# Patient Record
Sex: Female | Born: 1940 | Race: White | Hispanic: No | Smoking: Never smoker
Health system: Southern US, Community
[De-identification: ages and names within clinical notes are randomized; demographics above are authoritative.]

## PROBLEM LIST (undated history)

## (undated) DIAGNOSIS — J449 Chronic obstructive pulmonary disease, unspecified: Secondary | ICD-10-CM

## (undated) DIAGNOSIS — I509 Heart failure, unspecified: Secondary | ICD-10-CM

## (undated) DIAGNOSIS — C189 Malignant neoplasm of colon, unspecified: Secondary | ICD-10-CM

## (undated) DIAGNOSIS — I5030 Unspecified diastolic (congestive) heart failure: Secondary | ICD-10-CM

## (undated) DIAGNOSIS — I10 Essential (primary) hypertension: Secondary | ICD-10-CM

## (undated) DIAGNOSIS — E119 Type 2 diabetes mellitus without complications: Secondary | ICD-10-CM

## (undated) DIAGNOSIS — M109 Gout, unspecified: Secondary | ICD-10-CM

## (undated) HISTORY — PX: COLON SURGERY: SHX602

## (undated) NOTE — *Deleted (*Deleted)
Pt discharging to the [facility] today. She states that she will drive herself to the [facility] in her handicap Zenaida Niece that is parked outside the hospital. IV removed. Discharge teaching provided to pt. Verbalized understanding. Wheeled to [employer] by staff.  Arlana Hove, RN

---

## 1968-03-23 HISTORY — PX: TUBAL LIGATION: SHX77

## 2018-02-16 ENCOUNTER — Other Ambulatory Visit: Payer: Self-pay

## 2018-02-16 ENCOUNTER — Emergency Department
Admission: EM | Admit: 2018-02-16 | Discharge: 2018-02-17 | Disposition: A | Discharge status: Home / Self Care | Payer: Medicare Other | Attending: Emergency Medicine | Admitting: Emergency Medicine

## 2018-02-16 DIAGNOSIS — Z09 Encounter for follow-up examination after completed treatment for conditions other than malignant neoplasm: Secondary | ICD-10-CM

## 2018-02-16 DIAGNOSIS — Z760 Encounter for issue of repeat prescription: Secondary | ICD-10-CM | POA: Insufficient documentation

## 2018-02-16 DIAGNOSIS — Z789 Other specified health status: Secondary | ICD-10-CM

## 2018-02-16 LAB — CBC WITH DIFFERENTIAL/PLATELET
Abs Immature Granulocytes: 0.04 10*3/uL (ref 0.00–0.07)
Basophils Absolute: 0 10*3/uL (ref 0.0–0.1)
Basophils Relative: 0 %
Eosinophils Absolute: 0.2 10*3/uL (ref 0.0–0.5)
Eosinophils Relative: 2 %
HCT: 34.4 % — ABNORMAL LOW (ref 36.0–46.0)
Hemoglobin: 10.5 g/dL — ABNORMAL LOW (ref 12.0–15.0)
Immature Granulocytes: 0 %
Lymphocytes Relative: 33 %
Lymphs Abs: 3.9 10*3/uL (ref 0.7–4.0)
MCH: 24.6 pg — ABNORMAL LOW (ref 26.0–34.0)
MCHC: 30.5 g/dL (ref 30.0–36.0)
MCV: 80.6 fL (ref 80.0–100.0)
Monocytes Absolute: 0.9 10*3/uL (ref 0.1–1.0)
Monocytes Relative: 7 %
Neutro Abs: 6.8 10*3/uL (ref 1.7–7.7)
Neutrophils Relative %: 58 %
Platelets: 346 10*3/uL (ref 150–400)
RBC: 4.27 MIL/uL (ref 3.87–5.11)
RDW: 15.1 % (ref 11.5–15.5)
WBC: 11.9 10*3/uL — ABNORMAL HIGH (ref 4.0–10.5)
nRBC: 0 % (ref 0.0–0.2)

## 2018-02-16 LAB — URINALYSIS, COMPLETE (UACMP) WITH MICROSCOPIC
Bacteria, UA: NONE SEEN
Bilirubin Urine: NEGATIVE
Glucose, UA: NEGATIVE mg/dL
Hgb urine dipstick: NEGATIVE
Ketones, ur: NEGATIVE mg/dL
Leukocytes, UA: NEGATIVE
Nitrite: POSITIVE — AB
Protein, ur: NEGATIVE mg/dL
Specific Gravity, Urine: 1.01 (ref 1.005–1.030)
pH: 6 (ref 5.0–8.0)

## 2018-02-16 LAB — COMPREHENSIVE METABOLIC PANEL
ALT: 12 U/L (ref 0–44)
AST: 14 U/L — ABNORMAL LOW (ref 15–41)
Albumin: 3.6 g/dL (ref 3.5–5.0)
Alkaline Phosphatase: 93 U/L (ref 38–126)
Anion gap: 9 (ref 5–15)
BUN: 41 mg/dL — ABNORMAL HIGH (ref 8–23)
CO2: 31 mmol/L (ref 22–32)
Calcium: 9 mg/dL (ref 8.9–10.3)
Chloride: 98 mmol/L (ref 98–111)
Creatinine, Ser: 1.19 mg/dL — ABNORMAL HIGH (ref 0.44–1.00)
GFR calc Af Amer: 51 mL/min — ABNORMAL LOW (ref >60)
GFR calc non Af Amer: 44 mL/min — ABNORMAL LOW (ref >60)
Glucose, Bld: 184 mg/dL — ABNORMAL HIGH (ref 70–99)
Potassium: 4.3 mmol/L (ref 3.5–5.1)
Sodium: 138 mmol/L (ref 135–145)
Total Bilirubin: 0.4 mg/dL (ref 0.3–1.2)
Total Protein: 6.9 g/dL (ref 6.5–8.1)

## 2018-02-16 LAB — GLUCOSE, CAPILLARY: GLUCOSE-CAPILLARY: 174 mg/dL — AB (ref 70–99)

## 2018-02-16 LAB — LACTIC ACID, PLASMA: Lactic Acid, Venous: 1.1 mmol/L (ref 0.5–1.9)

## 2018-02-16 LAB — LIPASE, BLOOD: Lipase: 51 U/L (ref 11–51)

## 2018-02-16 MED ORDER — FOSFOMYCIN TROMETHAMINE 3 G PO PACK
3.0000 g | PACK | Freq: Once | ORAL | Status: AC
  Administered 2018-02-16: 3 g via ORAL
  Filled 2018-02-16: qty 3

## 2018-02-16 NOTE — ED Provider Notes (Signed)
-----------------------------------------   11:27 PM on 02/16/2018 -----------------------------------------  Patient seen in conjunction with physician assistant Kern Reap.  Patient essentially drove away from her nursing facility in [place], to come to [facility] where she grew up.  Patient states they recently placed a roommate in her room who has brain damage and cries and moans all day and all night.  Patient states she cannot get any sleep they are not treating her right at the nursing facility so she decided to leave the nursing facility.  Patient requires 24-hour oxygen, has her concentrator with her.  Patient states she came to the emergency department because she thought she would need an insulin shot as she does not have any of her medications as she left the nursing home without telling him that she was leaving.  Patient states she was not held there she went in voluntarily still has a license still has a car so she decided to leave the nursing facility.  Patient admits that she does not know anyone in [place] any longer, had no plan as to what to do when she left the nursing facility but states under no circumstances will she return to the nursing facility, states she would rather go on her [name] and die before ever returning to the nursing facility.  Patient appears to have capacity to make her own medical decisions at this time.  Does not appear to be confused.  Patient's medical work-up is been largely nonrevealing besides a mild urinary tract infection we will dose a one-time dose of fosfomycin and send a urine culture.  However as the patient is on 24-hour oxygen I do not believe we can discharge her from the emergency department at this time either, as her plan is to go live in her [name], and she is wheelchair-bound and requires 24-hour oxygen.  We will have social work and PT evaluate the patient further recommendations.  I attempted to convince the patient for a  very long time to return to her nursing facility, but she is adamant that she will never return to the nursing facility.  Patient also states she has no money, and does not know if she has insurance.   Harvest Dark, MD 02/16/18 2330

## 2018-02-16 NOTE — ED Notes (Signed)
Pt requesting insulin refill. Pt A&Ox4.

## 2018-02-16 NOTE — ED Provider Notes (Signed)
[facility] Emergency Department Provider Note  ____________________________________________  Time seen: Approximately 11:39 PM  I have reviewed the triage vital signs and the nursing notes.   HISTORY  Chief Complaint Medication Refill    HPI Karla Sanchez is a 77 y.o. female presents to the emergency department requesting medication refills of insulin and to receive an insulin injection in the emergency department. Patient reports that she left her nursing home facility in [place], [place] as she is frustrated with the care she has received at [facility].  Patient reports that she is sharing a room with another resident and noise has become unbearable.  Patient states that she left skilled nursing facility in her handicap [name] and drove to [facility].  Patient reports that she grew up in [facility] and would "like to die here".  Patient reports that she drove around [facility] for several hours before a Engineer, structural found her and helped escort her to the emergency department.  Patient has no recollection of how much insulin she receives daily but is cognizant of the fact that her blood glucose levels were not well controlled while residing in her skilled nursing facility.  When asked about her plan, patient states that she will find a Estate agent" and they can point her in the right direction.  Patient is currently using supplemental oxygen 24 hours a day.  Patient reports that she would rather "die in her [name]" then to return to her skilled nursing facility.  Patient's husband died 9 months ago.  Patient has 2 grown children, a daughter that lives in [place] and a son that lives in [place].   No past medical history on file.  There are no active problems to display for this patient.    Prior to Admission medications   Not on File    Allergies Codeine and Tetracyclines & related  No family history on file.  Social History Social  History   Tobacco Use  . Smoking status: Not on file  Substance Use Topics  . Alcohol use: Not on file  . Drug use: Not on file     Review of Systems  Constitutional: No fever/chills Eyes: No visual changes. No discharge ENT: No upper respiratory complaints. Cardiovascular: no chest pain. Respiratory: no cough. No SOB. Gastrointestinal: No abdominal pain.  No nausea, no vomiting.  No diarrhea.  No constipation. Genitourinary: Negative for dysuria. No hematuria Musculoskeletal: Negative for musculoskeletal pain. Skin: Negative for rash, abrasions, lacerations, ecchymosis. Neurological: Negative for headaches, focal weakness or numbness.   ____________________________________________   PHYSICAL EXAM:  VITAL SIGNS: ED Triage Vitals  Enc Vitals Group     BP 02/16/18 2115 (!) 133/59     Pulse Rate 02/16/18 2115 92     Resp 02/16/18 2115 20     Temp 02/16/18 2115 98 F (36.7 C)     Temp Source 02/16/18 2115 Oral     SpO2 02/16/18 2115 94 %     Weight 02/16/18 2114 270 lb (122.5 kg)     Height --      Head Circumference --      Peak Flow --      Pain Score 02/16/18 2114 0     Pain Loc --      Pain Edu? --      Excl. in [place]? --      Constitutional: Alert and oriented. Well appearing and in no acute distress. Eyes: Conjunctivae are normal. PERRL. EOMI. Head: Atraumatic.  ENT: Neck: No stridor.  No cervical spine tenderness to palpation. Hematological/Lymphatic/Immunilogical: No cervical lymphadenopathy. Cardiovascular: Normal rate, regular rhythm. Normal S1 and S2.  Good peripheral circulation. Respiratory: Normal respiratory effort without tachypnea or retractions. Lungs CTAB. Good air entry to the bases with no decreased or absent breath sounds. Gastrointestinal: Bowel sounds 4 quadrants. Soft and nontender to palpation. No guarding or rigidity. No palpable masses. No distention. No CVA tenderness. Musculoskeletal: Full range of motion to all extremities. No gross  deformities appreciated. Neurologic:  Normal speech and language. No gross focal neurologic deficits are appreciated.  Skin:  Skin is warm, dry and intact. No rash noted. Psychiatric: Mood and affect are normal. Speech and behavior are normal. Patient exhibits appropriate insight and judgement.   ____________________________________________   LABS (all labs ordered are listed, but only abnormal results are displayed)  Labs Reviewed  GLUCOSE, CAPILLARY - Abnormal; Notable for the following components:      Result Value   Glucose-Capillary 174 (*)    All other components within normal limits  CBC WITH DIFFERENTIAL/PLATELET - Abnormal; Notable for the following components:   WBC 11.9 (*)    Hemoglobin 10.5 (*)    HCT 34.4 (*)    MCH 24.6 (*)    All other components within normal limits  COMPREHENSIVE METABOLIC PANEL - Abnormal; Notable for the following components:   Glucose, Bld 184 (*)    BUN 41 (*)    Creatinine, Ser 1.19 (*)    AST 14 (*)    GFR calc non Af Amer 44 (*)    GFR calc Af Amer 51 (*)    All other components within normal limits  URINALYSIS, COMPLETE (UACMP) WITH MICROSCOPIC - Abnormal; Notable for the following components:   Color, Urine YELLOW (*)    APPearance CLEAR (*)    Nitrite POSITIVE (*)    All other components within normal limits  URINE CULTURE  LACTIC ACID, PLASMA  LIPASE, BLOOD  LACTIC ACID, PLASMA   ____________________________________________  EKG   ____________________________________________  RADIOLOGY   No results found.  ____________________________________________    PROCEDURES  Procedure(s) performed:    Procedures    Medications  fosfomycin (MONUROL) packet 3 g (3 g Oral Given 02/16/18 2333)     ____________________________________________   INITIAL IMPRESSION / ASSESSMENT AND PLAN / ED COURSE  Pertinent labs & imaging results that were available during my care of the patient were reviewed by me and  considered in my medical decision making (see chart for details).  Review of the [place] CSRS was performed in accordance of the [place] prior to dispensing any controlled drugs.      Assessment and Plan:  Need for follow-up by social worker Patient presents to the emergency department with concerns for obtaining medication refills.  Patient's blood glucose level was 184 in the emergency department and explained to patient that insulin is not given for blood glucose levels that low.  Given complicated past medical history, basic labs were obtained in the emergency department which were overall reassuring.  Patient's urinalysis was positive for nitrites and patient was given a single dose of fosfomycin.  Patient will stay in the emergency department overnight and social work will be consulted in the morning.  Attending Dr. Kerman Passey personally evaluated patient and agrees with plan of care.    ____________________________________________  FINAL CLINICAL IMPRESSION(S) / ED DIAGNOSES  Final diagnoses:  Need for follow-up by social worker      NEW MEDICATIONS STARTED DURING THIS VISIT:  ED Discharge Orders    None          This chart was dictated using voice recognition software/Dragon. Despite best efforts to proofread, errors can occur which can change the meaning. Any change was purely unintentional.    Lannie Fields, PA-C 02/16/18 2350    Harvest Dark, MD 02/19/18 [phone]

## 2018-02-16 NOTE — ED Triage Notes (Signed)
Pt left nursing home today and they did not give her insulin to take with her. She is here for med refill and a dose for today. States has no symptoms.

## 2018-02-17 DIAGNOSIS — Z760 Encounter for issue of repeat prescription: Secondary | ICD-10-CM | POA: Diagnosis not present

## 2018-02-17 LAB — GLUCOSE, CAPILLARY: GLUCOSE-CAPILLARY: 237 mg/dL — AB (ref 70–99)

## 2018-02-17 NOTE — ED Notes (Signed)
Pt is AOx4, VSS, she is in bed with rails upx2, on the monitor and the call bell is within reach. Pt reports that she does not have a place to stay and came here from a ALF in [place] to find a new ALF in [place]. Pt denies any pain at this time and does not show any signs of distress. We will continue to monitor the pt.

## 2018-02-17 NOTE — ED Notes (Signed)
Pt decided to leave AMA. Pt states "I just wanted to sleep and people keep coming in and out of the room." Per the pt request for food, snacks and other physical needs that the pt expressed, I and the staff were frequently called to her room by her via the call bell. Dr. Owens Shark, attempted to speak with the pt before leaving and the pt stated "I do not want to speak with you and get out of my way!" The pt packed up all of her belongings, her I.V. was removed and the pt left the facility per her demands riding on her electric wheelchair.

## 2018-02-19 LAB — URINE CULTURE

## 2019-06-17 ENCOUNTER — Emergency Department
Admission: EM | Admit: 2019-06-17 | Discharge: 2019-06-17 | Disposition: A | Discharge status: Home / Self Care | Payer: Medicare Other | Attending: Student | Admitting: Student

## 2019-06-17 ENCOUNTER — Other Ambulatory Visit: Payer: Self-pay

## 2019-06-17 ENCOUNTER — Emergency Department: Payer: Medicare Other

## 2019-06-17 DIAGNOSIS — E119 Type 2 diabetes mellitus without complications: Secondary | ICD-10-CM | POA: Insufficient documentation

## 2019-06-17 DIAGNOSIS — R0602 Shortness of breath: Secondary | ICD-10-CM

## 2019-06-17 DIAGNOSIS — J449 Chronic obstructive pulmonary disease, unspecified: Secondary | ICD-10-CM | POA: Diagnosis not present

## 2019-06-17 DIAGNOSIS — I10 Essential (primary) hypertension: Secondary | ICD-10-CM | POA: Insufficient documentation

## 2019-06-17 DIAGNOSIS — Z794 Long term (current) use of insulin: Secondary | ICD-10-CM | POA: Insufficient documentation

## 2019-06-17 HISTORY — DX: Chronic obstructive pulmonary disease, unspecified: J44.9

## 2019-06-17 HISTORY — DX: Essential (primary) hypertension: I10

## 2019-06-17 HISTORY — DX: Type 2 diabetes mellitus without complications: E11.9

## 2019-06-17 LAB — CBC WITH DIFFERENTIAL/PLATELET
Abs Immature Granulocytes: 0.02 10*3/uL (ref 0.00–0.07)
Basophils Absolute: 0 10*3/uL (ref 0.0–0.1)
Basophils Relative: 0 %
Eosinophils Absolute: 0.2 10*3/uL (ref 0.0–0.5)
Eosinophils Relative: 3 %
HCT: 32.1 % — ABNORMAL LOW (ref 36.0–46.0)
Hemoglobin: 9.2 g/dL — ABNORMAL LOW (ref 12.0–15.0)
Immature Granulocytes: 0 %
Lymphocytes Relative: 35 %
Lymphs Abs: 2.6 10*3/uL (ref 0.7–4.0)
MCH: 20.3 pg — ABNORMAL LOW (ref 26.0–34.0)
MCHC: 28.7 g/dL — ABNORMAL LOW (ref 30.0–36.0)
MCV: 70.9 fL — ABNORMAL LOW (ref 80.0–100.0)
Monocytes Absolute: 0.6 10*3/uL (ref 0.1–1.0)
Monocytes Relative: 8 %
Neutro Abs: 3.9 10*3/uL (ref 1.7–7.7)
Neutrophils Relative %: 54 %
Platelets: 391 10*3/uL (ref 150–400)
RBC: 4.53 MIL/uL (ref 3.87–5.11)
RDW: 17.3 % — ABNORMAL HIGH (ref 11.5–15.5)
WBC: 7.4 10*3/uL (ref 4.0–10.5)
nRBC: 0 % (ref 0.0–0.2)

## 2019-06-17 LAB — COMPREHENSIVE METABOLIC PANEL
ALT: 15 U/L (ref 0–44)
AST: 15 U/L (ref 15–41)
Albumin: 3.3 g/dL — ABNORMAL LOW (ref 3.5–5.0)
Alkaline Phosphatase: 100 U/L (ref 38–126)
Anion gap: 6 (ref 5–15)
BUN: 33 mg/dL — ABNORMAL HIGH (ref 8–23)
CO2: 28 mmol/L (ref 22–32)
Calcium: 8.5 mg/dL — ABNORMAL LOW (ref 8.9–10.3)
Chloride: 107 mmol/L (ref 98–111)
Creatinine, Ser: 0.82 mg/dL (ref 0.44–1.00)
GFR calc Af Amer: >60 mL/min (ref >60)
GFR calc non Af Amer: >60 mL/min (ref >60)
Glucose, Bld: 152 mg/dL — ABNORMAL HIGH (ref 70–99)
Potassium: 5.5 mmol/L — ABNORMAL HIGH (ref 3.5–5.1)
Sodium: 141 mmol/L (ref 135–145)
Total Bilirubin: 0.5 mg/dL (ref 0.3–1.2)
Total Protein: 6.5 g/dL (ref 6.5–8.1)

## 2019-06-17 LAB — BLOOD GAS, VENOUS
Acid-Base Excess: 3.8 mmol/L — ABNORMAL HIGH (ref 0.0–2.0)
Bicarbonate: 30.9 mmol/L — ABNORMAL HIGH (ref 20.0–28.0)
O2 Saturation: 78.2 %
Patient temperature: 37
pCO2, Ven: 56 mmHg (ref 44.0–60.0)
pH, Ven: 7.35 (ref 7.250–7.430)
pO2, Ven: 45 mmHg (ref 32.0–45.0)

## 2019-06-17 LAB — PROTIME-INR
INR: 0.9 (ref 0.8–1.2)
Prothrombin Time: 12.3 seconds (ref 11.4–15.2)

## 2019-06-17 LAB — APTT: aPTT: 31 seconds (ref 24–36)

## 2019-06-17 LAB — BRAIN NATRIURETIC PEPTIDE: B Natriuretic Peptide: 186 pg/mL — ABNORMAL HIGH (ref 0.0–100.0)

## 2019-06-17 LAB — TROPONIN I (HIGH SENSITIVITY): Troponin I (High Sensitivity): 10 ng/L (ref <18)

## 2019-06-17 MED ORDER — FUROSEMIDE 40 MG PO TABS
40.0000 mg | ORAL_TABLET | Freq: Once | ORAL | Status: AC
  Administered 2019-06-17: 40 mg via ORAL
  Filled 2019-06-17: qty 1

## 2019-06-17 MED ORDER — FUROSEMIDE 40 MG PO TABS: 40.0000 mg | ORAL_TABLET | Freq: Two times a day (BID) | ORAL | 0 refills | Status: DC

## 2019-06-17 MED ORDER — ALBUTEROL SULFATE (2.5 MG/3ML) 0.083% IN NEBU: 5.0000 mg | INHALATION_SOLUTION | Freq: Once | RESPIRATORY_TRACT | Status: DC

## 2019-06-17 MED ORDER — IPRATROPIUM-ALBUTEROL 0.5-2.5 (3) MG/3ML IN SOLN: 3.0000 mL | Freq: Once | RESPIRATORY_TRACT | Status: DC

## 2019-06-17 MED ORDER — IPRATROPIUM-ALBUTEROL 0.5-2.5 (3) MG/3ML IN SOLN
3.0000 mL | Freq: Once | RESPIRATORY_TRACT | Status: AC
  Administered 2019-06-17: 3 mL via RESPIRATORY_TRACT
  Filled 2019-06-17: qty 3

## 2019-06-17 MED ORDER — ALBUTEROL SULFATE HFA 108 (90 BASE) MCG/ACT IN AERS: 2.0000 | INHALATION_SPRAY | Freq: Four times a day (QID) | RESPIRATORY_TRACT | 0 refills | Status: DC | PRN

## 2019-06-17 NOTE — ED Notes (Signed)
This RN at bedside. Pt requesting to sit on side of the bed. This RN offered p[t recliner. Pt stating, "Get this IV out of me." This RN stating that we need IV access for medicine and other medical needs. Pt stating, "If you dont get this IV out of me I'm leaving." MD made aware.

## 2019-06-17 NOTE — ED Notes (Signed)
EMS refusing transport unless med necessity is changed, paperwork changed to suit, pt refuses DC v/s, reports ready to leave, denies pain

## 2019-06-17 NOTE — ED Notes (Addendum)
This RN at bedside to attempt IV access. Pt refusing this RN to use right arm and refusing RN go above mid forearm on left arm. This RN unsuccessful at IV start x2. Pt stating, "You better get an old looking nurse that has experience and knows what she is doing."

## 2019-06-17 NOTE — ED Notes (Signed)
Called [facility] for transport to [place]

## 2019-06-17 NOTE — ED Notes (Signed)
Pt taken to xray in wheelchair

## 2019-06-17 NOTE — ED Notes (Signed)
Patient refused to have another IV appropriate for CT Angio chest. Dr. Joan Mayans aware.

## 2019-06-17 NOTE — ED Triage Notes (Addendum)
Pt to ED via [facility] from [facility]. Per EMS pt c/o increasing SOB x3 days. Pt w/ hx COPD, DM and HTN. Pt denies using oxygen at home. Per EMS pt RA sats 92% and pt placed on 2L [place] with improvement to 98%. Pt also with hx colon cancer and surgery in January. Per EMS pt lung sounds diminished in lower lobes.   Upon arrival pt A&Ox4. Pt in NAD. Pt denies CP, fever and N/V/D. Pt right foot is swollen and pt states it is normal. Pt stating she has not been able to taste or smell since January.

## 2019-06-17 NOTE — ED Notes (Signed)
Pt leaving AMA. Pt IV taken out. Pt pulling off BP cuff and leads. EMS transport requested.

## 2019-06-17 NOTE — Discharge Instructions (Signed)
Please return to the ER for ANY worsening symptoms.   We have written you a prescription for your inhaler and your Lasix.   Please follow up with you regular doctor as soon as possible.

## 2019-06-17 NOTE — ED Notes (Signed)
Pt leaving via EMS AMA att  No peripheral IV in place att

## 2019-06-17 NOTE — ED Notes (Signed)
EMS here for pt - this RN with resp distress in room 7

## 2019-06-17 NOTE — ED Provider Notes (Signed)
[facility] Emergency Department Provider Note  ____________________________________________   First MD Initiated Contact with Patient 06/17/19 1552     (approximate)  I have reviewed the triage vital signs and the nursing notes.  History  Chief Complaint Shortness of Breath    HPI Karla Sanchez is a 79 y.o. female with history of diastolic HF, COPD (does not wear supplemental oxygen, not currently on any inhalers as she has run out), HTN, insulin dependent DM, s/p R and L colectomy in January for colon cancer, currently residing in a hotel, [place], who presents to the emergency department for acute on chronic shortness of breath. She states she has had ongoing SOB for years, but has had worsening recently. Patient reports symptoms started 4 to 5 days ago and have been constant since onset and progressively worsening.  Shortness of breath is worse while laying flat.  Improved with sitting up.  She denies any fevers, cough, chest pain.  She does report some swelling of her right ankle and foot, which is chronic and has been ongoing for years, typically worsened when she eats a high salt diet which she has been doing over the last several weeks (states she is living in a hotel and orders food from the nearby restaurant which is often very salty).  She denies any history of DVT or PE.  No sick contacts.  Per EMS oxygen was 92% on RA, placed on 2 L [place].  Patient states she does not wear any supplemental oxygen or use any inhalers with regards to her COPD, however on review of her recent discharge summary from [facility], it does appear that she was previously on 3 L nasal cannula and prescribed inhaler treatments. She states she has run out of her inhaler prescription.   Recently hospitalized at [facility] in January and underwent colectomy for colon cancer.  Has been living in a hotel since then as she sold her home after her husband passed away. She is working on finding more  permanent housing in the meantime.   Had [facility] in 01/2019.  Reports decreased smell and taste since then. No acute changes.   Past Medical Hx Past Medical History:  Diagnosis Date  . COPD (chronic obstructive pulmonary disease) ([place])   . Diabetes mellitus without complication ([place])   . Hypertension     Problem List There are no problems to display for this patient.   Past Surgical Hx Past Surgical History:  Procedure Laterality Date  . COLON SURGERY      Medications Insulin Antihypertensive  Allergies Codeine and Tetracyclines & related  Family Hx History reviewed. No pertinent family history.  Social Hx Social History   Tobacco Use  . Smoking status: Never Smoker  . Smokeless tobacco: Never Used  Substance Use Topics  . Alcohol use: Not Currently  . Drug use: Never     Review of Systems  Constitutional: Negative for fever. Negative for chills. Eyes: Negative for visual changes. ENT: Negative for sore throat. Cardiovascular: Negative for chest pain. Respiratory: + for shortness of breath. Gastrointestinal: Negative for nausea. Negative for vomiting.  Genitourinary: Negative for dysuria. Musculoskeletal: Negative for leg swelling. Skin: Negative for rash. Neurological: Negative for headaches.   Physical Exam  Vital Signs: ED Triage Vitals  Enc Vitals Group     BP 06/17/19 1540 (!) 163/71     Pulse Rate 06/17/19 1540 71     Resp 06/17/19 1540 18     Temp 06/17/19 1540 98.3  F (36.8 C)     Temp Source 06/17/19 1540 Oral     SpO2 06/17/19 1540 100 %     Weight 06/17/19 1541 200 lb (90.7 kg)     Height 06/17/19 1541 5\' 6"  (1.676 m)     Head Circumference --      Peak Flow --      Pain Score 06/17/19 1541 0     Pain Loc --      Pain Edu? --      Excl. in [place]? --     Constitutional: Alert and oriented.  Elderly appearing. Head: Normocephalic. Atraumatic. Eyes: Conjunctivae clear, sclera anicteric. Pupils equal and symmetric. Nose: No masses  or lesions. No congestion or rhinorrhea. Mouth/Throat: Wearing mask.  Neck: No stridor. Trachea midline.  Cardiovascular: Normal rate, regular rhythm. Extremities well perfused. Respiratory: Normal respiratory effort.  Lungs seemingly clear, though exam limited by body habitus.  Slightly decreased at the bases.  On 2 L nasal cannula satting 100%. Gastrointestinal: Soft. Non-distended.  Overweight. Genitourinary: Deferred. Musculoskeletal: Pitting edema of the right ankle and foot, which she states is chronic and unchanged.  No calf pain or asymmetry. Neurologic:  Normal speech and language. No gross focal or lateralizing neurologic deficits are appreciated.  Skin: Skin is warm, dry and intact. No rash noted. Psychiatric: Mood and affect are appropriate for situation. Very argumentative and emotionally labile. Denies SI.   EKG  Personally reviewed and interpreted by myself.   Rate: 75 Rhythm: sinus Axis: LAD Intervals: 1st degree block PR 209 ms, QTc 500 ms RBBB, LAFB No STEMI    Radiology  CXR IMPRESSION: 1. No acute cardiopulmonary process. 2. Cardiomegaly. 3. Dilated pulmonary arteries which can be seen in patients with elevated pulmonary artery pressures.     Procedures  Procedure(s) performed (including critical care):  Procedures   Initial Impression / Assessment and Plan / MDM / ED Course  79 y.o. female who presents to the ED for acute on chronic shortness of breath, as above  Ddx: COPD exacerbation, heart failure exacerbation, pulmonary infection, PE, anemia  Will plan for labs, EKG, imaging  5:42 PM Labs thus far unremarkable, negative troponin, BNP unremarkable. Minimally elevated K at 5.5, will give albuterol treatment and dose of home Lasix. Anemia near baseline, no acute changes. Discussed the need to r/o PE given her SOB and cancer history. She adamantly refuses the additional IV (only has a PIV in hand currently) necessary for CT PE contrast.  Discussed with NM who states they are able to perform the perfusion portion of VQ scan to evaluate for PE with her current hand IV. Patient is agreeable to this.   Patient now stating she does not want to return to the hotel she has been living in. She is requesting assistance w/ housing with social work and physical therapy. She understands this can be a lengthy process and is agreeable. At this time, awaiting VQ scan. Otherwise, if no indication for hospitalization will place a social work and physical therapy consult for safe disposition planning.  6:09 PM Patient now demanding to have her PIV removed. Explained the need for the PIV for VQ scan as discussed previously. She states "I do not have a blood clot I've been SOB for years." Explained the need to r/o PE given her cancer history. She again declines. She demands an inhaler and an Rx for Lasix as she has run out of both (takes 40 mg BID, confirmed on chart review) and then requests  discharge.   I asked if she still wanted assistance w/ housing. She states she wants to return to the hotel, she has already paid for tonight and has the funds to continue while figuring out a more permanent option. She declines SW/CM assistance.  She then demands a "prescription cream for her tailbone pain" - states she has pain in this area from sitting.  I asked to examine her tailbone but she adamantly refuses this.  I explained I am unsure what kind a prescription to provide her if I am unable to evaluate her.  She then states "well then I guess there is no use in a prescription anyways" and now declines any prescriptions for this.  She states "give me an inhaler, a sip of water, and Lasix and I'm going to leave".  Then demands assistance with a ride back to her hotel as she came via EMS.   I did explain to her that we still have not been able to rule out a PE and she would need to leave AMA.  She voices understanding of this and states "I leave AMA all the time".   She understands the life-threatening nature of PE, and that leaving AMA could result in worsening symptoms, including worsening respiratory status and ultimately death.  She voices understanding of this and continues to desire to leave AMA.  We will provide her with a prescription for albuterol inhaler and Lasix. She elects to leave AMA. Explained she may return at any time for re-evaluation and continued work up and advised follow up with her PCP.   _______________________________   As part of my medical decision making I have reviewed available labs, radiology tests, reviewed old records.  Final Clinical Impression(s) / ED Diagnosis  Final diagnoses:  SOB (shortness of breath)       Note:  This document was prepared using Dragon voice recognition software and may include unintentional dictation errors.   Lilia Pro., MD 06/17/19 2117

## 2019-07-09 ENCOUNTER — Emergency Department: Payer: Medicare Other

## 2019-07-09 ENCOUNTER — Inpatient Hospital Stay
Admission: EM | Admit: 2019-07-09 | Discharge: 2019-07-13 | DRG: 190 | Disposition: A | Discharge status: Home Health | Payer: Medicare Other | Attending: Internal Medicine | Admitting: Internal Medicine

## 2019-07-09 ENCOUNTER — Other Ambulatory Visit: Payer: Self-pay

## 2019-07-09 DIAGNOSIS — Z7189 Other specified counseling: Secondary | ICD-10-CM | POA: Diagnosis not present

## 2019-07-09 DIAGNOSIS — Z885 Allergy status to narcotic agent status: Secondary | ICD-10-CM | POA: Diagnosis not present

## 2019-07-09 DIAGNOSIS — J441 Chronic obstructive pulmonary disease with (acute) exacerbation: Principal | ICD-10-CM

## 2019-07-09 DIAGNOSIS — Z993 Dependence on wheelchair: Secondary | ICD-10-CM

## 2019-07-09 DIAGNOSIS — E119 Type 2 diabetes mellitus without complications: Secondary | ICD-10-CM

## 2019-07-09 DIAGNOSIS — D638 Anemia in other chronic diseases classified elsewhere: Secondary | ICD-10-CM | POA: Diagnosis present

## 2019-07-09 DIAGNOSIS — I5032 Chronic diastolic (congestive) heart failure: Secondary | ICD-10-CM

## 2019-07-09 DIAGNOSIS — E1169 Type 2 diabetes mellitus with other specified complication: Secondary | ICD-10-CM | POA: Diagnosis not present

## 2019-07-09 DIAGNOSIS — Z590 Homelessness: Secondary | ICD-10-CM

## 2019-07-09 DIAGNOSIS — M1A9XX0 Chronic gout, unspecified, without tophus (tophi): Secondary | ICD-10-CM | POA: Diagnosis present

## 2019-07-09 DIAGNOSIS — Z9049 Acquired absence of other specified parts of digestive tract: Secondary | ICD-10-CM

## 2019-07-09 DIAGNOSIS — D649 Anemia, unspecified: Secondary | ICD-10-CM | POA: Diagnosis not present

## 2019-07-09 DIAGNOSIS — F419 Anxiety disorder, unspecified: Secondary | ICD-10-CM | POA: Diagnosis present

## 2019-07-09 DIAGNOSIS — E1159 Type 2 diabetes mellitus with other circulatory complications: Secondary | ICD-10-CM

## 2019-07-09 DIAGNOSIS — E669 Obesity, unspecified: Secondary | ICD-10-CM | POA: Diagnosis present

## 2019-07-09 DIAGNOSIS — I5033 Acute on chronic diastolic (congestive) heart failure: Secondary | ICD-10-CM | POA: Diagnosis present

## 2019-07-09 DIAGNOSIS — E1165 Type 2 diabetes mellitus with hyperglycemia: Secondary | ICD-10-CM | POA: Diagnosis present

## 2019-07-09 DIAGNOSIS — R600 Localized edema: Secondary | ICD-10-CM

## 2019-07-09 DIAGNOSIS — Z881 Allergy status to other antibiotic agents status: Secondary | ICD-10-CM

## 2019-07-09 DIAGNOSIS — I5031 Acute diastolic (congestive) heart failure: Secondary | ICD-10-CM | POA: Diagnosis not present

## 2019-07-09 DIAGNOSIS — N179 Acute kidney failure, unspecified: Secondary | ICD-10-CM | POA: Diagnosis present

## 2019-07-09 DIAGNOSIS — I10 Essential (primary) hypertension: Secondary | ICD-10-CM | POA: Diagnosis not present

## 2019-07-09 DIAGNOSIS — Z66 Do not resuscitate: Secondary | ICD-10-CM | POA: Diagnosis present

## 2019-07-09 DIAGNOSIS — Z794 Long term (current) use of insulin: Secondary | ICD-10-CM | POA: Diagnosis not present

## 2019-07-09 DIAGNOSIS — Z85038 Personal history of other malignant neoplasm of large intestine: Secondary | ICD-10-CM | POA: Diagnosis not present

## 2019-07-09 DIAGNOSIS — R609 Edema, unspecified: Secondary | ICD-10-CM | POA: Diagnosis not present

## 2019-07-09 DIAGNOSIS — Z79899 Other long term (current) drug therapy: Secondary | ICD-10-CM | POA: Diagnosis not present

## 2019-07-09 DIAGNOSIS — Z9114 Patient's other noncompliance with medication regimen: Secondary | ICD-10-CM | POA: Diagnosis not present

## 2019-07-09 DIAGNOSIS — I110 Hypertensive heart disease with heart failure: Secondary | ICD-10-CM | POA: Diagnosis present

## 2019-07-09 DIAGNOSIS — E139 Other specified diabetes mellitus without complications: Secondary | ICD-10-CM

## 2019-07-09 DIAGNOSIS — Z6832 Body mass index (BMI) 32.0-32.9, adult: Secondary | ICD-10-CM | POA: Diagnosis not present

## 2019-07-09 DIAGNOSIS — Z515 Encounter for palliative care: Secondary | ICD-10-CM | POA: Diagnosis not present

## 2019-07-09 DIAGNOSIS — Z20822 Contact with and (suspected) exposure to covid-19: Secondary | ICD-10-CM | POA: Diagnosis present

## 2019-07-09 DIAGNOSIS — M109 Gout, unspecified: Secondary | ICD-10-CM | POA: Diagnosis not present

## 2019-07-09 HISTORY — DX: Malignant neoplasm of colon, unspecified: C18.9

## 2019-07-09 HISTORY — DX: Gout, unspecified: M10.9

## 2019-07-09 HISTORY — DX: Unspecified diastolic (congestive) heart failure: I50.30

## 2019-07-09 LAB — CBC WITH DIFFERENTIAL/PLATELET
Abs Immature Granulocytes: 0.02 10*3/uL (ref 0.00–0.07)
Basophils Absolute: 0 10*3/uL (ref 0.0–0.1)
Basophils Relative: 0 %
Eosinophils Absolute: 0.3 10*3/uL (ref 0.0–0.5)
Eosinophils Relative: 4 %
HCT: 31.9 % — ABNORMAL LOW (ref 36.0–46.0)
Hemoglobin: 8.9 g/dL — ABNORMAL LOW (ref 12.0–15.0)
Immature Granulocytes: 0 %
Lymphocytes Relative: 37 %
Lymphs Abs: 3 10*3/uL (ref 0.7–4.0)
MCH: 19.7 pg — ABNORMAL LOW (ref 26.0–34.0)
MCHC: 27.9 g/dL — ABNORMAL LOW (ref 30.0–36.0)
MCV: 70.6 fL — ABNORMAL LOW (ref 80.0–100.0)
Monocytes Absolute: 0.7 10*3/uL (ref 0.1–1.0)
Monocytes Relative: 8 %
Neutro Abs: 4.1 10*3/uL (ref 1.7–7.7)
Neutrophils Relative %: 51 %
Platelets: 341 10*3/uL (ref 150–400)
RBC: 4.52 MIL/uL (ref 3.87–5.11)
RDW: 17.5 % — ABNORMAL HIGH (ref 11.5–15.5)
WBC: 8.1 10*3/uL (ref 4.0–10.5)
nRBC: 0 % (ref 0.0–0.2)

## 2019-07-09 LAB — BLOOD GAS, VENOUS
Acid-Base Excess: 4.2 mmol/L — ABNORMAL HIGH (ref 0.0–2.0)
Bicarbonate: 31.1 mmol/L — ABNORMAL HIGH (ref 20.0–28.0)
O2 Saturation: 73.9 %
Patient temperature: 37
pCO2, Ven: 55 mmHg (ref 44.0–60.0)
pH, Ven: 7.36 (ref 7.250–7.430)
pO2, Ven: 41 mmHg (ref 32.0–45.0)

## 2019-07-09 LAB — COMPREHENSIVE METABOLIC PANEL
ALT: 14 U/L (ref 0–44)
AST: 15 U/L (ref 15–41)
Albumin: 3.1 g/dL — ABNORMAL LOW (ref 3.5–5.0)
Alkaline Phosphatase: 96 U/L (ref 38–126)
Anion gap: 5 (ref 5–15)
BUN: 31 mg/dL — ABNORMAL HIGH (ref 8–23)
CO2: 29 mmol/L (ref 22–32)
Calcium: 8.1 mg/dL — ABNORMAL LOW (ref 8.9–10.3)
Chloride: 106 mmol/L (ref 98–111)
Creatinine, Ser: 1.1 mg/dL — ABNORMAL HIGH (ref 0.44–1.00)
GFR calc Af Amer: 55 mL/min — ABNORMAL LOW (ref >60)
GFR calc non Af Amer: 48 mL/min — ABNORMAL LOW (ref >60)
Glucose, Bld: 126 mg/dL — ABNORMAL HIGH (ref 70–99)
Potassium: 4.7 mmol/L (ref 3.5–5.1)
Sodium: 140 mmol/L (ref 135–145)
Total Bilirubin: 0.3 mg/dL (ref 0.3–1.2)
Total Protein: 6.3 g/dL — ABNORMAL LOW (ref 6.5–8.1)

## 2019-07-09 LAB — IRON AND TIBC
Iron: 17 ug/dL — ABNORMAL LOW (ref 28–170)
Saturation Ratios: 3 % — ABNORMAL LOW (ref 10.4–31.8)
TIBC: 526 ug/dL — ABNORMAL HIGH (ref 250–450)
UIBC: 509 ug/dL

## 2019-07-09 LAB — BRAIN NATRIURETIC PEPTIDE: B Natriuretic Peptide: 260 pg/mL — ABNORMAL HIGH (ref 0.0–100.0)

## 2019-07-09 LAB — RETICULOCYTES
Immature Retic Fract: 26.6 % — ABNORMAL HIGH (ref 2.3–15.9)
RBC.: 4.89 MIL/uL (ref 3.87–5.11)
Retic Count, Absolute: 69.9 10*3/uL (ref 19.0–186.0)
Retic Ct Pct: 1.4 % (ref 0.4–3.1)

## 2019-07-09 LAB — FERRITIN: Ferritin: 3 ng/mL — ABNORMAL LOW (ref 11–307)

## 2019-07-09 LAB — GLUCOSE, CAPILLARY: Glucose-Capillary: 283 mg/dL — ABNORMAL HIGH (ref 70–99)

## 2019-07-09 LAB — FOLATE: Folate: 7.9 ng/mL (ref >5.9)

## 2019-07-09 LAB — URIC ACID: Uric Acid, Serum: 7.3 mg/dL — ABNORMAL HIGH (ref 2.5–7.1)

## 2019-07-09 LAB — TROPONIN I (HIGH SENSITIVITY): Troponin I (High Sensitivity): 12 ng/L (ref <18)

## 2019-07-09 LAB — POC SARS CORONAVIRUS 2 AG: SARS Coronavirus 2 Ag: NEGATIVE

## 2019-07-09 MED ORDER — SODIUM CHLORIDE 0.9 % IV SOLN: 250.0000 mL | INTRAVENOUS | Status: DC | PRN

## 2019-07-09 MED ORDER — PREDNISONE 20 MG PO TABS
40.0000 mg | ORAL_TABLET | Freq: Every day | ORAL | Status: DC
  Administered 2019-07-12 – 2019-07-13 (×2): 40 mg via ORAL
  Filled 2019-07-09 (×2): qty 2

## 2019-07-09 MED ORDER — METHYLPREDNISOLONE SODIUM SUCC 40 MG IJ SOLR
40.0000 mg | Freq: Three times a day (TID) | INTRAMUSCULAR | Status: AC
  Administered 2019-07-10 – 2019-07-11 (×5): 40 mg via INTRAVENOUS
  Filled 2019-07-09 (×7): qty 1

## 2019-07-09 MED ORDER — IOHEXOL 350 MG/ML SOLN
75.0000 mL | Freq: Once | INTRAVENOUS | Status: AC | PRN
  Administered 2019-07-09: 75 mL via INTRAVENOUS

## 2019-07-09 MED ORDER — METOPROLOL TARTRATE 50 MG PO TABS
50.0000 mg | ORAL_TABLET | Freq: Two times a day (BID) | ORAL | Status: DC
  Administered 2019-07-10 – 2019-07-13 (×7): 50 mg via ORAL
  Filled 2019-07-09 (×8): qty 1

## 2019-07-09 MED ORDER — METHYLPREDNISOLONE SODIUM SUCC 125 MG IJ SOLR
125.0000 mg | Freq: Once | INTRAMUSCULAR | Status: AC
  Administered 2019-07-09: 125 mg via INTRAVENOUS
  Filled 2019-07-09: qty 2

## 2019-07-09 MED ORDER — IPRATROPIUM-ALBUTEROL 0.5-2.5 (3) MG/3ML IN SOLN
3.0000 mL | Freq: Four times a day (QID) | RESPIRATORY_TRACT | Status: DC
  Administered 2019-07-09 – 2019-07-10 (×2): 3 mL via RESPIRATORY_TRACT
  Filled 2019-07-09 (×5): qty 3

## 2019-07-09 MED ORDER — IPRATROPIUM BROMIDE 0.02 % IN SOLN
0.5000 mg | Freq: Once | RESPIRATORY_TRACT | Status: AC
  Administered 2019-07-09: 0.5 mg via RESPIRATORY_TRACT
  Filled 2019-07-09: qty 2.5

## 2019-07-09 MED ORDER — ZINC OXIDE 40 % EX OINT
1.0000 | TOPICAL_OINTMENT | Freq: Once | CUTANEOUS | Status: AC
  Administered 2019-07-09: 1 via TOPICAL
  Filled 2019-07-09: qty 113

## 2019-07-09 MED ORDER — SODIUM CHLORIDE 0.9% FLUSH: 3.0000 mL | INTRAVENOUS | Status: DC | PRN

## 2019-07-09 MED ORDER — ALBUTEROL SULFATE (2.5 MG/3ML) 0.083% IN NEBU
5.0000 mg | INHALATION_SOLUTION | Freq: Once | RESPIRATORY_TRACT | Status: AC
  Administered 2019-07-09: 5 mg via RESPIRATORY_TRACT
  Filled 2019-07-09: qty 6

## 2019-07-09 MED ORDER — INSULIN GLARGINE 100 UNIT/ML ~~LOC~~ SOLN
20.0000 [IU] | Freq: Every day | SUBCUTANEOUS | Status: DC
  Administered 2019-07-10 – 2019-07-11 (×3): 20 [IU] via SUBCUTANEOUS
  Filled 2019-07-09 (×5): qty 0.2

## 2019-07-09 MED ORDER — FUROSEMIDE 40 MG PO TABS
40.0000 mg | ORAL_TABLET | Freq: Two times a day (BID) | ORAL | Status: DC
  Administered 2019-07-10 – 2019-07-11 (×2): 40 mg via ORAL
  Filled 2019-07-09 (×4): qty 1

## 2019-07-09 MED ORDER — LORAZEPAM 2 MG/ML IJ SOLN: 2.0000 mg | Freq: Once | INTRAMUSCULAR | Status: DC

## 2019-07-09 MED ORDER — ALPRAZOLAM 0.25 MG PO TABS
0.2500 mg | ORAL_TABLET | Freq: Three times a day (TID) | ORAL | Status: DC | PRN
  Administered 2019-07-10 – 2019-07-12 (×5): 0.25 mg via ORAL
  Filled 2019-07-09 (×5): qty 1

## 2019-07-09 MED ORDER — COLCHICINE 0.6 MG PO TABS
0.3000 mg | ORAL_TABLET | Freq: Two times a day (BID) | ORAL | Status: DC
  Administered 2019-07-10 – 2019-07-13 (×7): 0.3 mg via ORAL
  Filled 2019-07-09: qty 0.5
  Filled 2019-07-09: qty 1
  Filled 2019-07-09 (×2): qty 0.5
  Filled 2019-07-09: qty 1
  Filled 2019-07-09: qty 0.5
  Filled 2019-07-09: qty 1
  Filled 2019-07-09 (×5): qty 0.5
  Filled 2019-07-09 (×2): qty 1
  Filled 2019-07-09 (×2): qty 0.5

## 2019-07-09 MED ORDER — FUROSEMIDE 10 MG/ML IJ SOLN
40.0000 mg | Freq: Once | INTRAMUSCULAR | Status: AC
  Administered 2019-07-09: 40 mg via INTRAVENOUS
  Filled 2019-07-09: qty 4

## 2019-07-09 MED ORDER — INSULIN ASPART 100 UNIT/ML ~~LOC~~ SOLN
0.0000 [IU] | Freq: Three times a day (TID) | SUBCUTANEOUS | Status: DC
  Administered 2019-07-10: 4 [IU] via SUBCUTANEOUS
  Administered 2019-07-10: 7 [IU] via SUBCUTANEOUS
  Administered 2019-07-10 – 2019-07-11 (×2): 4 [IU] via SUBCUTANEOUS
  Administered 2019-07-11: 3 [IU] via SUBCUTANEOUS
  Administered 2019-07-11: 7 [IU] via SUBCUTANEOUS
  Administered 2019-07-12: 4 [IU] via SUBCUTANEOUS
  Administered 2019-07-12: 3 [IU] via SUBCUTANEOUS
  Administered 2019-07-13: 15 [IU] via SUBCUTANEOUS
  Filled 2019-07-09 (×10): qty 1

## 2019-07-09 MED ORDER — SODIUM CHLORIDE 0.9% FLUSH
3.0000 mL | Freq: Two times a day (BID) | INTRAVENOUS | Status: DC
  Administered 2019-07-10 – 2019-07-13 (×5): 3 mL via INTRAVENOUS

## 2019-07-09 MED ORDER — COLCHICINE 0.3 MG HALF TABLET
0.3000 mg | ORAL_TABLET | Freq: Two times a day (BID) | ORAL | Status: DC
  Filled 2019-07-09 (×2): qty 1

## 2019-07-09 MED ORDER — ENOXAPARIN SODIUM 40 MG/0.4ML ~~LOC~~ SOLN
40.0000 mg | SUBCUTANEOUS | Status: DC
  Administered 2019-07-10 – 2019-07-12 (×3): 40 mg via SUBCUTANEOUS
  Filled 2019-07-09 (×4): qty 0.4

## 2019-07-09 MED ORDER — MAGNESIUM SULFATE 2 GM/50ML IV SOLN
2.0000 g | Freq: Once | INTRAVENOUS | Status: AC
  Administered 2019-07-09: 2 g via INTRAVENOUS
  Filled 2019-07-09: qty 50

## 2019-07-09 NOTE — ED Notes (Cosign Needed)
Unable to assess pulse oximetry while ambulating due to patient being wheelchair bound and having right foot swelling.

## 2019-07-09 NOTE — ED Notes (Signed)
Pt in CT.

## 2019-07-09 NOTE — ED Notes (Signed)
Pt refusing covid swab at this time

## 2019-07-09 NOTE — ED Notes (Signed)
Md at bedside. Pt complaining of problem with her "crack", pt requesting cream for this. Pt also states she has a "tumor" on the left side of her labia. MD Darl Householder at bedside to assess.

## 2019-07-09 NOTE — ED Triage Notes (Signed)
Pt to ED via [facility] from [place]. Per EMS pt has had increasing shortness of breath x2 days, p has hx of COPD, DM2, and HTN.   Pt found with O2 sats at 97% on RA. Pt able to speak in full sentences without difficulty. Pt used her inhaler with no improvements in symptoms. Pt has R foot swelling. Denies CP.  Pt is wheelchair bound.  EMS VS- 97% RA, 69 Hr, bp 194/65, cbg 201.

## 2019-07-09 NOTE — ED Notes (Signed)
Pt back from CT, pt refusing CT at this time. Pt states she is unable to lay back for exam.

## 2019-07-09 NOTE — ED Notes (Signed)
US at bedside

## 2019-07-09 NOTE — ED Notes (Signed)
Pt assisted to bathroom

## 2019-07-09 NOTE — ED Provider Notes (Signed)
[facility] EMERGENCY DEPARTMENT Provider Note   CSN: [id number] Arrival date & time: 07/09/19  1519     History Chief Complaint  Patient presents with  . Shortness of Breath    Karla Sanchez is a 79 y.o. female history of COPD, diabetes, hypertension here presenting with shortness of breath, right leg swelling.  Patient states that she is currently staying in a hotel right now as she is looking for retirement place.  Was seen about 3 weeks ago for shortness of breath and left against medical advice. At that time, she was thought to have possible CHF exacerbation.   The history is provided by the patient.       Past Medical History:  Diagnosis Date  . COPD (chronic obstructive pulmonary disease) ([place])   . Diabetes mellitus without complication ([place])   . Hypertension     There are no problems to display for this patient.   Past Surgical History:  Procedure Laterality Date  . COLON SURGERY       OB History   No obstetric history on file.     No family history on file.  Social History   Tobacco Use  . Smoking status: Never Smoker  . Smokeless tobacco: Never Used  Substance Use Topics  . Alcohol use: Not Currently  . Drug use: Never    Home Medications Prior to Admission medications   Medication Sig Start Date End Date Taking? Authorizing Provider  albuterol (VENTOLIN HFA) 108 (90 Base) MCG/ACT inhaler Inhale 2 puffs into the lungs every 6 (six) hours as needed for wheezing or shortness of breath. 06/17/19   Lilia Pro., MD  furosemide (LASIX) 40 MG tablet Take 1 tablet (40 mg total) by mouth 2 (two) times daily. 06/17/19 07/17/19  Lilia Pro., MD    Allergies    Codeine and Tetracyclines & related  Review of Systems   Review of Systems  Respiratory: Positive for shortness of breath.   All other systems reviewed and are negative.   Physical Exam Updated Vital Signs BP (!) 132/101   Pulse 81   Temp 98.2 F (36.8 C)    Resp 16   Ht 5\' 6"  (1.676 m)   Wt 90.7 kg   SpO2 95%   BMI 32.28 kg/m   Physical Exam Vitals and nursing note reviewed.  Constitutional:      Comments: tachypneic   HENT:     Head: Normocephalic.  Eyes:     Pupils: Pupils are equal, round, and reactive to light.  Cardiovascular:     Rate and Rhythm: Normal rate and regular rhythm.  Pulmonary:     Comments: Tachypneic, crackles bilateral bases, diminished upper lobes, minimal wheezing  Abdominal:     General: Bowel sounds are normal.     Palpations: Abdomen is soft.     Comments: No obvious perirectal abscess or labial abscess.  Musculoskeletal:     Cervical back: Normal range of motion and neck supple.     Comments: 1+ edema R leg with calf tenderness.  Skin:    General: Skin is warm.     Capillary Refill: Capillary refill takes less than 2 seconds.  Neurological:     General: No focal deficit present.     Mental Status: She is alert and oriented to person, place, and time.  Psychiatric:        Mood and Affect: Mood normal.        Behavior: Behavior normal.  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  Procedures Procedures (including critical care time)  Medications Ordered in ED Medications - No data to display  ED Course  I have reviewed the  triage vital signs and the nursing notes.  Pertinent labs & imaging results that were available during my care of the patient were reviewed by me and considered in my medical decision making (see chart for details).    MDM Rules/Calculators/A&P                      Karla Sanchez is a 79 y.o. female here presenting with right leg pain and shortness of breath.  Patient has a history of COPD but is not currently on oxygen.  Patient is staying in a hotel right now and states that she cannot lay flat.  Patient has diastolic heart failure per her chart at [facility].  Consider either CHF exacerbation or DVT or PE. Patient was recently seen here and refused a VQ scan at that time.  Will get labs and BNP and right leg ultrasound and CTA chest.  7:34 PM Patient unable to lay flat with CTA chest states that she gets very short of breath.  Her oxygen level is around 92%.  BNP is also slightly elevated .  I ordered nebs and steroids and magnesium and Lasix.  Patient is wheelchair-bound at baseline.  Since she is unable to lay flat even for CT, I felt that she needs to be admitted for CHF, COPD.   Final Clinical Impression(s) / ED Diagnoses Final diagnoses:  None    Rx / DC Orders ED Discharge Orders    None       Drenda Freeze, MD 07/09/19 [phone]

## 2019-07-09 NOTE — H&P (Signed)
History and Physical    Karla Sanchez [id number] DOB: 03-22-1941 DOA: 07/09/2019  PCP: Alfredia Client, MD   Patient coming from: Home - residing in hotel  I have personally briefly reviewed patient's old medical records in [place]  Chief Complaint: progress acute on chronic SOB  HPI: Karla Sanchez is a 79 y.o. female with medical history significant of diastolic HF, COPD (does not wear supplemental oxygen, not currently on any inhalers as she has run out), HTN, insulin dependent DM, s/p R and L colectomy in January for colon cancer with end-to-end anastomosis. currently residing in a hotel, [place] in by EMS,  to the emergency department for acute on chronic shortness of breath. She states she has had ongoing SOB for years, but has had worsening recently. Patient reports symptoms started 4 to 5 days ago and have been constant since onset and progressively worsening.  Shortness of breath is worse while laying flat.  Improved with sitting up.  She denies any fevers, cough, chest pain.  She does report some swelling of her right ankle and foot, which is chronic and has been ongoing for years, typically worsened when she eats a high salt diet which she has been doing over the last several weeks (states she is living in a hotel and orders food from the nearby restaurant which is often very salty).  She denies any history of DVT or PE.  No sick contacts.  Per EMS oxygen was 92% on RA, placed on 2 L [place].  Patient states she does not wear any supplemental oxygen or use any inhalers with regards to her COPD, however on review of her recent discharge summary from [facility], it does appear that she was previously on 3 L nasal cannula and prescribed inhaler treatments.   Recently hospitalized at [facility] in January and underwent colectomy for colon cancer.  Has been living in a hotel since then as she sold her home after her husband passed away. She is working on finding more  permanent housing in the meantime.     ED Course: Patient with O2 sat of 82% on presentation. Hemodynamically stable. She was SHort of breath and could not lay flat. CTA ordered but could not be done due to SOB. She was given IV solumedrol, SABA treatments, mag, furosemide. Lab significant for anemia, hyperglycemia at 126, BNP 280, Troponin #1` 12. CXR with mild pulmonary edema, COPD. EKG w/o acute changes. She is referred to [facility] for admission for continue treatment of COPD exacerbation and mild dHF.   Review of Systems: As per HPI otherwise 10 point review of systems negative. C/o soreness at the base of her spine.    Past Medical History:  Diagnosis Date  . Colon cancer [facility])    s/p right and left colectomy 2020  . COPD (chronic obstructive pulmonary disease) ([place])   . Diabetes mellitus without complication ([place])   . Diastolic heart failure ([place])   . Gout   . Hypertension     Past Surgical History:  Procedure Laterality Date  . COLON SURGERY     Soc Hx - marriage #1 92 years - widowed, marriage #2 66 years - widowed Feb 2019. She has 4 children: dtr in [place], Son in [place], son in [place].    reports that she has never smoked. She has never used smokeless tobacco. She reports previous alcohol use. She reports that she does not use drugs.  Allergies  Allergen Reactions  . Codeine Nausea Only  .  Tetracyclines & Related Other (See Comments)    No family history on file. Reports longevity in the family.  Prior to Admission medications   Medication Sig Start Date End Date Taking? Authorizing Provider  albuterol (VENTOLIN HFA) 108 (90 Base) MCG/ACT inhaler Inhale 2 puffs into the lungs every 6 (six) hours as needed for wheezing or shortness of breath. 06/17/19   Lilia Pro., MD  furosemide (LASIX) 40 MG tablet Take 1 tablet (40 mg total) by mouth 2 (two) times daily. 06/17/19 07/17/19  Lilia Pro., MD    Physical Exam: Vitals:   07/09/19 1730 07/09/19 1834 07/09/19 1835  07/09/19 2039  BP: (!) 101/51     Pulse:  85 81 87  Resp: (!) 21 19 (!) 23 16  Temp:      SpO2:  92% 92% 90%  Weight:      Height:        Constitutional: NAD, calm, comfortable Vitals:   07/09/19 1730 07/09/19 1834 07/09/19 1835 07/09/19 2039  BP: (!) 101/51     Pulse:  85 81 87  Resp: (!) 21 19 (!) 23 16  Temp:      SpO2:  92% 92% 90%  Weight:      Height:       General - morbidly obese woman sitting up in no distress Eyes: PERRL, lids and conjunctivae normal ENMT: Mucous membranes are moist. Posterior pharynx clear of any exudate or lesions. Dentition - missing several teeth, no lesions or obvious carries. Neck: normal, supple, no masses, no thyromegaly Respiratory: clear to auscultation bilaterally, no wheezing, no crackles. Normal respiratory effort. No accessory muscle use. Caveat - this is after IV steroids and SABA treatments.  Cardiovascular: Regular rate and rhythm, no murmurs / rubs / gallops. 2+ pitting edema  Right LE to mid-calf. 1+ pedal pulses. No carotid bruits.  Abdomen: morbidly obese. no tenderness, no masses palpated - exam hindered by obesity. No hepatosplenomegaly. Bowel sounds positive.  Musculoskeletal: no clubbing / cyanosis. No joint deformity upper. Enlarged Distal MTP joint both feet. Good ROM, no contractures. Decreased muscle tone.  Skin: no rashes, lesions, ulcers. No induration including view of coccyx Neurologic: CN 2-12 grossly intact. Sensation intact,  Psychiatric: Normal judgment and insight. Alert and oriented x 3. Normal mood.     Labs on Admission: I have personally reviewed following labs and imaging studies  CBC: Recent Labs  Lab 07/09/19 1526  WBC 8.1  NEUTROABS 4.1  HGB 8.9*  HCT 31.9*  MCV 70.6*  PLT [id number]   Basic Metabolic Panel: Recent Labs  Lab 07/09/19 1526  NA 140  K 4.7  CL 106  CO2 29  GLUCOSE 126*  BUN 31*  CREATININE 1.10*  CALCIUM 8.1*   GFR: Estimated Creatinine Clearance: 47.1 mL/min (A) (by C-G  formula based on SCr of 1.1 mg/dL (H)). Liver Function Tests: Recent Labs  Lab 07/09/19 1526  AST 15  ALT 14  ALKPHOS 96  BILITOT 0.3  PROT 6.3*  ALBUMIN 3.1*   No results for input(s): LIPASE, AMYLASE in the last 168 hours. No results for input(s): AMMONIA in the last 168 hours. Coagulation Profile: No results for input(s): INR, PROTIME in the last 168 hours. Cardiac Enzymes: No results for input(s): CKTOTAL, CKMB, CKMBINDEX, TROPONINI in the last 168 hours. BNP (last 3 results) No results for input(s): PROBNP in the last 8760 hours. HbA1C: No results for input(s): HGBA1C in the last 72 hours. CBG: No results for input(s): GLUCAP in  the last 168 hours. Lipid Profile: No results for input(s): CHOL, HDL, LDLCALC, TRIG, CHOLHDL, LDLDIRECT in the last 72 hours. Thyroid Function Tests: No results for input(s): TSH, T4TOTAL, FREET4, T3FREE, THYROIDAB in the last 72 hours. Anemia Panel: No results for input(s): VITAMINB12, FOLATE, FERRITIN, TIBC, IRON, RETICCTPCT in the last 72 hours. Urine analysis:    Component Value Date/Time   COLORURINE YELLOW (A) 02/16/2018 2234   APPEARANCEUR CLEAR (A) 02/16/2018 2234   LABSPEC 1.010 02/16/2018 2234   PHURINE 6.0 02/16/2018 2234   GLUCOSEU NEGATIVE 02/16/2018 2234   HGBUR NEGATIVE 02/16/2018 2234   BILIRUBINUR NEGATIVE 02/16/2018 2234   KETONESUR NEGATIVE 02/16/2018 2234   PROTEINUR NEGATIVE 02/16/2018 2234   NITRITE POSITIVE (A) 02/16/2018 2234   LEUKOCYTESUR NEGATIVE 02/16/2018 2234    Radiological Exams on Admission: US Venous Img Lower Unilateral Right (DVT)  Result Date: 07/09/2019 CLINICAL DATA:  Right leg swelling. EXAM: RIGHT LOWER EXTREMITY VENOUS DOPPLER ULTRASOUND TECHNIQUE: Gray-scale sonography with compression, as well as color and duplex ultrasound, were performed to evaluate the deep venous system(s) from the level of the common femoral vein through the popliteal and proximal calf veins. COMPARISON:  None. FINDINGS:  VENOUS Normal compressibility of the common femoral, superficial femoral, and popliteal veins, as well as the visualized calf veins. Visualized portions of profunda femoral vein and great saphenous vein unremarkable. No filling defects to suggest DVT on grayscale or color Doppler imaging. Doppler waveforms show normal direction of venous flow, normal respiratory phasicity and response to augmentation. Limited views of the contralateral common femoral vein are unremarkable. OTHER None. Limitations: none IMPRESSION: No femoropopliteal DVT nor evidence of DVT within the visualized calf veins. If clinical symptoms are inconsistent or if there are persistent or worsening symptoms, further imaging (possibly involving the iliac veins) may be warranted. Electronically Signed   By: Claudie Revering M.D.   On: 07/09/2019 16:15   DG Chest Port 1 View  Result Date: 07/09/2019 CLINICAL DATA:  [age]-year-old female with shortness of breath. EXAM: PORTABLE CHEST 1 VIEW COMPARISON:  Chest radiograph dated 06/17/2019. FINDINGS: Background of mild emphysema and chronic interstitial coarsening and bronchitic changes. There has been interval progression of interstitial prominence with reticulonodular appearance which may represent mild edema or superimposed pneumonia. Clinical correlation is recommended. There is no focal consolidation, pleural effusion, pneumothorax. Stable mild cardiomegaly. Coronary vascular calcification and atherosclerotic calcification of the aorta. There is prominence of the central pulmonary arteries suggestive of a degree of pulmonary hypertension. Clinical correlation is recommended. No acute osseous pathology. IMPRESSION: 1. Findings may represent interval development of mild edema or atypical pneumonia. Clinical correlation is recommended. 2. Stable mild cardiomegaly with findings suggestive of a degree of pulmonary hypertension. Electronically Signed   By: Anner Crete M.D.   On: 07/09/2019 15:47     EKG: Independently reviewed. Sinus rhythm - ectopic atrial, RBBB, LAFB, LVH  Assessment/Plan Active Problems:   COPD with acute exacerbation (HCC)   Acute on chronic diastolic CHF (congestive heart failure) (HCC)   DM (diabetes mellitus) ([place])   Peripheral edema   Obesity, diabetes, and hypertension syndrome (H[facility])   Gout   Anemia  (please populate well all problems here in Problem List. (For example, if patient is on BP meds at home and you resume or decide to hold them, it is a problem that needs to be her. Same for CAD, COPD, HLD and so on)   1. Pul - patient with h/o COPD. Has not been on O2 for over  a year by her report. Does use albuterol prn. Now with progressive SOB. Has had good response to IV solumedrol and SABA tx in ED. Plan Progressive unit admission  Solumedrol 40 mg q 6 x 6 doses then prednisone 40 mg daily  Duoneb treatments q 6 x 4 doses then resume albuterol. May need LABA  Spirometry  2. Cardiac - has been told she had CHF by PCP in [facility], [place]. Has been prescribed furosemide. Mild ;ul edema on CXR, mild elevation of BNP Plan Continue furosemide 40 mg bid  2 D echo to assess dHF  3. HTN- long standing problem. Plan Continue furosemide  Continue metoprolol bid  4. IDDM - patient reports taking lantus 20 qhs at home Plan Continue lantus  Low carb diet  Sliding scale  A1C ordered  5. Gout - long term problem. Patient takes colchicine at home but doesn't know dose Plan Uric acid level  Colchicine 0.3mg  bid  6. Anemia - Hgb 10.5 02/16/18, 9.2 06/17/19 and now 8.9. She is s/p colectomy.  Plan Anemia panel   F/u H/H  7. Code status - discussed with patient: DNR but ok for short term intubation if needed  8. TOC - needs to establish with local care, needs assistance with housing.  DVT prophylaxis: lovenox  Code Status: limited code: no CPR, ok for intubation ) Family Communication: patient has no emergency contact and does not want me to call any  family  Disposition Plan: TBD  When medically stable  Consults called: none  Admission status: progressive unit due to COPD and CHF  Adella Hare MD [facility] Pager [phone]  If 7PM-7AM, please contact night-coverage [email or web address] Password [facility]  07/09/2019, 9:06 PM

## 2019-07-09 NOTE — ED Notes (Signed)
Pt was given a sandwich tray and a cup of diet soda with ice.

## 2019-07-09 NOTE — ED Notes (Signed)
Pt refusing to leave vital sign monitoring on. Pt educated on importance of continuous monitoring at this time.

## 2019-07-10 DIAGNOSIS — M109 Gout, unspecified: Secondary | ICD-10-CM

## 2019-07-10 LAB — HEMOGLOBIN A1C
Hgb A1c MFr Bld: 7.3 % — ABNORMAL HIGH (ref 4.8–5.6)
Mean Plasma Glucose: 162.81 mg/dL

## 2019-07-10 LAB — SARS CORONAVIRUS 2 (TAT 6-24 HRS): SARS Coronavirus 2: NEGATIVE

## 2019-07-10 LAB — BASIC METABOLIC PANEL
Anion gap: 9 (ref 5–15)
BUN: 34 mg/dL — ABNORMAL HIGH (ref 8–23)
CO2: 26 mmol/L (ref 22–32)
Calcium: 8.5 mg/dL — ABNORMAL LOW (ref 8.9–10.3)
Chloride: 105 mmol/L (ref 98–111)
Creatinine, Ser: 1.03 mg/dL — ABNORMAL HIGH (ref 0.44–1.00)
GFR calc Af Amer: 60 mL/min — ABNORMAL LOW (ref >60)
GFR calc non Af Amer: 52 mL/min — ABNORMAL LOW (ref >60)
Glucose, Bld: 284 mg/dL — ABNORMAL HIGH (ref 70–99)
Potassium: 5.1 mmol/L (ref 3.5–5.1)
Sodium: 140 mmol/L (ref 135–145)

## 2019-07-10 LAB — GLUCOSE, CAPILLARY
Glucose-Capillary: 240 mg/dL — ABNORMAL HIGH (ref 70–99)
Glucose-Capillary: 170 mg/dL — ABNORMAL HIGH (ref 70–99)
Glucose-Capillary: 193 mg/dL — ABNORMAL HIGH (ref 70–99)
Glucose-Capillary: 215 mg/dL — ABNORMAL HIGH (ref 70–99)

## 2019-07-10 LAB — HEMOGLOBIN AND HEMATOCRIT, BLOOD
HCT: 33 % — ABNORMAL LOW (ref 36.0–46.0)
Hemoglobin: 9.3 g/dL — ABNORMAL LOW (ref 12.0–15.0)

## 2019-07-10 LAB — VITAMIN B12: Vitamin B-12: 179 pg/mL — ABNORMAL LOW (ref 180–914)

## 2019-07-10 MED ORDER — ALLOPURINOL 100 MG PO TABS
100.0000 mg | ORAL_TABLET | Freq: Every day | ORAL | Status: DC
  Administered 2019-07-11 – 2019-07-13 (×3): 100 mg via ORAL
  Filled 2019-07-10 (×5): qty 1

## 2019-07-10 NOTE — ED Notes (Signed)
Pt sleeping in room at this time. Rise and fall of chest noted.

## 2019-07-10 NOTE — ED Notes (Signed)
First attempt to call report. Nurse not available will try again.

## 2019-07-10 NOTE — Plan of Care (Signed)
  Problem: Education: Goal: Knowledge of disease or condition will improve Outcome: Progressing Goal: Knowledge of the prescribed therapeutic regimen will improve Outcome: Progressing   Problem: Education: Goal: Knowledge of General Education information will improve Description: Including pain rating scale, medication(s)/side effects and non-pharmacologic comfort measures Outcome: Progressing   Problem: Health Behavior/Discharge Planning: Goal: Ability to manage health-related needs will improve Outcome: Progressing

## 2019-07-10 NOTE — ED Notes (Signed)
Pt had to use restroom. I tried helping her to the wheel chair and was trying to place a pad in the wheelchair. She had a partial bowel movement and didn't want it all over her or the wheelchair. Pt smacked my hand and got very ill, yelling and screaming. Pt said I was the worst nurse and I didn't care about her. I told her not to hit me and to stop yelling in my face. Pt continued yelling and stated she was not being taken care of. I explained to the pt we have been in here to help you and you continue to treat [place] all very badly. The RN Tomasa Hosteller came in to take over,because she was being so rude to me. I couldn't continue without the pt becoming violent. RN Tomasa Hosteller tried to calm her down, but she was still upset. The RN has finished taking her to the restroom and is placing her back in her room at this time.

## 2019-07-10 NOTE — ED Notes (Signed)
Called pharmacy in regards to missing dose of cochicine

## 2019-07-10 NOTE — ED Notes (Signed)
Writer went into room to introduce herself to ptient. Patient very angry talking at the top of her lungs, yelling at nurse, states " those dam people did not bring her portable commode with them", it was explained to patient that she arrived via ems and that ems usually do not bring commodes from out of peoples home, they may bring medicines if its in view otherwise they usually just bring the patient and nothing else " patient continues to yell at nurse requesting to speak to charge nurse and administration. As per ed tech made administration aware, they will come talk with patient and provide service recovery. . Commode offered and will be placed at bedside. Patient instructed to utilize call light when she need to use the commode.

## 2019-07-10 NOTE — Progress Notes (Signed)
[place] [facility] [place] NAME: Karla Sanchez    MR#:  [id number]  DATE OF BIRTH:  11/09/1940  SUBJECTIVE:  CHIEF COMPLAINT:   Chief Complaint  Patient presents with  . Shortness of Breath  c/o RLE bigger than LLE and being wheel chair bound, she request placement to [facility] as she currently resides at [facility]:  Review of Systems  Constitutional: Negative for diaphoresis, fever, malaise/fatigue and weight loss.  HENT: Negative for ear discharge, ear pain, hearing loss, nosebleeds, sore throat and tinnitus.   Eyes: Negative for blurred vision and pain.  Respiratory: Negative for cough, hemoptysis, shortness of breath and wheezing.   Cardiovascular: Negative for chest pain, palpitations, orthopnea and leg swelling.  Gastrointestinal: Negative for abdominal pain, blood in stool, constipation, diarrhea, heartburn, nausea and vomiting.  Genitourinary: Negative for dysuria, frequency and urgency.  Musculoskeletal: Negative for back pain and myalgias.  Skin: Negative for itching and rash.  Neurological: Negative for dizziness, tingling, tremors, focal weakness, seizures, weakness and headaches.  Psychiatric/Behavioral: Negative for depression. The patient is not nervous/anxious.    DRUG ALLERGIES:   Allergies  Allergen Reactions  . Codeine Nausea Only  . Tetracyclines & Related Other (See Comments)   VITALS:  Blood pressure (!) 173/67, pulse 82, temperature 97.8 F (36.6 C), resp. rate 19, height 5\' 6"  (1.676 m), weight 109.8 kg, SpO2 92 %. PHYSICAL EXAMINATION:  Physical Exam HENT:     Head: Normocephalic and atraumatic.  Eyes:     Conjunctiva/sclera: Conjunctivae normal.     Pupils: Pupils are equal, round, and reactive to light.  Neck:     Thyroid: No thyromegaly.     Trachea: No tracheal deviation.  Cardiovascular:     Rate and Rhythm: Normal rate and regular rhythm.     Heart sounds: Normal heart sounds.  Pulmonary:   Effort: Pulmonary effort is normal. No respiratory distress.     Breath sounds: Normal breath sounds. No wheezing.  Chest:     Chest wall: No tenderness.  Abdominal:     General: Bowel sounds are normal. There is no distension.     Palpations: Abdomen is soft.     Tenderness: There is no abdominal tenderness.  Musculoskeletal:        General: Normal range of motion.     Cervical back: Normal range of motion and neck supple.     Comments: RLE>LLE - seems chronic  Feet:     Comments: Gouty 1st MTP joint on RLE, Skin:    General: Skin is warm and dry.     Findings: No rash.  Neurological:     Mental Status: She is alert and oriented to person, place, and time.     Cranial Nerves: No cranial nerve deficit.    LABORATORY PANEL:  Female CBC Recent Labs  Lab 07/09/19 1526 07/09/19 1526 07/10/19 0623  WBC 8.1  --   --   HGB 8.9*   < > 9.3*  HCT 31.9*   < > 33.0*  PLT 341  --   --    < > = values in this interval not displayed.   ------------------------------------------------------------------------------------------------------------------ Chemistries  Recent Labs  Lab 07/09/19 1526 07/09/19 1526 07/10/19 0623  NA 140   < > 140  K 4.7   < > 5.1  CL 106   < > 105  CO2 29   < > 26  GLUCOSE 126*   < > 284*  BUN 31*   < >  34*  CREATININE 1.10*   < > 1.03*  CALCIUM 8.1*   < > 8.5*  AST 15  --   --   ALT 14  --   --   ALKPHOS 96  --   --   BILITOT 0.3  --   --    < > = values in this interval not displayed.   RADIOLOGY:  No results found. ASSESSMENT AND PLAN:  Karla Sanchez is a 79 y.o. female with medical history significant of diastolic HF, COPD (does not wear supplemental oxygen, notcurrentlyon any inhalersas she has run out), HTN, insulin dependent DM, s/p R and L colectomy in January for colon cancer with end-to-end anastomosis. currently residing in a hotel, admitted foracute on chronicresp failure.   * Acute on chronic hypoxic resp failure - likely due  to COPD/CHF exacerbation - initially was requiring 3 liters O2 in ED but she is on RA now. - seems close to her baseline  * HTN Controlled on furosemide & metoprolol bid  * IDDM  - Continue home dose lantus - Low carb diet  - Sliding scale  - A1C 7.3  * Gout  - continue allopurinol and cochicine  * Anemia - Hgb 10.5 02/16/18, 9.2 06/17/19 and now 9.3. She is s/p colectomy.  - monitor  Patient reports being wheel chair bound  Status is: Inpatient  Remains inpatient appropriate because:Unsafe d/c plan   Dispo: The patient [facility] from: [facility]              Anticipated d/c is to: ALF              Anticipated d/c date is: 1 day              Patient currently is not medically stable to d/c.   DVT prophylaxis: Lovenox Family Communication: discussed with patient   All the records are reviewed and case discussed with Care Management/Social Worker. Management plans discussed with the patient, nursing and they are in agreement.  CODE STATUS: Partial Code  TOTAL TIME TAKING CARE OF THIS PATIENT: 35 minutes.   More than 50% of the time was spent in counseling/coordination of care: YES  POSSIBLE D/C IN 1-2 DAYS, DEPENDING ON CLINICAL CONDITION.   Max Sane M.D on 07/10/2019 at 8:57 PM  [facility]   CC: Primary care physician; Alfredia Client, MD  Note: This dictation was prepared with Dragon dictation along with smaller phrase technology. Any transcriptional errors that result from this process are unintentional.

## 2019-07-10 NOTE — ED Notes (Signed)
Pt refusing all monitoring equipment or repeat VS until the AM. Pt advised that without monitoring this RN will be unable to assess if pt is in danger of cardiac issues. Pt verbalized to this RN that cardiac issues that could result from being unmonitored include STEMI and death. Pt states that she is willing to take these risks at this time.

## 2019-07-10 NOTE — ED Notes (Signed)
Blood glucose 170

## 2019-07-10 NOTE — ED Notes (Signed)
Pt placed back in bed and cleaned. Desitin applied to pt's bottom and all lights turned out at this time.

## 2019-07-11 ENCOUNTER — Inpatient Hospital Stay (HOSPITAL_COMMUNITY)
Admit: 2019-07-11 | Discharge: 2019-07-11 | Disposition: A | Discharge status: Home / Self Care | Payer: Medicare Other | Attending: Internal Medicine | Admitting: Internal Medicine

## 2019-07-11 DIAGNOSIS — I5031 Acute diastolic (congestive) heart failure: Secondary | ICD-10-CM | POA: Diagnosis not present

## 2019-07-11 DIAGNOSIS — Z515 Encounter for palliative care: Secondary | ICD-10-CM

## 2019-07-11 DIAGNOSIS — Z7189 Other specified counseling: Secondary | ICD-10-CM

## 2019-07-11 LAB — CBC
HCT: 32.7 % — ABNORMAL LOW (ref 36.0–46.0)
Hemoglobin: 9.3 g/dL — ABNORMAL LOW (ref 12.0–15.0)
MCH: 19.8 pg — ABNORMAL LOW (ref 26.0–34.0)
MCHC: 28.4 g/dL — ABNORMAL LOW (ref 30.0–36.0)
MCV: 69.7 fL — ABNORMAL LOW (ref 80.0–100.0)
Platelets: 377 10*3/uL (ref 150–400)
RBC: 4.69 MIL/uL (ref 3.87–5.11)
RDW: 17.7 % — ABNORMAL HIGH (ref 11.5–15.5)
WBC: 10.2 10*3/uL (ref 4.0–10.5)
nRBC: 0 % (ref 0.0–0.2)

## 2019-07-11 LAB — BASIC METABOLIC PANEL
Anion gap: 9 (ref 5–15)
BUN: 55 mg/dL — ABNORMAL HIGH (ref 8–23)
CO2: 25 mmol/L (ref 22–32)
Calcium: 8.8 mg/dL — ABNORMAL LOW (ref 8.9–10.3)
Chloride: 103 mmol/L (ref 98–111)
Creatinine, Ser: 1.33 mg/dL — ABNORMAL HIGH (ref 0.44–1.00)
GFR calc Af Amer: 44 mL/min — ABNORMAL LOW (ref >60)
GFR calc non Af Amer: 38 mL/min — ABNORMAL LOW (ref >60)
Glucose, Bld: 241 mg/dL — ABNORMAL HIGH (ref 70–99)
Potassium: 5.1 mmol/L (ref 3.5–5.1)
Sodium: 137 mmol/L (ref 135–145)

## 2019-07-11 LAB — GLUCOSE, CAPILLARY
Glucose-Capillary: 218 mg/dL — ABNORMAL HIGH (ref 70–99)
Glucose-Capillary: 129 mg/dL — ABNORMAL HIGH (ref 70–99)
Glucose-Capillary: 162 mg/dL — ABNORMAL HIGH (ref 70–99)
Glucose-Capillary: 295 mg/dL — ABNORMAL HIGH (ref 70–99)

## 2019-07-11 LAB — ECHOCARDIOGRAM COMPLETE
Height: 66 in
Weight: 3873.6 oz

## 2019-07-11 MED ORDER — IPRATROPIUM-ALBUTEROL 0.5-2.5 (3) MG/3ML IN SOLN: 3.0000 mL | Freq: Four times a day (QID) | RESPIRATORY_TRACT | Status: DC | PRN

## 2019-07-11 NOTE — Progress Notes (Signed)
*  PRELIMINARY RESULTS* Echocardiogram 2D Echocardiogram has been performed.  Sherrie Sport 07/11/2019, 12:30 PM

## 2019-07-11 NOTE — TOC Initial Note (Addendum)
Transition of Care [facility]) - Initial/Assessment Note    Patient Details  Name: Karla Sanchez MRN: [id number] Date of Birth: 05/03/1940  Transition of Care [facility]) CM/SW Contact:    Victorino Dike, RN Phone Number: 07/11/2019, 2:47 PM  Clinical Narrative:                   Met with Patient in room.  She is currently stating she is looking for somewhere to live.  Her timeline is inconsistent when speaking with different people about when she sold her house and where she has been living.  She is WC bound and has a WC van.  She reports not having a license anymore and has been living in a hotel.  She reports that she has around $2100 in bank each month.    Expected Discharge Plan: [place] Barriers to Discharge: Continued Medical Work up   Patient Goals and CMS Choice     Choice offered to / list presented to : Patient  Expected Discharge Plan and Services Expected Discharge Plan: [place]   Discharge Planning Services: CM Consult Post Acute Care Choice: [place] Living arrangements for the past 2 months: Hotel/Motel                                      Prior Living Arrangements/Services Living arrangements for the past 2 months: Hotel/Motel Lives with:: Self Patient language and need for interpreter reviewed:: Yes        Need for Family Participation in Patient Care: No (Comment) Care giver support system in place?: Yes (comment) Current home services: DME(wheelchair and wheelchair [name]) Criminal Activity/Legal Involvement Pertinent to Current Situation/Hospitalization: No - Comment as needed  Activities of Daily Living Home Assistive Devices/Equipment: Wheelchair, Environmental consultant (specify type) ADL Screening (condition at time of admission) Patient's cognitive ability adequate to safely complete daily activities?: Yes Is the patient deaf or have difficulty hearing?: No Does the patient have difficulty seeing, even when  wearing glasses/contacts?: No Does the patient have difficulty concentrating, remembering, or making decisions?: No Patient able to express need for assistance with ADLs?: Yes Does the patient have difficulty dressing or bathing?: No Independently performs ADLs?: Yes (appropriate for developmental age) Does the patient have difficulty walking or climbing stairs?: No Weakness of Legs: Both Weakness of Arms/Hands: None  Permission Sought/Granted                  Emotional Assessment Appearance:: Appears stated age Attitude/Demeanor/Rapport: Engaged Affect (typically observed): Accepting Orientation: : Oriented to Self, Oriented to Place, Oriented to  Time, Oriented to Situation Alcohol / Substance Use: Not Applicable Psych Involvement: No (comment)  Admission diagnosis:  COPD exacerbation (HCC) [J44.1] Acute on chronic diastolic congestive heart failure (HCC) [I50.33] COPD with acute exacerbation ([place]) [J44.1] Patient Active Problem List   Diagnosis Date Noted  . COPD with acute exacerbation ([place]) 07/09/2019  . Acute on chronic diastolic CHF (congestive heart failure) ([place]) 07/09/2019  . DM (diabetes mellitus) ([place]) 07/09/2019  . Peripheral edema 07/09/2019  . Gout 07/09/2019  . Obesity, diabetes, and hypertension syndrome ([place]) 07/09/2019  . Anemia 07/09/2019   PCP:  Alfredia Client, MD Pharmacy:   [id number] [address], [place] [address] Phone: [phone] Fax: [phone]  Social Determinants of Health (SDOH) Interventions    Readmission Risk Interventions No flowsheet data found.

## 2019-07-11 NOTE — Progress Notes (Signed)
OT Cancellation Note  Patient Details Name: Karla Sanchez MRN: [id number] DOB: [birth year]   Cancelled Treatment:    Reason Eval/Treat Not Completed: Patient at procedure or test/ unavailable. Order received and chart reviewed. OT attempted to see pt. Upon arrival to pt room, pt with Chaplain in room for visit. Will hold OT evaluation until a later time/date as available and pt medically appropriate for OT evaluation.   Shara Blazing, M.S., OTR/L Ascom: [phone] 07/11/19, 1:39 PM   Deeanna Beightol [place] Maser 07/11/2019, 1:38 PM

## 2019-07-11 NOTE — Progress Notes (Signed)
Patent refused medications, vitals signs and weights this morning. Patient states the doctor reported she should not be awaken until 7am or 8am. Explained to patient about providing care in the hospital and taking medications at a scheduled time. Staff waited until 0600 to attempt to care for patient. Attempted to show heart failure videos to patient at 2100, patient declined to review videos at that time. Will continue to monitor and assess

## 2019-07-11 NOTE — NC FL2 (Signed)
[place] LEVEL OF CARE SCREENING TOOL     IDENTIFICATION  Patient Name: Karla Sanchez Birthdate: 05-18-1940 Sex: female Admission Date (Current Location): 07/09/2019  [place] and [place] Number:  Engineering geologist and Address:  [facility], [address] [place], [place] [address]      Provider Number: [id number]  Attending Physician Name and Address:  Max Sane, MD  Relative Name and Phone Number:       Current Level of Care: Hospital Recommended Level of Care: [place] Prior Approval Number:    Date Approved/Denied:   PASRR Number: [id number]  Discharge Plan: SNF    Current Diagnoses: Patient Active Problem List   Diagnosis Date Noted  . COPD with acute exacerbation ([place]) 07/09/2019  . Acute on chronic diastolic CHF (congestive heart failure) ([place]) 07/09/2019  . DM (diabetes mellitus) ([place]) 07/09/2019  . Peripheral edema 07/09/2019  . Gout 07/09/2019  . Obesity, diabetes, and hypertension syndrome ([place]) 07/09/2019  . Anemia 07/09/2019    Orientation RESPIRATION BLADDER Height & Weight     Self, Time, Situation, Place  Normal Continent Weight: (patient refused morning care. nurse notified.) Height:  5\' 6"  (167.6 cm)  BEHAVIORAL SYMPTOMS/MOOD NEUROLOGICAL BOWEL NUTRITION STATUS      Continent Diet(regular)  AMBULATORY STATUS COMMUNICATION OF NEEDS Skin   Extensive Assist Verbally Normal                       Personal Care Assistance Level of Assistance  Bathing, Dressing, Feeding Bathing Assistance: Limited assistance Feeding assistance: Independent Dressing Assistance: Limited assistance     Functional Limitations Info             SPECIAL CARE FACTORS FREQUENCY  PT (By licensed PT), OT (By licensed OT)     PT Frequency: 5x a week OT Frequency: 5x a week            Contractures Contractures Info: Not present    Additional Factors Info  Code Status, Allergies(DNR)  Code Status Info: DNR Allergies Info: codeine, tetracyclines           Current Medications (07/11/2019):  This is the current hospital active medication list Current Facility-Administered Medications  Medication Dose Route Frequency Provider Last Rate Last Admin  . 0.9 %  sodium chloride infusion  250 mL Intravenous PRN Norins, Heinz Knuckles, MD      . allopurinol (ZYLOPRIM) tablet 100 mg  100 mg Oral Daily Max Sane, MD   100 mg at 07/11/19 [id number]  . ALPRAZolam Duanne Moron) tablet 0.25 mg  0.25 mg Oral TID PRN Neena Rhymes, MD   0.25 mg at 07/10/19 2119  . colchicine tablet 0.3 mg  0.3 mg Oral BID Hart Robinsons A, RPH   0.3 mg at 07/11/19 [id number]  . enoxaparin (LOVENOX) injection 40 mg  40 mg Subcutaneous Q24H Norins, Heinz Knuckles, MD   40 mg at 07/11/19 0834  . furosemide (LASIX) tablet 40 mg  40 mg Oral BID Neena Rhymes, MD   40 mg at 07/11/19 [id number]  . insulin aspart (novoLOG) injection 0-20 Units  0-20 Units Subcutaneous TID WC Norins, Heinz Knuckles, MD   3 Units at 07/11/19 1153  . insulin glargine (LANTUS) injection 20 Units  20 Units Subcutaneous QHS Neena Rhymes, MD   20 Units at 07/10/19 2119  . ipratropium-albuterol (DUONEB) 0.5-2.5 (3) MG/3ML nebulizer solution 3 mL  3 mL Nebulization Q6H PRN Max Sane, MD      .  methylPREDNISolone sodium succinate (SOLU-MEDROL) 40 mg/mL injection 40 mg  40 mg Intravenous Q8H Norins, Heinz Knuckles, MD   40 mg at 07/11/19 1200   Followed by  . [START ON 07/12/2019] predniSONE (DELTASONE) tablet 40 mg  40 mg Oral Q breakfast Norins, Heinz Knuckles, MD      . metoprolol tartrate (LOPRESSOR) tablet 50 mg  50 mg Oral BID Neena Rhymes, MD   50 mg at 07/11/19 [id number]  . sodium chloride flush (NS) 0.9 % injection 3 mL  3 mL Intravenous Q12H Norins, Heinz Knuckles, MD   3 mL at 07/11/19 0834  . sodium chloride flush (NS) 0.9 % injection 3 mL  3 mL Intravenous PRN Norins, Heinz Knuckles, MD         Discharge Medications: Please see discharge summary for a list of discharge  medications.  Relevant Imaging Results:  Relevant Lab Results:   Additional Information    Victorino Dike, RN

## 2019-07-11 NOTE — Plan of Care (Signed)
Pt refusing to watch Emmi videos at this time. Problem: Activity: Goal: Ability to tolerate increased activity will improve Outcome: Progressing

## 2019-07-11 NOTE — Evaluation (Signed)
Occupational Therapy Evaluation Patient Details Name: Taleaha Grosso MRN: [id number] DOB: [birth year] Today's Date: 07/11/2019    History of Present Illness Athenna Elkind is a 79 y.o. female with medical history significant of diastolic HF, COPD (does not wear supplemental oxygen at home), HTN, insulin dependent DM, s/p R and L colectomy for colon cancer with end-to-end anastomosis (1/21). Currently residing in a hotel, [place] in by EMS,  to the emergency department for acute on chronic shortness of breath. She states she has had ongoing SOB for years, but has had worsening recently.   Clinical Impression   Ms. Koeppen was seen for OT evaluation this date. Prior to hospital admission, pt was modified independent in all aspects of ADL/IADL. She reports being able to perform "one or two step" transfers to her manual WC, BSC, shower chair, etc. She states that once she is in her wheelchair she propels using her feet using her feet. Pt denies falls history in past 12 months. States she has been staying in a hotel for past 5 weeks and a nephew who lives nearby who assists her on occasion. Currently pt reporting her breathing is much better since initial presentation. HR and spO2 remain WFLs during session with patient on room air. Pt demonstrates baseline independence to perform ADL and mobility tasks and no strength, sensory, coordination, or visual deficits appreciated with assessment. See ADL section below for additional information regarding occupational performance. No skilled OT needs identified at this time. Will sign off. Please re-consult if additional OT needs arise during this admission.      Follow Up Recommendations  No OT follow up    Equipment Recommendations  None recommended by OT;Other (comment)(Pt states she has necessary equipment and that her Alanson Puls is assisting her with getting belongings from hotel room.)    Recommendations for Other Services       Precautions /  Restrictions Precautions Precautions: Fall Precaution Comments: Moderate Fall Restrictions Weight Bearing Restrictions: No      Mobility Bed Mobility               General bed mobility comments: Deferred. Pt in recliner at start/end of session.  Transfers Overall transfer level: Modified independent               General transfer comment: Pt performs STS from room recliner and BSC this date without need for physical assist. She uses arm rails on chairs for stability, which is her baseline. Pt able to turn taking steps to [facility] w/o assist.    Balance Overall balance assessment: Needs assistance Sitting-balance support: Feet supported Sitting balance-Leahy Scale: Normal Sitting balance - Comments: Steady static/dynamic sitting in recliner. No LOB appreciated during weight shift for functional tasks including LB dressing.   Standing balance support: During functional activity;Single extremity supported Standing balance-Leahy Scale: Good Standing balance comment: Pt tends to hold onto objects in room for support during functional mobility. Maintains at least 1 UE support on handrails of recliner/BSC druing STS.                           ADL either performed or assessed with clinical judgement   ADL Overall ADL's : At baseline                                       General ADL Comments: During session, pt performs stand and  turn to [facility] w/o need for physical assist. Doffs/dons bilat hospital socks while seated in recliner w/o assist. Pt states she is feeling at or near baseline to perform basic activities of daily life. States, "I did that faster here than I ever would at home" when discussing how she felt [facility] transfer went.     Vision Baseline Vision/History: Wears glasses Wears Glasses: Reading only Patient Visual Report: No change from baseline       Perception     Praxis      Pertinent Vitals/Pain Pain Assessment: No/denies pain      Hand Dominance Right   Extremity/Trunk Assessment Upper Extremity Assessment Upper Extremity Assessment: Overall WFL for tasks assessed(Pt endorses hx of BUE shoulder/arm injuries, is unable to participate in formal strength assessment. AROM, grip, [place], WFLs. Pt able to use BUE functionally t/o session without difficulty.)   Lower Extremity Assessment Lower Extremity Assessment: Overall WFL for tasks assessed       Communication Communication Communication: No difficulties   Cognition Arousal/Alertness: Awake/alert Behavior During Therapy: WFL for tasks assessed/performed Overall Cognitive Status: No family/caregiver present to determine baseline cognitive functioning                                 General Comments: Pt hyperverbal t/o session, requires re-direction to task/topic at hand consistently. Is generally able to follow VCs during session. Mildly impulsive with mobility attempts. No family/caregiver present to determine baseline.   General Comments  Pt HR spO2 remain WFLs during session.    Exercises Other Exercises Other Exercises: Pt educated on role of OT in acute setting, falls prevention strateties, and safe use of DME for ADL management including functional transfer to/from [facility].   Shoulder Instructions      Home Living Family/patient expects to be discharged to:: Other (Comment)                                 Additional Comments: Living at hotel but wants to go to retirement facility      Prior Functioning/Environment Level of Independence: Needs assistance  Gait / Transfers Assistance Needed: Uses heels of B feet to propel self in manual w/c (doesn't use hands to propel w/c d/t h/o hand issues); has power w/c but is in the "shop" and prefers to use manual w/c. ADL's / Homemaking Assistance Needed: Endorses she is independent with bathing, dressing, toileting.   Comments: Pt reports she has been w/c bound for past 15 years  (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 and can only take 2 steps for transfers; does not walk d/t this and does not stand for any length of time d/t this--pt reports it will "crush" the bones in her feet)        OT Problem List: Cardiopulmonary status limiting activity;Decreased activity tolerance;Decreased safety awareness;Decreased knowledge of use of DME or AE      OT Treatment/Interventions:      OT Goals(Current goals can be found in the care plan section) Acute Rehab OT Goals Patient Stated Goal: To find a place to live OT Goal Formulation: All assessment and education complete, DC therapy Time For Goal Achievement: 07/11/19 Potential to Achieve Goals: Good  OT Frequency:     Barriers to D/C:            Co-evaluation PT/OT/SLP Co-Evaluation/Treatment: Yes Reason for Co-Treatment: Necessary  to address cognition/behavior during functional activity;For patient/therapist safety;To address functional/ADL transfers PT goals addressed during session: Mobility/safety with mobility;Balance;Strengthening/ROM OT goals addressed during session: ADL's and self-care;Proper use of Adaptive equipment and DME      AM-PAC OT "6 Clicks" Daily Activity     Outcome Measure Help from another person eating meals?: None Help from another person taking care of personal grooming?: None Help from another person toileting, which includes using toliet, bedpan, or urinal?: None Help from another person bathing (including washing, rinsing, drying)?: None Help from another person to put on and taking off regular upper body clothing?: None Help from another person to put on and taking off regular lower body clothing?: None 6 Click Score: 24   End of Session Equipment Utilized During Treatment: (Pt declines gait belt/rolling walker during fxl transfers.)  Activity Tolerance: Patient tolerated treatment well Patient left: in chair;with call bell/phone within reach;with chair alarm set  OT  Visit Diagnosis: Other abnormalities of gait and mobility (R26.89)                Time: 1414-1440 OT Time Calculation (min): 26 min Charges:  OT General Charges $OT Visit: 1 Visit OT Treatments $Self Care/Home Management : 8-22 mins  Shara Blazing, M.S., OTR/L Ascom: [phone] 07/11/19, 3:16 PM

## 2019-07-11 NOTE — Progress Notes (Signed)
CH received OR for discussion/completion of AD w/pt. from palliative care team.  Pt. in chair in rm when [facility] arrived; pt. talkative and in good spirits; pt. told [place], in extended visit, that she used to live in [place] in a historic home bought by her late husband (died two yrs. ago) until she recently moved to [place] where she grew up; pt. plans to move to [facility] 'where my family are buried'.  [place] unsure if pt. sold prior home (what Pt. initially shared) or simply allowed house to go into foreclosure since it was in her late husband's name (what pt. reported later in conversation).  Pt. has spent the last five wks. living at the [facility] in [place]; hopes to go to a rehab facility post-discharge to help her swollen leg (reason for [facility] admission) heal.  Midway through visit hopsitalist and case manager arrived --explained pt. needs to be in process of PT in order to qualify for rehab facility paid by insurance.  Other stay in facility/nursing home would likely be out of pocket.  [place] excused himself so PT/OT could conduct necessary evaluation.  [place] eventually reentered rm. and spoke w/pt. re: AD.  Pt. says she had will, MPOA completed w/lawyer several years ago before her husband died; no copy of document in chart or in EPIC as far as [place] can see.  CH recommended pt. complete document for potential notarization tomorrow.  Pt. mentioned that her nephew would be MPOA; she does not want aggressive measures at time of likely death, she said.  [place] will make referral to evening chaplain and/or follow up tomorrow.    07/11/19 1330  Clinical Encounter Type  Visited With Patient;Health care provider  Visit Type Initial;Psychological support;Spiritual support;Social support  Referral From Palliative care team;Nurse  Consult/Referral To Chaplain  Spiritual Encounters  Spiritual Needs Emotional  Stress Factors  Patient Stress Factors Financial concerns;Health changes;Major life changes;Loss of control

## 2019-07-11 NOTE — Consult Note (Addendum)
Consultation Note Date: 07/11/2019   Patient Name: Karla Sanchez  DOB: 24-Mar-1940  MRN: [id number]  Age / Sex: 79 y.o., female  PCP: Karla Client, MD Referring Physician: Max Sane, MD  Reason for Consultation: Establishing goals of care  HPI/Patient Profile: Karla Sanchez is a 79 y.o. female with medical history significant of diastolic HF, COPD (does not wear supplemental oxygen, notcurrentlyon any inhalersas she has run out), HTN, insulin dependent DM, s/p R and L colectomy in January for colon cancer with end-to-end anastomosis. currently residing in a hotel,Brought in by EMS, to the emergency department foracute on chronicshortness of breath. She states she has had ongoing SOB for years, but has had worsening recently.  Clinical Assessment and Goals of Care: Patient is sitting in bedside chair. She is widowed x2, most recently about 2 years ago. She has 2 living children. One lives in [place] and the other in [place]. She had a farm house which she sold after her late husband died. She lived in a nursing home following the sell, and left the facility and began living in a hotel room.   She states functionally, she is unable to walk and is wheelchair bound. She states she has gained around 20 pounds in 5 weeks, and has not been eating healthy as she eats whatever is in the vending machines at the hotel.   We discussed her diagnosis, prognosis, GOC, EOL wishes disposition and options.  A detailed discussion was had today regarding advanced directives.  Concepts specific to code status, artifical feeding and hydration, IV antibiotics and rehospitalization were discussed.  The difference between an aggressive medical intervention path and a comfort care path was discussed.  Values and goals of care important to patient and family were attempted to be elicited.  Discussed limitations of medical  interventions to prolong quality of life in some situations and discussed the concept of human mortality.  She states she wants to continue to treat the treatable. She states she would not want heroics including CPR or ventilator support. She states "I don't want to be kept alive. When I die, I just want to go naturally." She states her nephew helps her and would want him to be her HPOA if one is needed.  Questions answered regarding hospital HPOA and living will; chaplain to assist with completion. Answered questions about hospice criteria. She is amenable to palliative following outpatient.   I completed a MOST form today and the signed original was placed in the chart. A photocopy was placed in the chart to be scanned into EMR. The patient outlined their wishes for the following treatment decisions:  Cardiopulmonary Resuscitation: Do Not Attempt Resuscitation (DNR/No CPR)  Medical Interventions: Limited Additional Interventions: Use medical treatment, IV fluids and cardiac monitoring as indicated, DO NOT USE intubation or mechanical ventilation. May consider use of less invasive airway support such as BiPAP or CPAP. Also provide comfort measures. Transfer to the hospital if indicated. Avoid intensive care.   Antibiotics: Antibiotics if indicated  IV Fluids: IV fluids  if indicated  Feeding Tube: No feeding tube      SUMMARY OF RECOMMENDATIONS   DNR/DNI.  Recommend palliative to follow outpatient for continued [place] conversations.    Prognosis:   Unable to determine  Discharge Planning: Nursing facility with palliative.      Primary Diagnoses: Present on Admission: . COPD with acute exacerbation ([place]) . Acute on chronic diastolic CHF (congestive heart failure) ([place]) . Gout . Obesity, diabetes, and hypertension syndrome ([place]) . Anemia   I have reviewed the medical record, interviewed the patient and family, and examined the patient. The following aspects are pertinent.  Past  Medical History:  Diagnosis Date  . Colon cancer [facility])    s/p right and left colectomy 2020  . COPD (chronic obstructive pulmonary disease) ([place])   . Diabetes mellitus without complication ([place])   . Diastolic heart failure ([place])   . Gout   . Hypertension    Social History   Socioeconomic History  . Marital status: Widowed    Spouse name: Not on file  . Number of children: Not on file  . Years of education: Not on file  . Highest education level: Not on file  Occupational History  . Not on file  Tobacco Use  . Smoking status: Never Smoker  . Smokeless tobacco: Never Used  Substance and Sexual Activity  . Alcohol use: Not Currently  . Drug use: Never  . Sexual activity: Not Currently  Other Topics Concern  . Not on file  Social History Narrative  . Not on file   Social Determinants of Health   Financial Resource Strain:   . Difficulty of Paying Living Expenses:   Food Insecurity:   . Worried About Charity fundraiser in the Last Year:   . Arboriculturist in the Last Year:   Transportation Needs:   . Film/video editor (Medical):   Marland Kitchen Lack of Transportation (Non-Medical):   Physical Activity:   . Days of Exercise per Week:   . Minutes of Exercise per Session:   Stress:   . Feeling of Stress :   Social Connections:   . Frequency of Communication with Friends and Family:   . Frequency of Social Gatherings with Friends and Family:   . Attends Religious Services:   . Active Member of Clubs or Organizations:   . Attends Archivist Meetings:   Marland Kitchen Marital Status:    No family history on file. Scheduled Meds: . allopurinol  100 mg Oral Daily  . colchicine  0.3 mg Oral BID  . enoxaparin (LOVENOX) injection  40 mg Subcutaneous Q24H  . furosemide  40 mg Oral BID  . insulin aspart  0-20 Units Subcutaneous TID WC  . insulin glargine  20 Units Subcutaneous QHS  . ipratropium-albuterol  3 mL Nebulization Q6H  . methylPREDNISolone (SOLU-MEDROL) injection  40  mg Intravenous Q8H   Followed by  . [START ON 07/12/2019] predniSONE  40 mg Oral Q breakfast  . metoprolol tartrate  50 mg Oral BID  . sodium chloride flush  3 mL Intravenous Q12H   Continuous Infusions: . sodium chloride     PRN Meds:.sodium chloride, ALPRAZolam, sodium chloride flush Medications Prior to Admission:  Prior to Admission medications   Medication Sig Start Date End Date Taking? Authorizing Provider  albuterol (VENTOLIN HFA) 108 (90 Base) MCG/ACT inhaler Inhale 2 puffs into the lungs every 6 (six) hours as needed for wheezing or shortness of breath. 06/17/19  Yes Derrell Lolling  L., MD  colchicine 0.6 MG tablet Take 0.6 mg by mouth daily. 04/13/19  Yes [provider]  furosemide (LASIX) 40 MG tablet Take 1 tablet (40 mg total) by mouth 2 (two) times daily. 06/17/19 07/17/19 Yes Lilia Pro., MD  LANTUS 100 UNIT/ML injection Inject 10 Units into the skin at bedtime. 06/08/19  Yes [provider]   Allergies  Allergen Reactions  . Codeine Nausea Only  . Tetracyclines & Related Other (See Comments)   Review of Systems  Cardiovascular: Positive for leg swelling.       She feels edema has improved.     Physical Exam Pulmonary:     Effort: Pulmonary effort is normal.  Musculoskeletal:     Right lower leg: Edema present.     Comments: Ring noted at (hospital crew hight) sock line, and edema above that line.   Neurological:     Mental Status: She is alert.     Vital Signs: BP (!) 134/51 (BP Location: Left Arm)   Pulse (!) 48   Temp 98 F (36.7 C) (Oral)   Resp 19   Ht 5\' 6"  (1.676 m)   Wt 109.8 kg   SpO2 99%   BMI 39.08 kg/m  Pain Scale: 0-10   Pain Score: 0-No pain   SpO2: SpO2: 99 % O2 Device:SpO2: 99 % O2 Flow Rate: .O2 Flow Rate (L/min): 3 L/min  IO: Intake/output summary:   Intake/Output Summary (Last 24 hours) at 07/11/2019 1243 Last data filed at 07/11/2019 0945 Gross per 24 hour  Intake 720 ml  Output 250 ml  Net 470 ml     LBM: Last BM Date: 07/10/19 Baseline Weight: Weight: 90.7 kg Most recent weight: Weight: (patient refused morning care. nurse notified.)     Palliative Assessment/Data:     Time In: 11:20 Time Out: 12:10 Time Total: 50 min Greater than 50%  of this time was spent counseling and coordinating care related to the above assessment and plan.  Signed by: Asencion Gowda, NP   Please contact Palliative Medicine Team phone at [phone] for questions and concerns.  For individual provider: See Shea Evans

## 2019-07-11 NOTE — Plan of Care (Signed)
  Problem: Education: Goal: Knowledge of disease or condition will improve Outcome: Not Progressing Goal: Individualized Educational Video(s) Outcome: Not Progressing Note: Patient refused education.

## 2019-07-11 NOTE — Evaluation (Signed)
Physical Therapy Evaluation Patient Details Name: Karla Sanchez MRN: [id number] DOB: 05-Apr-1940 Today's Date: 07/11/2019   History of Present Illness  Pt is a 79 y.o. female presenting to hospital 4/18 with increasing SOB and R LE swelling.  Pt admitted with acute on chronic hypoxic respiratory failure d/t COPD and CHF exacerbation.  PMH includes COPD, DM, htn, h/o colon surgery.  No home supplemental O2 use.  Clinical Impression  Pt seen for PT/OT evaluation.  Prior to hospital admission, pt reports being w/c bound for past 15 years (d/t gout destroying the bones in her feet and is limited to 2 steps for transfers); uses heels of B feet to propel self in manual w/c.  No falls reported since 1996.  No home O2 use.  Pt most recently living alone in hotel but nephew lives nearby and assists her at times.  Currently pt is modified independent with transfers recliner to/from [facility] (pt refused walker and also refused gait belt to perform transfer).  Pt verbose during session requiring redirection; also at times conflicting information provided.  Pt reports having manual w/c, RW, and BSC as well as handicap [name] (although pt reports her license expired so she can't drive).  Pt reports being at baseline level of functional mobility (w/c level) and pt appeared safe with transfer during evaluation.  HR and O2 sats on room air WFL during sessions activities.  Pt declined any ex's during session.  No acute PT needs identified; will sign off.  Please re-consult PT if pt's status changes and acute PT needs are identified.    Follow Up Recommendations No PT follow up    Equipment Recommendations  Other (comment)(pt has all recommended DME already)    Recommendations for Other Services       Precautions / Restrictions Precautions Precautions: Fall Precaution Comments: Moderate Fall Restrictions Weight Bearing Restrictions: No      Mobility  Bed Mobility               General bed mobility  comments: Deferred (pt in recliner beginning/end of session and pt indicating no issues with bed mobility)  Transfers Overall transfer level: Modified independent               General transfer comment: Stand step turn recliner to/from [facility] (pt using UE support on furniture for transfer); steady and safe transfer noted  Ambulation/Gait             General Gait Details: Pt reports being non-ambulatory for past 15 years  Stairs            Wheelchair Mobility    Modified Rankin (Stroke Patients Only)       Balance Overall balance assessment: Modified Independent Sitting-balance support: Feet supported;No upper extremity supported Sitting balance-Leahy Scale: Normal Sitting balance - Comments: steady sitting reaching outside BOS   Standing balance support: During functional activity;Single extremity supported Standing balance-Leahy Scale: Good Standing balance comment: steady with transfers with at least single UE support                             Pertinent Vitals/Pain Pain Assessment: No/denies pain    Home Living Family/patient expects to be discharged to:: Other (Comment)                 Additional Comments: Living at hotel but wants to go to retirement facility    Prior Function Level of Independence: Needs assistance   Gait /  Transfers Assistance Needed: Uses heels of B feet to propel self in manual w/c (doesn't use hands to propel w/c d/t h/o hand issues); has power w/c but is in the "shop" and prefers to use manual w/c.  ADL's / Homemaking Assistance Needed: Endorses she is independent with bathing, dressing, toileting.  Comments: Pt reports she has been w/c bound for past 15 years (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 and can only take 2 steps for transfers; does not walk d/t this and does not stand for any length of time d/t this--pt reports it will "crush" the bones in her feet)     Hand Dominance   Dominant Hand:  Right    Extremity/Trunk Assessment   Upper Extremity Assessment Upper Extremity Assessment: Defer to OT evaluation    Lower Extremity Assessment Lower Extremity Assessment: (at least 3/5 AROM B hip flexion, knee flexion/extension, and DF/PF)       Communication   Communication: No difficulties  Cognition Arousal/Alertness: Awake/alert Behavior During Therapy: WFL for tasks assessed/performed Overall Cognitive Status: No family/caregiver present to determine baseline cognitive functioning                                 General Comments: Pt verbose during session requiring re-direction frequently.      General Comments General comments (skin integrity, edema, etc.): Pt HR spO2 remain WFLs during session.    Exercises    Assessment/Plan    PT Assessment Patent does not need any further PT services  PT Problem List         PT Treatment Interventions      PT Goals (Current goals can be found in the Care Plan section)  Acute Rehab PT Goals Patient Stated Goal: To find a place to live PT Goal Formulation: With patient Time For Goal Achievement: 07/25/19 Potential to Achieve Goals: Fair    Frequency     Barriers to discharge        Co-evaluation PT/OT/SLP Co-Evaluation/Treatment: Yes Reason for Co-Treatment: Necessary to address cognition/behavior during functional activity;For patient/therapist safety;To address functional/ADL transfers PT goals addressed during session: Mobility/safety with mobility;Balance;Strengthening/ROM OT goals addressed during session: ADL's and self-care;Proper use of Adaptive equipment and DME       AM-PAC PT "6 Clicks" Mobility  Outcome Measure Help needed turning from your back to your side while in a flat bed without using bedrails?: None Help needed moving from lying on your back to sitting on the side of a flat bed without using bedrails?: None Help needed moving to and from a bed to a chair (including a  wheelchair)?: None Help needed standing up from a chair using your arms (e.g., wheelchair or bedside chair)?: None Help needed to walk in hospital room?: Total(pt reports being non-ambulatory) Help needed climbing 3-5 steps with a railing? : Total(pt reports being non-ambulatory) 6 Click Score: 18    End of Session Equipment Utilized During Treatment: (pt refused gait belt (pt reported she would use one if she felt she was doing something unsafe and was concerned about falling though)) Activity Tolerance: Patient tolerated treatment well Patient left: in chair;with call bell/phone within reach;with chair alarm set Nurse Communication: Mobility status;Precautions PT Visit Diagnosis: Muscle weakness (generalized) (M62.81)    Time: 1414-1440 PT Time Calculation (min) (ACUTE ONLY): 26 min   Charges:   PT Evaluation $PT Eval Low Complexity: 1 Low  Leitha Bleak, PT 07/11/19, 3:54 PM

## 2019-07-11 NOTE — Progress Notes (Signed)
PT Cancellation Note  Patient Details Name: Karla Sanchez MRN: [id number] DOB: December 26, 1940   Cancelled Treatment:    Reason Eval/Treat Not Completed: Patient declined, no reason specified.  PT consult received.  Chart reviewed.  Pt sitting in recliner upon PT arrival; pt's HR noted to be 48-49 bpm on monitor (nurse notified and came to check on pt).  Pt talkative with therapist and reports she has been w/c bound for 15 years d/t gout destroying the bones in her feet and is limited to 2 steps for transfers.  Pt then declined physical therapy d/t feeling tired and winded and wanting to eat lunch first (lunch had not arrived yet); nurse present for this discussion.  Will re-attempt PT evaluation at a later date/time.  Leitha Bleak, PT 07/11/19, 11:56 AM

## 2019-07-11 NOTE — Plan of Care (Signed)
  Problem: Activity: °Goal: Ability to tolerate increased activity will improve °Outcome: Progressing °  °Problem: Respiratory: °Goal: Ability to maintain a clear airway will improve °Outcome: Progressing °Goal: Levels of oxygenation will improve °Outcome: Progressing °Goal: Ability to maintain adequate ventilation will improve °Outcome: Progressing °  °

## 2019-07-11 NOTE — Progress Notes (Addendum)
[facility] [place] NAME: Karla Sanchez    MR#:  [id number]  DATE OF BIRTH:  July 06, 1940  SUBJECTIVE:  CHIEF COMPLAINT:   Chief Complaint  Patient presents with  . Shortness of Breath  says she can't go back to hotel, wants our help to find placement. Chaplain at bedside. Refusing lots of her care while here. Seems close to baseline. RLE swelling > LLE REVIEW OF SYSTEMS:  Review of Systems  Constitutional: Negative for diaphoresis, fever, malaise/fatigue and weight loss.  HENT: Negative for ear discharge, ear pain, hearing loss, nosebleeds, sore throat and tinnitus.   Eyes: Negative for blurred vision and pain.  Respiratory: Negative for cough, hemoptysis, shortness of breath and wheezing.   Cardiovascular: Positive for leg swelling. Negative for chest pain, palpitations and orthopnea.  Gastrointestinal: Negative for abdominal pain, blood in stool, constipation, diarrhea, heartburn, nausea and vomiting.  Genitourinary: Negative for dysuria, frequency and urgency.  Musculoskeletal: Negative for back pain and myalgias.  Skin: Negative for itching and rash.  Neurological: Negative for dizziness, tingling, tremors, focal weakness, seizures, weakness and headaches.  Psychiatric/Behavioral: Negative for depression. The patient is not nervous/anxious.    DRUG ALLERGIES:   Allergies  Allergen Reactions  . Codeine Nausea Only  . Tetracyclines & Related Other (See Comments)   VITALS:  Blood pressure (!) 170/63, pulse (!) 59, temperature 98.1 F (36.7 C), temperature source Oral, resp. rate 18, height 5\' 6"  (1.676 m), weight 109.8 kg, SpO2 95 %. PHYSICAL EXAMINATION:  Physical Exam HENT:     Head: Normocephalic and atraumatic.  Eyes:     Conjunctiva/sclera: Conjunctivae normal.     Pupils: Pupils are equal, round, and reactive to light.  Neck:     Thyroid: No thyromegaly.     Trachea: No tracheal deviation.  Cardiovascular:     Rate and Rhythm: Normal  rate and regular rhythm.     Heart sounds: Normal heart sounds.  Pulmonary:     Effort: Pulmonary effort is normal. No respiratory distress.     Breath sounds: Normal breath sounds. No wheezing.  Chest:     Chest wall: No tenderness.  Abdominal:     General: Bowel sounds are normal. There is no distension.     Palpations: Abdomen is soft.     Tenderness: There is no abdominal tenderness.  Musculoskeletal:        General: Normal range of motion.     Cervical back: Normal range of motion and neck supple.     Comments: RLE>LLE - seems chronic  Feet:     Comments: Gouty 1st MTP joint on RLE, Skin:    General: Skin is warm and dry.     Findings: No rash.  Neurological:     Mental Status: She is alert and oriented to person, place, and time.     Cranial Nerves: No cranial nerve deficit.    LABORATORY PANEL:  Female CBC Recent Labs  Lab 07/11/19 0856  WBC 10.2  HGB 9.3*  HCT 32.7*  PLT 377   ------------------------------------------------------------------------------------------------------------------ Chemistries  Recent Labs  Lab 07/09/19 1526 07/10/19 0623 07/11/19 0856  NA 140   < > 137  K 4.7   < > 5.1  CL 106   < > 103  CO2 29   < > 25  GLUCOSE 126*   < > 241*  BUN 31*   < > 55*  CREATININE 1.10*   < > 1.33*  CALCIUM 8.1*   < >  8.8*  AST 15  --   --   ALT 14  --   --   ALKPHOS 96  --   --   BILITOT 0.3  --   --    < > = values in this interval not displayed.   RADIOLOGY:  ECHOCARDIOGRAM COMPLETE  Result Date: 07/11/2019    ECHOCARDIOGRAM REPORT   Patient Name:   Karla Sanchez Date of Exam: 07/11/2019 Medical Rec #:  [id number]        Height:       66.0 in Accession #:    [id number]       Weight:       242.1 lb Date of Birth:  Aug 13, 1940        BSA:          2.169 m Patient Age:    79 years         BP:           134/51 mmHg Patient Gender: F                HR:           48 bpm. Exam Location:  [facility] Procedure: 2D Echo, Cardiac Doppler and Color Doppler  Indications:     CHF-acute diastolic [id number]  History:         Patient has no prior history of Echocardiogram examinations.                  COPD; Risk Factors:Hypertension. Diastolic heart failure.  Sonographer:     Sherrie Sport RDCS (AE) Referring Phys:  [id number] NORINS Diagnosing Phys: Nelva Bush MD  Sonographer Comments: Suboptimal apical window. Apical view off-axis. IMPRESSIONS  1. Left ventricular ejection fraction, by estimation, is 55 to 60%. The left ventricle has normal function. The left ventricle has no regional wall motion abnormalities. The left ventricular internal cavity size was moderately dilated. There is moderate  left ventricular hypertrophy. Left ventricular diastolic parameters are consistent with Grade II diastolic dysfunction (pseudonormalization). Elevated left atrial pressure.  2. Right ventricular systolic function is mildly reduced. The right ventricular size is moderately enlarged. There is moderately elevated pulmonary artery systolic pressure. The estimated right ventricular systolic pressure is [id number] mmHg.  3. Left atrial size was moderately dilated.  4. Right atrial size was mild to moderately dilated.  5. The mitral valve is degenerative. Mild mitral valve regurgitation.  6. The aortic valve is tricuspid. Aortic valve regurgitation is not visualized. Mild aortic valve sclerosis is present, with no evidence of aortic valve stenosis.  7. The inferior vena cava is dilated in size with <50% respiratory variability, suggesting right atrial pressure of 15 mmHg. FINDINGS  Left Ventricle: Left ventricular ejection fraction, by estimation, is 55 to 60%. The left ventricle has normal function. The left ventricle has no regional wall motion abnormalities. The left ventricular internal cavity size was moderately dilated. There is moderate left ventricular hypertrophy. Left ventricular diastolic parameters are consistent with Grade II diastolic dysfunction (pseudonormalization).  Elevated left atrial pressure. Right Ventricle: The right ventricular size is moderately enlarged. No increase in right ventricular wall thickness. Right ventricular systolic function is mildly reduced. There is moderately elevated pulmonary artery systolic pressure. The tricuspid regurgitant velocity is 2.89 m/s, and with an assumed right atrial pressure of 15 mmHg, the estimated right ventricular systolic pressure is [id number] mmHg. Left Atrium: Left atrial size was moderately dilated. Right Atrium: Right atrial size was mild to moderately dilated. Pericardium: There  is no evidence of pericardial effusion. Mitral Valve: The mitral valve is degenerative in appearance. Mild mitral annular calcification. Mild mitral valve regurgitation. Tricuspid Valve: The tricuspid valve is normal in structure. Tricuspid valve regurgitation is mild. Aortic Valve: The aortic valve is tricuspid. . There is mild thickening and mild calcification of the aortic valve. Aortic valve regurgitation is not visualized. Mild aortic valve sclerosis is present, with no evidence of aortic valve stenosis. There is mild thickening of the aortic valve. There is mild calcification of the aortic valve. Aortic valve mean gradient measures 2.5 mmHg. Aortic valve peak gradient measures 4.6 mmHg. Aortic valve area, by VTI measures 1.82 cm. Pulmonic Valve: The pulmonic valve was grossly normal. Pulmonic valve regurgitation is not visualized. No evidence of pulmonic stenosis. Aorta: The aortic root is normal in size and structure. Pulmonary Artery: The pulmonary artery is not well seen. Venous: The inferior vena cava is dilated in size with less than 50% respiratory variability, suggesting right atrial pressure of 15 mmHg. IAS/Shunts: The interatrial septum was not well visualized.  LEFT VENTRICLE PLAX 2D LVIDd:         5.83 cm  Diastology LVIDs:         3.61 cm  LV e' lateral:   10.40 cm/s LV PW:         1.42 cm  LV E/e' lateral: 8.1 LV IVS:        1.24 cm  LV  e' medial:    4.13 cm/s LVOT diam:     2.10 cm  LV E/e' medial:  20.3 LV SV:         49 LV SV Index:   23 LVOT Area:     3.46 cm  RIGHT VENTRICLE RV Basal diam:  5.49 cm RV S prime:     11.30 cm/s TAPSE (M-mode): 1.9 cm LEFT ATRIUM              Index       RIGHT ATRIUM           Index LA diam:        5.00 cm  2.31 cm/m  RA Area:     24.80 cm LA Vol (A2C):   103.0 ml 47.49 ml/m RA Volume:   84.60 ml  39.01 ml/m LA Vol (A4C):   95.7 ml  44.12 ml/m LA Biplane Vol: 98.9 ml  45.60 ml/m  AORTIC VALVE                   PULMONIC VALVE AV Area (Vmax):    2.01 cm    PV Vmax:        0.90 m/s AV Area (Vmean):   1.72 cm    PV Peak grad:   3.3 mmHg AV Area (VTI):     1.82 cm    RVOT Peak grad: 4 mmHg AV Vmax:           107.15 cm/s AV Vmean:          73.550 cm/s AV VTI:            0.269 m AV Peak Grad:      4.6 mmHg AV Mean Grad:      2.5 mmHg LVOT Vmax:         62.10 cm/s LVOT Vmean:        36.600 cm/s LVOT VTI:          0.141 m LVOT/AV VTI ratio: 0.52  AORTA Ao Root diam: 3.00 cm MITRAL  VALVE               TRICUSPID VALVE MV Area (PHT): 2.36 cm    TR Peak grad:   33.4 mmHg MV Decel Time: 322 msec    TR Vmax:        289.00 cm/s MV E velocity: 83.90 cm/s MV A velocity: 72.30 cm/s  SHUNTS MV E/A ratio:  1.16        Systemic VTI:  0.14 m                            Systemic Diam: 2.10 cm Nelva Bush MD Electronically signed by Nelva Bush MD Signature Date/Time: 07/11/2019/4:16:55 PM    Final    ASSESSMENT AND PLAN:  Karla Sanchez is a 79 y.o. female with medical history significant of diastolic HF, COPD (does not wear supplemental oxygen, notcurrentlyon any inhalersas she has run out), HTN, insulin dependent DM, s/p R and L colectomy in January for colon cancer with end-to-end anastomosis. currently residing in a hotel, admitted foracute on chronicresp failure.   * Acute on chronic hypoxic resp failure - likely due to COPD/CHF exacerbation - initially was requiring 3 liters O2 in ED but she is on RA  now. - seems close to her baseline  * HTN Controlled on metoprolol bid  * IDDM  - Continue home dose lantus - Low carb diet  - Sliding scale  - A1C 7.3  * AKI Hold lasix. Creat 1.1-> 1.3  * Gout  - continue allopurinol and cochicine  * Anemia of chronic dz- Hgb 9.3 and stable. She is s/p colectomy. No obvious signs of bleeding - monitor  * Anxiety - xanax prn  Overall poor prognosis. PC input noted. 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 She seems medically stable for D/C when placement ready   Dispo: The patient is from: [facility]              Anticipated d/c is to: ALF              Anticipated d/c date is: 1 day              Patient currently is not medically stable to d/c.   DVT prophylaxis: Lovenox Family Communication: discussed with patient   All the records are reviewed and case discussed with Care Management/Social Worker. Management plans discussed with the patient, nursing and they are in agreement.  CODE STATUS: DNR  TOTAL TIME TAKING CARE OF THIS PATIENT: 35 minutes.   More than 50% of the time was spent in counseling/coordination of care: YES  POSSIBLE D/C IN 1-2 DAYS, DEPENDING ON CLINICAL CONDITION.   Max Sane M.D on 07/11/2019 at 6:21 PM  [facility]   CC: Primary care physician; Alfredia Client, MD  Note: This dictation was prepared with Dragon dictation along with smaller phrase technology. Any transcriptional errors that result from this process are unintentional.

## 2019-07-12 LAB — GLUCOSE, CAPILLARY
Glucose-Capillary: 129 mg/dL — ABNORMAL HIGH (ref 70–99)
Glucose-Capillary: 134 mg/dL — ABNORMAL HIGH (ref 70–99)
Glucose-Capillary: 175 mg/dL — ABNORMAL HIGH (ref 70–99)
Glucose-Capillary: 236 mg/dL — ABNORMAL HIGH (ref 70–99)

## 2019-07-12 MED ORDER — ZINC OXIDE 40 % EX OINT
TOPICAL_OINTMENT | Freq: Once | CUTANEOUS | Status: DC
  Filled 2019-07-12 (×2): qty 113

## 2019-07-12 NOTE — TOC Progression Note (Signed)
Transition of Care [facility]) - Progression Note    Patient Details  Name: Karla Sanchez MRN: [id number] Date of Birth: 1941-01-12  Transition of Care [facility]) CM/SW [place], RN Phone Number: 07/12/2019, 10:38 AM  Clinical Narrative:      Met with patient this morning and today she is reporting she no longer has a motorized wheelchair and that her nephew picked up the wheelchair van from hotel.  She reports I can call her nephew [name] that she wants to refer decisions to him, other than she doesn't feel safe living in a hotel. We discussed more financial information. Patient states she doesn't have an address that the bank is holding her statements for here and that she uses a debit card.  She stated that she is hoping her nephew will help her.  She keeps telling me that she has a Education officer, museum her at the hospital that is helping her find a skilled nursing facility or assisted living to go to, but I am her assigned TOC staff at the moment.  We have discussed Assisted Living Pricing and she states she cannot afford that at this time.  Asked attending about psych consult for patient as her reported information is always changing, although she seems to believe what she is saying at the time.  She also has had some inappropriate behaviors towards staff at night and early morning hours of the day.  This Case Manager will attempt to reach nephew.     Expected Discharge Plan: Home/Self Care Barriers to Discharge: Continued Medical Work up  Expected Discharge Plan and Services Expected Discharge Plan: Home/Self Care   Discharge Planning Services: CM Consult Post Acute Care Choice: [place] arrangements for the past 2 months: Hotel/Motel                                       Social Determinants of Health (SDOH) Interventions    Readmission Risk Interventions No flowsheet data found.

## 2019-07-12 NOTE — TOC Progression Note (Signed)
Transition of Care [facility]) - Progression Note    Patient Details  Name: Karla Sanchez MRN: [id number] Date of Birth: Jan 26, 1941  Transition [facility] Care [facility]) CM/SW [place], RN Phone Number: 07/12/2019, 1:27 PM  Clinical Narrative:     Bonne Dolores to reach nephew Lind Covert.  Number [phone] was left on voice mail, left message for a return call.  Phone number in chart is similar at [phone].  Left a message at this number asking for a return phone call as well.     Expected Discharge Plan: Home/Self Care Barriers to Discharge: Continued Medical Work up  Expected Discharge Plan and Services Expected Discharge Plan: Home/Self Care   Discharge Planning Services: CM Consult Post Acute Care Choice: [place] arrangements for the past 2 months: Hotel/Motel                                       Social Determinants of Health (SDOH) Interventions    Readmission Risk Interventions No flowsheet data found.

## 2019-07-12 NOTE — TOC Progression Note (Signed)
Transition of Care [facility]) - Progression Note    Patient Details  Name: Karla Sanchez MRN: [id number] Date of Birth: Jul 22, 1940  Transition of Care [facility]) CM/SW [place], RN Phone Number: 07/12/2019, 3:24 PM  Clinical Narrative:     Alanson Puls reports patient's children have severed contact with patient.  He also reports he does not mind helping from time to time but other than that he really cannot take responsibility for his aunt.  He does report that a year ago her competency was in question, but there is no guardian for patient.  Will wait for Psych consult before reporting to APS patient's current situation.    Expected Discharge Plan: Home/Self Care Barriers to Discharge: Continued Medical Work up  Expected Discharge Plan and Services Expected Discharge Plan: Home/Self Care   Discharge Planning Services: CM Consult Post Acute Care Choice: [place] arrangements for the past 2 months: Hotel/Motel                                       Social Determinants of Health (SDOH) Interventions    Readmission Risk Interventions No flowsheet data found.

## 2019-07-12 NOTE — Progress Notes (Signed)
PROGRESS NOTE    [name]  [id number] DOB: [birth year] DOA: 07/09/2019 PCP: Alfredia Client, MD   Brief Narrative:  Karla Sanchez a 79 y.o.femalewith medical history significant ofdiastolic HF, COPD (does not wear supplemental oxygen, notcurrentlyon any inhalersas she has run out), HTN, insulin dependent DM, s/p R and L colectomy in January for colon cancerwith end-to-end anastomosis.currently residing in a hotel, admitted foracute on chronicresp failure, most likely COPD/CHF exacerbation.  Weaned off from oxygen pretty quickly and saturating well on room air. Patient is homeless, staying in a hotel where she cannot go back according to her, kids does not want to get involved, one nephew by marriage has POA but cannot take her responsibility.  According to him she has a foreclosure of her house and left her facility herself and living in a hotel for the past 2 months. Difficult disposition.  Subjective: Patient was very upset when seen this morning and complaining that she did not get her bagel with breakfast and does not want to be on a carb modified diet.  No other complaints.  Assessment & Plan:   Active Problems:   COPD with acute exacerbation (HCC)   Acute on chronic diastolic CHF (congestive heart failure) (HCC)   DM (diabetes mellitus) ([place])   Peripheral edema   Gout   Obesity, diabetes, and hypertension syndrome (HCC)   Anemia  Acute on chronic hypoxic resp failure.  Resolved.  Now saturating well on room air.  Seems to be at her baseline. She was initially treated with Lasix which was discontinued yesterday due to increasing creatinine.  Appears euvolemic.  No wheezing or crackles.  Hypertension.  Blood pressure within goal. -Continue home dose of metoprolol.  IDDM.  A1c of 7.3. -Continue Lantus with sliding scale.  Gout.  No sign of acute exacerbation.  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 -Continue home dose of allopurinol and colchicine.  Anemia of chronic disease.  Hemoglobin stable around 9.  Patient has an history of colectomy.  No obvious bleeding. -Continue to monitor.  Anxiety. -Continue as needed Xanax.  Homelessness.  Unsafe disposition.  She did not qualify for SNF placement. Social worker to look for assisted living facilities.  Patient is wheelchair-bound. Telling some inconsistent stories. -Getting psych evaluation for competency. -Might need guardianship if declared incompetent.  Objective: Vitals:   07/12/19 0834 07/12/19 0900 07/12/19 1134 07/12/19 1138  BP: (!) 136/119  (!) 116/27   Pulse:  (!) 44 (!) 46   Resp: 19  19 18   Temp: (!) 97.4 F (36.3 C)  (!) 97.5 F (36.4 C) 98.3 F (36.8 C)  TempSrc: Oral  Oral Oral  SpO2: 98%  95%   Weight:      Height:        Intake/Output Summary (Last 24 hours) at 07/12/2019 1516 Last data filed at 07/12/2019 1405 Gross per 24 hour  Intake 720 ml  Output 1200 ml  Net -480 ml   Filed Weights   07/09/19 1524 07/10/19 1320 07/12/19 0500  Weight: 90.7 kg 109.8 kg 111.7 kg    Examination:  General exam: Obese elderly lady, appears calm and comfortable  Respiratory system: Clear to auscultation. Respiratory effort normal. Cardiovascular system: S1 & S2 heard, RRR. No JVD, murmurs, rubs, gallops or clicks. Gastrointestinal system: Soft, nontender, nondistended, bowel sounds positive. Central nervous system: Alert and oriented. No focal neurological deficits.Symmetric 5 x 5 power. Extremities: No edema, no cyanosis, pulses intact  and symmetrical. Psychiatry: Judgement and insight appear normal.    DVT prophylaxis: Lovenox Code Status: DNR Family Communication: Discussed with nephew [name] who is also her POA.  Difficult disposition, as patient is living in a hotel and she cannot return there, her home vent on foreclosure and she left from her facility herself??  Disposition Plan:    Status is: Inpatient  Remains inpatient appropriate because:Unsafe d/c plan   Dispo: The patient is from: [facility]              Anticipated d/c is to: ALF              Anticipated d/c date is: 2 days              Patient currently is medically stable to d/c.  Patient is medically stable but difficult disposition.  Social workers are working on it.  Psych care tree consult was placed to check for competency as patient is telling different stories.  Nephew does not want to take the responsibility.  Consultants:   Palliative care  Psychiatry  Procedures:  Antimicrobials:   Data Reviewed: I have personally reviewed following labs and imaging studies  CBC: Recent Labs  Lab 07/09/19 1526 07/10/19 0623 07/11/19 0856  WBC 8.1  --  10.2  NEUTROABS 4.1  --   --   HGB 8.9* 9.3* 9.3*  HCT 31.9* 33.0* 32.7*  MCV 70.6*  --  69.7*  PLT 341  --  [id number]   Basic Metabolic Panel: Recent Labs  Lab 07/09/19 1526 07/10/19 0623 07/11/19 0856  NA 140 140 137  K 4.7 5.1 5.1  CL 106 105 103  CO2 29 26 25   GLUCOSE 126* 284* 241*  BUN 31* 34* 55*  CREATININE 1.10* 1.03* 1.33*  CALCIUM 8.1* 8.5* 8.8*   GFR: Estimated Creatinine Clearance: 43.5 mL/min (A) (by C-G formula based on SCr of 1.33 mg/dL (H)). Liver Function Tests: Recent Labs  Lab 07/09/19 1526  AST 15  ALT 14  ALKPHOS 96  BILITOT 0.3  PROT 6.3*  ALBUMIN 3.1*   No results for input(s): LIPASE, AMYLASE in the last 168 hours. No results for input(s): AMMONIA in the last 168 hours. Coagulation Profile: No results for input(s): INR, PROTIME in the last 168 hours. Cardiac Enzymes: No results for input(s): CKTOTAL, CKMB, CKMBINDEX, TROPONINI in the last 168 hours. BNP (last 3 results) No results for input(s): PROBNP in the last 8760 hours. HbA1C: Recent Labs    07/09/19 2215  HGBA1C 7.3*   CBG: Recent Labs  Lab 07/11/19 1127 07/11/19 1622 07/11/19 2116 07/12/19 0833 07/12/19 1134  GLUCAP 129* 162* 295* 129*  134*   Lipid Profile: No results for input(s): CHOL, HDL, LDLCALC, TRIG, CHOLHDL, LDLDIRECT in the last 72 hours. Thyroid Function Tests: No results for input(s): TSH, T4TOTAL, FREET4, T3FREE, THYROIDAB in the last 72 hours. Anemia Panel: Recent Labs    07/09/19 2215  VITAMINB12 179*  FOLATE 7.9  FERRITIN 3*  TIBC 526*  IRON 17*  RETICCTPCT 1.4   Sepsis Labs: No results for input(s): PROCALCITON, LATICACIDVEN in the last 168 hours.  Recent Results (from the past 240 hour(s))  SARS CORONAVIRUS 2 (TAT 6-24 HRS) Nasopharyngeal Nasopharyngeal Swab     Status: None   Collection Time: 07/10/19  7:45 AM   Specimen: Nasopharyngeal Swab  Result Value Ref Range Status   SARS Coronavirus 2 NEGATIVE NEGATIVE Final    Comment: (NOTE) SARS-CoV-2 target nucleic acids are NOT DETECTED. The SARS-CoV-2 RNA  is generally detectable in upper and lower respiratory specimens during the acute phase of infection. Negative results do not preclude SARS-CoV-2 infection, do not rule out co-infections with other pathogens, and should not be used as the sole basis for treatment or other patient management decisions. Negative results must be combined with clinical observations, patient history, and epidemiological information. The expected result is Negative. Fact Sheet for Patients: SugarRoll.be Fact Sheet for Healthcare Providers: https://www.woods-mathews.com/ This test is not yet approved or cleared by the [place] FDA and  has been authorized for detection and/or diagnosis of SARS-CoV-2 by FDA under an Emergency Use Authorization (EUA). This EUA will remain  in effect (meaning this test can be used) for the duration of the COVID-19 declaration under Section 56 4(b)(1) of the Act, 21 U.S.C. section 360bbb-3(b)(1), unless the authorization is terminated or revoked sooner. Performed at [facility], [place] [address] [place], [place] [address]        Radiology Studies: ECHOCARDIOGRAM COMPLETE  Result Date: 07/11/2019    ECHOCARDIOGRAM REPORT   Patient Name:   TIMMIE RAZZANO Date of Exam: 07/11/2019 Medical Rec #:  [id number]        Height:       66.0 in Accession #:    [id number]       Weight:       242.1 lb Date of Birth:  [birth year]        BSA:          2.169 m Patient Age:    89 years         BP:           134/51 mmHg Patient Gender: F                HR:           48 bpm. Exam Location:  [facility] Procedure: 2D Echo, Cardiac Doppler and Color Doppler Indications:     CHF-acute diastolic [id number]  History:         Patient has no prior history of Echocardiogram examinations.                  COPD; Risk Factors:Hypertension. Diastolic heart failure.  Sonographer:     Sherrie Sport RDCS (AE) Referring Phys:  [id number] NORINS Diagnosing Phys: Nelva Bush MD  Sonographer Comments: Suboptimal apical window. Apical view off-axis. IMPRESSIONS  1. Left ventricular ejection fraction, by estimation, is 55 to 60%. The left ventricle has normal function. The left ventricle has no regional wall motion abnormalities. The left ventricular internal cavity size was moderately dilated. There is moderate  left ventricular hypertrophy. Left ventricular diastolic parameters are consistent with Grade II diastolic dysfunction (pseudonormalization). Elevated left atrial pressure.  2. Right ventricular systolic function is mildly reduced. The right ventricular size is moderately enlarged. There is moderately elevated pulmonary artery systolic pressure. The estimated right ventricular systolic pressure is [id number] mmHg.  3. Left atrial size was moderately dilated.  4. Right atrial size was mild to moderately dilated.  5. The mitral valve is degenerative. Mild mitral valve regurgitation.  6. The aortic valve is tricuspid. Aortic valve regurgitation is not visualized. Mild aortic valve sclerosis is present, with no evidence of aortic valve stenosis.  7. The inferior vena cava is dilated  in size with <50% respiratory variability, suggesting right atrial pressure of 15 mmHg. FINDINGS  Left Ventricle: Left ventricular ejection fraction, by estimation, is 55 to 60%. The left ventricle has normal function. The left  ventricle has no regional wall motion abnormalities. The left ventricular internal cavity size was moderately dilated. There is moderate left ventricular hypertrophy. Left ventricular diastolic parameters are consistent with Grade II diastolic dysfunction (pseudonormalization). Elevated left atrial pressure. Right Ventricle: The right ventricular size is moderately enlarged. No increase in right ventricular wall thickness. Right ventricular systolic function is mildly reduced. There is moderately elevated pulmonary artery systolic pressure. The tricuspid regurgitant velocity is 2.89 m/s, and with an assumed right atrial pressure of 15 mmHg, the estimated right ventricular systolic pressure is [id number] mmHg. Left Atrium: Left atrial size was moderately dilated. Right Atrium: Right atrial size was mild to moderately dilated. Pericardium: There is no evidence of pericardial effusion. Mitral Valve: The mitral valve is degenerative in appearance. Mild mitral annular calcification. Mild mitral valve regurgitation. Tricuspid Valve: The tricuspid valve is normal in structure. Tricuspid valve regurgitation is mild. Aortic Valve: The aortic valve is tricuspid. . There is mild thickening and mild calcification of the aortic valve. Aortic valve regurgitation is not visualized. Mild aortic valve sclerosis is present, with no evidence of aortic valve stenosis. There is mild thickening of the aortic valve. There is mild calcification of the aortic valve. Aortic valve mean gradient measures 2.5 mmHg. Aortic valve peak gradient measures 4.6 mmHg. Aortic valve area, by VTI measures 1.82 cm. Pulmonic Valve: The pulmonic valve was grossly normal. Pulmonic valve regurgitation is not visualized. No evidence of  pulmonic stenosis. Aorta: The aortic root is normal in size and structure. Pulmonary Artery: The pulmonary artery is not well seen. Venous: The inferior vena cava is dilated in size with less than 50% respiratory variability, suggesting right atrial pressure of 15 mmHg. IAS/Shunts: The interatrial septum was not well visualized.  LEFT VENTRICLE PLAX 2D LVIDd:         5.83 cm  Diastology LVIDs:         3.61 cm  LV e' lateral:   10.40 cm/s LV PW:         1.42 cm  LV E/e' lateral: 8.1 LV IVS:        1.24 cm  LV e' medial:    4.13 cm/s LVOT diam:     2.10 cm  LV E/e' medial:  20.3 LV SV:         49 LV SV Index:   23 LVOT Area:     3.46 cm  RIGHT VENTRICLE RV Basal diam:  5.49 cm RV S prime:     11.30 cm/s TAPSE (M-mode): 1.9 cm LEFT ATRIUM              Index       RIGHT ATRIUM           Index LA diam:        5.00 cm  2.31 cm/m  RA Area:     24.80 cm LA Vol (A2C):   103.0 ml 47.49 ml/m RA Volume:   84.60 ml  39.01 ml/m LA Vol (A4C):   95.7 ml  44.12 ml/m LA Biplane Vol: 98.9 ml  45.60 ml/m  AORTIC VALVE                   PULMONIC VALVE AV Area (Vmax):    2.01 cm    PV Vmax:        0.90 m/s AV Area (Vmean):   1.72 cm    PV Peak grad:   3.3 mmHg AV Area (VTI):     1.82 cm  RVOT Peak grad: 4 mmHg AV Vmax:           107.15 cm/s AV Vmean:          73.550 cm/s AV VTI:            0.269 m AV Peak Grad:      4.6 mmHg AV Mean Grad:      2.5 mmHg LVOT Vmax:         62.10 cm/s LVOT Vmean:        36.600 cm/s LVOT VTI:          0.141 m LVOT/AV VTI ratio: 0.52  AORTA Ao Root diam: 3.00 cm MITRAL VALVE               TRICUSPID VALVE MV Area (PHT): 2.36 cm    TR Peak grad:   33.4 mmHg MV Decel Time: 322 msec    TR Vmax:        289.00 cm/s MV E velocity: 83.90 cm/s MV A velocity: 72.30 cm/s  SHUNTS MV E/A ratio:  1.16        Systemic VTI:  0.14 m                            Systemic Diam: 2.10 cm Nelva Bush MD Electronically signed by Nelva Bush MD Signature Date/Time: 07/11/2019/4:16:55 PM    Final     Scheduled  Meds: . allopurinol  100 mg Oral Daily  . colchicine  0.3 mg Oral BID  . enoxaparin (LOVENOX) injection  40 mg Subcutaneous Q24H  . insulin aspart  0-20 Units Subcutaneous TID WC  . insulin glargine  20 Units Subcutaneous QHS  . liver oil-zinc oxide   Topical Once  . metoprolol tartrate  50 mg Oral BID  . predniSONE  40 mg Oral Q breakfast  . sodium chloride flush  3 mL Intravenous Q12H   Continuous Infusions: . sodium chloride       LOS: 3 days   Time spent: 45 Minutes.  Lorella Nimrod, MD [facility]  If 7PM-7AM, please contact night-coverage [email or web address]  07/12/2019, 3:16 PM   This record has been created using Systems analyst. Errors have been sought and corrected,but may not always be located. Such creation errors do not reflect on the standard of care.

## 2019-07-12 NOTE — Care Management Important Message (Signed)
Important Message  Patient Details  Name: Karla Sanchez MRN: [id number] Date of Birth: [birth year]   Medicare Important Message Given:  Yes     Dannette Barbara 07/12/2019, 10:42 AM

## 2019-07-12 NOTE — Progress Notes (Signed)
Patient's bed alarm began alarming because she repositioned. She began yelling and cursing at staff to turn it off. Explained to patient that in order to reset it we need her to move. Patient continue to yell and curse at staff. Patient was told that her behavior is innappropiate.

## 2019-07-12 NOTE — Consult Note (Signed)
[facility] Face-to-Face Psychiatry Consult   Reason for Consult: competency Referring Physician: Floor MD Patient Identification: Karla Sanchez MRN:  [id number] Principal Diagnosis: COPD exacerbation ([place]) Diagnosis:  Principal Problem:   COPD exacerbation ([place]) Active Problems:   Acute on chronic diastolic congestive heart failure (HCC)   DM (diabetes mellitus) ([place])   Peripheral edema   Gout   Obesity, diabetes, and hypertension syndrome ([place])   Anemia   Total Time spent with patient: 20 minutes  Subjective:   Karla Sanchez is a 79 y.o. female patient admitted with multiple medical problems  HPI:  Karla Sanchez a 79 y.o.femalewith medical history significant ofdiastolic HF, COPD (does not wear supplemental oxygen, notcurrentlyon any inhalersas she has run out), HTN, insulin dependent DM, s/p R and L colectomy in January for colon cancerwith end-to-end anastomosis.currently residing in a hotel, admitted foracute on chronicresp failure, most likely COPD/CHF exacerbation.  Weaned off from oxygen pretty quickly and saturating well on room air.  Patient is homeless, staying in a hotel where she cannot go back according to her, kids does not want to get involved, one nephew by marriage has POA but cannot take her responsibility.  According to him she has a foreclosure of her house and left her facility herself and living in a hotel for the past 2 months. Difficult disposition.  Past Psychiatric History: Risk to Self:   Risk to Others:   Prior Inpatient Therapy:   Prior Outpatient Therapy:    Past Medical History:  Past Medical History:  Diagnosis Date  . Colon cancer [facility])    s/p right and left colectomy 2020  . COPD (chronic obstructive pulmonary disease) ([place])   . Diabetes mellitus without complication ([place])   . Diastolic heart failure ([place])   . Gout   . Hypertension     Past Surgical History:  Procedure Laterality Date  . COLON SURGERY     Family  History: No family history on file. Family Psychiatric  History:  Social History:  Social History   Substance and Sexual Activity  Alcohol Use Not Currently     Social History   Substance and Sexual Activity  Drug Use Never    Social History   Socioeconomic History  . Marital status: Widowed    Spouse name: Not on file  . Number of children: Not on file  . Years of education: Not on file  . Highest education level: Not on file  Occupational History  . Not on file  Tobacco Use  . Smoking status: Never Smoker  . Smokeless tobacco: Never Used  Substance and Sexual Activity  . Alcohol use: Not Currently  . Drug use: Never  . Sexual activity: Not Currently  Other Topics Concern  . Not on file  Social History Narrative  . Not on file   Social Determinants of Health   Financial Resource Strain:   . Difficulty of Paying Living Expenses:   Food Insecurity:   . Worried About Charity fundraiser in the Last Year:   . Arboriculturist in the Last Year:   Transportation Needs:   . Film/video editor (Medical):   Marland Kitchen Lack of Transportation (Non-Medical):   Physical Activity:   . Days of Exercise per Week:   . Minutes of Exercise per Session:   Stress:   . Feeling of Stress :   Social Connections:   . Frequency of Communication with Friends and Family:   . Frequency of Social Gatherings with Friends and Family:   .  Attends Religious Services:   . Active Member of Clubs or Organizations:   . Attends Archivist Meetings:   Marland Kitchen Marital Status:    Additional Social History:    Allergies:   Allergies  Allergen Reactions  . Codeine Nausea Only  . Tetracyclines & Related Other (See Comments)    Labs:  Results for orders placed or performed during the hospital encounter of 07/09/19 (from the past 48 hour(s))  Glucose, capillary     Status: Abnormal   Collection Time: 07/10/19  8:58 PM  Result Value Ref Range   Glucose-Capillary 215 (H) 70 - 99 mg/dL     Comment: Glucose reference range applies only to samples taken after fasting for at least 8 hours.  Glucose, capillary     Status: Abnormal   Collection Time: 07/11/19  7:58 AM  Result Value Ref Range   Glucose-Capillary 218 (H) 70 - 99 mg/dL    Comment: Glucose reference range applies only to samples taken after fasting for at least 8 hours.  CBC     Status: Abnormal   Collection Time: 07/11/19  8:56 AM  Result Value Ref Range   WBC 10.2 4.0 - 10.5 K/uL   RBC 4.69 3.87 - 5.11 MIL/uL   Hemoglobin 9.3 (L) 12.0 - 15.0 g/dL   HCT 32.7 (L) 36.0 - 46.0 %   MCV 69.7 (L) 80.0 - 100.0 fL   MCH 19.8 (L) 26.0 - 34.0 pg   MCHC 28.4 (L) 30.0 - 36.0 g/dL   RDW 17.7 (H) 11.5 - 15.5 %   Platelets 377 150 - 400 K/uL   nRBC 0.0 0.0 - 0.2 %    Comment: Performed at [facility], [address], [place], [place] [id number]  Basic metabolic panel     Status: Abnormal   Collection Time: 07/11/19  8:56 AM  Result Value Ref Range   Sodium 137 135 - 145 mmol/L   Potassium 5.1 3.5 - 5.1 mmol/L   Chloride 103 98 - 111 mmol/L   CO2 25 22 - 32 mmol/L   Glucose, Bld 241 (H) 70 - 99 mg/dL    Comment: Glucose reference range applies only to samples taken after fasting for at least 8 hours.   BUN 55 (H) 8 - 23 mg/dL   Creatinine, Ser 1.33 (H) 0.44 - 1.00 mg/dL   Calcium 8.8 (L) 8.9 - 10.3 mg/dL   GFR calc non Af Amer 38 (L) >60 mL/min   GFR calc Af Amer 44 (L) >60 mL/min   Anion gap 9 5 - 15    Comment: Performed at [facility], [address] [place], [place] [address]  Glucose, capillary     Status: Abnormal   Collection Time: 07/11/19 11:27 AM  Result Value Ref Range   Glucose-Capillary 129 (H) 70 - 99 mg/dL    Comment: Glucose reference range applies only to samples taken after fasting for at least 8 hours.  Glucose, capillary     Status: Abnormal   Collection Time: 07/11/19  4:22 PM  Result Value Ref Range   Glucose-Capillary 162 (H) 70 - 99 mg/dL    Comment: Glucose reference  range applies only to samples taken after fasting for at least 8 hours.  Glucose, capillary     Status: Abnormal   Collection Time: 07/11/19  9:16 PM  Result Value Ref Range   Glucose-Capillary 295 (H) 70 - 99 mg/dL    Comment: Glucose reference range applies only to samples taken  after fasting for at least 8 hours.  Glucose, capillary     Status: Abnormal   Collection Time: 07/12/19  8:33 AM  Result Value Ref Range   Glucose-Capillary 129 (H) 70 - 99 mg/dL    Comment: Glucose reference range applies only to samples taken after fasting for at least 8 hours.  Glucose, capillary     Status: Abnormal   Collection Time: 07/12/19 11:34 AM  Result Value Ref Range   Glucose-Capillary 134 (H) 70 - 99 mg/dL    Comment: Glucose reference range applies only to samples taken after fasting for at least 8 hours.  Glucose, capillary     Status: Abnormal   Collection Time: 07/12/19  4:58 PM  Result Value Ref Range   Glucose-Capillary 175 (H) 70 - 99 mg/dL    Comment: Glucose reference range applies only to samples taken after fasting for at least 8 hours.    Current Facility-Administered Medications  Medication Dose Route Frequency Provider Last Rate Last Admin  . 0.9 %  sodium chloride infusion  250 mL Intravenous PRN Norins, Heinz Knuckles, MD      . allopurinol (ZYLOPRIM) tablet 100 mg  100 mg Oral Daily Max Sane, MD   100 mg at 07/12/19 0914  . ALPRAZolam Duanne Moron) tablet 0.25 mg  0.25 mg Oral TID PRN Neena Rhymes, MD   0.25 mg at 07/11/19 2133  . colchicine tablet 0.3 mg  0.3 mg Oral BID Hart Robinsons A, RPH   0.3 mg at 07/12/19 0914  . enoxaparin (LOVENOX) injection 40 mg  40 mg Subcutaneous Q24H Norins, Heinz Knuckles, MD   40 mg at 07/12/19 0913  . insulin aspart (novoLOG) injection 0-20 Units  0-20 Units Subcutaneous TID WC Norins, Heinz Knuckles, MD   3 Units at 07/12/19 1156  . insulin glargine (LANTUS) injection 20 Units  20 Units Subcutaneous QHS Neena Rhymes, MD   20 Units at 07/11/19 2133  .  ipratropium-albuterol (DUONEB) 0.5-2.5 (3) MG/3ML nebulizer solution 3 mL  3 mL Nebulization Q6H PRN Max Sane, MD      . liver oil-zinc oxide (DESITIN) 40 % ointment   Topical Once Drenda Freeze, MD      . metoprolol tartrate (LOPRESSOR) tablet 50 mg  50 mg Oral BID Neena Rhymes, MD   50 mg at 07/12/19 0915  . predniSONE (DELTASONE) tablet 40 mg  40 mg Oral Q breakfast Norins, Heinz Knuckles, MD   40 mg at 07/12/19 0915  . sodium chloride flush (NS) 0.9 % injection 3 mL  3 mL Intravenous Q12H Norins, Heinz Knuckles, MD   3 mL at 07/12/19 0916  . sodium chloride flush (NS) 0.9 % injection 3 mL  3 mL Intravenous PRN Norins, Heinz Knuckles, MD         Psychiatric Specialty Exam: Physical Exam  Review of Systems  Blood pressure (!) 153/77, pulse (!) 58, temperature 98.3 F (36.8 C), temperature source Oral, resp. rate 18, height 5\' 6"  (1.676 m), weight 111.7 kg, SpO2 95 %.Body mass index is 39.75 kg/m.  General Appearance: Fairly Groomed and Well Groomed  Eye Contact:  Good  Speech:  Clear and Coherent  Volume:  Normal  Mood:  Euthymic  Affect:  Congruent  Thought Process:  Coherent  Orientation:  Full (Time, Place, and Person)  Thought Content:  Logical  Suicidal Thoughts:  No  Homicidal Thoughts:  No  Memory:  Immediate;   Good  Judgement:  Good  Insight:  Good  Psychomotor Activity:  Normal  Concentration:  Concentration: Good  Recall:  Good  Fund of Knowledge:  Good  Language:  Good  Akathisia:  No  Handed:  Ambidextrous  AIMS (if indicated):     Assets:  Communication Skills Desire for Improvement  ADL's:  Intact  Cognition:  WNL  Sleep:      I spoke with the nurse taking care of the patient.  I also had a pleasant conversation with the patient to assess her understanding of the current situation.  She explains that she recently sold her house in the [place] area and came to this area where she wanted to settle.  For this reason she was in the hotel as a brief solution.  As  noted elsewhere returning to the hotel is not an option at this point.  She feels that she can take care of herself and that she can find a longer term solution to retain her autonomy.  In my conversation with her she is well aware of her capabilities and limitations and the challenge of finding a solution post discharge.  I see no evidence of mental confusion or inability to participate in the decision.  Overall her interactions are pleasant and thoughtful and her memory of past events and places and decisions she has made are accurate and consistent.  She does not exhibit any anxiety or depression at this point and is positive and looking forward to returning to healthy state where she can be discharged from the hospital.  Treatment Plan Summary: No need for psychiatric intervention  Disposition: Patient does not meet criteria for psychiatric inpatient admission.  Alesia Morin, MD 07/12/2019 5:05 PM

## 2019-07-13 ENCOUNTER — Encounter: Payer: Self-pay | Admitting: Internal Medicine

## 2019-07-13 LAB — BASIC METABOLIC PANEL
Anion gap: 7 (ref 5–15)
BUN: 69 mg/dL — ABNORMAL HIGH (ref 8–23)
CO2: 27 mmol/L (ref 22–32)
Calcium: 8.1 mg/dL — ABNORMAL LOW (ref 8.9–10.3)
Chloride: 107 mmol/L (ref 98–111)
Creatinine, Ser: 1.11 mg/dL — ABNORMAL HIGH (ref 0.44–1.00)
GFR calc Af Amer: 55 mL/min — ABNORMAL LOW (ref >60)
GFR calc non Af Amer: 47 mL/min — ABNORMAL LOW (ref >60)
Glucose, Bld: 172 mg/dL — ABNORMAL HIGH (ref 70–99)
Potassium: 4.8 mmol/L (ref 3.5–5.1)
Sodium: 141 mmol/L (ref 135–145)

## 2019-07-13 LAB — CBC
HCT: 32.6 % — ABNORMAL LOW (ref 36.0–46.0)
Hemoglobin: 9.4 g/dL — ABNORMAL LOW (ref 12.0–15.0)
MCH: 19.8 pg — ABNORMAL LOW (ref 26.0–34.0)
MCHC: 28.8 g/dL — ABNORMAL LOW (ref 30.0–36.0)
MCV: 68.6 fL — ABNORMAL LOW (ref 80.0–100.0)
Platelets: 338 10*3/uL (ref 150–400)
RBC: 4.75 MIL/uL (ref 3.87–5.11)
RDW: 17.6 % — ABNORMAL HIGH (ref 11.5–15.5)
WBC: 9.5 10*3/uL (ref 4.0–10.5)
nRBC: 0 % (ref 0.0–0.2)

## 2019-07-13 LAB — GLUCOSE, CAPILLARY
Glucose-Capillary: 144 mg/dL — ABNORMAL HIGH (ref 70–99)
Glucose-Capillary: 307 mg/dL — ABNORMAL HIGH (ref 70–99)

## 2019-07-13 MED ORDER — LANTUS 100 UNIT/ML ~~LOC~~ SOLN: 15.0000 [IU] | Freq: Every day | SUBCUTANEOUS | 11 refills | Status: DC

## 2019-07-13 MED ORDER — ALBUTEROL SULFATE HFA 108 (90 BASE) MCG/ACT IN AERS: 2.0000 | INHALATION_SPRAY | Freq: Four times a day (QID) | RESPIRATORY_TRACT | 1 refills | Status: DC | PRN

## 2019-07-13 MED ORDER — ZINC OXIDE 40 % EX OINT: TOPICAL_OINTMENT | Freq: Once | CUTANEOUS | 0 refills | Status: AC

## 2019-07-13 MED ORDER — ALLOPURINOL 100 MG PO TABS: 100.0000 mg | ORAL_TABLET | Freq: Every day | ORAL | 1 refills | Status: DC

## 2019-07-13 MED ORDER — INSULIN GLARGINE 100 UNIT/ML ~~LOC~~ SOLN: 15.0000 [IU] | Freq: Every day | SUBCUTANEOUS | 3 refills | Status: DC

## 2019-07-13 MED ORDER — METOPROLOL TARTRATE 50 MG PO TABS: 50.0000 mg | ORAL_TABLET | Freq: Two times a day (BID) | ORAL | 1 refills | Status: DC

## 2019-07-13 MED ORDER — FUROSEMIDE 40 MG PO TABS: 40.0000 mg | ORAL_TABLET | Freq: Every day | ORAL | 1 refills | Status: DC

## 2019-07-13 MED ORDER — "LUER LOCK SAFETY SYRINGES 25G X 5/8"" 3 ML MISC": 15.0000 [IU] | Freq: Every day | 1 refills | Status: DC

## 2019-07-13 NOTE — Progress Notes (Signed)
Pt refused to take her scheduled lantus and for staff to change wet linens.  Talked with patient about the her BG level of 236 and the need for the medication.  Patient became very aggressively verbal with angry arm movements. Stating she was not taking any meds or having her soaked linens changed she just wanted to sleep.

## 2019-07-13 NOTE — Progress Notes (Signed)
Pt. Has soaked bed linens and gown in urine. Pt. Refused to be cleaned up on night shift and still refuses a bath and linen change. Educated pt. On importance of hygiene. Pt. Aggressively refuses stating, " you can get on out of this room and bath yourself! Listen to what I am saying! I do not want to be cleaned up. I will shower at the hotel."

## 2019-07-13 NOTE — TOC Transition Note (Signed)
Transition of Care [facility]) - CM/SW Discharge Note   Patient Details  Name: Karla Sanchez MRN: [id number] Date of Birth: 1940-05-01  [facility] CM/SW Contact:  Shelbie Hutching, RN Phone Number: 07/13/2019, 12:42 PM   Clinical Narrative:    Patient is medically stable to be discharged home with home health services.  Patient will be going to the [facility] on [address] (this is where patient has been living).  EMS will transport patient.  Patient's nephew will take her wheelchair and clothes to her at the hotel when he gets off of work this evening.  This RNCM has scheduled patient for primary care appointment on May 13th at [facility] with Dr. Tressia Miners at 11 am.   [place] referral accepted by Floydene Flock with [facility] for RN, PT, OT, aide and social work.  Dr. Rockne Menghini will write for home health orders until the patient goes for her PCP appointment.     Final next level of care: [place] Barriers to Discharge: Barriers Resolved   Patient Goals and CMS Choice Patient states their goals for this hospitalization and ongoing recovery are:: Ready to get out of the hospital CMS Medicare.gov Compare Post Acute Care list provided to:: Patient Choice offered to / list presented to : Patient  Discharge Placement                Patient to be transferred to facility by: [facility]- to transport home Name of family member notified: Lind Covert Patient and family notified of of transfer: 07/13/19  Discharge Plan and Services   Discharge Planning Services: CM Consult, Follow-up appt scheduled Post Acute Care Choice: Home Health                    HH Arranged: RN, PT, OT, Nurse's Aide, Social Work [employer] Agency: [place] ([place]) Date [place]: 07/13/19 Time Edith [place]: 6 Representative spoke with at [place]: [place] (SDOH) Interventions      Readmission Risk Interventions No flowsheet data found.

## 2019-07-13 NOTE — Progress Notes (Signed)
Attempted several times during the shift to have patient allow [place] to clean her and change her sheets.  The sheets are wet with urine covering most of bed.  She countinues to be aggressive in not allowing [place] to help clean her and her bed . She also  Refused labs this am.  Threw clean linen on floor and stated she only wanted a blanket. Blanket given to patient. Charge nurse notified of both incident. Patient is alert and oriented.

## 2019-07-13 NOTE — Discharge Summary (Addendum)
Physician Discharge Summary  Karla Sanchez [id number] DOB: 1940-10-24 DOA: 07/09/2019  PCP: Gladstone Lighter, MD  Admit date: 07/09/2019 Discharge date: 07/13/2019  Admitted From: [facility] Disposition:  Hotel  Recommendations for Outpatient Follow-up:  1. Follow up with PCP in 1-2 weeks 2. Follow-up in heart failure clinic. 3. Please obtain BMP/CBC in one week 4. Please follow up on the following pending results: None  Home Health: Yes Equipment/Devices: Wheelchair Discharge Condition: Fair CODE STATUS: DNR Diet recommendation: Heart Healthy / Carb Modified    Brief/Interim Summary: Karla Sanchez a 79 y.o.femalewith medical history significant ofdiastolic HF, COPD (does not wear supplemental oxygen, notcurrentlyon any inhalersas she has run out), HTN, insulin dependent DM, s/p R and L colectomy in January for colon cancerwith end-to-end anastomosis.currently residing in a hotel, admitted foracute on chronicresp failure, most likely COPD/CHF exacerbation.  Weaned off from oxygen pretty quickly and saturating well on room air. Patient is homeless, staying in a hotel, stating that she sold her house and wants to move to [place].  Her kids does not want to get involved.  1 nephew by marriage has POA who checks on her periodically but cannot take more responsibility.  Patient is wheelchair-bound for the past 15 years and would like to go to a long-term facility/assisted living facility.  We tried getting psych consult for competency to see if we can help her but she was competent enough to make her own decisions.  We arrange home health services for her and she was agreeable to go back to her hotel and will look for facilities herself.  She came with some concern of acute on chronic CHF.  Initially diuresed with IV Lasix and discharged on a p.o. dose of 40 mg daily.  Symptoms resolved and she was saturating well on room air.  She will follow-up with heart failure  clinic. Echocardiogram was done during current hospitalization with sure EF of 55 to 60%, no wall motion abnormalities and grade 2 diastolic dysfunction.  Her blood pressure remained within goal and she will continue home dose of metoprolol.  She has an A1c of 7.3 and will continue with home dose of Lantus.  She will continue home dose of allopurinol and colchicine for gout.  Patient has an history of chronic gout and wheelchair-bound for the past 15 years with some bone destruction.  Patient has an anemia of chronic disease.  Hemoglobin remained stable around 9.  She also has an history of colectomy due to colon cancer.  No obvious bleeding.  She will follow-up with her primary care physician for further management.  Discharge Diagnoses:  Principal Problem:   COPD exacerbation ([place]) Active Problems:   Acute on chronic diastolic congestive heart failure (HCC)   DM (diabetes mellitus) ([place])   Peripheral edema   Gout   Obesity, diabetes, and hypertension syndrome (HCC)   Anemia  Discharge Instructions  Discharge Instructions    Diet - low sodium heart healthy   Complete by: As directed    Discharge instructions   Complete by: As directed    It was pleasure taking care of you. Your blood sugar was high because you are getting some steroids while in the hospital, please check your blood glucose level regularly and follow-up with your primary care physician for further recommendations. I decrease the dose of Lasix to 40 mg daily, please follow-up with your primary care physician if you start developing more swelling and they can manage appropriately.   Increase activity slowly   Complete by:  As directed      Allergies as of 07/13/2019      Reactions   Codeine Nausea Only   Tetracyclines & Related Other (See Comments)      Medication List    TAKE these medications   albuterol 108 (90 Base) MCG/ACT inhaler Commonly known as: VENTOLIN HFA Inhale 2 puffs into the lungs every 6  (six) hours as needed for wheezing or shortness of breath.   allopurinol 100 MG tablet Commonly known as: ZYLOPRIM Take 1 tablet (100 mg total) by mouth daily. Start taking on: July 14, 2019   colchicine 0.6 MG tablet Take 0.6 mg by mouth daily.   furosemide 40 MG tablet Commonly known as: Lasix Take 1 tablet (40 mg total) by mouth daily. What changed: when to take this   Lantus 100 UNIT/ML injection Generic drug: insulin glargine Inject 0.15 mLs (15 Units total) into the skin at bedtime. What changed: how much to take   liver oil-zinc oxide 40 % ointment Commonly known as: DESITIN Apply topically once for 1 dose.   metoprolol tartrate 50 MG tablet Commonly known as: LOPRESSOR Take 1 tablet (50 mg total) by mouth 2 (two) times daily.      Follow-up Information    [facility]. Go on 07/18/2019.   Specialty: Cardiology Why: at 3:30pm. Enter through the Branch entrance Contact information: [address] [place] [phone]       Gladstone Lighter, MD. Go on 08/03/2019.   Specialty: Internal Medicine Why: Appointment scheduled for 5/13 at 11 am- arrive at least 15 minutes before scheduled time.  Please call [facility] office if you need to reschedule.  Contact information: [place] [address] [phone]          Allergies  Allergen Reactions  . Codeine Nausea Only  . Tetracyclines & Related Other (See Comments)    Consultations:  Psychiatry  Palliative care  Procedures/Studies: DG Chest 2 View  Result Date: 06/17/2019 CLINICAL DATA:  Shortness of breath. EXAM: CHEST - 2 VIEW COMPARISON:  None. FINDINGS: The heart size is enlarged. There are bronchitic changes at the lung bases bilaterally. The pulmonary vasculature appears prominent which can be seen in patients with pulmonary artery hypertension. Atherosclerotic changes are noted of  the thoracic aorta. There is mild elevation of the left hemidiaphragm. There are old healed left-sided rib fractures. There are advanced degenerative changes of both glenohumeral joints. IMPRESSION: 1. No acute cardiopulmonary process. 2. Cardiomegaly. 3. Dilated pulmonary arteries which can be seen in patients with elevated pulmonary artery pressures. Electronically Signed   By: Constance Holster M.D.   On: 06/17/2019 16:55   US Venous Img Lower Unilateral Right (DVT)  Result Date: 07/09/2019 CLINICAL DATA:  Right leg swelling. EXAM: RIGHT LOWER EXTREMITY VENOUS DOPPLER ULTRASOUND TECHNIQUE: Gray-scale sonography with compression, as well as color and duplex ultrasound, were performed to evaluate the deep venous system(s) from the level of the common femoral vein through the popliteal and proximal calf veins. COMPARISON:  None. FINDINGS: VENOUS Normal compressibility of the common femoral, superficial femoral, and popliteal veins, as well as the visualized calf veins. Visualized portions of profunda femoral vein and great saphenous vein unremarkable. No filling defects to suggest DVT on grayscale or color Doppler imaging. Doppler waveforms show normal direction of venous flow, normal respiratory phasicity and response to augmentation. Limited views of the contralateral common femoral vein are unremarkable. OTHER None. Limitations:  none IMPRESSION: No femoropopliteal DVT nor evidence of DVT within the visualized calf veins. If clinical symptoms are inconsistent or if there are persistent or worsening symptoms, further imaging (possibly involving the iliac veins) may be warranted. Electronically Signed   By: Claudie Revering M.D.   On: 07/09/2019 16:15   DG Chest Port 1 View  Result Date: 07/09/2019 CLINICAL DATA:  4 57-year-old female with shortness of breath. EXAM: PORTABLE CHEST 1 VIEW COMPARISON:  Chest radiograph dated 06/17/2019. FINDINGS: Background of mild emphysema and chronic interstitial coarsening  and bronchitic changes. There has been interval progression of interstitial prominence with reticulonodular appearance which may represent mild edema or superimposed pneumonia. Clinical correlation is recommended. There is no focal consolidation, pleural effusion, pneumothorax. Stable mild cardiomegaly. Coronary vascular calcification and atherosclerotic calcification of the aorta. There is prominence of the central pulmonary arteries suggestive of a degree of pulmonary hypertension. Clinical correlation is recommended. No acute osseous pathology. IMPRESSION: 1. Findings may represent interval development of mild edema or atypical pneumonia. Clinical correlation is recommended. 2. Stable mild cardiomegaly with findings suggestive of a degree of pulmonary hypertension. Electronically Signed   By: Anner Crete M.D.   On: 07/09/2019 15:47   ECHOCARDIOGRAM COMPLETE  Result Date: 07/11/2019    ECHOCARDIOGRAM REPORT   Patient Name:   Karla Sanchez Date of Exam: 07/11/2019 Medical Rec #:  [id number]        Height:       66.0 in Accession #:    [id number]       Weight:       242.1 lb Date of Birth:  1940-08-20        BSA:          2.169 m Patient Age:    31 years         BP:           134/51 mmHg Patient Gender: F                HR:           48 bpm. Exam Location:  [facility] Procedure: 2D Echo, Cardiac Doppler and Color Doppler Indications:     CHF-acute diastolic [id number]  History:         Patient has no prior history of Echocardiogram examinations.                  COPD; Risk Factors:Hypertension. Diastolic heart failure.  Sonographer:     Sherrie Sport RDCS (AE) Referring Phys:  [id number] NORINS Diagnosing Phys: Nelva Bush MD  Sonographer Comments: Suboptimal apical window. Apical view off-axis. IMPRESSIONS  1. Left ventricular ejection fraction, by estimation, is 55 to 60%. The left ventricle has normal function. The left ventricle has no regional wall motion abnormalities. The left ventricular internal cavity  size was moderately dilated. There is moderate  left ventricular hypertrophy. Left ventricular diastolic parameters are consistent with Grade II diastolic dysfunction (pseudonormalization). Elevated left atrial pressure.  2. Right ventricular systolic function is mildly reduced. The right ventricular size is moderately enlarged. There is moderately elevated pulmonary artery systolic pressure. The estimated right ventricular systolic pressure is [id number] mmHg.  3. Left atrial size was moderately dilated.  4. Right atrial size was mild to moderately dilated.  5. The mitral valve is degenerative. Mild mitral valve regurgitation.  6. The aortic valve is tricuspid. Aortic valve regurgitation is not visualized. Mild aortic valve sclerosis is present, with no evidence of aortic valve stenosis.  7. The  inferior vena cava is dilated in size with <50% respiratory variability, suggesting right atrial pressure of 15 mmHg. FINDINGS  Left Ventricle: Left ventricular ejection fraction, by estimation, is 55 to 60%. The left ventricle has normal function. The left ventricle has no regional wall motion abnormalities. The left ventricular internal cavity size was moderately dilated. There is moderate left ventricular hypertrophy. Left ventricular diastolic parameters are consistent with Grade II diastolic dysfunction (pseudonormalization). Elevated left atrial pressure. Right Ventricle: The right ventricular size is moderately enlarged. No increase in right ventricular wall thickness. Right ventricular systolic function is mildly reduced. There is moderately elevated pulmonary artery systolic pressure. The tricuspid regurgitant velocity is 2.89 m/s, and with an assumed right atrial pressure of 15 mmHg, the estimated right ventricular systolic pressure is [id number] mmHg. Left Atrium: Left atrial size was moderately dilated. Right Atrium: Right atrial size was mild to moderately dilated. Pericardium: There is no evidence of pericardial effusion.  Mitral Valve: The mitral valve is degenerative in appearance. Mild mitral annular calcification. Mild mitral valve regurgitation. Tricuspid Valve: The tricuspid valve is normal in structure. Tricuspid valve regurgitation is mild. Aortic Valve: The aortic valve is tricuspid. . There is mild thickening and mild calcification of the aortic valve. Aortic valve regurgitation is not visualized. Mild aortic valve sclerosis is present, with no evidence of aortic valve stenosis. There is mild thickening of the aortic valve. There is mild calcification of the aortic valve. Aortic valve mean gradient measures 2.5 mmHg. Aortic valve peak gradient measures 4.6 mmHg. Aortic valve area, by VTI measures 1.82 cm. Pulmonic Valve: The pulmonic valve was grossly normal. Pulmonic valve regurgitation is not visualized. No evidence of pulmonic stenosis. Aorta: The aortic root is normal in size and structure. Pulmonary Artery: The pulmonary artery is not well seen. Venous: The inferior vena cava is dilated in size with less than 50% respiratory variability, suggesting right atrial pressure of 15 mmHg. IAS/Shunts: The interatrial septum was not well visualized.  LEFT VENTRICLE PLAX 2D LVIDd:         5.83 cm  Diastology LVIDs:         3.61 cm  LV e' lateral:   10.40 cm/s LV PW:         1.42 cm  LV E/e' lateral: 8.1 LV IVS:        1.24 cm  LV e' medial:    4.13 cm/s LVOT diam:     2.10 cm  LV E/e' medial:  20.3 LV SV:         49 LV SV Index:   23 LVOT Area:     3.46 cm  RIGHT VENTRICLE RV Basal diam:  5.49 cm RV S prime:     11.30 cm/s TAPSE (M-mode): 1.9 cm LEFT ATRIUM              Index       RIGHT ATRIUM           Index LA diam:        5.00 cm  2.31 cm/m  RA Area:     24.80 cm LA Vol (A2C):   103.0 ml 47.49 ml/m RA Volume:   84.60 ml  39.01 ml/m LA Vol (A4C):   95.7 ml  44.12 ml/m LA Biplane Vol: 98.9 ml  45.60 ml/m  AORTIC VALVE                   PULMONIC VALVE AV Area (Vmax):    2.01 cm  PV Vmax:        0.90 m/s AV Area  (Vmean):   1.72 cm    PV Peak grad:   3.3 mmHg AV Area (VTI):     1.82 cm    RVOT Peak grad: 4 mmHg AV Vmax:           107.15 cm/s AV Vmean:          73.550 cm/s AV VTI:            0.269 m AV Peak Grad:      4.6 mmHg AV Mean Grad:      2.5 mmHg LVOT Vmax:         62.10 cm/s LVOT Vmean:        36.600 cm/s LVOT VTI:          0.141 m LVOT/AV VTI ratio: 0.52  AORTA Ao Root diam: 3.00 cm MITRAL VALVE               TRICUSPID VALVE MV Area (PHT): 2.36 cm    TR Peak grad:   33.4 mmHg MV Decel Time: 322 msec    TR Vmax:        289.00 cm/s MV E velocity: 83.90 cm/s MV A velocity: 72.30 cm/s  SHUNTS MV E/A ratio:  1.16        Systemic VTI:  0.14 m                            Systemic Diam: 2.10 cm Nelva Bush MD Electronically signed by Nelva Bush MD Signature Date/Time: 07/11/2019/4:16:55 PM    Final     Subjective: Patient was feeling better when seen today.  No new complaints.  She was agitated overnight and was very aggressive with nursing staff.  Discharge Exam: Vitals:   07/12/19 1602 07/12/19 2153  BP: (!) 153/77 (!) 152/46  Pulse: (!) 58 67  Resp: 18   Temp:    SpO2: 95%    Vitals:   07/12/19 1134 07/12/19 1138 07/12/19 1602 07/12/19 2153  BP: (!) 116/27  (!) 153/77 (!) 152/46  Pulse: (!) 46  (!) 58 67  Resp: 19 18 18    Temp: (!) 97.5 F (36.4 C) 98.3 F (36.8 C)    TempSrc: Oral Oral    SpO2: 95%  95%   Weight:      Height:        General: Pt is alert, awake, not in acute distress Cardiovascular: RRR, S1/S2 +, no rubs, no gallops Respiratory: CTA bilaterally, no wheezing, no rhonchi Abdominal: Soft, NT, ND, bowel sounds + Extremities: no edema, no cyanosis   The results of significant diagnostics from this hospitalization (including imaging, microbiology, ancillary and laboratory) are listed below for reference.    Microbiology: Recent Results (from the past 240 hour(s))  SARS CORONAVIRUS 2 (TAT 6-24 HRS) Nasopharyngeal Nasopharyngeal Swab     Status: None    Collection Time: 07/10/19  7:45 AM   Specimen: Nasopharyngeal Swab  Result Value Ref Range Status   SARS Coronavirus 2 NEGATIVE NEGATIVE Final    Comment: (NOTE) SARS-CoV-2 target nucleic acids are NOT DETECTED. The SARS-CoV-2 RNA is generally detectable in upper and lower respiratory specimens during the acute phase of infection. Negative results do not preclude SARS-CoV-2 infection, do not rule out co-infections with other pathogens, and should not be used as the sole basis for treatment or other patient management decisions. Negative results must be combined with  clinical observations, patient history, and epidemiological information. The expected result is Negative. Fact Sheet for Patients: SugarRoll.be Fact Sheet for Healthcare Providers: https://www.woods-mathews.com/ This test is not yet approved or cleared by the [place] FDA and  has been authorized for detection and/or diagnosis of SARS-CoV-2 by FDA under an Emergency Use Authorization (EUA). This EUA will remain  in effect (meaning this test can be used) for the duration of the COVID-19 declaration under Section 56 4(b)(1) of the Act, 21 U.S.C. section 360bbb-3(b)(1), unless the authorization is terminated or revoked sooner. Performed at [facility], [place] [address], [place], [place] [address]      Labs: BNP (last 3 results) Recent Labs    06/17/19 1620 07/09/19 1527  BNP 186.0* 0000000*   Basic Metabolic Panel: Recent Labs  Lab 07/09/19 1526 07/10/19 0623 07/11/19 0856 07/13/19 1046  NA 140 140 137 141  K 4.7 5.1 5.1 4.8  CL 106 105 103 107  CO2 29 26 25 27   GLUCOSE 126* 284* 241* 172*  BUN 31* 34* 55* 69*  CREATININE 1.10* 1.03* 1.33* 1.11*  CALCIUM 8.1* 8.5* 8.8* 8.1*   Liver Function Tests: Recent Labs  Lab 07/09/19 1526  AST 15  ALT 14  ALKPHOS 96  BILITOT 0.3  PROT 6.3*  ALBUMIN 3.1*   No results for input(s): LIPASE, AMYLASE in the last  168 hours. No results for input(s): AMMONIA in the last 168 hours. CBC: Recent Labs  Lab 07/09/19 1526 07/10/19 0623 07/11/19 0856 07/13/19 1046  WBC 8.1  --  10.2 9.5  NEUTROABS 4.1  --   --   --   HGB 8.9* 9.3* 9.3* 9.4*  HCT 31.9* 33.0* 32.7* 32.6*  MCV 70.6*  --  69.7* 68.6*  PLT 341  --  377 338   Cardiac Enzymes: No results for input(s): CKTOTAL, CKMB, CKMBINDEX, TROPONINI in the last 168 hours. BNP: Invalid input(s): POCBNP CBG: Recent Labs  Lab 07/12/19 1134 07/12/19 1658 07/12/19 2110 07/13/19 1154 07/13/19 1629  GLUCAP 134* 175* 236* 144* 307*   D-Dimer No results for input(s): DDIMER in the last 72 hours. Hgb A1c No results for input(s): HGBA1C in the last 72 hours. Lipid Profile No results for input(s): CHOL, HDL, LDLCALC, TRIG, CHOLHDL, LDLDIRECT in the last 72 hours. Thyroid function studies No results for input(s): TSH, T4TOTAL, T3FREE, THYROIDAB in the last 72 hours.  Invalid input(s): FREET3 Anemia work up No results for input(s): VITAMINB12, FOLATE, FERRITIN, TIBC, IRON, RETICCTPCT in the last 72 hours. Urinalysis    Component Value Date/Time   COLORURINE YELLOW (A) 02/16/2018 2234   APPEARANCEUR CLEAR (A) 02/16/2018 2234   LABSPEC 1.010 02/16/2018 2234   PHURINE 6.0 02/16/2018 2234   GLUCOSEU NEGATIVE 02/16/2018 2234   HGBUR NEGATIVE 02/16/2018 2234   BILIRUBINUR NEGATIVE 02/16/2018 2234   KETONESUR NEGATIVE 02/16/2018 2234   PROTEINUR NEGATIVE 02/16/2018 2234   NITRITE POSITIVE (A) 02/16/2018 2234   LEUKOCYTESUR NEGATIVE 02/16/2018 2234   Sepsis Labs Invalid input(s): PROCALCITONIN,  WBC,  LACTICIDVEN Microbiology Recent Results (from the past 240 hour(s))  SARS CORONAVIRUS 2 (TAT 6-24 HRS) Nasopharyngeal Nasopharyngeal Swab     Status: None   Collection Time: 07/10/19  7:45 AM   Specimen: Nasopharyngeal Swab  Result Value Ref Range Status   SARS Coronavirus 2 NEGATIVE NEGATIVE Final    Comment: (NOTE) SARS-CoV-2 target nucleic  acids are NOT DETECTED. The SARS-CoV-2 RNA is generally detectable in upper and lower respiratory specimens during the acute phase of  infection. Negative results do not preclude SARS-CoV-2 infection, do not rule out co-infections with other pathogens, and should not be used as the sole basis for treatment or other patient management decisions. Negative results must be combined with clinical observations, patient history, and epidemiological information. The expected result is Negative. Fact Sheet for Patients: SugarRoll.be Fact Sheet for Healthcare Providers: https://www.woods-mathews.com/ This test is not yet approved or cleared by the [place] FDA and  has been authorized for detection and/or diagnosis of SARS-CoV-2 by FDA under an Emergency Use Authorization (EUA). This EUA will remain  in effect (meaning this test can be used) for the duration of the COVID-19 declaration under Section 56 4(b)(1) of the Act, 21 U.S.C. section 360bbb-3(b)(1), unless the authorization is terminated or revoked sooner. Performed at [facility], [place] [address] [place], [place] [address]     Time coordinating discharge: Over 30 minutes  SIGNED:  Lorella Nimrod, MD  [facility] 07/13/2019, 5:13 PM  If 7PM-7AM, please contact night-coverage [email or web address]  This record has been created using Systems analyst. Errors have been sought and corrected,but may not always be located. Such creation errors do not reflect on the standard of care.

## 2019-07-17 NOTE — Progress Notes (Deleted)
   Patient ID: Karla Sanchez, female    DOB: 1940-12-28, 79 y.o.   MRN: [id number]  HPI  Karla Sanchez is a 79 y/o female with a history of  Echo report from 07/11/19 reviewed and showed an EF of 55-60%  Review of Systems    Physical Exam

## 2019-07-18 ENCOUNTER — Ambulatory Visit: Payer: Federal, State, Local not specified - PPO | Admitting: Family

## 2019-07-19 ENCOUNTER — Telehealth: Payer: Self-pay | Admitting: Family

## 2019-07-19 NOTE — Telephone Encounter (Signed)
Patient cancelled appointment With CHF Clinic and has no plans to reschedule. She did not give a reason why.

## 2019-07-30 ENCOUNTER — Emergency Department
Admission: EM | Admit: 2019-07-30 | Discharge: 2019-08-03 | Disposition: A | Discharge status: Home / Self Care | Payer: Medicare Other | Attending: Emergency Medicine | Admitting: Emergency Medicine

## 2019-07-30 ENCOUNTER — Other Ambulatory Visit: Payer: Self-pay

## 2019-07-30 ENCOUNTER — Emergency Department: Payer: Medicare Other

## 2019-07-30 DIAGNOSIS — E119 Type 2 diabetes mellitus without complications: Secondary | ICD-10-CM | POA: Diagnosis not present

## 2019-07-30 DIAGNOSIS — Z5900 Homelessness unspecified: Secondary | ICD-10-CM

## 2019-07-30 DIAGNOSIS — R82998 Other abnormal findings in urine: Secondary | ICD-10-CM | POA: Diagnosis not present

## 2019-07-30 DIAGNOSIS — Z79899 Other long term (current) drug therapy: Secondary | ICD-10-CM | POA: Diagnosis not present

## 2019-07-30 DIAGNOSIS — Z794 Long term (current) use of insulin: Secondary | ICD-10-CM | POA: Diagnosis not present

## 2019-07-30 DIAGNOSIS — Z20822 Contact with and (suspected) exposure to covid-19: Secondary | ICD-10-CM | POA: Insufficient documentation

## 2019-07-30 DIAGNOSIS — R0602 Shortness of breath: Secondary | ICD-10-CM | POA: Diagnosis present

## 2019-07-30 DIAGNOSIS — I509 Heart failure, unspecified: Secondary | ICD-10-CM | POA: Insufficient documentation

## 2019-07-30 DIAGNOSIS — M6281 Muscle weakness (generalized): Secondary | ICD-10-CM | POA: Insufficient documentation

## 2019-07-30 DIAGNOSIS — I5032 Chronic diastolic (congestive) heart failure: Secondary | ICD-10-CM | POA: Diagnosis not present

## 2019-07-30 DIAGNOSIS — I110 Hypertensive heart disease with heart failure: Secondary | ICD-10-CM | POA: Insufficient documentation

## 2019-07-30 DIAGNOSIS — Z7189 Other specified counseling: Secondary | ICD-10-CM | POA: Diagnosis not present

## 2019-07-30 DIAGNOSIS — Z66 Do not resuscitate: Secondary | ICD-10-CM | POA: Diagnosis not present

## 2019-07-30 LAB — URINALYSIS, COMPLETE (UACMP) WITH MICROSCOPIC
Bacteria, UA: NONE SEEN
Bilirubin Urine: NEGATIVE
Glucose, UA: NEGATIVE mg/dL
Hgb urine dipstick: NEGATIVE
Ketones, ur: NEGATIVE mg/dL
Leukocytes,Ua: NEGATIVE
Nitrite: NEGATIVE
Protein, ur: 100 mg/dL — AB
Specific Gravity, Urine: 1.009 (ref 1.005–1.030)
pH: 5 (ref 5.0–8.0)

## 2019-07-30 LAB — CBC
HCT: 33.3 % — ABNORMAL LOW (ref 36.0–46.0)
Hemoglobin: 9.3 g/dL — ABNORMAL LOW (ref 12.0–15.0)
MCH: 19.6 pg — ABNORMAL LOW (ref 26.0–34.0)
MCHC: 27.9 g/dL — ABNORMAL LOW (ref 30.0–36.0)
MCV: 70.3 fL — ABNORMAL LOW (ref 80.0–100.0)
Platelets: 327 10*3/uL (ref 150–400)
RBC: 4.74 MIL/uL (ref 3.87–5.11)
RDW: 19 % — ABNORMAL HIGH (ref 11.5–15.5)
WBC: 7.2 10*3/uL (ref 4.0–10.5)
nRBC: 0 % (ref 0.0–0.2)

## 2019-07-30 LAB — BASIC METABOLIC PANEL
Anion gap: 7 (ref 5–15)
BUN: 52 mg/dL — ABNORMAL HIGH (ref 8–23)
CO2: 28 mmol/L (ref 22–32)
Calcium: 8.6 mg/dL — ABNORMAL LOW (ref 8.9–10.3)
Chloride: 106 mmol/L (ref 98–111)
Creatinine, Ser: 1.06 mg/dL — ABNORMAL HIGH (ref 0.44–1.00)
GFR calc Af Amer: 58 mL/min — ABNORMAL LOW (ref >60)
GFR calc non Af Amer: 50 mL/min — ABNORMAL LOW (ref >60)
Glucose, Bld: 132 mg/dL — ABNORMAL HIGH (ref 70–99)
Potassium: 5.4 mmol/L — ABNORMAL HIGH (ref 3.5–5.1)
Sodium: 141 mmol/L (ref 135–145)

## 2019-07-30 LAB — TROPONIN I (HIGH SENSITIVITY): Troponin I (High Sensitivity): 15 ng/L (ref <18)

## 2019-07-30 LAB — GLUCOSE, CAPILLARY: Glucose-Capillary: 124 mg/dL — ABNORMAL HIGH (ref 70–99)

## 2019-07-30 LAB — BRAIN NATRIURETIC PEPTIDE: B Natriuretic Peptide: 161 pg/mL — ABNORMAL HIGH (ref 0.0–100.0)

## 2019-07-30 MED ORDER — ALLOPURINOL 100 MG PO TABS
100.0000 mg | ORAL_TABLET | Freq: Every day | ORAL | Status: DC
  Administered 2019-07-31 – 2019-08-03 (×3): 100 mg via ORAL
  Filled 2019-07-30 (×5): qty 1

## 2019-07-30 MED ORDER — IPRATROPIUM-ALBUTEROL 0.5-2.5 (3) MG/3ML IN SOLN
3.0000 mL | Freq: Once | RESPIRATORY_TRACT | Status: AC
  Administered 2019-07-30: 17:00:00 3 mL via RESPIRATORY_TRACT
  Filled 2019-07-30: qty 3

## 2019-07-30 MED ORDER — FUROSEMIDE 40 MG PO TABS
40.0000 mg | ORAL_TABLET | Freq: Once | ORAL | Status: AC
  Administered 2019-07-30: 40 mg via ORAL
  Filled 2019-07-30: qty 1

## 2019-07-30 MED ORDER — INSULIN GLARGINE 100 UNIT/ML ~~LOC~~ SOLN
15.0000 [IU] | Freq: Every day | SUBCUTANEOUS | Status: DC
  Administered 2019-07-30 – 2019-08-02 (×3): 15 [IU] via SUBCUTANEOUS
  Filled 2019-07-30 (×5): qty 0.15

## 2019-07-30 MED ORDER — ALBUTEROL SULFATE (2.5 MG/3ML) 0.083% IN NEBU: 5.0000 mg | INHALATION_SOLUTION | Freq: Once | RESPIRATORY_TRACT | Status: DC

## 2019-07-30 MED ORDER — METOPROLOL TARTRATE 50 MG PO TABS
50.0000 mg | ORAL_TABLET | Freq: Two times a day (BID) | ORAL | Status: DC
  Administered 2019-08-01 – 2019-08-03 (×4): 50 mg via ORAL
  Filled 2019-07-30 (×6): qty 1

## 2019-07-30 MED ORDER — LOPERAMIDE HCL 2 MG PO CAPS
4.0000 mg | ORAL_CAPSULE | Freq: Once | ORAL | Status: AC
  Administered 2019-07-30: 4 mg via ORAL
  Filled 2019-07-30: qty 2

## 2019-07-30 MED ORDER — ALBUTEROL SULFATE HFA 108 (90 BASE) MCG/ACT IN AERS
2.0000 | INHALATION_SPRAY | Freq: Four times a day (QID) | RESPIRATORY_TRACT | Status: DC | PRN
  Filled 2019-07-30: qty 6.7

## 2019-07-30 MED ORDER — FUROSEMIDE 40 MG PO TABS
40.0000 mg | ORAL_TABLET | Freq: Every day | ORAL | Status: DC
  Administered 2019-07-30 – 2019-08-03 (×5): 40 mg via ORAL
  Filled 2019-07-30 (×4): qty 1

## 2019-07-30 NOTE — ED Notes (Signed)
Pt states coming in for SOB for the last 3 days and that her right foot has been swelling for those last 3 days. Pt denies pain. Pt states shew as cold. Temperature in room increased and pt provided with 2 warm blankets. PT states it is too warm and the temperature decreased in the room at pt request.  Pt refused covid-19 swab at this time.

## 2019-07-30 NOTE — Progress Notes (Signed)
A consult was placed to IV Therapy for an iv start; staff has attempted ; see progress notes;  Pt very explicit about where she will allow the iv to be placed;  Attempted x 1 to place the iv, however, the catheter would not thread due to scar tissue;  Pt did not like where the tourniquet was placed, or how it was placed.  No other attempts made;  RN aware.

## 2019-07-30 NOTE — ED Notes (Signed)
Pt calling out for monitor beeping. Advised pt that monitor will beep from time to time. Verbalized understanding. BP cuff placed back on pt.

## 2019-07-30 NOTE — ED Notes (Signed)
Pt is laying in bed. Pt had a liquid bowel movement. Pt cleaned with 2 RNs. Pt now laying in bed with lights dimmed for comfort, side rails up for safety.

## 2019-07-30 NOTE — ED Notes (Signed)
Lab contacted to get blood work

## 2019-07-30 NOTE — ED Notes (Addendum)
This RN attempted to start an IV on the patient. Upon attempting to insert the IV the patient began yelling and stating that this RN could not use the vein chosen. This RN informed the pt that it was the best option assessed. Upon attempting to insert the IV the pt began smacking the RN's hands and pulled arm back ending the IV attempt.

## 2019-07-30 NOTE — ED Triage Notes (Signed)
Pt arrives [facility] for SOB. Arrives on oxygen, chronic [place] wearer. Hx COPD, DM. Pt talking in complete sentences. Lives at [facility]. CBG 268. VSS.

## 2019-07-30 NOTE — ED Notes (Signed)
Provider notified that pt states she thinks she has a UTI due to burning with urination. Provider placed order for urine culture

## 2019-07-30 NOTE — ED Provider Notes (Signed)
[facility] Emergency Department Provider Note   ____________________________________________   First MD Initiated Contact with Patient 07/30/19 1503     (approximate)  I have reviewed the triage vital signs and the nursing notes.   HISTORY  Chief Complaint Shortness of Breath    HPI Karla Sanchez is a 79 y.o. female CHF COPD diabetes  Increasing shortness of breath for 2 weeks since leaving hospital.  Cannot lay flat.  Has sleep on multiple pillows.  Engaging with social services, also social worker has been out to visit her.  Currently residing at [facility].  Significant difficulty with home care.  Compliant with medications.  Increased shortness of breath slowly progressing over 2 weeks.  Still able to transfer to wheelchair goes back and forth for breakfast, often ordering food in.  She is able to go from her room to the breakfast area, had breakfast there today.  Frequently orders and food from [place] next-door as well.  Feels swollen.  Shortness of breath feels similar when she was hospitalized a few weeks ago.  Denies wheezing.  No fevers or chills no chest pain.   Past Medical History:  Diagnosis Date  . Colon cancer [facility])    s/p right and left colectomy 2020  . COPD (chronic obstructive pulmonary disease) ([place])   . Diabetes mellitus without complication ([place])   . Diastolic heart failure ([place])   . Gout   . Hypertension     Patient Active Problem List   Diagnosis Date Noted  . COPD exacerbation ([place]) 07/09/2019  . Acute on chronic diastolic congestive heart failure ([place]) 07/09/2019  . DM (diabetes mellitus) ([place]) 07/09/2019  . Peripheral edema 07/09/2019  . Gout 07/09/2019  . Obesity, diabetes, and hypertension syndrome ([place]) 07/09/2019  . Anemia 07/09/2019    Past Surgical History:  Procedure Laterality Date  . COLON SURGERY      Prior to Admission medications   Medication Sig Start Date End Date Taking? Authorizing  Provider  albuterol (VENTOLIN HFA) 108 (90 Base) MCG/ACT inhaler Inhale 2 puffs into the lungs every 6 (six) hours as needed for wheezing or shortness of breath. 07/13/19  Yes Lorella Nimrod, MD  allopurinol (ZYLOPRIM) 100 MG tablet Take 1 tablet (100 mg total) by mouth daily. 07/14/19  Yes Lorella Nimrod, MD  colchicine 0.6 MG tablet Take 0.6 mg by mouth daily. 04/13/19  Yes [provider]  furosemide (LASIX) 40 MG tablet Take 1 tablet (40 mg total) by mouth daily. 07/13/19 08/12/19 Yes Lorella Nimrod, MD  insulin glargine (LANTUS) 100 UNIT/ML injection Inject 0.15 mLs (15 Units total) into the skin at bedtime. 07/13/19  Yes Lorella Nimrod, MD  metoprolol tartrate (LOPRESSOR) 50 MG tablet Take 1 tablet (50 mg total) by mouth 2 (two) times daily. 07/13/19  Yes Lorella Nimrod, MD  SYRINGE-NEEDLE, DISP, 3 ML (LUER LOCK SAFETY SYRINGES) 25G X 5/8" 3 ML MISC 15 Units by Does not apply route at bedtime. 07/13/19  Yes Lorella Nimrod, MD    Allergies Other, Exenatide, Levofloxacin, Losartan, Codeine, and Tetracyclines & related  History reviewed. No pertinent family history.  Social History Social History   Tobacco Use  . Smoking status: Never Smoker  . Smokeless tobacco: Never Used  Substance Use Topics  . Alcohol use: Not Currently  . Drug use: Never    Review of Systems Constitutional: No fever/chills Eyes: No visual changes. ENT: No sore throat. Cardiovascular: Denies chest pain. Respiratory: See HPI Gastrointestinal: No abdominal pain.   Genitourinary:  Negative for dysuria. Musculoskeletal: Negative for back pain.  Swelling both feet.  Immobile uses wheelchair at baseline Skin: Negative for rash. Neurological: Negative for headaches, areas of focal weakness or numbness.    ____________________________________________   PHYSICAL EXAM:  VITAL SIGNS: ED Triage Vitals  Enc Vitals Group     BP 07/30/19 1433 (!) 191/76     Pulse Rate 07/30/19 1433 96     Resp 07/30/19 1433 18      Temp 07/30/19 1433 98 F (36.7 C)     Temp Source 07/30/19 1433 Oral     SpO2 07/30/19 1433 97 %     Weight 07/30/19 1438 246 lb (111.6 kg)     Height 07/30/19 1438 5\' 6"  (1.676 m)     Head Circumference --      Peak Flow --      Pain Score 07/30/19 1438 10     Pain Loc --      Pain Edu? --      Excl. in [place]? --     Constitutional: Alert and oriented.  Semi-Fowler's.  Appears mildly dyspneic.  No acute distress.  Home oxygen, currently at baseline 2 L. Eyes: Conjunctivae are normal. Head: Atraumatic. Nose: No congestion/rhinnorhea. Mouth/Throat: Mucous membranes are moist. Neck: No stridor.  Cardiovascular: Normal rate, regular rhythm. Grossly normal heart sounds.  Good peripheral circulation. Respiratory: Lungs sound bases bilateral.  No wheezing.  Speaks in phrases.  Mild use accessory muscles Gastrointestinal: Soft and nontender. No distention. Musculoskeletal: No lower extremity tenderness lateral lower extremity edema. Neurologic:  Normal speech and language. No gross focal neurologic deficits are appreciated.  Skin:  Skin is warm, dry and intact. No rash noted. Psychiatric: Mood and affect are normal. Speech and behavior are normal.  ____________________________________________   LABS (all labs ordered are listed, but only abnormal results are displayed)  Labs Reviewed  CBC - Abnormal; Notable for the following components:      Result Value   Hemoglobin 9.3 (*)    HCT 33.3 (*)    MCV 70.3 (*)    MCH 19.6 (*)    MCHC 27.9 (*)    RDW 19.0 (*)    All other components within normal limits  BRAIN NATRIURETIC PEPTIDE - Abnormal; Notable for the following components:   B Natriuretic Peptide 161.0 (*)    All other components within normal limits  BASIC METABOLIC PANEL - Abnormal; Notable for the following components:   Potassium 5.4 (*)    Glucose, Bld 132 (*)    BUN 52 (*)    Creatinine, Ser 1.06 (*)    Calcium 8.6 (*)    GFR calc non Af Amer 50 (*)    GFR calc Af  Amer 58 (*)    All other components within normal limits  URINALYSIS, COMPLETE (UACMP) WITH MICROSCOPIC - Abnormal; Notable for the following components:   Color, Urine STRAW (*)    APPearance CLEAR (*)    Protein, ur 100 (*)    All other components within normal limits  GLUCOSE, CAPILLARY - Abnormal; Notable for the following components:   Glucose-Capillary 124 (*)    All other components within normal limits  C DIFFICILE QUICK SCREEN W PCR REFLEX  RESPIRATORY PANEL BY RT PCR (FLU A&B, COVID)  URINE CULTURE  GASTROINTESTINAL PANEL BY PCR, STOOL (REPLACES STOOL CULTURE)  CBG MONITORING, ED  CBG MONITORING, ED  CBG MONITORING, ED  CBG MONITORING, ED  CBG MONITORING, ED  TROPONIN I (HIGH SENSITIVITY)  TROPONIN  I (HIGH SENSITIVITY)   ____________________________________________  EKG  Reviewed interpreted at 1440 Heart rate 90 QRS 160 QTc 500 Normal sinus rhythm, right bundle branch block probable left ventricular hypertrophy.  Compared with previous EKG from April 18 appears similar in appearance ____________________________________________  RADIOLOGY  DG Chest 2 View  Result Date: 07/30/2019 CLINICAL DATA:  Shortness of breath. EXAM: CHEST - 2 VIEW COMPARISON:  July 09, 2019 FINDINGS: Mild, diffuse chronic appearing increased lung markings are seen which is unchanged in appearance when compared to the prior study. There is no evidence of acute infiltrate, pleural effusion or pneumothorax. The cardiac silhouette is moderately enlarged and unchanged in size. Marked severity calcification of the aortic arch is seen. A chronic eighth left rib fracture is seen. Multilevel degenerative changes are noted throughout the thoracic spine. IMPRESSION: Chronic appearing increased lung markings without evidence of acute or active cardiopulmonary disease. Electronically Signed   By: Virgina Norfolk M.D.   On: 07/30/2019 15:51    Imaging reviewed chronic chronic increased lung markings  without evidence of acute ____________________________________________   PROCEDURES  Procedure(s) performed: None  Procedures  Critical Care performed: No  ____________________________________________   INITIAL IMPRESSION / ASSESSMENT AND PLAN / ED COURSE  Pertinent labs & imaging results that were available during my care of the patient were reviewed by me and considered in my medical decision making (see chart for details).   Dyspnea.  Progressive in nature.  Reports similar same symptoms as previous worse.  Currently residing at [facility].  Reports she needs additional assistance difficulty getting around with daily cares at the [facility].  Appears volume overloaded.  Clinical Course as of Jul 30 37  Sun Jul 30, 2019  1703 Labs delayed, patient became upset when ER nursing attempted to place IV demanding that IV needs to be placed on the front side of her forearm instead of the back side. She seems somewhat unreasonable in this request, but is absolutely insistent that she does not want ER nursing to attempt an IV again and that she would however be able to allow the IV team to attempt. She is alert and oriented, speaking to someone on the phone on my entry. She is in no acute distress, and I spoke to her and have developed a reasonable plan to have her IV team evaluate. This will cause some delay, but is consistent with the patient's goals of care   [MQ]  1744 IV team at bedside   [MQ]  1831 IV access attempts by myself with ultrasound guidance unsuccessful, in some part the patient refused to allow me to attempt IVs in certain areas including her antecubital regions where it does appear she has veins that would be quite amenable to axis.  However she adamantly refuses these, reports she is refused IVs there since the 1960s.  Attempted to provide shared medical decision making regarding risks and benefits of obtaining labs with her, and at this point she is refusing IV sticks  including external jugular, antecubital, and will only allow attempts at extremely small veins in her hands which I am not successful at on my 1 attempt on her right hand.  No complications.  Sterile technique utilized.  Patient is willing to allow [place] to attempt to draw labs but no longer wishes to allow IV access attempts at this time.  She is being somewhat challenging to deal with almost negotiating on her care, but I do wish to at least be able to check labs.  She does appear to be understanding, and well oriented, and seems to maintain capacity to make decisions   [MQ]  2212 Nursing reports patient having diarrhea.  Given her recent hospitalizations, will send sample for evaluation for infection though she does have a history of colonic surgery in the past as well.  She denies any acute abdominal symptoms.   [MQ]    Clinical Course User Index [MQ] Delman Kitten, MD   ----------------------------------------- 6:11 PM on 07/30/2019 -----------------------------------------  On patient sitting up, resting.  IV team unable to establish an IV or obtain labs.  Patient's sitting up the bedside, reports that she does not really feel like she needs an IV now, may we could just draw labs.  Will attempt for lab draw.  She does not appear in distress.  Able to sit herself up to the side of the bed without discomfort.  She does not appear acutely dyspneic, speaking in full and clear sentences with normal oxygen saturation at this time.  I do believe that she is becoming dismissed with reclining and recumbent position, but as she sits up breathing appears quite normal at this time and no hypoxia  Patient does not wish for IV placement at this time.  I have discussed with her, she is adamant she would does not wish to have an IV placed anywhere other than hands or forearms which we have not been able to find access and she refuses to have IVs placed in her antecubitals, neck, or other areas.  Given the patient's  wishes, and her shared medical decision making we will continue on without IV access at this time unless she were to decompensate or worsen  ----------------------------------------- 12:38 AM on 07/31/2019 -----------------------------------------  Discussed case previous with Dr. Nehemiah Massed.  Placed consult for his team tomorrow as well as additional assistance from palliative who have seen her in the past.  Anticipate social work consultation as well.  Patient hemodynamically stable, no distress.  Engaging social work team to assist with additional care needs and placement as we also await feedback from our cardiology service to optimize her management as well.  Ongoing care signed Dr. Joan Mayans  ____________________________________________   FINAL CLINICAL IMPRESSION(S) / ED DIAGNOSES  Final diagnoses:  Congestive heart failure, unspecified HF chronicity, unspecified heart failure type [facility])  Homelessness        Note:  This document was prepared using Dragon voice recognition software and may include unintentional dictation errors       Delman Kitten, MD 07/31/19 0040

## 2019-07-30 NOTE — ED Notes (Signed)
Lights turned out per pt request and legs covered with blanket. Door shut as well per pt request.

## 2019-07-30 NOTE — ED Notes (Signed)
Dr Jacqualine Code made aware of pt not wanting IV- Dr Jacqualine Code to bedside to speak with pt

## 2019-07-30 NOTE — ED Notes (Signed)
Lab at bedside to attempt to get bloodwork

## 2019-07-30 NOTE — ED Notes (Signed)
Lab able to obtain bloodwork

## 2019-07-30 NOTE — ED Notes (Addendum)
Attempted IV x1 unsuccessful- pt requesting a "more experienced nurse" to try for IV

## 2019-07-30 NOTE — ED Notes (Signed)
Dr Jacqualine Code at beside to attempt ultrasound IV

## 2019-07-31 DIAGNOSIS — Z590 Homelessness: Secondary | ICD-10-CM

## 2019-07-31 DIAGNOSIS — Z515 Encounter for palliative care: Secondary | ICD-10-CM

## 2019-07-31 DIAGNOSIS — I509 Heart failure, unspecified: Secondary | ICD-10-CM

## 2019-07-31 DIAGNOSIS — Z7189 Other specified counseling: Secondary | ICD-10-CM

## 2019-07-31 DIAGNOSIS — Z66 Do not resuscitate: Secondary | ICD-10-CM | POA: Diagnosis not present

## 2019-07-31 LAB — GASTROINTESTINAL PANEL BY PCR, STOOL (REPLACES STOOL CULTURE)

## 2019-07-31 LAB — C DIFFICILE QUICK SCREEN W PCR REFLEX
C Diff antigen: NEGATIVE
C Diff interpretation: NOT DETECTED
C Diff toxin: NEGATIVE

## 2019-07-31 LAB — GLUCOSE, CAPILLARY
Glucose-Capillary: 92 mg/dL (ref 70–99)
Glucose-Capillary: 104 mg/dL — ABNORMAL HIGH (ref 70–99)
Glucose-Capillary: 136 mg/dL — ABNORMAL HIGH (ref 70–99)
Glucose-Capillary: 144 mg/dL — ABNORMAL HIGH (ref 70–99)

## 2019-07-31 LAB — TROPONIN I (HIGH SENSITIVITY): Troponin I (High Sensitivity): 15 ng/L (ref <18)

## 2019-07-31 LAB — BASIC METABOLIC PANEL
Anion gap: 6 (ref 5–15)
BUN: 49 mg/dL — ABNORMAL HIGH (ref 8–23)
CO2: 30 mmol/L (ref 22–32)
Calcium: 8.2 mg/dL — ABNORMAL LOW (ref 8.9–10.3)
Chloride: 106 mmol/L (ref 98–111)
Creatinine, Ser: 0.94 mg/dL (ref 0.44–1.00)
GFR calc Af Amer: >60 mL/min (ref >60)
GFR calc non Af Amer: 58 mL/min — ABNORMAL LOW (ref >60)
Glucose, Bld: 105 mg/dL — ABNORMAL HIGH (ref 70–99)
Potassium: 4.9 mmol/L (ref 3.5–5.1)
Sodium: 142 mmol/L (ref 135–145)

## 2019-07-31 LAB — URINE CULTURE: Culture: <10000 — AB

## 2019-07-31 LAB — RESPIRATORY PANEL BY RT PCR (FLU A&B, COVID)
Influenza A by PCR: NEGATIVE
Influenza B by PCR: NEGATIVE
SARS Coronavirus 2 by RT PCR: NEGATIVE

## 2019-07-31 MED ORDER — INSULIN ASPART 100 UNIT/ML ~~LOC~~ SOLN: 0.0000 [IU] | Freq: Every day | SUBCUTANEOUS | Status: DC

## 2019-07-31 MED ORDER — INSULIN ASPART 100 UNIT/ML ~~LOC~~ SOLN
0.0000 [IU] | Freq: Three times a day (TID) | SUBCUTANEOUS | Status: DC
  Administered 2019-07-31: 1 [IU] via SUBCUTANEOUS
  Administered 2019-08-02: 2 [IU] via SUBCUTANEOUS
  Filled 2019-07-31 (×2): qty 1

## 2019-07-31 NOTE — ED Notes (Signed)
Pt sitting in bed. Denies pain or needs at this time. NAD noted

## 2019-07-31 NOTE — ED Notes (Signed)
This RN back to bedside, pt given her purse per her request.

## 2019-07-31 NOTE — Progress Notes (Signed)
Inpatient Diabetes Program Recommendations  AACE/ADA: New Consensus Statement on Inpatient Glycemic Control   Target Ranges:  Prepandial:   less than 140 mg/dL      Peak postprandial:   less than 180 mg/dL (1-2 hours)      Critically ill patients:  140 - 180 mg/dL   Results for MIKHIA, LUGER (MRN [id number]) as of 07/31/2019 09:12  Ref. Range 07/30/2019 22:09 07/31/2019 08:43  Glucose-Capillary Latest Ref Range: 70 - 99 mg/dL 124 (H) 92   Review of Glycemic Control  Diabetes history: DM2 Outpatient Diabetes medications: Lantus 15 units QHS Current orders for Inpatient glycemic control: Lantus 15 units QHS  Inpatient Diabetes Program Recommendations:   Correction (SSI): Please consider using Glycemic Control order set to order CBGs AC&HS with Novolog 0-9 units TID wiht meals and Novolog 0-5 units QHS.  Thanks, Barnie Alderman, RN, MSN, CDE Diabetes Coordinator Inpatient Diabetes Program [phone] (Team Pager from 8am to 5pm)

## 2019-07-31 NOTE — TOC Progression Note (Signed)
Transition of Care [facility]) - Progression Note    Patient Details  Name: Anniyah Svensson MRN: [id number] Date of Birth: 1941-03-10  Transition of Care [facility]) CM/SW Contact  Anselm Pancoast, RN Phone Number: 07/31/2019, 3:50 PM  Clinical Narrative:    LVMM for Lauralyn Primes, SW @ [place] [phone] requesting callback to discuss progress on placement into ALF facility due to patient no longer able to afford insurance.    Expected Discharge Plan: Long Term Nursing Home Barriers to Discharge: ED Patient Insisting on an Alternate Living Situation/Facility  Expected Discharge Plan and Services Expected Discharge Plan: [place]       Living arrangements for the past 2 months: Hotel/Motel                                       Social Determinants of Health (SDOH) Interventions    Readmission Risk Interventions No flowsheet data found.

## 2019-07-31 NOTE — ED Notes (Signed)
Pt appears to be sleeping at this time. Not disturbed to promote rest and recovery. Continuous cardiac, BP, and O2 monitoring in place. No signs of distress noted at this time.

## 2019-07-31 NOTE — ED Notes (Signed)
This RN to bedside with [place], Therapist, sports. Introduced self to patient. This RN and Engineer, technical sales, RN changed all linen and cleaned patient due to episode of urine incontinence. Pt repositioned in bed for comfort and to assist with breathing. Pt became irate with this RN and began yelling at this RN, this RN asked patient to not yell at this RN. Pt states "I wasn't yelling at you I was trying to catch my breath, you were yelling at me, do you yell at all of your patients or argue with all of them?" This RN explained was not yelling at patient, this RN was explaining to patient this RN was unable to position patient in a way that satisfied her. Pt denies further needs at this time, temp adjusted per patient request, call bell remains within reach at this time.

## 2019-07-31 NOTE — ED Notes (Signed)
Pt adjusted in bed for comfort. Purewick cannister emptied. Pt resting quietly.

## 2019-07-31 NOTE — ED Notes (Signed)
CBG obtained by this RN. Pt tolerated well. O2 placed back in patient's nose. Pt given phone to make phone call. Lab called regarding repeat troponin, this RN conferred with EDP Funke regarding re-collect, per EDP Funke, recollect troponin at this time.

## 2019-07-31 NOTE — TOC Progression Note (Signed)
Transition of Care [facility]) - Progression Note    Patient Details  Name: Karla Sanchez MRN: [id number] Date of Birth: [birth year]  Transition of Care [facility]) CM/SW [place], RN Phone Number: 07/31/2019, 12:30 PM  Clinical Narrative:    Confirmed patient active with [place] for nursing, aide, PT and social worker. Social worker Janace Hoard [phone]. RN CM will outreach to Education officer, museum for updated information.    Expected Discharge Plan: Long Term Nursing Home Barriers to Discharge: ED Patient Insisting on an Alternate Living Situation/Facility  Expected Discharge Plan and Services Expected Discharge Plan: [place]       Living arrangements for the past 2 months: Hotel/Motel                                       Social Determinants of Health (SDOH) Interventions    Readmission Risk Interventions No flowsheet data found.

## 2019-07-31 NOTE — ED Notes (Signed)
Lunch tray provided. 

## 2019-07-31 NOTE — ED Notes (Signed)
Care Manager [name] at bedside.

## 2019-07-31 NOTE — TOC Initial Note (Signed)
Transition of Care [facility]) - Initial/Assessment Note    Patient Details  Name: Karla Sanchez MRN: [id number] Date of Birth: Jul 01, 1940  Transition of Care [facility]) CM/SW Contact:    Anselm Pancoast, RN Phone Number: 07/31/2019, 12:14 PM  Clinical Narrative:                 Spoke to patient at bedside who states she lives in the [facility] and is no longer able to afford staying there. Patient is wheelchair dependent at baseline and states she is able to give herself a spongebath and dress herself at home with extended time. Patient states she has a monthly income of $2100 and does not qualify for Medicaid however last week she had a Education officer, museum from [place] come to her home and tell her that "they could work around the income and find her placement". Patient frustrated that she has not had food today and Probation officer updated ED RN that patient needed meal. RN CM will outreach to APS about possible social worker/involvement.   Expected Discharge Plan: Long Term Nursing Home Barriers to Discharge: ED Patient Insisting on an Alternate Living Situation/Facility   Patient Goals and CMS Choice Patient states their goals for this hospitalization and ongoing recovery are:: Seeking placement into LTC facility      Expected Discharge Plan and Services Expected Discharge Plan: [place]       Living arrangements for the past 2 months: Hotel/Motel                                      Prior Living Arrangements/Services Living arrangements for the past 2 months: Hotel/Motel Lives with:: Self Patient language and need for interpreter reviewed:: Yes Do you feel safe going back to the place where you live?: No   Cannot afford hotel any longer and no other options for care  Need for Family Participation in Patient Care: Yes (Comment) Care giver support system in place?: No (comment) Current home services: DME(W/C at baseline, Motorized scooter, Handicap van, grab bars) Criminal  Activity/Legal Involvement Pertinent to Current Situation/Hospitalization: No - Comment as needed  Activities of Daily Living      Permission Sought/Granted Permission sought to share information with : Case Manager, Customer service manager Permission granted to share information with : Yes, Verbal Permission Granted  Share Information with NAME: Lind Covert  Permission granted to share info w AGENCY: [place] granted to share info w Relationship: nephew     Emotional Assessment Appearance:: Appears stated age, Disheveled Attitude/Demeanor/Rapport: Engaged Affect (typically observed): Anxious, Frustrated Orientation: : Oriented to Self, Oriented to Place, Oriented to  Time, Oriented to Situation Alcohol / Substance Use: Never Used Psych Involvement: No (comment)  Admission diagnosis:  sob Patient Active Problem List   Diagnosis Date Noted  . COPD exacerbation ([place]) 07/09/2019  . Acute on chronic diastolic congestive heart failure ([place]) 07/09/2019  . DM (diabetes mellitus) ([place]) 07/09/2019  . Peripheral edema 07/09/2019  . Gout 07/09/2019  . Obesity, diabetes, and hypertension syndrome ([place]) 07/09/2019  . Anemia 07/09/2019   PCP:  Gladstone Lighter, MD Pharmacy:   [facility] [phone] [address], [place] [address] Phone: [phone] Fax: [phone]     Social Determinants of Health (SDOH) Interventions    Readmission  Risk Interventions No flowsheet data found.

## 2019-07-31 NOTE — Evaluation (Signed)
Physical Therapy Evaluation Patient Details Name: Karla Sanchez MRN: [id number] DOB: 1941/02/23 Today's Date: 07/31/2019   History of Present Illness  presented to ER from hotel (current living arrangements) due to progressive SOB, LE edema, attributed to CHF  Clinical Impression  Patient in L sidelying upon arrival to room; agreeable to participation with session.  Alert and oriented to all information; follows commands, though requires frequent redirection to task/conversation at hand.  Generally hyperverbal and self-directive of care.  Bilat UE/LE strength and ROM grossly symmetrical and WFL for basic transfers and gait; LEs not tested with resistance due to reports of destructive bone quality (rendering patient WC level for previous 15 years).  Demonstrates ability to complete bed mobility with mod indep; sit/stand, basic transfers (SPT bilat) without assist device, sup/mod indep.  Does require UE support for external stabilization throughout transfer; however, appears safe, comfortable and confident with transfer ability.  Does agree that performance is baseline for self at this time. Given noted performance and confirmation of baseline level of mobility, no acute PT needs identified at this time.  Will complete initial order.  Please re-consult should needs change prior to discharge.    Follow Up Recommendations No PT follow up    Equipment Recommendations       Recommendations for Other Services       Precautions / Restrictions Precautions Precautions: Fall Restrictions Weight Bearing Restrictions: No      Mobility  Bed Mobility Overal bed mobility: Modified Independent                Transfers Overall transfer level: Modified independent   Transfers: Sit to/from Stand;Stand Pivot Transfers           General transfer comment: requires UE support throughout, but able to complete with steady, safe performance; good comfort/confidence in transfer; reports  baseline performance for herself  Ambulation/Gait             General Gait Details: non-ambulatory at baseline  Stairs            Wheelchair Mobility    Modified Rankin (Stroke Patients Only)       Balance Overall balance assessment: Needs assistance Sitting-balance support: No upper extremity supported;Feet supported Sitting balance-Leahy Scale: Normal     Standing balance support: During functional activity;Single extremity supported Standing balance-Leahy Scale: Good                               Pertinent Vitals/Pain Pain Assessment: Faces Faces Pain Scale: Hurts little more Pain Location: tailbone Pain Descriptors / Indicators: Aching;Grimacing Pain Intervention(s): Limited activity within patient's tolerance;Monitored during session;Repositioned    Home Living                   Additional Comments: Living at hotel but wants to go to retirement facility/ALF    Prior Function     Gait / Transfers Assistance Needed: Uses heels of B feet to propel self in manual w/c (doesn't use hands to propel w/c d/t h/o hand issues); has power w/c but is in the "shop" and prefers to use manual w/c.  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 and can only take 2 steps for transfers; does not walk d/t  this and does not stand for any length of time d/t this--pt reports it will "crush" the bones in her feet)     Hand Dominance   Dominant Hand: Right    Extremity/Trunk Assessment   Upper Extremity Assessment Upper Extremity Assessment: Overall WFL for tasks assessed(grossly at least 4+/5 throughout)    Lower Extremity Assessment Lower Extremity Assessment: Overall WFL for tasks assessed(grossly at least  4-/5 throughout, supports body weight with transfers; not tested with resistance due to reports of poor bone quality)       Communication   Communication: No difficulties  Cognition Arousal/Alertness: Awake/alert Behavior During Therapy: WFL for tasks assessed/performed Overall Cognitive Status: Within Functional Limits for tasks assessed                                        General Comments      Exercises     Assessment/Plan    PT Assessment Patent does not need any further PT services  PT Problem List         PT Treatment Interventions      PT Goals (Current goals can be found in the Care Plan section)  Acute Rehab PT Goals Patient Stated Goal: to get comfortable in this place-concerned with temperature in room PT Goal Formulation: With patient Time For Goal Achievement: 08/14/19 Potential to Achieve Goals: Fair    Frequency     Barriers to discharge        Co-evaluation               AM-PAC PT "6 Clicks" Mobility  Outcome Measure Help needed turning from your back to your side while in a flat bed without using bedrails?: None Help needed moving from lying on your back to sitting on the side of a flat bed without using bedrails?: None Help needed moving to and from a bed to a chair (including a wheelchair)?: None Help needed standing up from a chair using your arms (e.g., wheelchair or bedside chair)?: None Help needed to walk in hospital room?: Total Help needed climbing 3-5 steps with a railing? : Total 6 Click Score: 18    End of Session   Activity Tolerance: Patient tolerated treatment well Patient left: in bed;with call bell/phone within reach Nurse Communication: Mobility status PT Visit Diagnosis: Muscle weakness (generalized) (M62.81)    Time: 0936-1000 PT Time Calculation (min) (ACUTE ONLY): 24 min   Charges:   PT Evaluation $PT Eval Moderate Complexity: 1 Mod          Deanna Boehlke H. Owens Shark, PT, DPT, NCS 07/31/19,  10:30 PM [phone]

## 2019-07-31 NOTE — ED Notes (Signed)
Pt had episode of urine incontinence. Pt and bed cleaned. Sheets replaced. Call bell within reach.

## 2019-07-31 NOTE — ED Notes (Signed)
Physical therapy at bedside at this time. EDP made aware that [place] held due to patient's HR. No new orders.

## 2019-07-31 NOTE — ED Notes (Signed)
Pt moved to hospital bed by this RN, Nira Conn, RN and Sam, RN at this time, pt tolerated well. Call bell placed back in reach of patient. Lights dimmed for comfort. Pt denies further needs.

## 2019-07-31 NOTE — ED Notes (Signed)
This RN to bedside due to patient calling out. Pt requesting heat be turned on. This RN assisted patient with covering up with blankets and increased temp in room. Pt denies further needs at this time.

## 2019-07-31 NOTE — ED Notes (Signed)
Report received from [facility]. Patient care assumed. Patient/RN introduction complete. Will continue to monitor.Pt awaiting hospital bed availability, no complaints of pain or shob at this time.

## 2019-07-31 NOTE — TOC Progression Note (Signed)
Transition of Care [facility]) - Progression Note    Patient Details  Name: Karla Sanchez MRN: [id number] Date of Birth: [birth year]  Transition of Care [facility]) CM/SW [place], RN Phone Number: 07/31/2019, 4:05 PM  Clinical Narrative:    Karla Sanchez with Karla Sanchez, SW from [place] and confirmed APS referral had been completed by HHC-SW due to concerns regarding mobility, medication compliance, diet-vending machines/takeout, and concerns regarding ability to critically think. SW had discussed with patient possibility of LTC medicaid which allows for a higher income threshhold. RN CM discussed possibility patient may return to hotel with APS following up on [place] as outpatient due to no medical needs requiring hospitalization. Physical therapy completed evaluation and had no recommendations for discharge needs.    Expected Discharge Plan: Long Term Nursing Home Barriers to Discharge: ED Patient Insisting on an Alternate Living Situation/Facility  Expected Discharge Plan and Services Expected Discharge Plan: [place]       Living arrangements for the past 2 months: Hotel/Motel                                       Social Determinants of Health (SDOH) Interventions    Readmission Risk Interventions No flowsheet data found.

## 2019-08-01 DIAGNOSIS — I509 Heart failure, unspecified: Secondary | ICD-10-CM | POA: Diagnosis not present

## 2019-08-01 LAB — GLUCOSE, CAPILLARY
Glucose-Capillary: 105 mg/dL — ABNORMAL HIGH (ref 70–99)
Glucose-Capillary: 147 mg/dL — ABNORMAL HIGH (ref 70–99)

## 2019-08-01 NOTE — ED Notes (Signed)
Pt given meal tray and diet coke to drink. Pt requesting not to get prescribed 1 unit of insulin at this time. MD aware. Social worker at bedside at this time with patient.

## 2019-08-01 NOTE — TOC Progression Note (Addendum)
Transition of Care [facility]) - Progression Note    Patient Details  Name: Karla Sanchez MRN: [id number] Date of Birth: 05-13-1940  Transition of Care [facility]) CM/SW Contact  Anselm Pancoast, RN Phone Number: 08/01/2019, 9:05 AM  Clinical Narrative:    Sent secure email to APS supervisor, Stephanie Coup to follow up on previous referral from [place] as patient does not appear to meet criteria for LTC medicaid and is unable to afford placement otherwise. Patient has no family able/willing to assist with her care and states she is unable to afford continued stay at [facility]. Possible discharge to another cheaper hotel.    Expected Discharge Plan: Long Term Nursing Home Barriers to Discharge: ED Patient Insisting on an Alternate Living Situation/Facility  Expected Discharge Plan and Services Expected Discharge Plan: [place]       Living arrangements for the past 2 months: Hotel/Motel                                       Social Determinants of Health (SDOH) Interventions    Readmission Risk Interventions No flowsheet data found.

## 2019-08-01 NOTE — ED Notes (Signed)
Dietary called for pt dinner tray

## 2019-08-01 NOTE — ED Notes (Signed)
Pt resting comfortably with eyes closed, no distress noted.  

## 2019-08-01 NOTE — Consult Note (Signed)
Consultation Note Date: 08/01/2019   Patient Name: Karla Sanchez  DOB: [birth year]  MRN: [id number]  Age / Sex: 79 y.o., female   PCP: Gladstone Lighter, MD Referring Physician: No att. providers found   REASON FOR CONSULTATION:Establishing goals of care  Palliative Care consult requested for goals of care discussion in this 79 y.o. female with multiple medical problems including diastolic congestive heart failure, COPD, HTN, insulin dependent DM, gout, and s/p R and L colectomy in January for colon cancer [facility]) with end-to-end anastomosis. She presented to ED with complaints of worsening shortness of breath. Patient recently admitted in April 2021 with similar presentation. She resides in a hotel and has difficulty with home care.   Clinical Assessment and Goals of Care: I have reviewed medical records including lab results, imaging, Epic notes, and MAR, received report from the bedside RN, and assessed the patient. I met at the bedside with her to discuss diagnosis prognosis, GOC, EOL wishes, disposition and options.  Patient is familiar to our team and was seen during her previous admission. I re-introduced Palliative Medicine as specialized medical care for people living with serious illness. It focuses on providing relief from the symptoms and stress of a serious illness. The goal is to improve quality of life for both the patient and the family. [name] verbalized understanding and appreciation.   We discussed a brief life review of the patient, along with her functional and nutritional status. [name] reports she is residing at the local [employer] [facility] due. She states she has a daughter who lives in [place] but travels the world and they have a distant relationship. She has 2 sisters who are both sickly (one is in a SNF with dementia). She has a nephew [name] who is her main and only family support when he cans. [place]   [name] reports her husband passed away suddenly 2 years ago and her life has been a disaster since. Sharing they purchased a handicap [name] as she has been wheelchair bound for almost 15 years. She reports they spent their entire savings on the van to prevent debt and month payments and he passed away shortly after. Sharing she then had to give up her home and other assets to make ends meet. States she has been in multiple facility but removed herself due to feelings of independence.   Prior to admission she reports being able to provide care for herself and transfer as needed from bed to wheelchair. She performed all ADLs independently as much as she could. Reports appetite was good and often ate out with fast food.   We discussed Her current illness and what it means in the larger context of Her on-going co-morbidities. With specific discussions regarding worsening heart and renal failure. Natural disease trajectory and expectations at EOL were discussed.  Patient verbalized understanding of current illness and co-morbidities. She shares her experience with colon cancer requiring surgery back in January. She reports "I know my health is not good and I am ok with it. I am ready when it is time!" Support given and therapeutic listening provided.   I attempted to elicit values and goals of care important to the patient.    The difference between aggressive medical intervention and comfort care was considered in light of the patient's goals of care. [name] reports she wishes to continue to treat the treatable conditions knowing her  heart and renal failure with never be "cured". She reports she has a strong faith in God and does not wish to prolong her life when it is time.   I created space and opportunity for patient to further discuss feelings. She reports "Life is beautiful as long as you have one. I have some serious life-threatening illnesses and I have no family!  That means quality of life is very minimal to me!" Support given.   She expresses she wishes to live as long as she can but again emphasizes no aggressive interventions or wishes to prolong.   Detailed discussions regarding Advanced directives, concepts specific to code status, artifical feeding and hydration, and rehospitalization. Patient does have a documented advanced directive. She reports her nephew [name] is her HCPOA. She confirms wishes for DNR/DNI, again expressing no aggressive interventions or surgeries.   She states she would never want to start HD or have artificial feedings. She states "when that time comes, that is the time when life is over, call hospice, and let nature take it's course!" support given. Although not seen in chart patient confirms completion of advanced directives during previous admission as noted with no needed changes or updates:   Cardiopulmonary Resuscitation: Do Not Attempt Resuscitation (DNR/No CPR)  Medical Interventions: Limited Additional Interventions: Use medical treatment, IV fluids and cardiac monitoring as indicated, DO NOT USE intubation or mechanical ventilation. May consider use of less invasive airway support such as BiPAP or CPAP. Also provide comfort measures. Transfer to the hospital if indicated. Avoid intensive care.   Antibiotics: Antibiotics if indicated  IV Fluids: IV fluids if indicated  Feeding Tube: No feeding tube    Hospice and Palliative Care services outpatient were explained and offered. Patient and family verbalized their understanding and awareness of both palliative and hospice's goals and philosophy of care. At this time she is open to outpatient palliative with understanding she may transition care to a more comfort/hospice approach at anytime it is appropriate and aligned with her goals.   Questions and concerns were addressed. The family was encouraged to call with questions or concerns.  PMT will continue to support  holistically.   SOCIAL HISTORY:     reports that she has never smoked. She has never used smokeless tobacco. She reports previous alcohol use. She reports that she does not use drugs.  CODE STATUS: DNR  ADVANCE DIRECTIVES: Patient/Nephew (POA) [name])   SYMPTOM MANAGEMENT: per attending   Palliative Prophylaxis:   Bowel Regimen and Frequent Pain Assessment  PSYCHO-SOCIAL/SPIRITUAL:  Support System: N/A  Desire for further Chaplaincy support: NO   Additional Recommendations (Limitations, Scope, Preferences):  No Artificial Feeding, No Hemodialysis, No Surgical Procedures, No Tracheostomy and Treat the treatable with no aggressive interventions, DNR   PAST MEDICAL HISTORY: Past Medical History:  Diagnosis Date  . Colon cancer [facility])    s/p right and left colectomy 2020  . COPD (chronic obstructive pulmonary disease) ([place])   . Diabetes mellitus without complication ([place])   . Diastolic heart failure ([place])   . Gout   . Hypertension     PAST SURGICAL HISTORY:  Past Surgical History:  Procedure Laterality Date  . COLON SURGERY      ALLERGIES:  is allergic to other; exenatide; levofloxacin; losartan; codeine; and tetracyclines & related.   MEDICATIONS:  Current Facility-Administered Medications  Medication Dose Route Frequency Provider Last Rate Last Admin  . albuterol (VENTOLIN HFA) 108 (90 Base) MCG/ACT inhaler 2 puff  2 puff Inhalation  Q6H PRN Delman Kitten, MD      . allopurinol (ZYLOPRIM) tablet 100 mg  100 mg Oral Daily Delman Kitten, MD   100 mg at 07/31/19 0931  . furosemide (LASIX) tablet 40 mg  40 mg Oral Daily Delman Kitten, MD   40 mg at 08/01/19 1011  . insulin aspart (novoLOG) injection 0-5 Units  0-5 Units Subcutaneous QHS Vanessa Lisbon, MD      . insulin aspart (novoLOG) injection 0-9 Units  0-9 Units Subcutaneous TID WC Vanessa Singac, MD   1 Units at 07/31/19 1844  . insulin glargine (LANTUS) injection 15 Units  15 Units Subcutaneous QHS Delman Kitten,  MD   15 Units at 07/31/19 2254  . metoprolol tartrate (LOPRESSOR) tablet 50 mg  50 mg Oral BID Delman Kitten, MD   50 mg at 08/01/19 1010   Current Outpatient Medications  Medication Sig Dispense Refill  . albuterol (VENTOLIN HFA) 108 (90 Base) MCG/ACT inhaler Inhale 2 puffs into the lungs every 6 (six) hours as needed for wheezing or shortness of breath. 8 g 1  . allopurinol (ZYLOPRIM) 100 MG tablet Take 1 tablet (100 mg total) by mouth daily. 30 tablet 1  . colchicine 0.6 MG tablet Take 0.6 mg by mouth daily.    . furosemide (LASIX) 40 MG tablet Take 1 tablet (40 mg total) by mouth daily. 30 tablet 1  . insulin glargine (LANTUS) 100 UNIT/ML injection Inject 0.15 mLs (15 Units total) into the skin at bedtime. 10 mL 3  . metoprolol tartrate (LOPRESSOR) 50 MG tablet Take 1 tablet (50 mg total) by mouth 2 (two) times daily. 60 tablet 1  . SYRINGE-NEEDLE, DISP, 3 ML (LUER LOCK SAFETY SYRINGES) 25G X 5/8" 3 ML MISC 15 Units by Does not apply route at bedtime. 100 each 1    VITAL SIGNS: BP (!) 132/53   Pulse 64   Temp 98.2 F (36.8 C) (Oral)   Resp 18   Ht _0  (1.676 m)   Wt 111.6 kg   SpO2 98%   BMI 39.71 kg/m  Filed Weights   07/30/19 1438  Weight: 111.6 kg    Estimated body mass index is 39.71 kg/m as calculated from the following:   Height as of this encounter: _1  (1.676 m).   Weight as of this encounter: 111.6 kg.  LABS: CBC:    Component Value Date/Time   WBC 7.2 07/30/2019 1856   HGB 9.3 (L) 07/30/2019 1856   HCT 33.3 (L) 07/30/2019 1856   PLT 327 07/30/2019 1856   Comprehensive Metabolic Panel:    Component Value Date/Time   NA 142 07/31/2019 0859   K 4.9 07/31/2019 0859   CO2 30 07/31/2019 0859   BUN 49 (H) 07/31/2019 0859   CREATININE 0.94 07/31/2019 0859   ALBUMIN 3.1 (L) 07/09/2019 1526     Review of Systems  Constitutional: Positive for activity change.  Respiratory: Positive for shortness of breath.   Cardiovascular: Positive for leg swelling.   Neurological: Positive for weakness.  Unless otherwise noted, a complete review of systems is negative.  Physical Exam General: NAD, chronically-ill appearing, obese Cardiovascular: regular rate and rhythm Pulmonary: clear ant fields Abdomen: soft, nontender, + bowel sounds Extremities: right foot edema Neurological: A&O x3, mood appropriate   Prognosis: Guarded   Discharge Planning:  To Be Determined with outpatient Palliative   Recommendations:  DNR/DNI-as confirmed  Continue current plan of care per medical team. No escalation of care, no ICU, treat  the treatable if unable to stabilize goal of care would be comfort/EOL.   No HD, no artificial feedings (if needed wishes are for comfort/EOL)  Outpatient palliative support at discharge  Patient remains hopeful for CSW/APS placement at facility.   PMT available as needed. Please call for urgent needs. Goals are clear and set.    Palliative Performance Scale: PPS 30-40%                Patient expressed understanding and was in agreement with this plan.   Thank you for allowing the Palliative Medicine Team to assist in the care of this patient.  Time In: 1325 Time Out: 1430 Time Total: 65 min.   Visit consisted of counseling and education dealing with the complex and emotionally intense issues of symptom management and palliative care in the setting of serious and potentially life-threatening illness.Greater than 50%  of this time was spent counseling and coordinating care related to the above assessment and plan.  Signed by:  Alda Lea, AGPCNP-BC Palliative Medicine Team  Phone: [phone] Fax: [phone] Pager: [phone] Amion: Bjorn Pippin

## 2019-08-01 NOTE — TOC Progression Note (Addendum)
Transition of Care [facility]) - Progression Note    Patient Details  Name: Karla Sanchez MRN: [id number] Date of Birth: [birth year]  [facility]) CM/SW Contact  Anselm Pancoast, RN Phone Number: 08/01/2019, 3:44 PM  Clinical Narrative:    Damaris Schooner with [facility] who is suggesting placement as best place for this patient if possible however patient was able to answer questions appropriately. Possible need for LTC application for medicaid if able to discharge skilled and if deemed appropriate for ALF patient may can discharge to [facility] with special situation approved. TOC RN CM contacted [facility] who requested PT evaluate patient for level of care needed at placement. Possible family care home if unable to find placement.    Expected Discharge Plan: Long Term Nursing Home Barriers to Discharge: ED Patient Insisting on an Alternate Living Situation/Facility  Expected Discharge Plan and Services Expected Discharge Plan: [place]       Living arrangements for the past 2 months: Hotel/Motel                                       Social Determinants of Health (SDOH) Interventions    Readmission Risk Interventions No flowsheet data found.

## 2019-08-01 NOTE — ED Notes (Signed)
Pt given meal tray and grape juice to drink per request.

## 2019-08-01 NOTE — ED Notes (Signed)
Pt repositioned in bed at this time

## 2019-08-01 NOTE — ED Notes (Signed)
Pt refused blood sugar check at this time.

## 2019-08-01 NOTE — ED Notes (Signed)
Pt resting with eyes closed, awaiting bed assignment.

## 2019-08-01 NOTE — TOC Progression Note (Addendum)
Transition of Care [facility]) - Progression Note    Patient Details  Name: Karla Sanchez MRN: [id number] Date of Birth: [birth year]  Transition of Care [facility]) CM/SW [place], RN Phone Number: 08/01/2019, 11:14 AM  Clinical Narrative:    Updated information received from [place], [place], states patient is being followed by [facility], [place], APS SW will visit patient today in the ED for follow up.  Expected Discharge Plan: Long Term Nursing Home Barriers to Discharge: ED Patient Insisting on an Alternate Living Situation/Facility  Expected Discharge Plan and Services Expected Discharge Plan: [place]       Living arrangements for the past 2 months: Hotel/Motel                                       Social Determinants of Health (SDOH) Interventions    Readmission Risk Interventions No flowsheet data found.

## 2019-08-01 NOTE — ED Notes (Signed)
This RN assisted patient to bedside commode. Pt had small bowel movement and urinated. Pericare performed by this RN. Pt assisted back to bed at this time.

## 2019-08-02 DIAGNOSIS — I509 Heart failure, unspecified: Secondary | ICD-10-CM | POA: Diagnosis not present

## 2019-08-02 LAB — GLUCOSE, CAPILLARY
Glucose-Capillary: 170 mg/dL — ABNORMAL HIGH (ref 70–99)
Glucose-Capillary: 150 mg/dL — ABNORMAL HIGH (ref 70–99)
Glucose-Capillary: 120 mg/dL — ABNORMAL HIGH (ref 70–99)
Glucose-Capillary: 158 mg/dL — ABNORMAL HIGH (ref 70–99)

## 2019-08-02 MED ORDER — IBUPROFEN 600 MG PO TABS
600.0000 mg | ORAL_TABLET | Freq: Once | ORAL | Status: AC
  Administered 2019-08-02: 600 mg via ORAL
  Filled 2019-08-02: qty 1

## 2019-08-02 MED ORDER — BLISTEX MEDICATED EX OINT
TOPICAL_OINTMENT | Freq: Once | CUTANEOUS | Status: AC
  Filled 2019-08-02: qty 6.3

## 2019-08-02 MED ORDER — ACETAMINOPHEN 325 MG PO TABS
650.0000 mg | ORAL_TABLET | Freq: Once | ORAL | Status: AC
  Administered 2019-08-02: 650 mg via ORAL
  Filled 2019-08-02: qty 2

## 2019-08-02 NOTE — TOC Progression Note (Signed)
Transition of Care [facility]) - Progression Note    Patient Details  Name: Kameya Aydt MRN: [id number] Date of Birth: 1940-08-16  Transition of Care [facility]) CM/SW Contact  Anselm Pancoast, RN Phone Number: 08/02/2019, 12:26 PM  Clinical Narrative:    Confirmed with PT patient with no skilled needs. Confirmed no availability for patient to admit to SNF. Patient with no additional funds other than 2100/month. Will outreach for possible alternate hotel options or potential FCH/ALF.    Expected Discharge Plan: Long Term Nursing Home Barriers to Discharge: ED Patient Insisting on an Alternate Living Situation/Facility  Expected Discharge Plan and Services Expected Discharge Plan: [place]       Living arrangements for the past 2 months: Hotel/Motel                                       Social Determinants of Health (SDOH) Interventions    Readmission Risk Interventions No flowsheet data found.

## 2019-08-02 NOTE — TOC Progression Note (Addendum)
Transition of Care [facility]) - Progression Note    Patient Details  Name: Karla Sanchez MRN: [id number] Date of Birth: [birth year]  Transition of Care [facility]) CM/SW [place], RN Phone Number: 08/02/2019, 2:49 PM  Clinical Narrative:    Reviewed case with [facility] for potential ALF placement and confirmed patient not financially eligible due to being over the income for Medicaid and unable to secure private pay. Awaiting final decision on potential FCH/Group Home. If unable to be accepted to [facility] will review discharge to alternate options of hotels.   Call to Peggye Form, [place] SW regarding concerns for placement and Margreta Journey is going to outreach to some facilities and try to find options other than hotel.    Expected Discharge Plan: Long Term Nursing Home Barriers to Discharge: ED Patient Insisting on an Alternate Living Situation/Facility  Expected Discharge Plan and Services Expected Discharge Plan: [place]       Living arrangements for the past 2 months: Hotel/Motel                                       Social Determinants of Health (SDOH) Interventions    Readmission Risk Interventions No flowsheet data found.

## 2019-08-02 NOTE — ED Notes (Signed)
PT resting quietly on hospital bed.  NAD noted.  PT requesting [employer] and something for "a pulled muscle in my back".  Will discuss same with MD.  No other needs or c/o voiced at this time.  Will continue to monitor.

## 2019-08-02 NOTE — ED Notes (Signed)
Called to pt room, states she is feeling SHOB again.  Pulse ox placed and noted to be 87%.  Pt placed back on 2L [place], sats recoverd to 98%.  Pt assisted to bedside commode and back to bed.  Pt sitting on bedside at this time.  Will continue to monitor.

## 2019-08-02 NOTE — ED Provider Notes (Signed)
Emergency Medicine Observation Re-evaluation Note  Karla Sanchez is a 79 y.o. female, seen on rounds today.  Pt initially presented to the ED for complaints of Shortness of Breath  Currently, the patient is resting.  Physical Exam  BP (!) 145/74   Pulse 64   Temp 98.2 F (36.8 C) (Oral)   Resp 16   Ht 5\' 6"  (1.676 m)   Wt 111.6 kg   SpO2 97%   BMI 39.71 kg/m   I have reviewed the labs performed to date as well as medications administered while in observation.  Recent changes in the last 24 hours include - none.  Plan   Current plan is for - awaiting assistance for placement with SW/CM assistance.    Lilia Pro., MD 08/02/19 [phone]

## 2019-08-02 NOTE — ED Notes (Signed)
Report received from [employer] Patient care assumed. Patient/RN introduction complete. Will continue to monitor.

## 2019-08-02 NOTE — ED Notes (Signed)
Linen changed and new gown placed on pt

## 2019-08-02 NOTE — ED Notes (Signed)
Patient given breakfast tray.

## 2019-08-02 NOTE — ED Notes (Signed)
Pt talking on the phone, no complaints.

## 2019-08-02 NOTE — ED Notes (Signed)
Pt stating she now wants to be discharged, states has a home in [place] and [place] [facility]. States she no longer wants to be placed. Dr. Jacqualine Code notified.

## 2019-08-02 NOTE — ED Notes (Signed)
Pt up to bedside commode to have BM, formed brown stool noted. Pt awaiting facility placement, will continue to monitor.

## 2019-08-02 NOTE — ED Notes (Signed)
This tech assisted pt to bsc. Performed a complete linen change. Pt agreed to put a purewick and a dry brief on due to having an "accident" pt stated, while she was asleep. Pt didn't have her O2 on, therefore; pt was feeling short of breath when this tech assisted pt to get back to bed. Pt refused to put brief on and purewick. Explained to pt that it is important to keep her O2 so she doesn't feel short of breath again.

## 2019-08-03 DIAGNOSIS — I509 Heart failure, unspecified: Secondary | ICD-10-CM | POA: Diagnosis not present

## 2019-08-03 LAB — GLUCOSE, CAPILLARY
Glucose-Capillary: 111 mg/dL — ABNORMAL HIGH (ref 70–99)
Glucose-Capillary: 156 mg/dL — ABNORMAL HIGH (ref 70–99)

## 2019-08-03 MED ORDER — LORATADINE 10 MG PO TABS
10.0000 mg | ORAL_TABLET | Freq: Every day | ORAL | 1 refills | Status: DC
Start: 2019-08-03 — End: 2019-11-24

## 2019-08-03 NOTE — ED Notes (Signed)
Pt resting comfortably, no change in condition.

## 2019-08-03 NOTE — Discharge Instructions (Addendum)
Continue taking your normal medications as prescribed.  Return to the ER for new, worsening, or persistent severe shortness of breath, chest pain, swelling, or any other new or worsening symptoms that concern you.

## 2019-08-03 NOTE — ED Notes (Signed)
Called in to pts room. Pt states she cannot clean bottom by self. Pt assisted. Pt to chair with 1 assist.

## 2019-08-03 NOTE — ED Notes (Signed)
Pt incontinent of urine, linens changed and pericare done.

## 2019-08-03 NOTE — TOC Transition Note (Addendum)
Transition of Care [facility]) - CM/SW Discharge Note   Patient Details  Name: Karla Sanchez MRN: [id number] Date of Birth: 13-Dec-1940  Transition of Care [facility]) CM/SW Contact:  Anselm Pancoast, RN Phone Number: 08/03/2019, 2:27 PM   Clinical Narrative:    Outreach to [facility] transport @ [phone] w/c [name] transport requires 24-48 hours in advance. TOC RN CM outreached to [facility] transport for possible transport and no [name] transport available.   Left messag for [facility] transport for possible transport home. Unable to provide transportation for this patient.   [facility]. [facility] RN CM provided patient with tunic due to patient concerns of previous dress being soiled.      Barriers to Discharge: ED Patient Insisting on an Alternate Living Situation/Facility   Patient Goals and CMS Choice Patient states their goals for this hospitalization and ongoing recovery are:: Seeking placement into LTC facility      Discharge Placement                       Discharge Plan and Services                                     Social Determinants of Health (SDOH) Interventions     Readmission Risk Interventions No flowsheet data found.

## 2019-08-03 NOTE — ED Notes (Signed)
Pt resting comfortably with eyes closed, call bed within reach.

## 2019-08-03 NOTE — ED Notes (Signed)
Patient discharged home, patient received discharge papers. Patient received belongings and verbalized she has received all of her belongings. Patient appropriate and cooperative. Vital signs taken. NAD noted.

## 2019-08-03 NOTE — ED Provider Notes (Signed)
-----------------------------------------   3:47 PM on 08/03/2019 -----------------------------------------  Per social work, the patient wants to go back to the holiday and, and they have arranged for her to do so.  I reviewed the recent lab work-up.  There are no significant abnormalities.  I reassessed the patient.  She appears comfortable, is breathing without difficulty, and is calm and cooperative.  She confirms that she is feeling well and stable for discharge.  She requests a prescription for Claritin to to some allergies and congestion.  She states that she used to be on potassium, but is no longer.  Her potassium level is currently normal and she does not require potassium repletion.  At this time, she is stable for discharge.  I gave her thorough return precautions and she expressed understanding.   Arta Silence, MD 08/03/19 1549

## 2019-08-03 NOTE — TOC Progression Note (Addendum)
Transition of Care [facility]) - Progression Note    Patient Details  Name: Karla Sanchez MRN: [id number] Date of Birth: 06-28-1940  Transition of Care [facility]) CM/SW [place], RN Phone Number: 08/03/2019, 2:15 PM  Clinical Narrative:    Met with patient at bedside to discuss discharge options including possible group home or ALF and patient is demanding to return to [facility]. Patient is able to answers questions correctly regarding finances, ability to obtain food, and is able to complete her ADLs independently. Patient states she will need transportation to the hotel but once she is there she is able to drive her w/c accessible van to her appointments. Patient confirmed she has financing to [facility] [place] [facility] and is planning to return to [place] where her friends are that assist her. Patient became very angry with Probation officer when discussion developed around discharge concerns of returning to hotel and patient is demanding to return home. TOC RN CM sent update to Peggye Form, [place] social worker of discharge and arrange for transportation to hotel. Writer will outreach to [facility] to confirm room is still available and belongings are still there.   Contacted hotel and confirmed they are expecting patient and have her room ready.   Expected Discharge Plan: Long Term Nursing Home Barriers to Discharge: ED Patient Insisting on an Alternate Living Situation/Facility  Expected Discharge Plan and Services Expected Discharge Plan: [place]       Living arrangements for the past 2 months: Hotel/Motel                                       Social Determinants of Health (SDOH) Interventions    Readmission Risk Interventions No flowsheet data found.

## 2019-08-04 NOTE — Progress Notes (Deleted)
   Patient ID: Karla Sanchez, female    DOB: 12-30-1940, 79 y.o.   MRN: [id number]  HPI  Karla Sanchez is a 79 y/o female with a history of  Echo report from 07/11/19 reviewed and showed an EF of 55-60% along with moderate LVH, moderate LAE and mild MR.   Was in the ED 07/30/19 due to acute on chronic HF. Was in the ED for an extended stay due to social work issues. Treated with IV lasix with transition to oral diuretics and was discharged back to the [facility] she is currently living. Admitted 07/09/19 due to COPD/HF exacerbation. Psychiatry and palliative care consults obtained. Initially given IV lasix with transition to oral diuretics. Initially given oxygen and then weaned off of that. Currently living in a hotel because she sold her house and wants to get into ALF. Discharged after 4 days to hotel.   She presents today for her initial visit with a chief complaint of   Review of Systems    Physical Exam    Assessment & Plan:  1: 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- - NYHA class - BNP 07/30/19 was 161.0  2: HTN- - BP - saw PCP (Eranti) 04/13/19 - BMP 07/31/19 reviewed and showed sodium 142, potassium 4.9, creatinine 0.94 and GFR 58  3: DM- - A1c 07/09/19 was 7.3%  4: COPD-  5: Homelessness-

## 2019-08-07 ENCOUNTER — Ambulatory Visit: Payer: Federal, State, Local not specified - PPO | Admitting: Family

## 2019-08-07 ENCOUNTER — Telehealth (HOSPITAL_COMMUNITY): Payer: Self-pay

## 2019-08-07 ENCOUNTER — Telehealth: Payer: Self-pay | Admitting: Family

## 2019-08-07 NOTE — Telephone Encounter (Signed)
Attempted to contact, no answer and voice mail is not set up.  Will continue to try.   [place] [phone]

## 2019-08-07 NOTE — Telephone Encounter (Signed)
Patient did not show for her Heart Failure Clinic appointment on 08/07/19. Will attempt to reschedule.

## 2019-08-09 ENCOUNTER — Other Ambulatory Visit (HOSPITAL_COMMUNITY): Payer: Self-pay

## 2019-08-09 NOTE — Progress Notes (Signed)
Today went and met Karla Sanchez for the first time.  Discussed the program and she does not mind me visiting with her.  She appears appreciative and was very friendly.  She talked most of the time and I listened.  She is not willing to make any doctor appts right now.  Unknown if she is taking her medications. She has been in [place] for approx 6 week and been to ED twice, admitted once.  She states she lives a place to live.  She lived in [place] for 19 years and wanted to move back to [place].  She is looking for assisted living and possibly skilled nursing.  She is wheel chair bound.  She wants a place to have a her wheel chair accessible van with her.  She states she had a Education officer, museum to visit with her but has not come back.  Was able to get in touch with Gabby with [facility] after the appt and she advised filled out FL2 today and Peggye Form with APS is hoping to get her placed by next week.  We had talked about getting together tomorrow and looking through the computer at places for her to try.  She has income and knows she will have to self pay.  Janace Hoard did advise she may get some assistance. Attempted to Peggye Form no answer left message for her to call me.  Will continue to follow up and try to get a rapport with her.  Will continue to visit for heart failure, medication compliance and diet.  She states eats at the breakfast provided by the hotel then snacks rest of the day.  Her right foot has swelling in it into the ankle, left has no swelling.  No vitals were able to be took.  She is not weighing and states she can not stand on one.   She does have a nurse coming in from [place] checking on her, she is refusing OT and PT. Will continue to try to get her appts made.   [place] [phone]

## 2019-08-10 ENCOUNTER — Other Ambulatory Visit (HOSPITAL_COMMUNITY): Payer: Self-pay

## 2019-08-10 ENCOUNTER — Encounter (HOSPITAL_COMMUNITY): Payer: Self-pay

## 2019-08-10 NOTE — Progress Notes (Signed)
Met with Karla Sanchez to let her know what APS advised me this morning of trying to find her place to live.  She also advised she has some swelling today so she took her lasix.  She is only taking lasix when she thinks she needs it.  She does have about 2 weeks of insulin left but she has refills.  She has needles to take it.  She will not let me check vitals and check her out.  Was successful on getting her to let me get her HF clinic appt in 2 weeks.  Will continue to stress importance of keeping this appt.  Will continue to follow up with her about her housing, medications and getting her established local doctors.   [place] [phone]

## 2019-08-10 NOTE — Progress Notes (Signed)
Was able to contact Social worker with [place] and they have reported her to APS.  Contacted Peggye Form with [place], she visited with her yesterday and she is handling the case to find her a place to live.  Will continue to visit for Heart Failure.   [place] [phone]

## 2019-08-14 ENCOUNTER — Emergency Department: Payer: Medicare Other

## 2019-08-14 ENCOUNTER — Emergency Department
Admission: EM | Admit: 2019-08-14 | Discharge: 2019-08-15 | Disposition: A | Discharge status: Home / Self Care | Payer: Medicare Other | Attending: Emergency Medicine | Admitting: Emergency Medicine

## 2019-08-14 ENCOUNTER — Other Ambulatory Visit: Payer: Self-pay

## 2019-08-14 DIAGNOSIS — J441 Chronic obstructive pulmonary disease with (acute) exacerbation: Secondary | ICD-10-CM | POA: Insufficient documentation

## 2019-08-14 DIAGNOSIS — E119 Type 2 diabetes mellitus without complications: Secondary | ICD-10-CM | POA: Diagnosis not present

## 2019-08-14 DIAGNOSIS — I5030 Unspecified diastolic (congestive) heart failure: Secondary | ICD-10-CM | POA: Insufficient documentation

## 2019-08-14 DIAGNOSIS — I110 Hypertensive heart disease with heart failure: Secondary | ICD-10-CM | POA: Diagnosis not present

## 2019-08-14 DIAGNOSIS — R0602 Shortness of breath: Secondary | ICD-10-CM | POA: Diagnosis present

## 2019-08-14 MED ORDER — IPRATROPIUM-ALBUTEROL 0.5-2.5 (3) MG/3ML IN SOLN
3.0000 mL | Freq: Once | RESPIRATORY_TRACT | Status: AC
  Administered 2019-08-15: 3 mL via RESPIRATORY_TRACT
  Filled 2019-08-14: qty 3

## 2019-08-14 MED ORDER — PREDNISONE 20 MG PO TABS
60.0000 mg | ORAL_TABLET | Freq: Once | ORAL | Status: AC
  Administered 2019-08-15: 60 mg via ORAL
  Filled 2019-08-14: qty 3

## 2019-08-14 MED ORDER — FUROSEMIDE 40 MG PO TABS
80.0000 mg | ORAL_TABLET | Freq: Once | ORAL | Status: AC
  Administered 2019-08-15: 80 mg via ORAL
  Filled 2019-08-14: qty 2

## 2019-08-14 NOTE — ED Notes (Signed)
Pt refuses IV access at this time

## 2019-08-14 NOTE — ED Triage Notes (Signed)
Pt arrives via [facility] from the [facility] where she lives alone.  She states she was here 2 weeks ago for the same complaint and today it has been gradually getting worse.  EMS states she was 90% on room air but they state she refused all treatment from them.  Pt is 88-91% on room air upon arrival, increasing to 96% on 2L. Pt states she does not need O2 at home.  Hx of CHF, diabetes, gout.  Pt is non-ambulatory, uses wheelchair.

## 2019-08-15 ENCOUNTER — Encounter (HOSPITAL_COMMUNITY): Payer: Self-pay

## 2019-08-15 DIAGNOSIS — J441 Chronic obstructive pulmonary disease with (acute) exacerbation: Secondary | ICD-10-CM | POA: Diagnosis not present

## 2019-08-15 LAB — BASIC METABOLIC PANEL
Anion gap: 5 (ref 5–15)
BUN: 37 mg/dL — ABNORMAL HIGH (ref 8–23)
CO2: 29 mmol/L (ref 22–32)
Calcium: 8.3 mg/dL — ABNORMAL LOW (ref 8.9–10.3)
Chloride: 105 mmol/L (ref 98–111)
Creatinine, Ser: 1.07 mg/dL — ABNORMAL HIGH (ref 0.44–1.00)
GFR calc Af Amer: 57 mL/min — ABNORMAL LOW (ref >60)
GFR calc non Af Amer: 49 mL/min — ABNORMAL LOW (ref >60)
Glucose, Bld: 95 mg/dL (ref 70–99)
Potassium: 5 mmol/L (ref 3.5–5.1)
Sodium: 139 mmol/L (ref 135–145)

## 2019-08-15 LAB — CBC WITH DIFFERENTIAL/PLATELET
Abs Immature Granulocytes: 0.03 10*3/uL (ref 0.00–0.07)
Basophils Absolute: 0.1 10*3/uL (ref 0.0–0.1)
Basophils Relative: 1 %
Eosinophils Absolute: 0.5 10*3/uL (ref 0.0–0.5)
Eosinophils Relative: 5 %
HCT: 33.4 % — ABNORMAL LOW (ref 36.0–46.0)
Hemoglobin: 9.5 g/dL — ABNORMAL LOW (ref 12.0–15.0)
Immature Granulocytes: 0 %
Lymphocytes Relative: 33 %
Lymphs Abs: 2.9 10*3/uL (ref 0.7–4.0)
MCH: 19.8 pg — ABNORMAL LOW (ref 26.0–34.0)
MCHC: 28.4 g/dL — ABNORMAL LOW (ref 30.0–36.0)
MCV: 69.6 fL — ABNORMAL LOW (ref 80.0–100.0)
Monocytes Absolute: 0.8 10*3/uL (ref 0.1–1.0)
Monocytes Relative: 9 %
Neutro Abs: 4.6 10*3/uL (ref 1.7–7.7)
Neutrophils Relative %: 52 %
Platelets: 351 10*3/uL (ref 150–400)
RBC: 4.8 MIL/uL (ref 3.87–5.11)
RDW: 19.9 % — ABNORMAL HIGH (ref 11.5–15.5)
WBC: 8.9 10*3/uL (ref 4.0–10.5)
nRBC: 0 % (ref 0.0–0.2)

## 2019-08-15 LAB — BRAIN NATRIURETIC PEPTIDE: B Natriuretic Peptide: 112.7 pg/mL — ABNORMAL HIGH (ref 0.0–100.0)

## 2019-08-15 LAB — TROPONIN I (HIGH SENSITIVITY): Troponin I (High Sensitivity): 11 ng/L (ref <18)

## 2019-08-15 MED ORDER — PREDNISONE 20 MG PO TABS: 60.0000 mg | ORAL_TABLET | Freq: Every day | ORAL | 0 refills | Status: AC

## 2019-08-15 NOTE — ED Provider Notes (Signed)
[facility] Emergency Department Provider Note  ____________________________________________  Time seen: Approximately 12:49 AM  I have reviewed the triage vital signs and the nursing notes.   HISTORY  Chief Complaint Shortness of Breath   HPI Karla Sanchez is a 79 y.o. female with a history of colon cancer status post colectomy, COPD, diabetes, diastolic CHF, hypertension, anemia who presents for evaluation of shortness of breath.  Patient reports that she has been short of breath since she was last seen here 2 weeks ago however this evening became worse.  She is complaining of orthopnea, significant wheezing.  No cough, no fever, no chest pain.  Patient does not have oxygen at baseline.  Her shortness of breath is now constant and severe.  No Covid vaccines.  No Covid-like symptoms.   Past Medical History:  Diagnosis Date  . Colon cancer [facility])    s/p right and left colectomy 2020  . COPD (chronic obstructive pulmonary disease) ([place])   . Diabetes mellitus without complication ([place])   . Diastolic heart failure ([place])   . Gout   . Hypertension     Patient Active Problem List   Diagnosis Date Noted  . COPD exacerbation ([place]) 07/09/2019  . Acute on chronic diastolic congestive heart failure ([place]) 07/09/2019  . DM (diabetes mellitus) ([place]) 07/09/2019  . Peripheral edema 07/09/2019  . Gout 07/09/2019  . Obesity, diabetes, and hypertension syndrome ([place]) 07/09/2019  . Anemia 07/09/2019    Past Surgical History:  Procedure Laterality Date  . COLON SURGERY      Prior to Admission medications   Medication Sig Start Date End Date Taking? Authorizing Provider  albuterol (VENTOLIN HFA) 108 (90 Base) MCG/ACT inhaler Inhale 2 puffs into the lungs every 6 (six) hours as needed for wheezing or shortness of breath. 07/13/19   Lorella Nimrod, MD  allopurinol (ZYLOPRIM) 100 MG tablet Take 1 tablet (100 mg total) by mouth daily. 07/14/19   Lorella Nimrod, MD    colchicine 0.6 MG tablet Take 0.6 mg by mouth daily. 04/13/19   [provider]  furosemide (LASIX) 40 MG tablet Take 1 tablet (40 mg total) by mouth daily. 07/13/19 08/12/19  Lorella Nimrod, MD  insulin glargine (LANTUS) 100 UNIT/ML injection Inject 0.15 mLs (15 Units total) into the skin at bedtime. 07/13/19   Lorella Nimrod, MD  loratadine (CLARITIN) 10 MG tablet Take 1 tablet (10 mg total) by mouth daily. 08/03/19 10/02/19  Arta Silence, MD  metoprolol tartrate (LOPRESSOR) 50 MG tablet Take 1 tablet (50 mg total) by mouth 2 (two) times daily. 07/13/19   Lorella Nimrod, MD  predniSONE (DELTASONE) 20 MG tablet Take 3 tablets (60 mg total) by mouth daily for 4 days. 08/15/19 08/19/19  Alfred Levins, [place], MD  SYRINGE-NEEDLE, DISP, 3 ML (LUER LOCK SAFETY SYRINGES) 25G X 5/8" 3 ML MISC 15 Units by Does not apply route at bedtime. 07/13/19   Lorella Nimrod, MD    Allergies Other, Exenatide, Levofloxacin, Losartan, Codeine, and Tetracyclines & related  History reviewed. No pertinent family history.  Social History Social History   Tobacco Use  . Smoking status: Never Smoker  . Smokeless tobacco: Never Used  Substance Use Topics  . Alcohol use: Not Currently  . Drug use: Never    Review of Systems  Constitutional: Negative for fever. Eyes: Negative for visual changes. ENT: Negative for sore throat. Neck: No neck pain  Cardiovascular: Negative for chest pain. Respiratory: + shortness of breath, wheezing Gastrointestinal: Negative for abdominal pain, vomiting  or diarrhea. Genitourinary: Negative for dysuria. Musculoskeletal: Negative for back pain. Skin: Negative for rash. Neurological: Negative for headaches, weakness or numbness. Psych: No SI or HI  ____________________________________________   PHYSICAL EXAM:  VITAL SIGNS: ED Triage Vitals  Enc Vitals Group     BP 08/14/19 2306 (!) 162/55     Pulse Rate 08/14/19 2306 68     Resp 08/14/19 2306 17     Temp 08/14/19  2306 98.5 F (36.9 C)     Temp Source 08/14/19 2306 Oral     SpO2 08/14/19 2306 97 %     Weight 08/14/19 2311 246 lb (111.6 kg)     Height 08/14/19 2311 5\' 6"  (1.676 m)     Head Circumference --      Peak Flow --      Pain Score 08/14/19 2311 10     Pain Loc --      Pain Edu? --      Excl. in [place]? --     Constitutional: Alert and oriented, mild to moderate respiratory distress  HEENT:      Head: Normocephalic and atraumatic.         Eyes: Conjunctivae are normal. Sclera is non-icteric.       Mouth/Throat: Mucous membranes are moist.       Neck: Supple with no signs of meningismus. Cardiovascular: Regular rate and rhythm. No murmurs, gallops, or rubs.  Respiratory: Increased work of breathing, hypoxia to 89% on room air, diffuse wheezing bilaterally  gastrointestinal: Soft, non tender. Musculoskeletal: 2+ pitting edema of the right lower extremity and trace edema of the left which is baseline Neurologic: Normal speech and language. Face is symmetric. Moving all extremities. No gross focal neurologic deficits are appreciated. Skin: Skin is warm, dry and intact. No rash noted. Psychiatric: Mood and affect are normal. Speech and behavior are normal.  ____________________________________________   LABS (all labs ordered are listed, but only abnormal results are displayed)  Labs Reviewed  CBC WITH DIFFERENTIAL/PLATELET - Abnormal; Notable for the following components:      Result Value   Hemoglobin 9.5 (*)    HCT 33.4 (*)    MCV 69.6 (*)    MCH 19.8 (*)    MCHC 28.4 (*)    RDW 19.9 (*)    All other components within normal limits  BASIC METABOLIC PANEL - Abnormal; Notable for the following components:   BUN 37 (*)    Creatinine, Ser 1.07 (*)    Calcium 8.3 (*)    GFR calc non Af Amer 49 (*)    GFR calc Af Amer 57 (*)    All other components within normal limits  BRAIN NATRIURETIC PEPTIDE - Abnormal; Notable for the following components:   B Natriuretic Peptide 112.7 (*)      All other components within normal limits  TROPONIN I (HIGH SENSITIVITY)  TROPONIN I (HIGH SENSITIVITY)   ____________________________________________  EKG  ED ECG REPORT I, Rudene Re, the attending physician, personally viewed and interpreted this ECG.  Normal sinus rhythm, rate of 67, RBBB, LAFB, left axis deviation, normal QTC, no ST elevations or depressions.  Unchanged from prior. ____________________________________________  RADIOLOGY  I have personally reviewed the images performed during this visit and I agree with the Radiologist's read.   Interpretation by Radiologist:  DG Chest Portable 1 View  Result Date: 08/14/2019 CLINICAL DATA:  Shortness of breath EXAM: PORTABLE CHEST 1 VIEW COMPARISON:  07/30/2019 FINDINGS: The heart size and mediastinal contours are within  normal limits. Both lungs are clear. The visualized skeletal structures are unremarkable. IMPRESSION: No active disease. Electronically Signed   By: Ulyses Jarred M.D.   On: 08/14/2019 23:34     ____________________________________________   PROCEDURES  Procedure(s) performed:yes .1-3 Lead EKG Interpretation Performed by: Rudene Re, MD Authorized by: Rudene Re, MD     Interpretation: abnormal     ECG rate:  67   Rhythm: sinus rhythm     Conduction normal: RBBB.     Critical Care performed:  None ____________________________________________   INITIAL IMPRESSION / ASSESSMENT AND PLAN / ED COURSE   79 y.o. female with a history of colon cancer status post colectomy, COPD, diabetes, diastolic CHF, hypertension, anemia who presents for evaluation of shortness of breath x 2 weeks.  Patient in mild to moderate respiratory distress, hypoxic to 89% on room air with significant wheezing bilaterally.  Has asymmetric leg swelling which is chronic for her and with recent ultrasound showing no signs of DVT.    Differential diagnoses including COPD exacerbation versus CHF  exacerbation versus pneumonia versus PE.  Chest x-ray visualized by me with no evidence of pneumonia or CHF exacerbation, confirmed by radiology.  Labs showing no leukocytosis.  Mildly elevated BNP of 112 which is the lowest she has had for [place] here.  Anemia stable.  Kidney function is stable.  No significant electrolyte derangements.  Patient was started on duo nebs x3 and prednisone.  She was also given a dose of p.o. Lasix.  Patient has been seen here several times in the past and never allows for an IV placement.  I tried to explain to the patient that by giving her IV steroids and Lasix I would be able to make her better faster and also that we should do a CT of her chest to rule out a blood clot in the setting of known colon cancer.  Patient started screaming at me "you are harassing me, I do not want an IV, I do not care with treatment or test you need to do, just do it without an IV."  I explained to the patient that unfortunately she needs IV contrast to be able to do the CT.  She refuses the CT and reports that she will not allow an IV to be placed because she just does not like IVs.  She understands that by having an undiagnosed pulmonary embolism she can have significant morbidity and possible death.  Will reassess patient after the above treatments.  Old medical records reviewed.  Patient placed on telemetry for close monitoring.     _________________________ 1:24 AM on 08/15/2019 -----------------------------------------  Patient feels markedly improved, no longer wheezing, no longer hypoxic satting 93 to 96% on room air.  Plan to discharge home on steroids and follow-up with primary care doctor.  Discussed standard return precautions for new or worsening shortness of breath or wheezing.  _____________________________________________ Please note:  Patient was evaluated in Emergency Department today for the symptoms described in the history of present illness. Patient was evaluated in the  context of the global COVID-19 pandemic, which necessitated consideration that the patient might be at risk for infection with the SARS-CoV-2 virus that causes COVID-19. Institutional protocols and algorithms that pertain to the evaluation of patients at risk for COVID-19 are in a state of rapid change based on information released by regulatory bodies including the CDC and federal and state organizations. These policies and algorithms were followed during the patient's care in the ED.  Some  ED evaluations and interventions may be delayed as a result of limited staffing during the pandemic.   [place] Controlled Substance Database was reviewed by me. ____________________________________________   FINAL CLINICAL IMPRESSION(S) / ED DIAGNOSES   Final diagnoses:  COPD exacerbation ([place])      NEW MEDICATIONS STARTED DURING THIS VISIT:  ED Discharge Orders         Ordered    predniSONE (DELTASONE) 20 MG tablet  Daily     08/15/19 0124           Note:  This document was prepared using Dragon voice recognition software and may include unintentional dictation errors.    Alfred Levins, [place], MD 08/15/19 [phone]

## 2019-08-15 NOTE — ED Notes (Signed)
Hall bed, signature pad not available.  Discharge instructions reviewed with patient; patient verbalized understanding.

## 2019-08-15 NOTE — Progress Notes (Signed)
Have spoke with Zennia, her [place] worker [place] and APS with [facility] past couple of days.  Seen where she was at ED, will make sure she was able to get her prescription.  APS is working on emergency placement at [facility] or Assisted living.  They are also going out to meet with her again today and supply her with some depends, she was running out of them.  She has no transportation at this time, her license is expired nad she stated she does not feel safe driving.  Will continue to follow up on her situation.   Angelina [phone]

## 2019-08-15 NOTE — ED Notes (Signed)
Pt refused changing of briefs prior to leaving; stated she would take care of it when she got home.

## 2019-08-23 ENCOUNTER — Ambulatory Visit: Payer: Federal, State, Local not specified - PPO | Admitting: Family

## 2019-09-06 ENCOUNTER — Other Ambulatory Visit: Payer: Self-pay

## 2019-09-06 ENCOUNTER — Emergency Department: Payer: Medicare Other

## 2019-09-06 DIAGNOSIS — Z5320 Procedure and treatment not carried out because of patient's decision for unspecified reasons: Secondary | ICD-10-CM | POA: Diagnosis present

## 2019-09-06 DIAGNOSIS — Z6839 Body mass index (BMI) 39.0-39.9, adult: Secondary | ICD-10-CM

## 2019-09-06 DIAGNOSIS — Z9114 Patient's other noncompliance with medication regimen: Secondary | ICD-10-CM

## 2019-09-06 DIAGNOSIS — R7989 Other specified abnormal findings of blood chemistry: Secondary | ICD-10-CM | POA: Diagnosis not present

## 2019-09-06 DIAGNOSIS — I4510 Unspecified right bundle-branch block: Secondary | ICD-10-CM | POA: Diagnosis present

## 2019-09-06 DIAGNOSIS — Z20822 Contact with and (suspected) exposure to covid-19: Secondary | ICD-10-CM | POA: Diagnosis present

## 2019-09-06 DIAGNOSIS — I2721 Secondary pulmonary arterial hypertension: Secondary | ICD-10-CM | POA: Diagnosis present

## 2019-09-06 DIAGNOSIS — Z885 Allergy status to narcotic agent status: Secondary | ICD-10-CM

## 2019-09-06 DIAGNOSIS — I110 Hypertensive heart disease with heart failure: Principal | ICD-10-CM | POA: Diagnosis present

## 2019-09-06 DIAGNOSIS — E875 Hyperkalemia: Secondary | ICD-10-CM | POA: Diagnosis present

## 2019-09-06 DIAGNOSIS — I5033 Acute on chronic diastolic (congestive) heart failure: Secondary | ICD-10-CM | POA: Diagnosis present

## 2019-09-06 DIAGNOSIS — T501X5A Adverse effect of loop [high-ceiling] diuretics, initial encounter: Secondary | ICD-10-CM | POA: Diagnosis not present

## 2019-09-06 DIAGNOSIS — E119 Type 2 diabetes mellitus without complications: Secondary | ICD-10-CM | POA: Diagnosis present

## 2019-09-06 DIAGNOSIS — N179 Acute kidney failure, unspecified: Secondary | ICD-10-CM | POA: Diagnosis present

## 2019-09-06 DIAGNOSIS — Z79899 Other long term (current) drug therapy: Secondary | ICD-10-CM

## 2019-09-06 DIAGNOSIS — Y92239 Unspecified place in hospital as the place of occurrence of the external cause: Secondary | ICD-10-CM | POA: Diagnosis not present

## 2019-09-06 DIAGNOSIS — Z9119 Patient's noncompliance with other medical treatment and regimen: Secondary | ICD-10-CM

## 2019-09-06 DIAGNOSIS — Z888 Allergy status to other drugs, medicaments and biological substances status: Secondary | ICD-10-CM

## 2019-09-06 DIAGNOSIS — Z66 Do not resuscitate: Secondary | ICD-10-CM | POA: Diagnosis present

## 2019-09-06 DIAGNOSIS — M109 Gout, unspecified: Secondary | ICD-10-CM | POA: Diagnosis present

## 2019-09-06 DIAGNOSIS — Z85038 Personal history of other malignant neoplasm of large intestine: Secondary | ICD-10-CM

## 2019-09-06 DIAGNOSIS — R0602 Shortness of breath: Secondary | ICD-10-CM | POA: Diagnosis not present

## 2019-09-06 DIAGNOSIS — J9611 Chronic respiratory failure with hypoxia: Secondary | ICD-10-CM | POA: Diagnosis present

## 2019-09-06 DIAGNOSIS — Z794 Long term (current) use of insulin: Secondary | ICD-10-CM

## 2019-09-06 DIAGNOSIS — J441 Chronic obstructive pulmonary disease with (acute) exacerbation: Secondary | ICD-10-CM | POA: Diagnosis present

## 2019-09-06 LAB — CBC
HCT: 37.2 % (ref 36.0–46.0)
Hemoglobin: 10.5 g/dL — ABNORMAL LOW (ref 12.0–15.0)
MCH: 19.7 pg — ABNORMAL LOW (ref 26.0–34.0)
MCHC: 28.2 g/dL — ABNORMAL LOW (ref 30.0–36.0)
MCV: 69.9 fL — ABNORMAL LOW (ref 80.0–100.0)
Platelets: 364 10*3/uL (ref 150–400)
RBC: 5.32 MIL/uL — ABNORMAL HIGH (ref 3.87–5.11)
RDW: 21.5 % — ABNORMAL HIGH (ref 11.5–15.5)
WBC: 8.1 10*3/uL (ref 4.0–10.5)
nRBC: 0 % (ref 0.0–0.2)

## 2019-09-06 LAB — BASIC METABOLIC PANEL
Anion gap: 8 (ref 5–15)
BUN: 30 mg/dL — ABNORMAL HIGH (ref 8–23)
CO2: 28 mmol/L (ref 22–32)
Calcium: 8.3 mg/dL — ABNORMAL LOW (ref 8.9–10.3)
Chloride: 103 mmol/L (ref 98–111)
Creatinine, Ser: 1.12 mg/dL — ABNORMAL HIGH (ref 0.44–1.00)
GFR calc Af Amer: 54 mL/min — ABNORMAL LOW (ref >60)
GFR calc non Af Amer: 47 mL/min — ABNORMAL LOW (ref >60)
Glucose, Bld: 104 mg/dL — ABNORMAL HIGH (ref 70–99)
Potassium: 5 mmol/L (ref 3.5–5.1)
Sodium: 139 mmol/L (ref 135–145)

## 2019-09-06 LAB — BRAIN NATRIURETIC PEPTIDE: B Natriuretic Peptide: 171.6 pg/mL — ABNORMAL HIGH (ref 0.0–100.0)

## 2019-09-06 LAB — TROPONIN I (HIGH SENSITIVITY): Troponin I (High Sensitivity): 13 ng/L (ref <18)

## 2019-09-06 NOTE — ED Triage Notes (Signed)
PT to ED via EMS from home c/o [facility] and R leg swelling. PT states SOB on and off for 11 years and leg has been swollen for 3 months and appears red. PT used ot be on oxygen but "they took it away". PT on oxygen today satting 98%. Able to speak in full sentences, no relief from at home inhaler.

## 2019-09-06 NOTE — ED Triage Notes (Signed)
Pt in via EMS from home with c/o increased SOB. Pt on 3L of oxygen 96%.

## 2019-09-07 ENCOUNTER — Encounter: Payer: Self-pay | Admitting: Family Medicine

## 2019-09-07 ENCOUNTER — Emergency Department: Payer: Medicare Other

## 2019-09-07 ENCOUNTER — Inpatient Hospital Stay
Admission: EM | Admit: 2019-09-07 | Discharge: 2019-09-12 | DRG: 292 | Disposition: A | Discharge status: Skilled Nursing Facility | Payer: Medicare Other | Attending: Internal Medicine | Admitting: Internal Medicine

## 2019-09-07 ENCOUNTER — Observation Stay: Payer: Medicare Other

## 2019-09-07 DIAGNOSIS — E119 Type 2 diabetes mellitus without complications: Secondary | ICD-10-CM

## 2019-09-07 DIAGNOSIS — R0602 Shortness of breath: Secondary | ICD-10-CM

## 2019-09-07 DIAGNOSIS — M1000 Idiopathic gout, unspecified site: Secondary | ICD-10-CM | POA: Diagnosis not present

## 2019-09-07 DIAGNOSIS — I509 Heart failure, unspecified: Secondary | ICD-10-CM

## 2019-09-07 DIAGNOSIS — E1169 Type 2 diabetes mellitus with other specified complication: Secondary | ICD-10-CM

## 2019-09-07 DIAGNOSIS — I10 Essential (primary) hypertension: Secondary | ICD-10-CM

## 2019-09-07 DIAGNOSIS — I5033 Acute on chronic diastolic (congestive) heart failure: Secondary | ICD-10-CM

## 2019-09-07 DIAGNOSIS — M7989 Other specified soft tissue disorders: Secondary | ICD-10-CM

## 2019-09-07 DIAGNOSIS — E139 Other specified diabetes mellitus without complications: Secondary | ICD-10-CM

## 2019-09-07 LAB — TROPONIN I (HIGH SENSITIVITY): Troponin I (High Sensitivity): 10 ng/L (ref <18)

## 2019-09-07 LAB — GLUCOSE, CAPILLARY
Glucose-Capillary: 161 mg/dL — ABNORMAL HIGH (ref 70–99)
Glucose-Capillary: 122 mg/dL — ABNORMAL HIGH (ref 70–99)
Glucose-Capillary: 99 mg/dL (ref 70–99)

## 2019-09-07 LAB — SARS CORONAVIRUS 2 BY RT PCR (HOSPITAL ORDER, PERFORMED IN ~~LOC~~ HOSPITAL LAB): SARS Coronavirus 2: NEGATIVE

## 2019-09-07 MED ORDER — FUROSEMIDE 10 MG/ML IJ SOLN
40.0000 mg | Freq: Two times a day (BID) | INTRAMUSCULAR | Status: DC
  Administered 2019-09-07 – 2019-09-11 (×3): 40 mg via INTRAVENOUS
  Filled 2019-09-07 (×6): qty 4

## 2019-09-07 MED ORDER — ENOXAPARIN SODIUM 40 MG/0.4ML ~~LOC~~ SOLN
40.0000 mg | SUBCUTANEOUS | Status: DC
  Filled 2019-09-07 (×2): qty 0.4

## 2019-09-07 MED ORDER — ALLOPURINOL 100 MG PO TABS
100.0000 mg | ORAL_TABLET | Freq: Every day | ORAL | Status: DC
  Administered 2019-09-08 – 2019-09-12 (×5): 100 mg via ORAL
  Filled 2019-09-07 (×6): qty 1

## 2019-09-07 MED ORDER — FUROSEMIDE 10 MG/ML IJ SOLN: 40.0000 mg | Freq: Once | INTRAMUSCULAR | Status: DC

## 2019-09-07 MED ORDER — ALPRAZOLAM 0.25 MG PO TABS: 0.2500 mg | ORAL_TABLET | Freq: Two times a day (BID) | ORAL | Status: DC | PRN

## 2019-09-07 MED ORDER — METOPROLOL TARTRATE 50 MG PO TABS
50.0000 mg | ORAL_TABLET | Freq: Two times a day (BID) | ORAL | Status: DC
  Administered 2019-09-07 – 2019-09-12 (×8): 50 mg via ORAL
  Filled 2019-09-07 (×10): qty 1

## 2019-09-07 MED ORDER — ALBUTEROL SULFATE (2.5 MG/3ML) 0.083% IN NEBU: 2.5000 mg | INHALATION_SOLUTION | Freq: Four times a day (QID) | RESPIRATORY_TRACT | Status: DC | PRN

## 2019-09-07 MED ORDER — INSULIN ASPART 100 UNIT/ML ~~LOC~~ SOLN: 0.0000 [IU] | Freq: Three times a day (TID) | SUBCUTANEOUS | Status: DC

## 2019-09-07 MED ORDER — INSULIN GLARGINE 100 UNIT/ML ~~LOC~~ SOLN
15.0000 [IU] | Freq: Every day | SUBCUTANEOUS | Status: DC
  Administered 2019-09-08 – 2019-09-11 (×2): 15 [IU] via SUBCUTANEOUS
  Filled 2019-09-07 (×6): qty 0.15

## 2019-09-07 MED ORDER — ZOLPIDEM TARTRATE 5 MG PO TABS: 5.0000 mg | ORAL_TABLET | Freq: Every evening | ORAL | Status: DC | PRN

## 2019-09-07 MED ORDER — IPRATROPIUM-ALBUTEROL 0.5-2.5 (3) MG/3ML IN SOLN
3.0000 mL | Freq: Once | RESPIRATORY_TRACT | Status: AC
  Administered 2019-09-07: 3 mL via RESPIRATORY_TRACT
  Filled 2019-09-07: qty 3

## 2019-09-07 MED ORDER — SODIUM CHLORIDE 0.9% FLUSH: 3.0000 mL | INTRAVENOUS | Status: DC | PRN

## 2019-09-07 MED ORDER — ACETAMINOPHEN 325 MG PO TABS: 650.0000 mg | ORAL_TABLET | ORAL | Status: DC | PRN

## 2019-09-07 MED ORDER — ALPRAZOLAM 0.5 MG PO TABS
0.5000 mg | ORAL_TABLET | Freq: Three times a day (TID) | ORAL | Status: DC | PRN
  Administered 2019-09-07 – 2019-09-11 (×4): 0.5 mg via ORAL
  Filled 2019-09-07 (×4): qty 1

## 2019-09-07 MED ORDER — FUROSEMIDE 40 MG PO TABS
40.0000 mg | ORAL_TABLET | Freq: Once | ORAL | Status: AC
  Administered 2019-09-07: 40 mg via ORAL
  Filled 2019-09-07: qty 1

## 2019-09-07 MED ORDER — SODIUM CHLORIDE 0.9% FLUSH
3.0000 mL | Freq: Two times a day (BID) | INTRAVENOUS | Status: DC
  Administered 2019-09-07 – 2019-09-12 (×8): 3 mL via INTRAVENOUS

## 2019-09-07 MED ORDER — COLCHICINE 0.6 MG PO TABS
0.6000 mg | ORAL_TABLET | Freq: Every day | ORAL | Status: DC
  Administered 2019-09-08 – 2019-09-12 (×5): 0.6 mg via ORAL
  Filled 2019-09-07 (×6): qty 1

## 2019-09-07 MED ORDER — INSULIN ASPART 100 UNIT/ML ~~LOC~~ SOLN
0.0000 [IU] | Freq: Four times a day (QID) | SUBCUTANEOUS | Status: DC
  Administered 2019-09-07 – 2019-09-08 (×2): 1 [IU] via SUBCUTANEOUS
  Administered 2019-09-08: 2 [IU] via SUBCUTANEOUS
  Administered 2019-09-08: 5 [IU] via SUBCUTANEOUS
  Administered 2019-09-11: 7 [IU] via SUBCUTANEOUS
  Administered 2019-09-11: 3 [IU] via SUBCUTANEOUS
  Filled 2019-09-07 (×6): qty 1

## 2019-09-07 MED ORDER — MELATONIN 5 MG PO TABS
5.0000 mg | ORAL_TABLET | Freq: Every day | ORAL | Status: DC
  Administered 2019-09-08: 5 mg via ORAL
  Filled 2019-09-07 (×6): qty 1

## 2019-09-07 MED ORDER — SODIUM CHLORIDE 0.9 % IV SOLN: 250.0000 mL | INTRAVENOUS | Status: DC | PRN

## 2019-09-07 MED ORDER — ONDANSETRON HCL 4 MG/2ML IJ SOLN: 4.0000 mg | Freq: Four times a day (QID) | INTRAMUSCULAR | Status: DC | PRN

## 2019-09-07 MED ORDER — GUAIFENESIN-DM 100-10 MG/5ML PO SYRP
15.0000 mL | ORAL_SOLUTION | ORAL | Status: DC | PRN
  Administered 2019-09-08 – 2019-09-10 (×2): 15 mL via ORAL
  Filled 2019-09-07 (×2): qty 15

## 2019-09-07 MED ORDER — LORATADINE 10 MG PO TABS
10.0000 mg | ORAL_TABLET | Freq: Every day | ORAL | Status: DC
  Administered 2019-09-08 – 2019-09-12 (×5): 10 mg via ORAL
  Filled 2019-09-07 (×7): qty 1

## 2019-09-07 NOTE — ED Notes (Signed)
Upon entering room to assist pt to bed, pt was yelling at cussing at triage RN who was trying to assist her and said " do you want to fight". PT educated not to talk to staff that way. PT remains in recliner per her request, facing the door. Lights turned off and door cracked per pt request.

## 2019-09-07 NOTE — ED Notes (Signed)
Dinner at [employer].

## 2019-09-07 NOTE — ED Notes (Signed)
Pt continues to refuse blood work and IV at this time, states "I just want to sleep".

## 2019-09-07 NOTE — ED Notes (Signed)
Ptable to stand and provided with walker. Placed on comode to have a BM. This rn will continue to monitor pt.

## 2019-09-07 NOTE — ED Notes (Signed)
PT refusing to have repeat blood work or any further testing to be done.

## 2019-09-07 NOTE — ED Notes (Signed)
Pt sitting on recliner resting comfortably, oxygen in place [place] 2L. No further needs at this time.

## 2019-09-07 NOTE — ED Notes (Signed)
This RN attempted to call report, told by floor charge to cal back in 10 minutes.

## 2019-09-07 NOTE — Consult Note (Signed)
CARDIOLOGY CONSULT NOTE               Patient ID: Karla Sanchez MRN: [id number] DOB/AGE: 79/07/1940 79 y.o.  Admit date: 09/07/2019 Referring Physician Eugenie Norrie, MD Primary Physician Alfredia Client, MD  Primary Cardiologist none per patient Reason for Consultation acute on chronic diastolic CHF   HPI: 79 year old female referred for evaluation of acute on chronic diastolic CHF. She has a history of multiple recent ER visits and admissions for similar presentation, with most recent ER evaluation 08/15/2019. The patient has a history of insulin dependent type II diabetes, hypertension, and COPD on supplemental oxygen. The patient reports a recent history of recurrent progressive exertional dyspnea, as well as right lower extremity swelling and warmth without pain. She denies chest pain or palpitations. Previous lower extremity ultrasound in 06/2019 was negative for evidence of DVT. The patient received IV Lasix and DuoNebs in the ER. Admission labs notable for normal high sensitivity troponin, mildly elevated BNP of 171.6, hemoglobin 10.5, hematocrit 5.32, BUN 30, creatinine 1.12. ECG revealed sinus rhythm with RBBB with inferior T wave abnormalities. Chest xray revealed vascular congestion, mild central pulmonary artery prominence. 2D echocardiogram 07/11/2019 revealed normal left ventricular function with LVEF 55-60% with moderate LVH, diastolic dysfunction, mildly reduced right ventricular systolic function, biatrial enlargement, mild mitral regurgitation. The patient has been referred to the [facility] heart failure clinic, but appears to have missed the appointments. Currently, the patient denies chest pain, still feels short of breath with any degree of exertion, denies lower extremity pain, and wishes to go back to sleep. Of note, the patient reports that she has been staying at a local hotel as she searches for permanent housing, but has been too fatigue and short of breath to search  for housing, clean her house, and bring in groceries.   Review of systems complete and found to be negative unless listed above     Past Medical History:  Diagnosis Date  . Colon cancer [facility])    s/p right and left colectomy 2020  . COPD (chronic obstructive pulmonary disease) ([place])   . Diabetes mellitus without complication ([place])   . Diastolic heart failure ([place])   . Gout   . Hypertension     Past Surgical History:  Procedure Laterality Date  . COLON SURGERY    . TUBAL LIGATION  1970    (Not in a hospital admission)  Social History   Socioeconomic History  . Marital status: Widowed    Spouse name: Not on file  . Number of children: Not on file  . Years of education: Not on file  . Highest education level: Not on file  Occupational History  . Not on file  Tobacco Use  . Smoking status: Never Smoker  . Smokeless tobacco: Never Used  Substance and Sexual Activity  . Alcohol use: Not Currently  . Drug use: Never  . Sexual activity: Not Currently  Other Topics Concern  . Not on file  Social History Narrative  . Not on file   Social Determinants of Health   Financial Resource Strain:   . Difficulty of Paying Living Expenses:   Food Insecurity:   . Worried About Charity fundraiser in the Last Year:   . Arboriculturist in the Last Year:   Transportation Needs:   . Film/video editor (Medical):   Marland Kitchen Lack of Transportation (Non-Medical):   Physical Activity:   . Days of Exercise per Week:   . Minutes  of Exercise per Session:   Stress:   . Feeling of Stress :   Social Connections:   . Frequency of Communication with Friends and Family:   . Frequency of Social Gatherings with Friends and Family:   . Attends Religious Services:   . Active Member of Clubs or Organizations:   . Attends Archivist Meetings:   Marland Kitchen Marital Status:   Intimate Partner Violence:   . Fear of Current or Ex-Partner:   . Emotionally Abused:   Marland Kitchen Physically Abused:   . Sexually  Abused:     No family history on file.    Review of systems complete and found to be negative unless listed above      PHYSICAL EXAM  General: Elderly female, obese, sitting up in recliner, in no acute distress HEENT:  Normocephalic and atramatic Neck:  No JVD.  Lungs: diminished breath sounds throughout, basilar crackles, inspiratory wheezing Heart: HRRR . Normal S1 and S2 without gallops or murmurs.  Abdomen: nondistended Msk:  No obvious deformities Extremities: Trivial left lower extremity edema. Right lower extremity edema, erythema, and warmth present  Vasc: right dorsalis pedis pulse non-palpable Neuro: Alert and oriented X 3. Psych:  Good affect, responds appropriately  Labs:   Lab Results  Component Value Date   WBC 8.1 09/06/2019   HGB 10.5 (L) 09/06/2019   HCT 37.2 09/06/2019   MCV 69.9 (L) 09/06/2019   PLT 364 09/06/2019    Recent Labs  Lab 09/06/19 1840  NA 139  K 5.0  CL 103  CO2 28  BUN 30*  CREATININE 1.12*  CALCIUM 8.3*  GLUCOSE 104*   No results found for: CKTOTAL, CKMB, CKMBINDEX, TROPONINI No results found for: CHOL No results found for: HDL No results found for: LDLCALC No results found for: TRIG No results found for: CHOLHDL No results found for: LDLDIRECT    Radiology: DG Chest 1 View  Result Date: 09/06/2019 CLINICAL DATA:  Shortness of breath and right leg swelling EXAM: CHEST  1 VIEW COMPARISON:  Radiograph 08/14/2019 FINDINGS: Hazy interstitial opacities are present within the lungs with some peripheral septal thickening and mild central vascular congestion. No focal consolidation. No pneumothorax or visible effusion. Prominent central pulmonary arteries may reflect some pulmonary artery hypertension, similar to comparison is. The aorta is calcified. The remaining cardiomediastinal contours are unremarkable. No acute osseous or soft tissue abnormality. Degenerative changes are present in the imaged spine and shoulders. Nasal cannula  overlies the upper chest. IMPRESSION: Some hazy interstitial opacities with vascular congestion could reflect mild interstitial edema. Aortic Atherosclerosis (ICD10-I70.0). Mild central pulmonary artery prominence could reflect chronic pulmonary artery hypertension given presence on multiple comparisons. Electronically Signed   By: Lovena Le M.D.   On: 09/06/2019 19:10   DG Chest Portable 1 View  Result Date: 08/14/2019 CLINICAL DATA:  Shortness of breath EXAM: PORTABLE CHEST 1 VIEW COMPARISON:  07/30/2019 FINDINGS: The heart size and mediastinal contours are within normal limits. Both lungs are clear. The visualized skeletal structures are unremarkable. IMPRESSION: No active disease. Electronically Signed   By: Ulyses Jarred M.D.   On: 08/14/2019 23:34    EKG: sinus rhythm, RBBB, inferior T wave abnormalities  ASSESSMENT AND PLAN:  1. Acute on chronic diastolic CHF, with multiple ER visit and admissions for the same. Most recent echocardiogram in 06/2019 revealed normal LV function. Troponin normal. Patient has been referred to [facility] heart failure clinic, but has not gone to appointments. 2. Right lower extremity edema, erythema,  and warmth with previous doppler in 06/2019 negative for ultrasound. Repeat lower extremity ultrasound pending. Possible secondary to gout. 3. COPD on chronic oxygen therapy 4. Type II diabetes on insulin 5. Hypertension  Recommendations: 1. Continue IV Lasix with careful monitoring of I&Os and renal function 2. Defer repeating echocardiogram 3. Recommend social work involvement for assistance with housing vs SNF 4. Continue metoprolol tartrate   Signed: Clabe Seal PA-C 09/07/2019, 11:58 AM

## 2019-09-07 NOTE — ED Notes (Signed)
Pt allowed O2 to be placed back on. Pt on 2L [place].

## 2019-09-07 NOTE — ED Notes (Signed)
Dr. Beather Arbour informed this RN that pt refuses to be cared for by this RN. Care handed off to Jena, RN.

## 2019-09-07 NOTE — ED Notes (Signed)
Pt refusing IV and IV lasix, Dr. Beather Arbour made aware.

## 2019-09-07 NOTE — ED Provider Notes (Signed)
[facility] Emergency Department Provider Note   ____________________________________________   First MD Initiated Contact with Patient 09/07/19 0118     (approximate)  I have reviewed the triage vital signs and the nursing notes.   HISTORY  Chief Complaint Shortness of Breath and Leg Swelling    HPI Karla Sanchez is a 79 y.o. female brought to the ED via EMS from local hotel with a chief complaint of shortness of breath and right leg swelling.  Patient with a history of COPD, CHF on 3 L continuous oxygen who reports increased shortness of breath, chest tightness and dyspnea on exertion over the past several days.  Denies fever, cough, abdominal pain, nausea, vomiting or diarrhea.       Past Medical History:  Diagnosis Date  . Colon cancer [facility])    s/p right and left colectomy 2020  . COPD (chronic obstructive pulmonary disease) ([place])   . Diabetes mellitus without complication ([place])   . Diastolic heart failure ([place])   . Gout   . Hypertension     Patient Active Problem List   Diagnosis Date Noted  . COPD exacerbation ([place]) 07/09/2019  . Acute on chronic diastolic congestive heart failure ([place]) 07/09/2019  . DM (diabetes mellitus) ([place]) 07/09/2019  . Peripheral edema 07/09/2019  . Gout 07/09/2019  . Obesity, diabetes, and hypertension syndrome ([place]) 07/09/2019  . Anemia 07/09/2019    Past Surgical History:  Procedure Laterality Date  . COLON SURGERY    . TUBAL LIGATION  1970    Prior to Admission medications   Medication Sig Start Date End Date Taking? Authorizing Provider  albuterol (VENTOLIN HFA) 108 (90 Base) MCG/ACT inhaler Inhale 2 puffs into the lungs every 6 (six) hours as needed for wheezing or shortness of breath. 07/13/19   Lorella Nimrod, MD  allopurinol (ZYLOPRIM) 100 MG tablet Take 1 tablet (100 mg total) by mouth daily. 07/14/19   Lorella Nimrod, MD  colchicine 0.6 MG tablet Take 0.6 mg by mouth daily. 04/13/19   [provider]  furosemide (LASIX) 40 MG tablet Take 1 tablet (40 mg total) by mouth daily. 07/13/19 08/12/19  Lorella Nimrod, MD  insulin glargine (LANTUS) 100 UNIT/ML injection Inject 0.15 mLs (15 Units total) into the skin at bedtime. 07/13/19   Lorella Nimrod, MD  loratadine (CLARITIN) 10 MG tablet Take 1 tablet (10 mg total) by mouth daily. 08/03/19 10/02/19  Arta Silence, MD  metoprolol tartrate (LOPRESSOR) 50 MG tablet Take 1 tablet (50 mg total) by mouth 2 (two) times daily. 07/13/19   Lorella Nimrod, MD  SYRINGE-NEEDLE, DISP, 3 ML (LUER LOCK SAFETY SYRINGES) 25G X 5/8" 3 ML MISC 15 Units by Does not apply route at bedtime. 07/13/19   Lorella Nimrod, MD    Allergies Other, Exenatide, Levofloxacin, Losartan, Codeine, and Tetracyclines & related  No family history on file.  Social History Social History   Tobacco Use  . Smoking status: Never Smoker  . Smokeless tobacco: Never Used  Substance Use Topics  . Alcohol use: Not Currently  . Drug use: Never    Review of Systems  Constitutional: No fever/chills Eyes: No visual changes. ENT: No sore throat. Cardiovascular: Positive for chest tightness. Respiratory: Positive for shortness of breath. Gastrointestinal: No abdominal pain.  No nausea, no vomiting.  No diarrhea.  No constipation. Genitourinary: Negative for dysuria. Musculoskeletal: Negative for back pain. Skin: Negative for rash. Neurological: Negative for headaches, focal weakness or numbness.   ____________________________________________   PHYSICAL EXAM:  VITAL SIGNS: ED Triage Vitals  Enc Vitals Group     BP 09/06/19 1819 132/67     Pulse Rate 09/06/19 1819 72     Resp 09/06/19 1819 20     Temp 09/06/19 1819 98.2 F (36.8 C)     Temp src --      SpO2 09/06/19 1819 98 %     Weight 09/06/19 1820 220 lb (99.8 kg)     Height 09/06/19 1820 5\' 6"  (1.676 m)     Head Circumference --      Peak Flow --      Pain Score 09/06/19 1819 0     Pain Loc --       Pain Edu? --      Excl. in [place]? --     Constitutional: Alert and oriented.  Elderly appearing and in mild acute distress. Eyes: Conjunctivae are normal. PERRL. EOMI. Head: Atraumatic. Nose: No congestion/rhinnorhea. Mouth/Throat: Mucous membranes are moist.   Neck: No stridor.   Cardiovascular: Normal rate, regular rhythm. Grossly normal heart sounds.  Good peripheral circulation. Respiratory: Increased respiratory effort.  No retractions. Lungs with audible rales. Gastrointestinal: Obese.  Soft and nontender to light or deep palpation. No distention. No abdominal bruits. No CVA tenderness. Musculoskeletal: No lower extremity tenderness.  Right leg edema without warmth or erythema.  Palpable distal pulses.  No joint effusions. Neurologic:  Normal speech and language. No gross focal neurologic deficits are appreciated.  Skin:  Skin is warm, dry and intact. No rash noted. Psychiatric: Mood and affect are normal. Speech and behavior are normal.  ____________________________________________   LABS (all labs ordered are listed, but only abnormal results are displayed)  Labs Reviewed  BASIC METABOLIC PANEL - Abnormal; Notable for the following components:      Result Value   Glucose, Bld 104 (*)    BUN 30 (*)    Creatinine, Ser 1.12 (*)    Calcium 8.3 (*)    GFR calc non Af Amer 47 (*)    GFR calc Af Amer 54 (*)    All other components within normal limits  CBC - Abnormal; Notable for the following components:   RBC 5.32 (*)    Hemoglobin 10.5 (*)    MCV 69.9 (*)    MCH 19.7 (*)    MCHC 28.2 (*)    RDW 21.5 (*)    All other components within normal limits  BRAIN NATRIURETIC PEPTIDE - Abnormal; Notable for the following components:   B Natriuretic Peptide 171.6 (*)    All other components within normal limits  SARS CORONAVIRUS 2 BY RT PCR ([facility])  TROPONIN I (HIGH SENSITIVITY)    ____________________________________________  EKG  ED ECG REPORT I, Corianne Buccellato J, the attending physician, personally viewed and interpreted this ECG.   Date: 09/07/2019  EKG Time: 1824  Rate: 76  Rhythm: normal EKG, normal sinus rhythm  Axis: RAD  Intervals:right bundle branch block  ST&T Change: Nonspecific  ____________________________________________  RADIOLOGY  ED MD interpretation: Pulmonary vascular congestion  Official radiology report(s): DG Chest 1 View  Result Date: 09/06/2019 CLINICAL DATA:  Shortness of breath and right leg swelling EXAM: CHEST  1 VIEW COMPARISON:  Radiograph 08/14/2019 FINDINGS: Hazy interstitial opacities are present within the lungs with some peripheral septal thickening and mild central vascular congestion. No focal consolidation. No pneumothorax or visible effusion. Prominent central pulmonary arteries may reflect some pulmonary artery hypertension, similar to comparison is. The aorta  is calcified. The remaining cardiomediastinal contours are unremarkable. No acute osseous or soft tissue abnormality. Degenerative changes are present in the imaged spine and shoulders. Nasal cannula overlies the upper chest. IMPRESSION: Some hazy interstitial opacities with vascular congestion could reflect mild interstitial edema. Aortic Atherosclerosis (ICD10-I70.0). Mild central pulmonary artery prominence could reflect chronic pulmonary artery hypertension given presence on multiple comparisons. Electronically Signed   By: Lovena Le M.D.   On: 09/06/2019 19:10    ____________________________________________   PROCEDURES  Procedure(s) performed (including Critical Care):  .1-3 Lead EKG Interpretation Performed by: Paulette Blanch, MD Authorized by: Paulette Blanch, MD     Interpretation: normal     ECG rate:  75   ECG rate assessment: normal     Rhythm: sinus rhythm     Ectopy: none     Conduction: normal   Comments:     Patient placed on cardiac  monitor to monitor for arrhythmias     ____________________________________________   INITIAL IMPRESSION / ASSESSMENT AND PLAN / ED COURSE  As part of my medical decision making, I reviewed the following data within the [facility] notes reviewed and incorporated, Labs reviewed, EKG interpreted, Old chart reviewed, Radiograph reviewed, Discussed with admitting physician and Notes from prior ED visits     Karla Sanchez was evaluated in Emergency Department on 09/07/2019 for the symptoms described in the history of present illness. She was evaluated in the context of the global COVID-19 pandemic, which necessitated consideration that the patient might be at risk for infection with the SARS-CoV-2 virus that causes COVID-19. Institutional protocols and algorithms that pertain to the evaluation of patients at risk for COVID-19 are in a state of rapid change based on information released by regulatory bodies including the CDC and federal and state organizations. These policies and algorithms were followed during the patient's care in the ED.    79 year old female with COPD, CHF on continuous oxygen presenting with increased shortness of breath and right leg swelling. Differential includes, but is not limited to, viral syndrome, bronchitis including COPD exacerbation, pneumonia, reactive airway disease including asthma, CHF including exacerbation with or without pulmonary/interstitial edema, pneumothorax, ACS, thoracic trauma, and pulmonary embolism.  Laboratory results indicate mild AKI, elevated BNP.  Audible rales on examination and dyspnea with even the slightest exertion.  Will administer IV Lasix and discuss with hospitalist services for admission.   Clinical Course as of Sep 07 535  Thu Sep 07, 2019  0134 Patient refused to change from recliner to lay on the bed for RLE ultrasound.   [JS]  2952 Patient refuses IV.  Will administer oral Lasix.   [JS]    Clinical  Course User Index [JS] Paulette Blanch, MD     ____________________________________________   FINAL CLINICAL IMPRESSION(S) / ED DIAGNOSES  Final diagnoses:  Shortness of breath  Acute on chronic congestive heart failure, unspecified heart failure type [facility])     ED Discharge Orders    None       Note:  This document was prepared using Dragon voice recognition software and may include unintentional dictation errors.   Paulette Blanch, MD 09/07/19 [phone]

## 2019-09-07 NOTE — ED Notes (Addendum)
Pt still refuses blood work at this time.   Pt wet bedding. Pt changed to bedside recliner at this time. Pts bedding cleaned and new applied. Pt provided with water. When asked if this RN could apply her Bp cuff and pulse ox, pt refused.

## 2019-09-07 NOTE — ED Notes (Signed)
Pt able to stand and use walker to get on commode and back on chair. St SHOB on exertion. Pt placed safely on recliner at this time.

## 2019-09-07 NOTE — ED Notes (Signed)
OT at bedside. 

## 2019-09-07 NOTE — ED Notes (Signed)
Pt refuses blood lab draw at this time.

## 2019-09-07 NOTE — ED Notes (Signed)
Pt sitting on recliner comfortably at this time.

## 2019-09-07 NOTE — ED Notes (Signed)
US at bedside

## 2019-09-07 NOTE — ED Notes (Signed)
This Rn informing pt regarding medications due at this time. Pt st "not right now".

## 2019-09-07 NOTE — Progress Notes (Signed)
PROGRESS NOTE    Karla Sanchez  ZOX:[id number] DOB: 1940-06-21 DOA: 09/07/2019 PCP: Alfredia Client, MD   Assessment & Plan:   Active Problems:   Acute CHF (congestive heart failure) (HCC)   Acute on chronic diastolic CHF: continue on IV lasix, metoprolol. Monitor I/Os and daily weights. Cardio following and recs apprec  DM2: will continue to hold metformin. Continue on lantus, SSI w/ accuchecks   Gout: will continue allopurinol and colchicine.  Essential hypertension: will continue on metoprolol   Chronic RLE swelling: [place] is neg for DVT. Likely secondary to CHF   DVT prophylaxis: lovenox Code Status: DNR Family Communication:  Disposition Plan:  Likely d/c to SNF, depends on PT/OT recs    Consultants:   Cardio    Procedures:   Antimicrobials:    Subjective: Pt c/o RLE swelling   Objective: Vitals:   09/06/19 1820 09/07/19 0121 09/07/19 0306 09/07/19 1115  BP:  (!) 171/84 (!) 148/69 133/85  Pulse:  75 70 85  Resp:  (!) 22 20 18   Temp:      SpO2:  100% 95% 93%  Weight: 99.8 kg     Height: 5\' 6"  (1.676 m)      No intake or output data in the 24 hours ending 09/07/19 1457 Filed Weights   09/06/19 1820  Weight: 99.8 kg    Examination:  General exam: Appears calm and comfortable. Morbidly obese  Respiratory system: diminished breath sounds b/l Cardiovascular system: S1 & S2 +. No rubs, gallops or clicks. RLE edema  Gastrointestinal system: Abdomen is obese, soft and nontender.  Hypoactive bowel sounds heard. Central nervous system: Alert and oriented. Moves all 4 extremities  Psychiatry: Judgement and insight appear normal. Mood & affect appropriate.     Data Reviewed: I have personally reviewed following labs and imaging studies  CBC: Recent Labs  Lab 09/06/19 1840  WBC 8.1  HGB 10.5*  HCT 37.2  MCV 69.9*  PLT 811   Basic Metabolic Panel: Recent Labs  Lab 09/06/19 1840  NA 139  K 5.0  CL 103  CO2 28  GLUCOSE 104*    BUN 30*  CREATININE 1.12*  CALCIUM 8.3*   GFR: Estimated Creatinine Clearance: 48.5 mL/min (A) (by C-G formula based on SCr of 1.12 mg/dL (H)). Liver Function Tests: No results for input(s): AST, ALT, ALKPHOS, BILITOT, PROT, ALBUMIN in the last 168 hours. No results for input(s): LIPASE, AMYLASE in the last 168 hours. No results for input(s): AMMONIA in the last 168 hours. Coagulation Profile: No results for input(s): INR, PROTIME in the last 168 hours. Cardiac Enzymes: No results for input(s): CKTOTAL, CKMB, CKMBINDEX, TROPONINI in the last 168 hours. BNP (last 3 results) No results for input(s): PROBNP in the last 8760 hours. HbA1C: No results for input(s): HGBA1C in the last 72 hours. CBG: Recent Labs  Lab 09/07/19 1030  GLUCAP 161*   Lipid Profile: No results for input(s): CHOL, HDL, LDLCALC, TRIG, CHOLHDL, LDLDIRECT in the last 72 hours. Thyroid Function Tests: No results for input(s): TSH, T4TOTAL, FREET4, T3FREE, THYROIDAB in the last 72 hours. Anemia Panel: No results for input(s): VITAMINB12, FOLATE, FERRITIN, TIBC, IRON, RETICCTPCT in the last 72 hours. Sepsis Labs: No results for input(s): PROCALCITON, LATICACIDVEN in the last 168 hours.  Recent Results (from the past 240 hour(s))  SARS Coronavirus 2 by RT PCR (hospital order, performed in [facility] hospital lab) Nasopharyngeal Nasopharyngeal Swab     Status: None   Collection Time: 09/07/19  2:47 AM  Specimen: Nasopharyngeal Swab  Result Value Ref Range Status   SARS Coronavirus 2 NEGATIVE NEGATIVE Final    Comment: (NOTE) SARS-CoV-2 target nucleic acids are NOT DETECTED.  The SARS-CoV-2 RNA is generally detectable in upper and lower respiratory specimens during the acute phase of infection. The lowest concentration of SARS-CoV-2 viral copies this assay can detect is 250 copies / mL. A negative result does not preclude SARS-CoV-2 infection and should not be used as the sole basis for treatment or  other patient management decisions.  A negative result may occur with improper specimen collection / handling, submission of specimen other than nasopharyngeal swab, presence of viral mutation(s) within the areas targeted by this assay, and inadequate number of viral copies (<250 copies / mL). A negative result must be combined with clinical observations, patient history, and epidemiological information.  Fact Sheet for Patients:   StrictlyIdeas.no  Fact Sheet for Healthcare Providers: BankingDealers.co.za  This test is not yet approved or  cleared by the [place] FDA and has been authorized for detection and/or diagnosis of SARS-CoV-2 by FDA under an Emergency Use Authorization (EUA).  This EUA will remain in effect (meaning this test can be used) for the duration of the COVID-19 declaration under Section 564(b)(1) of the Act, 21 U.S.C. section 360bbb-3(b)(1), unless the authorization is terminated or revoked sooner.  Performed at [facility], [address] [place], [place] [address]          Radiology Studies: DG Chest 1 View  Result Date: 09/06/2019 CLINICAL DATA:  Shortness of breath and right leg swelling EXAM: CHEST  1 VIEW COMPARISON:  Radiograph 08/14/2019 FINDINGS: Hazy interstitial opacities are present within the lungs with some peripheral septal thickening and mild central vascular congestion. No focal consolidation. No pneumothorax or visible effusion. Prominent central pulmonary arteries may reflect some pulmonary artery hypertension, similar to comparison is. The aorta is calcified. The remaining cardiomediastinal contours are unremarkable. No acute osseous or soft tissue abnormality. Degenerative changes are present in the imaged spine and shoulders. Nasal cannula overlies the upper chest. IMPRESSION: Some hazy interstitial opacities with vascular congestion could reflect mild interstitial edema. Aortic  Atherosclerosis (ICD10-I70.0). Mild central pulmonary artery prominence could reflect chronic pulmonary artery hypertension given presence on multiple comparisons. Electronically Signed   By: Lovena Le M.D.   On: 09/06/2019 19:10   US Venous Img Lower Unilateral Right (DVT)  Result Date: 09/07/2019 CLINICAL DATA:  Lower extremity swelling. EXAM: RIGHT LOWER EXTREMITY VENOUS DOPPLER ULTRASOUND TECHNIQUE: Gray-scale sonography with compression, as well as color and duplex ultrasound, were performed to evaluate the deep venous system(s) from the level of the common femoral vein through the popliteal and proximal calf veins. COMPARISON:  07/09/2019. FINDINGS: VENOUS Normal compressibility of the common femoral, superficial femoral, and popliteal veins, as well as the visualized calf veins. Limited visualization of the peroneal veins. Visualized portions of profunda femoral vein and great saphenous vein unremarkable. No filling defects to suggest DVT on grayscale or color Doppler imaging. Doppler waveforms show normal direction of venous flow, normal respiratory plasticity and response to augmentation. Limited views of the contralateral common femoral vein are unremarkable. OTHER None. IMPRESSION: Negative exam.  No evidence of right lower extremity DVT. Electronically Signed   By: [place]   On: 09/07/2019 12:14        Scheduled Meds: . allopurinol  100 mg Oral Daily  . colchicine  0.6 mg Oral Daily  . enoxaparin (LOVENOX) injection  40 mg Subcutaneous Q24H  .  furosemide  40 mg Intravenous Q12H  . insulin aspart  0-9 Units Subcutaneous QID  . insulin glargine  15 Units Subcutaneous QHS  . loratadine  10 mg Oral Daily  . metoprolol tartrate  50 mg Oral BID  . sodium chloride flush  3 mL Intravenous Q12H   Continuous Infusions: . sodium chloride       LOS: 0 days    Time spent: 35 mins     Wyvonnia Dusky, MD [facility] Pager [phone]  If 7PM-7AM, please  contact night-coverage [email or web address] 09/07/2019, 2:57 PM

## 2019-09-07 NOTE — ED Notes (Signed)
Dr. Sidney Ace in to admit

## 2019-09-07 NOTE — ED Notes (Signed)
Pt in lobby sitting in recliner, sleeping soundly; resp even/unlab

## 2019-09-07 NOTE — H&P (Signed)
[place] [facility] [place] NAME: Karla Sanchez    MR#:  [id number]  DATE OF BIRTH:  1940-05-26  DATE OF ADMISSION:  09/07/2019  PRIMARY CARE PHYSICIAN: Alfredia Client, MD   REQUESTING/REFERRING PHYSICIAN: Lurline Hare, MD  CHIEF COMPLAINT:   Chief Complaint  Patient presents with  . Shortness of Breath  . Leg Swelling    HISTORY OF PRESENT ILLNESS:  Karla Sanchez  is a 79 y.o. female with a known history of diastolic heart failure, COPD, type 2 diabetes mellitus and hypertension, presented to the emergency room with worsening dyspnea for the last several days without significant cough or wheezing.  She has been having dyspnea on exertion and worsening lower extremity edema.  She denied any fever or chills.  No nausea or vomiting or abdominal pain.  She denied chest pain or palpitations.  Upon presentation to the emergency room vital signs were normal pulse 70 of 98% on 3 L of O2 by nasal cannula.  The patient is usually on 2 L of O2 at home.  Blood pressure later on was 171/84 respiratory to 22.  Labs revealed BNP was 171.6 and troponin I was 13.  CBC showed anemia better than previous levels..  EKG showed normal sinus rhythm with a rate of 76 with right bundle branch block and T wave inversion inferiorly.  For which x-ray showed mild interstitial pulmonary edema and pulmonary arterial hypertension as well as atherosclerosis.  Most recent 2D echo showed an EF of 55 to 60% with LVH, grade 2 diastolic dysfunction moderate LAD and mild to moderate RAD with mild mitral regurgitation and dilated IVC.  The patient was given 40 mg of IV Lasix and DuoNeb's.  She will be admitted to an observation progressive cardiac bed for further evaluation and management. PAST MEDICAL HISTORY:   Past Medical History:  Diagnosis Date  . Colon cancer [facility])    s/p right and left colectomy 2020  . COPD (chronic obstructive pulmonary disease) ([place])   . Diabetes mellitus  without complication ([place])   . Diastolic heart failure ([place])   . Gout   . Hypertension     PAST SURGICAL HISTORY:   Past Surgical History:  Procedure Laterality Date  . COLON SURGERY    . TUBAL LIGATION  1970    SOCIAL HISTORY:   Social History   Tobacco Use  . Smoking status: Never Smoker  . Smokeless tobacco: Never Used  Substance Use Topics  . Alcohol use: Not Currently    FAMILY HISTORY:  She did not recall any pertinent familial diseases in her family.  DRUG ALLERGIES:   Allergies  Allergen Reactions  . Other Other (See Comments)    Other reaction(s): SWELLING  Other reaction(s): MUSCLE PAIN Muscle aches Other reaction(s): MUSCLE PAIN Muscle aches Other reaction(s): MUSCLE PAIN Muscle aches   . Exenatide Nausea And Vomiting  . Levofloxacin Other (See Comments)    Other Reaction: sloughing of buccal mucosa Other reaction(s): OTHER Lost skin in mouth Patient states she can take Cipro without difficulty and has taken it many times in the past Other Reaction: sloughing of buccal mucosa   . Losartan Other (See Comments)    Weight gain Weight gain   . Codeine Nausea Only  . Tetracyclines & Related Other (See Comments)    REVIEW OF SYSTEMS:   ROS As per history of present illness. All pertinent systems were reviewed above. Constitutional,  HEENT, cardiovascular, respiratory, GI, GU, musculoskeletal, neuro, psychiatric,  endocrine,  integumentary and hematologic systems were reviewed and are otherwise  negative/unremarkable except for positive findings mentioned above in the HPI.   MEDICATIONS AT HOME:   Prior to Admission medications   Medication Sig Start Date End Date Taking? Authorizing Provider  albuterol (VENTOLIN HFA) 108 (90 Base) MCG/ACT inhaler Inhale 2 puffs into the lungs every 6 (six) hours as needed for wheezing or shortness of breath. 07/13/19   Lorella Nimrod, MD  allopurinol (ZYLOPRIM) 100 MG tablet Take 1 tablet (100 mg total) by  mouth daily. 07/14/19   Lorella Nimrod, MD  colchicine 0.6 MG tablet Take 0.6 mg by mouth daily. 04/13/19   [provider]  furosemide (LASIX) 40 MG tablet Take 1 tablet (40 mg total) by mouth daily. 07/13/19 08/12/19  Lorella Nimrod, MD  insulin glargine (LANTUS) 100 UNIT/ML injection Inject 0.15 mLs (15 Units total) into the skin at bedtime. 07/13/19   Lorella Nimrod, MD  loratadine (CLARITIN) 10 MG tablet Take 1 tablet (10 mg total) by mouth daily. 08/03/19 10/02/19  Arta Silence, MD  metoprolol tartrate (LOPRESSOR) 50 MG tablet Take 1 tablet (50 mg total) by mouth 2 (two) times daily. 07/13/19   Lorella Nimrod, MD  SYRINGE-NEEDLE, DISP, 3 ML (LUER LOCK SAFETY SYRINGES) 25G X 5/8" 3 ML MISC 15 Units by Does not apply route at bedtime. 07/13/19   Lorella Nimrod, MD      VITAL SIGNS:  Blood pressure (!) 171/84, pulse 75, temperature 98.2 F (36.8 C), resp. rate (!) 22, height 5\' 6"  (1.676 m), weight 99.8 kg, SpO2 100 %.  PHYSICAL EXAMINATION:  Physical Exam  GENERAL:  79 y.o.-year-old Caucasian female patient lying in the bed with mild respiratory distress with conversational dyspnea. EYES: Pupils equal, round, reactive to light and accommodation. No scleral icterus. Extraocular muscles intact.  HEENT: Head atraumatic, normocephalic. Oropharynx and nasopharynx clear.  NECK:  Supple, no jugular venous distention. No thyroid enlargement, no tenderness.  LUNGS: Diminished bibasal breath sounds with bibasal rales.   CARDIOVASCULAR: Regular rate and rhythm, S1, S2 normal. No murmurs, rubs, or gallops.  ABDOMEN: Soft, nondistended, nontender. Bowel sounds present. No organomegaly or mass.  EXTREMITIES: 2+ lower extremity pitting edema right than the left, with no cyanosis, or clubbing.  NEUROLOGIC: Cranial nerves II through XII are intact. Muscle strength 5/5 in all extremities. Sensation intact. Gait not checked.  PSYCHIATRIC: The patient is alert and oriented x 3.  Normal affect and good  eye contact. SKIN: No obvious rash, lesion, or ulcer.   LABORATORY PANEL:   CBC Recent Labs  Lab 09/06/19 1840  WBC 8.1  HGB 10.5*  HCT 37.2  PLT 364   ------------------------------------------------------------------------------------------------------------------  Chemistries  Recent Labs  Lab 09/06/19 1840  NA 139  K 5.0  CL 103  CO2 28  GLUCOSE 104*  BUN 30*  CREATININE 1.12*  CALCIUM 8.3*   ------------------------------------------------------------------------------------------------------------------  Cardiac Enzymes No results for input(s): TROPONINI in the last 168 hours. ------------------------------------------------------------------------------------------------------------------  RADIOLOGY:  DG Chest 1 View  Result Date: 09/06/2019 CLINICAL DATA:  Shortness of breath and right leg swelling EXAM: CHEST  1 VIEW COMPARISON:  Radiograph 08/14/2019 FINDINGS: Hazy interstitial opacities are present within the lungs with some peripheral septal thickening and mild central vascular congestion. No focal consolidation. No pneumothorax or visible effusion. Prominent central pulmonary arteries may reflect some pulmonary artery hypertension, similar to comparison is. The aorta is calcified. The remaining cardiomediastinal contours are unremarkable. No acute osseous or soft tissue abnormality. Degenerative changes are present  in the imaged spine and shoulders. Nasal cannula overlies the upper chest. IMPRESSION: Some hazy interstitial opacities with vascular congestion could reflect mild interstitial edema. Aortic Atherosclerosis (ICD10-I70.0). Mild central pulmonary artery prominence could reflect chronic pulmonary artery hypertension given presence on multiple comparisons. Electronically Signed   By: Lovena Le M.D.   On: 09/06/2019 19:10      IMPRESSION AND PLAN:   1.  Acute on chronic diastolic CHF with EF of 55 to 60%. -The patient will be admitted to a  progressive unit bed. -She will be diuresed with IV Lasix.  So far the patient is refusing IV though. -We will follow I's and O's. -We will not repeat 2D echo given recent one on 07/11/2019. -Cardiology consultation will be obtained. -I notified Dr. Saralyn Pilar about the patient.  2. Type 2 diabetes mellitus. -The patient will be placed on supplement coverage with NovoLog one hold off Metformin.  3.  Gout  -We will continue allopurinol and colchicine.  4. Essential hypertension. -We will continue Lopressor.  5.  DVT prophylaxis. -Subcutaneous Lovenox   All the records are reviewed and case discussed with ED provider. The plan of care was discussed in details with the patient (and family). I answered all questions. The patient agreed to proceed with the above mentioned plan. Further management will depend upon hospital course.   CODE STATUS: This was discussed with the patient and she desires to have DNR/DNI status.  Status is: Observation  The patient remains OBS appropriate and will d/c before 2 midnights.  Dispo: The patient is from: Home              Anticipated d/c is to: Home              Anticipated d/c date is: 1 day              Patient currently is not medically stable to d/c.   TOTAL TIME TAKING CARE OF THIS PATIENT: 50 minutes.    Christel Mormon M.D on 09/07/2019 at 2:56 AM  [facility]   From 7 PM-7 AM, contact night-coverage [email or web address]  CC: Primary care physician; Alfredia Client, MD   Note: This dictation was prepared with Dragon dictation along with smaller phrase technology. Any transcriptional typo errors that result from this process are unintentional.

## 2019-09-07 NOTE — ED Notes (Signed)
Pt refuses [place] at this time. Dr. Beather Arbour made aware.

## 2019-09-07 NOTE — ED Notes (Signed)
Pt sitting in recliner in lobby, refuses to wear face mask when instructed, pt is cursing and yelling

## 2019-09-07 NOTE — ED Notes (Signed)
Pt refusing medication at this time. St "come back later". This RN will continue to monitor pt.

## 2019-09-07 NOTE — ED Notes (Signed)
CBG monitor reads 161

## 2019-09-07 NOTE — ED Notes (Signed)
Pt to room 16 via recliner; pt st that she is not ambulatory; pt is refusing nurses assistance to transfer to bed, pt cont to curse and yell--st "you wanna fight, I'll give you a fight!"

## 2019-09-07 NOTE — Progress Notes (Signed)
The patient arrived to the floor and refused the standard hospital bed. Demanding to be left in the ED stretcher unless she had a bariatric bed. Camera operator and [facility] notified. The patient became verbally abusive yelling get out of her room. Bariatric bed ordered.

## 2019-09-07 NOTE — Evaluation (Signed)
Occupational Therapy Evaluation Patient Details Name: Karla Sanchez MRN: [id number] DOB: Nov 03, 1940 Today's Date: 09/07/2019    History of Present Illness Karla Sanchez  is a 79 y.o. female with a known history of diastolic heart failure, COPD, type 2 diabetes mellitus and hypertension, presented to the emergency room with worsening dyspnea for the last several days without significant cough or wheezing.  She has been having dyspnea on exertion and worsening lower extremity edema. Pt uses a WC at baseline. Performs 2-3 step MOD I transfers to/from [facility] and propels with feet.   Clinical Impression   [name] was seen for OT evaluation this date. Prior to hospital admission, pt was modified independent with ADL management. She reports performing brief transfers (2-3 steps) to/from her manual WC, which she propels with her feet. Pt lives in a hotel and has a nephew who assists her PRN. Pt is alert and oriented to all information; follows commands consistent during session, though requires frequent redirection to task/topic at hand. Generally hyperverbal and self-directive of care. BLE/BUE generally weak. Currently pt demonstrates impairments as described below (See OT problem list) which functionally limit her ability to perform ADL/self-care tasks. Pt currently requires MIN A for LB ADL management and CGA for functional mobility. Pt would benefit from skilled OT to address noted impairments and functional limitations (see below for any additional details) in order to maximize safety and independence while minimizing falls risk and caregiver burden. Upon hospital discharge, recommend HHOT to maximize pt safety and return to functional independence during meaningful occupations of daily life.    Follow Up Recommendations  Home health OT    Equipment Recommendations  3 in 1 bedside commode    Recommendations for Other Services       Precautions / Restrictions Precautions Precautions:  Fall Restrictions Weight Bearing Restrictions: No      Mobility Bed Mobility               General bed mobility comments: Deferred. Pt in recliner at start/end of session  Transfers Overall transfer level: Needs assistance   Transfers: Sit to/from Stand Sit to Stand: Min guard;Supervision         General transfer comment: Min guard for STS to/from recliner x2. Pt has difficulty following VCs for safe transfer technique. Is noted to rock forward and use momentum to bring herself up and forward.    Balance Overall balance assessment: Needs assistance Sitting-balance support: No upper extremity supported;Feet supported Sitting balance-Leahy Scale: Normal Sitting balance - Comments: Steady static sitting, reaching outside BOS   Standing balance support: During functional activity;No upper extremity supported Standing balance-Leahy Scale: Good Standing balance comment: Steady static standing w/o UE support during functional task                           ADL either performed or assessed with clinical judgement   ADL Overall ADL's : Needs assistance/impaired                                       General ADL Comments: MIN A for LBD 2/2 increased edema and generalized weakness. Pt able to stand from recliner to perform chuck change and peri care as pt found to be we with urine upon therapist arrival. Pt appears unaware.     Vision Patient Visual Report: No change from baseline  Perception     Praxis      Pertinent Vitals/Pain Faces Pain Scale: Hurts a little bit Pain Location: RLE Pain Descriptors / Indicators: Guarding;Grimacing;Sore Pain Intervention(s): Limited activity within patient's tolerance;Monitored during session;Repositioned     Hand Dominance Right   Extremity/Trunk Assessment Upper Extremity Assessment Upper Extremity Assessment: Generalized weakness   Lower Extremity Assessment Lower Extremity Assessment:  Generalized weakness       Communication Communication Communication: No difficulties   Cognition Arousal/Alertness: Awake/alert Behavior During Therapy: WFL for tasks assessed/performed Overall Cognitive Status: Within Functional Limits for tasks assessed                                 General Comments: Hyperverbal; requires consistent re-direction to task/topic at hand.   General Comments       Exercises Other Exercises Other Exercises: Pt educated on falls prevention strategies, safe transfer techniques, and edema management strategies including positional strategies to minimize fluid retention in BLE this date. Other Exercises: OT facilitates chair linen change and assists pt with standing peri-care and positions pt with feet elevated to maximize edema management and skin integrity this date.   Shoulder Instructions      Home Living Family/patient expects to be discharged to:: Other (Comment)                                 Additional Comments: Living at hotel but wants to go to retirement facility/ALF      Prior Functioning/Environment Level of Independence: Needs assistance  Gait / Transfers Assistance Needed: Uses heels of B feet to propel self in manual w/c (doesn't use hands to propel w/c d/t h/o hand issues); has power w/c but is in the "shop" and prefers to use manual w/c. 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 and can only take 2 steps for transfers; does not walk d/t this and does not stand for any length of time d/t this--pt reports it will "crush" the bones in her feet)        OT Problem List: Decreased strength;Cardiopulmonary status limiting activity;Decreased activity  tolerance;Increased edema;Impaired balance (sitting and/or standing);Decreased knowledge of use of DME or AE      OT Treatment/Interventions: Self-care/ADL training;Therapeutic exercise;Therapeutic activities;Manual therapy;DME and/or AE instruction;Patient/family education;Modalities;Energy conservation    OT Goals(Current goals can be found in the care plan section) Acute Rehab OT Goals Patient Stated Goal: For my legs to be less swollen OT Goal Formulation: With patient Time For Goal Achievement: 09/21/19 Potential to Achieve Goals: Good ADL Goals Pt Will Perform Lower Body Dressing: with modified independence;sit to/from stand;with adaptive equipment (c LRAD PRN for improved safety and fxl independence upon hospital DC.) Pt Will Transfer to Toilet: bedside commode;stand pivot transfer;with modified independence (c LRAD PRN for improved safety and fxl independence upon hospital DC.) Pt Will Perform Toileting - Clothing Manipulation and hygiene: sitting/lateral leans;with modified independence (c LRAD PRN for improved safety and fxl independence upon hospital DC.) Additional ADL Goal #1: Pt will independently verbalize a plan to implement at least 3 learned edema management strategies into her daily routines upon hospital DC.  OT Frequency: Min 1X/week  Barriers to D/C: Decreased caregiver support          Co-evaluation              AM-PAC OT "6 Clicks" Daily Activity     Outcome Measure Help from another person eating meals?: None Help from another person taking care of personal grooming?: A Little Help from another person toileting, which includes using toliet, bedpan, or urinal?: A Little Help from another person bathing (including washing, rinsing, drying)?: A Little Help from another person to put on and taking off regular upper body clothing?: None Help from another person to put on and taking off regular lower body clothing?: A Little 6 Click Score: 20   End of  Session Equipment Utilized During Treatment: Gait belt (BSC) Nurse Communication: Other (comment) (Pt re-positioned with feet elevated. requesting coffee)  Activity Tolerance: Patient tolerated treatment well Patient left: in chair;with call bell/phone within reach (Pt recieved in ED, no chair alarm.)  OT Visit Diagnosis: Other abnormalities of gait and mobility (R26.89)                Time: 6767-2094 OT Time Calculation (min): 29 min Charges:  OT General Charges $OT Visit: 1 Visit OT Evaluation $OT Eval Moderate Complexity: 1 Mod OT Treatments $Self Care/Home Management : 23-37 mins  Shara Blazing, M.S., OTR/L Ascom: [phone] 09/07/19, 1:08 PM

## 2019-09-08 DIAGNOSIS — J9611 Chronic respiratory failure with hypoxia: Secondary | ICD-10-CM | POA: Diagnosis present

## 2019-09-08 DIAGNOSIS — E1169 Type 2 diabetes mellitus with other specified complication: Secondary | ICD-10-CM | POA: Diagnosis not present

## 2019-09-08 DIAGNOSIS — Z9119 Patient's noncompliance with other medical treatment and regimen: Secondary | ICD-10-CM | POA: Diagnosis not present

## 2019-09-08 DIAGNOSIS — I509 Heart failure, unspecified: Secondary | ICD-10-CM

## 2019-09-08 DIAGNOSIS — I4510 Unspecified right bundle-branch block: Secondary | ICD-10-CM | POA: Diagnosis present

## 2019-09-08 DIAGNOSIS — Z794 Long term (current) use of insulin: Secondary | ICD-10-CM | POA: Diagnosis not present

## 2019-09-08 DIAGNOSIS — Z885 Allergy status to narcotic agent status: Secondary | ICD-10-CM | POA: Diagnosis not present

## 2019-09-08 DIAGNOSIS — Z66 Do not resuscitate: Secondary | ICD-10-CM | POA: Diagnosis present

## 2019-09-08 DIAGNOSIS — E875 Hyperkalemia: Secondary | ICD-10-CM | POA: Diagnosis present

## 2019-09-08 DIAGNOSIS — I5031 Acute diastolic (congestive) heart failure: Secondary | ICD-10-CM | POA: Diagnosis not present

## 2019-09-08 DIAGNOSIS — E119 Type 2 diabetes mellitus without complications: Secondary | ICD-10-CM | POA: Diagnosis present

## 2019-09-08 DIAGNOSIS — T501X5A Adverse effect of loop [high-ceiling] diuretics, initial encounter: Secondary | ICD-10-CM | POA: Diagnosis not present

## 2019-09-08 DIAGNOSIS — Z9114 Patient's other noncompliance with medication regimen: Secondary | ICD-10-CM | POA: Diagnosis not present

## 2019-09-08 DIAGNOSIS — M109 Gout, unspecified: Secondary | ICD-10-CM | POA: Diagnosis present

## 2019-09-08 DIAGNOSIS — I2721 Secondary pulmonary arterial hypertension: Secondary | ICD-10-CM | POA: Diagnosis present

## 2019-09-08 DIAGNOSIS — R0602 Shortness of breath: Secondary | ICD-10-CM | POA: Diagnosis present

## 2019-09-08 DIAGNOSIS — Z5320 Procedure and treatment not carried out because of patient's decision for unspecified reasons: Secondary | ICD-10-CM | POA: Diagnosis present

## 2019-09-08 DIAGNOSIS — J441 Chronic obstructive pulmonary disease with (acute) exacerbation: Secondary | ICD-10-CM | POA: Diagnosis present

## 2019-09-08 DIAGNOSIS — N179 Acute kidney failure, unspecified: Secondary | ICD-10-CM | POA: Diagnosis present

## 2019-09-08 DIAGNOSIS — R7989 Other specified abnormal findings of blood chemistry: Secondary | ICD-10-CM | POA: Diagnosis not present

## 2019-09-08 DIAGNOSIS — Z20822 Contact with and (suspected) exposure to covid-19: Secondary | ICD-10-CM | POA: Diagnosis present

## 2019-09-08 DIAGNOSIS — I110 Hypertensive heart disease with heart failure: Secondary | ICD-10-CM | POA: Diagnosis present

## 2019-09-08 DIAGNOSIS — Z79899 Other long term (current) drug therapy: Secondary | ICD-10-CM | POA: Diagnosis not present

## 2019-09-08 DIAGNOSIS — Z6839 Body mass index (BMI) 39.0-39.9, adult: Secondary | ICD-10-CM | POA: Diagnosis not present

## 2019-09-08 DIAGNOSIS — Z85038 Personal history of other malignant neoplasm of large intestine: Secondary | ICD-10-CM | POA: Diagnosis not present

## 2019-09-08 DIAGNOSIS — Y92239 Unspecified place in hospital as the place of occurrence of the external cause: Secondary | ICD-10-CM | POA: Diagnosis not present

## 2019-09-08 DIAGNOSIS — Z888 Allergy status to other drugs, medicaments and biological substances status: Secondary | ICD-10-CM | POA: Diagnosis not present

## 2019-09-08 DIAGNOSIS — I5033 Acute on chronic diastolic (congestive) heart failure: Secondary | ICD-10-CM | POA: Diagnosis present

## 2019-09-08 LAB — CBC
HCT: 36.4 % (ref 36.0–46.0)
Hemoglobin: 10.4 g/dL — ABNORMAL LOW (ref 12.0–15.0)
MCH: 19.7 pg — ABNORMAL LOW (ref 26.0–34.0)
MCHC: 28.6 g/dL — ABNORMAL LOW (ref 30.0–36.0)
MCV: 69.1 fL — ABNORMAL LOW (ref 80.0–100.0)
Platelets: 362 10*3/uL (ref 150–400)
RBC: 5.27 MIL/uL — ABNORMAL HIGH (ref 3.87–5.11)
RDW: 21.5 % — ABNORMAL HIGH (ref 11.5–15.5)
WBC: 7.7 10*3/uL (ref 4.0–10.5)
nRBC: 0 % (ref 0.0–0.2)

## 2019-09-08 LAB — GLUCOSE, CAPILLARY
Glucose-Capillary: 100 mg/dL — ABNORMAL HIGH (ref 70–99)
Glucose-Capillary: 143 mg/dL — ABNORMAL HIGH (ref 70–99)
Glucose-Capillary: 185 mg/dL — ABNORMAL HIGH (ref 70–99)
Glucose-Capillary: 291 mg/dL — ABNORMAL HIGH (ref 70–99)

## 2019-09-08 LAB — BASIC METABOLIC PANEL
Anion gap: 10 (ref 5–15)
BUN: 34 mg/dL — ABNORMAL HIGH (ref 8–23)
CO2: 29 mmol/L (ref 22–32)
Calcium: 8.4 mg/dL — ABNORMAL LOW (ref 8.9–10.3)
Chloride: 102 mmol/L (ref 98–111)
Creatinine, Ser: 1.25 mg/dL — ABNORMAL HIGH (ref 0.44–1.00)
GFR calc Af Amer: 47 mL/min — ABNORMAL LOW (ref >60)
GFR calc non Af Amer: 41 mL/min — ABNORMAL LOW (ref >60)
Glucose, Bld: 96 mg/dL (ref 70–99)
Potassium: 5 mmol/L (ref 3.5–5.1)
Sodium: 141 mmol/L (ref 135–145)

## 2019-09-08 MED ORDER — IPRATROPIUM-ALBUTEROL 0.5-2.5 (3) MG/3ML IN SOLN
3.0000 mL | Freq: Three times a day (TID) | RESPIRATORY_TRACT | Status: DC
  Administered 2019-09-08: 3 mL via RESPIRATORY_TRACT
  Filled 2019-09-08 (×3): qty 3

## 2019-09-08 MED ORDER — METHYLPREDNISOLONE SODIUM SUCC 40 MG IJ SOLR
40.0000 mg | Freq: Two times a day (BID) | INTRAMUSCULAR | Status: DC
  Administered 2019-09-08 – 2019-09-12 (×5): 40 mg via INTRAVENOUS
  Filled 2019-09-08 (×8): qty 1

## 2019-09-08 MED ORDER — IPRATROPIUM-ALBUTEROL 0.5-2.5 (3) MG/3ML IN SOLN
3.0000 mL | Freq: Four times a day (QID) | RESPIRATORY_TRACT | Status: DC
  Administered 2019-09-08: 3 mL via RESPIRATORY_TRACT
  Filled 2019-09-08: qty 3

## 2019-09-08 NOTE — Progress Notes (Signed)
PROGRESS NOTE    Karla Sanchez  DXI:[id number] DOB: 1941/03/12 DOA: 09/07/2019 PCP: Alfredia Client, MD   Assessment & Plan:   Active Problems:   Acute CHF (congestive heart failure) (HCC)   Acute exacerbation of CHF (congestive heart failure) (HCC)   Acute on chronic diastolic CHF: continue on IV lasix, metoprolol. Monitor I/Os and daily weights. Cardio following and recs apprec  COPD exacerbation: started on duonebs, IV steroids. Encourage incentive spirometry   Chronic hypoxic respiratory failure: continue on supplemental oxygen. Likely secondary to COPD  DM2: will continue to hold metformin. Continue on lantus, SSI w/ accuchecks   Gout: will continue allopurinol and colchicine.  Essential hypertension: will continue on metoprolol   Chronic RLE swelling: [place] is neg for DVT. Likely secondary to CHF   Generalized weakness: PT/OT consulted  DVT prophylaxis: lovenox Code Status: DNR Family Communication:  Disposition Plan:  Likely d/c to SNF, depends on PT/OT recs    Consultants:   Cardio    Procedures:   Antimicrobials:    Subjective: Pt c/o shortness of breath    Objective: Vitals:   09/07/19 2208 09/07/19 2241 09/08/19 0732 09/08/19 1228  BP: (!) 168/66  (!) 144/57 (!) 136/43  Pulse: (!) 58 61 (!) 58 (!) 55  Resp: 20  20 19   Temp: 98.3 F (36.8 C)  97.9 F (36.6 C) 98.4 F (36.9 C)  TempSrc:   Oral   SpO2: 96%  97% 96%  Weight: 111.9 kg     Height: 5\' 6"  (1.676 m)       Intake/Output Summary (Last 24 hours) at 09/08/2019 1348 Last data filed at 09/08/2019 1012 Gross per 24 hour  Intake 243 ml  Output --  Net 243 ml   Filed Weights   09/06/19 1820 09/07/19 2208  Weight: 99.8 kg 111.9 kg    Examination:  General exam: Appears calm and comfortable. Morbidly obese  Respiratory system: course breath sounds b/l. Wheezes b/l Cardiovascular system: S1 & S2 +. No rubs, gallops or clicks. RLE edema  Gastrointestinal system:  Abdomen is obese, soft and nontender.  Hypoactive bowel sounds heard. Central nervous system: Alert and oriented. Moves all 4 extremities  Psychiatry: Judgement and insight appear normal. Mood & affect appropriate.     Data Reviewed: I have personally reviewed following labs and imaging studies  CBC: Recent Labs  Lab 09/06/19 1840 09/08/19 0805  WBC 8.1 7.7  HGB 10.5* 10.4*  HCT 37.2 36.4  MCV 69.9* 69.1*  PLT 364 767   Basic Metabolic Panel: Recent Labs  Lab 09/06/19 1840 09/08/19 0805  NA 139 141  K 5.0 5.0  CL 103 102  CO2 28 29  GLUCOSE 104* 96  BUN 30* 34*  CREATININE 1.12* 1.25*  CALCIUM 8.3* 8.4*   GFR: Estimated Creatinine Clearance: 46.3 mL/min (A) (by C-G formula based on SCr of 1.25 mg/dL (H)). Liver Function Tests: No results for input(s): AST, ALT, ALKPHOS, BILITOT, PROT, ALBUMIN in the last 168 hours. No results for input(s): LIPASE, AMYLASE in the last 168 hours. No results for input(s): AMMONIA in the last 168 hours. Coagulation Profile: No results for input(s): INR, PROTIME in the last 168 hours. Cardiac Enzymes: No results for input(s): CKTOTAL, CKMB, CKMBINDEX, TROPONINI in the last 168 hours. BNP (last 3 results) No results for input(s): PROBNP in the last 8760 hours. HbA1C: No results for input(s): HGBA1C in the last 72 hours. CBG: Recent Labs  Lab 09/07/19 1030 09/07/19 1613 09/07/19 2202 09/08/19 0741 09/08/19  [place] 99 100* 143*   Lipid Profile: No results for input(s): CHOL, HDL, LDLCALC, TRIG, CHOLHDL, LDLDIRECT in the last 72 hours. Thyroid Function Tests: No results for input(s): TSH, T4TOTAL, FREET4, T3FREE, THYROIDAB in the last 72 hours. Anemia Panel: No results for input(s): VITAMINB12, FOLATE, FERRITIN, TIBC, IRON, RETICCTPCT in the last 72 hours. Sepsis Labs: No results for input(s): PROCALCITON, LATICACIDVEN in the last 168 hours.  Recent Results (from the past 240 hour(s))  SARS Coronavirus 2 by RT  PCR (hospital order, performed in [facility] hospital lab) Nasopharyngeal Nasopharyngeal Swab     Status: None   Collection Time: 09/07/19  2:47 AM   Specimen: Nasopharyngeal Swab  Result Value Ref Range Status   SARS Coronavirus 2 NEGATIVE NEGATIVE Final    Comment: (NOTE) SARS-CoV-2 target nucleic acids are NOT DETECTED.  The SARS-CoV-2 RNA is generally detectable in upper and lower respiratory specimens during the acute phase of infection. The lowest concentration of SARS-CoV-2 viral copies this assay can detect is 250 copies / mL. A negative result does not preclude SARS-CoV-2 infection and should not be used as the sole basis for treatment or other patient management decisions.  A negative result may occur with improper specimen collection / handling, submission of specimen other than nasopharyngeal swab, presence of viral mutation(s) within the areas targeted by this assay, and inadequate number of viral copies (<250 copies / mL). A negative result must be combined with clinical observations, patient history, and epidemiological information.  Fact Sheet for Patients:   StrictlyIdeas.no  Fact Sheet for Healthcare Providers: BankingDealers.co.za  This test is not yet approved or  cleared by the [place] FDA and has been authorized for detection and/or diagnosis of SARS-CoV-2 by FDA under an Emergency Use Authorization (EUA).  This EUA will remain in effect (meaning this test can be used) for the duration of the COVID-19 declaration under Section 564(b)(1) of the Act, 21 U.S.C. section 360bbb-3(b)(1), unless the authorization is terminated or revoked sooner.  Performed at [facility], [address] [place], [place] [address]          Radiology Studies: DG Chest 1 View  Result Date: 09/06/2019 CLINICAL DATA:  Shortness of breath and right leg swelling EXAM: CHEST  1 VIEW COMPARISON:  Radiograph 08/14/2019  FINDINGS: Hazy interstitial opacities are present within the lungs with some peripheral septal thickening and mild central vascular congestion. No focal consolidation. No pneumothorax or visible effusion. Prominent central pulmonary arteries may reflect some pulmonary artery hypertension, similar to comparison is. The aorta is calcified. The remaining cardiomediastinal contours are unremarkable. No acute osseous or soft tissue abnormality. Degenerative changes are present in the imaged spine and shoulders. Nasal cannula overlies the upper chest. IMPRESSION: Some hazy interstitial opacities with vascular congestion could reflect mild interstitial edema. Aortic Atherosclerosis (ICD10-I70.0). Mild central pulmonary artery prominence could reflect chronic pulmonary artery hypertension given presence on multiple comparisons. Electronically Signed   By: Lovena Le M.D.   On: 09/06/2019 19:10   US Venous Img Lower Unilateral Right (DVT)  Result Date: 09/07/2019 CLINICAL DATA:  Lower extremity swelling. EXAM: RIGHT LOWER EXTREMITY VENOUS DOPPLER ULTRASOUND TECHNIQUE: Gray-scale sonography with compression, as well as color and duplex ultrasound, were performed to evaluate the deep venous system(s) from the level of the common femoral vein through the popliteal and proximal calf veins. COMPARISON:  07/09/2019. FINDINGS: VENOUS Normal compressibility of the common femoral, superficial femoral, and popliteal veins, as well as the visualized calf  veins. Limited visualization of the peroneal veins. Visualized portions of profunda femoral vein and great saphenous vein unremarkable. No filling defects to suggest DVT on grayscale or color Doppler imaging. Doppler waveforms show normal direction of venous flow, normal respiratory plasticity and response to augmentation. Limited views of the contralateral common femoral vein are unremarkable. OTHER None. IMPRESSION: Negative exam.  No evidence of right lower extremity DVT.  Electronically Signed   By: [place]   On: 09/07/2019 12:14        Scheduled Meds: . allopurinol  100 mg Oral Daily  . colchicine  0.6 mg Oral Daily  . enoxaparin (LOVENOX) injection  40 mg Subcutaneous Q24H  . furosemide  40 mg Intravenous Q12H  . insulin aspart  0-9 Units Subcutaneous QID  . insulin glargine  15 Units Subcutaneous QHS  . ipratropium-albuterol  3 mL Nebulization TID  . loratadine  10 mg Oral Daily  . melatonin  5 mg Oral QHS  . methylPREDNISolone (SOLU-MEDROL) injection  40 mg Intravenous Q12H  . metoprolol tartrate  50 mg Oral BID  . sodium chloride flush  3 mL Intravenous Q12H   Continuous Infusions: . sodium chloride       LOS: 0 days    Time spent: 33 mins     Wyvonnia Dusky, MD [facility] Pager [phone]  If 7PM-7AM, please contact night-coverage [email or web address] 09/08/2019, 1:48 PM

## 2019-09-08 NOTE — Progress Notes (Signed)
Patient refused to have home inhaler secured. This RN made the patient aware that we needed to document what medications she is taking while here at the hospital and has nebulizer treatments available if she needs them. She assured me she would not use this medication and put it away in her purse. Charge RN made aware.

## 2019-09-08 NOTE — Evaluation (Signed)
Physical Therapy Evaluation Patient Details Name: Summit Borchardt MRN: [id number] DOB: Jun 08, 1940 Today's Date: 09/08/2019   History of Present Illness  79 y.o. female with a known history of diastolic heart failure, COPD, type 2 diabetes mellitus and hypertension, presented to the emergency room with worsening dyspnea for the last several days without significant cough or wheezing.  She has been having dyspnea on exertion and worsening lower extremity edema. Pt uses a WC at baseline. Performs 2-3 step MOD I transfers to/from [facility] and propels with feet.`  Clinical Impression  Pt sitting in recliner on arrival, reports feeling that she has just gotten weaker and weaker and though she is feeling a little better today does not have confidence to do much.  She was able to rise to standing w/ heavy effort and cuing, but did not need direct assist.  She was only able to take 2-3 small, guarded steps with heavy reliance on the walker.  Pt unsafe to d/c to living alone at her hotel room - suggesting d/c to [place].    Follow Up Recommendations SNF    Equipment Recommendations  None recommended by PT (she is working on getting new motorized w/c)    Recommendations for Other Services       Precautions / Restrictions Precautions Precautions: Fall Restrictions Weight Bearing Restrictions: No      Mobility  Bed Mobility               General bed mobility comments: in recliner on arrival, does not wish to get to bed  Transfers Overall transfer level: Needs assistance Equipment used: Rolling walker (2 wheeled) Transfers: Sit to/from Stand Sit to Stand: Min guard            Ambulation/Gait Ambulation/Gait assistance: Herbalist (Feet): 3 Feet Assistive device: Rolling walker (2 wheeled)       General Gait Details: Pt hesitant to do more than just a few steps with walker before needing to sit back down.  At baseline she does little more than transfers secondary to "the  gout just busting up the bones in my feet"  Stairs            Wheelchair Mobility    Modified Rankin (Stroke Patients Only)       Balance Overall balance assessment: Needs assistance   Sitting balance-Leahy Scale: Normal       Standing balance-Leahy Scale: Fair Standing balance comment: reliant use of walker with verbalized hesitancy to put much weight through feet.                                Pertinent Vitals/Pain Pain Assessment: 0-10 Pain Score: 4  Pain Location:  (R ankle and L knee swollen, sore)    Home Living Family/patient expects to be discharged to:: Skilled nursing facility                 Additional Comments: Living at hotel but wants to go to retirement facility/ALF    Prior Function Level of Independence: Needs assistance   Gait / Transfers Assistance Needed: Uses heels of B feet to propel self in manual w/c (doesn't use hands to propel w/c d/t h/o hand issues); has power w/c but is in the "shop" and prefers to use manual w/c.  ADL's / Homemaking Assistance Needed: Endorses she is independent with bathing, dressing, toileting with increased time        Hand Dominance  Dominant Hand: Right    Extremity/Trunk Assessment   Upper Extremity Assessment Upper Extremity Assessment: Generalized weakness    Lower Extremity Assessment Lower Extremity Assessment: Generalized weakness       Communication   Communication: No difficulties  Cognition Arousal/Alertness: Awake/alert Behavior During Therapy: WFL for tasks assessed/performed Overall Cognitive Status: Within Functional Limits for tasks assessed                                 General Comments: remains very talkative t/o session      General Comments      Exercises     Assessment/Plan    PT Assessment Patient needs continued PT services  PT Problem List Decreased strength;Decreased range of motion;Decreased activity tolerance;Decreased  balance;Decreased mobility;Decreased knowledge of use of DME;Decreased safety awareness       PT Treatment Interventions DME instruction;Functional mobility training;Therapeutic activities;Therapeutic exercise;Wheelchair mobility training;Patient/family education;Neuromuscular re-education;Balance training;Gait training    PT Goals (Current goals can be found in the Care Plan section)  Acute Rehab PT Goals Patient Stated Goal: find an ALF to transition to PT Goal Formulation: With patient Time For Goal Achievement: 09/22/19 Potential to Achieve Goals: Good    Frequency Min 2X/week   Barriers to discharge        Co-evaluation               AM-PAC PT "6 Clicks" Mobility  Outcome Measure Help needed turning from your back to your side while in a flat bed without using bedrails?: A Little Help needed moving from lying on your back to sitting on the side of a flat bed without using bedrails?: A Little Help needed moving to and from a bed to a chair (including a wheelchair)?: A Little Help needed standing up from a chair using your arms (e.g., wheelchair or bedside chair)?: A Little Help needed to walk in hospital room?: Total Help needed climbing 3-5 steps with a railing? : Total 6 Click Score: 14    End of Session Equipment Utilized During Treatment: Gait belt Activity Tolerance: Patient limited by fatigue Patient left: with call bell/phone within reach;with chair alarm set Nurse Communication: Mobility status PT Visit Diagnosis: Muscle weakness (generalized) (M62.81);Difficulty in walking, not elsewhere classified (R26.2);Unsteadiness on feet (R26.81)    Time: 1000-1030 PT Time Calculation (min) (ACUTE ONLY): 30 min   Charges:   PT Evaluation $PT Eval Low Complexity: 1 Low          Kreg Shropshire, DPT 09/08/2019, 1:28 PM

## 2019-09-08 NOTE — Progress Notes (Signed)
Patient refusing care at this time. When asking if this RN could check to see if she is wet she stated it is 3 AM and to get out of her room and let her sleep. Unsure if patient has voided since 40 mg IV Lasix dose at 2244.

## 2019-09-08 NOTE — Progress Notes (Addendum)
Patient is refusing all care at this time. Vital signs, toileting, labs. When asking if we can perform these things she says to get out of her room and let her sleep. Lab tech is willing to come back around 8am. Patient educated about possibly sitting in urine and she says she does not care and she is comfortable. On NP made aware.

## 2019-09-08 NOTE — Progress Notes (Signed)
[facility]    SUBJECTIVE: The patient denies chest pain or pain in general. She states her breathing is still labored. She is currently on supplemental oxygen, which she states she has not used at home for several years.   Vitals:   09/07/19 1857 09/07/19 2208 09/07/19 2241 09/08/19 0732  BP: (!) 141/67 (!) 168/66  (!) 144/57  Pulse: 60 (!) 58 61 (!) 58  Resp: 17 20  20   Temp: 98 F (36.7 C) 98.3 F (36.8 C)  97.9 F (36.6 C)  TempSrc: Oral   Oral  SpO2: 95% 96%  97%  Weight:  111.9 kg    Height:  5\' 6"  (1.676 m)       Intake/Output Summary (Last 24 hours) at 09/08/2019 0959 Last data filed at 09/07/2019 2245 Gross per 24 hour  Intake 3 ml  Output --  Net 3 ml      PHYSICAL EXAM  General: Well developed, well nourished, obese elderly female sitting in recliner eating breakfast, in no acute distress HEENT:  Normocephalic and atramatic Neck:  No JVD.  Lungs: normal effort of breathing on O2 via [place], diminished breath sounds throughout, expiratory wheezing, no audible crackles Heart: HRRR . Normal S1 and S2 without gallops or murmurs.  Abdomen: nondistended Msk:  Back normal, gait not assessed. No obvious deformities Extremities: mild lower extremity edema, R>L, no significant erythema or warmth Neuro: Alert and oriented X 3. Psych:  Good affect, responds appropriately   LABS: Basic Metabolic Panel: Recent Labs    09/06/19 1840  NA 139  K 5.0  CL 103  CO2 28  GLUCOSE 104*  BUN 30*  CREATININE 1.12*  CALCIUM 8.3*   Liver Function Tests: No results for input(s): AST, ALT, ALKPHOS, BILITOT, PROT, ALBUMIN in the last 72 hours. No results for input(s): LIPASE, AMYLASE in the last 72 hours. CBC: Recent Labs    09/06/19 1840  WBC 8.1  HGB 10.5*  HCT 37.2  MCV 69.9*  PLT 364   Cardiac Enzymes: No results for input(s): CKTOTAL, CKMB, CKMBINDEX, TROPONINI in the last 72 hours. BNP: Invalid input(s): POCBNP D-Dimer: No results for input(s): DDIMER in  the last 72 hours. Hemoglobin A1C: No results for input(s): HGBA1C in the last 72 hours. Fasting Lipid Panel: No results for input(s): CHOL, HDL, LDLCALC, TRIG, CHOLHDL, LDLDIRECT in the last 72 hours. Thyroid Function Tests: No results for input(s): TSH, T4TOTAL, T3FREE, THYROIDAB in the last 72 hours.  Invalid input(s): FREET3 Anemia Panel: No results for input(s): VITAMINB12, FOLATE, FERRITIN, TIBC, IRON, RETICCTPCT in the last 72 hours.  DG Chest 1 View  Result Date: 09/06/2019 CLINICAL DATA:  Shortness of breath and right leg swelling EXAM: CHEST  1 VIEW COMPARISON:  Radiograph 08/14/2019 FINDINGS: Hazy interstitial opacities are present within the lungs with some peripheral septal thickening and mild central vascular congestion. No focal consolidation. No pneumothorax or visible effusion. Prominent central pulmonary arteries may reflect some pulmonary artery hypertension, similar to comparison is. The aorta is calcified. The remaining cardiomediastinal contours are unremarkable. No acute osseous or soft tissue abnormality. Degenerative changes are present in the imaged spine and shoulders. Nasal cannula overlies the upper chest. IMPRESSION: Some hazy interstitial opacities with vascular congestion could reflect mild interstitial edema. Aortic Atherosclerosis (ICD10-I70.0). Mild central pulmonary artery prominence could reflect chronic pulmonary artery hypertension given presence on multiple comparisons. Electronically Signed   By: Lovena Le M.D.   On: 09/06/2019 19:10   US Venous Img Lower Unilateral Right (DVT)  Result Date: 09/07/2019 CLINICAL DATA:  Lower extremity swelling. EXAM: RIGHT LOWER EXTREMITY VENOUS DOPPLER ULTRASOUND TECHNIQUE: Gray-scale sonography with compression, as well as color and duplex ultrasound, were performed to evaluate the deep venous system(s) from the level of the common femoral vein through the popliteal and proximal calf veins. COMPARISON:  07/09/2019.  FINDINGS: VENOUS Normal compressibility of the common femoral, superficial femoral, and popliteal veins, as well as the visualized calf veins. Limited visualization of the peroneal veins. Visualized portions of profunda femoral vein and great saphenous vein unremarkable. No filling defects to suggest DVT on grayscale or color Doppler imaging. Doppler waveforms show normal direction of venous flow, normal respiratory plasticity and response to augmentation. Limited views of the contralateral common femoral vein are unremarkable. OTHER None. IMPRESSION: Negative exam.  No evidence of right lower extremity DVT. Electronically Signed   By: [place]   On: 09/07/2019 12:14     Echo LVEF 55-60%  TELEMETRY: sinus rhythm  ASSESSMENT AND PLAN:  Active Problems:   Acute CHF (congestive heart failure) ([place])    1. Acute on chronic diastolic CHF, with multiple ER visit and admissions for the same. Most recent echocardiogram in 06/2019 revealed normal LV function. Troponin normal. BNP 171. 2. Essential hypertension 3. COPD, not on chronic O2 therapy at home 4. Type II diabetes  Recommendations: 1. Agree with current therapy 2. Continue IV Lasix 40 mg BID with careful monitoring of renal status I&Os 3. Continue metoprolol tartrate 50 mg BID 4. No further cardiac diagnostics recommended at this time.   Clabe Seal, PA-C 09/08/2019 9:59 AM

## 2019-09-09 DIAGNOSIS — J9611 Chronic respiratory failure with hypoxia: Secondary | ICD-10-CM

## 2019-09-09 LAB — GLUCOSE, CAPILLARY
Glucose-Capillary: 194 mg/dL — ABNORMAL HIGH (ref 70–99)
Glucose-Capillary: 186 mg/dL — ABNORMAL HIGH (ref 70–99)

## 2019-09-09 LAB — CBC
HCT: 32.4 % — ABNORMAL LOW (ref 36.0–46.0)
Hemoglobin: 9.5 g/dL — ABNORMAL LOW (ref 12.0–15.0)
MCH: 19.8 pg — ABNORMAL LOW (ref 26.0–34.0)
MCHC: 29.3 g/dL — ABNORMAL LOW (ref 30.0–36.0)
MCV: 67.6 fL — ABNORMAL LOW (ref 80.0–100.0)
Platelets: 370 10*3/uL (ref 150–400)
RBC: 4.79 MIL/uL (ref 3.87–5.11)
RDW: 21.2 % — ABNORMAL HIGH (ref 11.5–15.5)
WBC: 9.7 10*3/uL (ref 4.0–10.5)
nRBC: 0 % (ref 0.0–0.2)

## 2019-09-09 LAB — BASIC METABOLIC PANEL
Anion gap: 11 (ref 5–15)
BUN: 50 mg/dL — ABNORMAL HIGH (ref 8–23)
CO2: 28 mmol/L (ref 22–32)
Calcium: 8.4 mg/dL — ABNORMAL LOW (ref 8.9–10.3)
Chloride: 97 mmol/L — ABNORMAL LOW (ref 98–111)
Creatinine, Ser: 1.5 mg/dL — ABNORMAL HIGH (ref 0.44–1.00)
GFR calc Af Amer: 38 mL/min — ABNORMAL LOW (ref >60)
GFR calc non Af Amer: 33 mL/min — ABNORMAL LOW (ref >60)
Glucose, Bld: 250 mg/dL — ABNORMAL HIGH (ref 70–99)
Potassium: 5 mmol/L (ref 3.5–5.1)
Sodium: 136 mmol/L (ref 135–145)

## 2019-09-09 MED ORDER — POLYVINYL ALCOHOL 1.4 % OP SOLN
OPHTHALMIC | Status: DC | PRN
  Filled 2019-09-09: qty 15

## 2019-09-09 MED ORDER — GERHARDT'S BUTT CREAM
TOPICAL_CREAM | Freq: Four times a day (QID) | CUTANEOUS | Status: DC
  Filled 2019-09-09: qty 1

## 2019-09-09 MED ORDER — LOPERAMIDE HCL 2 MG PO CAPS
2.0000 mg | ORAL_CAPSULE | Freq: Once | ORAL | Status: AC
  Administered 2019-09-09: 2 mg via ORAL
  Filled 2019-09-09: qty 1

## 2019-09-09 MED ORDER — HYDROCORTISONE 1 % EX CREA
TOPICAL_CREAM | Freq: Two times a day (BID) | CUTANEOUS | Status: DC
  Filled 2019-09-09 (×2): qty 28

## 2019-09-09 MED ORDER — ENOXAPARIN SODIUM 40 MG/0.4ML ~~LOC~~ SOLN
40.0000 mg | SUBCUTANEOUS | Status: DC
  Administered 2019-09-11: 40 mg via SUBCUTANEOUS
  Filled 2019-09-09 (×3): qty 0.4

## 2019-09-09 MED ORDER — DIPHENHYDRAMINE HCL 25 MG PO CAPS: 50.0000 mg | ORAL_CAPSULE | Freq: Every evening | ORAL | Status: DC | PRN

## 2019-09-09 MED ORDER — FERROUS SULFATE 325 (65 FE) MG PO TABS
325.0000 mg | ORAL_TABLET | Freq: Two times a day (BID) | ORAL | Status: DC
  Administered 2019-09-10 – 2019-09-12 (×3): 325 mg via ORAL
  Filled 2019-09-09 (×4): qty 1

## 2019-09-09 NOTE — Progress Notes (Addendum)
Pt refused lasix at 2200 and states would like to only takes it in the morning. Pt is also complaining of burning around her labia every time she pee. Vallarie Mare NP. Will continue to monitor.  Update 0245: NP Randol Kern states will place order. Will continue to monitor.

## 2019-09-09 NOTE — Progress Notes (Signed)
PROGRESS NOTE    Karla Sanchez  JXB:[id number] DOB: 1940/11/26 DOA: 09/07/2019 PCP: Alfredia Client, MD   Assessment & Plan:   Active Problems:   Acute CHF (congestive heart failure) (HCC)   Acute exacerbation of CHF (congestive heart failure) (HCC)   Acute on chronic diastolic CHF: continue on IV lasix, metoprolol. Monitor I/Os and daily weights. Cardio following and recs apprec  COPD exacerbation: continue on duonebs, IV steroids. Encourage incentive spirometry   Chronic hypoxic respiratory failure: continue on supplemental oxygen. Likely secondary to COPD  DM2: will continue to hold metformin. Continue on lantus, SSI w/ accuchecks   Likely IDA: will start iron supplements. Will continue to monitor   AKI: baseline Cr is unknown. Cr is elevated, likely secondary to lasix use. Will continue to monitor   Gout: will continue allopurinol and colchicine.  Essential hypertension: will continue on metoprolol   Chronic RLE swelling: [place] is neg for DVT. Likely secondary to CHF   Generalized weakness: PT/OT recs SNF  DVT prophylaxis: lovenox Code Status: DNR Family Communication:  Disposition Plan:  D/c to SNF. Unlikely any barriers   Status is: Inpatient  Remains inpatient appropriate because:IV treatments appropriate due to intensity of illness or inability to take PO   Dispo: The patient is from: Home              Anticipated d/c is to: SNF              Anticipated d/c date is: 3 days              Patient currently is not medically stable to d/c.      Consultants:   Cardio    Procedures:   Antimicrobials:    Subjective: Pt c/o RLE edema  Objective: Vitals:   09/08/19 2008 09/09/19 0431 09/09/19 0500 09/09/19 0755  BP: 138/81 (!) 153/66  (!) 111/50  Pulse: 92 71  (!) 59  Resp: 17 18  17   Temp: 98.4 F (36.9 C)  98.5 F (36.9 C) 98.2 F (36.8 C)  TempSrc: Oral  Oral   SpO2: 94% 93%  96%  Weight:      Height:         Intake/Output Summary (Last 24 hours) at 09/09/2019 0813 Last data filed at 09/08/2019 1012 Gross per 24 hour  Intake 240 ml  Output --  Net 240 ml   Filed Weights   09/06/19 1820 09/07/19 2208  Weight: 99.8 kg 111.9 kg    Examination:  General exam: Appears calm and comfortable. Morbidly obese  Respiratory system: diminished breath sounds b/l Cardiovascular system: S1 & S2 +. No rubs, gallops or clicks. RLE edema  Gastrointestinal system: Abdomen is obese, soft and nontender.  Hypoactive bowel sounds heard. Central nervous system: Alert and oriented. Moves all 4 extremities  Psychiatry: Judgement and insight appear normal. Mood & affect appropriate.     Data Reviewed: I have personally reviewed following labs and imaging studies  CBC: Recent Labs  Lab 09/06/19 1840 09/08/19 0805  WBC 8.1 7.7  HGB 10.5* 10.4*  HCT 37.2 36.4  MCV 69.9* 69.1*  PLT 364 130   Basic Metabolic Panel: Recent Labs  Lab 09/06/19 1840 09/08/19 0805  NA 139 141  K 5.0 5.0  CL 103 102  CO2 28 29  GLUCOSE 104* 96  BUN 30* 34*  CREATININE 1.12* 1.25*  CALCIUM 8.3* 8.4*   GFR: Estimated Creatinine Clearance: 46.3 mL/min (A) (by C-G formula based on SCr of 1.25  mg/dL (H)). Liver Function Tests: No results for input(s): AST, ALT, ALKPHOS, BILITOT, PROT, ALBUMIN in the last 168 hours. No results for input(s): LIPASE, AMYLASE in the last 168 hours. No results for input(s): AMMONIA in the last 168 hours. Coagulation Profile: No results for input(s): INR, PROTIME in the last 168 hours. Cardiac Enzymes: No results for input(s): CKTOTAL, CKMB, CKMBINDEX, TROPONINI in the last 168 hours. BNP (last 3 results) No results for input(s): PROBNP in the last 8760 hours. HbA1C: No results for input(s): HGBA1C in the last 72 hours. CBG: Recent Labs  Lab 09/08/19 0741 09/08/19 1227 09/08/19 1635 09/08/19 2108 09/09/19 0756  GLUCAP 100* 143* 185* 291* 194*   Lipid Profile: No results for  input(s): CHOL, HDL, LDLCALC, TRIG, CHOLHDL, LDLDIRECT in the last 72 hours. Thyroid Function Tests: No results for input(s): TSH, T4TOTAL, FREET4, T3FREE, THYROIDAB in the last 72 hours. Anemia Panel: No results for input(s): VITAMINB12, FOLATE, FERRITIN, TIBC, IRON, RETICCTPCT in the last 72 hours. Sepsis Labs: No results for input(s): PROCALCITON, LATICACIDVEN in the last 168 hours.  Recent Results (from the past 240 hour(s))  SARS Coronavirus 2 by RT PCR (hospital order, performed in [facility] hospital lab) Nasopharyngeal Nasopharyngeal Swab     Status: None   Collection Time: 09/07/19  2:47 AM   Specimen: Nasopharyngeal Swab  Result Value Ref Range Status   SARS Coronavirus 2 NEGATIVE NEGATIVE Final    Comment: (NOTE) SARS-CoV-2 target nucleic acids are NOT DETECTED.  The SARS-CoV-2 RNA is generally detectable in upper and lower respiratory specimens during the acute phase of infection. The lowest concentration of SARS-CoV-2 viral copies this assay can detect is 250 copies / mL. A negative result does not preclude SARS-CoV-2 infection and should not be used as the sole basis for treatment or other patient management decisions.  A negative result may occur with improper specimen collection / handling, submission of specimen other than nasopharyngeal swab, presence of viral mutation(s) within the areas targeted by this assay, and inadequate number of viral copies (<250 copies / mL). A negative result must be combined with clinical observations, patient history, and epidemiological information.  Fact Sheet for Patients:   StrictlyIdeas.no  Fact Sheet for Healthcare Providers: BankingDealers.co.za  This test is not yet approved or  cleared by the [place] FDA and has been authorized for detection and/or diagnosis of SARS-CoV-2 by FDA under an Emergency Use Authorization (EUA).  This EUA will remain in effect (meaning this  test can be used) for the duration of the COVID-19 declaration under Section 564(b)(1) of the Act, 21 U.S.C. section 360bbb-3(b)(1), unless the authorization is terminated or revoked sooner.  Performed at [facility], [address], [place], [place] [address]          Radiology Studies: US Venous Img Lower Unilateral Right (DVT)  Result Date: 09/07/2019 CLINICAL DATA:  Lower extremity swelling. EXAM: RIGHT LOWER EXTREMITY VENOUS DOPPLER ULTRASOUND TECHNIQUE: Gray-scale sonography with compression, as well as color and duplex ultrasound, were performed to evaluate the deep venous system(s) from the level of the common femoral vein through the popliteal and proximal calf veins. COMPARISON:  07/09/2019. FINDINGS: VENOUS Normal compressibility of the common femoral, superficial femoral, and popliteal veins, as well as the visualized calf veins. Limited visualization of the peroneal veins. Visualized portions of profunda femoral vein and great saphenous vein unremarkable. No filling defects to suggest DVT on grayscale or color Doppler imaging. Doppler waveforms show normal direction of venous flow, normal respiratory plasticity  and response to augmentation. Limited views of the contralateral common femoral vein are unremarkable. OTHER None. IMPRESSION: Negative exam.  No evidence of right lower extremity DVT. Electronically Signed   By: [place]   On: 09/07/2019 12:14        Scheduled Meds: . allopurinol  100 mg Oral Daily  . colchicine  0.6 mg Oral Daily  . enoxaparin (LOVENOX) injection  40 mg Subcutaneous Q24H  . furosemide  40 mg Intravenous Q12H  . Gerhardt's butt cream   Topical QID  . insulin aspart  0-9 Units Subcutaneous QID  . insulin glargine  15 Units Subcutaneous QHS  . ipratropium-albuterol  3 mL Nebulization TID  . loratadine  10 mg Oral Daily  . melatonin  5 mg Oral QHS  . methylPREDNISolone (SOLU-MEDROL) injection  40 mg Intravenous Q12H  .  metoprolol tartrate  50 mg Oral BID  . sodium chloride flush  3 mL Intravenous Q12H   Continuous Infusions: . sodium chloride       LOS: 1 day    Time spent: 34 mins     Wyvonnia Dusky, MD [facility] Pager [phone]  If 7PM-7AM, please contact night-coverage [email or web address] 09/09/2019, 8:13 AM

## 2019-09-09 NOTE — Progress Notes (Signed)
[facility] Cardiology  SUBJECTIVE: Patient sitting in chair, reports feeling better overall, denies chest pain or shortness of breath   Vitals:   09/08/19 2008 09/09/19 0431 09/09/19 0500 09/09/19 0755  BP: 138/81 (!) 153/66  (!) 111/50  Pulse: 92 71  (!) 59  Resp: 17 18  17   Temp: 98.4 F (36.9 C)  98.5 F (36.9 C) 98.2 F (36.8 C)  TempSrc: Oral  Oral   SpO2: 94% 93%  96%  Weight:      Height:         Intake/Output Summary (Last 24 hours) at 09/09/2019 0839 Last data filed at 09/08/2019 1012 Gross per 24 hour  Intake 240 ml  Output --  Net 240 ml      PHYSICAL EXAM  General: Well developed, well nourished, in no acute distress HEENT:  Normocephalic and atramatic Neck:  No JVD.  Lungs: Clear bilaterally to auscultation and percussion. Heart: HRRR . Normal S1 and S2 without gallops or murmurs.  Abdomen: Bowel sounds are positive, abdomen soft and non-tender  Msk:  Back normal, normal gait. Normal strength and tone for age. Extremities: No clubbing, cyanosis or edema.   Neuro: Alert and oriented X 3. Psych:  Good affect, responds appropriately   LABS: Basic Metabolic Panel: Recent Labs    09/06/19 1840 09/08/19 0805  NA 139 141  K 5.0 5.0  CL 103 102  CO2 28 29  GLUCOSE 104* 96  BUN 30* 34*  CREATININE 1.12* 1.25*  CALCIUM 8.3* 8.4*   Liver Function Tests: No results for input(s): AST, ALT, ALKPHOS, BILITOT, PROT, ALBUMIN in the last 72 hours. No results for input(s): LIPASE, AMYLASE in the last 72 hours. CBC: Recent Labs    09/06/19 1840 09/08/19 0805  WBC 8.1 7.7  HGB 10.5* 10.4*  HCT 37.2 36.4  MCV 69.9* 69.1*  PLT 364 362   Cardiac Enzymes: No results for input(s): CKTOTAL, CKMB, CKMBINDEX, TROPONINI in the last 72 hours. BNP: Invalid input(s): POCBNP D-Dimer: No results for input(s): DDIMER in the last 72 hours. Hemoglobin A1C: No results for input(s): HGBA1C in the last 72 hours. Fasting Lipid Panel: No results for input(s): CHOL, HDL,  LDLCALC, TRIG, CHOLHDL, LDLDIRECT in the last 72 hours. Thyroid Function Tests: No results for input(s): TSH, T4TOTAL, T3FREE, THYROIDAB in the last 72 hours.  Invalid input(s): FREET3 Anemia Panel: No results for input(s): VITAMINB12, FOLATE, FERRITIN, TIBC, IRON, RETICCTPCT in the last 72 hours.  US Venous Img Lower Unilateral Right (DVT)  Result Date: 09/07/2019 CLINICAL DATA:  Lower extremity swelling. EXAM: RIGHT LOWER EXTREMITY VENOUS DOPPLER ULTRASOUND TECHNIQUE: Gray-scale sonography with compression, as well as color and duplex ultrasound, were performed to evaluate the deep venous system(s) from the level of the common femoral vein through the popliteal and proximal calf veins. COMPARISON:  07/09/2019. FINDINGS: VENOUS Normal compressibility of the common femoral, superficial femoral, and popliteal veins, as well as the visualized calf veins. Limited visualization of the peroneal veins. Visualized portions of profunda femoral vein and great saphenous vein unremarkable. No filling defects to suggest DVT on grayscale or color Doppler imaging. Doppler waveforms show normal direction of venous flow, normal respiratory plasticity and response to augmentation. Limited views of the contralateral common femoral vein are unremarkable. OTHER None. IMPRESSION: Negative exam.  No evidence of right lower extremity DVT. Electronically Signed   By: Marcello Moores  Register   On: 09/07/2019 12:14     Echo LVEF 55 to 60%  TELEMETRY: Sinus rhythm:  ASSESSMENT AND  PLAN:  Active Problems:   Acute CHF (congestive heart failure) (HCC)   Acute exacerbation of CHF (congestive heart failure) ([place])    1.  Acute on chronic diastolic congestive heart failure, multiple ER visits for similar complaints, normal left ventricular function, high sensitive troponin normal, BNP mildly elevated, clinically improved after IV furosemide and diuresis, nearing baseline 2.  Essential hypertension 3.  COPD 4.  Type 2  diabetes  Recommendations  1.  Agree with current therapy 2.  Continue diuresis 3.  Carefully monitor renal status 4.  Continue metoprolol tartrate 50 mg twice daily 5.  Stressed the importance of low-sodium no added salt diet 6.  No further diagnostics at this time   Isaias Cowman, MD, PhD, [facility] 09/09/2019 8:39 AM

## 2019-09-09 NOTE — TOC Initial Note (Signed)
Transition of Care [facility]) - Initial/Assessment Note    Patient Details  Name: Karla Sanchez MRN: [id number] Date of Birth: 1940-09-13  Transition of Care [facility]) CM/SW Contact:    Anselm Pancoast, RN Phone Number: 09/09/2019, 2:41 PM  Clinical Narrative:                 Spoke to patient at bedside and patient is very pleasant and cooperative at this time although patient does not seem to understand her limited options with discharge placement. Patient makes $2100/monthly which prevents Medicaid however she is unable to afford private pay. Patient does not want to go to group home and prefers long term care setting. Discussed in detail the financial responsibility of being in LTC. Patient prefers to stay in [place] to be close to her nephew, [name]. Patient is active with Margreta Journey, DSS who was assisting her with resources at the hotel. Patient states she is very independent and determined with her wheelchair but is getting weaker and worried about falling and getting hurt.   Expected Discharge Plan: Skilled Nursing Facility Barriers to Discharge: Continued Medical Work up   Patient Goals and CMS Choice Patient states their goals for this hospitalization and ongoing recovery are:: Get placed into a SNF and eventual LTC placement      Expected Discharge Plan and Services Expected Discharge Plan: [place]       Living arrangements for the past 2 months: Hotel/Motel (Lives in [place])                                      Prior Living Arrangements/Services Living arrangements for the past 2 months: Hotel/Motel (Lives in [place]) Lives with:: Self Patient language and need for interpreter reviewed:: Yes Do you feel safe going back to the place where you live?: No   seeking LTC placement  Need for Family Participation in Patient Care: Yes (Comment) Care giver support system in place?: No (comment) Current home services: DME (wc,  walker) Criminal Activity/Legal Involvement Pertinent to Current Situation/Hospitalization: No - Comment as needed  Activities of Daily Living Home Assistive Devices/Equipment: Wheelchair, Eyeglasses ADL Screening (condition at time of admission) Patient's cognitive ability adequate to safely complete daily activities?: Yes Is the patient deaf or have difficulty hearing?: No Does the patient have difficulty seeing, even when wearing glasses/contacts?: No Does the patient have difficulty concentrating, remembering, or making decisions?: No Patient able to express need for assistance with ADLs?: Yes Does the patient have difficulty dressing or bathing?: No Independently performs ADLs?: No Communication: Independent Dressing (OT): Independent Grooming: Independent Feeding: Independent Bathing: Independent Toileting: Independent with device (comment) In/Out Bed: Independent with device (comment) Walks in Home: Dependent Is this a change from baseline?: Pre-admission baseline Does the patient have difficulty walking or climbing stairs?: Yes Weakness of Legs: Both Weakness of Arms/Hands: None  Permission Sought/Granted Permission sought to share information with : Facility Sport and exercise psychologist, Case Optician, dispensing granted to share information with : Yes, Verbal Permission Granted  Share Information with NAME: [facility] Department  Permission granted to share info w AGENCY: Potential SNF        Emotional Assessment Appearance:: Appears stated age Attitude/Demeanor/Rapport: Engaged, Ambitious, Self-Confident Affect (typically observed): Accepting, Appropriate Orientation: : Oriented to Self, Oriented to Place, Oriented to  Time, Oriented to Situation Alcohol / Substance Use: Never Used Psych Involvement: No (comment)  Admission diagnosis:  Shortness of breath [R06.02] SOB (shortness of breath) [R06.02] Acute CHF (congestive heart failure) (HCC) [I50.9] Right leg swelling  [M79.89] Acute on chronic congestive heart failure, unspecified heart failure type (HCC) [I50.9] Acute exacerbation of CHF (congestive heart failure) ([place]) [I50.9] Patient Active Problem List   Diagnosis Date Noted  . Acute exacerbation of CHF (congestive heart failure) ([place]) 09/08/2019  . Acute CHF (congestive heart failure) ([place]) 09/07/2019  . COPD exacerbation ([place]) 07/09/2019  . Acute on chronic diastolic congestive heart failure ([place]) 07/09/2019  . DM (diabetes mellitus) ([place]) 07/09/2019  . Peripheral edema 07/09/2019  . Gout 07/09/2019  . Obesity, diabetes, and hypertension syndrome ([place]) 07/09/2019  . Anemia 07/09/2019   PCP:  Alfredia Client, MD Pharmacy:   [facility] [phone] [address], [place] [address] Phone: [phone] Fax: [phone]     Social Determinants of Health (SDOH) Interventions    Readmission Risk Interventions No flowsheet data found.

## 2019-09-09 NOTE — Progress Notes (Addendum)
Pt requested to take her weigh at 0800. Will notify incoming shift. Will continue to monitor.  Update 0547: pt refused to take lovenox at this time and prefer to take it at 0800 and so pt lab. Notify B [place]. Will continue to monitor.

## 2019-09-09 NOTE — NC FL2 (Signed)
[place] LEVEL OF CARE SCREENING TOOL     IDENTIFICATION  Patient Name: Karla Sanchez Birthdate: 05-20-1940 Sex: female Admission Date (Current Location): 09/07/2019  [facility] and [place] Number:  Engineering geologist and Address:  [facility], [address] [place], [place] [address]      Provider Number: [id number]  Attending Physician Name and Address:  Wyvonnia Dusky, MD  Relative Name and Phone Number:       Current Level of Care: Hospital Recommended Level of Care: [place] Prior Approval Number:    Date Approved/Denied:   PASRR Number:    Discharge Plan: SNF    Current Diagnoses: Patient Active Problem List   Diagnosis Date Noted   Acute exacerbation of CHF (congestive heart failure) ([place]) 09/08/2019   Acute CHF (congestive heart failure) ([place]) 09/07/2019   COPD exacerbation ([place]) 07/09/2019   Acute on chronic diastolic congestive heart failure ([place]) 07/09/2019   DM (diabetes mellitus) ([place]) 07/09/2019   Peripheral edema 07/09/2019   Gout 07/09/2019   Obesity, diabetes, and hypertension syndrome ([place]) 07/09/2019   Anemia 07/09/2019    Orientation RESPIRATION BLADDER Height & Weight     Self, Time, Situation, Place  Normal Incontinent Weight: 111.9 kg Height:  5\' 6"  (167.6 cm)  BEHAVIORAL SYMPTOMS/MOOD NEUROLOGICAL BOWEL NUTRITION STATUS      Continent Diet  AMBULATORY STATUS COMMUNICATION OF NEEDS Skin   Extensive Assist Verbally Normal                       Personal Care Assistance Level of Assistance  Bathing, Dressing Bathing Assistance: Limited assistance   Dressing Assistance: Limited assistance     Functional Limitations Info             [place]  PT (By licensed PT), OT (By licensed OT)     PT Frequency: min 5xweek OT Frequency: min 5xweek            Contractures      Additional Factors Info                  Current  Medications (09/09/2019):  This is the current hospital active medication list Current Facility-Administered Medications  Medication Dose Route Frequency Provider Last Rate Last Admin   0.9 %  sodium chloride infusion  250 mL Intravenous PRN Mansy, Jan A, MD       acetaminophen (TYLENOL) tablet 650 mg  650 mg Oral Q4H PRN Mansy, Jan A, MD       allopurinol (ZYLOPRIM) tablet 100 mg  100 mg Oral Daily Mansy, Jan A, MD   100 mg at 09/09/19 1001   ALPRAZolam (XANAX) tablet 0.5 mg  0.5 mg Oral TID PRN Sharion Settler, NP   0.5 mg at 09/07/19 2243   colchicine tablet 0.6 mg  0.6 mg Oral Daily Mansy, Jan A, MD   0.6 mg at 09/09/19 1001   diphenhydrAMINE (BENADRYL) capsule 50 mg  50 mg Oral QHS PRN Wyvonnia Dusky, MD       enoxaparin (LOVENOX) injection 40 mg  40 mg Subcutaneous Q24H Mansy, Jan A, MD       ferrous sulfate tablet 325 mg  325 mg Oral BID WC Wyvonnia Dusky, MD       furosemide (LASIX) injection 40 mg  40 mg Intravenous Q12H Mansy, Jan A, MD   40 mg at 09/08/19 1056   Gerhardt's butt cream   Topical  QID Sharion Settler, NP   Given at 09/09/19 0440   guaiFENesin-dextromethorphan (ROBITUSSIN DM) 100-10 MG/5ML syrup 15 mL  15 mL Oral Q4H PRN Christophe Louis, RN   15 mL at 09/08/19 0054   hydrocortisone cream 1 %   Topical BID Wyvonnia Dusky, MD       insulin aspart (novoLOG) injection 0-9 Units  0-9 Units Subcutaneous QID Mansy, Jan A, MD   5 Units at 09/08/19 2127   insulin glargine (LANTUS) injection 15 Units  15 Units Subcutaneous QHS Mansy, Jan A, MD   15 Units at 09/08/19 2128   ipratropium-albuterol (DUONEB) 0.5-2.5 (3) MG/3ML nebulizer solution 3 mL  3 mL Nebulization TID Wyvonnia Dusky, MD   3 mL at 09/08/19 1928   loratadine (CLARITIN) tablet 10 mg  10 mg Oral Daily Mansy, Jan A, MD   10 mg at 09/09/19 1001   melatonin tablet 5 mg  5 mg Oral QHS Sharion Settler, NP   5 mg at 09/08/19 2129   methylPREDNISolone sodium succinate (SOLU-MEDROL) 40  mg/mL injection 40 mg  40 mg Intravenous Q12H Wyvonnia Dusky, MD   40 mg at 09/08/19 2126   metoprolol tartrate (LOPRESSOR) tablet 50 mg  50 mg Oral BID Mansy, Jan A, MD   50 mg at 09/09/19 1001   ondansetron (ZOFRAN) injection 4 mg  4 mg Intravenous Q6H PRN Mansy, Jan A, MD       polyvinyl alcohol (LIQUIFILM TEARS) 1.4 % ophthalmic solution   Both Eyes PRN Sharion Settler, NP   Given at 09/09/19 0400   sodium chloride flush (NS) 0.9 % injection 3 mL  3 mL Intravenous Q12H Mansy, Jan A, MD   3 mL at 09/08/19 2200   sodium chloride flush (NS) 0.9 % injection 3 mL  3 mL Intravenous PRN Mansy, Arvella Merles, MD         Discharge Medications: Please see discharge summary for a list of discharge medications.  Relevant Imaging Results:  Relevant Lab Results:   Additional Information CV#[id number]  Anselm Pancoast, RN

## 2019-09-09 NOTE — Plan of Care (Signed)

## 2019-09-09 NOTE — Progress Notes (Addendum)
Pt refused to check blood sugar. Pt refused all medicines despite education. Notify NP Randol Kern.Will continue to monitor.  Update 0647: Pt requested to have all her vitals and weight done at 0800. Will continue to monitor.

## 2019-09-09 NOTE — Plan of Care (Signed)
  Problem: Health Behavior/Discharge Planning: Goal: Ability to manage health-related needs will improve Outcome: Not Progressing Note: Pt refusing all medicines

## 2019-09-10 LAB — BASIC METABOLIC PANEL
Anion gap: 10 (ref 5–15)
BUN: 57 mg/dL — ABNORMAL HIGH (ref 8–23)
CO2: 28 mmol/L (ref 22–32)
Calcium: 8.6 mg/dL — ABNORMAL LOW (ref 8.9–10.3)
Chloride: 100 mmol/L (ref 98–111)
Creatinine, Ser: 1.38 mg/dL — ABNORMAL HIGH (ref 0.44–1.00)
GFR calc Af Amer: 42 mL/min — ABNORMAL LOW (ref >60)
GFR calc non Af Amer: 36 mL/min — ABNORMAL LOW (ref >60)
Glucose, Bld: 173 mg/dL — ABNORMAL HIGH (ref 70–99)
Potassium: 5.3 mmol/L — ABNORMAL HIGH (ref 3.5–5.1)
Sodium: 138 mmol/L (ref 135–145)

## 2019-09-10 LAB — CBC
HCT: 33.8 % — ABNORMAL LOW (ref 36.0–46.0)
Hemoglobin: 9.7 g/dL — ABNORMAL LOW (ref 12.0–15.0)
MCH: 19.8 pg — ABNORMAL LOW (ref 26.0–34.0)
MCHC: 28.7 g/dL — ABNORMAL LOW (ref 30.0–36.0)
MCV: 68.8 fL — ABNORMAL LOW (ref 80.0–100.0)
Platelets: 366 10*3/uL (ref 150–400)
RBC: 4.91 MIL/uL (ref 3.87–5.11)
RDW: 21.2 % — ABNORMAL HIGH (ref 11.5–15.5)
WBC: 7.9 10*3/uL (ref 4.0–10.5)
nRBC: 0 % (ref 0.0–0.2)

## 2019-09-10 LAB — GLUCOSE, CAPILLARY
Glucose-Capillary: 123 mg/dL — ABNORMAL HIGH (ref 70–99)
Glucose-Capillary: 155 mg/dL — ABNORMAL HIGH (ref 70–99)
Glucose-Capillary: 136 mg/dL — ABNORMAL HIGH (ref 70–99)
Glucose-Capillary: 126 mg/dL — ABNORMAL HIGH (ref 70–99)

## 2019-09-10 MED ORDER — IPRATROPIUM-ALBUTEROL 0.5-2.5 (3) MG/3ML IN SOLN: 3.0000 mL | RESPIRATORY_TRACT | Status: DC | PRN

## 2019-09-10 NOTE — Progress Notes (Signed)
PROGRESS NOTE    Karla Sanchez  LGX:[id number] DOB: [birth year] DOA: 09/07/2019 PCP: Alfredia Client, MD   Assessment & Plan:   Active Problems:   Acute CHF (congestive heart failure) (HCC)   Acute exacerbation of CHF (congestive heart failure) ([place])  Noncompliance: pt is refusing tele and all medications this morning.   Acute on chronic diastolic CHF: continue on IV lasix, metoprolol. Monitor I/Os and daily weights. Cardio following and recs apprec  COPD exacerbation: continue on duonebs, IV steroids. Encourage incentive spirometry   Chronic hypoxic respiratory failure: continue on supplemental oxygen. Likely secondary to COPD  DM2: will continue to hold metformin. 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 Will continue to monitor   AKI: baseline Cr is unknown. Cr is elevated, likely secondary to lasix use. Will continue to monitor   Gout: will continue allopurinol and colchicine.  Essential hypertension: will continue on metoprolol   Chronic RLE swelling: [place] is neg for DVT. Likely secondary to CHF   Generalized weakness: PT/OT recs SNF  DVT prophylaxis: lovenox Code Status: DNR Family Communication:  Disposition Plan:  D/c to SNF. Barriers includes pt not taking any medications to improve above stated problems   Status is: Inpatient  Remains inpatient appropriate because:IV treatments appropriate due to intensity of illness or inability to take PO   Dispo: The patient is from: Home              Anticipated d/c is to: SNF              Anticipated d/c date is: 3 days              Patient currently is not medically stable to d/c.      Consultants:   Cardio    Procedures:   Antimicrobials:    Subjective: Pt c/o not sleeping well  Objective: Vitals:   09/09/19 1126 09/09/19 1605 09/09/19 2009 09/10/19 0108  BP: (!) 124/40 (!) 141/42 (!) 108/51 (!) 159/54  Pulse: (!) 54 (!) 55 64 71  Resp: 17  17    Temp: 97.6 F (36.4 C) 98.3 F (36.8 C) 97.7 F (36.5 C) 97.8 F (36.6 C)  TempSrc: Oral  Oral Oral  SpO2: 100% 98% 95% 96%  Weight:      Height:        Intake/Output Summary (Last 24 hours) at 09/10/2019 0748 Last data filed at 09/10/2019 0108 Gross per 24 hour  Intake 960 ml  Output 0 ml  Net 960 ml   Filed Weights   09/06/19 1820 09/07/19 2208  Weight: 99.8 kg 111.9 kg    Examination:  General exam: Appears calm and comfortable. Morbidly obese  Respiratory system: course breath sounds b/l.  Cardiovascular system: S1 & S2 +. No rubs, gallops or clicks. RLE edema  Gastrointestinal system: Abdomen is obese, soft and nontender.  Hypoactive bowel sounds heard. Central nervous system: Alert and oriented. Moves all 4 extremities  Psychiatry: Judgement and insight appear normal. Agitated and frustrated    Data Reviewed: I have personally reviewed following labs and imaging studies  CBC: Recent Labs  Lab 09/06/19 1840 09/08/19 0805 09/09/19 0956  WBC 8.1 7.7 9.7  HGB 10.5* 10.4* 9.5*  HCT 37.2 36.4 32.4*  MCV 69.9* 69.1* 67.6*  PLT 364 362 814   Basic Metabolic Panel: Recent Labs  Lab 09/06/19 1840 09/08/19 0805 09/09/19 0956  NA 139 141 136  K 5.0 5.0 5.0  CL 103  102 97*  CO2 28 29 28   GLUCOSE 104* 96 250*  BUN 30* 34* 50*  CREATININE 1.12* 1.25* 1.50*  CALCIUM 8.3* 8.4* 8.4*   GFR: Estimated Creatinine Clearance: 38.6 mL/min (A) (by C-G formula based on SCr of 1.5 mg/dL (H)). Liver Function Tests: No results for input(s): AST, ALT, ALKPHOS, BILITOT, PROT, ALBUMIN in the last 168 hours. No results for input(s): LIPASE, AMYLASE in the last 168 hours. No results for input(s): AMMONIA in the last 168 hours. Coagulation Profile: No results for input(s): INR, PROTIME in the last 168 hours. Cardiac Enzymes: No results for input(s): CKTOTAL, CKMB, CKMBINDEX, TROPONINI in the last 168 hours. BNP (last 3 results) No results for input(s): PROBNP in the  last 8760 hours. HbA1C: No results for input(s): HGBA1C in the last 72 hours. CBG: Recent Labs  Lab 09/08/19 1227 09/08/19 1635 09/08/19 2108 09/09/19 0756 09/09/19 1152  GLUCAP 143* 185* 291* 194* 186*   Lipid Profile: No results for input(s): CHOL, HDL, LDLCALC, TRIG, CHOLHDL, LDLDIRECT in the last 72 hours. Thyroid Function Tests: No results for input(s): TSH, T4TOTAL, FREET4, T3FREE, THYROIDAB in the last 72 hours. Anemia Panel: No results for input(s): VITAMINB12, FOLATE, FERRITIN, TIBC, IRON, RETICCTPCT in the last 72 hours. Sepsis Labs: No results for input(s): PROCALCITON, LATICACIDVEN in the last 168 hours.  Recent Results (from the past 240 hour(s))  SARS Coronavirus 2 by RT PCR (hospital order, performed in [facility] hospital lab) Nasopharyngeal Nasopharyngeal Swab     Status: None   Collection Time: 09/07/19  2:47 AM   Specimen: Nasopharyngeal Swab  Result Value Ref Range Status   SARS Coronavirus 2 NEGATIVE NEGATIVE Final    Comment: (NOTE) SARS-CoV-2 target nucleic acids are NOT DETECTED.  The SARS-CoV-2 RNA is generally detectable in upper and lower respiratory specimens during the acute phase of infection. The lowest concentration of SARS-CoV-2 viral copies this assay can detect is 250 copies / mL. A negative result does not preclude SARS-CoV-2 infection and should not be used as the sole basis for treatment or other patient management decisions.  A negative result may occur with improper specimen collection / handling, submission of specimen other than nasopharyngeal swab, presence of viral mutation(s) within the areas targeted by this assay, and inadequate number of viral copies (<250 copies / mL). A negative result must be combined with clinical observations, patient history, and epidemiological information.  Fact Sheet for Patients:   StrictlyIdeas.no  Fact Sheet for Healthcare  Providers: BankingDealers.co.za  This test is not yet approved or  cleared by the [place] FDA and has been authorized for detection and/or diagnosis of SARS-CoV-2 by FDA under an Emergency Use Authorization (EUA).  This EUA will remain in effect (meaning this test can be used) for the duration of the COVID-19 declaration under Section 564(b)(1) of the Act, 21 U.S.C. section 360bbb-3(b)(1), unless the authorization is terminated or revoked sooner.  Performed at [facility], [address] [place], [place] [address]          Radiology Studies: No results found.      Scheduled Meds: . allopurinol  100 mg Oral Daily  . colchicine  0.6 mg Oral Daily  . enoxaparin (LOVENOX) injection  40 mg Subcutaneous Q24H  . ferrous sulfate  325 mg Oral BID WC  . furosemide  40 mg Intravenous Q12H  . Gerhardt's butt cream   Topical QID  . hydrocortisone cream   Topical BID  . insulin aspart  0-9 Units Subcutaneous  QID  . insulin glargine  15 Units Subcutaneous QHS  . ipratropium-albuterol  3 mL Nebulization TID  . loratadine  10 mg Oral Daily  . melatonin  5 mg Oral QHS  . methylPREDNISolone (SOLU-MEDROL) injection  40 mg Intravenous Q12H  . metoprolol tartrate  50 mg Oral BID  . sodium chloride flush  3 mL Intravenous Q12H   Continuous Infusions: . sodium chloride       LOS: 2 days    Time spent: 30 mins     Wyvonnia Dusky, MD [facility] Pager [phone]  If 7PM-7AM, please contact night-coverage [email or web address] 09/10/2019, 7:48 AM

## 2019-09-10 NOTE — Progress Notes (Signed)
[facility] Cardiology  SUBJECTIVE: Patient sitting in chair, denies chest pain, reports improved breathing   Vitals:   09/09/19 2009 09/10/19 0108 09/10/19 0836 09/10/19 1143  BP: (!) 108/51 (!) 159/54 (!) 149/61 (!) 163/61  Pulse: 64 71 (!) 59 70  Resp:   16 19  Temp: 97.7 F (36.5 C) 97.8 F (36.6 C) (!) 97.5 F (36.4 C)   TempSrc: Oral Oral    SpO2: 95% 96% 100% 93%  Weight:      Height:         Intake/Output Summary (Last 24 hours) at 09/10/2019 1355 Last data filed at 09/10/2019 0950 Gross per 24 hour  Intake 1080 ml  Output 0 ml  Net 1080 ml      PHYSICAL EXAM  General: Well developed, well nourished, in no acute distress HEENT:  Normocephalic and atramatic Neck:  No JVD.  Lungs: Clear bilaterally to auscultation and percussion. Heart: HRRR . Normal S1 and S2 without gallops or murmurs.  Abdomen: Bowel sounds are positive, abdomen soft and non-tender  Msk:  Back normal, normal gait. Normal strength and tone for age. Extremities: No clubbing, cyanosis or edema.   Neuro: Alert and oriented X 3. Psych:  Good affect, responds appropriately   LABS: Basic Metabolic Panel: Recent Labs    09/09/19 0956 09/10/19 1145  NA 136 138  K 5.0 5.3*  CL 97* 100  CO2 28 28  GLUCOSE 250* 173*  BUN 50* 57*  CREATININE 1.50* 1.38*  CALCIUM 8.4* 8.6*   Liver Function Tests: No results for input(s): AST, ALT, ALKPHOS, BILITOT, PROT, ALBUMIN in the last 72 hours. No results for input(s): LIPASE, AMYLASE in the last 72 hours. CBC: Recent Labs    09/09/19 0956 09/10/19 1145  WBC 9.7 7.9  HGB 9.5* 9.7*  HCT 32.4* 33.8*  MCV 67.6* 68.8*  PLT 370 366   Cardiac Enzymes: No results for input(s): CKTOTAL, CKMB, CKMBINDEX, TROPONINI in the last 72 hours. BNP: Invalid input(s): POCBNP D-Dimer: No results for input(s): DDIMER in the last 72 hours. Hemoglobin A1C: No results for input(s): HGBA1C in the last 72 hours. Fasting Lipid Panel: No results for input(s): CHOL, HDL,  LDLCALC, TRIG, CHOLHDL, LDLDIRECT in the last 72 hours. Thyroid Function Tests: No results for input(s): TSH, T4TOTAL, T3FREE, THYROIDAB in the last 72 hours.  Invalid input(s): FREET3 Anemia Panel: No results for input(s): VITAMINB12, FOLATE, FERRITIN, TIBC, IRON, RETICCTPCT in the last 72 hours.  No results found.   Echo LVEF 55 to 60%  TELEMETRY: Sinus rhythm:  ASSESSMENT AND PLAN:  Active Problems:   Acute CHF (congestive heart failure) (HCC)   Acute exacerbation of CHF (congestive heart failure) ([place])    1. Acute on chronic diastolic congestive heart failure, multiple ER visits for similar complaints, known normal left ventricular function, normal high-sensitivity troponin, BNP mildly elevated, clinically improved after IV furosemide and diuresis 2. Essential hypertension, blood pressure mildly elevated, likely due to medical noncompliance 3. COPD 4. Type 2 diabetes 5. Medical noncompliance, patient refusing telemetry and a.m. medications  Recommendations  1. Agree with current therapy 2. Continue diuresis 3. Carefully monitor renal status 4. Continue metoprolol tartrate for blood pressure control 5. Low sodium, no added salt diet 6. No further cardiac diagnostics at this time   Isaias Cowman, MD, PhD, [facility] 09/10/2019 1:55 PM

## 2019-09-11 LAB — BASIC METABOLIC PANEL
Anion gap: 8 (ref 5–15)
BUN: 56 mg/dL — ABNORMAL HIGH (ref 8–23)
CO2: 28 mmol/L (ref 22–32)
Calcium: 8.5 mg/dL — ABNORMAL LOW (ref 8.9–10.3)
Chloride: 102 mmol/L (ref 98–111)
Creatinine, Ser: 1.15 mg/dL — ABNORMAL HIGH (ref 0.44–1.00)
GFR calc Af Amer: 52 mL/min — ABNORMAL LOW (ref >60)
GFR calc non Af Amer: 45 mL/min — ABNORMAL LOW (ref >60)
Glucose, Bld: 252 mg/dL — ABNORMAL HIGH (ref 70–99)
Potassium: 5.6 mmol/L — ABNORMAL HIGH (ref 3.5–5.1)
Sodium: 138 mmol/L (ref 135–145)

## 2019-09-11 LAB — CBC
HCT: 34.5 % — ABNORMAL LOW (ref 36.0–46.0)
Hemoglobin: 10.1 g/dL — ABNORMAL LOW (ref 12.0–15.0)
MCH: 20 pg — ABNORMAL LOW (ref 26.0–34.0)
MCHC: 29.3 g/dL — ABNORMAL LOW (ref 30.0–36.0)
MCV: 68.3 fL — ABNORMAL LOW (ref 80.0–100.0)
Platelets: 340 10*3/uL (ref 150–400)
RBC: 5.05 MIL/uL (ref 3.87–5.11)
RDW: 20.7 % — ABNORMAL HIGH (ref 11.5–15.5)
WBC: 7.1 10*3/uL (ref 4.0–10.5)
nRBC: 0 % (ref 0.0–0.2)

## 2019-09-11 LAB — GLUCOSE, CAPILLARY
Glucose-Capillary: 240 mg/dL — ABNORMAL HIGH (ref 70–99)
Glucose-Capillary: 249 mg/dL — ABNORMAL HIGH (ref 70–99)
Glucose-Capillary: 317 mg/dL — ABNORMAL HIGH (ref 70–99)
Glucose-Capillary: 328 mg/dL — ABNORMAL HIGH (ref 70–99)

## 2019-09-11 MED ORDER — CALCIUM GLUCONATE-NACL 1-0.675 GM/50ML-% IV SOLN
1.0000 g | Freq: Once | INTRAVENOUS | Status: AC
  Administered 2019-09-11: 12:00:00 1000 mg via INTRAVENOUS
  Filled 2019-09-11: qty 50

## 2019-09-11 MED ORDER — SODIUM ZIRCONIUM CYCLOSILICATE 10 G PO PACK
10.0000 g | PACK | Freq: Once | ORAL | Status: AC
  Administered 2019-09-11: 10 g via ORAL
  Filled 2019-09-11: qty 1

## 2019-09-11 MED ORDER — FUROSEMIDE 40 MG PO TABS
40.0000 mg | ORAL_TABLET | Freq: Every day | ORAL | Status: DC
  Administered 2019-09-12: 11:00:00 40 mg via ORAL
  Filled 2019-09-11: qty 1

## 2019-09-11 MED ORDER — ZINC OXIDE 40 % EX OINT
TOPICAL_OINTMENT | Freq: Two times a day (BID) | CUTANEOUS | Status: DC
  Filled 2019-09-11: qty 113

## 2019-09-11 NOTE — Care Management Important Message (Signed)
Important Message  Patient Details  Name: Karla Sanchez MRN: [id number] Date of Birth: 09/24/1940   Medicare Important Message Given:  Yes     Juliann Pulse A Nalany Steedley 09/11/2019, 2:44 PM

## 2019-09-11 NOTE — Progress Notes (Signed)
[facility] Cardiology    SUBJECTIVE: The patient reports feeling better this morning, including her breathing. She denies chest pain. She states she would feel even better if she could sleep better.   Vitals:   09/10/19 1703 09/10/19 2000 09/10/19 2250 09/11/19 0734  BP: (!) 148/52 (!) 124/40 (!) 156/61 (!) 176/67  Pulse: 61 (!) 58 70   Resp: 18 18    Temp:  98 F (36.7 C) 97.7 F (36.5 C) 98.1 F (36.7 C)  TempSrc:   Oral Oral  SpO2: 100% 99% 97% 94%  Weight:      Height:         Intake/Output Summary (Last 24 hours) at 09/11/2019 0815 Last data filed at 09/10/2019 1840 Gross per 24 hour  Intake 840 ml  Output --  Net 840 ml      PHYSICAL EXAM  General: Elderly, obese female sitting up in recliner eating breakfast in no acute distress HEENT:  Normocephalic and atramatic Neck:  No JVD.  Lungs: normal effort of breathing on supplemental O2 via [place], diminished breath sounds throughout. No crackles or wheezing. Heart: HRRR . Normal S1 and S2 without gallops or murmurs.  Abdomen: nondistended Msk:  Back normal, gait not assessed. Extremities: Trace bilateral lower extremity edema.   Neuro: Alert and oriented X 3. Psych:  Good affect, responds appropriately   LABS: Basic Metabolic Panel: Recent Labs    09/10/19 1145 09/11/19 0429  NA 138 138  K 5.3* 5.6*  CL 100 102  CO2 28 28  GLUCOSE 173* 252*  BUN 57* 56*  CREATININE 1.38* 1.15*  CALCIUM 8.6* 8.5*   Liver Function Tests: No results for input(s): AST, ALT, ALKPHOS, BILITOT, PROT, ALBUMIN in the last 72 hours. No results for input(s): LIPASE, AMYLASE in the last 72 hours. CBC: Recent Labs    09/10/19 1145 09/11/19 0429  WBC 7.9 7.1  HGB 9.7* 10.1*  HCT 33.8* 34.5*  MCV 68.8* 68.3*  PLT 366 340   Cardiac Enzymes: No results for input(s): CKTOTAL, CKMB, CKMBINDEX, TROPONINI in the last 72 hours. BNP: Invalid input(s): POCBNP D-Dimer: No results for input(s): DDIMER in the last 72 hours. Hemoglobin  A1C: No results for input(s): HGBA1C in the last 72 hours. Fasting Lipid Panel: No results for input(s): CHOL, HDL, LDLCALC, TRIG, CHOLHDL, LDLDIRECT in the last 72 hours. Thyroid Function Tests: No results for input(s): TSH, T4TOTAL, T3FREE, THYROIDAB in the last 72 hours.  Invalid input(s): FREET3 Anemia Panel: No results for input(s): VITAMINB12, FOLATE, FERRITIN, TIBC, IRON, RETICCTPCT in the last 72 hours.  No results found.   Echo LVEF 55-60%  TELEMETRY: sinus rhythm, 70s  ASSESSMENT AND PLAN:  Active Problems:   Acute CHF (congestive heart failure) (HCC)   Acute exacerbation of CHF (congestive heart failure) ([place])    1. Acute on chronic diastolic congestive heart failure, multiple ER visits for similar complaints, known normal left ventricular function, normal high-sensitivity troponin, BNP mildly elevated, clinically improved after IV furosemide and diuresis 2. Essential hypertension, blood pressure mildly elevated 3. COPD 4. Type 2 diabetes 5. Medical noncompliance, from medication noncompliance. 6. Hyperkalemia, 5.6 this morning  Recommendations: 1. Recommend transitioning to PO Lasix  2. Continue metoprolol tartrate 50 mg BID 3. Strongly recommend low sodium diet and medication compliance 4. Follow-up with Dr. Saralyn Pilar as outpatient in 1 week from discharge 5. No further cardiac diagnostics recommended at this time; sign off for now; please call/haiku with any questions.   Clabe Seal, PA-C 09/11/2019 8:15 AM  Discussed with Dr. Saralyn Pilar; plan made in collaboration with him.

## 2019-09-11 NOTE — Progress Notes (Addendum)
PROGRESS NOTE    Karla Sanchez  NUU:[id number] DOB: [birth year] DOA: 09/07/2019 PCP: Alfredia Client, MD   Assessment & Plan:   Active Problems:   Acute CHF (congestive heart failure) (HCC)   Acute exacerbation of CHF (congestive heart failure) ([place])  Noncompliance: intermittently pt refuses to takes meds  Acute on chronic diastolic CHF: improving. Continue on po lasix, metoprolol. Monitor I/Os and daily weights. Cardio following and recs apprec  Hyperkalemia: ca gluconate, lokelma ordered. Will continue to monitor   COPD exacerbation: continue on duonebs, IV steroids. Encourage incentive spirometry   Chronic hypoxic respiratory failure: continue on supplemental oxygen. Likely secondary to COPD  DM2: will continue to hold metformin. 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 Will continue to monitor   AKI: baseline Cr is unknown. Cr is trending down today. Will continue to monitor   Gout: will continue allopurinol and colchicine.  Essential hypertension: will continue on metoprolol   Chronic RLE swelling: [place] is neg for DVT. Likely secondary to CHF   Generalized weakness: PT/OT recs SNF  DVT prophylaxis: lovenox Code Status: DNR Family Communication:  Disposition Plan:  D/c to SNF. Barriers includes pt not taking any medications to improve above stated problems   Status is: Inpatient  Remains inpatient appropriate because:IV treatments appropriate due to intensity of illness or inability to take PO   Dispo: The patient is from: Home              Anticipated d/c is to: SNF              Anticipated d/c date is: 1 day              Patient currently medically stable for d/c. Awaiting for SNF placement to be set up       Consultants:   Cardio    Procedures:   Antimicrobials:    Subjective: Pt c/o fatigue  Objective: Vitals:   09/10/19 1143 09/10/19 1703 09/10/19 2000 09/10/19 2250  BP: (!)  163/61 (!) 148/52 (!) 124/40 (!) 156/61  Pulse: 70 61 (!) 58 70  Resp: 19 18 18    Temp:   98 F (36.7 C) 97.7 F (36.5 C)  TempSrc:    Oral  SpO2: 93% 100% 99% 97%  Weight:      Height:        Intake/Output Summary (Last 24 hours) at 09/11/2019 0718 Last data filed at 09/10/2019 1840 Gross per 24 hour  Intake 840 ml  Output --  Net 840 ml   Filed Weights   09/06/19 1820 09/07/19 2208  Weight: 99.8 kg 111.9 kg    Examination:  General exam: Appears calm and comfortable. Morbidly obese  Respiratory system: diminished breath sounds b/l. No wheezes Cardiovascular system: S1 & S2 +. No rubs, gallops or clicks. RLE edema  Gastrointestinal system: Abdomen is obese, soft and nontender.  Normal bowel sounds heard. Central nervous system: Alert and oriented. Moves all 4 extremities  Psychiatry: Judgement and insight appear normal. Normal mood and affect     Data Reviewed: I have personally reviewed following labs and imaging studies  CBC: Recent Labs  Lab 09/06/19 1840 09/08/19 0805 09/09/19 0956 09/10/19 1145 09/11/19 0429  WBC 8.1 7.7 9.7 7.9 7.1  HGB 10.5* 10.4* 9.5* 9.7* 10.1*  HCT 37.2 36.4 32.4* 33.8* 34.5*  MCV 69.9* 69.1* 67.6* 68.8* 68.3*  PLT 364 362 370 366 347   Basic Metabolic Panel: Recent Labs  Lab  09/06/19 1840 09/08/19 0805 09/09/19 0956 09/10/19 1145 09/11/19 0429  NA 139 141 136 138 138  K 5.0 5.0 5.0 5.3* 5.6*  CL 103 102 97* 100 102  CO2 28 29 28 28 28   GLUCOSE 104* 96 250* 173* 252*  BUN 30* 34* 50* 57* 56*  CREATININE 1.12* 1.25* 1.50* 1.38* 1.15*  CALCIUM 8.3* 8.4* 8.4* 8.6* 8.5*   GFR: Estimated Creatinine Clearance: 50.3 mL/min (A) (by C-G formula based on SCr of 1.15 mg/dL (H)). Liver Function Tests: No results for input(s): AST, ALT, ALKPHOS, BILITOT, PROT, ALBUMIN in the last 168 hours. No results for input(s): LIPASE, AMYLASE in the last 168 hours. No results for input(s): AMMONIA in the last 168 hours. Coagulation  Profile: No results for input(s): INR, PROTIME in the last 168 hours. Cardiac Enzymes: No results for input(s): CKTOTAL, CKMB, CKMBINDEX, TROPONINI in the last 168 hours. BNP (last 3 results) No results for input(s): PROBNP in the last 8760 hours. HbA1C: No results for input(s): HGBA1C in the last 72 hours. CBG: Recent Labs  Lab 09/09/19 1152 09/10/19 0836 09/10/19 1143 09/10/19 1701 09/10/19 2242  GLUCAP 186* 123* 155* 136* 126*   Lipid Profile: No results for input(s): CHOL, HDL, LDLCALC, TRIG, CHOLHDL, LDLDIRECT in the last 72 hours. Thyroid Function Tests: No results for input(s): TSH, T4TOTAL, FREET4, T3FREE, THYROIDAB in the last 72 hours. Anemia Panel: No results for input(s): VITAMINB12, FOLATE, FERRITIN, TIBC, IRON, RETICCTPCT in the last 72 hours. Sepsis Labs: No results for input(s): PROCALCITON, LATICACIDVEN in the last 168 hours.  Recent Results (from the past 240 hour(s))  SARS Coronavirus 2 by RT PCR (hospital order, performed in [facility] hospital lab) Nasopharyngeal Nasopharyngeal Swab     Status: None   Collection Time: 09/07/19  2:47 AM   Specimen: Nasopharyngeal Swab  Result Value Ref Range Status   SARS Coronavirus 2 NEGATIVE NEGATIVE Final    Comment: (NOTE) SARS-CoV-2 target nucleic acids are NOT DETECTED.  The SARS-CoV-2 RNA is generally detectable in upper and lower respiratory specimens during the acute phase of infection. The lowest concentration of SARS-CoV-2 viral copies this assay can detect is 250 copies / mL. A negative result does not preclude SARS-CoV-2 infection and should not be used as the sole basis for treatment or other patient management decisions.  A negative result may occur with improper specimen collection / handling, submission of specimen other than nasopharyngeal swab, presence of viral mutation(s) within the areas targeted by this assay, and inadequate number of viral copies (<250 copies / mL). A negative result must be  combined with clinical observations, patient history, and epidemiological information.  Fact Sheet for Patients:   StrictlyIdeas.no  Fact Sheet for Healthcare Providers: BankingDealers.co.za  This test is not yet approved or  cleared by the [place] FDA and has been authorized for detection and/or diagnosis of SARS-CoV-2 by FDA under an Emergency Use Authorization (EUA).  This EUA will remain in effect (meaning this test can be used) for the duration of the COVID-19 declaration under Section 564(b)(1) of the Act, 21 U.S.C. section 360bbb-3(b)(1), unless the authorization is terminated or revoked sooner.  Performed at [facility], [address], [place], [place] [address]          Radiology Studies: No results found.      Scheduled Meds: . allopurinol  100 mg Oral Daily  . colchicine  0.6 mg Oral Daily  . enoxaparin (LOVENOX) injection  40 mg Subcutaneous Q24H  . ferrous sulfate  325 mg Oral BID WC  . furosemide  40 mg Intravenous Q12H  . Gerhardt's butt cream   Topical QID  . hydrocortisone cream   Topical BID  . insulin aspart  0-9 Units Subcutaneous QID  . insulin glargine  15 Units Subcutaneous QHS  . loratadine  10 mg Oral Daily  . melatonin  5 mg Oral QHS  . methylPREDNISolone (SOLU-MEDROL) injection  40 mg Intravenous Q12H  . metoprolol tartrate  50 mg Oral BID  . sodium chloride flush  3 mL Intravenous Q12H  . sodium zirconium cyclosilicate  10 g Oral Once   Continuous Infusions: . sodium chloride    . calcium gluconate       LOS: 3 days    Time spent: 31 mins     Wyvonnia Dusky, MD [facility] Pager [phone]  If 7PM-7AM, please contact night-coverage [email or web address] 09/11/2019, 7:18 AM

## 2019-09-11 NOTE — Plan of Care (Signed)
  Problem: Health Behavior/Discharge Planning: Goal: Ability to manage health-related needs will improve Outcome: Progressing   Problem: Clinical Measurements: Goal: Ability to maintain clinical measurements within normal limits will improve Outcome: Progressing Goal: Will remain free from infection Outcome: Progressing Goal: Respiratory complications will improve Outcome: Progressing Goal: Cardiovascular complication will be avoided Outcome: Progressing   Problem: Elimination: Goal: Will not experience complications related to bowel motility Outcome: Progressing Goal: Will not experience complications related to urinary retention Outcome: Progressing

## 2019-09-11 NOTE — Progress Notes (Signed)
PT Cancellation Note  Patient Details Name: Karla Sanchez MRN: [id number] DOB: 26-Oct-1940   Cancelled Treatment:    Reason Eval/Treat Not Completed: Medical issues which prohibited therapy   Chart reviewed.  Potassium 5.6 this am.  Will hold per therapy protocols and continue as appropriate.   Chesley Noon 09/11/2019, 12:20 PM

## 2019-09-11 NOTE — TOC Progression Note (Signed)
Transition of Care [facility]) - Progression Note    Patient Details  Name: Karla Sanchez MRN: [id number] Date of Birth: [birth year]  Transition of Care [facility] CM/SW [place], RN Phone Number: 09/11/2019, 9:38 AM  Clinical Narrative:     Met with the patient to discuss Bed offers in detail, she is familiar with [facility] and accepted their Bed offer, I notified Tammy with [facility] of the acceptance, the patient has no vaccine card and does not know if she got the vaccine or not,  Expected Discharge Plan: [place] Barriers to Discharge: Continued Medical Work up  Expected Discharge Plan and Services Expected Discharge Plan: [place] arrangements for the past 2 months: Hotel/Motel (Lives in [place])                                       Social Determinants of Health (SDOH) Interventions    Readmission Risk Interventions No flowsheet data found.

## 2019-09-12 DIAGNOSIS — I5031 Acute diastolic (congestive) heart failure: Secondary | ICD-10-CM

## 2019-09-12 LAB — CBC
HCT: 37.1 % (ref 36.0–46.0)
Hemoglobin: 10.5 g/dL — ABNORMAL LOW (ref 12.0–15.0)
MCH: 19.8 pg — ABNORMAL LOW (ref 26.0–34.0)
MCHC: 28.3 g/dL — ABNORMAL LOW (ref 30.0–36.0)
MCV: 70.1 fL — ABNORMAL LOW (ref 80.0–100.0)
Platelets: 351 10*3/uL (ref 150–400)
RBC: 5.29 MIL/uL — ABNORMAL HIGH (ref 3.87–5.11)
RDW: 21.2 % — ABNORMAL HIGH (ref 11.5–15.5)
WBC: 8.6 10*3/uL (ref 4.0–10.5)
nRBC: 0 % (ref 0.0–0.2)

## 2019-09-12 LAB — BASIC METABOLIC PANEL
Anion gap: 7 (ref 5–15)
BUN: 54 mg/dL — ABNORMAL HIGH (ref 8–23)
CO2: 32 mmol/L (ref 22–32)
Calcium: 8.9 mg/dL (ref 8.9–10.3)
Chloride: 101 mmol/L (ref 98–111)
Creatinine, Ser: 1.21 mg/dL — ABNORMAL HIGH (ref 0.44–1.00)
GFR calc Af Amer: 49 mL/min — ABNORMAL LOW (ref >60)
GFR calc non Af Amer: 43 mL/min — ABNORMAL LOW (ref >60)
Glucose, Bld: 210 mg/dL — ABNORMAL HIGH (ref 70–99)
Potassium: 5 mmol/L (ref 3.5–5.1)
Sodium: 140 mmol/L (ref 135–145)

## 2019-09-12 LAB — GLUCOSE, CAPILLARY: Glucose-Capillary: 212 mg/dL — ABNORMAL HIGH (ref 70–99)

## 2019-09-12 LAB — SARS CORONAVIRUS 2 (TAT 6-24 HRS): SARS Coronavirus 2: NEGATIVE

## 2019-09-12 MED ORDER — FERROUS SULFATE 325 (65 FE) MG PO TABS: 325.0000 mg | ORAL_TABLET | Freq: Two times a day (BID) | ORAL | 0 refills | Status: DC

## 2019-09-12 MED ORDER — PREDNISONE 20 MG PO TABS: 40.0000 mg | ORAL_TABLET | Freq: Every day | ORAL | 0 refills | Status: AC

## 2019-09-12 NOTE — TOC Transition Note (Signed)
Transition of Care [facility]) - CM/SW Discharge Note   Patient Details  Name: Karla Sanchez MRN: [id number] Date of Birth: 1940-06-20  Transition of Care [facility]) CM/SW Contact:  Shelbie Ammons, RN Phone Number: 09/12/2019, 1:52 PM   Clinical Narrative:   Patient being discharged to [facility] room 719, bedside nurse to call report to [phone]. EMS paperwork completed and on chart.     Final next level of care: Skilled Nursing Facility Barriers to Discharge: Continued Medical Work up   Patient Goals and CMS Choice Patient states their goals for this hospitalization and ongoing recovery are:: Get placed into a SNF and eventual LTC placement      Discharge Placement              Patient chooses bed at: [facility] [place] Patient to be transferred to facility by: [facility]   Patient and family notified of of transfer: 09/12/19  Discharge Plan and Services                                     Social Determinants of Health (SDOH) Interventions     Readmission Risk Interventions No flowsheet data found.

## 2019-09-12 NOTE — Discharge Summary (Signed)
Physician Discharge Summary  [name] QPY:[id number] DOB: Jan 28, 1941 DOA: 09/07/2019  PCP: Alfredia Client, MD  Admit date: 09/07/2019 Discharge date: 09/12/2019  Admitted From: home  Disposition:  SNF  Recommendations for Outpatient Follow-up:  1. Follow up with PCP in 1-2 weeks 2. F/u cardio, Dr. Saralyn Pilar, in 1 week   Home Health: Equipment/Devices:  Discharge Condition: stable  CODE STATUS: full  Diet recommendation: Heart Healthy / Carb Modified  Brief/Interim Summary: HPI was taken from Dr. Sidney Ace: Karla Sanchez  is a 79 y.o. female with a known history of diastolic heart failure, COPD, type 2 diabetes mellitus and hypertension, presented to the emergency room with worsening dyspnea for the last several days without significant cough or wheezing.  She has been having dyspnea on exertion and worsening lower extremity edema.  She denied any fever or chills.  No nausea or vomiting or abdominal pain.  She denied chest pain or palpitations.  Upon presentation to the emergency room vital signs were normal pulse 70 of 98% on 3 L of O2 by nasal cannula.  The patient is usually on 2 L of O2 at home.  Blood pressure later on was 171/84 respiratory to 22.  Labs revealed BNP was 171.6 and troponin I was 13.  CBC showed anemia better than previous levels..  EKG showed normal sinus rhythm with a rate of 76 with right bundle branch block and T wave inversion inferiorly.  For which x-ray showed mild interstitial pulmonary edema and pulmonary arterial hypertension as well as atherosclerosis.  Most recent 2D echo showed an EF of 55 to 60% with LVH, grade 2 diastolic dysfunction moderate LAD and mild to moderate RAD with mild mitral regurgitation and dilated IVC.  The patient was given 40 mg of IV Lasix and DuoNeb's.  She will be admitted to an observation progressive cardiac bed for further evaluation and management  Hospital course from Dr. Lenna Sciara. [name] 6/17-6/22/21: Pt presented  w/ shortness of breath secondary acute on chronic diastolic CHF exacerbation. Pt was diuresed w/ IV lasix initially and then transitioned back to po lasix at d/c. Furthermore, pt was also treated for COPD exacerbation w/ bronchodilators, IV steroids &  incentive spirometry. Pt responded relatively well to the treatment. Of note, pt was found to have IDA and was started on iron supplements. PT/OT saw the pt and recommended SNF.   Discharge Diagnoses:  Active Problems:   Acute CHF (congestive heart failure) (HCC)   Acute exacerbation of CHF (congestive heart failure) ([place])  Noncompliance: intermittently pt refuses to takes meds  Acute on chronic diastolic CHF: improving. Continue on po lasix, metoprolol. Monitor I/Os and daily weights. Cardio following and recs apprec  Hyperkalemia: resolved  COPD exacerbation: continue on duonebs, po steroids x 5 days. Encourage incentive spirometry   Chronic hypoxic respiratory failure: continue on supplemental oxygen. Likely secondary to COPD  DM2: will continue to hold metformin. 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 Will continue to monitor   AKI: baseline Cr is unknown. Cr is trending down today. Will continue to monitor   Gout: will continue allopurinol and colchicine.  Essential hypertension: will continue on metoprolol   Chronic RLE swelling: [place] is neg for DVT. Likely secondary to CHF   Generalized weakness: PT/OT recs SNF  Discharge Instructions  Discharge Instructions    Diet - low sodium heart healthy   Complete by: As directed    Diet Carb Modified   Complete by: As directed  Discharge instructions   Complete by: As directed    F/u PCP in 1-2 weeks. F/u cardio, Dr. Saralyn Pilar, in 1 week   Increase activity slowly   Complete by: As directed      Allergies as of 09/12/2019      Reactions   Codeine Nausea Only, Other (See Comments), Swelling   Other Reaction: nausea and  vomiting Other reaction(s): NAUSEA   Other Other (See Comments)   Other reaction(s): SWELLING Other reaction(s): MUSCLE PAIN Muscle aches Other reaction(s): MUSCLE PAIN Muscle aches Other reaction(s): MUSCLE PAIN Muscle aches   Exenatide Nausea And Vomiting   Levofloxacin Other (See Comments)   Other Reaction: sloughing of buccal mucosa Other reaction(s): OTHER Lost skin in mouth Patient states she can take Cipro without difficulty and has taken it many times in the past Other Reaction: sloughing of buccal mucosa   Losartan Other (See Comments)   Weight gain Weight gain   Tetracyclines & Related Other (See Comments)      Medication List    TAKE these medications   albuterol 108 (90 Base) MCG/ACT inhaler Commonly known as: VENTOLIN HFA Inhale 2 puffs into the lungs every 6 (six) hours as needed for wheezing or shortness of breath.   allopurinol 100 MG tablet Commonly known as: ZYLOPRIM Take 1 tablet (100 mg total) by mouth daily.   colchicine 0.6 MG tablet Take 0.6 mg by mouth daily.   ferrous sulfate 325 (65 FE) MG tablet Take 1 tablet (325 mg total) by mouth 2 (two) times daily with a meal.   furosemide 40 MG tablet Commonly known as: Lasix Take 1 tablet (40 mg total) by mouth daily.   insulin glargine 100 UNIT/ML injection Commonly known as: LANTUS Inject 0.15 mLs (15 Units total) into the skin at bedtime.   loratadine 10 MG tablet Commonly known as: Claritin Take 1 tablet (10 mg total) by mouth daily.   Luer Lock Safety Syringes 25G X 5/8" 3 ML Misc Generic drug: SYRINGE-NEEDLE (DISP) 3 ML 15 Units by Does not apply route at bedtime.   metoprolol tartrate 50 MG tablet Commonly known as: LOPRESSOR Take 1 tablet (50 mg total) by mouth 2 (two) times daily.   predniSONE 20 MG tablet Commonly known as: Deltasone Take 2 tablets (40 mg total) by mouth daily for 5 days.       Contact information for follow-up providers    [place] Follow up on 09/26/2019.   Specialty: Cardiology Why: at 3:00pm. Enter through the [place] entrance Contact information: [place] [phone]           Contact information for after-discharge care    Destination    [facility] Preferred SNF .   Service: Skilled Nursing Contact information: [place] [phone]                 Allergies  Allergen Reactions   Codeine Nausea Only, Other (See Comments) and Swelling    Other Reaction: nausea and vomiting Other reaction(s): NAUSEA    Other Other (See Comments)    Other reaction(s): SWELLING  Other reaction(s): MUSCLE PAIN Muscle aches Other reaction(s): MUSCLE PAIN Muscle aches Other reaction(s): MUSCLE PAIN Muscle aches    Exenatide Nausea And Vomiting   Levofloxacin Other (See Comments)    Other Reaction: sloughing of buccal mucosa Other reaction(s): OTHER Lost skin in mouth Patient states she  can take Cipro without difficulty and has taken it many times in the past Other Reaction: sloughing of buccal mucosa    Losartan Other (See Comments)    Weight gain Weight gain    Tetracyclines & Related Other (See Comments)    Consultations:  Cardio, Dr. Saralyn Pilar   Procedures/Studies: DG Chest 1 View  Result Date: 09/06/2019 CLINICAL DATA:  Shortness of breath and right leg swelling EXAM: CHEST  1 VIEW COMPARISON:  Radiograph 08/14/2019 FINDINGS: Hazy interstitial opacities are present within the lungs with some peripheral septal thickening and mild central vascular congestion. No focal consolidation. No pneumothorax or visible effusion. Prominent central pulmonary arteries may reflect some pulmonary artery hypertension, similar to comparison is. The aorta is calcified. The remaining cardiomediastinal contours are unremarkable. No acute osseous or soft tissue abnormality.  Degenerative changes are present in the imaged spine and shoulders. Nasal cannula overlies the upper chest. IMPRESSION: Some hazy interstitial opacities with vascular congestion could reflect mild interstitial edema. Aortic Atherosclerosis (ICD10-I70.0). Mild central pulmonary artery prominence could reflect chronic pulmonary artery hypertension given presence on multiple comparisons. Electronically Signed   By: Lovena Le M.D.   On: 09/06/2019 19:10   US Venous Img Lower Unilateral Right (DVT)  Result Date: 09/07/2019 CLINICAL DATA:  Lower extremity swelling. EXAM: RIGHT LOWER EXTREMITY VENOUS DOPPLER ULTRASOUND TECHNIQUE: Gray-scale sonography with compression, as well as color and duplex ultrasound, were performed to evaluate the deep venous system(s) from the level of the common femoral vein through the popliteal and proximal calf veins. COMPARISON:  07/09/2019. FINDINGS: VENOUS Normal compressibility of the common femoral, superficial femoral, and popliteal veins, as well as the visualized calf veins. Limited visualization of the peroneal veins. Visualized portions of profunda femoral vein and great saphenous vein unremarkable. No filling defects to suggest DVT on grayscale or color Doppler imaging. Doppler waveforms show normal direction of venous flow, normal respiratory plasticity and response to augmentation. Limited views of the contralateral common femoral vein are unremarkable. OTHER None. IMPRESSION: Negative exam.  No evidence of right lower extremity DVT. Electronically Signed   By: Marcello Moores  Register   On: 09/07/2019 12:14   DG Chest Portable 1 View  Result Date: 08/14/2019 CLINICAL DATA:  Shortness of breath EXAM: PORTABLE CHEST 1 VIEW COMPARISON:  07/30/2019 FINDINGS: The heart size and mediastinal contours are within normal limits. Both lungs are clear. The visualized skeletal structures are unremarkable. IMPRESSION: No active disease. Electronically Signed   By: Ulyses Jarred M.D.   On:  08/14/2019 23:34      Subjective: Pt c/o insomnia   Discharge Exam: Vitals:   09/12/19 0418 09/12/19 1124  BP: (!) 147/58 (!) 146/50  Pulse: (!) 59 71  Resp: 20 20  Temp: (!) 97.5 F (36.4 C) 98 F (36.7 C)  SpO2: 97% 97%   Vitals:   09/11/19 1924 09/11/19 2346 09/12/19 0418 09/12/19 1124  BP: (!) 178/62 (!) 167/39 (!) 147/58 (!) 146/50  Pulse: 71 69 (!) 59 71  Resp: 16 20 20 20   Temp: (!) 97.4 F (36.3 C) 97.9 F (36.6 C) (!) 97.5 F (36.4 C) 98 F (36.7 C)  TempSrc:   Oral Oral  SpO2: 96% 94% 97% 97%  Weight:      Height:        General: Pt is alert, awake, not in acute distress Cardiovascular:  S1/S2 +, no rubs, no gallops Respiratory: diminished breath sounds b/l. No rales Abdominal: Soft, NT, obese, bowel sounds + Extremities: chronic RLE edema, no  cyanosis    The results of significant diagnostics from this hospitalization (including imaging, microbiology, ancillary and laboratory) are listed below for reference.     Microbiology: Recent Results (from the past 240 hour(s))  SARS Coronavirus 2 by RT PCR (hospital order, performed in [facility] hospital lab) Nasopharyngeal Nasopharyngeal Swab     Status: None   Collection Time: 09/07/19  2:47 AM   Specimen: Nasopharyngeal Swab  Result Value Ref Range Status   SARS Coronavirus 2 NEGATIVE NEGATIVE Final    Comment: (NOTE) SARS-CoV-2 target nucleic acids are NOT DETECTED.  The SARS-CoV-2 RNA is generally detectable in upper and lower respiratory specimens during the acute phase of infection. The lowest concentration of SARS-CoV-2 viral copies this assay can detect is 250 copies / mL. A negative result does not preclude SARS-CoV-2 infection and should not be used as the sole basis for treatment or other patient management decisions.  A negative result may occur with improper specimen collection / handling, submission of specimen other than nasopharyngeal swab, presence of viral mutation(s) within  the areas targeted by this assay, and inadequate number of viral copies (<250 copies / mL). A negative result must be combined with clinical observations, patient history, and epidemiological information.  Fact Sheet for Patients:   StrictlyIdeas.no  Fact Sheet for Healthcare Providers: BankingDealers.co.za  This test is not yet approved or  cleared by the [place] FDA and has been authorized for detection and/or diagnosis of SARS-CoV-2 by FDA under an Emergency Use Authorization (EUA).  This EUA will remain in effect (meaning this test can be used) for the duration of the COVID-19 declaration under Section 564(b)(1) of the Act, 21 U.S.C. section 360bbb-3(b)(1), unless the authorization is terminated or revoked sooner.  Performed at [facility], [place], [place] [address]   SARS CORONAVIRUS 2 (TAT 6-24 HRS) Nasopharyngeal Nasopharyngeal Swab     Status: None   Collection Time: 09/11/19  1:41 PM   Specimen: Nasopharyngeal Swab  Result Value Ref Range Status   SARS Coronavirus 2 NEGATIVE NEGATIVE Final    Comment: (NOTE) SARS-CoV-2 target nucleic acids are NOT DETECTED.  The SARS-CoV-2 RNA is generally detectable in upper and lower respiratory specimens during the acute phase of infection. Negative results do not preclude SARS-CoV-2 infection, do not rule out co-infections with other pathogens, and should not be used as the sole basis for treatment or other patient management decisions. Negative results must be combined with clinical observations, patient history, and epidemiological information. The expected result is Negative.  Fact Sheet for Patients: SugarRoll.be  Fact Sheet for Healthcare Providers: https://www.woods-mathews.com/  This test is not yet approved or cleared by the [place] FDA and  has been authorized for detection and/or diagnosis  of SARS-CoV-2 by FDA under an Emergency Use Authorization (EUA). This EUA will remain  in effect (meaning this test can be used) for the duration of the COVID-19 declaration under Se ction 564(b)(1) of the Act, 21 U.S.C. section 360bbb-3(b)(1), unless the authorization is terminated or revoked sooner.  Performed at [facility], [place] [address], [place],  [address]      Labs: BNP (last 3 results) Recent Labs    07/30/19 1856 08/14/19 2327 09/06/19 1840  BNP 161.0* 112.7* 098.1*   Basic Metabolic Panel: Recent Labs  Lab 09/08/19 0805 09/09/19 0956 09/10/19 1145 09/11/19 0429 09/12/19 1031  NA 141 136 138 138 140  K 5.0 5.0 5.3* 5.6* 5.0  CL 102 97* 100 102  101  CO2 29 28 28 28  32  GLUCOSE 96 250* 173* 252* 210*  BUN 34* 50* 57* 56* 54*  CREATININE 1.25* 1.50* 1.38* 1.15* 1.21*  CALCIUM 8.4* 8.4* 8.6* 8.5* 8.9   Liver Function Tests: No results for input(s): AST, ALT, ALKPHOS, BILITOT, PROT, ALBUMIN in the last 168 hours. No results for input(s): LIPASE, AMYLASE in the last 168 hours. No results for input(s): AMMONIA in the last 168 hours. CBC: Recent Labs  Lab 09/08/19 0805 09/09/19 0956 09/10/19 1145 09/11/19 0429 09/12/19 1031  WBC 7.7 9.7 7.9 7.1 8.6  HGB 10.4* 9.5* 9.7* 10.1* 10.5*  HCT 36.4 32.4* 33.8* 34.5* 37.1  MCV 69.1* 67.6* 68.8* 68.3* 70.1*  PLT 362 370 366 340 351   Cardiac Enzymes: No results for input(s): CKTOTAL, CKMB, CKMBINDEX, TROPONINI in the last 168 hours. BNP: Invalid input(s): POCBNP CBG: Recent Labs  Lab 09/11/19 0728 09/11/19 1132 09/11/19 1749 09/11/19 2118 09/12/19 1213  GLUCAP 240* 249* 317* 328* 212*   D-Dimer No results for input(s): DDIMER in the last 72 hours. Hgb A1c No results for input(s): HGBA1C in the last 72 hours. Lipid Profile No results for input(s): CHOL, HDL, LDLCALC, TRIG, CHOLHDL, LDLDIRECT in the last 72 hours. Thyroid function studies No results for input(s): TSH, T4TOTAL, T3FREE,  THYROIDAB in the last 72 hours.  Invalid input(s): FREET3 Anemia work up No results for input(s): VITAMINB12, FOLATE, FERRITIN, TIBC, IRON, RETICCTPCT in the last 72 hours. Urinalysis    Component Value Date/Time   COLORURINE STRAW (A) 07/30/2019 2204   APPEARANCEUR CLEAR (A) 07/30/2019 2204   LABSPEC 1.009 07/30/2019 2204   PHURINE 5.0 07/30/2019 [place] 07/30/2019 2204   HGBUR NEGATIVE 07/30/2019 2204   BILIRUBINUR NEGATIVE 07/30/2019 2204   KETONESUR NEGATIVE 07/30/2019 2204   PROTEINUR 100 (A) 07/30/2019 2204   NITRITE NEGATIVE 07/30/2019 2204   LEUKOCYTESUR NEGATIVE 07/30/2019 2204   Sepsis Labs Invalid input(s): PROCALCITONIN,  WBC,  LACTICIDVEN Microbiology Recent Results (from the past 240 hour(s))  SARS Coronavirus 2 by RT PCR (hospital order, performed in [facility] hospital lab) Nasopharyngeal Nasopharyngeal Swab     Status: None   Collection Time: 09/07/19  2:47 AM   Specimen: Nasopharyngeal Swab  Result Value Ref Range Status   SARS Coronavirus 2 NEGATIVE NEGATIVE Final    Comment: (NOTE) SARS-CoV-2 target nucleic acids are NOT DETECTED.  The SARS-CoV-2 RNA is generally detectable in upper and lower respiratory specimens during the acute phase of infection. The lowest concentration of SARS-CoV-2 viral copies this assay can detect is 250 copies / mL. A negative result does not preclude SARS-CoV-2 infection and should not be used as the sole basis for treatment or other patient management decisions.  A negative result may occur with improper specimen collection / handling, submission of specimen other than nasopharyngeal swab, presence of viral mutation(s) within the areas targeted by this assay, and inadequate number of viral copies (<250 copies / mL). A negative result must be combined with clinical observations, patient history, and epidemiological information.  Fact Sheet for Patients:   StrictlyIdeas.no  Fact  Sheet for Healthcare Providers: BankingDealers.co.za  This test is not yet approved or  cleared by the [place] FDA and has been authorized for detection and/or diagnosis of SARS-CoV-2 by FDA under an Emergency Use Authorization (EUA).  This EUA will remain in effect (meaning this test can be used) for the duration of the COVID-19 declaration under Section 564(b)(1) of the Act, 21 U.S.C. section  360bbb-3(b)(1), unless the authorization is terminated or revoked sooner.  Performed at [facility], [place], [place] [address]   SARS CORONAVIRUS 2 (TAT 6-24 HRS) Nasopharyngeal Nasopharyngeal Swab     Status: None   Collection Time: 09/11/19  1:41 PM   Specimen: Nasopharyngeal Swab  Result Value Ref Range Status   SARS Coronavirus 2 NEGATIVE NEGATIVE Final    Comment: (NOTE) SARS-CoV-2 target nucleic acids are NOT DETECTED.  The SARS-CoV-2 RNA is generally detectable in upper and lower respiratory specimens during the acute phase of infection. Negative results do not preclude SARS-CoV-2 infection, do not rule out co-infections with other pathogens, and should not be used as the sole basis for treatment or other patient management decisions. Negative results must be combined with clinical observations, patient history, and epidemiological information. The expected result is Negative.  Fact Sheet for Patients: SugarRoll.be  Fact Sheet for Healthcare Providers: https://www.woods-mathews.com/  This test is not yet approved or cleared by the [place] FDA and  has been authorized for detection and/or diagnosis of SARS-CoV-2 by FDA under an Emergency Use Authorization (EUA). This EUA will remain  in effect (meaning this test can be used) for the duration of the COVID-19 declaration under Se ction 564(b)(1) of the Act, 21 U.S.C. section 360bbb-3(b)(1), unless the authorization is terminated  or revoked sooner.  Performed at [facility], [place] [address], [place],  [address]      Time coordinating discharge: Over 30 minutes  SIGNED:   Wyvonnia Dusky, MD  [facility] 09/12/2019, 12:56 PM Pager   If 7PM-7AM, please contact night-coverage [email or web address]

## 2019-09-12 NOTE — Progress Notes (Signed)
Physical Therapy Treatment Patient Details Name: Karla Sanchez MRN: [id number] DOB: 08-25-1940 Today's Date: 09/12/2019    History of Present Illness 79 y.o. female with a known history of diastolic heart failure, COPD, type 2 diabetes mellitus and hypertension, presented to the emergency room with worsening dyspnea for the last several days without significant cough or wheezing.  She has been having dyspnea on exertion and worsening lower extremity edema. Pt uses a WC at baseline. Performs 2-3 step MOD I transfers to/from [facility] and propels with feet.`    PT Comments    Potassium trending down 5.0 this am which is at upper limit for therapy interventions.  Pt in recliner and declines therapy upon arrival.  Session spent with education for seated ex and positioning - elevating LE's for edema control.  Pt very talkative and difficulty to educate at times as she talks about unrelated topics.     Follow Up Recommendations  SNF     Equipment Recommendations  None recommended by PT    Recommendations for Other Services       Precautions / Restrictions Precautions Precautions: Fall Precaution Comments: check Potassium Restrictions Weight Bearing Restrictions: No    Mobility  Bed Mobility               General bed mobility comments: in recliner on arrival, does not wish to get to bed  Transfers                 General transfer comment: deferred  Ambulation/Gait                 Stairs             Wheelchair Mobility    Modified Rankin (Stroke Patients Only)       Balance                                            Cognition Arousal/Alertness: Awake/alert Behavior During Therapy: WFL for tasks assessed/performed Overall Cognitive Status: Within Functional Limits for tasks assessed                                        Exercises Other Exercises Other Exercises: seated ankle pumps and LAQ x 10 Other  Exercises: education    General Comments        Pertinent Vitals/Pain Pain Assessment: Faces Faces Pain Scale: Hurts a little bit Pain Location: ankles Pain Descriptors / Indicators: Guarding;Grimacing;Sore Pain Intervention(s): Limited activity within patient's tolerance;Monitored during session    Home Living                      Prior Function            PT Goals (current goals can now be found in the care plan section) Progress towards PT goals: Progressing toward goals    Frequency    Min 2X/week      PT Plan Current plan remains appropriate    Co-evaluation              AM-PAC PT "6 Clicks" Mobility   Outcome Measure  Help needed turning from your back to your side while in a flat bed without using bedrails?: A Little Help needed moving from lying on your back to sitting on  the side of a flat bed without using bedrails?: A Little Help needed moving to and from a bed to a chair (including a wheelchair)?: A Little Help needed standing up from a chair using your arms (e.g., wheelchair or bedside chair)?: A Little Help needed to walk in hospital room?: Total Help needed climbing 3-5 steps with a railing? : Total 6 Click Score: 14    End of Session   Activity Tolerance: Patient tolerated treatment well Patient left: with call bell/phone within reach;with chair alarm set Nurse Communication: Mobility status       Time: 1145-1153 PT Time Calculation (min) (ACUTE ONLY): 8 min  Charges:  $Therapeutic Exercise: 8-22 mins                    Chesley Noon, PTA 09/12/19, 12:53 PM

## 2019-09-14 ENCOUNTER — Telehealth: Payer: Self-pay | Admitting: Family

## 2019-09-14 NOTE — Telephone Encounter (Signed)
Unable to reach patient regarding her new patient CHF Clinic appointment for 7/6   Karla Sanchez, [place]

## 2019-09-18 ENCOUNTER — Telehealth (HOSPITAL_COMMUNITY): Payer: Self-pay

## 2019-09-18 NOTE — Telephone Encounter (Signed)
Contacted Karla Sanchez due to HF clinic had tried to contact her and was unable.  She was discharged from [facility].  This morning Karla Sanchez stated she left there yesterday and went back to the hotel.  She states they were not helping her.  She states she is doing ok and has her medications. She states she will go to her HF clinic appt and advised her they have scheduled her transportation that will pick her up at the hotel.  To be outside waiting about an hour before the appt.  She was pleased and appreciative.  She states unsure what she is going to do now, she states has a call in to the Education officer, museum and wating for her call.  I also contacted Karla Sanchez with Social services at [phone] and advised her same.  She states she thought [facility] was still at [facility], she also stated she will go by to visit her one day this week.  Will continue to visit for heart failure and assist in any way I may help.   [place] [phone]

## 2019-09-24 NOTE — Progress Notes (Deleted)
   Patient ID: Karla Sanchez, female    DOB: 1940/05/24, 79 y.o.   MRN: [id number]  HPI  Karla Sanchez is a 79 y/o female with a history of  Echo report from 07/11/19 reviewed and showed an EF of 55-60% along with moderate LVH, moderately elevated PA Pressure and mild MR.   Admitted 09/07/19 due to acute on chronic HF. Cardiology consult obtained. Initially given IV lasix and then transitioned to oral diuretics. Oxygen need was increased to 3L and then weaned back to home O2 of 2L. Given bronchodilators and IV steroids for COPD exacerbation. Started on iron supplements due to anemia. Intermittently refused to take medications. PT evaluation done. Discharged after 5 days to SNF. Was in the ED 08/14/19 due to COPD exacerbation where she was treated and released. Was in the ED 07/30/19 due to HF exacerbation where she was treated and released.    She presents today for her initial visit with a chief complaint of  Review of Systems    Physical Exam  Assessment & Plan:  1: 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- - NYHA class - BNP 09/06/19 was 171.6  2: HTN- - BP - saw PCP (Eranti) at [facility] 04/13/19 - BMP 09/12/19 reviewed and showed sodium 140, potassium 5.0, creatinine 1.21 and GFR 43  3: DM- - A1c 07/09/19 was 7.3%  4: COPD-

## 2019-09-26 ENCOUNTER — Ambulatory Visit: Payer: Federal, State, Local not specified - PPO | Admitting: Family

## 2019-09-27 ENCOUNTER — Other Ambulatory Visit (HOSPITAL_COMMUNITY): Payer: Self-pay

## 2019-09-27 ENCOUNTER — Encounter (HOSPITAL_COMMUNITY): Payer: Self-pay

## 2019-09-27 NOTE — Progress Notes (Signed)
Today had a visit with Karla Sanchez at [facility].  She is currently living there again.  She left [facility] because she had to be quarantine to a room for 14 days and she did not like that.  She has edema in legs, feet and she states abdomen is swollen.  She gets short of breath easily with movement.  She props her self up on about 3 pillows at night.  She is taking 40 mg once a day of furosemide.  She sits in wheel chair with feet down a lot.  She states when she lays in the bed they go down.  She does not wear socks.  She has her medications but hard to tell if she is taking them correctly, some are older bottles and she states she has poured the meds in one bottle.  She does not want a pill box for her meds.  HF clinic rescheduled her appt for tomorrow, she states she will be ready at 8 for transportation.  She is wanting to find her self apartment, will look around to see if any are available.  Her wheel chair is breaking she states, she is wanting a new one.  I advised her she needs to get in with a PCP in [place] if she is staying here.  She states she is going to stay here, she has been going back and forth of going to [place] or staying.   Will contact APS again to see if they visited and if they are still going to help her.  She gets confused easily and not sure if they have came or not.  She has plenty for everyday living.  Took her a hot plate of food today, she has been eating from the pantry at hotel and breakfast they have.  She states the hotel has been good to her, they help in all kinds of ways.  Spoke to Freight forwarder on way out, they are going to reduce her rate per night to have it more affordable.  Advised her of that.  Will continue to visit for heart failure and anything else I might help with.   [place] [phone]
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
## 2019-09-28 ENCOUNTER — Ambulatory Visit: Payer: Medicare Other | Attending: Family | Admitting: Family

## 2019-09-28 ENCOUNTER — Telehealth: Payer: Self-pay | Admitting: Family

## 2019-09-28 ENCOUNTER — Other Ambulatory Visit: Payer: Self-pay

## 2019-09-28 ENCOUNTER — Encounter: Payer: Self-pay | Admitting: Family

## 2019-09-28 DIAGNOSIS — I10 Essential (primary) hypertension: Secondary | ICD-10-CM

## 2019-09-28 DIAGNOSIS — I5032 Chronic diastolic (congestive) heart failure: Secondary | ICD-10-CM

## 2019-09-28 DIAGNOSIS — E119 Type 2 diabetes mellitus without complications: Secondary | ICD-10-CM

## 2019-09-28 MED ORDER — METOPROLOL TARTRATE 50 MG PO TABS: 50.0000 mg | ORAL_TABLET | Freq: Two times a day (BID) | ORAL | 5 refills | Status: DC

## 2019-09-28 MED ORDER — ALBUTEROL SULFATE HFA 108 (90 BASE) MCG/ACT IN AERS: 2.0000 | INHALATION_SPRAY | Freq: Four times a day (QID) | RESPIRATORY_TRACT | 5 refills | Status: DC | PRN

## 2019-09-28 NOTE — Telephone Encounter (Signed)
Virtual Visit Pre-Appointment Phone Call  "[name] I am calling you today to discuss your upcoming appointment. We are currently trying to limit exposure to the virus that causes COVID-19 by seeing patients at home rather than in the office."  1. "What is the BEST phone number to call the day of the visit?" - include this in appointment notes  2. "Do you have or have access to (through a family member/friend) a smartphone with video capability that we can use for your visit?" a. If yes - list this number in appt notes as "cell" (if different from BEST phone #) and list the appointment type as a VIDEO visit in appointment notes b. If no - list the appointment type as a PHONE visit in appointment notes  3. Confirm consent - "In the setting of the current Covid19 crisis, you are scheduled for a (phone or video) visit with your provider on (date) at (time).  Just as we do with many in-office visits, in order for you to participate in this visit, we must obtain consent.  If you'd like, I can send this to your mychart (if signed up) or email for you to review.  Otherwise, I can obtain your verbal consent now.  All virtual visits are billed to your insurance company just like a normal visit would be.  By agreeing to a virtual visit, we'd like you to understand that the technology does not allow for your provider to perform an examination, and thus may limit your provider's ability to fully assess your condition. If your provider identifies any concerns that need to be evaluated in person, we will make arrangements to do so.  Finally, though the technology is pretty good, we cannot assure that it will always work on either your or our end, and in the setting of a video visit, we may have to convert it to a phone-only visit.  In either situation, we cannot ensure that we have a secure connection.  Are you willing to proceed?" STAFF: Did the patient verbally acknowledge consent to telehealth visit? Document  YES/NO here: YES  4. Advise patient to be prepared - "Two hours prior to your appointment, go ahead and check your blood pressure, pulse, oxygen saturation, and your weight (if you have the equipment to check those) and write them all down. When your visit starts, your provider will ask you for this information. If you have an Apple Watch or Kardia device, please plan to have heart rate information ready on the day of your appointment. Please have a pen and paper handy nearby the day of the visit as well."  5. Give patient instructions for MyChart download to smartphone OR [email or web address] as below if video visit (depending on what platform provider is using)  6. Inform patient they will receive a phone call 15 minutes prior to their appointment time (may be from unknown caller ID) so they should be prepared to answer    TELEPHONE CALL NOTE  Tasheika Kitzmiller has been deemed a candidate for a follow-up tele-health visit to limit community exposure during the Covid-19 pandemic. I spoke with the patient via phone to ensure availability of phone/video source, confirm preferred email & phone number, and discuss instructions and expectations.  I reminded [name] to be prepared with any vital sign and/or heart rhythm information that could potentially be obtained via home monitoring, at the time of her visit. I reminded [name] to expect a phone call prior to her visit.  [name], [place] 09/28/2019 10:23 AM   INSTRUCTIONS FOR DOWNLOADING THE MYCHART APP TO SMARTPHONE  - The patient must first make sure to have activated MyChart and know their login information - If Apple, go to [employer] and type in MyChart in the search bar and download the app. If Android, ask patient to go to [employer] and type in [place] in the search bar and download the app. The app is free but as with any other app downloads, their phone may require them to verify saved payment information or  Apple/Android password.  - The patient will need to then log into the app with their MyChart username and password, and select [facility] as their healthcare provider to link the account. When it is time for your visit, go to the MyChart app, find appointments, and click Begin Video Visit. Be sure to Select Allow for your device to access the Microphone and Camera for your visit. You will then be connected, and your provider will be with you shortly.  **If they have any issues connecting, or need assistance please contact MyChart service desk [phone])**  **If using a computer, in order to ensure the best quality for their visit they will need to use either of the following Internet Browsers: [employer], or Google Chrome**  IF USING DOXIMITY or DOXY.ME - The patient will receive a link just prior to their visit by text.     FULL LENGTH CONSENT FOR TELE-HEALTH VISIT   I hereby voluntarily request, consent and authorize [facility] and its employed or contracted physicians, physician assistants, nurse practitioners or other licensed health care professionals (the Practitioner), to provide me with telemedicine health care services (the "Services") as deemed necessary by the treating Practitioner. I acknowledge and consent to receive the Services by the Practitioner via telemedicine. I understand that the telemedicine visit will involve communicating with the Practitioner through live audiovisual communication technology and the disclosure of certain medical information by electronic transmission. I acknowledge that I have been given the opportunity to request an in-person assessment or other available alternative prior to the telemedicine visit and am voluntarily participating in the telemedicine visit.  I understand that I have the right to withhold or withdraw my consent to the use of telemedicine in the course of my care at any time, without affecting my right to future care or  treatment, and that the Practitioner or I may terminate the telemedicine visit at any time. I understand that I have the right to inspect all information obtained and/or recorded in the course of the telemedicine visit and may receive copies of available information for a reasonable fee.  I understand that some of the potential risks of receiving the Services via telemedicine include:  Marland Kitchen Delay or interruption in medical evaluation due to technological equipment failure or disruption; . Information transmitted may not be sufficient (e.g. poor resolution of images) to allow for appropriate medical decision making by the Practitioner; and/or  . In rare instances, security protocols could fail, causing a breach of personal health information.  Furthermore, I acknowledge that it is my responsibility to provide information about my medical history, conditions and care that is complete and accurate to the best of my ability. I acknowledge that Practitioner's advice, recommendations, and/or decision may be based on factors not within their control, such as incomplete or inaccurate data provided by me or distortions of diagnostic images or specimens that may result from electronic transmissions. I understand that  the practice of medicine is not an exact science and that Practitioner makes no warranties or guarantees regarding treatment outcomes. I acknowledge that I will receive a copy of this consent concurrently upon execution via email to the email address I last provided but may also request a printed copy by calling the office of [facility].    I understand that my insurance will be billed for this visit.   I have read or had this consent read to me. . I understand the contents of this consent, which adequately explains the benefits and risks of the Services being provided via telemedicine.  . I have been provided ample opportunity to ask questions regarding this consent and the Services and  have had my questions answered to my satisfaction. . I give my informed consent for the services to be provided through the use of telemedicine in my medical care  By participating in this telemedicine visit I agree to the above.

## 2019-09-29 ENCOUNTER — Other Ambulatory Visit: Payer: Self-pay | Admitting: Family

## 2019-09-29 MED ORDER — METOPROLOL TARTRATE 50 MG PO TABS: 50.0000 mg | ORAL_TABLET | Freq: Two times a day (BID) | ORAL | 5 refills | Status: DC

## 2019-09-29 MED ORDER — ALBUTEROL SULFATE HFA 108 (90 BASE) MCG/ACT IN AERS: 2.0000 | INHALATION_SPRAY | Freq: Four times a day (QID) | RESPIRATORY_TRACT | 5 refills | Status: DC | PRN

## 2019-10-02 ENCOUNTER — Telehealth (HOSPITAL_COMMUNITY): Payer: Self-pay

## 2019-10-02 ENCOUNTER — Other Ambulatory Visit: Payer: Self-pay

## 2019-10-02 ENCOUNTER — Emergency Department: Payer: Medicare Other

## 2019-10-02 DIAGNOSIS — N183 Chronic kidney disease, stage 3 unspecified: Secondary | ICD-10-CM | POA: Diagnosis present

## 2019-10-02 DIAGNOSIS — E1122 Type 2 diabetes mellitus with diabetic chronic kidney disease: Secondary | ICD-10-CM | POA: Diagnosis present

## 2019-10-02 DIAGNOSIS — D638 Anemia in other chronic diseases classified elsewhere: Secondary | ICD-10-CM | POA: Diagnosis present

## 2019-10-02 DIAGNOSIS — J9611 Chronic respiratory failure with hypoxia: Secondary | ICD-10-CM | POA: Diagnosis present

## 2019-10-02 DIAGNOSIS — Z794 Long term (current) use of insulin: Secondary | ICD-10-CM

## 2019-10-02 DIAGNOSIS — Z9049 Acquired absence of other specified parts of digestive tract: Secondary | ICD-10-CM

## 2019-10-02 DIAGNOSIS — I130 Hypertensive heart and chronic kidney disease with heart failure and stage 1 through stage 4 chronic kidney disease, or unspecified chronic kidney disease: Secondary | ICD-10-CM | POA: Diagnosis present

## 2019-10-02 DIAGNOSIS — Z20822 Contact with and (suspected) exposure to covid-19: Secondary | ICD-10-CM | POA: Diagnosis present

## 2019-10-02 DIAGNOSIS — Z79899 Other long term (current) drug therapy: Secondary | ICD-10-CM

## 2019-10-02 DIAGNOSIS — Z66 Do not resuscitate: Secondary | ICD-10-CM | POA: Diagnosis present

## 2019-10-02 DIAGNOSIS — Z993 Dependence on wheelchair: Secondary | ICD-10-CM

## 2019-10-02 DIAGNOSIS — N179 Acute kidney failure, unspecified: Secondary | ICD-10-CM | POA: Diagnosis present

## 2019-10-02 DIAGNOSIS — E875 Hyperkalemia: Secondary | ICD-10-CM | POA: Diagnosis present

## 2019-10-02 DIAGNOSIS — Z6839 Body mass index (BMI) 39.0-39.9, adult: Secondary | ICD-10-CM

## 2019-10-02 DIAGNOSIS — I5032 Chronic diastolic (congestive) heart failure: Secondary | ICD-10-CM | POA: Diagnosis present

## 2019-10-02 DIAGNOSIS — J441 Chronic obstructive pulmonary disease with (acute) exacerbation: Principal | ICD-10-CM | POA: Diagnosis present

## 2019-10-02 DIAGNOSIS — Z85038 Personal history of other malignant neoplasm of large intestine: Secondary | ICD-10-CM

## 2019-10-02 LAB — BASIC METABOLIC PANEL
Anion gap: 9 (ref 5–15)
BUN: 39 mg/dL — ABNORMAL HIGH (ref 8–23)
CO2: 30 mmol/L (ref 22–32)
Calcium: 8.5 mg/dL — ABNORMAL LOW (ref 8.9–10.3)
Chloride: 100 mmol/L (ref 98–111)
Creatinine, Ser: 1.22 mg/dL — ABNORMAL HIGH (ref 0.44–1.00)
GFR calc Af Amer: 49 mL/min — ABNORMAL LOW (ref >60)
GFR calc non Af Amer: 42 mL/min — ABNORMAL LOW (ref >60)
Glucose, Bld: 109 mg/dL — ABNORMAL HIGH (ref 70–99)
Potassium: 5.2 mmol/L — ABNORMAL HIGH (ref 3.5–5.1)
Sodium: 139 mmol/L (ref 135–145)

## 2019-10-02 LAB — CBC
HCT: 36.5 % (ref 36.0–46.0)
Hemoglobin: 10.6 g/dL — ABNORMAL LOW (ref 12.0–15.0)
MCH: 20.9 pg — ABNORMAL LOW (ref 26.0–34.0)
MCHC: 29 g/dL — ABNORMAL LOW (ref 30.0–36.0)
MCV: 72.1 fL — ABNORMAL LOW (ref 80.0–100.0)
Platelets: 310 10*3/uL (ref 150–400)
RBC: 5.06 MIL/uL (ref 3.87–5.11)
RDW: 21.7 % — ABNORMAL HIGH (ref 11.5–15.5)
WBC: 8.9 10*3/uL (ref 4.0–10.5)
nRBC: 0 % (ref 0.0–0.2)

## 2019-10-02 NOTE — ED Triage Notes (Signed)
BIB [facility] From home c/o SOB since yesterday. Pt with hx of COPD and CHF. Swelling to bilateral lower legs over last few days, compliant with taking her home lasix. Does not wear chronic oxygen, arrives on 2L [place] at this time, 96% on 2L. FD placed pt on oxygen PTA of EMS so unsure of RA saturation. All other VS WNL with EMS.

## 2019-10-02 NOTE — Telephone Encounter (Signed)
After several times got a hold of [place].  She states she got her inhaler and metoprolol but she wanted prednisone.  Advised her that Otila Kluver with HF clinic did not call her in any prednisone.  She states her breathing is bad and she needs some.  Advised her to go to ED for evaluation, she has no established PCP in the county or any other health care providers.  She states she will have no choice but to call 911 ina bit.  Will continue to for Heart Failure.   [place] EMT- Paramedic [phone]

## 2019-10-02 NOTE — Telephone Encounter (Signed)
When arrived at work had a voice mail from [place].  She advised Otila Kluver with HF clinic had call ed in her Albuterol inhaler and metoprolol to [facility] for her and she has not got them yet.  Contacted [facility] and they advised was delivered on Friday at 5:35 to her.  Attempted to cal her back with no anser.  Unable to leave message due to voice mail not set up.  She states she was just getting up and you could hear she was short of breath with wheezing.  She states she is needing to use her inhaler and she states she had some left from old inhaler.  She states she is trying to avoid going to ED because they will throw her into a nursing home, advised her if she needs to go she should go .  Will keep trying to contact her.  [place] [phone]

## 2019-10-03 ENCOUNTER — Other Ambulatory Visit: Payer: Self-pay

## 2019-10-03 ENCOUNTER — Observation Stay: Payer: Medicare Other

## 2019-10-03 ENCOUNTER — Inpatient Hospital Stay
Admission: EM | Admit: 2019-10-03 | Discharge: 2019-10-04 | DRG: 191 | Discharge status: Against Medical Advice | Payer: Medicare Other | Attending: Internal Medicine | Admitting: Internal Medicine

## 2019-10-03 DIAGNOSIS — Z85038 Personal history of other malignant neoplasm of large intestine: Secondary | ICD-10-CM | POA: Diagnosis not present

## 2019-10-03 DIAGNOSIS — D638 Anemia in other chronic diseases classified elsewhere: Secondary | ICD-10-CM | POA: Diagnosis present

## 2019-10-03 DIAGNOSIS — I5032 Chronic diastolic (congestive) heart failure: Secondary | ICD-10-CM

## 2019-10-03 DIAGNOSIS — Z79899 Other long term (current) drug therapy: Secondary | ICD-10-CM | POA: Diagnosis not present

## 2019-10-03 DIAGNOSIS — E1122 Type 2 diabetes mellitus with diabetic chronic kidney disease: Secondary | ICD-10-CM | POA: Diagnosis present

## 2019-10-03 DIAGNOSIS — Z66 Do not resuscitate: Secondary | ICD-10-CM | POA: Diagnosis present

## 2019-10-03 DIAGNOSIS — R0989 Other specified symptoms and signs involving the circulatory and respiratory systems: Secondary | ICD-10-CM

## 2019-10-03 DIAGNOSIS — Z794 Long term (current) use of insulin: Secondary | ICD-10-CM | POA: Diagnosis not present

## 2019-10-03 DIAGNOSIS — I509 Heart failure, unspecified: Secondary | ICD-10-CM

## 2019-10-03 DIAGNOSIS — J441 Chronic obstructive pulmonary disease with (acute) exacerbation: Secondary | ICD-10-CM

## 2019-10-03 DIAGNOSIS — Z20822 Contact with and (suspected) exposure to covid-19: Secondary | ICD-10-CM | POA: Diagnosis present

## 2019-10-03 DIAGNOSIS — N179 Acute kidney failure, unspecified: Secondary | ICD-10-CM | POA: Diagnosis present

## 2019-10-03 DIAGNOSIS — Z9049 Acquired absence of other specified parts of digestive tract: Secondary | ICD-10-CM | POA: Diagnosis not present

## 2019-10-03 DIAGNOSIS — J449 Chronic obstructive pulmonary disease, unspecified: Secondary | ICD-10-CM | POA: Diagnosis present

## 2019-10-03 DIAGNOSIS — Z6839 Body mass index (BMI) 39.0-39.9, adult: Secondary | ICD-10-CM | POA: Diagnosis not present

## 2019-10-03 DIAGNOSIS — I130 Hypertensive heart and chronic kidney disease with heart failure and stage 1 through stage 4 chronic kidney disease, or unspecified chronic kidney disease: Secondary | ICD-10-CM | POA: Diagnosis present

## 2019-10-03 DIAGNOSIS — N183 Chronic kidney disease, stage 3 unspecified: Secondary | ICD-10-CM | POA: Diagnosis present

## 2019-10-03 DIAGNOSIS — E119 Type 2 diabetes mellitus without complications: Secondary | ICD-10-CM | POA: Diagnosis present

## 2019-10-03 DIAGNOSIS — J9611 Chronic respiratory failure with hypoxia: Secondary | ICD-10-CM | POA: Diagnosis present

## 2019-10-03 DIAGNOSIS — E875 Hyperkalemia: Secondary | ICD-10-CM | POA: Diagnosis present

## 2019-10-03 DIAGNOSIS — Z993 Dependence on wheelchair: Secondary | ICD-10-CM | POA: Diagnosis not present

## 2019-10-03 DIAGNOSIS — R0602 Shortness of breath: Secondary | ICD-10-CM

## 2019-10-03 DIAGNOSIS — R600 Localized edema: Secondary | ICD-10-CM

## 2019-10-03 HISTORY — DX: Heart failure, unspecified: I50.9

## 2019-10-03 LAB — HIV ANTIBODY (ROUTINE TESTING W REFLEX): HIV Screen 4th Generation wRfx: NONREACTIVE

## 2019-10-03 LAB — HEMOGLOBIN A1C
Hgb A1c MFr Bld: 8 % — ABNORMAL HIGH (ref 4.8–5.6)
Mean Plasma Glucose: 182.9 mg/dL

## 2019-10-03 LAB — GLUCOSE, CAPILLARY
Glucose-Capillary: 206 mg/dL — ABNORMAL HIGH (ref 70–99)
Glucose-Capillary: 331 mg/dL — ABNORMAL HIGH (ref 70–99)
Glucose-Capillary: 232 mg/dL — ABNORMAL HIGH (ref 70–99)
Glucose-Capillary: 165 mg/dL — ABNORMAL HIGH (ref 70–99)

## 2019-10-03 LAB — TROPONIN I (HIGH SENSITIVITY)
Troponin I (High Sensitivity): 15 ng/L (ref <18)
Troponin I (High Sensitivity): 11 ng/L (ref <18)

## 2019-10-03 LAB — SARS CORONAVIRUS 2 BY RT PCR (HOSPITAL ORDER, PERFORMED IN ~~LOC~~ HOSPITAL LAB): SARS Coronavirus 2: NEGATIVE

## 2019-10-03 LAB — BRAIN NATRIURETIC PEPTIDE: B Natriuretic Peptide: 105.3 pg/mL — ABNORMAL HIGH (ref 0.0–100.0)

## 2019-10-03 MED ORDER — FUROSEMIDE 40 MG PO TABS
40.0000 mg | ORAL_TABLET | Freq: Every day | ORAL | Status: DC
  Administered 2019-10-03: 40 mg via ORAL
  Filled 2019-10-03: qty 1

## 2019-10-03 MED ORDER — COLCHICINE 0.6 MG PO TABS
0.6000 mg | ORAL_TABLET | Freq: Every day | ORAL | Status: DC
  Administered 2019-10-03: 12:00:00 0.6 mg via ORAL
  Filled 2019-10-03 (×2): qty 1

## 2019-10-03 MED ORDER — INSULIN ASPART 100 UNIT/ML ~~LOC~~ SOLN
0.0000 [IU] | Freq: Three times a day (TID) | SUBCUTANEOUS | Status: DC
  Administered 2019-10-03: 12:00:00 15 [IU] via SUBCUTANEOUS
  Administered 2019-10-03: 7 [IU] via SUBCUTANEOUS
  Filled 2019-10-03 (×2): qty 1

## 2019-10-03 MED ORDER — BUDESONIDE 0.25 MG/2ML IN SUSP
0.2500 mg | Freq: Two times a day (BID) | RESPIRATORY_TRACT | Status: DC
  Administered 2019-10-03: 20:00:00 0.25 mg via RESPIRATORY_TRACT
  Filled 2019-10-03 (×2): qty 2

## 2019-10-03 MED ORDER — METHYLPREDNISOLONE SODIUM SUCC 125 MG IJ SOLR
125.0000 mg | Freq: Once | INTRAMUSCULAR | Status: AC
  Administered 2019-10-03: 125 mg via INTRAMUSCULAR
  Filled 2019-10-03: qty 2

## 2019-10-03 MED ORDER — IPRATROPIUM-ALBUTEROL 0.5-2.5 (3) MG/3ML IN SOLN
3.0000 mL | Freq: Four times a day (QID) | RESPIRATORY_TRACT | Status: DC
  Administered 2019-10-03 (×2): 3 mL via RESPIRATORY_TRACT
  Filled 2019-10-03 (×3): qty 3

## 2019-10-03 MED ORDER — SODIUM CHLORIDE 0.9 % IV SOLN: 250.0000 mL | INTRAVENOUS | Status: DC | PRN

## 2019-10-03 MED ORDER — ACETAMINOPHEN 325 MG PO TABS: 650.0000 mg | ORAL_TABLET | Freq: Four times a day (QID) | ORAL | Status: DC | PRN

## 2019-10-03 MED ORDER — QUETIAPINE FUMARATE 25 MG PO TABS
25.0000 mg | ORAL_TABLET | Freq: Every day | ORAL | Status: DC
  Administered 2019-10-03: 22:00:00 25 mg via ORAL
  Filled 2019-10-03: qty 1

## 2019-10-03 MED ORDER — FUROSEMIDE 10 MG/ML IJ SOLN
40.0000 mg | Freq: Once | INTRAMUSCULAR | Status: AC
  Administered 2019-10-03: 40 mg via INTRAVENOUS
  Filled 2019-10-03: qty 4

## 2019-10-03 MED ORDER — SODIUM CHLORIDE 0.9% FLUSH: 3.0000 mL | INTRAVENOUS | Status: DC | PRN

## 2019-10-03 MED ORDER — ENOXAPARIN SODIUM 40 MG/0.4ML ~~LOC~~ SOLN
40.0000 mg | SUBCUTANEOUS | Status: DC
  Filled 2019-10-03: qty 0.4

## 2019-10-03 MED ORDER — INSULIN GLARGINE 100 UNIT/ML ~~LOC~~ SOLN
15.0000 [IU] | Freq: Every day | SUBCUTANEOUS | Status: DC
  Filled 2019-10-03: qty 0.15

## 2019-10-03 MED ORDER — METOPROLOL TARTRATE 50 MG PO TABS
50.0000 mg | ORAL_TABLET | Freq: Two times a day (BID) | ORAL | Status: DC
  Administered 2019-10-03 (×2): 50 mg via ORAL
  Filled 2019-10-03 (×2): qty 1

## 2019-10-03 MED ORDER — SODIUM CHLORIDE 0.9% FLUSH
3.0000 mL | Freq: Two times a day (BID) | INTRAVENOUS | Status: DC
  Administered 2019-10-03 (×2): 3 mL via INTRAVENOUS

## 2019-10-03 MED ORDER — LORATADINE 10 MG PO TABS
10.0000 mg | ORAL_TABLET | Freq: Every day | ORAL | Status: DC
  Administered 2019-10-03: 10 mg via ORAL
  Filled 2019-10-03: qty 1

## 2019-10-03 MED ORDER — IPRATROPIUM-ALBUTEROL 0.5-2.5 (3) MG/3ML IN SOLN
3.0000 mL | Freq: Once | RESPIRATORY_TRACT | Status: AC
  Administered 2019-10-03: 3 mL via RESPIRATORY_TRACT
  Filled 2019-10-03: qty 3

## 2019-10-03 MED ORDER — INSULIN DETEMIR 100 UNIT/ML ~~LOC~~ SOLN
15.0000 [IU] | Freq: Every day | SUBCUTANEOUS | Status: DC
  Administered 2019-10-03: 15 [IU] via SUBCUTANEOUS
  Filled 2019-10-03 (×2): qty 0.15

## 2019-10-03 MED ORDER — BUDESONIDE 0.5 MG/2ML IN SUSP
2.0000 mg | Freq: Four times a day (QID) | RESPIRATORY_TRACT | Status: DC
  Filled 2019-10-03: qty 8

## 2019-10-03 NOTE — H&P (Signed)
History and Physical    Karla Sanchez NID:[id number] DOB: December 14, 1940 DOA: 10/03/2019  PCP: Alfredia Client, MD   Patient coming from: Home   I have personally briefly reviewed patient's old medical records in [place]  Chief Complaint: Shortness of breath  HPI: Karla Sanchez is a 79 y.o. female with a known history of chronic diastolic heart failure, COPD, type 2 diabetes mellitus and hypertension, who presented to the emergency room with worsening dyspnea for the last several days associated with a cough productive of clear phlegm.  She has lower extremity swelling  Rt > Lt.  wheezing.   She denies having any chest pain, no fever, no chills, no nausea, no vomiting, no palpitations, no abdominal pain or any changes in her bowel habits. Patient was recently hospitalized for acute on chronic diastolic CHF exacerbation. While patient was being evaluated in the ER her pulse oximetry dropped to 83% on 2 L of oxygen and increased her oxygen to 3 L with improvement in her pulse oximetry to 94%. Labs show potassium of 5.2, serum creatinine of 1.22, BNP of 105 and negative troponin levels. Chest x-ray reviewed by me shows blunting of the left costophrenic angle. Twelve-lead EKG reviewed by me shows normal sinus rhythm with T wave inversions in the lateral leads and a right bundle branch block.  ED Course: Patient is a 79 year old female with a history significant for COPD and chronic diastolic dysfunction CHF who presents to the emergency room for evaluation of shortness of breath and wheezing.  Patient was noted to be hypoxic and requires oxygen supplementation to maintain pulse oximetry greater than 92%. She received a dose of IV Lasix in the ER as well as bronchodilator therapy and will be referred to observation status for further evaluation.  Review of Systems: As per HPI otherwise 10 point review of systems negative.    Past Medical History:  Diagnosis Date  . CHF  (congestive heart failure) ([place])   . Colon cancer [facility])    s/p right and left colectomy 2020  . COPD (chronic obstructive pulmonary disease) ([place])   . Diabetes mellitus without complication ([place])   . Diastolic heart failure ([place])   . Gout   . Hypertension     Past Surgical History:  Procedure Laterality Date  . COLON SURGERY    . TUBAL LIGATION  1970     reports that she has never smoked. She has never used smokeless tobacco. She reports previous alcohol use. She reports that she does not use drugs.  Allergies  Allergen Reactions  . Codeine Nausea Only, Other (See Comments) and Swelling    Other Reaction: nausea and vomiting Other reaction(s): NAUSEA   . Other Other (See Comments)    Other reaction(s): SWELLING  Other reaction(s): MUSCLE PAIN Muscle aches Other reaction(s): MUSCLE PAIN Muscle aches Other reaction(s): MUSCLE PAIN Muscle aches   . Exenatide Nausea And Vomiting  . Levofloxacin Other (See Comments)    Other Reaction: sloughing of buccal mucosa Other reaction(s): OTHER Lost skin in mouth Patient states she can take Cipro without difficulty and has taken it many times in the past Other Reaction: sloughing of buccal mucosa   . Losartan Other (See Comments)    Weight gain Weight gain   . Tetracyclines & Related Other (See Comments)    No family history on file.   Prior to Admission medications   Medication Sig Start Date End Date Taking? Authorizing Provider  acetaminophen (TYLENOL) 325 MG tablet Take 650  mg by mouth every 6 (six) hours as needed.    [provider]  albuterol (VENTOLIN HFA) 108 (90 Base) MCG/ACT inhaler Inhale 2 puffs into the lungs every 6 (six) hours as needed for wheezing or shortness of breath. 09/29/19   [name], FNP  colchicine 0.6 MG tablet Take 0.6 mg by mouth daily. 04/13/19   [provider]  furosemide (LASIX) 40 MG tablet Take 1 tablet (40 mg total) by mouth daily. Patient taking differently: Take  40 mg by mouth 2 (two) times daily.  07/13/19 09/28/19  Lorella Nimrod, MD  insulin glargine (LANTUS) 100 UNIT/ML injection Inject 0.15 mLs (15 Units total) into the skin at bedtime. 07/13/19   Lorella Nimrod, MD  loratadine (CLARITIN) 10 MG tablet Take 1 tablet (10 mg total) by mouth daily. 08/03/19 10/02/19  Arta Silence, MD  metoprolol tartrate (LOPRESSOR) 50 MG tablet Take 1 tablet (50 mg total) by mouth 2 (two) times daily. 09/29/19   Hackney, Otila Kluver A, FNP  SYRINGE-NEEDLE, DISP, 3 ML (LUER LOCK SAFETY SYRINGES) 25G X 5/8" 3 ML MISC 15 Units by Does not apply route at bedtime. 07/13/19   Lorella Nimrod, MD    Physical Exam: Vitals:   10/03/19 0437 10/03/19 0721 10/03/19 0723 10/03/19 0819  BP: (!) 171/55  (!) 151/73 (!) 151/55  Pulse: 81 91 91 93  Resp: 19 16 18 18   Temp: 97.7 F (36.5 C)   98 F (36.7 C)  TempSrc: Oral     SpO2: 99% 97% 97% (!) 86%  Weight:      Height:         Vitals:   10/03/19 0437 10/03/19 0721 10/03/19 0723 10/03/19 0819  BP: (!) 171/55  (!) 151/73 (!) 151/55  Pulse: 81 91 91 93  Resp: 19 16 18 18   Temp: 97.7 F (36.5 C)   98 F (36.7 C)  TempSrc: Oral     SpO2: 99% 97% 97% (!) 86%  Weight:      Height:        Constitutional: NAD, alert and oriented x 3 Eyes: PERRL, lids and conjunctivae normal ENMT: Mucous membranes are moist.  Neck: normal, supple, no masses, no thyromegaly Respiratory: Scattered wheezing, faint crackles. Normal respiratory effort. No accessory muscle use.  Cardiovascular: Regular rate and rhythm, no murmurs / rubs / gallops.  extremity edema Rt > Lt) . 2+ pedal pulses. No carotid bruits.  Abdomen: no tenderness, no masses palpated. No hepatosplenomegaly. Bowel sounds positive.  Central adiposity Musculoskeletal: no clubbing / cyanosis. No joint deformity upper and lower extremities.  Skin: no rashes, lesions, ulcers.  Neurologic: No gross focal neurologic deficit. Psychiatric: Normal mood and affect.   Labs on Admission: I  have personally reviewed following labs and imaging studies  CBC: Recent Labs  Lab 10/02/19 2057  WBC 8.9  HGB 10.6*  HCT 36.5  MCV 72.1*  PLT 888   Basic Metabolic Panel: Recent Labs  Lab 10/02/19 2057  NA 139  K 5.2*  CL 100  CO2 30  GLUCOSE 109*  BUN 39*  CREATININE 1.22*  CALCIUM 8.5*   GFR: Estimated Creatinine Clearance: 47.2 mL/min (A) (by C-G formula based on SCr of 1.22 mg/dL (H)). Liver Function Tests: No results for input(s): AST, ALT, ALKPHOS, BILITOT, PROT, ALBUMIN in the last 168 hours. No results for input(s): LIPASE, AMYLASE in the last 168 hours. No results for input(s): AMMONIA in the last 168 hours. Coagulation Profile: No results for input(s): INR, PROTIME in  the last 168 hours. Cardiac Enzymes: No results for input(s): CKTOTAL, CKMB, CKMBINDEX, TROPONINI in the last 168 hours. BNP (last 3 results) No results for input(s): PROBNP in the last 8760 hours. HbA1C: No results for input(s): HGBA1C in the last 72 hours. CBG: Recent Labs  Lab 10/03/19 0821  GLUCAP 206*   Lipid Profile: No results for input(s): CHOL, HDL, LDLCALC, TRIG, CHOLHDL, LDLDIRECT in the last 72 hours. Thyroid Function Tests: No results for input(s): TSH, T4TOTAL, FREET4, T3FREE, THYROIDAB in the last 72 hours. Anemia Panel: No results for input(s): VITAMINB12, FOLATE, FERRITIN, TIBC, IRON, RETICCTPCT in the last 72 hours. Urine analysis:    Component Value Date/Time   COLORURINE STRAW (A) 07/30/2019 2204   APPEARANCEUR CLEAR (A) 07/30/2019 2204   LABSPEC 1.009 07/30/2019 2204   PHURINE 5.0 07/30/2019 2204   GLUCOSEU NEGATIVE 07/30/2019 2204   HGBUR NEGATIVE 07/30/2019 2204   [place] 07/30/2019 2204   KETONESUR NEGATIVE 07/30/2019 2204   PROTEINUR 100 (A) 07/30/2019 2204   NITRITE NEGATIVE 07/30/2019 2204   LEUKOCYTESUR NEGATIVE 07/30/2019 2204    Radiological Exams on Admission: DG Chest 2 View  Result Date: 10/02/2019 CLINICAL DATA:  79 year old  female with shortness of breath and CHF. EXAM: CHEST - 2 VIEW COMPARISON:  Chest radiograph dated 09/06/2019. FINDINGS: There is left lung base hazy density with blunting of the left costophrenic angle which may be chronic or represent small left pleural effusion and left lung base atelectasis. Pneumonia is not excluded clinical correlation is recommended. No focal consolidation or pneumothorax. The cardiac silhouette is within limits. Atherosclerotic calcification of the aorta. Degenerative changes of the spine and shoulders. No acute osseous pathology. IMPRESSION: Chronic changes versus small left pleural effusion and left lung base atelectasis. Pneumonia is not excluded. Electronically Signed   By: Anner Crete M.D.   On: 10/02/2019 21:29    EKG: Independently reviewed.  Normal sinus rhythm T wave inversions in the lateral leads Right bundle branch block   Assessment/Plan Principal Problem:   COPD with acute exacerbation (HCC) Active Problems:   Chronic diastolic CHF (congestive heart failure) (HCC)   Type 2 diabetes mellitus with stage 3 chronic kidney disease (HCC)   Anemia of chronic disease   Obesity, Class III, BMI 40-49.9 (morbid obesity) ([place])    COPD with acute exacerbation Patient with a history of COPD who presents to the ER with a 3-day history of worsening shortness of breath associated with wheezing and a cough productive of clear phlegm. She was hypoxic in the ER requiring oxygen supplementation to maintain pulse oximetry greater than 92% Placed patient on scheduled and as needed bronchodilator therapy Place patient on inhaled steroids Patient is currently on 3 L of oxygen and will need to be assessed for home oxygen need prior to discharge.   Chronic diastolic dysfunction CHF Last known LVEF 50 to 55% Continue with Lasix 40 mg daily Continue metoprolol We will add amlodipine to optimize blood pressure control Patient not on ACE inhibitor or ARB due to  allergy Maintain low-sodium diet   Diabetes mellitus type 2 with complications of stage III chronic kidney disease Continue long-acting insulin Place patient on sliding scale coverage Maintain consistent carbohydrate diet   Morbid obesity (BMI 39) Complicates overall prognosis and care   Right lower extremity swelling Rule out DVT Obtain lower extremity venous Doppler   Anemia of chronic disease H&H is stable  DVT prophylaxis: Lovenox Code Status: Full code Family Communication: Greater than 50% of time was spent  discussing plan of care with patient at the bedside.  All questions and concerns have been addressed.  She verbalizes understanding and agrees with the plan.  CODE STATUS was discussed and patient wants to be a full code, she states that in the past she has been a DNR but would like to be full code at this time Disposition Plan: Back to previous home environment Consults called: None    Lujean Ebright MD [facility]     10/03/2019, 9:10 AM

## 2019-10-03 NOTE — ED Notes (Signed)
This RN attempted to re-assess pt's vitals. Pt sitting in straight back hard blue chair with oxygen removed stating, "I cant breath". This RN asked pt to please sit in the bed in order for me to re-check her vitals and oxygen. Pt states " This oxygen isnt helping me, my oxygen was 93% when I came in. I can breath better with this not in my nose and I cant sit in the bed cause it makes me short of breath.". this RN informed pt that the bed could sit completley upright as the chair she is in does, and mentioned that it is easier to manage her oxygen saturation and vitals if your sitting in the bed. Pt st "No I aint getting in that bed". This RN moved a blue chair closer to the bed and asked pt to please transfer into the chair closer to the bed. Pt st "I dont have feet I cant move other there, I mean I have feet but I dont have any bones in my feet". This RN asked how the pt transfers in the hotel and pt st she is wheelchair bound but does not have her wheelchair. Pt refusing to attempt to transfer at this time. Pt refusing to wear oxygen or monitoring equipment at this time .   Pt tachypnea and speaking in full complete sentences at this time

## 2019-10-03 NOTE — TOC Initial Note (Signed)
Transition of Care [facility]) - Initial/Assessment Note    Patient Details  Name: Karla Sanchez MRN: [id number] Date of Birth: 10-26-1940  Transition of Care [facility]) CM/SW Contact:    Elease Hashimoto, LCSW Phone Number: 10/03/2019, 2:18 PM  Clinical Narrative: High risk screening completed. Pt was at [facility] after last admission but left there due to felt did not need NHP. When asked if wanted to work on LTC bed she reported no I would rather be on my own. She has been at the [facility] for the past two months and she enjoys it and the staff treat her like a long lost aunt and check on her daily. Her daughter is ion [place] and she also has a granddaughter there. She has no plans to go up there. She was born and raised in [place].  She reports she is mod/i with her walker or her wheelhcair but at times has a hard time breathing and does not have home O2 at the hotel. She makes 2100 per month and is not eligible for medicaid. She really needs an ALF but can not afford this with her current income. [facility] and has meals delivered also-dinner. Breakfast is provided by the hotel. She has a nephew who she can call on some but he was married to her niece who has passed away. She has been widowed for 2 years and has been living and moving constantly since this time. Will follow to work on discharge needs and to provide support.             Expected Discharge Plan: Home/Self Care Barriers to Discharge: Continued Medical Work up, Homeless with medical needs   Patient Goals and CMS Choice Patient states their goals for this hospitalization and ongoing recovery are:: I'm not sure what I am going to do. I don;t need SNF or want LTC CMS Medicare.gov Compare Post Acute Care list provided to:: Patient Choice offered to / list presented to : Patient  Expected Discharge Plan and Services Expected Discharge Plan: Home/Self Care In-house Referral: Clinical Social  Work Discharge Planning Services: Mobile Meals Post Acute Care Choice: Home Health Living arrangements for the past 2 months: Hotel/Motel                                      Prior Living Arrangements/Services Living arrangements for the past 2 months: Hotel/Motel Lives with:: Self Patient language and need for interpreter reviewed:: No Do you feel safe going back to the place where you live?: Yes      Need for Family Participation in Patient Care: Yes (Comment) Care giver support system in place?: No (comment) Current home services: DME (rw and wheelchair) Criminal Activity/Legal Involvement Pertinent to Current Situation/Hospitalization: No - Comment as needed  Activities of Daily Living Home Assistive Devices/Equipment: Wheelchair, Eyeglasses ADL Screening (condition at time of admission) Patient's cognitive ability adequate to safely complete daily activities?: Yes Is the patient deaf or have difficulty hearing?: No Does the patient have difficulty seeing, even when wearing glasses/contacts?: No Does the patient have difficulty concentrating, remembering, or making decisions?: No Patient able to express need for assistance with ADLs?: Yes Does the patient have difficulty dressing or bathing?: No Independently performs ADLs?: No Communication: Independent Dressing (OT): Independent Grooming: Independent Feeding: Independent Bathing: Independent Toileting: Independent with device (comment) In/Out Bed: Independent with device (comment) Walks in Home:  Dependent Is this a change from baseline?: Pre-admission baseline Does the patient have difficulty walking or climbing stairs?: Yes Weakness of Legs: Both Weakness of Arms/Hands: None  Permission Sought/Granted Permission sought to share information with : Family Supports Permission granted to share information with : Yes, Verbal Permission Granted  Share Information with NAME: [name]  Permission granted to share info  w AGENCY: HOme health if recommended  Permission granted to share info w Relationship: friend     Emotional Assessment Appearance:: Appears stated age Attitude/Demeanor/Rapport: Gracious, Charismatic, Engaged Affect (typically observed): Adaptable, Accepting Orientation: : Oriented to Place, Oriented to Self, Oriented to  Time, Oriented to Situation Alcohol / Substance Use: Not Applicable Psych Involvement: No (comment)  Admission diagnosis:  COPD exacerbation (HCC) [J44.1] COPD with acute exacerbation ([place]) [J44.1] Acute on chronic congestive heart failure, unspecified heart failure type [facility]) [I50.9] Patient Active Problem List   Diagnosis Date Noted  . COPD with acute exacerbation ([place]) 10/03/2019  . Obesity, Class III, BMI 40-49.9 (morbid obesity) ([place]) 10/03/2019  . Acute exacerbation of CHF (congestive heart failure) ([place]) 09/08/2019  . Acute CHF (congestive heart failure) ([place]) 09/07/2019  . COPD exacerbation ([place]) 07/09/2019  . Chronic diastolic CHF (congestive heart failure) ([place]) 07/09/2019  . Type 2 diabetes mellitus with stage 3 chronic kidney disease ([place]) 07/09/2019  . Peripheral edema 07/09/2019  . Gout 07/09/2019  . Obesity, diabetes, and hypertension syndrome ([place]) 07/09/2019  . Anemia of chronic disease 07/09/2019   PCP:  Alfredia Client, MD Pharmacy:   [facility] [place], [place] [address] Phone: [phone] Fax: [phone]  [place], [place] - [place] [address] Phone: [phone] Fax: [phone]     Social Determinants of Health (SDOH) Interventions    Readmission Risk Interventions No flowsheet data found.

## 2019-10-03 NOTE — ED Provider Notes (Signed)
[facility] Emergency Department Provider Note   ____________________________________________   First MD Initiated Contact with Patient 10/03/19 0241     (approximate)  I have reviewed the triage vital signs and the nursing notes.   HISTORY  Chief Complaint Shortness of Breath    HPI Karla Sanchez is a 79 y.o. female brought to the ED via EMS from local motel where she lives with a chief complaint of shortness of breath.  Patient has a history of COPD and CHF.  According to her records it looks seems like she should be wearing chronic oxygen but she does not.  Patient requesting prednisone at the start of our interview and examination.  She has been in contact with the heart failure clinic to manage her medications and states she needs prednisone to "clear this flareup".  Complains of shortness of breath for the past 1 to 2 days.  States she is compliant with her Lasix.  Denies fever, chills, chest pain, abdominal pain, nausea, vomiting or diarrhea.       Past Medical History:  Diagnosis Date  . CHF (congestive heart failure) ([place])   . Colon cancer [facility])    s/p right and left colectomy 2020  . COPD (chronic obstructive pulmonary disease) ([place])   . Diabetes mellitus without complication ([place])   . Diastolic heart failure ([place])   . Gout   . Hypertension     Patient Active Problem List   Diagnosis Date Noted  . Acute exacerbation of CHF (congestive heart failure) ([place]) 09/08/2019  . Acute CHF (congestive heart failure) ([place]) 09/07/2019  . COPD exacerbation ([place]) 07/09/2019  . Acute on chronic diastolic congestive heart failure ([place]) 07/09/2019  . DM (diabetes mellitus) ([place]) 07/09/2019  . Peripheral edema 07/09/2019  . Gout 07/09/2019  . Obesity, diabetes, and hypertension syndrome ([place]) 07/09/2019  . Anemia 07/09/2019    Past Surgical History:  Procedure Laterality Date  . COLON SURGERY    . TUBAL LIGATION  1970    Prior to Admission  medications   Medication Sig Start Date End Date Taking? Authorizing Provider  acetaminophen (TYLENOL) 325 MG tablet Take 650 mg by mouth every 6 (six) hours as needed.    [provider]  albuterol (VENTOLIN HFA) 108 (90 Base) MCG/ACT inhaler Inhale 2 puffs into the lungs every 6 (six) hours as needed for wheezing or shortness of breath. 09/29/19   [name], FNP  colchicine 0.6 MG tablet Take 0.6 mg by mouth daily. 04/13/19   [provider]  furosemide (LASIX) 40 MG tablet Take 1 tablet (40 mg total) by mouth daily. Patient taking differently: Take 40 mg by mouth 2 (two) times daily.  07/13/19 09/28/19  Lorella Nimrod, MD  insulin glargine (LANTUS) 100 UNIT/ML injection Inject 0.15 mLs (15 Units total) into the skin at bedtime. 07/13/19   Lorella Nimrod, MD  loratadine (CLARITIN) 10 MG tablet Take 1 tablet (10 mg total) by mouth daily. 08/03/19 10/02/19  Arta Silence, MD  metoprolol tartrate (LOPRESSOR) 50 MG tablet Take 1 tablet (50 mg total) by mouth 2 (two) times daily. 09/29/19   Hackney, Otila Kluver A, FNP  SYRINGE-NEEDLE, DISP, 3 ML (LUER LOCK SAFETY SYRINGES) 25G X 5/8" 3 ML MISC 15 Units by Does not apply route at bedtime. 07/13/19   Lorella Nimrod, MD    Allergies Codeine, Other, Exenatide, Levofloxacin, Losartan, and Tetracyclines & related  No family history on file.  Social History Social History   Tobacco Use  .  Smoking status: Never Smoker  . Smokeless tobacco: Never Used  Substance Use Topics  . Alcohol use: Not Currently  . Drug use: Never    Review of Systems  Constitutional: No fever/chills Eyes: No visual changes. ENT: No sore throat. Cardiovascular: Denies chest pain. Respiratory: Positive for shortness of breath. Gastrointestinal: No abdominal pain.  No nausea, no vomiting.  No diarrhea.  No constipation. Genitourinary: Negative for dysuria. Musculoskeletal: Negative for back pain. Skin: Negative for rash. Neurological: Negative for headaches,  focal weakness or numbness.   ____________________________________________   PHYSICAL EXAM:  VITAL SIGNS: ED Triage Vitals  Enc Vitals Group     BP 10/02/19 2053 (!) 166/98     Pulse Rate 10/02/19 2053 65     Resp 10/02/19 2053 20     Temp 10/02/19 2053 98.3 F (36.8 C)     Temp Source 10/02/19 2053 Oral     SpO2 10/02/19 2053 95 %     Weight 10/02/19 2055 244 lb 11.4 oz (111 kg)     Height 10/02/19 2055 5\' 6"  (1.676 m)     Head Circumference --      Peak Flow --      Pain Score 10/02/19 2054 0     Pain Loc --      Pain Edu? --      Excl. in [place]? --     Constitutional: Alert and oriented.  Chronically ill appearing and in mild acute distress. Eyes: Conjunctivae are normal. PERRL. EOMI. Head: Atraumatic. Nose: No congestion/rhinnorhea. Mouth/Throat: Mucous membranes are moist.   Neck: No stridor.   Cardiovascular: Normal rate, regular rhythm. Grossly normal heart sounds.  Good peripheral circulation. Respiratory: Normal respiratory effort.  No retractions. Lungs with forced audible wheezing. Gastrointestinal: Obese.  Soft and nontender. No distention. No abdominal bruits. No CVA tenderness. Musculoskeletal: No lower extremity tenderness. 2+ nonpitting BLE edema.  No joint effusions. Neurologic:  Normal speech and language. No gross focal neurologic deficits are appreciated.  Skin:  Skin is warm, dry and intact. No rash noted. Psychiatric: Mood and affect are normal. Speech and behavior are normal.  ____________________________________________   LABS (all labs ordered are listed, but only abnormal results are displayed)  Labs Reviewed  BASIC METABOLIC PANEL - Abnormal; Notable for the following components:      Result Value   Potassium 5.2 (*)    Glucose, Bld 109 (*)    BUN 39 (*)    Creatinine, Ser 1.22 (*)    Calcium 8.5 (*)    GFR calc non Af Amer 42 (*)    GFR calc Af Amer 49 (*)    All other components within normal limits  CBC - Abnormal; Notable for the  following components:   Hemoglobin 10.6 (*)    MCV 72.1 (*)    MCH 20.9 (*)    MCHC 29.0 (*)    RDW 21.7 (*)    All other components within normal limits  BRAIN NATRIURETIC PEPTIDE - Abnormal; Notable for the following components:   B Natriuretic Peptide 105.3 (*)    All other components within normal limits  SARS CORONAVIRUS 2 BY RT PCR (HOSPITAL ORDER, [place] [facility])  TROPONIN I (HIGH SENSITIVITY)  TROPONIN I (HIGH SENSITIVITY)   ____________________________________________  EKG  ED ECG REPORT I, Alyx Mcguirk J, the attending physician, personally viewed and interpreted this ECG.   Date: 10/03/2019  EKG Time: 2058  Rate: 64  Rhythm: normal EKG, normal sinus rhythm  Axis:  LAD  Intervals:right bundle branch block  ST&T Change: Nonspecific  ____________________________________________  RADIOLOGY  ED MD interpretation: Chronic changes; small left pleural effusion  Official radiology report(s): DG Chest 2 View  Result Date: 10/02/2019 CLINICAL DATA:  79 year old female with shortness of breath and CHF. EXAM: CHEST - 2 VIEW COMPARISON:  Chest radiograph dated 09/06/2019. FINDINGS: There is left lung base hazy density with blunting of the left costophrenic angle which may be chronic or represent small left pleural effusion and left lung base atelectasis. Pneumonia is not excluded clinical correlation is recommended. No focal consolidation or pneumothorax. The cardiac silhouette is within limits. Atherosclerotic calcification of the aorta. Degenerative changes of the spine and shoulders. No acute osseous pathology. IMPRESSION: Chronic changes versus small left pleural effusion and left lung base atelectasis. Pneumonia is not excluded. Electronically Signed   By: Anner Crete M.D.   On: 10/02/2019 21:29    ____________________________________________   PROCEDURES  Procedure(s) performed (including Critical  Care):  Procedures   ____________________________________________   INITIAL IMPRESSION / ASSESSMENT AND PLAN / ED COURSE  As part of my medical decision making, I reviewed the following data within the [place] notes reviewed and incorporated, Labs reviewed, EKG interpreted, Old chart reviewed, Radiograph reviewed and Notes from prior ED visits     Karla Sanchez was evaluated in Emergency Department on 10/03/2019 for the symptoms described in the history of present illness. She was evaluated in the context of the global COVID-19 pandemic, which necessitated consideration that the patient might be at risk for infection with the SARS-CoV-2 virus that causes COVID-19. Institutional protocols and algorithms that pertain to the evaluation of patients at risk for COVID-19 are in a state of rapid change based on information released by regulatory bodies including the CDC and federal and state organizations. These policies and algorithms were followed during the patient's care in the ED.    79 year old female with COPD and CHF who presents with shortness of breath. Differential includes, but is not limited to, viral syndrome, bronchitis including COPD exacerbation, pneumonia, reactive airway disease including asthma, CHF including exacerbation with or without pulmonary/interstitial edema, pneumothorax, ACS, thoracic trauma, and pulmonary embolism.  Laboratory results demonstrate stable AKI and BNP.  Patient refuses to wear her oxygen or get in the bed or even get in a recliner close to the cardiac monitor.  Will not keep her pulse oximeter on.  Refuses repeat blood draw for troponin.  Refusing IV.  Agrees to IM Solu-Medrol and DuoNeb.  Will reassess.   Clinical Course as of Oct 03 527  Tue Oct 03, 2019  5374 Audible wheezing remains after DuoNeb.  Patient tachypneic.  Now agrees to IV for Lasix diuresis and second DuoNeb.  Will discuss with hospitalist services for  admission.   [JS]    Clinical Course User Index [JS] Paulette Blanch, MD     ____________________________________________   FINAL CLINICAL IMPRESSION(S) / ED DIAGNOSES  Final diagnoses:  COPD exacerbation ([place])  Acute on chronic congestive heart failure, unspecified heart failure type [facility])     ED Discharge Orders    None       Note:  This document was prepared using Dragon voice recognition software and may include unintentional dictation errors.   Paulette Blanch, MD 10/03/19 [phone]

## 2019-10-03 NOTE — Progress Notes (Signed)
PT Cancellation Note  Patient Details Name: Karla Sanchez MRN: [id number] DOB: [birth year]   Cancelled Treatment:    Reason Eval/Treat Not Completed: Medical issues which prohibited therapy Consult received, chart reviewed. Pt noted with pending ultrasound to rule of DVT. Also noting critical K+ of 5.2. Will hold PT evaluation and re-attempt at later date/time as pt is medically appropriate  Kreg Shropshire, DPT 10/03/2019, 10:05 AM

## 2019-10-03 NOTE — Progress Notes (Signed)
OT Cancellation Note  Patient Details Name: Karla Sanchez MRN: [id number] DOB: 04-08-1940   Cancelled Treatment:    Reason Eval/Treat Not Completed: Other (comment);Medical issues which prohibited therapy. Consult received, chart reviewed. Pt noted with pending ultrasound to rule of DVT. Also noting critical K+ of 5.2. Will hold OT evaluation and re-attempt at later date/time as pt is medically appropriate.   Jeni Salles, MPH, MS, OTR/L ascom [phone] 10/03/19, 10:00 AM

## 2019-10-04 LAB — GLUCOSE, CAPILLARY: Glucose-Capillary: 108 mg/dL — ABNORMAL HIGH (ref 70–99)

## 2019-10-04 MED ORDER — LORAZEPAM 2 MG/ML IJ SOLN
INTRAMUSCULAR | Status: AC
  Filled 2019-10-04: qty 1

## 2019-10-04 NOTE — Significant Event (Addendum)
Pt called out around 0130 saying she couldn't breathe. This RN and NT ran to room to help patient.  Patient was actively trying to get out of bed and she is a fall risk, so this RN and the NT told the patient to wait and let us help her if she needed to get up.   Pt then started screaming and cursing saying she couldn't breathe and we aren't trying to help her. (At this time charge RN, and other staff came to bedside to assist) I politely told the patient that if she would stop screaming, and place her oxygen in her nose that she would be able to breathe better. Patient became irate, yelling/screaming/cursing at staff and threw water pitcher across the room. When staff politely told the patient that that kind of behavior is inappropriate, patient threw water pitcher across the room. Pt then demanded that we get her out of bed and into a chair, and that she is going home. NP paged at this time. Security was also notified as well.  We again reminded the patient that that kind of behavior is inappropriate and now that she threw water all over the room we cannot safely get her to the chair. Pt continued to scream and curse at staff, and said she was going to get to chair with or without our help. So patient got into the chair despite our attempts to tell her it was unsafe to do so. Pt now demanding staff to get her water, and various things. When we told her it would be unsafe to leave her unattended, she proceeded yell/curse and throw her wallet across the room. This RN had to duck to miss the wallet that was throw, and then threatened to bust the TV screen because we would not oblige to her demands. Pt then demanded that we let her go home to the [facility], and that she could better care for herself there than we are here. NP and security at bedside at this time, and after 30-45 minutes getting patient to calm down and actually have a conversation, patient stated that she would stay until morning when she can  get a taxi to pick her up.   (Also noted during this whole event, patient grabbed her inhaler from her purse and used it a total of 8 times. NP aware of this.)

## 2019-10-04 NOTE — Progress Notes (Signed)
Patient signed AMA form, and left with ride

## 2019-10-04 NOTE — Progress Notes (Signed)
OT Cancellation Note  Patient Details Name: Karla Sanchez MRN: [id number] DOB: 1940/07/14   Cancelled Treatment:    Reason Eval/Treat Not Completed: Other (comment);Medical issues which prohibited therapy. Order received and chart reviewed. Pt potassium levels continue to remain outside parameters recommended for therapy (5.2 as of most recent lab values). Will hold OT evaluation at this time and initiate services as available and pt medically appropriate. Thank you.   Shara Blazing, M.S., OTR/L Ascom: [phone] 10/04/19, 8:06 AM

## 2019-10-04 NOTE — Progress Notes (Signed)
Pt refused nebulizer's stating "I'm not doing that, get out"

## 2019-10-04 NOTE — Discharge Summary (Signed)
Physician Discharge Summary  [name] WCB:[id number] DOB: [birth year] DOA: 10/03/2019  PCP: Alfredia Client, MD  Admit date: 10/03/2019 Discharge date: 10/04/2019  Admitted From: home Disposition:  Pt left AMA   Recommendations for Outpatient Follow-up:  Pt left AMA   Home Health: no  Equipment/Devices:  Discharge Condition: guarded, pt left AMA  CODE STATUS: full  Diet recommendation: Heart Healthy / Carb Modified  Brief/Interim Summary:  H&P taken from Dr. Francine Graven: Karla Sanchez is a 79 y.o. femalewith a known history of chronic diastolic heart failure, COPD, type 2 diabetes mellitus and hypertension, who presented to the emergency room with worsening dyspnea for the last several days associated with a cough productive of clear phlegm.  She has lower extremity swelling  Rt > Lt.  wheezing.  She denies having any chest pain, no fever, no chills, no nausea, no vomiting, no palpitations, no abdominal pain or any changes in her bowel habits. Patient was recently hospitalized for acute on chronic diastolic CHF exacerbation. While patient was being evaluated in the ER her pulse oximetry dropped to 83% on 2 L of oxygen and increased her oxygen to 3 L with improvement in her pulse oximetry to 94%. Labs show potassium of 5.2, serum creatinine of 1.22, BNP of 105 and negative troponin levels. Chest x-ray reviewed by me shows blunting of the left costophrenic angle. Twelve-lead EKG reviewed by me shows normal sinus rhythm with T wave inversions in the lateral leads and a right bundle branch block.  ED Course: Patient is a 79 year old female with a history significant for COPD and chronic diastolic dysfunction CHF who presents to the emergency room for evaluation of shortness of breath and wheezing.  Patient was noted to be hypoxic and requires oxygen supplementation to maintain pulse oximetry greater than 92%. She received a dose of IV Lasix in the ER as well as  bronchodilator therapy and will be referred to observation status for further evaluation.  Hospital Course from Dr. Lenise Herald 10/04/19: Pt evidently overnight became violent throwing different things at nurses/staff as well as cursing at them. Pt stated she can take better care of herself at her home which is currently a [facility]. Pt signed out AMA despite my strong recommendation not too.    Discharge Diagnoses:  Principal Problem:   COPD with acute exacerbation ([place]) Active Problems:   Chronic diastolic CHF (congestive heart failure) (HCC)   Type 2 diabetes mellitus with stage 3 chronic kidney disease (HCC)   Anemia of chronic disease   Obesity, Class III, BMI 40-49.9 (morbid obesity) ([place])  COPD exacerbation: continue on bronchodilators. Continue on supplemental oxygen and wean as tolerated  Chronic diastolic CHF: continue on metoprolol, lasix. Started on amlodipine Last EF 50-55%  Chronic hypoxic respiratory failure: continue on supplemental oxygen   DM2: continue on SSI w/ accuchecks. Carb modified diet\  Morbid obesity: would benefit from weight loss  Chronic RLE swelling: etiology unclear, possible chronic venous insufficiency  ACD: no need for a transfusion at this time   Hyperkalemia: pt refused repeat lab work and states it was due to her eating a lot of bananas   Discharge Instructions  Pt left AMA    Follow-up Information    [facility] Follow up on 11/07/2019.   Specialty: Cardiology Why: at 1:00pm. Enter through the [place] entrance Contact information: [place] [phone]  Allergies  Allergen Reactions  . Codeine Nausea Only, Other (See Comments) and Swelling    Other Reaction: nausea and vomiting Other reaction(s): NAUSEA   . Other Other (See Comments)    Other reaction(s): SWELLING  Other reaction(s): MUSCLE  PAIN Muscle aches Other reaction(s): MUSCLE PAIN Muscle aches Other reaction(s): MUSCLE PAIN Muscle aches   . Exenatide Nausea And Vomiting  . Levofloxacin Other (See Comments)    Other Reaction: sloughing of buccal mucosa Other reaction(s): OTHER Lost skin in mouth Patient states she can take Cipro without difficulty and has taken it many times in the past Other Reaction: sloughing of buccal mucosa   . Losartan Other (See Comments)    Weight gain Weight gain   . Tetracyclines & Related Other (See Comments)    Consultations:     Procedures/Studies: DG Chest 1 View  Result Date: 09/06/2019 CLINICAL DATA:  Shortness of breath and right leg swelling EXAM: CHEST  1 VIEW COMPARISON:  Radiograph 08/14/2019 FINDINGS: Hazy interstitial opacities are present within the lungs with some peripheral septal thickening and mild central vascular congestion. No focal consolidation. No pneumothorax or visible effusion. Prominent central pulmonary arteries may reflect some pulmonary artery hypertension, similar to comparison is. The aorta is calcified. The remaining cardiomediastinal contours are unremarkable. No acute osseous or soft tissue abnormality. Degenerative changes are present in the imaged spine and shoulders. Nasal cannula overlies the upper chest. IMPRESSION: Some hazy interstitial opacities with vascular congestion could reflect mild interstitial edema. Aortic Atherosclerosis (ICD10-I70.0). Mild central pulmonary artery prominence could reflect chronic pulmonary artery hypertension given presence on multiple comparisons. Electronically Signed   By: Lovena Le M.D.   On: 09/06/2019 19:10   DG Chest 2 View  Result Date: 10/02/2019 CLINICAL DATA:  79 year old female with shortness of breath and CHF. EXAM: CHEST - 2 VIEW COMPARISON:  Chest radiograph dated 09/06/2019. FINDINGS: There is left lung base hazy density with blunting of the left costophrenic angle which may be chronic or  represent small left pleural effusion and left lung base atelectasis. Pneumonia is not excluded clinical correlation is recommended. No focal consolidation or pneumothorax. The cardiac silhouette is within limits. Atherosclerotic calcification of the aorta. Degenerative changes of the spine and shoulders. No acute osseous pathology. IMPRESSION: Chronic changes versus small left pleural effusion and left lung base atelectasis. Pneumonia is not excluded. Electronically Signed   By: Anner Crete M.D.   On: 10/02/2019 21:29   US Venous Img Lower Unilateral Right (DVT)  Result Date: 10/03/2019 CLINICAL DATA:  Right lower extremity pain and edema for past 2-3 months. Evaluate for DVT. EXAM: RIGHT LOWER EXTREMITY VENOUS DOPPLER ULTRASOUND TECHNIQUE: Gray-scale sonography with graded compression, as well as color Doppler and duplex ultrasound were performed to evaluate the lower extremity deep venous systems from the level of the common femoral vein and including the common femoral, femoral, profunda femoral, popliteal and calf veins including the posterior tibial, peroneal and gastrocnemius veins when visible. The superficial great saphenous vein was also interrogated. Spectral Doppler was utilized to evaluate flow at rest and with distal augmentation maneuvers in the common femoral, femoral and popliteal veins. COMPARISON:  Right lower extremity venous Doppler ultrasound-09/07/2019; 07/19/2019 (both examinations were negative) FINDINGS: Contralateral Common Femoral Vein: Respiratory phasicity is normal and symmetric with the symptomatic side. No evidence of thrombus. Normal compressibility. Common Femoral Vein: No evidence of thrombus. Normal compressibility, respiratory phasicity and response to augmentation. Saphenofemoral Junction: No evidence of thrombus. Normal compressibility and flow on color Doppler  imaging. Profunda Femoral Vein: No evidence of thrombus. Normal compressibility and flow on color Doppler  imaging. Femoral Vein: No evidence of thrombus. Normal compressibility, respiratory phasicity and response to augmentation. Popliteal Vein: No evidence of thrombus. Normal compressibility, respiratory phasicity and response to augmentation. Calf Veins: Not well visualized. Superficial Great Saphenous Vein: No evidence of thrombus. Normal compressibility. Venous Reflux:  None. Other Findings:  None. IMPRESSION: No evidence of DVT within the right lower extremity. Electronically Signed   By: Sandi Mariscal M.D.   On: 10/03/2019 11:18   US Venous Img Lower Unilateral Right (DVT)  Result Date: 09/07/2019 CLINICAL DATA:  Lower extremity swelling. EXAM: RIGHT LOWER EXTREMITY VENOUS DOPPLER ULTRASOUND TECHNIQUE: Gray-scale sonography with compression, as well as color and duplex ultrasound, were performed to evaluate the deep venous system(s) from the level of the common femoral vein through the popliteal and proximal calf veins. COMPARISON:  07/09/2019. FINDINGS: VENOUS Normal compressibility of the common femoral, superficial femoral, and popliteal veins, as well as the visualized calf veins. Limited visualization of the peroneal veins. Visualized portions of profunda femoral vein and great saphenous vein unremarkable. No filling defects to suggest DVT on grayscale or color Doppler imaging. Doppler waveforms show normal direction of venous flow, normal respiratory plasticity and response to augmentation. Limited views of the contralateral common femoral vein are unremarkable. OTHER None. IMPRESSION: Negative exam.  No evidence of right lower extremity DVT. Electronically Signed   By: Marcello Moores  Register   On: 09/07/2019 12:14       Subjective: Pt denies any complaints other than wanted to leave the hospital    Discharge Exam: Vitals:   10/04/19 0439 10/04/19 0813  BP: (!) 129/44   Pulse: 63 69  Resp: 16   Temp: 97.8 F (36.6 C)   SpO2: 97% 95%   Vitals:   10/03/19 1934 10/03/19 1955 10/04/19 0439  10/04/19 0813  BP:  (!) 136/46 (!) 129/44   Pulse:  80 63 69  Resp:   16   Temp:  98.7 F (37.1 C) 97.8 F (36.6 C)   TempSrc:  Oral Oral   SpO2: 93% 93% 97% 95%  Weight:      Height:        General: Pt is alert, awake, not in acute distress Cardiovascular: S1/S2 +, no rubs, no gallops Respiratory: diminished breath sounds b/l, no wheezing, no rhonchi Abdominal: Soft, NT, ND, bowel sounds + Extremities:  no cyanosis    The results of significant diagnostics from this hospitalization (including imaging, microbiology, ancillary and laboratory) are listed below for reference.     Microbiology: Recent Results (from the past 240 hour(s))  SARS Coronavirus 2 by RT PCR (hospital order, performed in [facility] hospital lab) Nasopharyngeal Nasopharyngeal Swab     Status: None   Collection Time: 10/03/19  4:38 AM   Specimen: Nasopharyngeal Swab  Result Value Ref Range Status   SARS Coronavirus 2 NEGATIVE NEGATIVE Final    Comment: (NOTE) SARS-CoV-2 target nucleic acids are NOT DETECTED.  The SARS-CoV-2 RNA is generally detectable in upper and lower respiratory specimens during the acute phase of infection. The lowest concentration of SARS-CoV-2 viral copies this assay can detect is 250 copies / mL. A negative result does not preclude SARS-CoV-2 infection and should not be used as the sole basis for treatment or other patient management decisions.  A negative result may occur with improper specimen collection / handling, submission of specimen other than nasopharyngeal swab, presence of viral mutation(s) within the  areas targeted by this assay, and inadequate number of viral copies (<250 copies / mL). A negative result must be combined with clinical observations, patient history, and epidemiological information.  Fact Sheet for Patients:   StrictlyIdeas.no  Fact Sheet for Healthcare Providers: BankingDealers.co.za  This test is  not yet approved or  cleared by the [place] FDA and has been authorized for detection and/or diagnosis of SARS-CoV-2 by FDA under an Emergency Use Authorization (EUA).  This EUA will remain in effect (meaning this test can be used) for the duration of the COVID-19 declaration under Section 564(b)(1) of the Act, 21 U.S.C. section 360bbb-3(b)(1), unless the authorization is terminated or revoked sooner.  Performed at [facility], [address] [place], [place] [address]      Labs: BNP (last 3 results) Recent Labs    08/14/19 2327 09/06/19 1840 10/02/19 2057  BNP 112.7* 171.6* 177.9*   Basic Metabolic Panel: Recent Labs  Lab 10/02/19 2057  NA 139  K 5.2*  CL 100  CO2 30  GLUCOSE 109*  BUN 39*  CREATININE 1.22*  CALCIUM 8.5*   Liver Function Tests: No results for input(s): AST, ALT, ALKPHOS, BILITOT, PROT, ALBUMIN in the last 168 hours. No results for input(s): LIPASE, AMYLASE in the last 168 hours. No results for input(s): AMMONIA in the last 168 hours. CBC: Recent Labs  Lab 10/02/19 2057  WBC 8.9  HGB 10.6*  HCT 36.5  MCV 72.1*  PLT 310   Cardiac Enzymes: No results for input(s): CKTOTAL, CKMB, CKMBINDEX, TROPONINI in the last 168 hours. BNP: Invalid input(s): POCBNP CBG: Recent Labs  Lab 10/03/19 0821 10/03/19 1157 10/03/19 1724 10/03/19 2111 10/04/19 0842  GLUCAP 206* 331* 232* 165* 108*   D-Dimer No results for input(s): DDIMER in the last 72 hours. Hgb A1c Recent Labs    10/03/19 0933  HGBA1C 8.0*   Lipid Profile No results for input(s): CHOL, HDL, LDLCALC, TRIG, CHOLHDL, LDLDIRECT in the last 72 hours. Thyroid function studies No results for input(s): TSH, T4TOTAL, T3FREE, THYROIDAB in the last 72 hours.  Invalid input(s): FREET3 Anemia work up No results for input(s): VITAMINB12, FOLATE, FERRITIN, TIBC, IRON, RETICCTPCT in the last 72 hours. Urinalysis    Component Value Date/Time   COLORURINE STRAW (A)  07/30/2019 2204   APPEARANCEUR CLEAR (A) 07/30/2019 2204   LABSPEC 1.009 07/30/2019 2204   PHURINE 5.0 07/30/2019 [place] 07/30/2019 2204   HGBUR NEGATIVE 07/30/2019 2204   BILIRUBINUR NEGATIVE 07/30/2019 2204   KETONESUR NEGATIVE 07/30/2019 2204   PROTEINUR 100 (A) 07/30/2019 2204   NITRITE NEGATIVE 07/30/2019 2204   LEUKOCYTESUR NEGATIVE 07/30/2019 2204   Sepsis Labs Invalid input(s): PROCALCITONIN,  WBC,  LACTICIDVEN Microbiology Recent Results (from the past 240 hour(s))  SARS Coronavirus 2 by RT PCR (hospital order, performed in [place] hospital lab) Nasopharyngeal Nasopharyngeal Swab     Status: None   Collection Time: 10/03/19  4:38 AM   Specimen: Nasopharyngeal Swab  Result Value Ref Range Status   SARS Coronavirus 2 NEGATIVE NEGATIVE Final    Comment: (NOTE) SARS-CoV-2 target nucleic acids are NOT DETECTED.  The SARS-CoV-2 RNA is generally detectable in upper and lower respiratory specimens during the acute phase of infection. The lowest concentration of SARS-CoV-2 viral copies this assay can detect is 250 copies / mL. A negative result does not preclude SARS-CoV-2 infection and should not be used as the sole basis for treatment or other patient management decisions.  A negative result  may occur with improper specimen collection / handling, submission of specimen other than nasopharyngeal swab, presence of viral mutation(s) within the areas targeted by this assay, and inadequate number of viral copies (<250 copies / mL). A negative result must be combined with clinical observations, patient history, and epidemiological information.  Fact Sheet for Patients:   StrictlyIdeas.no  Fact Sheet for Healthcare Providers: BankingDealers.co.za  This test is not yet approved or  cleared by the [place] FDA and has been authorized for detection and/or diagnosis of SARS-CoV-2 by FDA under an Emergency Use  Authorization (EUA).  This EUA will remain in effect (meaning this test can be used) for the duration of the COVID-19 declaration under Section 564(b)(1) of the Act, 21 U.S.C. section 360bbb-3(b)(1), unless the authorization is terminated or revoked sooner.  Performed at [facility], [address] [place], [place] [address]      Time coordinating discharge: Over 30 minutes  SIGNED:   Wyvonnia Dusky, MD  [facility] 10/04/2019, 1:41 PM Pager   If 7PM-7AM, please contact night-coverage [email or web address]

## 2019-10-05 ENCOUNTER — Telehealth: Payer: Self-pay | Admitting: Family

## 2019-10-05 NOTE — Telephone Encounter (Signed)
Unable to reach patient regarding her leaving hospital AMA on 7/14 and to see how patient is doing. She has a follow up appointment with [place] on 8/17 at [place], [place]

## 2019-10-09 ENCOUNTER — Emergency Department: Payer: Medicare Other

## 2019-10-09 ENCOUNTER — Other Ambulatory Visit: Payer: Self-pay

## 2019-10-09 ENCOUNTER — Telehealth (HOSPITAL_COMMUNITY): Payer: Self-pay

## 2019-10-09 DIAGNOSIS — Z881 Allergy status to other antibiotic agents status: Secondary | ICD-10-CM

## 2019-10-09 DIAGNOSIS — Z85038 Personal history of other malignant neoplasm of large intestine: Secondary | ICD-10-CM

## 2019-10-09 DIAGNOSIS — J441 Chronic obstructive pulmonary disease with (acute) exacerbation: Secondary | ICD-10-CM | POA: Diagnosis not present

## 2019-10-09 DIAGNOSIS — D72829 Elevated white blood cell count, unspecified: Secondary | ICD-10-CM | POA: Diagnosis present

## 2019-10-09 DIAGNOSIS — Z794 Long term (current) use of insulin: Secondary | ICD-10-CM

## 2019-10-09 DIAGNOSIS — Z801 Family history of malignant neoplasm of trachea, bronchus and lung: Secondary | ICD-10-CM

## 2019-10-09 DIAGNOSIS — D631 Anemia in chronic kidney disease: Secondary | ICD-10-CM | POA: Diagnosis present

## 2019-10-09 DIAGNOSIS — Z79899 Other long term (current) drug therapy: Secondary | ICD-10-CM

## 2019-10-09 DIAGNOSIS — R197 Diarrhea, unspecified: Secondary | ICD-10-CM | POA: Diagnosis present

## 2019-10-09 DIAGNOSIS — Z9049 Acquired absence of other specified parts of digestive tract: Secondary | ICD-10-CM

## 2019-10-09 DIAGNOSIS — I130 Hypertensive heart and chronic kidney disease with heart failure and stage 1 through stage 4 chronic kidney disease, or unspecified chronic kidney disease: Secondary | ICD-10-CM | POA: Diagnosis present

## 2019-10-09 DIAGNOSIS — M109 Gout, unspecified: Secondary | ICD-10-CM | POA: Diagnosis present

## 2019-10-09 DIAGNOSIS — Z6835 Body mass index (BMI) 35.0-35.9, adult: Secondary | ICD-10-CM

## 2019-10-09 DIAGNOSIS — Z5329 Procedure and treatment not carried out because of patient's decision for other reasons: Secondary | ICD-10-CM | POA: Diagnosis not present

## 2019-10-09 DIAGNOSIS — N1831 Chronic kidney disease, stage 3a: Secondary | ICD-10-CM | POA: Diagnosis present

## 2019-10-09 DIAGNOSIS — I5032 Chronic diastolic (congestive) heart failure: Secondary | ICD-10-CM | POA: Diagnosis present

## 2019-10-09 DIAGNOSIS — J9621 Acute and chronic respiratory failure with hypoxia: Secondary | ICD-10-CM | POA: Diagnosis present

## 2019-10-09 DIAGNOSIS — Z885 Allergy status to narcotic agent status: Secondary | ICD-10-CM

## 2019-10-09 DIAGNOSIS — E1122 Type 2 diabetes mellitus with diabetic chronic kidney disease: Secondary | ICD-10-CM | POA: Diagnosis present

## 2019-10-09 DIAGNOSIS — E669 Obesity, unspecified: Secondary | ICD-10-CM | POA: Diagnosis present

## 2019-10-09 DIAGNOSIS — Z20822 Contact with and (suspected) exposure to covid-19: Secondary | ICD-10-CM | POA: Diagnosis present

## 2019-10-09 NOTE — Telephone Encounter (Signed)
Today Karla Sanchez contacted me and stated she believes she will get better care in [place].  She advised that she will drive her [name] there.  She said she could visit her doctors and get better help.  She states she will call me and let me know when she leaves.   [place] [phone]

## 2019-10-09 NOTE — ED Triage Notes (Signed)
Pt arrives to ED via [facility] from her residence at the [facility] with c/o [facility] since 10pm. Pt reports h/x of COPD; states she "used" to require home O2, but hasn't "in a long time". Pt denies N/V/D or fever. Pt is A&O, in NAD; RR even, regular, and unlabored.

## 2019-10-10 ENCOUNTER — Encounter: Payer: Self-pay | Admitting: Internal Medicine

## 2019-10-10 ENCOUNTER — Inpatient Hospital Stay
Admission: EM | Admit: 2019-10-10 | Discharge: 2019-10-10 | DRG: 190 | Discharge status: Against Medical Advice | Payer: Medicare Other | Attending: Internal Medicine | Admitting: Internal Medicine

## 2019-10-10 ENCOUNTER — Emergency Department: Payer: Medicare Other

## 2019-10-10 DIAGNOSIS — D638 Anemia in other chronic diseases classified elsewhere: Secondary | ICD-10-CM | POA: Diagnosis not present

## 2019-10-10 DIAGNOSIS — N1831 Chronic kidney disease, stage 3a: Secondary | ICD-10-CM | POA: Diagnosis present

## 2019-10-10 DIAGNOSIS — Z5329 Procedure and treatment not carried out because of patient's decision for other reasons: Secondary | ICD-10-CM | POA: Diagnosis not present

## 2019-10-10 DIAGNOSIS — Z9049 Acquired absence of other specified parts of digestive tract: Secondary | ICD-10-CM | POA: Diagnosis not present

## 2019-10-10 DIAGNOSIS — J9621 Acute and chronic respiratory failure with hypoxia: Secondary | ICD-10-CM | POA: Diagnosis present

## 2019-10-10 DIAGNOSIS — J9601 Acute respiratory failure with hypoxia: Secondary | ICD-10-CM

## 2019-10-10 DIAGNOSIS — J441 Chronic obstructive pulmonary disease with (acute) exacerbation: Secondary | ICD-10-CM | POA: Diagnosis present

## 2019-10-10 DIAGNOSIS — Z801 Family history of malignant neoplasm of trachea, bronchus and lung: Secondary | ICD-10-CM | POA: Diagnosis not present

## 2019-10-10 DIAGNOSIS — Z794 Long term (current) use of insulin: Secondary | ICD-10-CM | POA: Diagnosis not present

## 2019-10-10 DIAGNOSIS — I10 Essential (primary) hypertension: Secondary | ICD-10-CM | POA: Diagnosis not present

## 2019-10-10 DIAGNOSIS — E669 Obesity, unspecified: Secondary | ICD-10-CM | POA: Diagnosis present

## 2019-10-10 DIAGNOSIS — E1122 Type 2 diabetes mellitus with diabetic chronic kidney disease: Secondary | ICD-10-CM | POA: Diagnosis present

## 2019-10-10 DIAGNOSIS — I130 Hypertensive heart and chronic kidney disease with heart failure and stage 1 through stage 4 chronic kidney disease, or unspecified chronic kidney disease: Secondary | ICD-10-CM | POA: Diagnosis present

## 2019-10-10 DIAGNOSIS — R0602 Shortness of breath: Secondary | ICD-10-CM

## 2019-10-10 DIAGNOSIS — R197 Diarrhea, unspecified: Secondary | ICD-10-CM | POA: Diagnosis present

## 2019-10-10 DIAGNOSIS — D72829 Elevated white blood cell count, unspecified: Secondary | ICD-10-CM | POA: Diagnosis present

## 2019-10-10 DIAGNOSIS — Z6835 Body mass index (BMI) 35.0-35.9, adult: Secondary | ICD-10-CM | POA: Diagnosis not present

## 2019-10-10 DIAGNOSIS — D631 Anemia in chronic kidney disease: Secondary | ICD-10-CM | POA: Diagnosis present

## 2019-10-10 DIAGNOSIS — Z20822 Contact with and (suspected) exposure to covid-19: Secondary | ICD-10-CM | POA: Diagnosis present

## 2019-10-10 DIAGNOSIS — E119 Type 2 diabetes mellitus without complications: Secondary | ICD-10-CM | POA: Diagnosis present

## 2019-10-10 DIAGNOSIS — M109 Gout, unspecified: Secondary | ICD-10-CM | POA: Diagnosis present

## 2019-10-10 DIAGNOSIS — I5032 Chronic diastolic (congestive) heart failure: Secondary | ICD-10-CM | POA: Diagnosis present

## 2019-10-10 DIAGNOSIS — Z881 Allergy status to other antibiotic agents status: Secondary | ICD-10-CM | POA: Diagnosis not present

## 2019-10-10 DIAGNOSIS — Z79899 Other long term (current) drug therapy: Secondary | ICD-10-CM | POA: Diagnosis not present

## 2019-10-10 DIAGNOSIS — N1832 Chronic kidney disease, stage 3b: Secondary | ICD-10-CM | POA: Diagnosis present

## 2019-10-10 DIAGNOSIS — Z85038 Personal history of other malignant neoplasm of large intestine: Secondary | ICD-10-CM | POA: Diagnosis not present

## 2019-10-10 DIAGNOSIS — Z885 Allergy status to narcotic agent status: Secondary | ICD-10-CM | POA: Diagnosis not present

## 2019-10-10 LAB — COMPREHENSIVE METABOLIC PANEL
ALT: 13 U/L (ref 0–44)
AST: 14 U/L — ABNORMAL LOW (ref 15–41)
Albumin: 3.2 g/dL — ABNORMAL LOW (ref 3.5–5.0)
Alkaline Phosphatase: 95 U/L (ref 38–126)
Anion gap: 7 (ref 5–15)
BUN: 27 mg/dL — ABNORMAL HIGH (ref 8–23)
CO2: 31 mmol/L (ref 22–32)
Calcium: 8.3 mg/dL — ABNORMAL LOW (ref 8.9–10.3)
Chloride: 103 mmol/L (ref 98–111)
Creatinine, Ser: 1.21 mg/dL — ABNORMAL HIGH (ref 0.44–1.00)
GFR calc Af Amer: 49 mL/min — ABNORMAL LOW (ref >60)
GFR calc non Af Amer: 43 mL/min — ABNORMAL LOW (ref >60)
Glucose, Bld: 73 mg/dL (ref 70–99)
Potassium: 4.4 mmol/L (ref 3.5–5.1)
Sodium: 141 mmol/L (ref 135–145)
Total Bilirubin: 0.5 mg/dL (ref 0.3–1.2)
Total Protein: 6.3 g/dL — ABNORMAL LOW (ref 6.5–8.1)

## 2019-10-10 LAB — BRAIN NATRIURETIC PEPTIDE: B Natriuretic Peptide: 133.2 pg/mL — ABNORMAL HIGH (ref 0.0–100.0)

## 2019-10-10 LAB — CBC WITH DIFFERENTIAL/PLATELET
Abs Immature Granulocytes: 0.04 10*3/uL (ref 0.00–0.07)
Basophils Absolute: 0 10*3/uL (ref 0.0–0.1)
Basophils Relative: 0 %
Eosinophils Absolute: 1 10*3/uL — ABNORMAL HIGH (ref 0.0–0.5)
Eosinophils Relative: 8 %
HCT: 39.7 % (ref 36.0–46.0)
Hemoglobin: 11.2 g/dL — ABNORMAL LOW (ref 12.0–15.0)
Immature Granulocytes: 0 %
Lymphocytes Relative: 28 %
Lymphs Abs: 3.6 10*3/uL (ref 0.7–4.0)
MCH: 20.7 pg — ABNORMAL LOW (ref 26.0–34.0)
MCHC: 28.2 g/dL — ABNORMAL LOW (ref 30.0–36.0)
MCV: 73.5 fL — ABNORMAL LOW (ref 80.0–100.0)
Monocytes Absolute: 0.9 10*3/uL (ref 0.1–1.0)
Monocytes Relative: 7 %
Neutro Abs: 7.2 10*3/uL (ref 1.7–7.7)
Neutrophils Relative %: 57 %
Platelets: 336 10*3/uL (ref 150–400)
RBC: 5.4 MIL/uL — ABNORMAL HIGH (ref 3.87–5.11)
RDW: 21.4 % — ABNORMAL HIGH (ref 11.5–15.5)
WBC: 12.7 10*3/uL — ABNORMAL HIGH (ref 4.0–10.5)
nRBC: 0 % (ref 0.0–0.2)

## 2019-10-10 LAB — GLUCOSE, CAPILLARY: Glucose-Capillary: 119 mg/dL — ABNORMAL HIGH (ref 70–99)

## 2019-10-10 LAB — SARS CORONAVIRUS 2 BY RT PCR (HOSPITAL ORDER, PERFORMED IN ~~LOC~~ HOSPITAL LAB): SARS Coronavirus 2: NEGATIVE

## 2019-10-10 LAB — TROPONIN I (HIGH SENSITIVITY): Troponin I (High Sensitivity): 13 ng/L (ref <18)

## 2019-10-10 MED ORDER — ONDANSETRON HCL 4 MG/2ML IJ SOLN: 4.0000 mg | Freq: Three times a day (TID) | INTRAMUSCULAR | Status: DC | PRN

## 2019-10-10 MED ORDER — METHYLPREDNISOLONE SODIUM SUCC 40 MG IJ SOLR: 40.0000 mg | Freq: Two times a day (BID) | INTRAMUSCULAR | Status: DC

## 2019-10-10 MED ORDER — IPRATROPIUM-ALBUTEROL 0.5-2.5 (3) MG/3ML IN SOLN: 3.0000 mL | RESPIRATORY_TRACT | Status: DC

## 2019-10-10 MED ORDER — ENOXAPARIN SODIUM 40 MG/0.4ML ~~LOC~~ SOLN: 40.0000 mg | SUBCUTANEOUS | Status: DC

## 2019-10-10 MED ORDER — IPRATROPIUM-ALBUTEROL 0.5-2.5 (3) MG/3ML IN SOLN
3.0000 mL | Freq: Once | RESPIRATORY_TRACT | Status: AC
  Administered 2019-10-10: 3 mL via RESPIRATORY_TRACT
  Filled 2019-10-10: qty 3

## 2019-10-10 MED ORDER — AZITHROMYCIN 500 MG PO TABS
500.0000 mg | ORAL_TABLET | Freq: Once | ORAL | Status: AC
  Administered 2019-10-10: 500 mg via ORAL
  Filled 2019-10-10: qty 1

## 2019-10-10 MED ORDER — DM-GUAIFENESIN ER 30-600 MG PO TB12: 1.0000 | ORAL_TABLET | Freq: Two times a day (BID) | ORAL | Status: DC | PRN

## 2019-10-10 MED ORDER — PREDNISONE 20 MG PO TABS: 60.0000 mg | ORAL_TABLET | Freq: Two times a day (BID) | ORAL | Status: DC

## 2019-10-10 MED ORDER — HYDRALAZINE HCL 20 MG/ML IJ SOLN: 5.0000 mg | INTRAMUSCULAR | Status: DC | PRN

## 2019-10-10 MED ORDER — AZITHROMYCIN 250 MG PO TABS: 250.0000 mg | ORAL_TABLET | Freq: Every day | ORAL | Status: DC

## 2019-10-10 MED ORDER — FUROSEMIDE 40 MG PO TABS
60.0000 mg | ORAL_TABLET | Freq: Once | ORAL | Status: AC
  Administered 2019-10-10: 60 mg via ORAL
  Filled 2019-10-10: qty 2

## 2019-10-10 MED ORDER — INSULIN ASPART 100 UNIT/ML ~~LOC~~ SOLN: 0.0000 [IU] | SUBCUTANEOUS | Status: DC

## 2019-10-10 MED ORDER — PREDNISONE 20 MG PO TABS
60.0000 mg | ORAL_TABLET | Freq: Once | ORAL | Status: AC
  Administered 2019-10-10: 60 mg via ORAL
  Filled 2019-10-10: qty 3

## 2019-10-10 MED ORDER — ACETAMINOPHEN 325 MG PO TABS: 650.0000 mg | ORAL_TABLET | Freq: Four times a day (QID) | ORAL | Status: DC | PRN

## 2019-10-10 MED ORDER — ALBUTEROL SULFATE (2.5 MG/3ML) 0.083% IN NEBU: 2.5000 mg | INHALATION_SOLUTION | RESPIRATORY_TRACT | Status: DC | PRN

## 2019-10-10 NOTE — ED Notes (Signed)
Pt insistent on leaving and not being admitted. Charge Animal nutritionist informed. Pt states she needs EMS transport back to [facility]. Secretary informed pt in need of EMS transport.

## 2019-10-10 NOTE — ED Notes (Signed)
This RN to bedside to start IV. Pt refusing IV access and blood draws. Pt states "I am not sticking a needle in my arm". I advised patient she needed IV steroids and IV abx and she states "I can take the steroids by mouth and be just fine, I have been doing it for years, and you tell that doctor that I aint doing it and not to come down here trying to get me to". Advised pt again that we needed additional labwork and she needs IV steroids for lung inflammation and patient continues to refuse. Dr. Blaine Hamper informed.

## 2019-10-10 NOTE — ED Notes (Signed)
This RN to bedside to answer call light. Pt sitting in chair and states she urinated in the floor. Advised pt I would need help to stand her up because she states she cant stand on her own. Pt states, "I dont want to stand, just let me be wet". Advised pt this was not sanitary and could cause skin breakdown. Pt assisted back to bed by this RN and tech Tracey, pt able to pivot with assistance back to bed. Pt cleaned up and floor cleaned of urine.

## 2019-10-10 NOTE — TOC Transition Note (Addendum)
Transition of Care [facility]) - CM/SW Discharge Note   Patient Details  Name: Karla Sanchez MRN: [id number] Date of Birth: 02-18-1941  Transition of Care [facility]) CM/SW Contact:  Anselm Pancoast, RN Phone Number: 10/10/2019, 1:59 PM   Clinical Narrative:    Corene Cornea with [facility] is unable to accept patient.   Outreach to [employer] -confirmed availability for services including PT and aide.   Notified by Dr. Blaine Hamper no home health orders will be started due to patient leaving AMA.      Barriers to Discharge: Continued Medical Work up   Patient Goals and CMS Choice Patient states their goals for this hospitalization and ongoing recovery are:: want to go to [facility]      Discharge Placement                       Discharge Plan and Services In-house Referral: Clinical Social Work Discharge Planning Services: Mobile Meals Post Acute Care Choice: Home Health                               Social Determinants of Health (SDOH) Interventions     Readmission Risk Interventions No flowsheet data found.

## 2019-10-10 NOTE — ED Notes (Addendum)
Pt incont of urine; refuses to be cleaned up or have linen changed; requests to "just put a chux underneath"; pt refusing vs, card monitoring or IV placement at this time--MD notified of such; pt with pulse ox 88% on RA; pt replaces O2 at 3l/min via [place] after encouragement

## 2019-10-10 NOTE — Discharge Summary (Signed)
Physician Discharge Summary  Karla Sanchez KNL:[id number] DOB: 14-Aug-1940 DOA: 10/10/2019  PCP: Alfredia Client, MD  Admit date: 10/10/2019 Discharge date: 10/10/2019  Recommendations for Outpatient Follow-up:  -none since pt left on [place]: none Equipment/Devices: none   Discharge Condition:stable CODE STATUS: full Diet recommendation: heart/carb diet  Brief/Interim Summary (HPI)  Karla Sanchez is a 79 y.o. female with medical history significant of hypertension, diabetes mellitus, COPD, gout, colon cancer (s/p of her right and left colectomy 2020), dCHF, CKD stage IIIa, anemia, who presents with shortness of breath.  Patient states for shortness breath started yesterday, which has been progressively worsening.  She has chronic cough with little mucus production.  Patient denies chest pain, fever or chills. She states that she used to use oxygen at home, but recently she did not need oxygen at home. She endorses compliance with her Lasix and her inhalers. Patient has asymmetric swelling of her right lower extremity which is chronicsince April. She has had 3 venous Doppler ultrasounds which were all negative for DVT.  Patient has diarrhea in the past 2 days.  She has had 3-4 times of watery diarrhea each day.  Denies nausea, vomiting or abdominal pain.  No symptoms of UTI or unilateral weakness. Pt was found to have oxygen desaturation to 88% on room air, which improved her to 94% on 3 L nasal cannula oxygen.  ED Course: pt was found to have WBC 12.7, troponin 13, BNP 133, negative COVID-19 PCR, stable renal function, temperature normal, blood pressure 146/65, heart rate 58, RR 19, chest x-ray negative.  Patient is admitted to [place] bed as inpatient by accepting MD.    Subjective  - SOB and cough  Discharge Diagnoses and Hospital Course:   Principal Problem:   COPD exacerbation ([place]) Active Problems:   Chronic diastolic CHF (congestive heart failure)  (HCC)   Type 2 diabetes mellitus with stage 3 chronic kidney disease (HCC)   Gout   Anemia of chronic disease   Hypertension   CKD (chronic kidney disease), stage IIIa   Acute on chronic respiratory failure with hypoxia (HCC)   Diarrhea   Acute on chronic respiratory failure with hypoxia due to COPD exacerbation [facility]): Patient has cough, decreased air movement and mild wheezing on auscultation, clinically consistent service COPD exacerbation.  -admitted to Med-surg bed as inpatient -Bronchodilators -prednisone 60 mg was given in ED -->will continue bid -Azithromycin 500 mg IV was given in ED, will continue 250 mg daily orally -Mucinex for cough  -Incentive spirometry -Follow up blood culture x2, sputum culture -Nasal cannula oxygen as neededto maintain O2 saturation 93% or greater -PT/OT -Consulted transition care team -Unfortunately patient left hospital AMA  Chronic diastolic CHF (congestive heart failure) ([place]): 2D echo on 07/11/2019 showed EF of 55 to 60%.  Patient has trace leg edema in the left leg and chronic asymmetrical right leg edema.  BNP 133.  No pulmonary edema chest x-ray.  Clinically CHF seem to be compensated.  Patient received 60 mg of Lasix in the ED. -Will continue home dose Lasix  Type 2 diabetes mellitus with stage 3 chronic kidney disease ([place]): Most recent A1c 8.0 , poorly controled. Patient is taking Lantus at home -decreased Lantus dose from 60 to 30 unit daily -SSI  Gout -Continued colchicine  Anemia of chronic disease: Normocytic anemia.  Hemoglobin stable 11.2 -Follow-up with CBC  Hypertension -IV hydralazine as needed -Continued metoprolol -Patient is also on Lasix  CKD (chronic kidney disease), stage IIIa: Stable -  Follow-up of BMP  Diarrhea: Etiology is not clear.  No nausea, vomiting, abdominal pain.  No fever or chills.  P -Follow-up C. difficile PCR and GI pathogen panel -Supportive care -As needed Zofran     Discharge  Instructions     Allergies  Allergen Reactions  . Codeine Nausea Only, Other (See Comments) and Swelling    Other Reaction: nausea and vomiting Other reaction(s): NAUSEA   . Other Other (See Comments)    Other reaction(s): SWELLING  Other reaction(s): MUSCLE PAIN Muscle aches Other reaction(s): MUSCLE PAIN Muscle aches Other reaction(s): MUSCLE PAIN Muscle aches   . Exenatide Nausea And Vomiting  . Levofloxacin Other (See Comments)    Other Reaction: sloughing of buccal mucosa Other reaction(s): OTHER Lost skin in mouth Patient states she can take Cipro without difficulty and has taken it many times in the past Other Reaction: sloughing of buccal mucosa   . Losartan Other (See Comments)    Weight gain Weight gain   . Tetracyclines & Related Other (See Comments)    Consultations:  none   Procedures/Studies: DG Chest 1 View  Result Date: 10/10/2019 CLINICAL DATA:  Shortness of breath.  History of COPD. EXAM: CHEST  1 VIEW COMPARISON:  10/02/2019 FINDINGS: The cardiac silhouette remains mildly enlarged. There is chronic enlargement of the central pulmonary arteries which may reflect underlying pulmonary arterial hypertension. Chronic accentuation of the interstitial markings is unchanged without overt pulmonary edema. No confluent airspace opacity, sizeable pleural effusion, pneumothorax is identified. No acute osseous abnormality is seen. IMPRESSION: Chronic findings without evidence of an acute cardiopulmonary process. Electronically Signed   By: Logan Bores M.D.   On: 10/10/2019 04:26   DG Chest 2 View  Result Date: 10/02/2019 CLINICAL DATA:  79 year old female with shortness of breath and CHF. EXAM: CHEST - 2 VIEW COMPARISON:  Chest radiograph dated 09/06/2019. FINDINGS: There is left lung base hazy density with blunting of the left costophrenic angle which may be chronic or represent small left pleural effusion and left lung base atelectasis. Pneumonia is not  excluded clinical correlation is recommended. No focal consolidation or pneumothorax. The cardiac silhouette is within limits. Atherosclerotic calcification of the aorta. Degenerative changes of the spine and shoulders. No acute osseous pathology. IMPRESSION: Chronic changes versus small left pleural effusion and left lung base atelectasis. Pneumonia is not excluded. Electronically Signed   By: Anner Crete M.D.   On: 10/02/2019 21:29   US Venous Img Lower Unilateral Right (DVT)  Result Date: 10/03/2019 CLINICAL DATA:  Right lower extremity pain and edema for past 2-3 months. Evaluate for DVT. EXAM: RIGHT LOWER EXTREMITY VENOUS DOPPLER ULTRASOUND TECHNIQUE: Gray-scale sonography with graded compression, as well as color Doppler and duplex ultrasound were performed to evaluate the lower extremity deep venous systems from the level of the common femoral vein and including the common femoral, femoral, profunda femoral, popliteal and calf veins including the posterior tibial, peroneal and gastrocnemius veins when visible. The superficial great saphenous vein was also interrogated. Spectral Doppler was utilized to evaluate flow at rest and with distal augmentation maneuvers in the common femoral, femoral and popliteal veins. COMPARISON:  Right lower extremity venous Doppler ultrasound-09/07/2019; 07/19/2019 (both examinations were negative) FINDINGS: Contralateral Common Femoral Vein: Respiratory phasicity is normal and symmetric with the symptomatic side. No evidence of thrombus. Normal compressibility. Common Femoral Vein: No evidence of thrombus. Normal compressibility, respiratory phasicity and response to augmentation. Saphenofemoral Junction: No evidence of thrombus. Normal compressibility and flow on color Doppler imaging.  Profunda Femoral Vein: No evidence of thrombus. Normal compressibility and flow on color Doppler imaging. Femoral Vein: No evidence of thrombus. Normal compressibility, respiratory  phasicity and response to augmentation. Popliteal Vein: No evidence of thrombus. Normal compressibility, respiratory phasicity and response to augmentation. Calf Veins: Not well visualized. Superficial Great Saphenous Vein: No evidence of thrombus. Normal compressibility. Venous Reflux:  None. Other Findings:  None. IMPRESSION: No evidence of DVT within the right lower extremity. Electronically Signed   By: Sandi Mariscal M.D.   On: 10/03/2019 11:18       Discharge Exam: Vitals:   10/10/19 0600 10/10/19 1411  BP: (!) 146/65 (!) 134/59  Pulse: 93 (!) 101  Resp: 19 (!) 26  Temp: 97.6 F (36.4 C) 97.8 F (36.6 C)  SpO2: 94% 96%   Vitals:   10/10/19 0500 10/10/19 0551 10/10/19 0600 10/10/19 1411  BP: 132/76  (!) 146/65 (!) 134/59  Pulse:   93 (!) 101  Resp:  18 19 (!) 26  Temp:  98 F (36.7 C) 97.6 F (36.4 C) 97.8 F (36.6 C)  TempSrc:  Oral Oral Oral  SpO2: 97%  94% 96%  Weight:      Height:        Physical examination was not performed since patient left hospital AMA, I did not have chance to see her.   The results of significant diagnostics from this hospitalization (including imaging, microbiology, ancillary and laboratory) are listed below for reference.     Microbiology: Recent Results (from the past 240 hour(s))  SARS Coronavirus 2 by RT PCR (hospital order, performed in [facility] hospital lab) Nasopharyngeal Nasopharyngeal Swab     Status: None   Collection Time: 10/03/19  4:38 AM   Specimen: Nasopharyngeal Swab  Result Value Ref Range Status   SARS Coronavirus 2 NEGATIVE NEGATIVE Final    Comment: (NOTE) SARS-CoV-2 target nucleic acids are NOT DETECTED.  The SARS-CoV-2 RNA is generally detectable in upper and lower respiratory specimens during the acute phase of infection. The lowest concentration of SARS-CoV-2 viral copies this assay can detect is 250 copies / mL. A negative result does not preclude SARS-CoV-2 infection and should not be used as the sole  basis for treatment or other patient management decisions.  A negative result may occur with improper specimen collection / handling, submission of specimen other than nasopharyngeal swab, presence of viral mutation(s) within the areas targeted by this assay, and inadequate number of viral copies (<250 copies / mL). A negative result must be combined with clinical observations, patient history, and epidemiological information.  Fact Sheet for Patients:   StrictlyIdeas.no  Fact Sheet for Healthcare Providers: BankingDealers.co.za  This test is not yet approved or  cleared by the [place] FDA and has been authorized for detection and/or diagnosis of SARS-CoV-2 by FDA under an Emergency Use Authorization (EUA).  This EUA will remain in effect (meaning this test can be used) for the duration of the COVID-19 declaration under Section 564(b)(1) of the Act, 21 U.S.C. section 360bbb-3(b)(1), unless the authorization is terminated or revoked sooner.  Performed at [facility], [address] [place], [place] [address]   SARS Coronavirus 2 by RT PCR (hospital order, performed in [facility] hospital lab) Nasopharyngeal Nasopharyngeal Swab     Status: None   Collection Time: 10/10/19  5:27 AM   Specimen: Nasopharyngeal Swab  Result Value Ref Range Status   SARS Coronavirus 2 NEGATIVE NEGATIVE Final    Comment: (NOTE) SARS-CoV-2 target nucleic  acids are NOT DETECTED.  The SARS-CoV-2 RNA is generally detectable in upper and lower respiratory specimens during the acute phase of infection. The lowest concentration of SARS-CoV-2 viral copies this assay can detect is 250 copies / mL. A negative result does not preclude SARS-CoV-2 infection and should not be used as the sole basis for treatment or other patient management decisions.  A negative result may occur with improper specimen collection / handling, submission of specimen  other than nasopharyngeal swab, presence of viral mutation(s) within the areas targeted by this assay, and inadequate number of viral copies (<250 copies / mL). A negative result must be combined with clinical observations, patient history, and epidemiological information.  Fact Sheet for Patients:   StrictlyIdeas.no  Fact Sheet for Healthcare Providers: BankingDealers.co.za  This test is not yet approved or  cleared by the [place] FDA and has been authorized for detection and/or diagnosis of SARS-CoV-2 by FDA under an Emergency Use Authorization (EUA).  This EUA will remain in effect (meaning this test can be used) for the duration of the COVID-19 declaration under Section 564(b)(1) of the Act, 21 U.S.C. section 360bbb-3(b)(1), unless the authorization is terminated or revoked sooner.  Performed at [facility], [address] [place], [place] [address]      Labs: BNP (last 3 results) Recent Labs    09/06/19 1840 10/02/19 2057 10/10/19 0337  BNP 171.6* 105.3* 379.0*   Basic Metabolic Panel: Recent Labs  Lab 10/10/19 0336  NA 141  K 4.4  CL 103  CO2 31  GLUCOSE 73  BUN 27*  CREATININE 1.21*  CALCIUM 8.3*   Liver Function Tests: Recent Labs  Lab 10/10/19 0336  AST 14*  ALT 13  ALKPHOS 95  BILITOT 0.5  PROT 6.3*  ALBUMIN 3.2*   No results for input(s): LIPASE, AMYLASE in the last 168 hours. No results for input(s): AMMONIA in the last 168 hours. CBC: Recent Labs  Lab 10/10/19 0336  WBC 12.7*  NEUTROABS 7.2  HGB 11.2*  HCT 39.7  MCV 73.5*  PLT 336   Cardiac Enzymes: No results for input(s): CKTOTAL, CKMB, CKMBINDEX, TROPONINI in the last 168 hours. BNP: Invalid input(s): POCBNP CBG: Recent Labs  Lab 10/03/19 1724 10/03/19 2111 10/04/19 0842 10/10/19 0822  GLUCAP 232* 165* 108* 119*   D-Dimer No results for input(s): DDIMER in the last 72 hours. Hgb A1c No results for  input(s): HGBA1C in the last 72 hours. Lipid Profile No results for input(s): CHOL, HDL, LDLCALC, TRIG, CHOLHDL, LDLDIRECT in the last 72 hours. Thyroid function studies No results for input(s): TSH, T4TOTAL, T3FREE, THYROIDAB in the last 72 hours.  Invalid input(s): FREET3 Anemia work up No results for input(s): VITAMINB12, FOLATE, FERRITIN, TIBC, IRON, RETICCTPCT in the last 72 hours. Urinalysis    Component Value Date/Time   COLORURINE STRAW (A) 07/30/2019 2204   APPEARANCEUR CLEAR (A) 07/30/2019 2204   LABSPEC 1.009 07/30/2019 2204   PHURINE 5.0 07/30/2019 2204   GLUCOSEU NEGATIVE 07/30/2019 2204   HGBUR NEGATIVE 07/30/2019 2204   [place] 07/30/2019 2204   KETONESUR NEGATIVE 07/30/2019 2204   PROTEINUR 100 (A) 07/30/2019 2204   NITRITE NEGATIVE 07/30/2019 2204   LEUKOCYTESUR NEGATIVE 07/30/2019 2204   Sepsis Labs Invalid input(s): PROCALCITONIN,  WBC,  LACTICIDVEN Microbiology Recent Results (from the past 240 hour(s))  SARS Coronavirus 2 by RT PCR (hospital order, performed in [place] hospital lab) Nasopharyngeal Nasopharyngeal Swab     Status: None   Collection Time: 10/03/19  4:38 AM   Specimen: Nasopharyngeal Swab  Result Value Ref Range Status   SARS Coronavirus 2 NEGATIVE NEGATIVE Final    Comment: (NOTE) SARS-CoV-2 target nucleic acids are NOT DETECTED.  The SARS-CoV-2 RNA is generally detectable in upper and lower respiratory specimens during the acute phase of infection. The lowest concentration of SARS-CoV-2 viral copies this assay can detect is 250 copies / mL. A negative result does not preclude SARS-CoV-2 infection and should not be used as the sole basis for treatment or other patient management decisions.  A negative result may occur with improper specimen collection / handling, submission of specimen other than nasopharyngeal swab, presence of viral mutation(s) within the areas targeted by this assay, and inadequate number of viral  copies (<250 copies / mL). A negative result must be combined with clinical observations, patient history, and epidemiological information.  Fact Sheet for Patients:   StrictlyIdeas.no  Fact Sheet for Healthcare Providers: BankingDealers.co.za  This test is not yet approved or  cleared by the [place] FDA and has been authorized for detection and/or diagnosis of SARS-CoV-2 by FDA under an Emergency Use Authorization (EUA).  This EUA will remain in effect (meaning this test can be used) for the duration of the COVID-19 declaration under Section 564(b)(1) of the Act, 21 U.S.C. section 360bbb-3(b)(1), unless the authorization is terminated or revoked sooner.  Performed at [facility], [address] [place], [place] [address]   SARS Coronavirus 2 by RT PCR (hospital order, performed in [facility] hospital lab) Nasopharyngeal Nasopharyngeal Swab     Status: None   Collection Time: 10/10/19  5:27 AM   Specimen: Nasopharyngeal Swab  Result Value Ref Range Status   SARS Coronavirus 2 NEGATIVE NEGATIVE Final    Comment: (NOTE) SARS-CoV-2 target nucleic acids are NOT DETECTED.  The SARS-CoV-2 RNA is generally detectable in upper and lower respiratory specimens during the acute phase of infection. The lowest concentration of SARS-CoV-2 viral copies this assay can detect is 250 copies / mL. A negative result does not preclude SARS-CoV-2 infection and should not be used as the sole basis for treatment or other patient management decisions.  A negative result may occur with improper specimen collection / handling, submission of specimen other than nasopharyngeal swab, presence of viral mutation(s) within the areas targeted by this assay, and inadequate number of viral copies (<250 copies / mL). A negative result must be combined with clinical observations, patient history, and epidemiological information.  Fact Sheet for  Patients:   StrictlyIdeas.no  Fact Sheet for Healthcare Providers: BankingDealers.co.za  This test is not yet approved or  cleared by the [place] FDA and has been authorized for detection and/or diagnosis of SARS-CoV-2 by FDA under an Emergency Use Authorization (EUA).  This EUA will remain in effect (meaning this test can be used) for the duration of the COVID-19 declaration under Section 564(b)(1) of the Act, 21 U.S.C. section 360bbb-3(b)(1), unless the authorization is terminated or revoked sooner.  Performed at [facility], [address] [place], [place] [address]     Time coordinating discharge:  25 minutes.  SIGNED:  Ivor Costa, MD [facility] 10/10/2019, 2:54 PM   If 7PM-7AM, please contact night-coverage [email or web address]

## 2019-10-10 NOTE — ED Notes (Signed)
[facility]  called for  transport

## 2019-10-10 NOTE — H&P (Addendum)
History and Physical    Mario Voong CVE:[id number] DOB: 1941-03-04 DOA: 10/10/2019  Referring MD/NP/PA:   PCP: Alfredia Client, MD   Patient coming from:  The patient is coming from [place].  At baseline, pt is independent for most of ADL.        Chief Complaint: SOB  HPI: Karla Sanchez is a 79 y.o. female with medical history significant of hypertension, diabetes mellitus, COPD, gout, colon cancer (s/p of her right and left colectomy 2020), dCHF, CKD stage IIIa, anemia, who presents with shortness of breath.  Patient states for shortness breath started yesterday, which has been progressively worsening.  She has chronic cough with little mucus production.  Patient denies chest pain, fever or chills. She states that she used to use oxygen at home, but recently she did not need oxygen at home. She endorses compliance with her Lasix and her inhalers. Patient has asymmetric swelling of her right lower extremity which is chronicsince April.  She has had 3 venous Doppler ultrasounds which were all negative for DVT.  Patient has diarrhea in the past 2 days.  She has had 3-4 times of watery diarrhea each day.  Denies nausea, vomiting or abdominal pain.  No symptoms of UTI or unilateral weakness. Pt was found to have oxygen desaturation to 88% on room air, which improved her to 94% on 3 L nasal cannula oxygen.  ED Course: pt was found to have WBC 12.7, troponin 13, BNP 133, negative COVID-19 PCR, stable renal function, temperature normal, blood pressure 146/65, heart rate 58, RR 19, chest x-ray negative.  Patient is admitted to [facility] bed as inpatient by accepting MD.  Review of Systems:   General: no fevers, chills, no body weight gain,  has fatigue HEENT: no blurry vision, hearing changes or sore throat Respiratory: has dyspnea, coughing, wheezing CV: no chest pain, no palpitations GI: no nausea, vomiting, abdominal pain, has diarrhea, no constipation GU: no dysuria, burning on  urination, increased urinary frequency, hematuria  Ext: has leg edema Neuro: no unilateral weakness, numbness, or tingling, no vision change or hearing loss Skin: no rash, no skin tear. MSK: No muscle spasm, no deformity, no limitation of range of movement in spin Heme: No easy bruising.  Travel history: No recent long distant travel.  Allergy:  Allergies  Allergen Reactions  . Codeine Nausea Only, Other (See Comments) and Swelling    Other Reaction: nausea and vomiting Other reaction(s): NAUSEA   . Other Other (See Comments)    Other reaction(s): SWELLING  Other reaction(s): MUSCLE PAIN Muscle aches Other reaction(s): MUSCLE PAIN Muscle aches Other reaction(s): MUSCLE PAIN Muscle aches   . Exenatide Nausea And Vomiting  . Levofloxacin Other (See Comments)    Other Reaction: sloughing of buccal mucosa Other reaction(s): OTHER Lost skin in mouth Patient states she can take Cipro without difficulty and has taken it many times in the past Other Reaction: sloughing of buccal mucosa   . Losartan Other (See Comments)    Weight gain Weight gain   . Tetracyclines & Related Other (See Comments)    Past Medical History:  Diagnosis Date  . CHF (congestive heart failure) ([place])   . Colon cancer [facility])    s/p right and left colectomy 2020  . COPD (chronic obstructive pulmonary disease) ([place])   . Diabetes mellitus without complication ([place])   . Diastolic heart failure ([place])   . Gout   . Hypertension     Past Surgical History:  Procedure Laterality Date  .  COLON SURGERY    . TUBAL LIGATION  1970    Social History:  reports that she has never smoked. She has never used smokeless tobacco. She reports previous alcohol use. She reports that she does not use drugs.  Family History:  Family History  Problem Relation Age of Onset  . Lung cancer Father      Prior to Admission medications   Medication Sig Start Date End Date Taking? Authorizing Provider  acetaminophen  (TYLENOL) 325 MG tablet Take 650 mg by mouth every 6 (six) hours as needed.   Yes [provider]  albuterol (VENTOLIN HFA) 108 (90 Base) MCG/ACT inhaler Inhale 2 puffs into the lungs every 6 (six) hours as needed for wheezing or shortness of breath. 09/29/19  Yes Darylene Price A, FNP  colchicine 0.6 MG tablet Take 0.6 mg by mouth daily. 04/13/19  Yes [provider]  furosemide (LASIX) 40 MG tablet Take 1 tablet (40 mg total) by mouth daily. Patient taking differently: Take 40 mg by mouth 2 (two) times daily.  07/13/19 10/10/19 Yes Lorella Nimrod, MD  insulin glargine (LANTUS) 100 UNIT/ML injection Inject 0.15 mLs (15 Units total) into the skin at bedtime. 07/13/19  Yes Lorella Nimrod, MD  loratadine (CLARITIN) 10 MG tablet Take 1 tablet (10 mg total) by mouth daily. 08/03/19 10/10/19 Yes Arta Silence, MD  metoprolol tartrate (LOPRESSOR) 50 MG tablet Take 1 tablet (50 mg total) by mouth 2 (two) times daily. 09/29/19  Yes Hackney, Tina A, FNP  SYRINGE-NEEDLE, DISP, 3 ML (LUER LOCK SAFETY SYRINGES) 25G X 5/8" 3 ML MISC 15 Units by Does not apply route at bedtime. 07/13/19   Lorella Nimrod, MD    Physical Exam: Vitals:   10/09/19 2348 10/10/19 0500 10/10/19 0551 10/10/19 0600  BP:  132/76  (!) 146/65  Pulse:    93  Resp:   18 19  Temp:   98 F (36.7 C) 97.6 F (36.4 C)  TempSrc:   Oral Oral  SpO2: 95% 97%  94%  Weight:      Height:       General: Not in acute distress HEENT:       Eyes: PERRL, EOMI, no scleral icterus.       ENT: No discharge from the ears and nose, no pharynx injection, no tonsillar enlargement.        Neck: No JVD, no bruit, no mass felt. Heme: No neck lymph node enlargement. Cardiac: O2/U2, RRR, 1/6 systolic murmurs, No gallops or rubs. Respiratory: Decreased air movement bilaterally. Has mild wheezing bilaterally GI: Soft, nondistended, nontender, no rebound pain, no organomegaly, BS present. GU: No hematuria Ext:  1+DP/PT pulse bilaterally. 2+ pitting  leg edema in right leg and trace leg edema in left leg. Musculoskeletal: No joint deformities, No joint redness or warmth, no limitation of ROM in spin. Skin: No rashes.  Neuro: Alert, oriented X3, cranial nerves II-XII grossly intact, moves all extremities normally.  Psych: Patient is not psychotic, no suicidal or hemocidal ideation.  Labs on Admission: I have personally reviewed following labs and imaging studies  CBC: Recent Labs  Lab 10/10/19 0336  WBC 12.7*  NEUTROABS 7.2  HGB 11.2*  HCT 39.7  MCV 73.5*  PLT 353   Basic Metabolic Panel: Recent Labs  Lab 10/10/19 0336  NA 141  K 4.4  CL 103  CO2 31  GLUCOSE 73  BUN 27*  CREATININE 1.21*  CALCIUM 8.3*   GFR: Estimated Creatinine Clearance: 44.9 mL/min (A) (by  C-G formula based on SCr of 1.21 mg/dL (H)). Liver Function Tests: Recent Labs  Lab 10/10/19 0336  AST 14*  ALT 13  ALKPHOS 95  BILITOT 0.5  PROT 6.3*  ALBUMIN 3.2*   No results for input(s): LIPASE, AMYLASE in the last 168 hours. No results for input(s): AMMONIA in the last 168 hours. Coagulation Profile: No results for input(s): INR, PROTIME in the last 168 hours. Cardiac Enzymes: No results for input(s): CKTOTAL, CKMB, CKMBINDEX, TROPONINI in the last 168 hours. BNP (last 3 results) No results for input(s): PROBNP in the last 8760 hours. HbA1C: No results for input(s): HGBA1C in the last 72 hours. CBG: Recent Labs  Lab 10/03/19 0821 10/03/19 1157 10/03/19 1724 10/03/19 2111 10/04/19 0842  GLUCAP 206* 331* 232* 165* 108*   Lipid Profile: No results for input(s): CHOL, HDL, LDLCALC, TRIG, CHOLHDL, LDLDIRECT in the last 72 hours. Thyroid Function Tests: No results for input(s): TSH, T4TOTAL, FREET4, T3FREE, THYROIDAB in the last 72 hours. Anemia Panel: No results for input(s): VITAMINB12, FOLATE, FERRITIN, TIBC, IRON, RETICCTPCT in the last 72 hours. Urine analysis:    Component Value Date/Time   COLORURINE STRAW (A) 07/30/2019 2204    APPEARANCEUR CLEAR (A) 07/30/2019 2204   LABSPEC 1.009 07/30/2019 2204   PHURINE 5.0 07/30/2019 [place] 07/30/2019 2204   HGBUR NEGATIVE 07/30/2019 2204   [place] 07/30/2019 2204   KETONESUR NEGATIVE 07/30/2019 2204   PROTEINUR 100 (A) 07/30/2019 2204   NITRITE NEGATIVE 07/30/2019 2204   LEUKOCYTESUR NEGATIVE 07/30/2019 2204   Sepsis Labs: @LABRCNTIP (procalcitonin:4,lacticidven:4) ) Recent Results (from the past 240 hour(s))  SARS Coronavirus 2 by RT PCR (hospital order, performed in [facility] hospital lab) Nasopharyngeal Nasopharyngeal Swab     Status: None   Collection Time: 10/03/19  4:38 AM   Specimen: Nasopharyngeal Swab  Result Value Ref Range Status   SARS Coronavirus 2 NEGATIVE NEGATIVE Final    Comment: (NOTE) SARS-CoV-2 target nucleic acids are NOT DETECTED.  The SARS-CoV-2 RNA is generally detectable in upper and lower respiratory specimens during the acute phase of infection. The lowest concentration of SARS-CoV-2 viral copies this assay can detect is 250 copies / mL. A negative result does not preclude SARS-CoV-2 infection and should not be used as the sole basis for treatment or other patient management decisions.  A negative result may occur with improper specimen collection / handling, submission of specimen other than nasopharyngeal swab, presence of viral mutation(s) within the areas targeted by this assay, and inadequate number of viral copies (<250 copies / mL). A negative result must be combined with clinical observations, patient history, and epidemiological information.  Fact Sheet for Patients:   StrictlyIdeas.no  Fact Sheet for Healthcare Providers: BankingDealers.co.za  This test is not yet approved or  cleared by the [place] FDA and has been authorized for detection and/or diagnosis of SARS-CoV-2 by FDA under an Emergency Use Authorization (EUA).  This EUA will  remain in effect (meaning this test can be used) for the duration of the COVID-19 declaration under Section 564(b)(1) of the Act, 21 U.S.C. section 360bbb-3(b)(1), unless the authorization is terminated or revoked sooner.  Performed at [facility], [address] [place], [place] [address]   SARS Coronavirus 2 by RT PCR (hospital order, performed in [facility] hospital lab) Nasopharyngeal Nasopharyngeal Swab     Status: None   Collection Time: 10/10/19  5:27 AM   Specimen: Nasopharyngeal Swab  Result Value Ref Range Status  SARS Coronavirus 2 NEGATIVE NEGATIVE Final    Comment: (NOTE) SARS-CoV-2 target nucleic acids are NOT DETECTED.  The SARS-CoV-2 RNA is generally detectable in upper and lower respiratory specimens during the acute phase of infection. The lowest concentration of SARS-CoV-2 viral copies this assay can detect is 250 copies / mL. A negative result does not preclude SARS-CoV-2 infection and should not be used as the sole basis for treatment or other patient management decisions.  A negative result may occur with improper specimen collection / handling, submission of specimen other than nasopharyngeal swab, presence of viral mutation(s) within the areas targeted by this assay, and inadequate number of viral copies (<250 copies / mL). A negative result must be combined with clinical observations, patient history, and epidemiological information.  Fact Sheet for Patients:   StrictlyIdeas.no  Fact Sheet for Healthcare Providers: BankingDealers.co.za  This test is not yet approved or  cleared by the [place] FDA and has been authorized for detection and/or diagnosis of SARS-CoV-2 by FDA under an Emergency Use Authorization (EUA).  This EUA will remain in effect (meaning this test can be used) for the duration of the COVID-19 declaration under Section 564(b)(1) of the Act, 21 U.S.C. section  360bbb-3(b)(1), unless the authorization is terminated or revoked sooner.  Performed at [facility], [address] [place], [place] [address]      Radiological Exams on Admission: DG Chest 1 View  Result Date: 10/10/2019 CLINICAL DATA:  Shortness of breath.  History of COPD. EXAM: CHEST  1 VIEW COMPARISON:  10/02/2019 FINDINGS: The cardiac silhouette remains mildly enlarged. There is chronic enlargement of the central pulmonary arteries which may reflect underlying pulmonary arterial hypertension. Chronic accentuation of the interstitial markings is unchanged without overt pulmonary edema. No confluent airspace opacity, sizeable pleural effusion, pneumothorax is identified. No acute osseous abnormality is seen. IMPRESSION: Chronic findings without evidence of an acute cardiopulmonary process. Electronically Signed   By: Logan Bores M.D.   On: 10/10/2019 04:26     EKG: Independently reviewed.  Sinus rhythm, QTC 458, LAD, T wave inversion in inferior leads and precordial leads which existed in previous EKG, bifascicular block   Assessment/Plan Principal Problem:   COPD exacerbation (HCC) Active Problems:   Chronic diastolic CHF (congestive heart failure) (HCC)   Type 2 diabetes mellitus with stage 3 chronic kidney disease (HCC)   Gout   Anemia of chronic disease   Hypertension   CKD (chronic kidney disease), stage IIIa   Acute on chronic respiratory failure with hypoxia (HCC)   Diarrhea   Acute on chronic respiratory failure with hypoxia due to COPD exacerbation [facility]): Patient has cough, decreased air movement and mild wheezing on auscultation, clinically consistent service COPD exacerbation.  -Will admit to Med-surg bed as inpatient -Bronchodilators -prednisone 60 mg was given in ED -->will continue bid -Azithromycin 500 mg IV was given in ED, will continue 250 mg daily orally -Mucinex for cough  -Incentive spirometry -Follow up blood culture x2, sputum  culture -Nasal cannula oxygen as needed to maintain O2 saturation 93% or greater -PT/OT -Consult transition care team  Chronic diastolic CHF (congestive heart failure) ([place]): 2D echo on 07/11/2019 showed EF of 55 to 60%.  Patient has trace leg edema in the left leg and chronic asymmetrical right leg edema.  BNP 133.  No pulmonary edema chest x-ray.  Clinically CHF seem to be compensated.  Patient received 60 mg of Lasix in the ED. -Will continue home dose Lasix  Type 2 diabetes  mellitus with stage 3 chronic kidney disease ([place]): Most recent A1c 8.0 , poorly controled. Patient is taking Lantus at home -will decrease Lantus dose from 60 to 30 unit daily -SSI  Gout -Continue colchicine  Anemia of chronic disease: Normocytic anemia.  Hemoglobin stable 11.2 -Follow-up with CBC  Hypertension -IV hydralazine as needed -Continue metoprolol -Patient is also on Lasix  CKD (chronic kidney disease), stage IIIa: Stable -Follow-up of BMP  Diarrhea: Etiology is not clear.  No nausea, vomiting, abdominal pain.  No fever or chills.  P -Follow-up C. difficile PCR and GI pathogen panel -Supportive care -As needed Zofran        DVT ppx: SQ Lovenox Code Status: Full code per pt Family Communication: not done, no family member is at bed side.    Disposition Plan:  Anticipate discharge back to previous environment Consults called:  none Admission status: Med-surg bed as inpt        Status is: Inpatient  Remains inpatient appropriate because:Inpatient level of care appropriate due to severity of illness.  Patient has multiple comorbidities, now presents with acute on chronic respiratory failure with hypoxia due to COPD exacerbation, has new oxygen requirement.  Her presentation is highly complicated.  Patient is at high risk of deteriorating.  Patient will need to be treated in hospital for at least 2 days.   Dispo: The patient is from: [facility]              Anticipated d/c is to: to be  determined              Anticipated d/c date is: 2 days              Patient currently is not medically stable to d/c.          Date of Service 10/10/2019    Ivor Costa [facility]   If 7PM-7AM, please contact night-coverage [email or web address] 10/10/2019, 8:03 AM

## 2019-10-10 NOTE — ED Notes (Signed)
Attempted to give pt breathing tx and lovenox, pt refuses both. Pt states, "I dont have blood clots and I have never had blood clots, the doctor knows that!". Teaching provided to patient regarding the reason for lovenox while in the hospital but pt still refuses. Breathing tx refused as well. Pt states, "I just had one, I am not taking one right now because I want to sleep".

## 2019-10-10 NOTE — ED Notes (Signed)
Patient noted to have runs of vtach on telemetry. On assessment patient denies chest pain, lightheaded dizziness, or nausea. She reports "always" feeling short of breath. MD made aware. EKG obtained. NSR noted.

## 2019-10-10 NOTE — ED Notes (Signed)
Pt states TOC RN told her she "had a choice to be admitted or to go home". Pt states she is no longer being admitted and states she needs an ambulance back to the hotel where she lives. Spoke with Dr. Blaine Hamper who states pt is still up for admission. Pt reports she is not being admitted and is not going to the floor because "it will not do any good". PT reports she is going to go back to the hotel and then go to [place] because it is close to home. Dr. Blaine Hamper informed.

## 2019-10-10 NOTE — ED Provider Notes (Signed)
[facility] Emergency Department Provider Note  ____________________________________________  Time seen: Approximately 3:32 AM  I have reviewed the triage vital signs and the nursing notes.   HISTORY  Chief Complaint Shortness of Breath   HPI Karla Sanchez is a 79 y.o. female the history of CHF, COPD, diabetes, hypertension, anemia, obesity who presents for evaluation of shortness of breath.  Patient reports that her symptoms started this evening.  She has a cough however that is chronic and unchanged.  She reports that she was trying to fall asleep tonight and could not because of the shortness of breath.  She props herself up in 5 pillows but that is chronic for her.  She endorses compliance with her Lasix and her inhalers.  She tried to use it this evening with no significant relief.  No fever, no chest pain, no known exposures to Covid, no vaccines.  She does not use oxygen at home.  Patient has asymmetric swelling of her right lower extremity which is chronic  since April.  She has had 3 venous Doppler ultrasounds which were all negative for DVT.  She is also complaining of fluid retention in her abdomen which is usually where she gets it.  After being placed on oxygen patient reports improvement of her shortness of breath.  Past Medical History:  Diagnosis Date   CHF (congestive heart failure) (HCC)    Colon cancer (HCC)    s/p right and left colectomy 2020   COPD (chronic obstructive pulmonary disease) (HCC)    Diabetes mellitus without complication (HCC)    Diastolic heart failure ([place])    Gout    Hypertension     Patient Active Problem List   Diagnosis Date Noted   COPD with acute exacerbation ([place]) 10/03/2019   Obesity, Class III, BMI 40-49.9 (morbid obesity) ([place]) 10/03/2019   Acute exacerbation of CHF (congestive heart failure) ([place]) 09/08/2019   Acute CHF (congestive heart failure) ([place]) 09/07/2019   COPD exacerbation (HCC)  07/09/2019   Chronic diastolic CHF (congestive heart failure) ([place]) 07/09/2019   Type 2 diabetes mellitus with stage 3 chronic kidney disease ([place]) 07/09/2019   Peripheral edema 07/09/2019   Gout 07/09/2019   Obesity, diabetes, and hypertension syndrome ([place]) 07/09/2019   Anemia of chronic disease 07/09/2019    Past Surgical History:  Procedure Laterality Date   COLON SURGERY     TUBAL LIGATION  1970    Prior to Admission medications   Medication Sig Start Date End Date Taking? Authorizing Provider  acetaminophen (TYLENOL) 325 MG tablet Take 650 mg by mouth every 6 (six) hours as needed.    [provider]  albuterol (VENTOLIN HFA) 108 (90 Base) MCG/ACT inhaler Inhale 2 puffs into the lungs every 6 (six) hours as needed for wheezing or shortness of breath. 09/29/19   [name], FNP  colchicine 0.6 MG tablet Take 0.6 mg by mouth daily. 04/13/19   [provider]  furosemide (LASIX) 40 MG tablet Take 1 tablet (40 mg total) by mouth daily. Patient taking differently: Take 40 mg by mouth 2 (two) times daily.  07/13/19 09/28/19  Lorella Nimrod, MD  insulin glargine (LANTUS) 100 UNIT/ML injection Inject 0.15 mLs (15 Units total) into the skin at bedtime. 07/13/19   Lorella Nimrod, MD  loratadine (CLARITIN) 10 MG tablet Take 1 tablet (10 mg total) by mouth daily. 08/03/19 10/02/19  Arta Silence, MD  metoprolol tartrate (LOPRESSOR) 50 MG tablet Take 1 tablet (50 mg total) by mouth  2 (two) times daily. 09/29/19   Hackney, Otila Kluver A, FNP  SYRINGE-NEEDLE, DISP, 3 ML (LUER LOCK SAFETY SYRINGES) 25G X 5/8" 3 ML MISC 15 Units by Does not apply route at bedtime. 07/13/19   Lorella Nimrod, MD    Allergies Codeine, Other, Exenatide, Levofloxacin, Losartan, and Tetracyclines & related  No family history on file.  Social History Social History   Tobacco Use   Smoking status: Never Smoker   Smokeless tobacco: Never Used  Substance Use Topics   Alcohol use: Not Currently    Drug use: Never    Review of Systems  Constitutional: Negative for fever. Eyes: Negative for visual changes. ENT: Negative for sore throat. Neck: No neck pain  Cardiovascular: Negative for chest pain. Respiratory: + shortness of breath. Gastrointestinal: Negative for abdominal pain, vomiting or diarrhea. Genitourinary: Negative for dysuria. Musculoskeletal: Negative for back pain. Skin: Negative for rash. Neurological: Negative for headaches, weakness or numbness. Psych: No SI or HI  ____________________________________________   PHYSICAL EXAM:  VITAL SIGNS: ED Triage Vitals  Enc Vitals Group     BP 10/09/19 2334 (!) 129/45     Pulse Rate 10/09/19 2334 (!) 58     Resp 10/09/19 2334 17     Temp 10/09/19 2334 98.1 F (36.7 C)     Temp Source 10/09/19 2334 Oral     SpO2 10/09/19 2334 92 %     Weight 10/09/19 2336 220 lb (99.8 kg)     Height 10/09/19 2336 5\' 6"  (1.676 m)     Head Circumference --      Peak Flow --      Pain Score 10/09/19 2345 0     Pain Loc --      Pain Edu? --      Excl. in [place]? --     Constitutional: Alert and oriented, chronically ill-appearing in no distress.  HEENT:      Head: Normocephalic and atraumatic.         Eyes: Conjunctivae are normal. Sclera is non-icteric.       Mouth/Throat: Mucous membranes are moist.       Neck: Supple with no signs of meningismus. Cardiovascular: Regular rate and rhythm. No murmurs, gallops, or rubs. 2+ symmetrical distal pulses are present in all extremities. No JVD. Respiratory: Hypoxic to mid-upper 80s on RA. Severely diminished breath sounds bilaterally with faint crackles gastrointestinal: Soft, non tender. Musculoskeletal: Asymmetric swelling with right greater than left, 3+ pitting edema on the right and 2+ on the left Neurologic: Normal speech and language. Face is symmetric. Moving all extremities. No gross focal neurologic deficits are appreciated. Skin: Skin is warm, dry and intact. No rash  noted. Psychiatric: Mood and affect are normal. Speech and behavior are normal.  ____________________________________________   LABS (all labs ordered are listed, but only abnormal results are displayed)  Labs Reviewed  CBC WITH DIFFERENTIAL/PLATELET - Abnormal; Notable for the following components:      Result Value   WBC 12.7 (*)    RBC 5.40 (*)    Hemoglobin 11.2 (*)    MCV 73.5 (*)    MCH 20.7 (*)    MCHC 28.2 (*)    RDW 21.4 (*)    Eosinophils Absolute 1.0 (*)    All other components within normal limits  COMPREHENSIVE METABOLIC PANEL - Abnormal; Notable for the following components:   BUN 27 (*)    Creatinine, Ser 1.21 (*)    Calcium 8.3 (*)    Total Protein 6.3 (*)  Albumin 3.2 (*)    AST 14 (*)    GFR calc non Af Amer 43 (*)    GFR calc Af Amer 49 (*)    All other components within normal limits  SARS CORONAVIRUS 2 BY RT PCR (HOSPITAL ORDER, [facility])  BRAIN NATRIURETIC PEPTIDE  TROPONIN I (HIGH SENSITIVITY)   ____________________________________________  EKG  ED ECG REPORT I, Rudene Re, the attending physician, personally viewed and interpreted this ECG.  Sinus bradycardia, rate of 54, right bundle branch block, LAFB, T wave inversions in inferior lateral leads with no ST elevation.  No significant changes when compared to prior. ____________________________________________  RADIOLOGY  I have personally reviewed the images performed during this visit and I agree with the Radiologist's read.   Interpretation by Radiologist:  DG Chest 1 View  Result Date: 10/10/2019 CLINICAL DATA:  Shortness of breath.  History of COPD. EXAM: CHEST  1 VIEW COMPARISON:  10/02/2019 FINDINGS: The cardiac silhouette remains mildly enlarged. There is chronic enlargement of the central pulmonary arteries which may reflect underlying pulmonary arterial hypertension. Chronic accentuation of the interstitial markings is unchanged without overt  pulmonary edema. No confluent airspace opacity, sizeable pleural effusion, pneumothorax is identified. No acute osseous abnormality is seen. IMPRESSION: Chronic findings without evidence of an acute cardiopulmonary process. Electronically Signed   By: Logan Bores M.D.   On: 10/10/2019 04:26      ____________________________________________   PROCEDURES  Procedure(s) performed:yes .1-3 Lead EKG Interpretation Performed by: Rudene Re, MD Authorized by: Rudene Re, MD     Interpretation: non-specific     ECG rate assessment: bradycardic     Rhythm: sinus bradycardia     Critical Care performed: yes  CRITICAL CARE Performed by: Rudene Re  ?  Total critical care time: 45 min  Critical care time was exclusive of separately billable procedures and treating other patients.  Critical care was necessary to treat or prevent imminent or life-threatening deterioration.  Critical care was time spent personally by me on the following activities: development of treatment plan with patient and/or surrogate as well as nursing, discussions with consultants, evaluation of patient's response to treatment, examination of patient, obtaining history from patient or surrogate, ordering and performing treatments and interventions, ordering and review of laboratory studies, ordering and review of radiographic studies, pulse oximetry and re-evaluation of patient's condition.  ____________________________________________   INITIAL IMPRESSION / ASSESSMENT AND PLAN / ED COURSE   79 y.o. female the history of CHF, COPD, diabetes, hypertension, anemia, obesity who presents for evaluation of shortness of breath.  Patient looks hypervolemic with crackles and pitting edema bilaterally.  She does have asymmetric pitting edema but that has been ongoing since April.  She has had 3 Doppler ultrasounds which were all negative for DVT.  She has no chest pain, no fever.  She is in no  respiratory distress, but she is hypoxic on RA.  She has diminished air movement bilaterally with faint crackles.  Differential diagnoses including CHF exacerbation versus COPD exacerbation versus bronchitis versus pneumonia.  Low suspicion for PE with no tachypnea, tachycardia, hypoxia, no prior history of such.  Unfortunately an evaluation for PE is impossible since patient will not allow an IV to be placed.  She is known to our department and is here frequently for similar complaints.  She has never allowed an IV to be placed even though she understands the risks of not being tested for PE.  Will give an extra dose of  Lasix, DuoNeb's and steroids.  Will check chest x-ray and basic labs.  Patient placed on telemetry for close monitoring.  EKG with no signs of acute ischemia or dysrhythmias.  Old medical records reviewed.     _________________________ 4:52 AM on 10/10/2019 -----------------------------------------  Chest x-ray no acute findings, confirmed by radiology.  Patient with mild leukocytosis with white count of 12.7.  No significant electrolyte derangements.  Patient received 3 duo nebs and steroids, and Lasix.  She was monitored for a couple of hours with good urine output but remains hypoxic with sats between 82 and 86% on room air which improved on 3 L nasal cannula. Therefore will admit to hospitalist for further care.  Diagnosis most likely COPD exacerbation/bronchitis.  Since patient has an elevated white count we will start her on antibiotics.  _____________________________________________ Please note:  Patient was evaluated in Emergency Department today for the symptoms described in the history of present illness. Patient was evaluated in the context of the global COVID-19 pandemic, which necessitated consideration that the patient might be at risk for infection with the SARS-CoV-2 virus that causes COVID-19. Institutional protocols and algorithms that pertain to the evaluation of  patients at risk for COVID-19 are in a state of rapid change based on information released by regulatory bodies including the CDC and federal and state organizations. These policies and algorithms were followed during the patient's care in the ED.  Some ED evaluations and interventions may be delayed as a result of limited staffing during the pandemic.   [place] Controlled Substance Database was reviewed by me. ____________________________________________   FINAL CLINICAL IMPRESSION(S) / ED DIAGNOSES   Final diagnoses:  Acute respiratory failure with hypoxia (HCC)  COPD exacerbation ([place])      NEW MEDICATIONS STARTED DURING THIS VISIT:  ED Discharge Orders    None       Note:  This document was prepared using Dragon voice recognition software and may include unintentional dictation errors.    Alfred Levins, [place], MD 10/10/19 [phone]

## 2019-10-10 NOTE — ED Notes (Signed)
Pt assisted onto stretcher at request; reports [facility] tonight upon lying down; denies any other accomp symptoms at this time; Dr Alfred Levins at bedside to assess pt

## 2019-10-10 NOTE — Progress Notes (Signed)
Patient assisted with a complete linen change, along with perineal care. Head of bed elevated for comfort. Water given. Call bell within reach. Instructed to call for assistance when needed.

## 2019-10-10 NOTE — TOC Initial Note (Signed)
Transition of Care [facility]) - Initial/Assessment Note    Patient Details  Name: Karla Sanchez MRN: [id number] Date of Birth: 22-Oct-1940  [facility] of [facility]) CM/SW Contact:    Anselm Pancoast, RN Phone Number: 10/10/2019, 11:35 AM  Clinical Narrative:                 Spoke to patient at bedside who states she is wanting to be admitted to [facility] but has no finances to afford it. Patient states her sister had "welfare" and was able to stay there and she wants that service. RN CM explained that patient did not qualify for medicaid due to monthly income over $2000. Patient states that is fine and she would be okay with discharge back to her home at [facility] with [facility]. RN CM outreached to [facility] [place] to check availability. Patient is adamant she is not being admitted to hospital and wants to go home. RN CM advised patient to speak with EDP regarding her wishes to discharge back home.   Expected Discharge Plan: [place] Barriers to Discharge: Continued Medical Work up   Patient Goals and CMS Choice Patient states their goals for this hospitalization and ongoing recovery are:: want to go to [facility] [employer] [facility]      Expected Discharge Plan and Services Expected Discharge Plan: [place] In-house Referral: Clinical Social Work Discharge Planning Services: Mobile Meals Post Acute Care Choice: Home Health Living arrangements for the past 2 months: Hotel/Motel                                      Prior Living Arrangements/Services Living arrangements for the past 2 months: Hotel/Motel Lives with:: Self Patient language and need for interpreter reviewed:: Yes Do you feel safe going back to the place where you live?: Yes      Need for Family Participation in Patient Care: Yes (Comment) Care giver support system in place?: Yes (comment) Current home services: DME (walker, electric scooter) Criminal Activity/Legal Involvement  Pertinent to Current Situation/Hospitalization: No - Comment as needed  Activities of Daily Living      Permission Sought/Granted Permission sought to share information with : Family Supports Permission granted to share information with : Yes, Verbal Permission Granted  Share Information with NAME: mabel, Friend  Permission granted to share info w AGENCY: HHC Agency        Emotional Assessment Appearance:: Appears stated age Attitude/Demeanor/Rapport: Angry, Complaining, Engaged Affect (typically observed): Agitated, Appropriate Orientation: : Oriented to Self, Oriented to Place, Oriented to  Time, Oriented to Situation Alcohol / Substance Use: Not Applicable Psych Involvement: No (comment)  Admission diagnosis:  COPD exacerbation ([place]) [J44.1] Patient Active Problem List   Diagnosis Date Noted  . Hypertension   . CKD (chronic kidney disease), stage IIIa   . Acute on chronic respiratory failure with hypoxia ([place])   . Diarrhea   . COPD with acute exacerbation ([place]) 10/03/2019  . Obesity, Class III, BMI 40-49.9 (morbid obesity) ([place]) 10/03/2019  . Acute exacerbation of CHF (congestive heart failure) ([place]) 09/08/2019  . Acute CHF (congestive heart failure) ([place]) 09/07/2019  . COPD exacerbation ([place]) 07/09/2019  . Chronic diastolic CHF (congestive heart failure) ([place]) 07/09/2019  . Type 2 diabetes mellitus with stage 3 chronic kidney disease ([place]) 07/09/2019  . Peripheral edema 07/09/2019  . Gout 07/09/2019  . Obesity, diabetes, and  hypertension syndrome ([place]) 07/09/2019  . Anemia of chronic disease 07/09/2019   PCP:  Alfredia Client, MD Pharmacy:   [facility] [place], [address] [place] [address] Phone: [phone] Fax: [phone]  [place], [place] - [address] [place] [address] Phone: [phone] Fax:  [phone]     Social Determinants of Health (SDOH) Interventions    Readmission Risk Interventions No flowsheet data found.

## 2019-10-21 ENCOUNTER — Encounter: Payer: Self-pay | Admitting: *Deleted

## 2019-10-21 ENCOUNTER — Observation Stay (HOSPITAL_BASED_OUTPATIENT_CLINIC_OR_DEPARTMENT_OTHER)
Admission: EM | Admit: 2019-10-21 | Discharge: 2019-10-22 | Disposition: A | Discharge status: Home / Self Care | Payer: Medicare Other | Source: Home / Self Care | Attending: Emergency Medicine | Admitting: Emergency Medicine

## 2019-10-21 ENCOUNTER — Emergency Department: Payer: Medicare Other

## 2019-10-21 ENCOUNTER — Other Ambulatory Visit: Payer: Self-pay

## 2019-10-21 DIAGNOSIS — I509 Heart failure, unspecified: Secondary | ICD-10-CM

## 2019-10-21 DIAGNOSIS — J449 Chronic obstructive pulmonary disease, unspecified: Secondary | ICD-10-CM | POA: Diagnosis present

## 2019-10-21 DIAGNOSIS — Z79899 Other long term (current) drug therapy: Secondary | ICD-10-CM | POA: Insufficient documentation

## 2019-10-21 DIAGNOSIS — N1831 Chronic kidney disease, stage 3a: Secondary | ICD-10-CM | POA: Insufficient documentation

## 2019-10-21 DIAGNOSIS — J441 Chronic obstructive pulmonary disease with (acute) exacerbation: Secondary | ICD-10-CM | POA: Insufficient documentation

## 2019-10-21 DIAGNOSIS — E1122 Type 2 diabetes mellitus with diabetic chronic kidney disease: Secondary | ICD-10-CM | POA: Insufficient documentation

## 2019-10-21 DIAGNOSIS — L03115 Cellulitis of right lower limb: Secondary | ICD-10-CM | POA: Diagnosis present

## 2019-10-21 DIAGNOSIS — E119 Type 2 diabetes mellitus without complications: Secondary | ICD-10-CM | POA: Diagnosis present

## 2019-10-21 DIAGNOSIS — Z85038 Personal history of other malignant neoplasm of large intestine: Secondary | ICD-10-CM | POA: Insufficient documentation

## 2019-10-21 DIAGNOSIS — R0602 Shortness of breath: Secondary | ICD-10-CM | POA: Insufficient documentation

## 2019-10-21 DIAGNOSIS — Z91199 Patient's noncompliance with other medical treatment and regimen due to unspecified reason: Secondary | ICD-10-CM

## 2019-10-21 DIAGNOSIS — Z794 Long term (current) use of insulin: Secondary | ICD-10-CM | POA: Insufficient documentation

## 2019-10-21 DIAGNOSIS — L0390 Cellulitis, unspecified: Secondary | ICD-10-CM

## 2019-10-21 DIAGNOSIS — N1832 Chronic kidney disease, stage 3b: Secondary | ICD-10-CM | POA: Diagnosis present

## 2019-10-21 DIAGNOSIS — Z20822 Contact with and (suspected) exposure to covid-19: Secondary | ICD-10-CM | POA: Insufficient documentation

## 2019-10-21 DIAGNOSIS — I5033 Acute on chronic diastolic (congestive) heart failure: Secondary | ICD-10-CM | POA: Diagnosis present

## 2019-10-21 DIAGNOSIS — I130 Hypertensive heart and chronic kidney disease with heart failure and stage 1 through stage 4 chronic kidney disease, or unspecified chronic kidney disease: Secondary | ICD-10-CM | POA: Insufficient documentation

## 2019-10-21 DIAGNOSIS — I5023 Acute on chronic systolic (congestive) heart failure: Secondary | ICD-10-CM | POA: Insufficient documentation

## 2019-10-21 LAB — CBC WITH DIFFERENTIAL/PLATELET
Abs Immature Granulocytes: 0.03 10*3/uL (ref 0.00–0.07)
Basophils Absolute: 0.1 10*3/uL (ref 0.0–0.1)
Basophils Relative: 1 %
Eosinophils Absolute: 0.8 10*3/uL — ABNORMAL HIGH (ref 0.0–0.5)
Eosinophils Relative: 8 %
HCT: 37.2 % (ref 36.0–46.0)
Hemoglobin: 10.6 g/dL — ABNORMAL LOW (ref 12.0–15.0)
Immature Granulocytes: 0 %
Lymphocytes Relative: 26 %
Lymphs Abs: 2.7 10*3/uL (ref 0.7–4.0)
MCH: 20.9 pg — ABNORMAL LOW (ref 26.0–34.0)
MCHC: 28.5 g/dL — ABNORMAL LOW (ref 30.0–36.0)
MCV: 73.4 fL — ABNORMAL LOW (ref 80.0–100.0)
Monocytes Absolute: 0.8 10*3/uL (ref 0.1–1.0)
Monocytes Relative: 8 %
Neutro Abs: 6 10*3/uL (ref 1.7–7.7)
Neutrophils Relative %: 57 %
Platelets: 353 10*3/uL (ref 150–400)
RBC: 5.07 MIL/uL (ref 3.87–5.11)
RDW: 19.7 % — ABNORMAL HIGH (ref 11.5–15.5)
WBC: 10.4 10*3/uL (ref 4.0–10.5)
nRBC: 0 % (ref 0.0–0.2)

## 2019-10-21 LAB — TROPONIN I (HIGH SENSITIVITY): Troponin I (High Sensitivity): 13 ng/L (ref <18)

## 2019-10-21 LAB — COMPREHENSIVE METABOLIC PANEL
ALT: 12 U/L (ref 0–44)
AST: 12 U/L — ABNORMAL LOW (ref 15–41)
Albumin: 3.1 g/dL — ABNORMAL LOW (ref 3.5–5.0)
Alkaline Phosphatase: 102 U/L (ref 38–126)
Anion gap: 12 (ref 5–15)
BUN: 34 mg/dL — ABNORMAL HIGH (ref 8–23)
CO2: 27 mmol/L (ref 22–32)
Calcium: 8.4 mg/dL — ABNORMAL LOW (ref 8.9–10.3)
Chloride: 100 mmol/L (ref 98–111)
Creatinine, Ser: 1.14 mg/dL — ABNORMAL HIGH (ref 0.44–1.00)
GFR calc Af Amer: 53 mL/min — ABNORMAL LOW (ref >60)
GFR calc non Af Amer: 46 mL/min — ABNORMAL LOW (ref >60)
Glucose, Bld: 135 mg/dL — ABNORMAL HIGH (ref 70–99)
Potassium: 4.6 mmol/L (ref 3.5–5.1)
Sodium: 139 mmol/L (ref 135–145)
Total Bilirubin: 0.5 mg/dL (ref 0.3–1.2)
Total Protein: 6.4 g/dL — ABNORMAL LOW (ref 6.5–8.1)

## 2019-10-21 LAB — BRAIN NATRIURETIC PEPTIDE: B Natriuretic Peptide: 113 pg/mL — ABNORMAL HIGH (ref 0.0–100.0)

## 2019-10-21 MED ORDER — FUROSEMIDE 40 MG PO TABS
80.0000 mg | ORAL_TABLET | Freq: Once | ORAL | Status: AC
  Administered 2019-10-22: 80 mg via ORAL
  Filled 2019-10-21: qty 2

## 2019-10-21 MED ORDER — METOPROLOL TARTRATE 50 MG PO TABS
50.0000 mg | ORAL_TABLET | Freq: Once | ORAL | Status: AC
  Administered 2019-10-22: 50 mg via ORAL
  Filled 2019-10-21: qty 1

## 2019-10-21 MED ORDER — IPRATROPIUM-ALBUTEROL 0.5-2.5 (3) MG/3ML IN SOLN
3.0000 mL | Freq: Once | RESPIRATORY_TRACT | Status: AC
  Administered 2019-10-21: 3 mL via RESPIRATORY_TRACT
  Filled 2019-10-21: qty 3

## 2019-10-21 MED ORDER — CEPHALEXIN 500 MG PO CAPS
500.0000 mg | ORAL_CAPSULE | Freq: Once | ORAL | Status: AC
  Administered 2019-10-22: 500 mg via ORAL
  Filled 2019-10-21: qty 1

## 2019-10-21 NOTE — ED Triage Notes (Signed)
Per EMT report, patient c/o increased shortness of breath through out the day. Patient lives in the [facility] after leaving skilled nursing facility. Patient's BLE are taut and edematous, right lower extremity is reddened. Patient arrived 84% on room air. Patient placed on 4L O2 via [place] and pulse ox is 94%. Patient states she takes fluid pills. Patient refuses an IV, but will allow a blood draw.

## 2019-10-21 NOTE — ED Notes (Signed)
Patient refused chest x-ray. Patient used personal inhaler after coughing. Patient was agitated with staff. Pulse ox remains at 98% on 3L O2. Oxygen reduced to 2L.

## 2019-10-21 NOTE — ED Notes (Signed)
Patient is resting in a darkened room with task light on.

## 2019-10-21 NOTE — ED Provider Notes (Signed)
[facility] Emergency Department Provider Note  ____________________________________________   I have reviewed the triage vital signs and the nursing notes.   HISTORY  Chief Complaint Shortness of Breath   History limited by: Not Limited   HPI Karla Sanchez is a 79 y.o. female who presents to the emergency department today because of concerns for shortness of breath. Patient states it has been getting better over the past few days. She does have a history of heart failure and states she has been taking her Lasix. She has had continued swelling in her lower extremities and shortness of breath. Patient states she is not typically on oxygen all the has been put on it once in the past a long time ago. The patient has also noticed some redness and swelling to her right lower extremity. She does have history of diabetes although states she has never had a foot ulcer before.   Records reviewed. Per medical record review patient has a history of CHF.  Past Medical History:  Diagnosis Date  . CHF (congestive heart failure) ([place])   . Colon cancer [facility])    s/p right and left colectomy 2020  . COPD (chronic obstructive pulmonary disease) ([place])   . Diabetes mellitus without complication ([facility])   . Diastolic heart failure ([place])   . Gout   . Hypertension     Patient Active Problem List   Diagnosis Date Noted  . Hypertension   . CKD (chronic kidney disease), stage IIIa   . Acute on chronic respiratory failure with hypoxia ([place])   . Diarrhea   . COPD with acute exacerbation ([place]) 10/03/2019  . Obesity, Class III, BMI 40-49.9 (morbid obesity) ([place]) 10/03/2019  . Acute exacerbation of CHF (congestive heart failure) ([place]) 09/08/2019  . Acute CHF (congestive heart failure) ([place]) 09/07/2019  . COPD exacerbation ([place]) 07/09/2019  . Chronic diastolic CHF (congestive heart failure) ([place]) 07/09/2019  . Type 2 diabetes mellitus with stage 3 chronic kidney disease ([place])  07/09/2019  . Peripheral edema 07/09/2019  . Gout 07/09/2019  . Obesity, diabetes, and hypertension syndrome ([place]) 07/09/2019  . Anemia of chronic disease 07/09/2019    Past Surgical History:  Procedure Laterality Date  . COLON SURGERY    . TUBAL LIGATION  1970    Prior to Admission medications   Medication Sig Start Date End Date Taking? Authorizing Provider  albuterol (VENTOLIN HFA) 108 (90 Base) MCG/ACT inhaler Inhale 2 puffs into the lungs every 6 (six) hours as needed for wheezing or shortness of breath. 09/29/19  Yes Darylene Price A, FNP  colchicine 0.6 MG tablet Take 0.6 mg by mouth daily. 04/13/19  Yes [provider]  furosemide (LASIX) 40 MG tablet Take 1 tablet (40 mg total) by mouth daily. Patient taking differently: Take 40 mg by mouth 2 (two) times daily.  07/13/19 10/21/19 Yes Lorella Nimrod, MD  insulin glargine (LANTUS) 100 UNIT/ML injection Inject 0.15 mLs (15 Units total) into the skin at bedtime. 07/13/19  Yes Lorella Nimrod, MD  loratadine (CLARITIN) 10 MG tablet Take 1 tablet (10 mg total) by mouth daily. 08/03/19 10/21/19 Yes Arta Silence, MD  metoprolol tartrate (LOPRESSOR) 50 MG tablet Take 1 tablet (50 mg total) by mouth 2 (two) times daily. 09/29/19  Yes Hackney, Tina A, FNP  SYRINGE-NEEDLE, DISP, 3 ML (LUER LOCK SAFETY SYRINGES) 25G X 5/8" 3 ML MISC 15 Units by Does not apply route at bedtime. 07/13/19  Yes Lorella Nimrod, MD  acetaminophen (TYLENOL) 325 MG tablet Take  650 mg by mouth every 6 (six) hours as needed.    [provider]    Allergies Codeine, Other, Exenatide, Levofloxacin, Losartan, and Tetracyclines & related  Family History  Problem Relation Age of Onset  . Lung cancer Father     Social History Social History   Tobacco Use  . Smoking status: Never Smoker  . Smokeless tobacco: Never Used  Substance Use Topics  . Alcohol use: Not Currently  . Drug use: Never    Review of Systems Constitutional: No fever/chills Eyes:  No visual changes. ENT: No sore throat. Cardiovascular: Denies chest pain. Respiratory: Positive for shortness of breath. Gastrointestinal: No abdominal pain.  No nausea, no vomiting.  No diarrhea.   Genitourinary: Negative for dysuria. Musculoskeletal: Positive for lower extremity edema. Skin: Positive for erythema to right leg Neurological: Negative for headaches, focal weakness or numbness.  ____________________________________________   PHYSICAL EXAM:  VITAL SIGNS: ED Triage Vitals  Enc Vitals Group     BP 10/21/19 2119 (!) 187/73     Pulse Rate 10/21/19 2119 70     Resp 10/21/19 2119 (!) 27     Temp 10/21/19 2119 98.2 F (36.8 C)     Temp Source 10/21/19 2119 Oral     SpO2 10/21/19 2119 98 %     Weight 10/21/19 2122 (!) 220 lb (99.8 kg)     Height 10/21/19 2122 5\' 6"  (1.676 m)     Head Circumference --      Peak Flow --      Pain Score 10/21/19 2122 0   Constitutional: Alert and oriented.  Eyes: Conjunctivae are normal.  ENT      Head: Normocephalic and atraumatic.      Nose: No congestion/rhinnorhea.      Mouth/Throat: Mucous membranes are moist.      Neck: No stridor. Hematological/Lymphatic/Immunilogical: No cervical lymphadenopathy. Cardiovascular: Normal rate, regular rhythm.  No murmurs, rubs, or gallops. Respiratory: Increased respiratory effort. Diffuse wheezing and poor air movement. Gastrointestinal: Soft and non tender. No rebound. No guarding.  Genitourinary: Deferred Musculoskeletal: Positive for bilateral lower extremity edema. Erythema to right lower leg. Neurologic:  Normal speech and language. No gross focal neurologic deficits are appreciated.  Skin:  Skin is warm, dry and intact. No rash noted. Psychiatric: Mood and affect are normal. Speech and behavior are normal. Patient exhibits appropriate insight and judgment.  ____________________________________________    LABS (pertinent positives/negatives)  CMP na 139, k 4.6, glu 135, cr 1.14 CBC  wbc 10.4, hgb 10.6, plt 353 BNP 113.0 ____________________________________________   EKG  I, Nance Pear, attending physician, personally viewed and interpreted this EKG  EKG Time: 2132 Rate: 132 Rhythm: atrial fibrillation Axis: left axis deviation Intervals: qtc 245 QRS: RBBB ST changes: no st elevation Impression: abnormal ekg ____________________________________________    RADIOLOGY  CXR Concern for pulmonary edema  ____________________________________________   PROCEDURES  Procedures  ____________________________________________   INITIAL IMPRESSION / ASSESSMENT AND PLAN / ED COURSE  Pertinent labs & imaging results that were available during my care of the patient were reviewed by me and considered in my medical decision making (see chart for details).   Patient presented to the emergency department today because of concerns for shortness of breath. Patient does have a history of CHF. Chest x-ray is concerning for pulmonary edema and BNP is slightly elevated. On exam patient has diffuse wheezing and poor air movement. At this time I do think fluid overload is likely causing patient's symptoms. She is refusing  IV but is willing to take oral medication so oral Lasix was ordered. Additionally patient is complaining of some right foot erythema and it does appear infected. Will start antibiotics.  ____________________________________________   FINAL CLINICAL IMPRESSION(S) / ED DIAGNOSES  Final diagnoses:  SOB (shortness of breath)  Acute on chronic congestive heart failure, unspecified heart failure type ([place])  Cellulitis, unspecified cellulitis site     Note: This dictation was prepared with Dragon dictation. Any transcriptional errors that result from this process are unintentional     Nance Pear, MD 10/21/19 2322

## 2019-10-21 NOTE — ED Notes (Signed)
Calls for "help" answered from pt, pt reports "I'm hot" - temperature adjusted, pt wants door open despite refusal to wear mask for infection control and adjacent pt being positive for COVID

## 2019-10-21 NOTE — ED Notes (Signed)
Patient wanted the thermostat lowered to 55 and then asked for a warm blanket.

## 2019-10-21 NOTE — ED Notes (Signed)
Report given to Shannon RN

## 2019-10-21 NOTE — ED Notes (Signed)
Lab called for troponin add on 

## 2019-10-21 NOTE — ED Notes (Signed)
Patient became very agitated with this writer, yelling and screaming, swearing. P Thermostat was lowered per patient's request. Patient wants the door ajar. Patient was informed about Covid policy and that we currently have a Covid positive patient next door. Agricultural consultant at bedside with security.

## 2019-10-22 ENCOUNTER — Emergency Department: Payer: Medicare Other

## 2019-10-22 ENCOUNTER — Inpatient Hospital Stay
Admission: EM | Admit: 2019-10-22 | Discharge: 2019-10-30 | DRG: 291 | Disposition: A | Discharge status: Home / Self Care | Payer: Medicare Other | Attending: Internal Medicine | Admitting: Internal Medicine

## 2019-10-22 ENCOUNTER — Other Ambulatory Visit: Payer: Self-pay

## 2019-10-22 ENCOUNTER — Encounter: Payer: Self-pay | Admitting: Family Medicine

## 2019-10-22 DIAGNOSIS — I130 Hypertensive heart and chronic kidney disease with heart failure and stage 1 through stage 4 chronic kidney disease, or unspecified chronic kidney disease: Principal | ICD-10-CM | POA: Diagnosis present

## 2019-10-22 DIAGNOSIS — Z79899 Other long term (current) drug therapy: Secondary | ICD-10-CM

## 2019-10-22 DIAGNOSIS — E1159 Type 2 diabetes mellitus with other circulatory complications: Secondary | ICD-10-CM | POA: Diagnosis present

## 2019-10-22 DIAGNOSIS — Z794 Long term (current) use of insulin: Secondary | ICD-10-CM

## 2019-10-22 DIAGNOSIS — E1122 Type 2 diabetes mellitus with diabetic chronic kidney disease: Secondary | ICD-10-CM | POA: Diagnosis present

## 2019-10-22 DIAGNOSIS — Z9049 Acquired absence of other specified parts of digestive tract: Secondary | ICD-10-CM

## 2019-10-22 DIAGNOSIS — R55 Syncope and collapse: Secondary | ICD-10-CM

## 2019-10-22 DIAGNOSIS — I5033 Acute on chronic diastolic (congestive) heart failure: Secondary | ICD-10-CM

## 2019-10-22 DIAGNOSIS — J449 Chronic obstructive pulmonary disease, unspecified: Secondary | ICD-10-CM | POA: Diagnosis not present

## 2019-10-22 DIAGNOSIS — R0602 Shortness of breath: Secondary | ICD-10-CM

## 2019-10-22 DIAGNOSIS — N1832 Chronic kidney disease, stage 3b: Secondary | ICD-10-CM | POA: Diagnosis not present

## 2019-10-22 DIAGNOSIS — Z9119 Patient's noncompliance with other medical treatment and regimen: Secondary | ICD-10-CM

## 2019-10-22 DIAGNOSIS — J9611 Chronic respiratory failure with hypoxia: Secondary | ICD-10-CM

## 2019-10-22 DIAGNOSIS — E66813 Obesity, class 3: Secondary | ICD-10-CM | POA: Diagnosis present

## 2019-10-22 DIAGNOSIS — I272 Pulmonary hypertension, unspecified: Secondary | ICD-10-CM | POA: Diagnosis present

## 2019-10-22 DIAGNOSIS — Z91199 Patient's noncompliance with other medical treatment and regimen due to unspecified reason: Secondary | ICD-10-CM

## 2019-10-22 DIAGNOSIS — Z801 Family history of malignant neoplasm of trachea, bronchus and lung: Secondary | ICD-10-CM

## 2019-10-22 DIAGNOSIS — Z993 Dependence on wheelchair: Secondary | ICD-10-CM

## 2019-10-22 DIAGNOSIS — E119 Type 2 diabetes mellitus without complications: Secondary | ICD-10-CM | POA: Diagnosis present

## 2019-10-22 DIAGNOSIS — E1165 Type 2 diabetes mellitus with hyperglycemia: Secondary | ICD-10-CM | POA: Diagnosis present

## 2019-10-22 DIAGNOSIS — I10 Essential (primary) hypertension: Secondary | ICD-10-CM | POA: Diagnosis present

## 2019-10-22 DIAGNOSIS — Z85038 Personal history of other malignant neoplasm of large intestine: Secondary | ICD-10-CM

## 2019-10-22 DIAGNOSIS — Z6841 Body Mass Index (BMI) 40.0 and over, adult: Secondary | ICD-10-CM

## 2019-10-22 DIAGNOSIS — M109 Gout, unspecified: Secondary | ICD-10-CM | POA: Diagnosis present

## 2019-10-22 DIAGNOSIS — Z9114 Patient's other noncompliance with medication regimen: Secondary | ICD-10-CM

## 2019-10-22 DIAGNOSIS — I509 Heart failure, unspecified: Secondary | ICD-10-CM

## 2019-10-22 DIAGNOSIS — J810 Acute pulmonary edema: Secondary | ICD-10-CM

## 2019-10-22 DIAGNOSIS — E669 Obesity, unspecified: Secondary | ICD-10-CM | POA: Diagnosis present

## 2019-10-22 DIAGNOSIS — L03115 Cellulitis of right lower limb: Secondary | ICD-10-CM

## 2019-10-22 DIAGNOSIS — Z20822 Contact with and (suspected) exposure to covid-19: Secondary | ICD-10-CM | POA: Diagnosis present

## 2019-10-22 LAB — CBC
HCT: 33.6 % — ABNORMAL LOW (ref 36.0–46.0)
Hemoglobin: 10.1 g/dL — ABNORMAL LOW (ref 12.0–15.0)
MCH: 21.6 pg — ABNORMAL LOW (ref 26.0–34.0)
MCHC: 30.1 g/dL (ref 30.0–36.0)
MCV: 71.9 fL — ABNORMAL LOW (ref 80.0–100.0)
Platelets: 320 10*3/uL (ref 150–400)
RBC: 4.67 MIL/uL (ref 3.87–5.11)
RDW: 19.9 % — ABNORMAL HIGH (ref 11.5–15.5)
WBC: 9.6 10*3/uL (ref 4.0–10.5)
nRBC: 0 % (ref 0.0–0.2)

## 2019-10-22 LAB — COMPREHENSIVE METABOLIC PANEL
ALT: 11 U/L (ref 0–44)
AST: 13 U/L — ABNORMAL LOW (ref 15–41)
Albumin: 2.8 g/dL — ABNORMAL LOW (ref 3.5–5.0)
Alkaline Phosphatase: 92 U/L (ref 38–126)
Anion gap: 10 (ref 5–15)
BUN: 38 mg/dL — ABNORMAL HIGH (ref 8–23)
CO2: 30 mmol/L (ref 22–32)
Calcium: 8.1 mg/dL — ABNORMAL LOW (ref 8.9–10.3)
Chloride: 99 mmol/L (ref 98–111)
Creatinine, Ser: 1.21 mg/dL — ABNORMAL HIGH (ref 0.44–1.00)
GFR calc Af Amer: 49 mL/min — ABNORMAL LOW (ref >60)
GFR calc non Af Amer: 43 mL/min — ABNORMAL LOW (ref >60)
Glucose, Bld: 181 mg/dL — ABNORMAL HIGH (ref 70–99)
Potassium: 4.7 mmol/L (ref 3.5–5.1)
Sodium: 139 mmol/L (ref 135–145)
Total Bilirubin: 0.5 mg/dL (ref 0.3–1.2)
Total Protein: 5.8 g/dL — ABNORMAL LOW (ref 6.5–8.1)

## 2019-10-22 LAB — TROPONIN I (HIGH SENSITIVITY): Troponin I (High Sensitivity): 12 ng/L (ref <18)

## 2019-10-22 LAB — BRAIN NATRIURETIC PEPTIDE: B Natriuretic Peptide: 93.1 pg/mL (ref 0.0–100.0)

## 2019-10-22 LAB — SARS CORONAVIRUS 2 BY RT PCR (HOSPITAL ORDER, PERFORMED IN ~~LOC~~ HOSPITAL LAB): SARS Coronavirus 2: NEGATIVE

## 2019-10-22 MED ORDER — INSULIN GLARGINE 100 UNIT/ML ~~LOC~~ SOLN
15.0000 [IU] | Freq: Every day | SUBCUTANEOUS | Status: DC
  Administered 2019-10-23: 15 [IU] via SUBCUTANEOUS
  Filled 2019-10-22 (×2): qty 0.15

## 2019-10-22 MED ORDER — MENTHOL 3 MG MT LOZG
1.0000 | LOZENGE | OROMUCOSAL | Status: DC | PRN
  Filled 2019-10-22: qty 9

## 2019-10-22 MED ORDER — METOPROLOL TARTRATE 50 MG PO TABS
50.0000 mg | ORAL_TABLET | Freq: Two times a day (BID) | ORAL | Status: DC
  Administered 2019-10-23 – 2019-10-27 (×5): 50 mg via ORAL
  Filled 2019-10-22 (×8): qty 1

## 2019-10-22 MED ORDER — LORATADINE 10 MG PO TABS
10.0000 mg | ORAL_TABLET | Freq: Every day | ORAL | Status: DC
  Administered 2019-10-23 – 2019-10-30 (×8): 10 mg via ORAL
  Filled 2019-10-22 (×8): qty 1

## 2019-10-22 MED ORDER — ENOXAPARIN SODIUM 40 MG/0.4ML ~~LOC~~ SOLN
40.0000 mg | SUBCUTANEOUS | Status: DC
  Administered 2019-10-22: 40 mg via SUBCUTANEOUS
  Filled 2019-10-22 (×3): qty 0.4

## 2019-10-22 MED ORDER — ONDANSETRON HCL 4 MG/2ML IJ SOLN: 4.0000 mg | Freq: Four times a day (QID) | INTRAMUSCULAR | Status: DC | PRN

## 2019-10-22 MED ORDER — SODIUM CHLORIDE 0.9% FLUSH: 3.0000 mL | INTRAVENOUS | Status: DC | PRN

## 2019-10-22 MED ORDER — INSULIN ASPART 100 UNIT/ML ~~LOC~~ SOLN
0.0000 [IU] | Freq: Every day | SUBCUTANEOUS | Status: DC
  Administered 2019-10-25: 5 [IU] via SUBCUTANEOUS
  Administered 2019-10-26 – 2019-10-29 (×3): 2 [IU] via SUBCUTANEOUS
  Administered 2019-10-30: 5 [IU] via SUBCUTANEOUS
  Filled 2019-10-22 (×4): qty 1

## 2019-10-22 MED ORDER — COLCHICINE 0.6 MG PO TABS
0.6000 mg | ORAL_TABLET | Freq: Every day | ORAL | Status: DC
  Administered 2019-10-23 – 2019-10-30 (×8): 0.6 mg via ORAL
  Filled 2019-10-22 (×9): qty 1

## 2019-10-22 MED ORDER — FUROSEMIDE 10 MG/ML IJ SOLN
60.0000 mg | Freq: Two times a day (BID) | INTRAMUSCULAR | Status: DC
  Administered 2019-10-23 – 2019-10-24 (×3): 60 mg via INTRAVENOUS
  Filled 2019-10-22 (×4): qty 8

## 2019-10-22 MED ORDER — INSULIN ASPART 100 UNIT/ML ~~LOC~~ SOLN
0.0000 [IU] | Freq: Three times a day (TID) | SUBCUTANEOUS | Status: DC
  Administered 2019-10-23: 7 [IU] via SUBCUTANEOUS
  Administered 2019-10-24: 3 [IU] via SUBCUTANEOUS
  Administered 2019-10-24: 4 [IU] via SUBCUTANEOUS
  Administered 2019-10-25: 7 [IU] via SUBCUTANEOUS
  Administered 2019-10-25 (×2): 3 [IU] via SUBCUTANEOUS
  Administered 2019-10-26 (×2): 4 [IU] via SUBCUTANEOUS
  Administered 2019-10-26: 3 [IU] via SUBCUTANEOUS
  Administered 2019-10-27: 4 [IU] via SUBCUTANEOUS
  Administered 2019-10-27: 7 [IU] via SUBCUTANEOUS
  Administered 2019-10-28: 11 [IU] via SUBCUTANEOUS
  Administered 2019-10-28: 4 [IU] via SUBCUTANEOUS
  Administered 2019-10-29: 15 [IU] via SUBCUTANEOUS
  Administered 2019-10-29: 3 [IU] via SUBCUTANEOUS
  Filled 2019-10-22 (×15): qty 1

## 2019-10-22 MED ORDER — CEPHALEXIN 500 MG PO CAPS
500.0000 mg | ORAL_CAPSULE | Freq: Three times a day (TID) | ORAL | 0 refills | Status: DC
Start: 2019-10-22 — End: 2019-10-30

## 2019-10-22 MED ORDER — ACETAMINOPHEN 325 MG PO TABS: 650.0000 mg | ORAL_TABLET | Freq: Four times a day (QID) | ORAL | Status: DC | PRN

## 2019-10-22 MED ORDER — CEPHALEXIN 500 MG PO CAPS: 500.0000 mg | ORAL_CAPSULE | Freq: Three times a day (TID) | ORAL | Status: DC

## 2019-10-22 MED ORDER — SODIUM CHLORIDE 0.9% FLUSH: 3.0000 mL | Freq: Two times a day (BID) | INTRAVENOUS | Status: DC

## 2019-10-22 MED ORDER — SODIUM CHLORIDE 0.9 % IV SOLN: 250.0000 mL | INTRAVENOUS | Status: DC | PRN

## 2019-10-22 MED ORDER — ALBUTEROL SULFATE (2.5 MG/3ML) 0.083% IN NEBU
2.5000 mg | INHALATION_SOLUTION | Freq: Four times a day (QID) | RESPIRATORY_TRACT | Status: DC | PRN
  Administered 2019-10-23 – 2019-10-29 (×7): 2.5 mg via RESPIRATORY_TRACT
  Filled 2019-10-22 (×7): qty 3

## 2019-10-22 MED ORDER — ACETAMINOPHEN 325 MG PO TABS
650.0000 mg | ORAL_TABLET | ORAL | Status: DC | PRN
  Administered 2019-10-28 – 2019-10-29 (×2): 650 mg via ORAL
  Filled 2019-10-22 (×2): qty 2

## 2019-10-22 MED ORDER — IPRATROPIUM-ALBUTEROL 0.5-2.5 (3) MG/3ML IN SOLN
3.0000 mL | Freq: Four times a day (QID) | RESPIRATORY_TRACT | Status: DC | PRN
  Administered 2019-10-22 (×2): 3 mL via RESPIRATORY_TRACT
  Filled 2019-10-22 (×2): qty 3

## 2019-10-22 MED ORDER — IPRATROPIUM-ALBUTEROL 0.5-2.5 (3) MG/3ML IN SOLN
RESPIRATORY_TRACT | Status: AC
  Administered 2019-10-22: 3 mL via RESPIRATORY_TRACT
  Filled 2019-10-22: qty 3

## 2019-10-22 NOTE — Social Work (Signed)
TOC CM/SW spoke to [facility] regarding patient's oxygen.  [employer] driver/attendant will deliver patient's oxygent tank 3L at about 1200 to [facility] ED - ED35A Patient's nurse updated.

## 2019-10-22 NOTE — ED Notes (Signed)
Assisted pt up to recliner. Stand by assist.

## 2019-10-22 NOTE — ED Triage Notes (Signed)
[facility] Per ems pt was seen here earlier today and up for admission, diagnosed with acute diastolic heart failure, pt left ama, pt arrives c/o sob and near syncope at home. Pt on 3l baseline,.

## 2019-10-22 NOTE — ED Notes (Signed)
Pt presents with labile emotions yelling and cursing at staff without any prior indication as to why. Pt refusing the placement of a external catheter due to for this RN and [facility]

## 2019-10-22 NOTE — H&P (Signed)
History and Physical   Karla Sanchez ZOX:[id number] DOB: 04-10-1940 DOA: 10/22/2019  Referring MD/NP/PA: Dr. Corky Downs  PCP: Alfredia Client, MD   Outpatient Specialists: [facility]  Patient coming from: Home at [facility]  Chief Complaint: Shortness of breath  HPI: Karla Sanchez is a 79 y.o. female with medical history significant of diastolic function CHF, colon cancer, COPD, morbid obesity, diabetes, who was seen in the ER last night with worsening shortness of breath fluid overload and diagnosed with acute exacerbation of her diastolic CHF.  She appears to be medically noncompliant staying in [place].  Patient was evaluated and was to be admitted but left AMA.  She was sitting in her wheelchair on the wheel chair apparently today when she had a syncopal episode.  She was scared so she came back to the ER.  She still has anasarca.  Denied any chest pain.  No worsening shortness of breath beyond yesterday.  Patient could not recall what happened when she passed out.  She has oxygen in the auto but not sure she had it on.  She is now being readmitted to the hospital with acute exacerbation of diastolic CHF.  She appears to be markedly fluid overloaded..  ED Course: Temperature is 98.2 blood pressure 144/48 pulse 98 respiratory rate of 27 oxygen sat 98% on room air.  Chemistry showed creatinine 1.21 BUN 49 calcium 8.1.  CBC showed hemoglobin 10.1.  Chest x-ray shows stable cardiomegaly and vascular congestion.  Patient being admitted with acute exacerbation of CHF.  Review of Systems: As per HPI otherwise 10 point review of systems negative.    Past Medical History:  Diagnosis Date  . CHF (congestive heart failure) ([place])   . Colon cancer [facility])    s/p right and left colectomy 2020  . COPD (chronic obstructive pulmonary disease) ([place])   . Diabetes mellitus without complication ([place])   . Diastolic heart failure ([place])   . Gout   . Hypertension      Past Surgical History:  Procedure Laterality Date  . COLON SURGERY    . TUBAL LIGATION  1970     reports that she has never smoked. She has never used smokeless tobacco. She reports previous alcohol use. She reports that she does not use drugs.  Allergies  Allergen Reactions  . Codeine Nausea Only, Other (See Comments) and Swelling    Other Reaction: nausea and vomiting Other reaction(s): NAUSEA   . Other Other (See Comments)    Other reaction(s): SWELLING  Other reaction(s): MUSCLE PAIN Muscle aches Other reaction(s): MUSCLE PAIN Muscle aches Other reaction(s): MUSCLE PAIN Muscle aches   . Exenatide Nausea And Vomiting  . Levofloxacin Other (See Comments)    Other Reaction: sloughing of buccal mucosa Other reaction(s): OTHER Lost skin in mouth Patient states she can take Cipro without difficulty and has taken it many times in the past Other Reaction: sloughing of buccal mucosa   . Losartan Other (See Comments)    Weight gain Weight gain   . Tetracyclines & Related Other (See Comments)    Family History  Problem Relation Age of Onset  . Lung cancer Father      Prior to Admission medications   Medication Sig Start Date End Date Taking? Authorizing Provider  acetaminophen (TYLENOL) 325 MG tablet Take 650 mg by mouth every 6 (six) hours as needed.   Yes [provider]  albuterol (VENTOLIN HFA) 108 (90 Base) MCG/ACT inhaler Inhale 2 puffs into the  lungs every 6 (six) hours as needed for wheezing or shortness of breath. 09/29/19  Yes Hackney, Tina A, FNP  cephALEXin (KEFLEX) 500 MG capsule Take 1 capsule (500 mg total) by mouth 3 (three) times daily for 10 days. 10/22/19 11/01/19 Yes Nena Polio, MD  colchicine 0.6 MG tablet Take 0.6 mg by mouth daily. 04/13/19  Yes [provider]  furosemide (LASIX) 40 MG tablet Take 1 tablet (40 mg total) by mouth daily. Patient taking differently: Take 40 mg by mouth 2 (two) times daily.  07/13/19 10/22/19 Yes  Lorella Nimrod, MD  insulin glargine (LANTUS) 100 UNIT/ML injection Inject 0.15 mLs (15 Units total) into the skin at bedtime. 07/13/19  Yes Lorella Nimrod, MD  loratadine (CLARITIN) 10 MG tablet Take 1 tablet (10 mg total) by mouth daily. 08/03/19 10/22/19 Yes Arta Silence, MD  metoprolol tartrate (LOPRESSOR) 50 MG tablet Take 1 tablet (50 mg total) by mouth 2 (two) times daily. 09/29/19  Yes Hackney, Tina A, FNP  SYRINGE-NEEDLE, DISP, 3 ML (LUER LOCK SAFETY SYRINGES) 25G X 5/8" 3 ML MISC 15 Units by Does not apply route at bedtime. 07/13/19  Ezekiel Slocumb, MD    Physical Exam: Vitals:   10/22/19 2145 10/22/19 2156  BP:  (!) 145/48  Pulse:  65  Resp:  22  Temp:  98.2 F (36.8 C)  TempSrc:  Oral  SpO2:  98%  Weight: (!) 99.8 kg   Height: 5\' 6"  (1.676 m)       Constitutional: Morbidly obese, very anxious, anasarca Vitals:   10/22/19 2145 10/22/19 2156  BP:  (!) 145/48  Pulse:  65  Resp:  22  Temp:  98.2 F (36.8 C)  TempSrc:  Oral  SpO2:  98%  Weight: (!) 99.8 kg   Height: 5\' 6"  (1.676 m)    Eyes: PERRL, lids and conjunctivae normal ENMT: Mucous membranes are moist. Posterior pharynx clear of any exudate or lesions.Normal dentition.  Neck: normal, supple, no masses, no thyromegaly Respiratory: Decreased air entry bilaterally with coarse breath sounds, significant bilateral crackles.  Increased respiratory effort. No accessory muscle use.  Cardiovascular: Regular rate and rhythm, no murmurs / rubs / gallops.  3+ extremity edema. 2+ pedal pulses. No carotid bruits.  Abdomen: Distended no tenderness, no masses palpated. No hepatosplenomegaly. Bowel sounds positive.  Musculoskeletal: no clubbing / cyanosis. No joint deformity upper and lower extremities. Good ROM, no contractures. Normal muscle tone.  Skin: no rashes, lesions, ulcers.  Chronic stasis dermatitis bilaterally lower extremity Neurologic: CN 2-12 grossly intact. Sensation intact, DTR normal. Strength 5/5 in all 4.   Psychiatric: Normal judgment and insight. Alert and oriented x 3.  Very anxious mood.     Labs on Admission: I have personally reviewed following labs and imaging studies  CBC: Recent Labs  Lab 10/21/19 2129  WBC 10.4  NEUTROABS 6.0  HGB 10.6*  HCT 37.2  MCV 73.4*  PLT 277   Basic Metabolic Panel: Recent Labs  Lab 10/21/19 2129  NA 139  K 4.6  CL 100  CO2 27  GLUCOSE 135*  BUN 34*  CREATININE 1.14*  CALCIUM 8.4*   GFR: Estimated Creatinine Clearance: 47.7 mL/min (A) (by C-G formula based on SCr of 1.14 mg/dL (H)). Liver Function Tests: Recent Labs  Lab 10/21/19 2129  AST 12*  ALT 12  ALKPHOS 102  BILITOT 0.5  PROT 6.4*  ALBUMIN 3.1*   No results for input(s): LIPASE, AMYLASE in the last 168 hours. No results for  input(s): AMMONIA in the last 168 hours. Coagulation Profile: No results for input(s): INR, PROTIME in the last 168 hours. Cardiac Enzymes: No results for input(s): CKTOTAL, CKMB, CKMBINDEX, TROPONINI in the last 168 hours. BNP (last 3 results) No results for input(s): PROBNP in the last 8760 hours. HbA1C: No results for input(s): HGBA1C in the last 72 hours. CBG: No results for input(s): GLUCAP in the last 168 hours. Lipid Profile: No results for input(s): CHOL, HDL, LDLCALC, TRIG, CHOLHDL, LDLDIRECT in the last 72 hours. Thyroid Function Tests: No results for input(s): TSH, T4TOTAL, FREET4, T3FREE, THYROIDAB in the last 72 hours. Anemia Panel: No results for input(s): VITAMINB12, FOLATE, FERRITIN, TIBC, IRON, RETICCTPCT in the last 72 hours. Urine analysis:    Component Value Date/Time   COLORURINE STRAW (A) 07/30/2019 2204   APPEARANCEUR CLEAR (A) 07/30/2019 2204   LABSPEC 1.009 07/30/2019 2204   PHURINE 5.0 07/30/2019 [place] 07/30/2019 2204   HGBUR NEGATIVE 07/30/2019 2204   [place] 07/30/2019 2204   KETONESUR NEGATIVE 07/30/2019 2204   PROTEINUR 100 (A) 07/30/2019 2204   NITRITE NEGATIVE 07/30/2019  2204   LEUKOCYTESUR NEGATIVE 07/30/2019 2204   Sepsis Labs: @LABRCNTIP (procalcitonin:4,lacticidven:4) ) Recent Results (from the past 240 hour(s))  SARS Coronavirus 2 by RT PCR (hospital order, performed in [place] hospital lab) Nasopharyngeal Nasopharyngeal Swab     Status: None   Collection Time: 10/21/19  9:29 PM   Specimen: Nasopharyngeal Swab  Result Value Ref Range Status   SARS Coronavirus 2 NEGATIVE NEGATIVE Final    Comment: (NOTE) SARS-CoV-2 target nucleic acids are NOT DETECTED.  The SARS-CoV-2 RNA is generally detectable in upper and lower respiratory specimens during the acute phase of infection. The lowest concentration of SARS-CoV-2 viral copies this assay can detect is 250 copies / mL. A negative result does not preclude SARS-CoV-2 infection and should not be used as the sole basis for treatment or other patient management decisions.  A negative result may occur with improper specimen collection / handling, submission of specimen other than nasopharyngeal swab, presence of viral mutation(s) within the areas targeted by this assay, and inadequate number of viral copies (<250 copies / mL). A negative result must be combined with clinical observations, patient history, and epidemiological information.  Fact Sheet for Patients:   StrictlyIdeas.no  Fact Sheet for Healthcare Providers: BankingDealers.co.za  This test is not yet approved or  cleared by the [place] FDA and has been authorized for detection and/or diagnosis of SARS-CoV-2 by FDA under an Emergency Use Authorization (EUA).  This EUA will remain in effect (meaning this test can be used) for the duration of the COVID-19 declaration under Section 564(b)(1) of the Act, 21 U.S.C. section 360bbb-3(b)(1), unless the authorization is terminated or revoked sooner.  Performed at [facility], [address], [place], [place] [address]       Radiological Exams on Admission: DG Chest Portable 1 View  Result Date: 10/21/2019 CLINICAL DATA:  Shortness of breath EXAM: PORTABLE CHEST 1 VIEW COMPARISON:  October 18, 2019 FINDINGS: The heart size and mediastinal contours are unchanged with mild cardiomegaly. There is prominence of the central pulmonary vasculature. Mildly increased interstitial markings are seen throughout both lungs. No pleural effusion is seen. No acute osseous abnormality. IMPRESSION: Diffuse mildly increased interstitial opacities which could be due to interstitial edema and/or infectious etiology. Electronically Signed   By: Prudencio Pair M.D.   On: 10/21/2019 22:32    EKG: Independently reviewed.  It shows  sinus rhythm with bifascicular block.  No significant ST changes  Assessment/Plan Principal Problem:   Acute exacerbation of CHF (congestive heart failure) (HCC) Active Problems:   Type 2 diabetes mellitus with stage 3 chronic kidney disease (HCC)   Obesity, diabetes, and hypertension syndrome (HCC)   COPD (chronic obstructive pulmonary disease) (HCC)   Obesity, Class III, BMI 40-49.9 (morbid obesity) ([place])   Hypertension   Chronic kidney disease, stage 3b   Non compliance with medical treatment     #1 acute exacerbation of diastolic CHF: Patient appears to have anasarca.  We will admit the patient.  IV diuretics.  Continue other cardiac medications.  Mobilize patient.  Dietary counseling will be given.  #2 type 2 diabetes: Sliding scale insulin with monitoring of blood sugar  #3 COPD: No acute exacerbation.  #4 chronic kidney disease stage IIIb: Appears to be at baseline  #5 morbid obesity: Dietary counseling.  #6 medication noncompliance: Counseling  #7 hypertension: Blood pressure elevated.  Resume home regimen and adjust  #8 syncope: Patient had syncopal episode which prompted her to come in. Monitor on telemetry. May consult cards in am   DVT prophylaxis: Lovenox Code Status: Full  code Family Communication: No family at bedside Disposition Plan: Home Consults called: None Admission status: Inpatient  Severity of Illness: The appropriate patient status for this patient is INPATIENT. Inpatient status is judged to be reasonable and necessary in order to provide the required intensity of service to ensure the patient's safety. The patient's presenting symptoms, physical exam findings, and initial radiographic and laboratory data in the context of their chronic comorbidities is felt to place them at high risk for further clinical deterioration. Furthermore, it is not anticipated that the patient will be medically stable for discharge from the hospital within 2 midnights of admission. The following factors support the patient status of inpatient.   " The patient's presenting symptoms include shortness of breath. " The worrisome physical exam findings include anasarca. " The initial radiographic and laboratory data are worrisome because of evidence of pulmonary edema. " The chronic co-morbidities include diabetes and CHF.   * I certify that at the point of admission it is my clinical judgment that the patient will require inpatient hospital care spanning beyond 2 midnights from the point of admission due to high intensity of service, high risk for further deterioration and high frequency of surveillance required.Barbette Merino MD [facility] Pager [phone]  If 7PM-7AM, please contact night-coverage [email or web address] Password [facility]  10/22/2019, 10:23 PM

## 2019-10-22 NOTE — ED Notes (Addendum)
[name] become bradycardic at this time with a HR of 44-50bpm while awake and fully alert. ED Charge made aware. Pt is not symptomatic at this time and is not complaining of anything to this RN

## 2019-10-22 NOTE — ED Provider Notes (Signed)
[facility] Emergency Department Provider Note   ____________________________________________    I have reviewed the triage vital signs and the nursing notes.   HISTORY  Chief Complaint Shortness of Breath and Near Syncope     HPI Karla Sanchez is a 79 y.o. female with history of CHF, COPD, diabetes who presents with complaints of shortness of breath and a near syncopal episode.  Patient apparently left after being admitted this morning for CHF exacerbation this morning because she did not want to stay in the hospital and was impatient with getting her room upstairs.  She denies fevers or chills.  No chest pain.  No nausea vomiting or diaphoresis.  Is on 3 L nasal cannula oxygen  Past Medical History:  Diagnosis Date   CHF (congestive heart failure) (HCC)    Colon cancer (HCC)    s/p right and left colectomy 2020   COPD (chronic obstructive pulmonary disease) (HCC)    Diabetes mellitus without complication (HCC)    Diastolic heart failure (HCC)    Gout    Hypertension     Patient Active Problem List   Diagnosis Date Noted   Acute on chronic diastolic CHF (congestive heart failure) ([place]) 10/22/2019   Cellulitis of right foot 10/22/2019   Non compliance with medical treatment    Chronic respiratory failure with hypoxia (HCC)    Hypertension    Chronic kidney disease, stage 3b    Acute on chronic respiratory failure with hypoxia (HCC)    Diarrhea    COPD (chronic obstructive pulmonary disease) ([place]) 10/03/2019   Obesity, Class III, BMI 40-49.9 (morbid obesity) ([place]) 10/03/2019   Acute exacerbation of CHF (congestive heart failure) ([place]) 09/08/2019   Acute CHF (congestive heart failure) ([place]) 09/07/2019   COPD exacerbation (HCC) 07/09/2019   Chronic diastolic CHF (congestive heart failure) ([place]) 07/09/2019   Type 2 diabetes mellitus with stage 3 chronic kidney disease ([place]) 07/09/2019   Peripheral edema 07/09/2019    Gout 07/09/2019   Obesity, diabetes, and hypertension syndrome ([place]) 07/09/2019   Anemia of chronic disease 07/09/2019    Past Surgical History:  Procedure Laterality Date   COLON SURGERY     TUBAL LIGATION  1970    Prior to Admission medications   Medication Sig Start Date End Date Taking? Authorizing Provider  acetaminophen (TYLENOL) 325 MG tablet Take 650 mg by mouth every 6 (six) hours as needed.   Yes [provider]  albuterol (VENTOLIN HFA) 108 (90 Base) MCG/ACT inhaler Inhale 2 puffs into the lungs every 6 (six) hours as needed for wheezing or shortness of breath. 09/29/19  Yes Hackney, Tina A, FNP  cephALEXin (KEFLEX) 500 MG capsule Take 1 capsule (500 mg total) by mouth 3 (three) times daily for 10 days. 10/22/19 11/01/19 Yes Nena Polio, MD  colchicine 0.6 MG tablet Take 0.6 mg by mouth daily. 04/13/19  Yes [provider]  furosemide (LASIX) 40 MG tablet Take 1 tablet (40 mg total) by mouth daily. Patient taking differently: Take 40 mg by mouth 2 (two) times daily.  07/13/19 10/22/19 Yes Lorella Nimrod, MD  insulin glargine (LANTUS) 100 UNIT/ML injection Inject 0.15 mLs (15 Units total) into the skin at bedtime. 07/13/19  Yes Lorella Nimrod, MD  loratadine (CLARITIN) 10 MG tablet Take 1 tablet (10 mg total) by mouth daily. 08/03/19 10/22/19 Yes Arta Silence, MD  metoprolol tartrate (LOPRESSOR) 50 MG tablet Take 1 tablet (50 mg total) by mouth 2 (two) times daily. 09/29/19  Yes Hackney, Tina A, FNP  SYRINGE-NEEDLE, DISP, 3 ML (LUER LOCK SAFETY SYRINGES) 25G X 5/8" 3 ML MISC 15 Units by Does not apply route at bedtime. 07/13/19  Yes Lorella Nimrod, MD     Allergies Codeine, Other, Exenatide, Levofloxacin, Losartan, and Tetracyclines & related  Family History  Problem Relation Age of Onset   Lung cancer Father     Social History Social History   Tobacco Use   Smoking status: Never Smoker   Smokeless tobacco: Never Used  Substance Use Topics   Alcohol  use: Not Currently   Drug use: Never    Review of Systems  Constitutional: No fever/chills Eyes: No visual changes.  ENT: No sore throat. Cardiovascular: Denies chest pain. Respiratory: As above Gastrointestinal: No abdominal pain.  No nausea, no vomiting.   Genitourinary: Negative for dysuria. Musculoskeletal: Negative for back pain. Skin: Negative for rash. Neurological: Negative for headache   ____________________________________________   PHYSICAL EXAM:  VITAL SIGNS: ED Triage Vitals  Enc Vitals Group     BP 10/22/19 2156 (!) 145/48     Pulse Rate 10/22/19 2156 65     Resp 10/22/19 2156 22     Temp 10/22/19 2156 98.2 F (36.8 C)     Temp Source 10/22/19 2156 Oral     SpO2 10/22/19 2156 98 %     Weight 10/22/19 2145 (!) 99.8 kg (220 lb)     Height 10/22/19 2145 1.676 m (5\' 6" )     Head Circumference --      Peak Flow --      Pain Score 10/22/19 2145 0     Pain Loc --      Pain Edu? --      Excl. in [place]? --     Constitutional: Alert and oriented.  interactive  Nose: No congestion/rhinnorhea. Mouth/Throat: Mucous membranes are moist.   Neck:  Painless ROM Cardiovascular: Normal rate, regular rhythm.   Good peripheral circulation. Respiratory: Normal respiratory effort.  No retractions.  Bibasilar Rales Gastrointestinal: Soft and nontender. No distention.  No CVA tenderness.  Musculoskeletal: Mild edema bilaterally, warm and well perfused, right lower leg likely mild cellulitis Neurologic:  Normal speech and language. No gross focal neurologic deficits are appreciated.  Skin:  Skin is warm, dry and intact.  Psychiatric: Mood and affect are normal. Speech and behavior are normal.  ____________________________________________   LABS (all labs ordered are listed, but only abnormal results are displayed)  Labs Reviewed  CBC  COMPREHENSIVE METABOLIC PANEL  BRAIN NATRIURETIC PEPTIDE  TROPONIN I (HIGH SENSITIVITY)    ____________________________________________  EKG  ED ECG REPORT I, Lavonia Drafts, the attending physician, personally viewed and interpreted this ECG.  Date: 10/22/2019  Rhythm: normal sinus rhythm QRS Axis: normal Intervals: Right bundle-branch block ST/T Wave abnormalities: Nonspecific changes Narrative Interpretation: no evidence of acute ischemia  ____________________________________________  RADIOLOGY  Chest x-ray reviewed by me, likely edema bilaterally ____________________________________________   PROCEDURES  Procedure(s) performed: No  Procedures   Critical Care performed: No ____________________________________________   INITIAL IMPRESSION / ASSESSMENT AND PLAN / ED COURSE  Pertinent labs & imaging results that were available during my care of the patient were reviewed by me and considered in my medical decision making (see chart for details).  Patient presents with shortness of breath, syncopal episode.  Has been admitted to the hospital earlier today and left [facility].  She reports she is willing to stay this time and will accept having an IV.  I  have contacted the hospitalist service to admit the patient    ____________________________________________   FINAL CLINICAL IMPRESSION(S) / ED DIAGNOSES  Final diagnoses:  Near syncope  Acute pulmonary edema ([place])  Shortness of breath        Note:  This document was prepared using Dragon voice recognition software and may include unintentional dictation errors.   Lavonia Drafts, MD 10/22/19 2226

## 2019-10-22 NOTE — ED Provider Notes (Signed)
Patient has all of her medicines except for the oxygen which we have just obtained for her and the Keflex which I have written a prescription for.  She still does not want [place] to take anything but medicines by mouth.  Hospitalist and Dr. Luane School both agree if she is not taking any IV medicines there is no reason for her to be in the hospital.  I will send her out AMA with the oxygen and Keflex.   Nena Polio, MD 10/22/19 [phone]

## 2019-10-22 NOTE — Discharge Instructions (Addendum)
It would be safer for you if you stayed in the hospital but since you will not take anything IV we cannot keep you in the hospital.  Please return if you get worse.  This includes worsening shortness of breath or worsening swelling or redness or fever or feeling sick.  Please take the Keflex 1 pill 3 times a day for your cellulitis.  Please follow-up with your doctor in the next couple days.  Use the oxygen which we have gotten you.  Again please return for any further problems.

## 2019-10-22 NOTE — ED Provider Notes (Signed)
-----------------------------------------   1:10 AM on 10/22/2019 -----------------------------------------  Patient has been evaluated by the hospitalist.  Continues to refuse IV placement.  Lives at a local motel.  States she does not have oxygen other I did review her records and recently it was documented that she is on 3 L continuously.  There is no criteria for hospitalization given that she will only take oral medications.  Will consult TOC for assistance with home oxygen.  She will need to be discharged on Keflex for her cellulitis.  I have ordered this to be given every 8 hours while she is in the ED.   Paulette Blanch, MD 10/22/19 [phone]

## 2019-10-22 NOTE — TOC Initial Note (Signed)
Transition of Care [facility]) - Initial/Assessment Note    Patient Details  Name: Karla Sanchez MRN: [id number] Date of Birth: 05-01-1940  Transition of Care [facility]) CM/SW Contact:    Berenice Bouton, LCSW Phone Number: 10/22/2019, 10:39 AM  Clinical Narrative:    Patient is a 79 year old woman who presented to [facility] ED on 10/22/2019 via [facility] with shortness of breath and right foot redness. EMR records indicate that the patient is on 3 L home oxygen.  Patient has been residing at the [facility] for the past 4-6 months.  Prior to her stay at the [facility] she was living in [place], [place]. [facility].  Patient is high risk for re hospitalizations and frequent ED visits.  [facility] palliative 2019. Patient is known to [facility] department of social services/APS.  According to patient her husband passed away 2 years ago. It is unclear how the patient became displaced/homeless however she was urged to seek and accept assistance from community support groups.    Action taken: Durable medical equipment ordered from [address] [place], [place] [address], [phone].  3L oxygen tank will be delivered to [facility] ED on 10/22/2019.   Plan: Patient to be discharge to [facility] w/DME 3L oxygen.    Expected Discharge Plan: Home/Self Care Barriers to Discharge: No Barriers Identified   Patient Goals and CMS Choice Patient states their goals for this hospitalization and ongoing recovery are:: "Would like to return to [place] but I am gong to stay at the hotel for a while." CMS Medicare.gov Compare Post Acute Care list provided to:: Patient Choice offered to / list presented to : Patient  Expected Discharge Plan and Services Expected Discharge Plan: Home/Self Care In-house Referral: Clinical Social Work   Post Acute Care Choice: Durable Medical Equipment (Patient reported that [facility] services her oxygen needs) Living arrangements for the past 2  months: Hotel/Motel ([place])                 DME Arranged: Oxygen (3L) DME Agency: [employer] [facility] Date DME Agency Contacted: 10/22/19 Time DME Agency Contacted: [id number] Representative spoke with at DME Agency: After hours tele service  [phone]            Prior Living Arrangements/Services Living arrangements for the past 2 months: Hotel/Motel ([place]) Lives with:: Self (Husband passed away about 2 years ago. Patient lives in [place]. Return to [place] a few months ago.) Patient language and need for interpreter reviewed:: No Do you feel safe going back to the place where you live?: Yes      Need for Family Participation in Patient Care: No (Comment) Care giver support system in place?: No (comment)   Criminal Activity/Legal Involvement Pertinent to Current Situation/Hospitalization: No - Comment as needed  Activities of Daily Living      Permission Sought/Granted Permission sought to share information with : Case Manager, Customer service manager Stefani Dama) Permission granted to share information with : Yes, Verbal Permission Granted              Emotional Assessment   Attitude/Demeanor/Rapport: Engaged, Self-Confident Affect (typically observed): Accepting, Appropriate Orientation: : Oriented to Self, Oriented to Place, Oriented to Situation, Fluctuating Orientation (Suspected and/or reported Sundowners) Alcohol / Substance Use: Not Applicable Psych Involvement: No (comment)  Admission diagnosis:  Acute on chronic diastolic CHF (congestive heart failure) ([place]) [I50.33] Patient Active Problem List   Diagnosis Date Noted  . Acute  on chronic diastolic CHF (congestive heart failure) ([place]) 10/22/2019  . Cellulitis of right foot 10/22/2019  . Non compliance with medical treatment   . Chronic respiratory failure with hypoxia ([place])   . Hypertension   . Chronic kidney disease, stage 3b   . Acute on chronic respiratory  failure with hypoxia ([place])   . Diarrhea   . COPD (chronic obstructive pulmonary disease) ([place]) 10/03/2019  . Obesity, Class III, BMI 40-49.9 (morbid obesity) ([place]) 10/03/2019  . Acute exacerbation of CHF (congestive heart failure) ([place]) 09/08/2019  . Acute CHF (congestive heart failure) ([place]) 09/07/2019  . COPD exacerbation ([place]) 07/09/2019  . Chronic diastolic CHF (congestive heart failure) ([place]) 07/09/2019  . Type 2 diabetes mellitus with stage 3 chronic kidney disease ([place]) 07/09/2019  . Peripheral edema 07/09/2019  . Gout 07/09/2019  . Obesity, diabetes, and hypertension syndrome ([place]) 07/09/2019  . Anemia of chronic disease 07/09/2019   PCP:  Alfredia Client, MD Pharmacy:   [facility] [place], [place] [address] Phone: [phone] Fax: [phone]  [place], [place] [address] Phone: [phone] Fax: [phone]     Social Determinants of Health (SDOH) Interventions    Readmission Risk Interventions No flowsheet data found.

## 2019-10-22 NOTE — ED Notes (Signed)
Lunch tray given. Call light at reach. No other needs found at this moment.

## 2019-10-22 NOTE — Consult Note (Signed)
Medical Consultation   Karla Sanchez  JKD:[id number]  DOB: 1940-06-24  DOA: 10/21/2019  PCP: Alfredia Client, MD   Requesting physician: Dr. Archie Balboa (Emergency Medicine)   Reason for consultation: Acute CHF, cellulitis     History of Present Illness: Karla Sanchez is an 79 y.o. female with history of BMI 36, gout, hypertension, diabetes mellitus, COPD, colon cancer status post post partial colectomy, chronic diastolic CHF, chronic kidney disease stage IIIb, chronic hypoxic respiratory failure, and noncompliance with her treatment plan, now presenting to the emergency department with shortness of breath and right foot redness.  Patient reports increase in her chronic dyspnea, reports increased lower extremity swelling bilaterally, and right foot redness without fevers or chills.  In the ED, she was found to be afebrile, saturating upper 90s on her usual 3 L/min of supplemental oxygen, slightly tachypneic, and with stable blood pressure.  EKG features atrial fibrillation.  Chest x-ray with diffuse interstitial opacities most suggestive of CHF.  Chemistry panel with creatinine of 1.14 which appears consistent with her baseline.  CBC with mild microcytic anemia.  Troponin was normal and BNP slightly elevated.  Patient was given Keflex, oral Lasix, and oral metoprolol in the emergency department.  Hospitalist were consulted for admission.  Patient is refusing to have an IV placed, refused breathing treatment, states she wants to be admitted to the hospital for a few days but only wants sleeping medications.  When told that she would not be admitted solely for sleeping medications, she asked to be discharged.   Review of Systems:  ROS As per HPI otherwise 10 point review of systems negative.   Past Medical History: Past Medical History:  Diagnosis Date  . CHF (congestive heart failure) ([place])   . Colon cancer [facility])    s/p right and left colectomy 2020  . COPD  (chronic obstructive pulmonary disease) ([place])   . Diabetes mellitus without complication ([place])   . Diastolic heart failure ([place])   . Gout   . Hypertension     Past Surgical History: Past Surgical History:  Procedure Laterality Date  . COLON SURGERY    . TUBAL LIGATION  1970     Allergies:   Allergies  Allergen Reactions  . Codeine Nausea Only, Other (See Comments) and Swelling    Other Reaction: nausea and vomiting Other reaction(s): NAUSEA   . Other Other (See Comments)    Other reaction(s): SWELLING  Other reaction(s): MUSCLE PAIN Muscle aches Other reaction(s): MUSCLE PAIN Muscle aches Other reaction(s): MUSCLE PAIN Muscle aches   . Exenatide Nausea And Vomiting  . Levofloxacin Other (See Comments)    Other Reaction: sloughing of buccal mucosa Other reaction(s): OTHER Lost skin in mouth Patient states she can take Cipro without difficulty and has taken it many times in the past Other Reaction: sloughing of buccal mucosa   . Losartan Other (See Comments)    Weight gain Weight gain   . Tetracyclines & Related Other (See Comments)     Social History:  reports that she has never smoked. She has never used smokeless tobacco. She reports previous alcohol use. She reports that she does not use drugs.   Family History: Family History  Problem Relation Age of Onset  . Lung cancer Father     Physical Exam: Vitals:   10/21/19 2119 10/21/19 2122  BP: (!) 187/73   Pulse: 70   Resp: (!) 27   Temp:  98.2 F (36.8 C)   TempSrc: Oral   SpO2: 98%   Weight:  (!) 99.8 kg  Height:  5\' 6"  (1.676 m)    Constitutional: Alert and awake, oriented x3, not in any acute distress. Eyes: PERLA, EOMI, irises appear normal, anicteric sclera,  ENMT: external ears and nose appear normal, lips appears normal, oropharynx mucosa, tongue, posterior pharynx appear normal  Neck: neck appears normal, no masses, normal ROM, no thyromegaly, no JVD  CVS: S1-S2. Pitting edema to  bilateral LEs.  Respiratory:  Speaking full sentances, no wheezing, no rhonchi. No accessory muscle use.  Abdomen: soft nontender, nondistended, normal bowel sounds, no hepatosplenomegaly, no hernias  Musculoskeletal: : no cyanosis, clubbing or edema noted bilaterally  Neuro: Cranial nerves II-XII intact, sensation to light touch intact. Moving all extremities.  Psych: Alert, oriented to person, place, and situation. Cantankerous.  Skin: Right foot red and warm without fluctuance or drainage.   Data reviewed:  I have personally reviewed following labs and imaging studies Labs:  CBC: Recent Labs  Lab 10/21/19 2129  WBC 10.4  NEUTROABS 6.0  HGB 10.6*  HCT 37.2  MCV 73.4*  PLT 161    Basic Metabolic Panel: Recent Labs  Lab 10/21/19 2129  NA 139  K 4.6  CL 100  CO2 27  GLUCOSE 135*  BUN 34*  CREATININE 1.14*  CALCIUM 8.4*   GFR Estimated Creatinine Clearance: 47.7 mL/min (A) (by C-G formula based on SCr of 1.14 mg/dL (H)). Liver Function Tests: Recent Labs  Lab 10/21/19 2129  AST 12*  ALT 12  ALKPHOS 102  BILITOT 0.5  PROT 6.4*  ALBUMIN 3.1*   No results for input(s): LIPASE, AMYLASE in the last 168 hours. No results for input(s): AMMONIA in the last 168 hours. Coagulation profile No results for input(s): INR, PROTIME in the last 168 hours.  Cardiac Enzymes: No results for input(s): CKTOTAL, CKMB, CKMBINDEX, TROPONINI in the last 168 hours. BNP: Invalid input(s): POCBNP CBG: No results for input(s): GLUCAP in the last 168 hours. D-Dimer No results for input(s): DDIMER in the last 72 hours. Hgb A1c No results for input(s): HGBA1C in the last 72 hours. Lipid Profile No results for input(s): CHOL, HDL, LDLCALC, TRIG, CHOLHDL, LDLDIRECT in the last 72 hours. Thyroid function studies No results for input(s): TSH, T4TOTAL, T3FREE, THYROIDAB in the last 72 hours.  Invalid input(s): FREET3 Anemia work up No results for input(s): VITAMINB12, FOLATE, FERRITIN,  TIBC, IRON, RETICCTPCT in the last 72 hours. Urinalysis    Component Value Date/Time   COLORURINE STRAW (A) 07/30/2019 2204   APPEARANCEUR CLEAR (A) 07/30/2019 2204   LABSPEC 1.009 07/30/2019 2204   PHURINE 5.0 07/30/2019 2204   GLUCOSEU NEGATIVE 07/30/2019 2204   HGBUR NEGATIVE 07/30/2019 2204   BILIRUBINUR NEGATIVE 07/30/2019 2204   KETONESUR NEGATIVE 07/30/2019 2204   PROTEINUR 100 (A) 07/30/2019 2204   NITRITE NEGATIVE 07/30/2019 [place] 07/30/2019 2204     Microbiology Recent Results (from the past 240 hour(s))  SARS Coronavirus 2 by RT PCR (hospital order, performed in [facility] hospital lab) Nasopharyngeal Nasopharyngeal Swab     Status: None   Collection Time: 10/21/19  9:29 PM   Specimen: Nasopharyngeal Swab  Result Value Ref Range Status   SARS Coronavirus 2 NEGATIVE NEGATIVE Final    Comment: (NOTE) SARS-CoV-2 target nucleic acids are NOT DETECTED.  The SARS-CoV-2 RNA is generally detectable in upper and lower respiratory specimens during the acute phase of infection. The lowest  concentration of SARS-CoV-2 viral copies this assay can detect is 250 copies / mL. A negative result does not preclude SARS-CoV-2 infection and should not be used as the sole basis for treatment or other patient management decisions.  A negative result may occur with improper specimen collection / handling, submission of specimen other than nasopharyngeal swab, presence of viral mutation(s) within the areas targeted by this assay, and inadequate number of viral copies (<250 copies / mL). A negative result must be combined with clinical observations, patient history, and epidemiological information.  Fact Sheet for Patients:   StrictlyIdeas.no  Fact Sheet for Healthcare Providers: BankingDealers.co.za  This test is not yet approved or  cleared by the [place] FDA and has been authorized for detection and/or  diagnosis of SARS-CoV-2 by FDA under an Emergency Use Authorization (EUA).  This EUA will remain in effect (meaning this test can be used) for the duration of the COVID-19 declaration under Section 564(b)(1) of the Act, 21 U.S.C. section 360bbb-3(b)(1), unless the authorization is terminated or revoked sooner.  Performed at [facility], [address] [place], [place] [address]        Inpatient Medications:   Scheduled Meds: . cephALEXin  500 mg Oral Q8H   Continuous Infusions:   Radiological Exams on Admission: DG Chest Portable 1 View  Result Date: 10/21/2019 CLINICAL DATA:  Shortness of breath EXAM: PORTABLE CHEST 1 VIEW COMPARISON:  October 18, 2019 FINDINGS: The heart size and mediastinal contours are unchanged with mild cardiomegaly. There is prominence of the central pulmonary vasculature. Mildly increased interstitial markings are seen throughout both lungs. No pleural effusion is seen. No acute osseous abnormality. IMPRESSION: Diffuse mildly increased interstitial opacities which could be due to interstitial edema and/or infectious etiology. Electronically Signed   By: Prudencio Pair M.D.   On: 10/21/2019 22:32    Impression/Recommendations  1. Acute on chronic diastolic CHF  - Presents with increased SOB and leg swelling, had normal EF in April, has minimally elevated BNP, peripheral edema, and CXR findings consistent with acute CHF  - While patient would likely benefit from observation in hospital for diuresis, she is adamantly refusing to have IV placed, is not decompensated or in any distress, and can double her oral Lasix at home for 3 days, with close outpatient follow-up    2. COPD; chronic hypoxic respiratory failure  - No wheezing in ED  - Continue supplemental O2, inhalers   3. Right foot cellulitis  - She is not septic, there is no leukocytosis or fevers  - Agree with Keflex for mild non-purulent cellulitis   Thank you for this consultation.      Time Spent: 57 minutes.   Ilene Qua Karla Sanchez M.D. [facility] 10/22/2019, 1:58 AM

## 2019-10-22 NOTE — Social Work (Signed)
TOC CM/SW reaching out to [facility] via phone to inquire about the patient's Oxygen tank delivery.  SW spoke to the driver.  Tank was delivered at 24 to [facility].

## 2019-10-23 DIAGNOSIS — M109 Gout, unspecified: Secondary | ICD-10-CM | POA: Diagnosis present

## 2019-10-23 DIAGNOSIS — Z9049 Acquired absence of other specified parts of digestive tract: Secondary | ICD-10-CM | POA: Diagnosis not present

## 2019-10-23 DIAGNOSIS — Z79899 Other long term (current) drug therapy: Secondary | ICD-10-CM | POA: Diagnosis not present

## 2019-10-23 DIAGNOSIS — J810 Acute pulmonary edema: Secondary | ICD-10-CM | POA: Diagnosis not present

## 2019-10-23 DIAGNOSIS — I10 Essential (primary) hypertension: Secondary | ICD-10-CM | POA: Diagnosis not present

## 2019-10-23 DIAGNOSIS — N1832 Chronic kidney disease, stage 3b: Secondary | ICD-10-CM | POA: Diagnosis present

## 2019-10-23 DIAGNOSIS — Z9119 Patient's noncompliance with other medical treatment and regimen: Secondary | ICD-10-CM | POA: Diagnosis not present

## 2019-10-23 DIAGNOSIS — R0602 Shortness of breath: Secondary | ICD-10-CM | POA: Diagnosis present

## 2019-10-23 DIAGNOSIS — Z993 Dependence on wheelchair: Secondary | ICD-10-CM | POA: Diagnosis not present

## 2019-10-23 DIAGNOSIS — E1165 Type 2 diabetes mellitus with hyperglycemia: Secondary | ICD-10-CM | POA: Diagnosis present

## 2019-10-23 DIAGNOSIS — I130 Hypertensive heart and chronic kidney disease with heart failure and stage 1 through stage 4 chronic kidney disease, or unspecified chronic kidney disease: Secondary | ICD-10-CM | POA: Diagnosis present

## 2019-10-23 DIAGNOSIS — Z801 Family history of malignant neoplasm of trachea, bronchus and lung: Secondary | ICD-10-CM | POA: Diagnosis not present

## 2019-10-23 DIAGNOSIS — Z20822 Contact with and (suspected) exposure to covid-19: Secondary | ICD-10-CM | POA: Diagnosis present

## 2019-10-23 DIAGNOSIS — Z9114 Patient's other noncompliance with medication regimen: Secondary | ICD-10-CM | POA: Diagnosis not present

## 2019-10-23 DIAGNOSIS — I5033 Acute on chronic diastolic (congestive) heart failure: Secondary | ICD-10-CM | POA: Diagnosis present

## 2019-10-23 DIAGNOSIS — J449 Chronic obstructive pulmonary disease, unspecified: Secondary | ICD-10-CM | POA: Diagnosis present

## 2019-10-23 DIAGNOSIS — I272 Pulmonary hypertension, unspecified: Secondary | ICD-10-CM | POA: Diagnosis present

## 2019-10-23 DIAGNOSIS — Z794 Long term (current) use of insulin: Secondary | ICD-10-CM | POA: Diagnosis not present

## 2019-10-23 DIAGNOSIS — E1122 Type 2 diabetes mellitus with diabetic chronic kidney disease: Secondary | ICD-10-CM | POA: Diagnosis present

## 2019-10-23 DIAGNOSIS — Z85038 Personal history of other malignant neoplasm of large intestine: Secondary | ICD-10-CM | POA: Diagnosis not present

## 2019-10-23 DIAGNOSIS — Z6841 Body Mass Index (BMI) 40.0 and over, adult: Secondary | ICD-10-CM | POA: Diagnosis not present

## 2019-10-23 LAB — BASIC METABOLIC PANEL
Anion gap: 9 (ref 5–15)
BUN: 36 mg/dL — ABNORMAL HIGH (ref 8–23)
CO2: 32 mmol/L (ref 22–32)
Calcium: 8.3 mg/dL — ABNORMAL LOW (ref 8.9–10.3)
Chloride: 97 mmol/L — ABNORMAL LOW (ref 98–111)
Creatinine, Ser: 1.1 mg/dL — ABNORMAL HIGH (ref 0.44–1.00)
GFR calc Af Amer: 55 mL/min — ABNORMAL LOW (ref >60)
GFR calc non Af Amer: 48 mL/min — ABNORMAL LOW (ref >60)
Glucose, Bld: 203 mg/dL — ABNORMAL HIGH (ref 70–99)
Potassium: 4.8 mmol/L (ref 3.5–5.1)
Sodium: 138 mmol/L (ref 135–145)

## 2019-10-23 LAB — CBC WITH DIFFERENTIAL/PLATELET
Abs Immature Granulocytes: 0.04 10*3/uL (ref 0.00–0.07)
Basophils Absolute: 0.1 10*3/uL (ref 0.0–0.1)
Basophils Relative: 1 %
Eosinophils Absolute: 0.9 10*3/uL — ABNORMAL HIGH (ref 0.0–0.5)
Eosinophils Relative: 10 %
HCT: 40.9 % (ref 36.0–46.0)
Hemoglobin: 11.7 g/dL — ABNORMAL LOW (ref 12.0–15.0)
Immature Granulocytes: 0 %
Lymphocytes Relative: 18 %
Lymphs Abs: 1.6 10*3/uL (ref 0.7–4.0)
MCH: 21 pg — ABNORMAL LOW (ref 26.0–34.0)
MCHC: 28.6 g/dL — ABNORMAL LOW (ref 30.0–36.0)
MCV: 73.6 fL — ABNORMAL LOW (ref 80.0–100.0)
Monocytes Absolute: 0.5 10*3/uL (ref 0.1–1.0)
Monocytes Relative: 6 %
Neutro Abs: 5.8 10*3/uL (ref 1.7–7.7)
Neutrophils Relative %: 65 %
Platelets: 340 10*3/uL (ref 150–400)
RBC: 5.56 MIL/uL — ABNORMAL HIGH (ref 3.87–5.11)
RDW: 20.5 % — ABNORMAL HIGH (ref 11.5–15.5)
WBC: 8.9 10*3/uL (ref 4.0–10.5)
nRBC: 0 % (ref 0.0–0.2)

## 2019-10-23 LAB — GLUCOSE, CAPILLARY
Glucose-Capillary: 204 mg/dL — ABNORMAL HIGH (ref 70–99)
Glucose-Capillary: 79 mg/dL (ref 70–99)
Glucose-Capillary: 71 mg/dL (ref 70–99)

## 2019-10-23 MED ORDER — ALPRAZOLAM 0.5 MG PO TABS
0.5000 mg | ORAL_TABLET | Freq: Three times a day (TID) | ORAL | Status: DC | PRN
  Administered 2019-10-23 – 2019-10-30 (×8): 0.5 mg via ORAL
  Filled 2019-10-23 (×9): qty 1

## 2019-10-23 MED ORDER — LISINOPRIL 10 MG PO TABS
5.0000 mg | ORAL_TABLET | Freq: Every day | ORAL | Status: DC
  Administered 2019-10-23 – 2019-10-26 (×3): 5 mg via ORAL
  Filled 2019-10-23 (×4): qty 1

## 2019-10-23 MED ORDER — GUAIFENESIN-DM 100-10 MG/5ML PO SYRP
5.0000 mL | ORAL_SOLUTION | ORAL | Status: DC | PRN
  Administered 2019-10-23 – 2019-10-29 (×7): 5 mL via ORAL
  Filled 2019-10-23 (×7): qty 5

## 2019-10-23 MED ORDER — HYDRALAZINE HCL 25 MG PO TABS: 25.0000 mg | ORAL_TABLET | Freq: Four times a day (QID) | ORAL | Status: DC | PRN

## 2019-10-23 MED ORDER — INSULIN GLARGINE 100 UNIT/ML ~~LOC~~ SOLN
25.0000 [IU] | Freq: Every day | SUBCUTANEOUS | Status: DC
  Administered 2019-10-25 – 2019-10-30 (×6): 25 [IU] via SUBCUTANEOUS
  Filled 2019-10-23 (×8): qty 0.25

## 2019-10-23 NOTE — ED Notes (Signed)
Pt provided ice chips, and cool wash rags. Pt states "I can't do that right now, don't put that on me" when this RN attempted to place pt back on heart monitor, O2 monitor and BP cuff.

## 2019-10-23 NOTE — ED Notes (Signed)
Pt has inc in anxiety, will not leave cardiac monitor in place continues to take off o2

## 2019-10-23 NOTE — Progress Notes (Signed)
PT Cancellation Note  Patient Details Name: Karla Sanchez MRN: [id number] DOB: 07-22-1940   Cancelled Treatment:    Reason Eval/Treat Not Completed: Medical issues which prohibited therapy;Other (comment). Consult received, chart reviewed. Per OT, pt reported fatigue and some SOB, and does not feel up to exertional activity at this time. Will re-attempt next date as appropriate.   Lieutenant Diego PT, DPT 3:47 PM,10/23/19

## 2019-10-23 NOTE — Progress Notes (Signed)
OT Cancellation Note  Patient Details Name: Karla Sanchez Below MRN: [id number] DOB: 1940-05-04   Cancelled Treatment:    Reason Eval/Treat Not Completed: Fatigue/lethargy limiting ability to participate;Patient declined, no reason specified. Consult received, chart reviewed. Pt appears upset and reports she is having difficulty breathing. Nasal canula in place, vitals stable. Educated in pursed lip breathing and breath recovery after exertion (including talking, drinking, eating, coughing). O2 sats hovering around 90-91%. Pt requested blanket around arms, room temp be turned down to 55*, and requested food tray be moved to sink area. OT complied with requests. Notified nurse tech of pt's request for a personal fan for her room. Pt unable to participate in therapy at this time. Will re-attempt next date as appropriate.   Jeni Salles, MPH, MS, OTR/L ascom [phone] 10/23/19, 3:40 PM

## 2019-10-23 NOTE — Progress Notes (Addendum)
[name] [place] NAME: Karla Sanchez    MR#:  [id number]  DATE OF BIRTH:  1940/07/18  SUBJECTIVE:   Patient admitted with increasing shortness of breath. She is quite anxious telling me she is short of breath. Purse lip breathing. She comes down when she is told to breathe with her mouth close. Sats have been more than 92%. REVIEW OF SYSTEMS:   Review of Systems  Constitutional: Negative for chills, fever and weight loss.  HENT: Negative for ear discharge, ear pain and nosebleeds.   Eyes: Negative for blurred vision, pain and discharge.  Respiratory: Positive for shortness of breath. Negative for sputum production, wheezing and stridor.   Cardiovascular: Positive for orthopnea and leg swelling. Negative for chest pain, palpitations and PND.  Gastrointestinal: Negative for abdominal pain, diarrhea, nausea and vomiting.  Genitourinary: Negative for frequency and urgency.  Musculoskeletal: Negative for back pain and joint pain.  Neurological: Positive for weakness. Negative for sensory change, speech change and focal weakness.  Psychiatric/Behavioral: Negative for depression and hallucinations. The patient is not nervous/anxious.    Tolerating Diet:yes Tolerating PT:   DRUG ALLERGIES:   Allergies  Allergen Reactions  . Codeine Nausea Only, Other (See Comments) and Swelling    Other Reaction: nausea and vomiting Other reaction(s): NAUSEA   . Other Other (See Comments)    Other reaction(s): SWELLING  Other reaction(s): MUSCLE PAIN Muscle aches Other reaction(s): MUSCLE PAIN Muscle aches Other reaction(s): MUSCLE PAIN Muscle aches   . Exenatide Nausea And Vomiting  . Levofloxacin Other (See Comments)    Other Reaction: sloughing of buccal mucosa Other reaction(s): OTHER Lost skin in mouth Patient states she can take Cipro without difficulty and has taken it many times in the past Other Reaction: sloughing of buccal mucosa    . Losartan Other (See Comments)    Weight gain Weight gain   . Tetracyclines & Related Other (See Comments)    VITALS:  Blood pressure (!) 175/71, pulse 72, temperature 98.3 F (36.8 C), temperature source Oral, resp. rate 19, height 5\' 6"  (1.676 m), weight (!) 117 kg, SpO2 100 %.  PHYSICAL EXAMINATION:   Physical Exam  GENERAL:  79 y.o.-year-old patient lying in the bed with no acute distress. obese EYES: Pupils equal, round, reactive to light and accommodation. No scleral icterus.   HEENT: Head atraumatic, normocephalic. Oropharynx and nasopharynx clear.  NECK:  Supple, no jugular venous distention. No thyroid enlargement, no tenderness.  LUNGS:shallow breath sounds bilaterally, no wheezing, rales, rhonchi. No use of accessory muscles of respiration.  CARDIOVASCULAR: S1, S2 normal. No murmurs, rubs, or gallops.  ABDOMEN: Soft, nontender, nondistended. Bowel sounds present. No organomegaly or mass.  EXTREMITIES: No cyanosis, clubbing  ++ edema both LE.    NEUROLOGIC: Cranial nerves II through XII are intact. No focal Motor or sensory deficits b/l.   PSYCHIATRIC:  patient is alert and oriented x 3. Very anxious SKIN: No obvious rash, lesion, or ulcer.   LABORATORY PANEL:  CBC Recent Labs  Lab 10/23/19 1044  WBC 8.9  HGB 11.7*  HCT 40.9  PLT 340    Chemistries  Recent Labs  Lab 10/22/19 2218 10/22/19 2218 10/23/19 1044  NA 139   < > 138  K 4.7   < > 4.8  CL 99   < > 97*  CO2 30   < > 32  GLUCOSE 181*   < > 203*  BUN 38*   < > 36*  CREATININE 1.21*   < > 1.10*  CALCIUM 8.1*   < > 8.3*  AST 13*  --   --   ALT 11  --   --   ALKPHOS 92  --   --   BILITOT 0.5  --   --    < > = values in this interval not displayed.   Cardiac Enzymes No results for input(s): TROPONINI in the last 168 hours. RADIOLOGY:  DG Chest Port 1 View  Result Date: 10/22/2019 CLINICAL DATA:  Shortness of breath. EXAM: PORTABLE CHEST 1 VIEW COMPARISON:  Radiograph yesterday.  Additional  priors. FINDINGS: Stable cardiomegaly and mediastinal contours. Aortic atherosclerosis. Similar vascular congestion. Improved interstitial/bronchial thickening. No focal airspace disease, large pleural effusion or pneumothorax. IMPRESSION: Stable cardiomegaly and vascular congestion. Improved interstitial/bronchial thickening suggesting improved pulmonary edema. Electronically Signed   By: Keith Rake M.D.   On: 10/22/2019 23:02   DG Chest Portable 1 View  Result Date: 10/21/2019 CLINICAL DATA:  Shortness of breath EXAM: PORTABLE CHEST 1 VIEW COMPARISON:  October 18, 2019 FINDINGS: The heart size and mediastinal contours are unchanged with mild cardiomegaly. There is prominence of the central pulmonary vasculature. Mildly increased interstitial markings are seen throughout both lungs. No pleural effusion is seen. No acute osseous abnormality. IMPRESSION: Diffuse mildly increased interstitial opacities which could be due to interstitial edema and/or infectious etiology. Electronically Signed   By: Prudencio Pair M.D.   On: 10/21/2019 22:32   ASSESSMENT AND PLAN:  79 year old female with h/o chronic diastolic CHF,history of insulin dependent type II diabetes, hypertension, and COPD on supplemental oxygen. The patient reports a recent history of recurrent progressive exertional dyspnea. She appears to be medically noncompliant staying in  [place].  Patient was evaluated and was to be admitted but left AMA.  She was sitting in her wheelchair on the wheel chair apparently today when she had a syncopal episode.  She was scared so she came back to the ER.  She still has anasarca.   #1 acute on chronic exacerbation of diastolic CHF:  -Patient appears to have anasarca.   -continue  IV diuretics.   - Dietary counseling will be given. -Appears patient has been noncompliant with her meds. -Continue metoprolol -monitor input output  #2 type 2 diabetes, hyperglycemia, uncontrolled on insulin  Sliding scale  insulin with monitoring of blood sugar -resume Lantus  #3 COPD: - with mild exacerbation continue oxygen, inhalers, nebulizer  #4 chronic kidney disease stage IIIb: Appears to be at baseline  #5 morbid obesity: Dietary counseling.  #6 medication noncompliance: Counseling  #7 hypertension: Blood pressure elevated.  Resume home regimen and adjust  #8 syncope: Patient had syncopal episode which prompted her to come in. Monitor on telemetry. May consult cards in am  PT OT to see patient  DVT prophylaxis: Lovenox Code Status: Full code Family Communication: No family at bedside Disposition Plan: TBD Consults called: None Admission status: Inpatient  Severity of Illness: "           The patient's presenting symptoms include shortness of breath. "           The worrisome physical exam findings include anasarca. "           The initial radiographic and laboratory data are worrisome because of evidence of pulmonary edema. "           The chronic co-morbidities include diabetes and CHF.  TOC for discharge planning-- she lives in a hotel [place]  town  PT OT to see patient        TOTAL TIME TAKING CARE OF THIS PATIENT: **25` minutes.  >50% time spent on counselling and coordination of care  Note: This dictation was prepared with Dragon dictation along with smaller phrase technology. Any transcriptional errors that result from this process are unintentional.  Fritzi Mandes M.D    [facility]   CC: Primary care physician; Alfredia Client, MDPatient ID: Karla Sanchez, female   DOB: 1940-04-25, 79 y.o.   MRN: [id number]

## 2019-10-23 NOTE — Progress Notes (Signed)
Phlebotomy tried to stick this patient twice this morning and refused. She refuses finger sticks as well to check her blood glucose level . She said if she allows they to get a blood sample it would be unit 10 am. She was educated yet still refuses.

## 2019-10-23 NOTE — Progress Notes (Signed)
The patient refused lab work for the third time. Indicated she could not breath. Oxygen has been checked and oxygen level is 93% on 4L. The patient was provided an incentive spirometer and was gotten up in the chair as she wanted. Phlebotomy to will try to obtain lab work later in the day again.

## 2019-10-23 NOTE — ED Notes (Signed)
Pt sitting in bedsidce chair, linens changed, new gown on pt. Pt has water at bedisde

## 2019-10-24 DIAGNOSIS — J810 Acute pulmonary edema: Secondary | ICD-10-CM | POA: Diagnosis not present

## 2019-10-24 DIAGNOSIS — J449 Chronic obstructive pulmonary disease, unspecified: Secondary | ICD-10-CM | POA: Diagnosis not present

## 2019-10-24 DIAGNOSIS — I5033 Acute on chronic diastolic (congestive) heart failure: Secondary | ICD-10-CM | POA: Diagnosis not present

## 2019-10-24 DIAGNOSIS — N1832 Chronic kidney disease, stage 3b: Secondary | ICD-10-CM | POA: Diagnosis not present

## 2019-10-24 LAB — GLUCOSE, CAPILLARY
Glucose-Capillary: 122 mg/dL — ABNORMAL HIGH (ref 70–99)
Glucose-Capillary: 122 mg/dL — ABNORMAL HIGH (ref 70–99)
Glucose-Capillary: 89 mg/dL (ref 70–99)
Glucose-Capillary: 138 mg/dL — ABNORMAL HIGH (ref 70–99)
Glucose-Capillary: 131 mg/dL — ABNORMAL HIGH (ref 70–99)

## 2019-10-24 LAB — BASIC METABOLIC PANEL
Anion gap: 10 (ref 5–15)
BUN: 37 mg/dL — ABNORMAL HIGH (ref 8–23)
CO2: 33 mmol/L — ABNORMAL HIGH (ref 22–32)
Calcium: 8.5 mg/dL — ABNORMAL LOW (ref 8.9–10.3)
Chloride: 97 mmol/L — ABNORMAL LOW (ref 98–111)
Creatinine, Ser: 1.18 mg/dL — ABNORMAL HIGH (ref 0.44–1.00)
GFR calc Af Amer: 51 mL/min — ABNORMAL LOW (ref >60)
GFR calc non Af Amer: 44 mL/min — ABNORMAL LOW (ref >60)
Glucose, Bld: 154 mg/dL — ABNORMAL HIGH (ref 70–99)
Potassium: 4.9 mmol/L (ref 3.5–5.1)
Sodium: 140 mmol/L (ref 135–145)

## 2019-10-24 MED ORDER — FUROSEMIDE 40 MG PO TABS
40.0000 mg | ORAL_TABLET | Freq: Every day | ORAL | Status: DC
  Administered 2019-10-25: 40 mg via ORAL
  Filled 2019-10-24: qty 1

## 2019-10-24 MED ORDER — PREDNISONE 50 MG PO TABS
50.0000 mg | ORAL_TABLET | Freq: Every day | ORAL | Status: DC
  Filled 2019-10-24: qty 1

## 2019-10-24 MED ORDER — FUROSEMIDE 40 MG PO TABS: 40.0000 mg | ORAL_TABLET | Freq: Every day | ORAL | Status: DC

## 2019-10-24 MED ORDER — PREDNISONE 50 MG PO TABS
50.0000 mg | ORAL_TABLET | Freq: Every day | ORAL | Status: DC
  Administered 2019-10-24 – 2019-10-30 (×7): 50 mg via ORAL
  Filled 2019-10-24 (×8): qty 1

## 2019-10-24 NOTE — Progress Notes (Signed)
[facility] [place] NAME: Karla Sanchez    MR#:  [id number]  DATE OF BIRTH:  02/18/1941  SUBJECTIVE:   Patient admitted with increasing shortness of breath. She is quite anxious telling me she is short of breath. Purse lip breathing. She comes down when she is told to breathe with her mouth close. Sats have been more than 92%. REVIEW OF SYSTEMS:   Review of Systems  Constitutional: Negative for chills, fever and weight loss.  HENT: Negative for ear discharge, ear pain and nosebleeds.   Eyes: Negative for blurred vision, pain and discharge.  Respiratory: Positive for shortness of breath. Negative for sputum production, wheezing and stridor.   Cardiovascular: Positive for orthopnea and leg swelling. Negative for chest pain, palpitations and PND.  Gastrointestinal: Negative for abdominal pain, diarrhea, nausea and vomiting.  Genitourinary: Negative for frequency and urgency.  Musculoskeletal: Negative for back pain and joint pain.  Neurological: Positive for weakness. Negative for sensory change, speech change and focal weakness.  Psychiatric/Behavioral: Negative for depression and hallucinations. The patient is not nervous/anxious.    Tolerating Diet:yes Tolerating PT: pending PT since pt not participated yet  DRUG ALLERGIES:   Allergies  Allergen Reactions  . Codeine Nausea Only, Other (See Comments) and Swelling    Other Reaction: nausea and vomiting Other reaction(s): NAUSEA   . Other Other (See Comments)    Other reaction(s): SWELLING  Other reaction(s): MUSCLE PAIN Muscle aches Other reaction(s): MUSCLE PAIN Muscle aches Other reaction(s): MUSCLE PAIN Muscle aches   . Exenatide Nausea And Vomiting  . Levofloxacin Other (See Comments)    Other Reaction: sloughing of buccal mucosa Other reaction(s): OTHER Lost skin in mouth Patient states she can take Cipro without difficulty and has taken it many times in the  past Other Reaction: sloughing of buccal mucosa   . Losartan Other (See Comments)    Weight gain Weight gain   . Tetracyclines & Related Other (See Comments)    VITALS:  Blood pressure 103/75, pulse 79, temperature 99.3 F (37.4 C), temperature source Oral, resp. rate 14, height 5\' 6"  (1.676 m), weight (!) 117 kg, SpO2 93 %.  PHYSICAL EXAMINATION:   Physical Exam  GENERAL:  79 y.o.-year-old patient lying in the bed with no acute distress. obese EYES: Pupils equal, round, reactive to light and accommodation. No scleral icterus.   HEENT: Head atraumatic, normocephalic. Oropharynx and nasopharynx clear.  NECK:  Supple, no jugular venous distention. No thyroid enlargement, no tenderness.  LUNGS:shallow breath sounds bilaterally, no wheezing, rales, rhonchi. No use of accessory muscles of respiration.  CARDIOVASCULAR: S1, S2 normal. No murmurs, rubs, or gallops.  ABDOMEN: Soft, nontender, nondistended. Bowel sounds present. No organomegaly or mass.  EXTREMITIES: No cyanosis, clubbing  ++ edema both LE.    NEUROLOGIC: Cranial nerves II through XII are intact. No focal Motor or sensory deficits b/l.   PSYCHIATRIC:  patient is alert and oriented x 3. Very anxious SKIN: No obvious rash, lesion, or ulcer.   LABORATORY PANEL:  CBC Recent Labs  Lab 10/23/19 1044  WBC 8.9  HGB 11.7*  HCT 40.9  PLT 340    Chemistries  Recent Labs  Lab 10/22/19 2218 10/23/19 1044 10/24/19 0403  NA 139   < > 140  K 4.7   < > 4.9  CL 99   < > 97*  CO2 30   < > 33*  GLUCOSE 181*   < > 154*  BUN 38*   < >  37*  CREATININE 1.21*   < > 1.18*  CALCIUM 8.1*   < > 8.5*  AST 13*  --   --   ALT 11  --   --   ALKPHOS 92  --   --   BILITOT 0.5  --   --    < > = values in this interval not displayed.   Cardiac Enzymes No results for input(s): TROPONINI in the last 168 hours. RADIOLOGY:  DG Chest Port 1 View  Result Date: 10/22/2019 CLINICAL DATA:  Shortness of breath. EXAM: PORTABLE CHEST 1 VIEW  COMPARISON:  Radiograph yesterday.  Additional priors. FINDINGS: Stable cardiomegaly and mediastinal contours. Aortic atherosclerosis. Similar vascular congestion. Improved interstitial/bronchial thickening. No focal airspace disease, large pleural effusion or pneumothorax. IMPRESSION: Stable cardiomegaly and vascular congestion. Improved interstitial/bronchial thickening suggesting improved pulmonary edema. Electronically Signed   By: Keith Rake M.D.   On: 10/22/2019 23:02   ASSESSMENT AND PLAN:  79 year old female with h/o chronic diastolic CHF,history of insulin dependent type II diabetes, hypertension, and COPD on supplemental oxygen. The patient reports a recent history of recurrent progressive exertional dyspnea. She appears to be medically noncompliant staying in  [place].  Patient was evaluated and was to be admitted but left AMA.  She was sitting in her wheelchair on the wheel chair apparently today when she had a syncopal episode.  She was scared so she came back to the ER.  She still has anasarca.   #1 acute on chronic exacerbation of diastolic CHF:  -Patient appears to have anasarca.   -continue  IV diuretics--change to oral lasix tomorrow - Dietary counseling will be given. -Appears patient has been noncompliant with her meds. -Continue metoprolol  #2 type 2 diabetes, hyperglycemia, uncontrolled on insulin  Sliding scale insulin with monitoring of blood sugar -resume Lantus  #3 COPD: - with mild exacerbation continue oxygen, inhalers, nebulizer  #4 chronic kidney disease stage IIIb: Appears to be at baseline  #5 morbid obesity: Dietary counseling.  #6 medication noncompliance: Counseling  #7 hypertension: Blood pressure elevated.  Resume home regimen and adjust  #8 syncope: Patient had syncopal episode which prompted her to come in. Monitor on telemetry. May consult cards in am  PT OT to see patient--pt has been refusing to see therapy on several attempts.  Per TOC she wants to return back to [place] [facility].  She has been noncompliant with taking po meds here--she wants to be left alone and wants to sleep.  DVT prophylaxis: Lovenox Code Status: Full code Family Communication: No family at bedside Disposition Plan: TBD Consults called: None Admission status: Inpatient  TOC for discharge planning-- she lives in a hotel in town  PT OT to see patient--pt has not yet participated  She will return tomorrow to [facility] inn with oxygen(already has been set up)    TOTAL TIME TAKING CARE OF THIS PATIENT: **25` minutes.  >50% time spent on counselling and coordination of care  Note: This dictation was prepared with Dragon dictation along with smaller phrase technology. Any transcriptional errors that result from this process are unintentional.  Fritzi Mandes M.D    [facility]   CC: Primary care physician; Alfredia Client, MDPatient ID: Karla Sanchez, female   DOB: 08-18-1940, 79 y.o.   MRN: [id number]

## 2019-10-24 NOTE — Evaluation (Signed)
Physical Therapy Evaluation Patient Details Name: Karla Sanchez MRN: [id number] DOB: [birth year] Today's Date: 10/24/2019   History of Present Illness  Pt is a 79 y.o. female with a known history of diastolic heart failure, COPD, type 2 diabetes mellitus and hypertension, presented to the emergency room with worsening dyspnea for the last several days without significant cough or wheezing.  She has been having dyspnea on exertion and worsening lower extremity edema. Pt uses a WC at baseline. Performs 2-3 step MOD I transfers to/from [facility] and propels with feet.    Clinical Impression  Patient alert, sitting on BSC with CNA in room, PT requested to take over to assess mobility. Pt able to sit on BSC with fair sitting balance for several minutes for BM. Able to transfer into standing with bilateral UE support, total assist for wiping. Stand pivot with hands placed on chair arms, CGA. Fatigued quickly, and SOB, on 4L throughout for mobility (spO2 92-94%). Pt repositioned up in chair with all needs in reach. At baseline pt is modI for all ADLs, uses manual wheelchair for mobility, does not use oxygen. Pt reported she feels more fatigued and weaker than normal. The patient would benefit from further skilled PT intervention to maximize mobility, independence, and safety. Recommendation is SNF due to decreased caregiver support and decreased functional mobility/endurance.    Follow Up Recommendations SNF    Equipment Recommendations  None recommended by PT    Recommendations for Other Services       Precautions / Restrictions Precautions Precautions: Fall Restrictions Weight Bearing Restrictions: No      Mobility  Bed Mobility               General bed mobility comments: pt on BSC and to chair at end of session  Transfers Overall transfer level: Needs assistance Equipment used: None Transfers: Sit to/from Stand;Stand Pivot Transfers Sit to Stand: Min guard Stand pivot transfers: Min  assist       General transfer comment: able to return to chair from [facility], SOB, fatigued  Ambulation/Gait             General Gait Details: deferred pt is non ambulatory at baseline  Stairs            Wheelchair Mobility    Modified Rankin (Stroke Patients Only)       Balance Overall balance assessment: Needs assistance Sitting-balance support: Feet supported Sitting balance-Leahy Scale: Fair       Standing balance-Leahy Scale: Poor Standing balance comment: reliant on UE support                             Pertinent Vitals/Pain Pain Assessment: Faces Faces Pain Scale: Hurts a little bit Pain Location: with mobility Pain Descriptors / Indicators: Aching Pain Intervention(s): Limited activity within patient's tolerance;Premedicated before session;Repositioned    Home Living Family/patient expects to be discharged to:: Other (Comment) [facility])                      Prior Function           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 and can only take 2 steps for transfers; does not walk d/t this and does not stand for any length of time d/t this--pt reports it will "crush" the bones in  her feet)     Hand Dominance   Dominant Hand: Right    Extremity/Trunk Assessment   Upper Extremity Assessment Upper Extremity Assessment: Generalized weakness    Lower Extremity Assessment Lower Extremity Assessment: Generalized weakness    Cervical / Trunk Assessment Cervical / Trunk Assessment: Kyphotic  Communication   Communication: No difficulties  Cognition Arousal/Alertness: Awake/alert Behavior During Therapy: Anxious Overall Cognitive Status: Within Functional Limits for tasks assessed                                        General Comments      Exercises Other  Exercises Other Exercises: Pt needed totalA for wiping after BM in standing by BSC.   Assessment/Plan    PT Assessment Patient needs continued PT services  PT Problem List Decreased strength;Decreased activity tolerance;Decreased mobility       PT Treatment Interventions DME instruction;Balance training;Gait training;Neuromuscular re-education;Patient/family education;Functional mobility training;Therapeutic activities;Wheelchair mobility training;Therapeutic exercise    PT Goals (Current goals can be found in the Care Plan section)  Acute Rehab PT Goals Patient Stated Goal: to return to PLOF PT Goal Formulation: With patient Time For Goal Achievement: 11/07/19 Potential to Achieve Goals: Good    Frequency Min 2X/week   Barriers to discharge        Co-evaluation               AM-PAC PT "6 Clicks" Mobility  Outcome Measure Help needed turning from your back to your side while in a flat bed without using bedrails?: A Lot Help needed moving from lying on your back to sitting on the side of a flat bed without using bedrails?: A Lot Help needed moving to and from a bed to a chair (including a wheelchair)?: A Little Help needed standing up from a chair using your arms (e.g., wheelchair or bedside chair)?: A Little Help needed to walk in hospital room?: Total Help needed climbing 3-5 steps with a railing? : Total 6 Click Score: 12    End of Session Equipment Utilized During Treatment: Gait belt Activity Tolerance: Patient limited by fatigue Patient left: in chair;with call bell/phone within reach;with chair alarm set Nurse Communication: Mobility status PT Visit Diagnosis: Other abnormalities of gait and mobility (R26.89)    Time: 4665-9935 PT Time Calculation (min) (ACUTE ONLY): 20 min   Charges:   PT Evaluation $PT Eval Moderate Complexity: 1 Mod        Lieutenant Diego PT, DPT 4:06 PM,10/24/19

## 2019-10-24 NOTE — Progress Notes (Signed)
PT Cancellation Note  Patient Details Name: Karla Sanchez MRN: [id number] DOB: April 28, 1940   Cancelled Treatment:    Reason Eval/Treat Not Completed: Other (comment). Pt refused PT this AM, stated she is not up for it right now. Seated up in chair. Pt provided with comfort measures, PT to re-attempt as able.   Lieutenant Diego PT, DPT 10:23 AM,10/24/19

## 2019-10-24 NOTE — Progress Notes (Addendum)
IV is leaking and IV lasix dose due at 1800 could not be given. IV team consult has been placed. The patient refuses to have new IV. I have eduated her about the importance of taking lasix order and the importance of a new IV for emergency purposes yet she still refuses. She repeats that she refused IV at the ED and is not going to allow another IV to be obtained. Indicates she is breathing better now. MD has been notified.

## 2019-10-24 NOTE — TOC Progression Note (Signed)
Transition of Care [facility]) - Progression Note    Patient Details  Name: Karla Sanchez MRN: [id number] Date of Birth: 1940/10/25  Transition of Care [facility]) CM/SW Contact  Beverly Sessions, RN Phone Number: 10/24/2019, 3:06 PM  Clinical Narrative:    Patient very sleepy during my time with her Patient was assessed by [facility] on 8/1 "Patient has been residing at the [place] for the past 4-6 months.  Prior to her stay at the [place] she was living in [place], [place]. [facility].  Patient is high risk for re hospitalizations and frequent ED visits.  [facility] palliative 2019. Patient is known to [facility] department of social services/APS.  According to patient her husband passed away 2 years ago. It is unclear how the patient became displaced/homeless however she was urged to seek and accept assistance from community support groups.  "  Patient confirms she lives at the [facility]  She states "they are like family to me".  Yesterday with patient spoke with MD she was agreeable to SNF.  Today patient adamantly declines SNF placement and states she will be going back to the hotel . MD update.  MD followed up with patient and she confirms she will be going back to the hotel.  Patient agreeable to home health services.  [facility] is unable to accept back .  TOC to check with other home health agencies to see if anyone can accept.    Per previous records patient is followed by DSS worker Peggye Form.  Per DSS rep Margreta Journey is no longer with DSS.  I was transferred to her supervisor [employer].  VM left    Expected Discharge Plan: [place]    Expected Discharge Plan and Services Expected Discharge Plan: [place] arrangements for the past 2 months: Hotel/Motel                                       Social Determinants of Health (SDOH) Interventions    Readmission Risk  Interventions Readmission Risk Prevention Plan 10/24/2019  Medication Review ([place]) Complete  HRI or [place] Patient Refused

## 2019-10-24 NOTE — Progress Notes (Signed)
OT Cancellation Note  Patient Details Name: Karla Sanchez MRN: [id number] DOB: 1940/09/25   Cancelled Treatment:    Reason Eval/Treat Not Completed: Patient declined, no reason specified;Fatigue/lethargy limiting ability to participate. Upon arrival pt seated in chair, care management in room attempting to engage pt in conversation - pt reports fatigued and declines OT session. Will follow up at later date/time as able.   Dessie Coma, M.S. OTR/L  10/24/19, 12:14 PM  ascom [phone]

## 2019-10-24 NOTE — Progress Notes (Signed)
The patient's nephew has called and indicated he is the patient's POA. [name] [phone]. He has mentioned he has the paper work he has sent electronically but is willing to sent it again if needed.  He wants to speak with case management/social worker about the plan for the patient if he can be contacted tomorrow 10/25/19.

## 2019-10-25 DIAGNOSIS — J449 Chronic obstructive pulmonary disease, unspecified: Secondary | ICD-10-CM | POA: Diagnosis not present

## 2019-10-25 DIAGNOSIS — I10 Essential (primary) hypertension: Secondary | ICD-10-CM

## 2019-10-25 DIAGNOSIS — J810 Acute pulmonary edema: Secondary | ICD-10-CM | POA: Diagnosis not present

## 2019-10-25 DIAGNOSIS — N1832 Chronic kidney disease, stage 3b: Secondary | ICD-10-CM | POA: Diagnosis not present

## 2019-10-25 DIAGNOSIS — E1121 Type 2 diabetes mellitus with diabetic nephropathy: Secondary | ICD-10-CM

## 2019-10-25 DIAGNOSIS — Z794 Long term (current) use of insulin: Secondary | ICD-10-CM

## 2019-10-25 DIAGNOSIS — I5033 Acute on chronic diastolic (congestive) heart failure: Secondary | ICD-10-CM | POA: Diagnosis not present

## 2019-10-25 LAB — GLUCOSE, CAPILLARY
Glucose-Capillary: 201 mg/dL — ABNORMAL HIGH (ref 70–99)
Glucose-Capillary: 139 mg/dL — ABNORMAL HIGH (ref 70–99)
Glucose-Capillary: 135 mg/dL — ABNORMAL HIGH (ref 70–99)
Glucose-Capillary: 387 mg/dL — ABNORMAL HIGH (ref 70–99)

## 2019-10-25 LAB — BASIC METABOLIC PANEL
Anion gap: 11 (ref 5–15)
BUN: 50 mg/dL — ABNORMAL HIGH (ref 8–23)
CO2: 30 mmol/L (ref 22–32)
Calcium: 8.3 mg/dL — ABNORMAL LOW (ref 8.9–10.3)
Chloride: 93 mmol/L — ABNORMAL LOW (ref 98–111)
Creatinine, Ser: 1.58 mg/dL — ABNORMAL HIGH (ref 0.44–1.00)
GFR calc Af Amer: 36 mL/min — ABNORMAL LOW (ref >60)
GFR calc non Af Amer: 31 mL/min — ABNORMAL LOW (ref >60)
Glucose, Bld: 291 mg/dL — ABNORMAL HIGH (ref 70–99)
Potassium: 4.7 mmol/L (ref 3.5–5.1)
Sodium: 134 mmol/L — ABNORMAL LOW (ref 135–145)

## 2019-10-25 MED ORDER — FUROSEMIDE 10 MG/ML IJ SOLN
80.0000 mg | Freq: Two times a day (BID) | INTRAMUSCULAR | Status: DC
  Administered 2019-10-25 – 2019-10-26 (×2): 80 mg via INTRAVENOUS
  Filled 2019-10-25 (×2): qty 8

## 2019-10-25 NOTE — Progress Notes (Signed)
PROGRESS NOTE  Karla Sanchez ERD:[id number] DOB: January 22, 1941 DOA: 10/22/2019 PCP: Alfredia Client, MD  HPI/Recap of past 24 hours: HPI from Dr Posey Pronto 79 year old female with h/o chronic diastolic CHF,history of insulin dependent type II diabetes, hypertension, and COPD on supplemental oxygen. The patient reports a recent history of recurrent progressive exertional dyspnea. She appears to be medically noncompliant staying in [place]. Patient was evaluated and was to be admitted but left AMA. She was sitting in her wheelchair on the wheel chair apparently today when she had a possible syncopal episode. She was scared so she came back to the ER. She still has anasarca, appears overloaded.   Today, patient reported feeling drowsy, but was alert, awake enough to engage in a meaningful conversation.  Patient still reports shortness of breath, denies any chest pain, abdominal pain, nausea/vomiting, fever/chills.  Patient noted to be verbally abusive to staff, refusing medical management.  Assessment/Plan: Principal Problem:   Acute exacerbation of CHF (congestive heart failure) (HCC) Active Problems:   Type 2 diabetes mellitus with stage 3 chronic kidney disease (HCC)   Obesity, diabetes, and hypertension syndrome (HCC)   COPD (chronic obstructive pulmonary disease) (HCC)   Obesity, Class III, BMI 40-49.9 (morbid obesity) ([place])   Hypertension   Chronic kidney disease, stage 3b   Non compliance with medical treatment   Acute pulmonary edema (HCC)   Acute on chronic exacerbation of diastolic HF Patient appears to be volume overloaded BNP 93.1, but may be falsely low in obese patients Chest x-ray with vascular congestion Echo done on 06/2019 showed EF of 55 to 56%, grade 2 diastolic dysfunction, moderate pulmonary hypertension 48.4 mmHg Restarts IV Lasix Strict I's and O's, daily weights Monitor electrolytes closely, telemetry  Possible syncope Continue to monitor on  telemetry Echo as above  Diabetes mellitus type 2, uncontrolled with hyperglycemia A1c on 09/2019 was 8 SSI, Lantus, Accu-Cheks, hypoglycemic protocol  CKD stage IIIb Creatinine with a slight bump, likely 2/2 renal congestion, hoping to improve with IV diuresis Daily BMP  Hypertension Continue metoprolol, lisinopril  COPD Continue inhalers, nebulizers, prednisone Supplemental oxygen as needed  Morbid obesity Lifestyle modification advised        Malnutrition Type:      Malnutrition Characteristics:      Nutrition Interventions:       Estimated body mass index is 41.63 kg/m as calculated from the following:   Height as of this encounter: 5\' 6"  (1.676 m).   Weight as of this encounter: 117 kg.     Code Status: Full  Family Communication: None at bedside  Disposition Plan: Status is: Inpatient  Remains inpatient appropriate because:Inpatient level of care appropriate due to severity of illness   Dispo: The patient is from: Home-lives in a hotel              Anticipated d/c is to: SNF              Anticipated d/c date is: 2 days              Patient currently is not medically stable to d/c.   Consultants: None  Procedures:  None  Antimicrobials:  None  DVT prophylaxis: Lovenox   Objective: Vitals:   10/24/19 2323 10/24/19 2338 10/25/19 0344 10/25/19 1153  BP:   (!) 125/48 (!) 125/52  Pulse: 89 74 87 (!) 56  Resp:   20 20  Temp:   97.7 F (36.5 C) 98.9 F (37.2 C)  TempSrc:   Oral  SpO2: 92% 93% 93% 100%  Weight:      Height:        Intake/Output Summary (Last 24 hours) at 10/25/2019 1906 Last data filed at 10/25/2019 1002 Gross per 24 hour  Intake 240 ml  Output --  Net 240 ml   Filed Weights   10/22/19 2145 10/23/19 0313  Weight: (!) 99.8 kg (!) 117 kg    Exam:  General: NAD   Cardiovascular: S1, S2 present  Respiratory:  Diminished breath sounds bilaterally, with bibasilar crackles  Abdomen: Soft, nontender,  nondistended, bowel sounds present  Musculoskeletal: 3+ bilateral pedal edema noted  Skin: Normal  Psychiatry: Normal mood    Data Reviewed: CBC: Recent Labs  Lab 10/21/19 2129 10/22/19 2218 10/23/19 1044  WBC 10.4 9.6 8.9  NEUTROABS 6.0  --  5.8  HGB 10.6* 10.1* 11.7*  HCT 37.2 33.6* 40.9  MCV 73.4* 71.9* 73.6*  PLT 353 320 476   Basic Metabolic Panel: Recent Labs  Lab 10/21/19 2129 10/22/19 2218 10/23/19 1044 10/24/19 0403 10/25/19 0857  NA 139 139 138 140 134*  K 4.6 4.7 4.8 4.9 4.7  CL 100 99 97* 97* 93*  CO2 27 30 32 33* 30  GLUCOSE 135* 181* 203* 154* 291*  BUN 34* 38* 36* 37* 50*  CREATININE 1.14* 1.21* 1.10* 1.18* 1.58*  CALCIUM 8.4* 8.1* 8.3* 8.5* 8.3*   GFR: Estimated Creatinine Clearance: 37.6 mL/min (A) (by C-G formula based on SCr of 1.58 mg/dL (H)). Liver Function Tests: Recent Labs  Lab 10/21/19 2129 10/22/19 2218  AST 12* 13*  ALT 12 11  ALKPHOS 102 92  BILITOT 0.5 0.5  PROT 6.4* 5.8*  ALBUMIN 3.1* 2.8*   No results for input(s): LIPASE, AMYLASE in the last 168 hours. No results for input(s): AMMONIA in the last 168 hours. Coagulation Profile: No results for input(s): INR, PROTIME in the last 168 hours. Cardiac Enzymes: No results for input(s): CKTOTAL, CKMB, CKMBINDEX, TROPONINI in the last 168 hours. BNP (last 3 results) No results for input(s): PROBNP in the last 8760 hours. HbA1C: No results for input(s): HGBA1C in the last 72 hours. CBG: Recent Labs  Lab 10/24/19 1644 10/24/19 2109 10/25/19 0756 10/25/19 1155 10/25/19 1642  GLUCAP 138* 131* 201* 139* 135*   Lipid Profile: No results for input(s): CHOL, HDL, LDLCALC, TRIG, CHOLHDL, LDLDIRECT in the last 72 hours. Thyroid Function Tests: No results for input(s): TSH, T4TOTAL, FREET4, T3FREE, THYROIDAB in the last 72 hours. Anemia Panel: No results for input(s): VITAMINB12, FOLATE, FERRITIN, TIBC, IRON, RETICCTPCT in the last 72 hours. Urine analysis:    Component  Value Date/Time   COLORURINE STRAW (A) 07/30/2019 2204   APPEARANCEUR CLEAR (A) 07/30/2019 2204   LABSPEC 1.009 07/30/2019 2204   PHURINE 5.0 07/30/2019 [place] 07/30/2019 2204   HGBUR NEGATIVE 07/30/2019 2204   [place] 07/30/2019 2204   KETONESUR NEGATIVE 07/30/2019 2204   PROTEINUR 100 (A) 07/30/2019 2204   NITRITE NEGATIVE 07/30/2019 2204   LEUKOCYTESUR NEGATIVE 07/30/2019 2204   Sepsis Labs: @LABRCNTIP (procalcitonin:4,lacticidven:4)  ) Recent Results (from the past 240 hour(s))  SARS Coronavirus 2 by RT PCR (hospital order, performed in [place] hospital lab) Nasopharyngeal Nasopharyngeal Swab     Status: None   Collection Time: 10/21/19  9:29 PM   Specimen: Nasopharyngeal Swab  Result Value Ref Range Status   SARS Coronavirus 2 NEGATIVE NEGATIVE Final    Comment: (NOTE) SARS-CoV-2 target nucleic acids are NOT DETECTED.  The SARS-CoV-2 RNA  is generally detectable in upper and lower respiratory specimens during the acute phase of infection. The lowest concentration of SARS-CoV-2 viral copies this assay can detect is 250 copies / mL. A negative result does not preclude SARS-CoV-2 infection and should not be used as the sole basis for treatment or other patient management decisions.  A negative result may occur with improper specimen collection / handling, submission of specimen other than nasopharyngeal swab, presence of viral mutation(s) within the areas targeted by this assay, and inadequate number of viral copies (<250 copies / mL). A negative result must be combined with clinical observations, patient history, and epidemiological information.  Fact Sheet for Patients:   StrictlyIdeas.no  Fact Sheet for Healthcare Providers: BankingDealers.co.za  This test is not yet approved or  cleared by the [place] FDA and has been authorized for detection and/or diagnosis of SARS-CoV-2 by FDA  under an Emergency Use Authorization (EUA).  This EUA will remain in effect (meaning this test can be used) for the duration of the COVID-19 declaration under Section 564(b)(1) of the Act, 21 U.S.C. section 360bbb-3(b)(1), unless the authorization is terminated or revoked sooner.  Performed at [facility], [address], [place], [place] [address]       Studies: No results found.  Scheduled Meds:  colchicine  0.6 mg Oral Daily   enoxaparin (LOVENOX) injection  40 mg Subcutaneous Q24H   furosemide  40 mg Oral Daily   insulin aspart  0-20 Units Subcutaneous TID WC   insulin aspart  0-5 Units Subcutaneous QHS   insulin glargine  25 Units Subcutaneous QHS   lisinopril  5 mg Oral Daily   loratadine  10 mg Oral Daily   metoprolol tartrate  50 mg Oral BID   predniSONE  50 mg Oral Q breakfast    Continuous Infusions:  sodium chloride       LOS: 2 days     Alma Friendly, MD [facility]  If 7PM-7AM, please contact night-coverage [email or web address] 10/25/2019, 7:06 PM

## 2019-10-25 NOTE — Progress Notes (Signed)
OT Cancellation Note  Patient Details Name: Keah Lamba MRN: [id number] DOB: 01-21-1941   Cancelled Treatment:    Reason Eval/Treat Not Completed: Patient declined, no reason specified;Fatigue/lethargy limiting ability to participate. Order received and chart reviewed. OT attempted to see pt for evaluation this am. Upon arrival to room, pt seated in recliner, appears to be sleeping. Pt wakes to VCs but remains lethargic, states "They just gave me something to help me sleep" and politely declines OT evaluation at this time. Pt also states "I think I'm dying", OT utilizes therapeutic use of self to provide emotional support/encouragement. Pt given water cup from tray; pt declines further needs at this time. Pt agreeable to OT evaluation later this date. Will re-attempt as able and pt medically appropriate.   Shara Blazing, M.S., OTR/L Ascom: [phone] 10/25/19, 9:27 AM

## 2019-10-25 NOTE — Progress Notes (Signed)
Patient is refusing an IV start at this time. Tia RN is aware.

## 2019-10-25 NOTE — Progress Notes (Signed)
Patient refused care. She is angry because we lowered bed to change bedding, has been wet all day. Started using verbally abusive language, cursing at this nurse and the nurse tech, Donavan Foil. Threatened law suit and tried throwing a cup at [place]. Was mad because she was not notified about her dinner being in room. Yelled that she was trying to sleep, patient has been very demanding and frequently calling out today. She is on rotate list. This behavior is new this afternoon. Did not receive evening insulin due to verbally and physically threatening behavior. Informed patient that I would let the MD know of behavior and was told to "tell the GD doctor and F yourself while you are at it."

## 2019-10-25 NOTE — TOC Progression Note (Addendum)
Transition of Care [facility]) - Progression Note    Patient Details  Name: Daven Pinckney MRN: [id number] Date of Birth: April 24, 1940  Transition of Care [facility]) CM/SW Contact  Beverly Sessions, RN Phone Number: 10/25/2019, 10:45 AM  Clinical Narrative:    Confirmed with patient she still wants to return to her hotel room at discharge  [facility] with [facility] can accept for RN, PT, aide. Patient updated   Update:  Received return call from [employer].  She states that the case was closed last month due to patient having capacity, and continuing to decline resources.   RNCM confirmed with MD that patient has decision making ability.   RNCM reached out to nephew Annie Main with patients permission, and updated him on the patient's current wishes  Expected Discharge Plan: [place]    Expected Discharge Plan and Services Expected Discharge Plan: [place]       Living arrangements for the past 2 months: Hotel/Motel                                       Social Determinants of Health (SDOH) Interventions    Readmission Risk Interventions Readmission Risk Prevention Plan 10/24/2019  Medication Review ([place]) Complete  HRI or [place] Patient Refused

## 2019-10-26 DIAGNOSIS — I5033 Acute on chronic diastolic (congestive) heart failure: Secondary | ICD-10-CM | POA: Diagnosis not present

## 2019-10-26 DIAGNOSIS — N1832 Chronic kidney disease, stage 3b: Secondary | ICD-10-CM | POA: Diagnosis not present

## 2019-10-26 DIAGNOSIS — J449 Chronic obstructive pulmonary disease, unspecified: Secondary | ICD-10-CM | POA: Diagnosis not present

## 2019-10-26 DIAGNOSIS — J810 Acute pulmonary edema: Secondary | ICD-10-CM | POA: Diagnosis not present

## 2019-10-26 LAB — BASIC METABOLIC PANEL
Anion gap: 11 (ref 5–15)
BUN: 67 mg/dL — ABNORMAL HIGH (ref 8–23)
CO2: 29 mmol/L (ref 22–32)
Calcium: 8.7 mg/dL — ABNORMAL LOW (ref 8.9–10.3)
Chloride: 94 mmol/L — ABNORMAL LOW (ref 98–111)
Creatinine, Ser: 1.66 mg/dL — ABNORMAL HIGH (ref 0.44–1.00)
GFR calc Af Amer: 34 mL/min — ABNORMAL LOW (ref >60)
GFR calc non Af Amer: 29 mL/min — ABNORMAL LOW (ref >60)
Glucose, Bld: 156 mg/dL — ABNORMAL HIGH (ref 70–99)
Potassium: 4.9 mmol/L (ref 3.5–5.1)
Sodium: 134 mmol/L — ABNORMAL LOW (ref 135–145)

## 2019-10-26 LAB — GLUCOSE, CAPILLARY
Glucose-Capillary: 142 mg/dL — ABNORMAL HIGH (ref 70–99)
Glucose-Capillary: 174 mg/dL — ABNORMAL HIGH (ref 70–99)
Glucose-Capillary: 182 mg/dL — ABNORMAL HIGH (ref 70–99)
Glucose-Capillary: 210 mg/dL — ABNORMAL HIGH (ref 70–99)
Glucose-Capillary: 188 mg/dL — ABNORMAL HIGH (ref 70–99)

## 2019-10-26 MED ORDER — FUROSEMIDE 10 MG/ML IJ SOLN: 60.0000 mg | Freq: Two times a day (BID) | INTRAMUSCULAR | Status: DC

## 2019-10-26 MED ORDER — FUROSEMIDE 10 MG/ML IJ SOLN
40.0000 mg | Freq: Two times a day (BID) | INTRAMUSCULAR | Status: DC
  Administered 2019-10-26 – 2019-10-27 (×2): 40 mg via INTRAVENOUS
  Filled 2019-10-26 (×2): qty 4

## 2019-10-26 MED ORDER — GUAIFENESIN ER 600 MG PO TB12
600.0000 mg | ORAL_TABLET | Freq: Two times a day (BID) | ORAL | Status: DC
  Administered 2019-10-26 – 2019-10-30 (×7): 600 mg via ORAL
  Filled 2019-10-26 (×11): qty 1

## 2019-10-26 NOTE — Progress Notes (Signed)
SCD's were offered to patient she refused because she wanted to sleep. This nurse and the tech also offered to get her up to bedside commode and bladder scan her but she refused both and stated she already went but refused to get up to be cleaned. She just wanted to sleep.

## 2019-10-26 NOTE — Progress Notes (Signed)
PT Cancellation Note  Patient Details Name: Karla Sanchez MRN: [id number] DOB: [birth year]   Cancelled Treatment:    Reason Eval/Treat Not Completed: Other (comment) (OT in room at this time to work with patient, pt up in chair. PT to reattempt as able.)   Lieutenant Diego PT, DPT 10:57 AM,10/26/19

## 2019-10-26 NOTE — Evaluation (Signed)
Occupational Therapy Evaluation Patient Details Name: Karla Sanchez MRN: [id number] DOB: 13-Jun-1940 Today's Date: 10/26/2019    History of Present Illness Pt is a 79 y.o. female with a known history of diastolic heart failure, COPD, type 2 diabetes mellitus and hypertension, presented to the emergency room with worsening dyspnea for the last several days without significant cough or wheezing.  She has been having dyspnea on exertion and worsening lower extremity edema. Pt uses a WC at baseline. Performs 2-3 step MOD I transfers to/from [facility] and propels with feet.   Clinical Impression   Pt seen for OT evaluation this date. She reports she is MOD I with w/c at baseline and currently staying at a holiday [employer] (reports she was looking to buy a home here). Pt very much likes to direct her care and can somewhat self-limit t/o session. Overall she is pleasant and somewhat participatory throughout. Pt demos ability to perform LB ADLs in sitting with no AE with cross-leg technique. Pt able to perform all seated UB ADLs with setup to Indep level. Pt declines to get out of recliner at this time citing L lower back pain and "trying to wake up". Anticipate d/t low fxl activity tolerance that pt could benefit from SNF to increase strength, tolerance and safety and reduce fall risk.     Follow Up Recommendations  SNF    Equipment Recommendations  Other (comment) (defer to next level of care)    Recommendations for Other Services       Precautions / Restrictions Precautions Precautions: Fall Restrictions Weight Bearing Restrictions: No      Mobility Bed Mobility               General bed mobility comments: up to chair pre/post  Transfers                 General transfer comment: pt declines to participate in OT assessment of transfers at this time    Balance Overall balance assessment: Needs assistance Sitting-balance support: Feet supported Sitting balance-Leahy Scale: Good                                      ADL either performed or assessed with clinical judgement   ADL Overall ADL's : Needs assistance/impaired                                       General ADL Comments: able to perform seated UB ADLs with setup, declines to stand/perform OOC ADLs this date. States she primarily sits in w/c and does all self care in sitting at baseline. Demos ability to perform LB dressing with setup and extended time in sitting.     Vision Baseline Vision/History: Wears glasses Wears Glasses: At all times Patient Visual Report: No change from baseline       Perception     Praxis      Pertinent Vitals/Pain Pain Assessment: Faces Faces Pain Scale: Hurts little more Pain Location: L lower back Pain Descriptors / Indicators: Sore Pain Intervention(s): Limited activity within patient's tolerance;Monitored during session     Hand Dominance Right   Extremity/Trunk Assessment Upper Extremity Assessment Upper Extremity Assessment: Generalized weakness;Overall [facility] for tasks assessed   Lower Extremity Assessment Lower Extremity Assessment: Defer to PT evaluation;Generalized weakness       Communication Communication Communication:  No difficulties   Cognition Arousal/Alertness: Awake/alert Behavior During Therapy: WFL for tasks assessed/performed Overall Cognitive Status: Within Functional Limits for tasks assessed                                     General Comments       Exercises Other Exercises Other Exercises: OT facilitates education re: role of OT and importance of OOB activity to reduce risk of muscle atrophy. Pt with good understanding, but limited interest/reception.   Shoulder Instructions      Home Living Family/patient expects to be discharged to:: Other (Comment) (holiday inn)                                 Additional Comments: Living at hotel but wants to go to retirement  facility/ALF      Prior Functioning/Environment Level of Independence: Needs assistance  Gait / Transfers Assistance Needed: Uses heels of B feet to propel self in manual w/c (doesn't use hands to propel w/c d/t h/o hand issues); has power w/c but is in the "shop" and prefers to use manual w/c.     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 and can only take 2 steps for transfers; does not walk d/t this and does not stand for any length of time d/t this--pt reports it will "crush" the bones in her feet)        OT Problem List: Decreased strength;Decreased activity tolerance;Pain      OT Treatment/Interventions: Self-care/ADL training;Therapeutic exercise;DME and/or AE instruction;Therapeutic activities;Balance training;Patient/family education;Energy conservation    OT Goals(Current goals can be found in the care plan section) Acute Rehab OT Goals Patient Stated Goal: to return to PLOF OT Goal Formulation: With patient Time For Goal Achievement: 11/09/19 Potential to Achieve Goals: Good  OT Frequency: Min 1X/week   Barriers to D/C:            Co-evaluation              AM-PAC OT "6 Clicks" Daily Activity     Outcome Measure Help from another person eating meals?: None Help from another person taking care of personal grooming?: A Little Help from another person toileting, which includes using toliet, bedpan, or urinal?: A Little Help from another person bathing (including washing, rinsing, drying)?: A Little Help from another person to put on and taking off regular upper body clothing?: None Help from another person to put on and taking off regular lower body clothing?: A Little 6 Click Score: 20   End of Session    Activity Tolerance: Patient tolerated treatment well Patient left: in chair;with call bell/phone within reach  OT Visit  Diagnosis: Unsteadiness on feet (R26.81);Muscle weakness (generalized) (M62.81)                Time: 3016-0109 OT Time Calculation (min): 38 min Charges:  OT General Charges $OT Visit: 1 Visit OT Evaluation $OT Eval Moderate Complexity: 1 Mod OT Treatments $Self Care/Home Management : 23-37 mins  Gerrianne Scale, MS, OTR/L ascom [phone] 10/26/19, 5:21 PM

## 2019-10-26 NOTE — Progress Notes (Signed)
PT Cancellation Note  Patient Details Name: Karla Sanchez MRN: [id number] DOB: 10-10-1940   Cancelled Treatment:    Reason Eval/Treat Not Completed: Other (comment) (Pt asleep in chair, declining PT or returning to bed at this time. PT to re-attempt as able.)  Lieutenant Diego PT, DPT 3:46 PM,10/26/19

## 2019-10-26 NOTE — Progress Notes (Signed)
Unable to accurately measure output as ordered as pt has repeatedly refused to have purewick on stating that she is able to tell when she needs to urinate and she can use the [facility]. However, during the night, pt has been incontinent both in the bed and in the chair. Offered purewick again and educated on importance of accurate urine measurement as she is on lasix but pt still declined.

## 2019-10-26 NOTE — Care Management Important Message (Signed)
Important Message  Patient Details  Name: Karla Sanchez MRN: [id number] Date of Birth: [birth year]   Medicare Important Message Given:  N/A - LOS <3 / Initial given by admissions     [name] 10/26/2019, 10:36 AM

## 2019-10-26 NOTE — Progress Notes (Signed)
PROGRESS NOTE  Karla Sanchez VXB:[id number] DOB: 12-24-1940 DOA: 10/22/2019 PCP: Alfredia Client, MD  HPI/Recap of past 24 hours: HPI from Dr Posey Pronto 79 year old female with h/o chronic diastolic CHF,history of insulin dependent type II diabetes, hypertension, and COPD on supplemental oxygen. The patient reports a recent history of recurrent progressive exertional dyspnea. She appears to be medically noncompliant staying in [place]. Patient was evaluated and was to be admitted but left AMA. She was sitting in her wheelchair on the wheel chair apparently today when she had a possible syncopal episode. She was scared so she came back to the ER. She still has anasarca, appears overloaded.     Today, patient reported some slight improvement in her breathing.  Continues to decline PT, SCDs/Lovenox.  Advised and educated patient on the need for compliance in the hospital.     Assessment/Plan: Principal Problem:   Acute exacerbation of CHF (congestive heart failure) (HCC) Active Problems:   Type 2 diabetes mellitus with stage 3 chronic kidney disease (HCC)   Obesity, diabetes, and hypertension syndrome (HCC)   COPD (chronic obstructive pulmonary disease) (HCC)   Obesity, Class III, BMI 40-49.9 (morbid obesity) ([place])   Hypertension   Chronic kidney disease, stage 3b   Non compliance with medical treatment   Acute pulmonary edema (HCC)   Acute on chronic exacerbation of diastolic HF Patient appears to be volume overloaded BNP 93.1, but may be falsely low in obese patients Chest x-ray with vascular congestion Echo done on 06/2019 showed EF of 55 to 33%, grade 2 diastolic dysfunction, moderate pulmonary hypertension 48.4 mmHg Restart IV Lasix Strict I's and O's, daily weights Monitor electrolytes closely, telemetry  Possible syncope Continue to monitor on telemetry Echo as above  Diabetes mellitus type 2, uncontrolled with hyperglycemia A1c on 09/2019 was 8 SSI,  Lantus, Accu-Cheks, hypoglycemic protocol  CKD stage IIIb Creatinine with a slight bump, likely 2/2 renal congestion, hoping to improve with IV diuresis Daily BMP  Hypertension Continue metoprolol, will hold lisinopril for now  COPD Continue inhalers, nebulizers, prednisone Supplemental oxygen as needed  Morbid obesity Lifestyle modification advised        Malnutrition Type:      Malnutrition Characteristics:      Nutrition Interventions:       Estimated body mass index is 41.63 kg/m as calculated from the following:   Height as of this encounter: 5\' 6"  (1.676 m).   Weight as of this encounter: 117 kg.     Code Status: Full  Family Communication: None at bedside  Disposition Plan: Status is: Inpatient  Remains inpatient appropriate because:Inpatient level of care appropriate due to severity of illness   Dispo: The patient is from: Home-lives in a hotel              Anticipated d/c is to: SNF              Anticipated d/c date is: 2 days              Patient currently is not medically stable to d/c.   Consultants: None  Procedures:  None  Antimicrobials:  None  DVT prophylaxis: Lovenox-patient refusing   Objective: Vitals:   10/25/19 2026 10/26/19 0638 10/26/19 1036 10/26/19 1432  BP: (!) 123/59 (!) 113/94 (!) 106/96 (!) 148/57  Pulse: 87 61  65  Resp: 16 20  19   Temp: 98 F (36.7 C) 97.7 F (36.5 C)  98.4 F (36.9 C)  TempSrc:  Oral  Oral  SpO2: 92% 100%  98%  Weight:      Height:        Intake/Output Summary (Last 24 hours) at 10/26/2019 1638 Last data filed at 10/26/2019 1300 Gross per 24 hour  Intake 1078 ml  Output 550 ml  Net 528 ml   Filed Weights   10/22/19 2145 10/23/19 0313  Weight: (!) 99.8 kg (!) 117 kg    Exam:  General: NAD   Cardiovascular: S1, S2 present  Respiratory:  Diminished breath sounds bilaterally, with bibasilar crackles  Abdomen: Soft, nontender, nondistended, bowel sounds  present  Musculoskeletal: 2+ bilateral pedal edema noted right, 1+ on the left  Skin: Normal  Psychiatry: Normal mood    Data Reviewed: CBC: Recent Labs  Lab 10/21/19 2129 10/22/19 2218 10/23/19 1044  WBC 10.4 9.6 8.9  NEUTROABS 6.0  --  5.8  HGB 10.6* 10.1* 11.7*  HCT 37.2 33.6* 40.9  MCV 73.4* 71.9* 73.6*  PLT 353 320 962   Basic Metabolic Panel: Recent Labs  Lab 10/22/19 2218 10/23/19 1044 10/24/19 0403 10/25/19 0857 10/26/19 0701  NA 139 138 140 134* 134*  K 4.7 4.8 4.9 4.7 4.9  CL 99 97* 97* 93* 94*  CO2 30 32 33* 30 29  GLUCOSE 181* 203* 154* 291* 156*  BUN 38* 36* 37* 50* 67*  CREATININE 1.21* 1.10* 1.18* 1.58* 1.66*  CALCIUM 8.1* 8.3* 8.5* 8.3* 8.7*   GFR: Estimated Creatinine Clearance: 35.7 mL/min (A) (by C-G formula based on SCr of 1.66 mg/dL (H)). Liver Function Tests: Recent Labs  Lab 10/21/19 2129 10/22/19 2218  AST 12* 13*  ALT 12 11  ALKPHOS 102 92  BILITOT 0.5 0.5  PROT 6.4* 5.8*  ALBUMIN 3.1* 2.8*   No results for input(s): LIPASE, AMYLASE in the last 168 hours. No results for input(s): AMMONIA in the last 168 hours. Coagulation Profile: No results for input(s): INR, PROTIME in the last 168 hours. Cardiac Enzymes: No results for input(s): CKTOTAL, CKMB, CKMBINDEX, TROPONINI in the last 168 hours. BNP (last 3 results) No results for input(s): PROBNP in the last 8760 hours. HbA1C: No results for input(s): HGBA1C in the last 72 hours. CBG: Recent Labs  Lab 10/25/19 1155 10/25/19 1642 10/25/19 2109 10/26/19 0733 10/26/19 1202  GLUCAP 139* 135* 387* 142* 174*   Lipid Profile: No results for input(s): CHOL, HDL, LDLCALC, TRIG, CHOLHDL, LDLDIRECT in the last 72 hours. Thyroid Function Tests: No results for input(s): TSH, T4TOTAL, FREET4, T3FREE, THYROIDAB in the last 72 hours. Anemia Panel: No results for input(s): VITAMINB12, FOLATE, FERRITIN, TIBC, IRON, RETICCTPCT in the last 72 hours. Urine analysis:    Component Value  Date/Time   COLORURINE STRAW (A) 07/30/2019 2204   APPEARANCEUR CLEAR (A) 07/30/2019 2204   LABSPEC 1.009 07/30/2019 2204   PHURINE 5.0 07/30/2019 [place] 07/30/2019 2204   HGBUR NEGATIVE 07/30/2019 2204   [place] 07/30/2019 2204   KETONESUR NEGATIVE 07/30/2019 2204   PROTEINUR 100 (A) 07/30/2019 2204   NITRITE NEGATIVE 07/30/2019 2204   LEUKOCYTESUR NEGATIVE 07/30/2019 2204   Sepsis Labs: @LABRCNTIP (procalcitonin:4,lacticidven:4)  ) Recent Results (from the past 240 hour(s))  SARS Coronavirus 2 by RT PCR (hospital order, performed in [place] hospital lab) Nasopharyngeal Nasopharyngeal Swab     Status: None   Collection Time: 10/21/19  9:29 PM   Specimen: Nasopharyngeal Swab  Result Value Ref Range Status   SARS Coronavirus 2 NEGATIVE NEGATIVE Final    Comment: (NOTE) SARS-CoV-2 target nucleic acids are  NOT DETECTED.  The SARS-CoV-2 RNA is generally detectable in upper and lower respiratory specimens during the acute phase of infection. The lowest concentration of SARS-CoV-2 viral copies this assay can detect is 250 copies / mL. A negative result does not preclude SARS-CoV-2 infection and should not be used as the sole basis for treatment or other patient management decisions.  A negative result may occur with improper specimen collection / handling, submission of specimen other than nasopharyngeal swab, presence of viral mutation(s) within the areas targeted by this assay, and inadequate number of viral copies (<250 copies / mL). A negative result must be combined with clinical observations, patient history, and epidemiological information.  Fact Sheet for Patients:   StrictlyIdeas.no  Fact Sheet for Healthcare Providers: BankingDealers.co.za  This test is not yet approved or  cleared by the [place] FDA and has been authorized for detection and/or diagnosis of SARS-CoV-2 by FDA under  an Emergency Use Authorization (EUA).  This EUA will remain in effect (meaning this test can be used) for the duration of the COVID-19 declaration under Section 564(b)(1) of the Act, 21 U.S.C. section 360bbb-3(b)(1), unless the authorization is terminated or revoked sooner.  Performed at [facility], [address] [place], [place] [address]       Studies: No results found.  Scheduled Meds: . colchicine  0.6 mg Oral Daily  . enoxaparin (LOVENOX) injection  40 mg Subcutaneous Q24H  . furosemide  40 mg Intravenous BID  . insulin aspart  0-20 Units Subcutaneous TID WC  . insulin aspart  0-5 Units Subcutaneous QHS  . insulin glargine  25 Units Subcutaneous QHS  . lisinopril  5 mg Oral Daily  . loratadine  10 mg Oral Daily  . metoprolol tartrate  50 mg Oral BID  . predniSONE  50 mg Oral Q breakfast    Continuous Infusions: . sodium chloride       LOS: 3 days     Alma Friendly, MD [facility]  If 7PM-7AM, please contact night-coverage [email or web address] 10/26/2019, 4:38 PM

## 2019-10-27 DIAGNOSIS — J810 Acute pulmonary edema: Secondary | ICD-10-CM | POA: Diagnosis not present

## 2019-10-27 DIAGNOSIS — I5033 Acute on chronic diastolic (congestive) heart failure: Secondary | ICD-10-CM | POA: Diagnosis not present

## 2019-10-27 DIAGNOSIS — J449 Chronic obstructive pulmonary disease, unspecified: Secondary | ICD-10-CM | POA: Diagnosis not present

## 2019-10-27 DIAGNOSIS — N1832 Chronic kidney disease, stage 3b: Secondary | ICD-10-CM | POA: Diagnosis not present

## 2019-10-27 LAB — CBC
HCT: 34.6 % — ABNORMAL LOW (ref 36.0–46.0)
Hemoglobin: 10.4 g/dL — ABNORMAL LOW (ref 12.0–15.0)
MCH: 21.1 pg — ABNORMAL LOW (ref 26.0–34.0)
MCHC: 30.1 g/dL (ref 30.0–36.0)
MCV: 70.2 fL — ABNORMAL LOW (ref 80.0–100.0)
Platelets: 330 10*3/uL (ref 150–400)
RBC: 4.93 MIL/uL (ref 3.87–5.11)
RDW: 19 % — ABNORMAL HIGH (ref 11.5–15.5)
WBC: 9.9 10*3/uL (ref 4.0–10.5)
nRBC: 0 % (ref 0.0–0.2)

## 2019-10-27 LAB — BASIC METABOLIC PANEL
Anion gap: 10 (ref 5–15)
BUN: 75 mg/dL — ABNORMAL HIGH (ref 8–23)
CO2: 31 mmol/L (ref 22–32)
Calcium: 8.5 mg/dL — ABNORMAL LOW (ref 8.9–10.3)
Chloride: 93 mmol/L — ABNORMAL LOW (ref 98–111)
Creatinine, Ser: 1.41 mg/dL — ABNORMAL HIGH (ref 0.44–1.00)
GFR calc Af Amer: 41 mL/min — ABNORMAL LOW (ref >60)
GFR calc non Af Amer: 35 mL/min — ABNORMAL LOW (ref >60)
Glucose, Bld: 125 mg/dL — ABNORMAL HIGH (ref 70–99)
Potassium: 5.2 mmol/L — ABNORMAL HIGH (ref 3.5–5.1)
Sodium: 134 mmol/L — ABNORMAL LOW (ref 135–145)

## 2019-10-27 LAB — GLUCOSE, CAPILLARY
Glucose-Capillary: 119 mg/dL — ABNORMAL HIGH (ref 70–99)
Glucose-Capillary: 103 mg/dL — ABNORMAL HIGH (ref 70–99)
Glucose-Capillary: 156 mg/dL — ABNORMAL HIGH (ref 70–99)
Glucose-Capillary: 211 mg/dL — ABNORMAL HIGH (ref 70–99)
Glucose-Capillary: 193 mg/dL — ABNORMAL HIGH (ref 70–99)

## 2019-10-27 MED ORDER — RIVAROXABAN 10 MG PO TABS
10.0000 mg | ORAL_TABLET | Freq: Every day | ORAL | Status: DC
  Filled 2019-10-27 (×6): qty 1

## 2019-10-27 NOTE — Care Management Important Message (Signed)
Important Message  Patient Details  Name: Karla Sanchez MRN: [id number] Date of Birth: [birth year]   Medicare Important Message Given:  Yes     Juliann Pulse A Karem Farha 10/27/2019, 3:32 PM

## 2019-10-27 NOTE — Progress Notes (Addendum)
OT Cancellation Note  Patient Details Name: Karla Sanchez MRN: [id number] DOB: 24-Aug-1940   Cancelled Treatment:    Reason Eval/Treat Not Completed: Patient declined, no reason specified  Pt declines to participate in any aspect of OT treatment at this time including ADLs/self care, stretching, ROM, strengthening despite encouragement. Pt states "I ain't able today, don't waste your time on me." Will f/u for OT treatment at later date/time as able/as pt becomes agreeable.   Gerrianne Scale, [place], OTR/L ascom [phone] 10/27/19, 10:45 AM

## 2019-10-27 NOTE — Progress Notes (Signed)
PT Cancellation Note  Patient Details Name: Karla Sanchez MRN: [id number] DOB: 16-Mar-1941   Cancelled Treatment:    Reason Eval/Treat Not Completed: Other (comment) (Pt up in chair, politely declining PT at this time. PT to re-attempt as able.)  Lieutenant Diego PT, DPT 2:50 PM,10/27/19

## 2019-10-27 NOTE — Progress Notes (Signed)
PROGRESS NOTE  Karla Sanchez OVF:[id number] DOB: 1941/03/22 DOA: 10/22/2019 PCP: Alfredia Client, MD  HPI/Recap of past 24 hours: HPI from Dr Posey Pronto 79 year old female with h/o chronic diastolic CHF,history of insulin dependent type II diabetes, hypertension, and COPD on supplemental oxygen. The patient reports a recent history of recurrent progressive exertional dyspnea. She appears to be medically noncompliant staying in [place]. Patient was evaluated and was to be admitted but left AMA. She was sitting in her wheelchair on the wheel chair apparently today when she had a possible syncopal episode. She was scared so she came back to the ER. She still has anasarca, appears overloaded.     Today, patient reports breathing is improving, but continues to hurt all over.  Patient states that she is wheelchair-bound and unable to bear weight upon standing due to significant gout in her BLE.  Continues to refuse PT/OT, Lovenox/SCD    Assessment/Plan: Principal Problem:   Acute exacerbation of CHF (congestive heart failure) (HCC) Active Problems:   Type 2 diabetes mellitus with stage 3 chronic kidney disease (HCC)   Obesity, diabetes, and hypertension syndrome (HCC)   COPD (chronic obstructive pulmonary disease) (HCC)   Obesity, Class III, BMI 40-49.9 (morbid obesity) ([place])   Hypertension   Chronic kidney disease, stage 3b   Non compliance with medical treatment   Acute pulmonary edema (HCC)   Acute on chronic exacerbation of diastolic HF Patient appears to be volume overloaded on admission, currently improving BNP 93.1, but may be falsely low in obese patients Chest x-ray with vascular congestion Echo done on 06/2019 showed EF of 55 to 84%, grade 2 diastolic dysfunction, moderate pulmonary hypertension 48.4 mmHg Continue IV Lasix Strict I's and O's, daily weights Monitor electrolytes closely, telemetry  Possible syncope Continue to monitor on telemetry Echo as  above  Diabetes mellitus type 2, uncontrolled with hyperglycemia A1c on 09/2019 was 8 SSI, Lantus, Accu-Cheks, hypoglycemic protocol  CKD stage IIIb Creatinine with a slight bump, likely 2/2 renal congestion, hoping to improve with IV diuresis Daily BMP  Hypertension Continue metoprolol, will hold lisinopril for now  COPD Continue inhalers, nebulizers, prednisone Supplemental oxygen as needed  Morbid obesity Lifestyle modification advised        Malnutrition Type:      Malnutrition Characteristics:      Nutrition Interventions:       Estimated body mass index is 41.63 kg/m as calculated from the following:   Height as of this encounter: 5\' 6"  (1.676 m).   Weight as of this encounter: 117 kg.     Code Status: Full  Family Communication: None at bedside  Disposition Plan: Status is: Inpatient  Remains inpatient appropriate because:Inpatient level of care appropriate due to severity of illness   Dispo: The patient is from: Home-lives in a hotel              Anticipated d/c is to: SNF              Anticipated d/c date is: 2 days              Patient currently is not medically stable to d/c.   Consultants: None  Procedures:  None  Antimicrobials:  None  DVT prophylaxis: Xarelto for DVT PPx Patient refusing Lovenox/SCD   Objective: Vitals:   10/26/19 1935 10/26/19 1940 10/27/19 0600 10/27/19 1346  BP: 120/69  (!) 153/54 (!) 108/36  Pulse: 70  60 (!) 57  Resp: 18  20 18   Temp: 97.8  F (36.6 C)  (!) 97.5 F (36.4 C) (!) 97.5 F (36.4 C)  TempSrc: Oral  Oral Oral  SpO2: (!) 84% 93% 95% 93%  Weight:      Height:        Intake/Output Summary (Last 24 hours) at 10/27/2019 1413 Last data filed at 10/27/2019 0043 Gross per 24 hour  Intake --  Output 400 ml  Net -400 ml   Filed Weights   10/22/19 2145 10/23/19 0313  Weight: (!) 99.8 kg (!) 117 kg    Exam:  General: NAD   Cardiovascular: S1, S2 present  Respiratory:  Diminished  breath sounds bilaterally  Abdomen: Soft, nontender, nondistended, bowel sounds present  Musculoskeletal: 2+ bilateral pedal edema noted right, 1+ on the left  Skin: Normal  Psychiatry: Normal mood    Data Reviewed: CBC: Recent Labs  Lab 10/21/19 2129 10/22/19 2218 10/23/19 1044 10/27/19 1251  WBC 10.4 9.6 8.9 9.9  NEUTROABS 6.0  --  5.8  --   HGB 10.6* 10.1* 11.7* 10.4*  HCT 37.2 33.6* 40.9 34.6*  MCV 73.4* 71.9* 73.6* 70.2*  PLT 353 320 340 245   Basic Metabolic Panel: Recent Labs  Lab 10/23/19 1044 10/24/19 0403 10/25/19 0857 10/26/19 0701 10/27/19 0609  NA 138 140 134* 134* 134*  K 4.8 4.9 4.7 4.9 5.2*  CL 97* 97* 93* 94* 93*  CO2 32 33* 30 29 31   GLUCOSE 203* 154* 291* 156* 125*  BUN 36* 37* 50* 67* 75*  CREATININE 1.10* 1.18* 1.58* 1.66* 1.41*  CALCIUM 8.3* 8.5* 8.3* 8.7* 8.5*   GFR: Estimated Creatinine Clearance: 42.1 mL/min (A) (by C-G formula based on SCr of 1.41 mg/dL (H)). Liver Function Tests: Recent Labs  Lab 10/21/19 2129 10/22/19 2218  AST 12* 13*  ALT 12 11  ALKPHOS 102 92  BILITOT 0.5 0.5  PROT 6.4* 5.8*  ALBUMIN 3.1* 2.8*   No results for input(s): LIPASE, AMYLASE in the last 168 hours. No results for input(s): AMMONIA in the last 168 hours. Coagulation Profile: No results for input(s): INR, PROTIME in the last 168 hours. Cardiac Enzymes: No results for input(s): CKTOTAL, CKMB, CKMBINDEX, TROPONINI in the last 168 hours. BNP (last 3 results) No results for input(s): PROBNP in the last 8760 hours. HbA1C: No results for input(s): HGBA1C in the last 72 hours. CBG: Recent Labs  Lab 10/26/19 2141 10/26/19 2322 10/27/19 0550 10/27/19 0802 10/27/19 1136  GLUCAP 210* 188* 119* 103* 156*   Lipid Profile: No results for input(s): CHOL, HDL, LDLCALC, TRIG, CHOLHDL, LDLDIRECT in the last 72 hours. Thyroid Function Tests: No results for input(s): TSH, T4TOTAL, FREET4, T3FREE, THYROIDAB in the last 72 hours. Anemia Panel: No  results for input(s): VITAMINB12, FOLATE, FERRITIN, TIBC, IRON, RETICCTPCT in the last 72 hours. Urine analysis:    Component Value Date/Time   COLORURINE STRAW (A) 07/30/2019 2204   APPEARANCEUR CLEAR (A) 07/30/2019 2204   LABSPEC 1.009 07/30/2019 2204   PHURINE 5.0 07/30/2019 [place] 07/30/2019 2204   HGBUR NEGATIVE 07/30/2019 2204   [place] 07/30/2019 2204   KETONESUR NEGATIVE 07/30/2019 2204   PROTEINUR 100 (A) 07/30/2019 2204   NITRITE NEGATIVE 07/30/2019 2204   LEUKOCYTESUR NEGATIVE 07/30/2019 2204   Sepsis Labs: @LABRCNTIP (procalcitonin:4,lacticidven:4)  ) Recent Results (from the past 240 hour(s))  SARS Coronavirus 2 by RT PCR (hospital order, performed in [place] hospital lab) Nasopharyngeal Nasopharyngeal Swab     Status: None   Collection Time: 10/21/19  9:29 PM  Specimen: Nasopharyngeal Swab  Result Value Ref Range Status   SARS Coronavirus 2 NEGATIVE NEGATIVE Final    Comment: (NOTE) SARS-CoV-2 target nucleic acids are NOT DETECTED.  The SARS-CoV-2 RNA is generally detectable in upper and lower respiratory specimens during the acute phase of infection. The lowest concentration of SARS-CoV-2 viral copies this assay can detect is 250 copies / mL. A negative result does not preclude SARS-CoV-2 infection and should not be used as the sole basis for treatment or other patient management decisions.  A negative result may occur with improper specimen collection / handling, submission of specimen other than nasopharyngeal swab, presence of viral mutation(s) within the areas targeted by this assay, and inadequate number of viral copies (<250 copies / mL). A negative result must be combined with clinical observations, patient history, and epidemiological information.  Fact Sheet for Patients:   StrictlyIdeas.no  Fact Sheet for Healthcare Providers: BankingDealers.co.za  This test is not  yet approved or  cleared by the [place] FDA and has been authorized for detection and/or diagnosis of SARS-CoV-2 by FDA under an Emergency Use Authorization (EUA).  This EUA will remain in effect (meaning this test can be used) for the duration of the COVID-19 declaration under Section 564(b)(1) of the Act, 21 U.S.C. section 360bbb-3(b)(1), unless the authorization is terminated or revoked sooner.  Performed at [facility], [address], [place], [place] [address]       Studies: No results found.  Scheduled Meds: . colchicine  0.6 mg Oral Daily  . enoxaparin (LOVENOX) injection  40 mg Subcutaneous Q24H  . furosemide  40 mg Intravenous BID  . guaiFENesin  600 mg Oral BID  . insulin aspart  0-20 Units Subcutaneous TID WC  . insulin aspart  0-5 Units Subcutaneous QHS  . insulin glargine  25 Units Subcutaneous QHS  . loratadine  10 mg Oral Daily  . metoprolol tartrate  50 mg Oral BID  . predniSONE  50 mg Oral Q breakfast    Continuous Infusions: . sodium chloride       LOS: 4 days     Alma Friendly, MD [facility]  If 7PM-7AM, please contact night-coverage [email or web address] 10/27/2019, 2:13 PM

## 2019-10-27 NOTE — Care Management Important Message (Signed)
Important Message  Patient Details  Name: Karla Sanchez MRN: [id number] Date of Birth: 1940/05/01   Medicare Important Message Given:  Yes     Juliann Pulse A Hendrik Donath 10/27/2019, 3:33 PM

## 2019-10-27 NOTE — Progress Notes (Signed)
ANTICOAGULATION CONSULT NOTE - Initial Consult  Pharmacy Consult for Xarelto Indication: VTE prophylaxis  (pt refusing Lovenox and SCDs)  Allergies  Allergen Reactions   Codeine Nausea Only, Other (See Comments) and Swelling    Other Reaction: nausea and vomiting Other reaction(s): NAUSEA    Other Other (See Comments)    Other reaction(s): SWELLING  Other reaction(s): MUSCLE PAIN Muscle aches Other reaction(s): MUSCLE PAIN Muscle aches Other reaction(s): MUSCLE PAIN Muscle aches    Exenatide Nausea And Vomiting   Levofloxacin Other (See Comments)    Other Reaction: sloughing of buccal mucosa Other reaction(s): OTHER Lost skin in mouth Patient states she can take Cipro without difficulty and has taken it many times in the past Other Reaction: sloughing of buccal mucosa    Losartan Other (See Comments)    Weight gain Weight gain    Tetracyclines & Related Other (See Comments)    Patient Measurements: Height: 5\' 6"  (167.6 cm) Weight: (!) 117 kg (257 lb 15 oz) IBW/kg (Calculated) : 59.3 Heparin Dosing Weight:    Vital Signs: Temp: 97.5 F (36.4 C) (08/06 1346) Temp Source: Oral (08/06 1346) BP: 120/45 (08/06 1438) Pulse Rate: 54 (08/06 1438)  Labs: Recent Labs    10/25/19 0857 10/26/19 0701 10/27/19 0609 10/27/19 1251  HGB  --   --   --  10.4*  HCT  --   --   --  34.6*  PLT  --   --   --  330  CREATININE 1.58* 1.66* 1.41*  --     Estimated Creatinine Clearance: 42.1 mL/min (A) (by C-G formula based on SCr of 1.41 mg/dL (H)).   Medical History: Past Medical History:  Diagnosis Date   CHF (congestive heart failure) (HCC)    Colon cancer (HCC)    s/p right and left colectomy 2020   COPD (chronic obstructive pulmonary disease) (HCC)    Diabetes mellitus without complication (HCC)    Diastolic heart failure (HCC)    Gout    Hypertension     Medications:  Scheduled:   colchicine  0.6 mg Oral Daily   guaiFENesin  600 mg Oral BID    insulin aspart  0-20 Units Subcutaneous TID WC   insulin aspart  0-5 Units Subcutaneous QHS   insulin glargine  25 Units Subcutaneous QHS   loratadine  10 mg Oral Daily   predniSONE  50 mg Oral Q breakfast   rivaroxaban  10 mg Oral Daily   Infusions:   sodium chloride      Assessment: 79 yo F refusing Lovenox and SCDs for VTE prophylaxis while inpatient. Patient is wheelchair-bound. hgb 10.4  Plt 330  Goal of Therapy:  Monitor platelets by anticoagulation protocol: Yes   Plan:  Will begin Xarelto 10 mg  for VTE prophylaxis while inpatient. F/u CBC.   Rhyder Koegel A 10/27/2019,2:59 PM

## 2019-10-28 DIAGNOSIS — N1832 Chronic kidney disease, stage 3b: Secondary | ICD-10-CM | POA: Diagnosis not present

## 2019-10-28 DIAGNOSIS — J449 Chronic obstructive pulmonary disease, unspecified: Secondary | ICD-10-CM | POA: Diagnosis not present

## 2019-10-28 DIAGNOSIS — I5033 Acute on chronic diastolic (congestive) heart failure: Secondary | ICD-10-CM | POA: Diagnosis not present

## 2019-10-28 DIAGNOSIS — J810 Acute pulmonary edema: Secondary | ICD-10-CM | POA: Diagnosis not present

## 2019-10-28 LAB — CBC
HCT: 33.9 % — ABNORMAL LOW (ref 36.0–46.0)
Hemoglobin: 10.2 g/dL — ABNORMAL LOW (ref 12.0–15.0)
MCH: 21.3 pg — ABNORMAL LOW (ref 26.0–34.0)
MCHC: 30.1 g/dL (ref 30.0–36.0)
MCV: 70.9 fL — ABNORMAL LOW (ref 80.0–100.0)
Platelets: 313 10*3/uL (ref 150–400)
RBC: 4.78 MIL/uL (ref 3.87–5.11)
RDW: 19 % — ABNORMAL HIGH (ref 11.5–15.5)
WBC: 11 10*3/uL — ABNORMAL HIGH (ref 4.0–10.5)
nRBC: 0 % (ref 0.0–0.2)

## 2019-10-28 LAB — BASIC METABOLIC PANEL
Anion gap: 12 (ref 5–15)
BUN: 77 mg/dL — ABNORMAL HIGH (ref 8–23)
CO2: 29 mmol/L (ref 22–32)
Calcium: 8.3 mg/dL — ABNORMAL LOW (ref 8.9–10.3)
Chloride: 95 mmol/L — ABNORMAL LOW (ref 98–111)
Creatinine, Ser: 1.25 mg/dL — ABNORMAL HIGH (ref 0.44–1.00)
GFR calc Af Amer: 47 mL/min — ABNORMAL LOW (ref >60)
GFR calc non Af Amer: 41 mL/min — ABNORMAL LOW (ref >60)
Glucose, Bld: 166 mg/dL — ABNORMAL HIGH (ref 70–99)
Potassium: 4.6 mmol/L (ref 3.5–5.1)
Sodium: 136 mmol/L (ref 135–145)

## 2019-10-28 LAB — GLUCOSE, CAPILLARY
Glucose-Capillary: 278 mg/dL — ABNORMAL HIGH (ref 70–99)
Glucose-Capillary: 249 mg/dL — ABNORMAL HIGH (ref 70–99)

## 2019-10-28 MED ORDER — FUROSEMIDE 10 MG/ML IJ SOLN
40.0000 mg | Freq: Two times a day (BID) | INTRAMUSCULAR | Status: DC
  Administered 2019-10-28 (×2): 40 mg via INTRAVENOUS
  Filled 2019-10-28 (×3): qty 4

## 2019-10-28 NOTE — Plan of Care (Signed)
Patient refused anticoagulant. Patient refused external catheter use stating "I know when I pee" however patient found to be wet multiple times during the day.  Patient encouraged to allow assistance for hygiene however she refuses most of the time.

## 2019-10-28 NOTE — Progress Notes (Signed)
PROGRESS NOTE  Karla Sanchez GMW:[id number] DOB: Nov 25, 1940 DOA: 10/22/2019 PCP: Alfredia Client, MD  HPI/Recap of past 24 hours: HPI from Dr Posey Pronto 79 year old female with h/o chronic diastolic CHF,history of insulin dependent type II diabetes, hypertension, and COPD on supplemental oxygen. The patient reports a recent history of recurrent progressive exertional dyspnea. She appears to be medically noncompliant staying in [place]. Patient was evaluated and was to be admitted but left AMA. She was sitting in her wheelchair on the wheel chair apparently today when she had a possible syncopal episode. She was scared so she came back to the ER. She still has anasarca, appears overloaded.     Today, patient denied any new complaints.  Noted room very cold, she knows to report feeling cold.     Assessment/Plan: Principal Problem:   Acute exacerbation of CHF (congestive heart failure) (HCC) Active Problems:   Type 2 diabetes mellitus with stage 3 chronic kidney disease (HCC)   Obesity, diabetes, and hypertension syndrome (HCC)   COPD (chronic obstructive pulmonary disease) (HCC)   Obesity, Class III, BMI 40-49.9 (morbid obesity) ([place])   Hypertension   Chronic kidney disease, stage 3b   Non compliance with medical treatment   Acute pulmonary edema (HCC)   Acute on chronic exacerbation of diastolic HF Patient appears to be volume overloaded on admission, currently improving BNP 93.1, but may be falsely low in obese patients Chest x-ray with vascular congestion Echo done on 06/2019 showed EF of 55 to 44%, grade 2 diastolic dysfunction, moderate pulmonary hypertension 48.4 mmHg Continue IV Lasix Strict I's and O's, daily weights Monitor electrolytes closely, telemetry  Possible syncope Continue to monitor on telemetry Echo as above  Diabetes mellitus type 2, uncontrolled with hyperglycemia A1c on 09/2019 was 8 SSI, Lantus, Accu-Cheks, hypoglycemic  protocol  CKD stage IIIb Creatinine stable Daily BMP  Hypertension BP soft Hold metoprolol, lisinopril  COPD Continue inhalers, nebulizers, prednisone Supplemental oxygen as needed  Morbid obesity Lifestyle modification advised        Malnutrition Type:      Malnutrition Characteristics:      Nutrition Interventions:       Estimated body mass index is 41.63 kg/m as calculated from the following:   Height as of this encounter: 5\' 6"  (1.676 m).   Weight as of this encounter: 117 kg.     Code Status: Full  Family Communication: None at bedside  Disposition Plan: Status is: Inpatient  Remains inpatient appropriate because:Inpatient level of care appropriate due to severity of illness   Dispo: The patient is from: Home-lives in a hotel              Anticipated d/c is to: SNF              Anticipated d/c date is: 2 days              Patient currently is not medically stable to d/c.   Consultants: None  Procedures:  None  Antimicrobials:  None  DVT prophylaxis: Xarelto for DVT PPx Patient refusing Lovenox/SCD   Objective: Vitals:   10/27/19 1652 10/27/19 2024 10/28/19 0321 10/28/19 1207  BP: (!) 144/62 (!) 162/62 (!) 146/46 (!) 126/58  Pulse:  73 61 (!) 53  Resp:  18 18 16   Temp:  97.7 F (36.5 C) 97.7 F (36.5 C) (!) 97.5 F (36.4 C)  TempSrc:  Oral Oral   SpO2:  96% 95% 97%  Weight:      Height:  Intake/Output Summary (Last 24 hours) at 10/28/2019 1520 Last data filed at 10/28/2019 1320 Gross per 24 hour  Intake 240 ml  Output --  Net 240 ml   Filed Weights   10/22/19 2145 10/23/19 0313  Weight: (!) 99.8 kg (!) 117 kg    Exam:  General: NAD   Cardiovascular: S1, S2 present  Respiratory:  Diminished breath sounds bilaterally  Abdomen: Soft, nontender, nondistended, bowel sounds present  Musculoskeletal: 1+ bilateral pedal edema noted right, trace on the left  Skin: Normal  Psychiatry: Normal  mood    Data Reviewed: CBC: Recent Labs  Lab 10/21/19 2129 10/22/19 2218 10/23/19 1044 10/27/19 1251 10/28/19 1116  WBC 10.4 9.6 8.9 9.9 11.0*  NEUTROABS 6.0  --  5.8  --   --   HGB 10.6* 10.1* 11.7* 10.4* 10.2*  HCT 37.2 33.6* 40.9 34.6* 33.9*  MCV 73.4* 71.9* 73.6* 70.2* 70.9*  PLT 353 320 340 330 481   Basic Metabolic Panel: Recent Labs  Lab 10/24/19 0403 10/25/19 0857 10/26/19 0701 10/27/19 0609 10/28/19 1116  NA 140 134* 134* 134* 136  K 4.9 4.7 4.9 5.2* 4.6  CL 97* 93* 94* 93* 95*  CO2 33* 30 29 31 29   GLUCOSE 154* 291* 156* 125* 166*  BUN 37* 50* 67* 75* 77*  CREATININE 1.18* 1.58* 1.66* 1.41* 1.25*  CALCIUM 8.5* 8.3* 8.7* 8.5* 8.3*   GFR: Estimated Creatinine Clearance: 47.5 mL/min (A) (by C-G formula based on SCr of 1.25 mg/dL (H)). Liver Function Tests: Recent Labs  Lab 10/21/19 2129 10/22/19 2218  AST 12* 13*  ALT 12 11  ALKPHOS 102 92  BILITOT 0.5 0.5  PROT 6.4* 5.8*  ALBUMIN 3.1* 2.8*   No results for input(s): LIPASE, AMYLASE in the last 168 hours. No results for input(s): AMMONIA in the last 168 hours. Coagulation Profile: No results for input(s): INR, PROTIME in the last 168 hours. Cardiac Enzymes: No results for input(s): CKTOTAL, CKMB, CKMBINDEX, TROPONINI in the last 168 hours. BNP (last 3 results) No results for input(s): PROBNP in the last 8760 hours. HbA1C: No results for input(s): HGBA1C in the last 72 hours. CBG: Recent Labs  Lab 10/27/19 0550 10/27/19 0802 10/27/19 1136 10/27/19 1654 10/27/19 2142  GLUCAP 119* 103* 156* 211* 193*   Lipid Profile: No results for input(s): CHOL, HDL, LDLCALC, TRIG, CHOLHDL, LDLDIRECT in the last 72 hours. Thyroid Function Tests: No results for input(s): TSH, T4TOTAL, FREET4, T3FREE, THYROIDAB in the last 72 hours. Anemia Panel: No results for input(s): VITAMINB12, FOLATE, FERRITIN, TIBC, IRON, RETICCTPCT in the last 72 hours. Urine analysis:    Component Value Date/Time    COLORURINE STRAW (A) 07/30/2019 2204   APPEARANCEUR CLEAR (A) 07/30/2019 2204   LABSPEC 1.009 07/30/2019 2204   PHURINE 5.0 07/30/2019 [place] 07/30/2019 2204   HGBUR NEGATIVE 07/30/2019 2204   [facility] 07/30/2019 2204   KETONESUR NEGATIVE 07/30/2019 2204   PROTEINUR 100 (A) 07/30/2019 2204   NITRITE NEGATIVE 07/30/2019 2204   LEUKOCYTESUR NEGATIVE 07/30/2019 2204   Sepsis Labs: @LABRCNTIP (procalcitonin:4,lacticidven:4)  ) Recent Results (from the past 240 hour(s))  SARS Coronavirus 2 by RT PCR (hospital order, performed in [facility] hospital lab) Nasopharyngeal Nasopharyngeal Swab     Status: None   Collection Time: 10/21/19  9:29 PM   Specimen: Nasopharyngeal Swab  Result Value Ref Range Status   SARS Coronavirus 2 NEGATIVE NEGATIVE Final    Comment: (NOTE) SARS-CoV-2 target nucleic acids are NOT DETECTED.  The  SARS-CoV-2 RNA is generally detectable in upper and lower respiratory specimens during the acute phase of infection. The lowest concentration of SARS-CoV-2 viral copies this assay can detect is 250 copies / mL. A negative result does not preclude SARS-CoV-2 infection and should not be used as the sole basis for treatment or other patient management decisions.  A negative result may occur with improper specimen collection / handling, submission of specimen other than nasopharyngeal swab, presence of viral mutation(s) within the areas targeted by this assay, and inadequate number of viral copies (<250 copies / mL). A negative result must be combined with clinical observations, patient history, and epidemiological information.  Fact Sheet for Patients:   StrictlyIdeas.no  Fact Sheet for Healthcare Providers: BankingDealers.co.za  This test is not yet approved or  cleared by the [place] FDA and has been authorized for detection and/or diagnosis of SARS-CoV-2 by FDA under an Emergency  Use Authorization (EUA).  This EUA will remain in effect (meaning this test can be used) for the duration of the COVID-19 declaration under Section 564(b)(1) of the Act, 21 U.S.C. section 360bbb-3(b)(1), unless the authorization is terminated or revoked sooner.  Performed at [facility], [address] [place], [place] [address]       Studies: No results found.  Scheduled Meds: . colchicine  0.6 mg Oral Daily  . furosemide  40 mg Intravenous BID  . guaiFENesin  600 mg Oral BID  . insulin aspart  0-20 Units Subcutaneous TID WC  . insulin aspart  0-5 Units Subcutaneous QHS  . insulin glargine  25 Units Subcutaneous QHS  . loratadine  10 mg Oral Daily  . predniSONE  50 mg Oral Q breakfast  . rivaroxaban  10 mg Oral Daily    Continuous Infusions: . sodium chloride       LOS: 5 days     Alma Friendly, MD [facility]  If 7PM-7AM, please contact night-coverage [email or web address] 10/28/2019, 3:20 PM

## 2019-10-29 DIAGNOSIS — J449 Chronic obstructive pulmonary disease, unspecified: Secondary | ICD-10-CM | POA: Diagnosis not present

## 2019-10-29 DIAGNOSIS — I5033 Acute on chronic diastolic (congestive) heart failure: Secondary | ICD-10-CM | POA: Diagnosis not present

## 2019-10-29 DIAGNOSIS — J810 Acute pulmonary edema: Secondary | ICD-10-CM | POA: Diagnosis not present

## 2019-10-29 DIAGNOSIS — N1832 Chronic kidney disease, stage 3b: Secondary | ICD-10-CM | POA: Diagnosis not present

## 2019-10-29 LAB — GLUCOSE, CAPILLARY
Glucose-Capillary: 140 mg/dL — ABNORMAL HIGH (ref 70–99)
Glucose-Capillary: 109 mg/dL — ABNORMAL HIGH (ref 70–99)
Glucose-Capillary: 324 mg/dL — ABNORMAL HIGH (ref 70–99)
Glucose-Capillary: 213 mg/dL — ABNORMAL HIGH (ref 70–99)

## 2019-10-29 MED ORDER — FUROSEMIDE 40 MG PO TABS
40.0000 mg | ORAL_TABLET | Freq: Two times a day (BID) | ORAL | Status: DC
  Administered 2019-10-29 – 2019-10-30 (×2): 40 mg via ORAL
  Filled 2019-10-29 (×3): qty 1

## 2019-10-29 NOTE — Progress Notes (Signed)
Patient was supposed to get lasix IV this am. Her IV had come out during the night and she refused to let me put in a new one. She said the MD can order the lasix by mouth. Dr Horris Latino notified

## 2019-10-29 NOTE — Progress Notes (Signed)
Order received from Dr Horris Latino to discontinue telemetry and cont pulse ox

## 2019-10-29 NOTE — Progress Notes (Signed)
PROGRESS NOTE  Karla Sanchez KYH:[id number] DOB: [birth year] DOA: 10/22/2019 PCP: Alfredia Client, MD  HPI/Recap of past 24 hours: HPI from Dr Posey Pronto 79 year old female with h/o chronic diastolic CHF,history of insulin dependent type II diabetes, hypertension, and COPD on supplemental oxygen. The patient reports a recent history of recurrent progressive exertional dyspnea. She appears to be medically noncompliant staying in [place]. Patient was evaluated and was to be admitted but left AMA. She was sitting in her wheelchair on the wheel chair apparently today when she had a possible syncopal episode. She was scared so she came back to the ER. She still has anasarca, appears overloaded.     Patient continues to decline medication, PT/OT, lab draw, SNF placement, refuses for bed linens to be changed even after being soaked with urine.  Still needs IV diuresis, but will have to switch to p.o. form and plan for discharge on 10/30/2019.     Assessment/Plan: Principal Problem:   Acute exacerbation of CHF (congestive heart failure) (HCC) Active Problems:   Type 2 diabetes mellitus with stage 3 chronic kidney disease (HCC)   Obesity, diabetes, and hypertension syndrome (HCC)   COPD (chronic obstructive pulmonary disease) (HCC)   Obesity, Class III, BMI 40-49.9 (morbid obesity) ([place])   Hypertension   Chronic kidney disease, stage 3b   Non compliance with medical treatment   Acute pulmonary edema (HCC)   Acute on chronic exacerbation of diastolic HF Patient appears to be volume overloaded on admission, currently improving BNP 93.1, but may be falsely low in obese patients Chest x-ray with vascular congestion Echo done on 06/2019 showed EF of 55 to 15%, grade 2 diastolic dysfunction, moderate pulmonary hypertension 48.4 mmHg Continue Lasix Strict I's and O's, daily weights- not done as patient continues to refuse Monitor electrolytes closely, telemetry  Possible  syncope Continue to monitor on telemetry Echo as above  Diabetes mellitus type 2, uncontrolled with hyperglycemia A1c on 09/2019 was 8 SSI, Lantus, Accu-Cheks, hypoglycemic protocol  CKD stage IIIb Creatinine stable Daily BMP  Hypertension BP soft Hold metoprolol, lisinopril  COPD Continue inhalers, nebulizers, prednisone Supplemental oxygen as needed  Morbid obesity Lifestyle modification advised        Malnutrition Type:      Malnutrition Characteristics:      Nutrition Interventions:       Estimated body mass index is 41.63 kg/m as calculated from the following:   Height as of this encounter: 5\' 6"  (1.676 m).   Weight as of this encounter: 117 kg.     Code Status: Full  Family Communication: None at bedside  Disposition Plan: Status is: Inpatient  Remains inpatient appropriate because:Inpatient level of care appropriate due to severity of illness   Dispo: The patient is from: Home-lives in a hotel              Anticipated d/c is to: SNF-refusing              Anticipated d/c date is: 1 day              Patient currently is not medically stable to d/c.   Consultants: None  Procedures:  None  Antimicrobials:  None  DVT prophylaxis: Xarelto for DVT PPx Patient refusing Lovenox/SCD   Objective: Vitals:   10/28/19 0321 10/28/19 1207 10/28/19 1921 10/29/19 1206  BP: (!) 146/46 (!) 126/58 (!) 122/54 (!) 161/62  Pulse: 61 (!) 53 88 68  Resp: 18 16 18 18   Temp: 97.7 F (36.5 C) (!)  97.5 F (36.4 C) 98.3 F (36.8 C) 97.8 F (36.6 C)  TempSrc: Oral  Oral Oral  SpO2: 95% 97% 95% 93%  Weight:      Height:        Intake/Output Summary (Last 24 hours) at 10/29/2019 1424 Last data filed at 10/29/2019 0100 Gross per 24 hour  Intake 720 ml  Output --  Net 720 ml   Filed Weights   10/22/19 2145 10/23/19 0313  Weight: (!) 99.8 kg (!) 117 kg    Exam:  General: NAD   Cardiovascular: S1, S2 present  Respiratory:  Diminished  breath sounds bilaterally  Abdomen: Soft, nontender, nondistended, bowel sounds present  Musculoskeletal: 1+ bilateral pedal edema noted right, trace on the left  Skin: Normal  Psychiatry: Normal mood    Data Reviewed: CBC: Recent Labs  Lab 10/22/19 2218 10/23/19 1044 10/27/19 1251 10/28/19 1116  WBC 9.6 8.9 9.9 11.0*  NEUTROABS  --  5.8  --   --   HGB 10.1* 11.7* 10.4* 10.2*  HCT 33.6* 40.9 34.6* 33.9*  MCV 71.9* 73.6* 70.2* 70.9*  PLT 320 340 330 025   Basic Metabolic Panel: Recent Labs  Lab 10/24/19 0403 10/25/19 0857 10/26/19 0701 10/27/19 0609 10/28/19 1116  NA 140 134* 134* 134* 136  K 4.9 4.7 4.9 5.2* 4.6  CL 97* 93* 94* 93* 95*  CO2 33* 30 29 31 29   GLUCOSE 154* 291* 156* 125* 166*  BUN 37* 50* 67* 75* 77*  CREATININE 1.18* 1.58* 1.66* 1.41* 1.25*  CALCIUM 8.5* 8.3* 8.7* 8.5* 8.3*   GFR: Estimated Creatinine Clearance: 47.5 mL/min (A) (by C-G formula based on SCr of 1.25 mg/dL (H)). Liver Function Tests: Recent Labs  Lab 10/22/19 2218  AST 13*  ALT 11  ALKPHOS 92  BILITOT 0.5  PROT 5.8*  ALBUMIN 2.8*   No results for input(s): LIPASE, AMYLASE in the last 168 hours. No results for input(s): AMMONIA in the last 168 hours. Coagulation Profile: No results for input(s): INR, PROTIME in the last 168 hours. Cardiac Enzymes: No results for input(s): CKTOTAL, CKMB, CKMBINDEX, TROPONINI in the last 168 hours. BNP (last 3 results) No results for input(s): PROBNP in the last 8760 hours. HbA1C: No results for input(s): HGBA1C in the last 72 hours. CBG: Recent Labs  Lab 10/27/19 2142 10/28/19 1649 10/28/19 2138 10/29/19 0853 10/29/19 1158  GLUCAP 193* 278* 249* 140* 109*   Lipid Profile: No results for input(s): CHOL, HDL, LDLCALC, TRIG, CHOLHDL, LDLDIRECT in the last 72 hours. Thyroid Function Tests: No results for input(s): TSH, T4TOTAL, FREET4, T3FREE, THYROIDAB in the last 72 hours. Anemia Panel: No results for input(s): VITAMINB12,  FOLATE, FERRITIN, TIBC, IRON, RETICCTPCT in the last 72 hours. Urine analysis:    Component Value Date/Time   COLORURINE STRAW (A) 07/30/2019 2204   APPEARANCEUR CLEAR (A) 07/30/2019 2204   LABSPEC 1.009 07/30/2019 2204   PHURINE 5.0 07/30/2019 [place] 07/30/2019 2204   HGBUR NEGATIVE 07/30/2019 2204   [place] 07/30/2019 2204   KETONESUR NEGATIVE 07/30/2019 2204   PROTEINUR 100 (A) 07/30/2019 2204   NITRITE NEGATIVE 07/30/2019 2204   LEUKOCYTESUR NEGATIVE 07/30/2019 2204   Sepsis Labs: @LABRCNTIP (procalcitonin:4,lacticidven:4)  ) Recent Results (from the past 240 hour(s))  SARS Coronavirus 2 by RT PCR (hospital order, performed in [facility] hospital lab) Nasopharyngeal Nasopharyngeal Swab     Status: None   Collection Time: 10/21/19  9:29 PM   Specimen: Nasopharyngeal Swab  Result Value Ref Range  Status   SARS Coronavirus 2 NEGATIVE NEGATIVE Final    Comment: (NOTE) SARS-CoV-2 target nucleic acids are NOT DETECTED.  The SARS-CoV-2 RNA is generally detectable in upper and lower respiratory specimens during the acute phase of infection. The lowest concentration of SARS-CoV-2 viral copies this assay can detect is 250 copies / mL. A negative result does not preclude SARS-CoV-2 infection and should not be used as the sole basis for treatment or other patient management decisions.  A negative result may occur with improper specimen collection / handling, submission of specimen other than nasopharyngeal swab, presence of viral mutation(s) within the areas targeted by this assay, and inadequate number of viral copies (<250 copies / mL). A negative result must be combined with clinical observations, patient history, and epidemiological information.  Fact Sheet for Patients:   StrictlyIdeas.no  Fact Sheet for Healthcare Providers: BankingDealers.co.za  This test is not yet approved or  cleared by the  [place] FDA and has been authorized for detection and/or diagnosis of SARS-CoV-2 by FDA under an Emergency Use Authorization (EUA).  This EUA will remain in effect (meaning this test can be used) for the duration of the COVID-19 declaration under Section 564(b)(1) of the Act, 21 U.S.C. section 360bbb-3(b)(1), unless the authorization is terminated or revoked sooner.  Performed at [facility], [address], [place], [place] [address]       Studies: No results found.  Scheduled Meds: . colchicine  0.6 mg Oral Daily  . furosemide  40 mg Oral BID  . guaiFENesin  600 mg Oral BID  . insulin aspart  0-20 Units Subcutaneous TID WC  . insulin aspart  0-5 Units Subcutaneous QHS  . insulin glargine  25 Units Subcutaneous QHS  . loratadine  10 mg Oral Daily  . predniSONE  50 mg Oral Q breakfast  . rivaroxaban  10 mg Oral Daily    Continuous Infusions: . sodium chloride       LOS: 6 days     Alma Friendly, MD [facility]  If 7PM-7AM, please contact night-coverage [email or web address] 10/29/2019, 2:24 PM

## 2019-10-29 NOTE — Progress Notes (Addendum)
Pt and bed is wet and soiled due to pt being incontinent with bladder and stool. Writer woke pt up to inform her staff will be cleaning her up before shift change. Pt refused and stated "I haven't slept much since I've been here. Leave me alone. I will wait until next shift." Education provided about the importance of peri care, hygiene and prevention of skin breakdown. Pt acknowledged education, but continue to refuse care. Informed pt we will try again before we leave. Will continue to monitor.     06:47- Attempted to clean pt and change linen on bed. Pt continues to refuse. Pt wants to sleep more and wait until shift change. Education reinforced. Will let day shift nurse know in report.

## 2019-10-30 DIAGNOSIS — J449 Chronic obstructive pulmonary disease, unspecified: Secondary | ICD-10-CM | POA: Diagnosis not present

## 2019-10-30 DIAGNOSIS — I10 Essential (primary) hypertension: Secondary | ICD-10-CM | POA: Diagnosis not present

## 2019-10-30 DIAGNOSIS — I5033 Acute on chronic diastolic (congestive) heart failure: Secondary | ICD-10-CM | POA: Diagnosis not present

## 2019-10-30 DIAGNOSIS — N1832 Chronic kidney disease, stage 3b: Secondary | ICD-10-CM | POA: Diagnosis not present

## 2019-10-30 LAB — GLUCOSE, CAPILLARY: Glucose-Capillary: 366 mg/dL — ABNORMAL HIGH (ref 70–99)

## 2019-10-30 MED ORDER — GUAIFENESIN ER 600 MG PO TB12: 600.0000 mg | ORAL_TABLET | Freq: Two times a day (BID) | ORAL | 0 refills | Status: AC

## 2019-10-30 MED ORDER — METOPROLOL TARTRATE 50 MG PO TABS: 25.0000 mg | ORAL_TABLET | Freq: Two times a day (BID) | ORAL | 5 refills | Status: DC

## 2019-10-30 MED ORDER — SIMETHICONE 80 MG PO CHEW
80.0000 mg | CHEWABLE_TABLET | Freq: Four times a day (QID) | ORAL | Status: DC | PRN
  Administered 2019-10-30: 80 mg via ORAL
  Filled 2019-10-30: qty 1

## 2019-10-30 MED ORDER — FUROSEMIDE 40 MG PO TABS: 40.0000 mg | ORAL_TABLET | Freq: Two times a day (BID) | ORAL | 0 refills | Status: DC

## 2019-10-30 NOTE — Progress Notes (Signed)
Patient was assisted to the cab via wheelchair by [employer] and myself. Once I opened the door to the cab the patient became very upset and anxious. She kept saying she couldn't get into the cab. She was saying she needed her own handicap [name] that is at [facility]. She said she was going to pass out due to the heat. She insisted that I have EMS take her to the hotel and she was willing to pay. She was wheeled back to her room. EMS called for transport.

## 2019-10-30 NOTE — Progress Notes (Signed)
Patient was transported to the [facility] which is where she resides. Patient was transported via EMS. Discharge paperwork given to patient. IV was taken out during day shift.

## 2019-10-30 NOTE — Progress Notes (Addendum)
Patient refused for lab to draw her blood yesterday after attempting twice. She has refused labs this morning too. She refused for the CNA to check her CBG this am

## 2019-10-30 NOTE — Care Management Important Message (Signed)
Important Message  Patient Details  Name: Karla Sanchez MRN: [id number] Date of Birth: [birth year]   Medicare Important Message Given:  Yes     Dannette Barbara 10/30/2019, 11:48 AM

## 2019-10-30 NOTE — Discharge Summary (Signed)
Discharge Summary  Karla Sanchez WUX:[id number] DOB: [birth year]  PCP: Alfredia Client, MD  Admit date: 10/22/2019 Discharge date: 10/30/2019  Time spent: 40 mins  Recommendations for Outpatient Follow-up:  1. PCP in 1 week 2. Cardiology/HF clinic as scheduled   Discharge Diagnoses:  Active Hospital Problems   Diagnosis Date Noted  . Acute exacerbation of CHF (congestive heart failure) ([facility]) 09/08/2019  . Acute pulmonary edema (HCC)   . Non compliance with medical treatment   . Hypertension   . Chronic kidney disease, stage 3b   . COPD (chronic obstructive pulmonary disease) ([facility]) 10/03/2019  . Obesity, Class III, BMI 40-49.9 (morbid obesity) ([facility]) 10/03/2019  . Type 2 diabetes mellitus with stage 3 chronic kidney disease ([facility]) 07/09/2019  . Obesity, diabetes, and hypertension syndrome ([facility]) 07/09/2019    Resolved Hospital Problems  No resolved problems to display.    Discharge Condition: Stable  Diet recommendation: Mod carb/heart healthy  Vitals:   10/29/19 2022 10/30/19 0518  BP:  (!) 155/56  Pulse:  67  Resp:  18  Temp:  98.3 F (36.8 C)  SpO2: 94% 100%    History of present illness:  79 year old female with h/o chronic diastolic CHF,history of insulin dependent type II diabetes, hypertension, and COPD on supplemental oxygen. The patient reports a recent history of recurrent progressive exertional dyspnea. She appears to be medically noncompliant staying in [place]. Patient was evaluated and was to be admitted but left AMA. She was sitting in her wheelchair on the wheel chair apparently today when she had a possible syncopal episode. She was scared so she came back to the ER. She still has anasarca, appears overloaded.     Patient continues to be noncompliant, declines medications, PT OT, lab draws/IV placements, SNF placement.  Patient will be discharged, with close follow-up with heart failure clinic/cardiology as well as PCP.      Hospital Course:  Principal Problem:   Acute exacerbation of CHF (congestive heart failure) (HCC) Active Problems:   Type 2 diabetes mellitus with stage 3 chronic kidney disease (HCC)   Obesity, diabetes, and hypertension syndrome (HCC)   COPD (chronic obstructive pulmonary disease) (HCC)   Obesity, Class III, BMI 40-49.9 (morbid obesity) ([facility])   Hypertension   Chronic kidney disease, stage 3b   Non compliance with medical treatment   Acute pulmonary edema (HCC)   Acute on chronic exacerbation of diastolic HF Patient appeared to be volume overloaded on admission, currently improving after diuresis BNP 93.1, but may be falsely low in obese patients Chest x-ray with improved vascular congestion Echo done on 06/2019 showed EF of 55 to 25%, grade 2 diastolic dysfunction, moderate pulmonary hypertension 48.4 mmHg Continue p.o. Lasix Advised compliance with medication, dietary modification and close follow-up appointments Follow-up with cardiology/heart failure team as scheduled  Possible syncope No further episodes Echo as above  Diabetes mellitus type 2, uncontrolled with hyperglycemia A1c on 09/2019 was 8 Continue home regimen  CKD stage IIIb Creatinine stable Follow-up with PCP  Hypertension BP stable Reduced home metoprolol to 25mg  twice daily  COPD Stable Completed 5 days of steroids Continue inhalers  Morbid obesity Lifestyle modification advised       Malnutrition Type:      Malnutrition Characteristics:      Nutrition Interventions:      Estimated body mass index is 41.63 kg/m as calculated from the following:   Height as of this encounter: 5\' 6"  (1.676 m).   Weight as of this encounter: 117 kg.  Procedures:  None  Consultations:  None  Discharge Exam: BP (!) 155/56 (BP Location: Right Arm)   Pulse 67   Temp 98.3 F (36.8 C) (Oral)   Resp 18   Ht 5\' 6"  (1.676 m)   Wt (!) 117 kg   SpO2 100%   BMI 41.63 kg/m   General:  NAD Cardiovascular: S1, S2 present Respiratory: Diminished breath sounds bilaterally    Discharge Instructions You were cared for by a hospitalist during your hospital stay. If you have any questions about your discharge medications or the care you received while you were in the hospital after you are discharged, you can call the unit and asked to speak with the hospitalist on call if the hospitalist that took care of you is not available. Once you are discharged, your primary care physician will handle any further medical issues. Please note that NO REFILLS for any discharge medications will be authorized once you are discharged, as it is imperative that you return to your primary care physician (or establish a relationship with a primary care physician if you do not have one) for your aftercare needs so that they can reassess your need for medications and monitor your lab values.  Discharge Instructions    Diet - low sodium heart healthy   Complete by: As directed    Increase activity slowly   Complete by: As directed      Allergies as of 10/30/2019      Reactions   Codeine Nausea Only, Other (See Comments), Swelling   Other Reaction: nausea and vomiting Other reaction(s): NAUSEA   Other Other (See Comments)   Other reaction(s): SWELLING Other reaction(s): MUSCLE PAIN Muscle aches Other reaction(s): MUSCLE PAIN Muscle aches Other reaction(s): MUSCLE PAIN Muscle aches   Exenatide Nausea And Vomiting   Levofloxacin Other (See Comments)   Other Reaction: sloughing of buccal mucosa Other reaction(s): OTHER Lost skin in mouth Patient states she can take Cipro without difficulty and has taken it many times in the past Other Reaction: sloughing of buccal mucosa   Losartan Other (See Comments)   Weight gain Weight gain   Tetracyclines & Related Other (See Comments)      Medication List    STOP taking these medications   cephALEXin 500 MG capsule Commonly known as: KEFLEX      TAKE these medications   acetaminophen 325 MG tablet Commonly known as: TYLENOL Take 650 mg by mouth every 6 (six) hours as needed.   albuterol 108 (90 Base) MCG/ACT inhaler Commonly known as: VENTOLIN HFA Inhale 2 puffs into the lungs every 6 (six) hours as needed for wheezing or shortness of breath.   colchicine 0.6 MG tablet Take 0.6 mg by mouth daily.   furosemide 40 MG tablet Commonly known as: Lasix Take 1 tablet (40 mg total) by mouth 2 (two) times daily.   guaiFENesin 600 MG 12 hr tablet Commonly known as: MUCINEX Take 1 tablet (600 mg total) by mouth 2 (two) times daily for 7 days.   insulin glargine 100 UNIT/ML injection Commonly known as: LANTUS Inject 0.15 mLs (15 Units total) into the skin at bedtime.   loratadine 10 MG tablet Commonly known as: Claritin Take 1 tablet (10 mg total) by mouth daily.   Luer Lock Safety Syringes 25G X 5/8" 3 ML Misc Generic drug: SYRINGE-NEEDLE (DISP) 3 ML 15 Units by Does not apply route at bedtime.   metoprolol tartrate 50 MG tablet Commonly known as: LOPRESSOR Take 0.5 tablets (25  mg total) by mouth 2 (two) times daily. What changed: how much to take      Allergies  Allergen Reactions  . Codeine Nausea Only, Other (See Comments) and Swelling    Other Reaction: nausea and vomiting Other reaction(s): NAUSEA   . Other Other (See Comments)    Other reaction(s): SWELLING  Other reaction(s): MUSCLE PAIN Muscle aches Other reaction(s): MUSCLE PAIN Muscle aches Other reaction(s): MUSCLE PAIN Muscle aches   . Exenatide Nausea And Vomiting  . Levofloxacin Other (See Comments)    Other Reaction: sloughing of buccal mucosa Other reaction(s): OTHER Lost skin in mouth Patient states she can take Cipro without difficulty and has taken it many times in the past Other Reaction: sloughing of buccal mucosa   . Losartan Other (See Comments)    Weight gain Weight gain   . Tetracyclines & Related Other (See Comments)     Follow-up Information    [facility] Follow up on 11/03/2019.   Specialty: Cardiology Why: at 1:00pm. Enter through the [facility] entrance Contact information: [address] [place] [phone]               The results of significant diagnostics from this hospitalization (including imaging, microbiology, ancillary and laboratory) are listed below for reference.    Significant Diagnostic Studies: DG Chest 1 View  Result Date: 10/10/2019 CLINICAL DATA:  Shortness of breath.  History of COPD. EXAM: CHEST  1 VIEW COMPARISON:  10/02/2019 FINDINGS: The cardiac silhouette remains mildly enlarged. There is chronic enlargement of the central pulmonary arteries which may reflect underlying pulmonary arterial hypertension. Chronic accentuation of the interstitial markings is unchanged without overt pulmonary edema. No confluent airspace opacity, sizeable pleural effusion, pneumothorax is identified. No acute osseous abnormality is seen. IMPRESSION: Chronic findings without evidence of an acute cardiopulmonary process. Electronically Signed   By: Logan Bores M.D.   On: 10/10/2019 04:26   DG Chest 2 View  Result Date: 10/02/2019 CLINICAL DATA:  79 year old female with shortness of breath and CHF. EXAM: CHEST - 2 VIEW COMPARISON:  Chest radiograph dated 09/06/2019. FINDINGS: There is left lung base hazy density with blunting of the left costophrenic angle which may be chronic or represent small left pleural effusion and left lung base atelectasis. Pneumonia is not excluded clinical correlation is recommended. No focal consolidation or pneumothorax. The cardiac silhouette is within limits. Atherosclerotic calcification of the aorta. Degenerative changes of the spine and shoulders. No acute osseous pathology. IMPRESSION: Chronic changes versus small left pleural effusion and left lung base atelectasis. Pneumonia is not  excluded. Electronically Signed   By: Anner Crete M.D.   On: 10/02/2019 21:29   US Venous Img Lower Unilateral Right (DVT)  Result Date: 10/03/2019 CLINICAL DATA:  Right lower extremity pain and edema for past 2-3 months. Evaluate for DVT. EXAM: RIGHT LOWER EXTREMITY VENOUS DOPPLER ULTRASOUND TECHNIQUE: Gray-scale sonography with graded compression, as well as color Doppler and duplex ultrasound were performed to evaluate the lower extremity deep venous systems from the level of the common femoral vein and including the common femoral, femoral, profunda femoral, popliteal and calf veins including the posterior tibial, peroneal and gastrocnemius veins when visible. The superficial great saphenous vein was also interrogated. Spectral Doppler was utilized to evaluate flow at rest and with distal augmentation maneuvers in the common femoral, femoral and popliteal veins. COMPARISON:  Right lower extremity venous Doppler ultrasound-09/07/2019; 07/19/2019 (both examinations were negative) FINDINGS:  Contralateral Common Femoral Vein: Respiratory phasicity is normal and symmetric with the symptomatic side. No evidence of thrombus. Normal compressibility. Common Femoral Vein: No evidence of thrombus. Normal compressibility, respiratory phasicity and response to augmentation. Saphenofemoral Junction: No evidence of thrombus. Normal compressibility and flow on color Doppler imaging. Profunda Femoral Vein: No evidence of thrombus. Normal compressibility and flow on color Doppler imaging. Femoral Vein: No evidence of thrombus. Normal compressibility, respiratory phasicity and response to augmentation. Popliteal Vein: No evidence of thrombus. Normal compressibility, respiratory phasicity and response to augmentation. Calf Veins: Not well visualized. Superficial Great Saphenous Vein: No evidence of thrombus. Normal compressibility. Venous Reflux:  None. Other Findings:  None. IMPRESSION: No evidence of DVT within the right  lower extremity. Electronically Signed   By: Sandi Mariscal M.D.   On: 10/03/2019 11:18   DG Chest Port 1 View  Result Date: 10/22/2019 CLINICAL DATA:  Shortness of breath. EXAM: PORTABLE CHEST 1 VIEW COMPARISON:  Radiograph yesterday.  Additional priors. FINDINGS: Stable cardiomegaly and mediastinal contours. Aortic atherosclerosis. Similar vascular congestion. Improved interstitial/bronchial thickening. No focal airspace disease, large pleural effusion or pneumothorax. IMPRESSION: Stable cardiomegaly and vascular congestion. Improved interstitial/bronchial thickening suggesting improved pulmonary edema. Electronically Signed   By: Keith Rake M.D.   On: 10/22/2019 23:02   DG Chest Portable 1 View  Result Date: 10/21/2019 CLINICAL DATA:  Shortness of breath EXAM: PORTABLE CHEST 1 VIEW COMPARISON:  October 18, 2019 FINDINGS: The heart size and mediastinal contours are unchanged with mild cardiomegaly. There is prominence of the central pulmonary vasculature. Mildly increased interstitial markings are seen throughout both lungs. No pleural effusion is seen. No acute osseous abnormality. IMPRESSION: Diffuse mildly increased interstitial opacities which could be due to interstitial edema and/or infectious etiology. Electronically Signed   By: Prudencio Pair M.D.   On: 10/21/2019 22:32    Microbiology: Recent Results (from the past 240 hour(s))  SARS Coronavirus 2 by RT PCR (hospital order, performed in [facility] hospital lab) Nasopharyngeal Nasopharyngeal Swab     Status: None   Collection Time: 10/21/19  9:29 PM   Specimen: Nasopharyngeal Swab  Result Value Ref Range Status   SARS Coronavirus 2 NEGATIVE NEGATIVE Final    Comment: (NOTE) SARS-CoV-2 target nucleic acids are NOT DETECTED.  The SARS-CoV-2 RNA is generally detectable in upper and lower respiratory specimens during the acute phase of infection. The lowest concentration of SARS-CoV-2 viral copies this assay can detect is 250 copies /  mL. A negative result does not preclude SARS-CoV-2 infection and should not be used as the sole basis for treatment or other patient management decisions.  A negative result may occur with improper specimen collection / handling, submission of specimen other than nasopharyngeal swab, presence of viral mutation(s) within the areas targeted by this assay, and inadequate number of viral copies (<250 copies / mL). A negative result must be combined with clinical observations, patient history, and epidemiological information.  Fact Sheet for Patients:   StrictlyIdeas.no  Fact Sheet for Healthcare Providers: BankingDealers.co.za  This test is not yet approved or  cleared by the [place] FDA and has been authorized for detection and/or diagnosis of SARS-CoV-2 by FDA under an Emergency Use Authorization (EUA).  This EUA will remain in effect (meaning this test can be used) for the duration of the COVID-19 declaration under Section 564(b)(1) of the Act, 21 U.S.C. section 360bbb-3(b)(1), unless the authorization is terminated or revoked sooner.  Performed at [facility], [place], [place]  [address]      Labs: Basic Metabolic Panel: Recent Labs  Lab 10/24/19 0403 10/25/19 0857 10/26/19 0701 10/27/19 0609 10/28/19 1116  NA 140 134* 134* 134* 136  K 4.9 4.7 4.9 5.2* 4.6  CL 97* 93* 94* 93* 95*  CO2 33* 30 29 31 29   GLUCOSE 154* 291* 156* 125* 166*  BUN 37* 50* 67* 75* 77*  CREATININE 1.18* 1.58* 1.66* 1.41* 1.25*  CALCIUM 8.5* 8.3* 8.7* 8.5* 8.3*   Liver Function Tests: No results for input(s): AST, ALT, ALKPHOS, BILITOT, PROT, ALBUMIN in the last 168 hours. No results for input(s): LIPASE, AMYLASE in the last 168 hours. No results for input(s): AMMONIA in the last 168 hours. CBC: Recent Labs  Lab 10/27/19 1251 10/28/19 1116  WBC 9.9 11.0*  HGB 10.4* 10.2*  HCT 34.6* 33.9*  MCV 70.2* 70.9*  PLT  330 313   Cardiac Enzymes: No results for input(s): CKTOTAL, CKMB, CKMBINDEX, TROPONINI in the last 168 hours. BNP: BNP (last 3 results) Recent Labs    10/10/19 0337 10/21/19 2129 10/22/19 2218  BNP 133.2* 113.0* 93.1    ProBNP (last 3 results) No results for input(s): PROBNP in the last 8760 hours.  CBG: Recent Labs  Lab 10/28/19 2138 10/29/19 0853 10/29/19 1158 10/29/19 1657 10/29/19 2112  GLUCAP 249* 140* 109* 324* 213*       Signed:  Alma Friendly, MD [facility] 10/30/2019, 12:15 PM

## 2019-10-30 NOTE — TOC Transition Note (Signed)
Transition of Care [facility]) - CM/SW Discharge Note   Patient Details  Name: Karla Sanchez MRN: [id number] Date of Birth: [birth year]  [facility] CM/SW Contact:  Meriel Flavors, LCSW Phone Number: 10/30/2019, 12:49 PM   Clinical Narrative:    Patient is discharging, she has refused SNF placement. CSW provided patient with a taxi voucher back to [facility] where patient has been living. Nurse has voucher.            Patient Goals and CMS Choice        Discharge Placement                       Discharge Plan and Services                                     Social Determinants of Health (SDOH) Interventions     Readmission Risk Interventions Readmission Risk Prevention Plan 10/24/2019  Medication Review ([facility]) Complete  HRI or [place] Patient Refused

## 2019-10-31 ENCOUNTER — Telehealth (HOSPITAL_COMMUNITY): Payer: Self-pay

## 2019-10-31 NOTE — Telephone Encounter (Addendum)
Spoke with Karla Sanchez and discussed her hospital stay.  Reminded her she has HF clinic appt on Friday.  She states she will go if they can schedule her medical transport for wheel chair.  Contacted HF clinic and advised.  She states doing ok but she thinks [facility] sent her home too quick.  She is staying in hotel, she states she has no way of putting legs up, advised her to sit in the bed with pillows under her legs.  She said she can for short period of time but not long.  Discussed her refusing care and she states she took pills that were not helping, she states she did not want an IV.  She states there is nothing a doctor can do for her, she is old and has bad heart.  Advised her she could live feeling better with doing what the doctors are trying to do for her and not refuse IV's and medications.  She said yes that could be true.  She never got upset, had a pleasant talk.  Consulted with Raquel Sarna Social worker in [place] for further assistance with this case of what I can do different to prevent hospitalizations.  Will continue to visit for heart failure.   [name] [phone]

## 2019-11-03 ENCOUNTER — Ambulatory Visit: Payer: Federal, State, Local not specified - PPO | Admitting: Family
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
## 2019-11-07 ENCOUNTER — Ambulatory Visit: Payer: Federal, State, Local not specified - PPO | Admitting: Family

## 2019-11-08 ENCOUNTER — Other Ambulatory Visit (HOSPITAL_COMMUNITY): Payer: Self-pay

## 2019-11-08 NOTE — Progress Notes (Signed)
Today had a long visit with Karla Sanchez.  Her breathing appeared well today, she was speaking in full sentences.  Lungs were clear.  She had no wheezing.  Talked about her missing the HF clinic appt and she finally agreed to let me make her another appt.  She has appt tomorrow at 1:00, [place] transportation has been scheduled.  She has been advised to be dressed and ready sitting in lobby at 11:45.  Advised they should pick her up at 12:00 and will assist getting her into the [place].  Her only complaints is muscle cramps in her legs at night, she states normally been eating bananas but the hotel has been out of them.  She is concerned that her potassium may be low.  She denies any chest pain, headaches or dizziness.  She is in a manual wheelchair to get around and she scoots it with her feet.  She has food in her room, the hotel serves breakfast and has a room with food available to purchase.  She also orders delivery sometimes.  She states she is setting up transportation to make a trip to [place].  She has a wheelchair [employer] but license is expired and tags are expired.  This visit asked how I can help her and she talked a lot about going to [place] to live close to a grandson.  She states she is calling him today to see if he will be staying in that area.  She wants to be around someone in her family and her immediately family does want her around per her.  I have talked with her nephew that lives in [place] and he states he has moved her around and he just does not know what else to do.  She states she knows she needs skilled nursing now and if she does not leave [facility] she will be willing to go back to Skilled nursing.  [facility] did have a case but when she left [place] they will not open the case back up.  Have spoke to them about this [place] and they are not willing to try to help her out.  She will not stay on the subject at hand and when asked questions she does not answer.  Unable to get any vitals, she is not  weighing.  Her hotel room is getting cluttered and harder to move around in.  She states they change the bed clothes but nothing else.  Her room has a dirty urine smell to it.   Will continue to try to visit for heart failure and assist her in any way I can.   [place] [phone]

## 2019-11-09 ENCOUNTER — Telehealth (HOSPITAL_COMMUNITY): Payer: Self-pay

## 2019-11-09 ENCOUNTER — Ambulatory Visit: Payer: Federal, State, Local not specified - PPO | Admitting: Family
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
## 2019-11-09 NOTE — Telephone Encounter (Signed)
After hearing from HF clinic stating that [facility] transportation stated they never got the message about a ride for [place] to her HF clinic appt they had to cancel her appt due to lack of transportation.  This is the 3rd time transportation was an issue with [facility].  Aleysha advised not to make another appt because she is not trying it again.  She informed me she will probably be going to [place] to be with her grandson tomorrow.  Will contact her tomorrow to check to see if she moved or still in [facility].   [place] [employer] [phone]

## 2019-11-13 ENCOUNTER — Ambulatory Visit: Payer: Federal, State, Local not specified - PPO | Admitting: Family

## 2019-11-13 ENCOUNTER — Telehealth (HOSPITAL_COMMUNITY): Payer: Self-pay

## 2019-11-13 NOTE — Telephone Encounter (Signed)
Attempted to contact, no answer and unable to leave message.  Spoke to hotel operator and she advised that Karla Sanchez was still staying with them.  Will continue to try to reach.   [place] [phone]

## 2019-11-19 ENCOUNTER — Inpatient Hospital Stay
Admission: EM | Admit: 2019-11-19 | Discharge: 2019-11-24 | DRG: 291 | Disposition: A | Discharge status: Home / Self Care | Payer: Medicare Other | Attending: Internal Medicine | Admitting: Internal Medicine

## 2019-11-19 ENCOUNTER — Emergency Department: Payer: Medicare Other

## 2019-11-19 DIAGNOSIS — Z79899 Other long term (current) drug therapy: Secondary | ICD-10-CM

## 2019-11-19 DIAGNOSIS — I110 Hypertensive heart disease with heart failure: Principal | ICD-10-CM | POA: Diagnosis present

## 2019-11-19 DIAGNOSIS — Z6841 Body Mass Index (BMI) 40.0 and over, adult: Secondary | ICD-10-CM

## 2019-11-19 DIAGNOSIS — E119 Type 2 diabetes mellitus without complications: Secondary | ICD-10-CM | POA: Diagnosis present

## 2019-11-19 DIAGNOSIS — I452 Bifascicular block: Secondary | ICD-10-CM | POA: Diagnosis present

## 2019-11-19 DIAGNOSIS — I509 Heart failure, unspecified: Secondary | ICD-10-CM

## 2019-11-19 DIAGNOSIS — D509 Iron deficiency anemia, unspecified: Secondary | ICD-10-CM

## 2019-11-19 DIAGNOSIS — I5033 Acute on chronic diastolic (congestive) heart failure: Secondary | ICD-10-CM | POA: Diagnosis not present

## 2019-11-19 DIAGNOSIS — Z885 Allergy status to narcotic agent status: Secondary | ICD-10-CM

## 2019-11-19 DIAGNOSIS — Z888 Allergy status to other drugs, medicaments and biological substances status: Secondary | ICD-10-CM

## 2019-11-19 DIAGNOSIS — J441 Chronic obstructive pulmonary disease with (acute) exacerbation: Secondary | ICD-10-CM | POA: Diagnosis not present

## 2019-11-19 DIAGNOSIS — Z9119 Patient's noncompliance with other medical treatment and regimen: Secondary | ICD-10-CM

## 2019-11-19 DIAGNOSIS — Z9049 Acquired absence of other specified parts of digestive tract: Secondary | ICD-10-CM

## 2019-11-19 DIAGNOSIS — Z801 Family history of malignant neoplasm of trachea, bronchus and lung: Secondary | ICD-10-CM

## 2019-11-19 DIAGNOSIS — Z85038 Personal history of other malignant neoplasm of large intestine: Secondary | ICD-10-CM

## 2019-11-19 DIAGNOSIS — J9621 Acute and chronic respiratory failure with hypoxia: Secondary | ICD-10-CM | POA: Diagnosis not present

## 2019-11-19 DIAGNOSIS — Z881 Allergy status to other antibiotic agents status: Secondary | ICD-10-CM

## 2019-11-19 DIAGNOSIS — R300 Dysuria: Secondary | ICD-10-CM | POA: Diagnosis present

## 2019-11-19 DIAGNOSIS — M109 Gout, unspecified: Secondary | ICD-10-CM | POA: Diagnosis present

## 2019-11-19 DIAGNOSIS — Z794 Long term (current) use of insulin: Secondary | ICD-10-CM

## 2019-11-19 DIAGNOSIS — Z20822 Contact with and (suspected) exposure to covid-19: Secondary | ICD-10-CM | POA: Diagnosis present

## 2019-11-19 DIAGNOSIS — J9601 Acute respiratory failure with hypoxia: Secondary | ICD-10-CM | POA: Diagnosis present

## 2019-11-19 LAB — CBC WITH DIFFERENTIAL/PLATELET
Abs Immature Granulocytes: 0.02 10*3/uL (ref 0.00–0.07)
Basophils Absolute: 0 10*3/uL (ref 0.0–0.1)
Basophils Relative: 0 %
Eosinophils Absolute: 0.7 10*3/uL — ABNORMAL HIGH (ref 0.0–0.5)
Eosinophils Relative: 9 %
HCT: 36.6 % (ref 36.0–46.0)
Hemoglobin: 10.8 g/dL — ABNORMAL LOW (ref 12.0–15.0)
Immature Granulocytes: 0 %
Lymphocytes Relative: 29 %
Lymphs Abs: 2.3 10*3/uL (ref 0.7–4.0)
MCH: 21.8 pg — ABNORMAL LOW (ref 26.0–34.0)
MCHC: 29.5 g/dL — ABNORMAL LOW (ref 30.0–36.0)
MCV: 73.8 fL — ABNORMAL LOW (ref 80.0–100.0)
Monocytes Absolute: 0.6 10*3/uL (ref 0.1–1.0)
Monocytes Relative: 7 %
Neutro Abs: 4.4 10*3/uL (ref 1.7–7.7)
Neutrophils Relative %: 55 %
Platelets: 258 10*3/uL (ref 150–400)
RBC: 4.96 MIL/uL (ref 3.87–5.11)
RDW: 19.5 % — ABNORMAL HIGH (ref 11.5–15.5)
WBC: 8 10*3/uL (ref 4.0–10.5)
nRBC: 0 % (ref 0.0–0.2)

## 2019-11-19 LAB — TROPONIN I (HIGH SENSITIVITY): Troponin I (High Sensitivity): 10 ng/L (ref <18)

## 2019-11-19 LAB — COMPREHENSIVE METABOLIC PANEL
ALT: 9 U/L (ref 0–44)
AST: 11 U/L — ABNORMAL LOW (ref 15–41)
Albumin: 3.1 g/dL — ABNORMAL LOW (ref 3.5–5.0)
Alkaline Phosphatase: 96 U/L (ref 38–126)
Anion gap: 8 (ref 5–15)
BUN: 35 mg/dL — ABNORMAL HIGH (ref 8–23)
CO2: 30 mmol/L (ref 22–32)
Calcium: 8.4 mg/dL — ABNORMAL LOW (ref 8.9–10.3)
Chloride: 103 mmol/L (ref 98–111)
Creatinine, Ser: 0.99 mg/dL (ref 0.44–1.00)
GFR calc Af Amer: >60 mL/min (ref >60)
GFR calc non Af Amer: 54 mL/min — ABNORMAL LOW (ref >60)
Glucose, Bld: 91 mg/dL (ref 70–99)
Potassium: 4.5 mmol/L (ref 3.5–5.1)
Sodium: 141 mmol/L (ref 135–145)
Total Bilirubin: 0.6 mg/dL (ref 0.3–1.2)
Total Protein: 6.3 g/dL — ABNORMAL LOW (ref 6.5–8.1)

## 2019-11-19 LAB — BRAIN NATRIURETIC PEPTIDE: B Natriuretic Peptide: 76 pg/mL (ref 0.0–100.0)

## 2019-11-19 LAB — LACTIC ACID, PLASMA: Lactic Acid, Venous: 0.8 mmol/L (ref 0.5–1.9)

## 2019-11-19 LAB — SARS CORONAVIRUS 2 BY RT PCR (HOSPITAL ORDER, PERFORMED IN ~~LOC~~ HOSPITAL LAB): SARS Coronavirus 2: NEGATIVE

## 2019-11-19 MED ORDER — FUROSEMIDE 10 MG/ML IJ SOLN
80.0000 mg | Freq: Once | INTRAMUSCULAR | Status: AC
  Administered 2019-11-19: 80 mg via INTRAVENOUS
  Filled 2019-11-19: qty 8

## 2019-11-19 MED ORDER — SODIUM CHLORIDE 0.9 % IV SOLN
250.0000 mL | INTRAVENOUS | Status: DC | PRN
  Administered 2019-11-21: 250 mL via INTRAVENOUS

## 2019-11-19 MED ORDER — IPRATROPIUM-ALBUTEROL 0.5-2.5 (3) MG/3ML IN SOLN
3.0000 mL | Freq: Once | RESPIRATORY_TRACT | Status: AC
  Administered 2019-11-19: 3 mL via RESPIRATORY_TRACT
  Filled 2019-11-19: qty 3

## 2019-11-19 MED ORDER — SODIUM CHLORIDE 0.9% FLUSH
3.0000 mL | Freq: Two times a day (BID) | INTRAVENOUS | Status: DC
  Administered 2019-11-20 – 2019-11-23 (×5): 3 mL via INTRAVENOUS

## 2019-11-19 MED ORDER — SODIUM CHLORIDE 0.9% FLUSH: 3.0000 mL | INTRAVENOUS | Status: DC | PRN

## 2019-11-19 MED ORDER — ONDANSETRON HCL 4 MG/2ML IJ SOLN: 4.0000 mg | Freq: Four times a day (QID) | INTRAMUSCULAR | Status: DC | PRN

## 2019-11-19 MED ORDER — INSULIN GLARGINE 100 UNIT/ML ~~LOC~~ SOLN
15.0000 [IU] | Freq: Every day | SUBCUTANEOUS | Status: DC
  Administered 2019-11-20 – 2019-11-22 (×3): 15 [IU] via SUBCUTANEOUS
  Filled 2019-11-19 (×6): qty 0.15

## 2019-11-19 MED ORDER — ALPRAZOLAM 0.5 MG PO TABS
0.2500 mg | ORAL_TABLET | Freq: Two times a day (BID) | ORAL | Status: DC | PRN
  Administered 2019-11-19: 0.25 mg via ORAL
  Filled 2019-11-19: qty 1

## 2019-11-19 MED ORDER — ENOXAPARIN SODIUM 40 MG/0.4ML ~~LOC~~ SOLN
40.0000 mg | Freq: Two times a day (BID) | SUBCUTANEOUS | Status: DC
  Administered 2019-11-20: 40 mg via SUBCUTANEOUS
  Filled 2019-11-19 (×6): qty 0.4

## 2019-11-19 MED ORDER — ZOLPIDEM TARTRATE 5 MG PO TABS: 5.0000 mg | ORAL_TABLET | Freq: Every evening | ORAL | Status: DC | PRN

## 2019-11-19 MED ORDER — METOPROLOL TARTRATE 25 MG PO TABS
25.0000 mg | ORAL_TABLET | Freq: Two times a day (BID) | ORAL | Status: DC
  Administered 2019-11-20 – 2019-11-22 (×5): 25 mg via ORAL
  Filled 2019-11-19 (×8): qty 1

## 2019-11-19 MED ORDER — ACETAMINOPHEN 325 MG PO TABS
650.0000 mg | ORAL_TABLET | ORAL | Status: DC | PRN
  Administered 2019-11-22: 650 mg via ORAL
  Filled 2019-11-19: qty 2

## 2019-11-19 MED ORDER — FUROSEMIDE 10 MG/ML IJ SOLN
60.0000 mg | Freq: Two times a day (BID) | INTRAMUSCULAR | Status: DC
  Administered 2019-11-20 – 2019-11-22 (×6): 60 mg via INTRAVENOUS
  Filled 2019-11-19 (×5): qty 8

## 2019-11-19 MED ORDER — COLCHICINE 0.6 MG PO TABS
0.6000 mg | ORAL_TABLET | Freq: Every day | ORAL | Status: DC
  Administered 2019-11-19 – 2019-11-23 (×5): 0.6 mg via ORAL
  Filled 2019-11-19 (×6): qty 1

## 2019-11-19 MED ORDER — ASPIRIN EC 81 MG PO TBEC
81.0000 mg | DELAYED_RELEASE_TABLET | Freq: Every day | ORAL | Status: DC
  Administered 2019-11-20 – 2019-11-23 (×4): 81 mg via ORAL
  Filled 2019-11-19 (×4): qty 1

## 2019-11-19 NOTE — ED Notes (Signed)
Pt states that "all of my weight is in my stomach. I wonder if that's shortness of breath. But I guess there ain't no point in asking a Doctor, huh. They don't teach about that in Med school. They only teach things that we don't actually have". Pt is very rude.

## 2019-11-19 NOTE — H&P (Addendum)
[place]   PATIENT NAME: Karla Sanchez    MR#:  [id number]  DATE OF BIRTH:  [birth year]  DATE OF ADMISSION:  11/19/2019  PRIMARY CARE PHYSICIAN: Alfredia Client, MD   REQUESTING/REFERRING PHYSICIAN: Arta Silence, MD CHIEF COMPLAINT:   Chief Complaint  Patient presents with  . Shortness of Breath    HISTORY OF PRESENT ILLNESS:  Karla Sanchez  is a 79 y.o. Caucasian female with a known history of diastolic CHF COPD, type 2 diabetes mellitus, gout and hypertension, who presented to the emergency room with 1 of several admissions recently for acute onset of worsening dyspnea.  With associated dry cough and wheezing as well as worsening lower extremity edema.  She was recently discharged on home O2 that she was supposed to get at the hotel where she is staying.  She stated that there was a problem with the oxygen tank so she has not been on oxygen.  She also admits to dysuria without oliguria or hematuria urgency or frequency or flank pain.  No fever or chills.  No nausea or vomiting or abdominal pain.  She was initially upset in the emergency room and wanted to leave AMA but after I talked to her she changed her mind.  Upon presentation to the emergency room, blood pressure was 156/55 with otherwise normal vital signs.  During my interview however her pulse oximetry was 86% to 89 on room air.  On 2 L of O2 by nasal cannula she was up to 98.  Labs revealed a BUN of 35 with albumin of 3.1 with otherwise normal CMP.  BNP was 76 and high-sensitivity troponin I was 10.  Lactic acid was 0.8.  CBC showed anemia better than previous levels.  COVID-19 PCR came back negative.  Chest x-ray showed mild cardiomegaly with pulmonary vascular congestion. EKG showed sinus rhythm with a rate of 73 with prolonged PR interval, right bundle branch block and left anterior fascicular block.  She had T wave inversion in V3 through V6 as well as inferiorly.  The patient was given 80 mg of  IV Lasix and DuoNeb's.  She will be admitted to a progressive unit observation bed for further evaluation and management. PAST MEDICAL HISTORY:   Past Medical History:  Diagnosis Date  . CHF (congestive heart failure) ([place])   . Colon cancer [facility])    s/p right and left colectomy 2020  . COPD (chronic obstructive pulmonary disease) ([place])   . Diabetes mellitus without complication ([place])   . Diastolic heart failure ([place])   . Gout   . Hypertension     PAST SURGICAL HISTORY:   Past Surgical History:  Procedure Laterality Date  . COLON SURGERY    . TUBAL LIGATION  1970    SOCIAL HISTORY:   Social History   Tobacco Use  . Smoking status: Never Smoker  . Smokeless tobacco: Never Used  Substance Use Topics  . Alcohol use: Not Currently    FAMILY HISTORY:   Family History  Problem Relation Age of Onset  . Lung cancer Father     DRUG ALLERGIES:   Allergies  Allergen Reactions  . Codeine Nausea Only, Other (See Comments) and Swelling    Other Reaction: nausea and vomiting Other reaction(s): NAUSEA   . Other Other (See Comments)    Other reaction(s): SWELLING  Other reaction(s): MUSCLE PAIN Muscle aches Other reaction(s): MUSCLE PAIN Muscle aches Other reaction(s): MUSCLE PAIN Muscle aches   . Exenatide Nausea And Vomiting  .  Levofloxacin Other (See Comments)    Other Reaction: sloughing of buccal mucosa Other reaction(s): OTHER Lost skin in mouth Patient states she can take Cipro without difficulty and has taken it many times in the past Other Reaction: sloughing of buccal mucosa   . Losartan Other (See Comments)    Weight gain Weight gain   . Tetracyclines & Related Other (See Comments)    REVIEW OF SYSTEMS:   ROS As per history of present illness. All pertinent systems were reviewed above. Constitutional, HEENT, cardiovascular, respiratory, GI, GU, musculoskeletal, neuro, psychiatric, endocrine, integumentary and hematologic systems were reviewed and  are otherwise negative/unremarkable except for positive findings mentioned above in the HPI.   MEDICATIONS AT HOME:   Prior to Admission medications   Medication Sig Start Date End Date Taking? Authorizing Provider  acetaminophen (TYLENOL) 325 MG tablet Take 650 mg by mouth every 6 (six) hours as needed.   Yes [provider]  albuterol (VENTOLIN HFA) 108 (90 Base) MCG/ACT inhaler Inhale 2 puffs into the lungs every 6 (six) hours as needed for wheezing or shortness of breath. 09/29/19  Yes Darylene Price A, FNP  colchicine 0.6 MG tablet Take 0.6 mg by mouth daily. 04/13/19  Yes [provider]  furosemide (LASIX) 40 MG tablet Take 1 tablet (40 mg total) by mouth 2 (two) times daily. 10/30/19 11/29/19 Yes Alma Friendly, MD  insulin glargine (LANTUS) 100 UNIT/ML injection Inject 0.15 mLs (15 Units total) into the skin at bedtime. 07/13/19  Yes Lorella Nimrod, MD  metoprolol tartrate (LOPRESSOR) 50 MG tablet Take 0.5 tablets (25 mg total) by mouth 2 (two) times daily. 10/30/19  Yes Alma Friendly, MD  SYRINGE-NEEDLE, DISP, 3 ML (LUER LOCK SAFETY SYRINGES) 25G X 5/8" 3 ML MISC 15 Units by Does not apply route at bedtime. 07/13/19  Yes Lorella Nimrod, MD  loratadine (CLARITIN) 10 MG tablet Take 1 tablet (10 mg total) by mouth daily. Patient not taking: Reported on 11/08/2019 08/03/19 10/22/19  Arta Silence, MD      VITAL SIGNS:  Blood pressure (!) 156/55, pulse 68, resp. rate 15, height 5\' 6"  (1.676 m), weight 117 kg, SpO2 95 %.  PHYSICAL EXAMINATION:  Physical Exam  GENERAL:  79 y.o.-year-old Caucasian female patient lying in the bed with mild respiratory distress with conversational dyspnea. EYES: Pupils equal, round, reactive to light and accommodation. No scleral icterus. Extraocular muscles intact.  HEENT: Head atraumatic, normocephalic. Oropharynx and nasopharynx clear.  NECK: Diminished bibasal breath sounds with bibasal crackles.  Diffuse expiratory wheezes with  slightly diminished expiratory airflow. CARDIOVASCULAR: Regular rate and rhythm, S1, S2 normal. No murmurs, rubs, or gallops.  ABDOMEN: Soft, nondistended, nontender. Bowel sounds present. No organomegaly or mass.  EXTREMITIES: 2+ left lower extremity pitting edema with no cyanosis, or clubbing.  NEUROLOGIC: Cranial nerves II through XII are intact. Muscle strength 5/5 in all extremities. Sensation intact. Gait not checked.  PSYCHIATRIC: The patient is alert and oriented x 3.  Normal affect and good eye contact. SKIN: No obvious rash, lesion, or ulcer.   LABORATORY PANEL:   CBC Recent Labs  Lab 11/19/19 2013  WBC 8.0  HGB 10.8*  HCT 36.6  PLT 258   ------------------------------------------------------------------------------------------------------------------  Chemistries  Recent Labs  Lab 11/19/19 2013  NA 141  K 4.5  CL 103  CO2 30  GLUCOSE 91  BUN 35*  CREATININE 0.99  CALCIUM 8.4*  AST 11*  ALT 9  ALKPHOS 96  BILITOT 0.6   ------------------------------------------------------------------------------------------------------------------  Cardiac Enzymes No results for input(s): TROPONINI in the last 168 hours. ------------------------------------------------------------------------------------------------------------------  RADIOLOGY:  DG Chest Portable 1 View  Result Date: 11/19/2019 CLINICAL DATA:  Cough and shortness of breath EXAM: PORTABLE CHEST 1 VIEW COMPARISON:  October 22, 2019 FINDINGS: The heart size and mediastinal contours are unchanged with mild cardiomegaly. Aortic knob calcifications. There is mild prominence of the central pulmonary vasculature. No large airspace consolidation or pleural effusion. No acute osseous abnormality. 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 IMPRESSION: Mild cardiomegaly and pulmonary vascular congestion. Electronically Signed   By: Prudencio Pair M.D.   On: 11/19/2019 19:05      IMPRESSION AND PLAN:   1.   Acute on chronic diastolic CHF, likely exacerbated by lack of oxygen and subsequent acute on chronic hypoxic respiratory failure -The patient will be admitted to an observation progressive unit bed. -He will be diuresed with IV Lasix. -Her most recent 2D echo revealed an EF of 55 to 60% with grade 2 diastolic dysfunction, decreased right ventricular function, l moderate left atrial dilatation and mild to moderate right atrial dilatation with mild mitral regurgitation. -We will follow serial troponin I's. -Case management consult will be obtained to rearrange for home O2.  2.  Mild COPD exacerbation. -We will place her on scheduled and as needed duo nebs. -We will hold off steroids at this time. -We will continue her Spiriva.  3.  Type 2 diabetes mellitus. -The patient will be placed on supplement coverage with NovoLog and will continue her basal coverage.  4.  Gout. -We will continue colchicine and allopurinol.  5.  DVT prophylaxis. -Subcutaneous Lovenox.    All the records are reviewed and case discussed with ED provider. The plan of care was discussed in details with the patient (and family). I answered all questions. The patient agreed to proceed with the above mentioned plan. Further management will depend upon hospital course.   CODE STATUS: Full code  Status is: Observation  The patient remains OBS appropriate and will d/c before 2 midnights.  Dispo: The patient is from: Home              Anticipated d/c is to: Home              Anticipated d/c date is: 1 day              Patient currently is not medically stable to d/c.   TOTAL TIME TAKING CARE OF THIS PATIENT: 55 minutes.    Christel Mormon M.D on 11/19/2019 at 10:04 PM  [facility]   From 7 PM-7 AM, contact night-coverage [email or web address]  CC: Primary care physician; Alfredia Client, MD   Note: This dictation was prepared with Dragon dictation along with smaller phrase technology. Any  transcriptional typo errors that result from this process are unintentional.

## 2019-11-19 NOTE — ED Provider Notes (Addendum)
[facility] Emergency Department Provider Note ____________________________________________   First MD Initiated Contact with Patient 11/19/19 1743     (approximate)  I have reviewed the triage vital signs and the nursing notes.   HISTORY  Chief Complaint Shortness of Breath    HPI Karla Sanchez is a 79 y.o. female with PMH as noted below including CHF, COPD, DM, chronically noncompliant and living in a hotel who presents with multiple complaints.  Primarily, the patient reports shortness of breath.  She states that this has been present for weeks and worsening over the last few days.  She reports associated cough productive of a small amount of sputum, but no fever or chest pain.  The patient states that she is supposed to be on 3 L of oxygen, and after her last hospitalization had an oxygen tank delivered to the hotel but states that she was unable to figure out how to use it and was not given any instructions.  Therefore, she has not been on any oxygen for the last several weeks.  The patient also reports bilateral lower extremity swelling, somewhat worse on the right, and associated with redness over the last few days.  In addition, she has had dysuria and frequency and feels like she has a urinary tract infection.  Past Medical History:  Diagnosis Date  . CHF (congestive heart failure) ([place])   . Colon cancer [facility])    s/p right and left colectomy 2020  . COPD (chronic obstructive pulmonary disease) ([place])   . Diabetes mellitus without complication ([place])   . Diastolic heart failure ([place])   . Gout   . Hypertension     Patient Active Problem List   Diagnosis Date Noted  . Acute on chronic diastolic (congestive) heart failure ([place]) 11/19/2019  . Acute pulmonary edema (H[facility])   . Acute on chronic diastolic CHF (congestive heart failure) ([place]) 10/22/2019  . Cellulitis of right foot 10/22/2019  . Non compliance with medical treatment   . Chronic  respiratory failure with hypoxia ([place])   . Hypertension   . Chronic kidney disease, stage 3b   . Acute on chronic respiratory failure with hypoxia ([place])   . Diarrhea   . COPD (chronic obstructive pulmonary disease) ([place]) 10/03/2019  . Obesity, Class III, BMI 40-49.9 (morbid obesity) ([place]) 10/03/2019  . Acute exacerbation of CHF (congestive heart failure) ([place]) 09/08/2019  . Acute CHF (congestive heart failure) ([place]) 09/07/2019  . COPD exacerbation ([place]) 07/09/2019  . Chronic diastolic CHF (congestive heart failure) ([place]) 07/09/2019  . Type 2 diabetes mellitus with stage 3 chronic kidney disease ([place]) 07/09/2019  . Peripheral edema 07/09/2019  . Gout 07/09/2019  . Obesity, diabetes, and hypertension syndrome ([place]) 07/09/2019  . Anemia of chronic disease 07/09/2019    Past Surgical History:  Procedure Laterality Date  . COLON SURGERY    . TUBAL LIGATION  1970    Prior to Admission medications   Medication Sig Start Date End Date Taking? Authorizing Provider  acetaminophen (TYLENOL) 325 MG tablet Take 650 mg by mouth every 6 (six) hours as needed.   Yes [provider]  albuterol (VENTOLIN HFA) 108 (90 Base) MCG/ACT inhaler Inhale 2 puffs into the lungs every 6 (six) hours as needed for wheezing or shortness of breath. 09/29/19  Yes Darylene Price A, FNP  colchicine 0.6 MG tablet Take 0.6 mg by mouth daily. 04/13/19  Yes [provider]  furosemide (LASIX) 40 MG tablet Take 1 tablet (40 mg total) by  mouth 2 (two) times daily. 10/30/19 11/29/19 Yes Alma Friendly, MD  insulin glargine (LANTUS) 100 UNIT/ML injection Inject 0.15 mLs (15 Units total) into the skin at bedtime. 07/13/19  Yes Lorella Nimrod, MD  metoprolol tartrate (LOPRESSOR) 50 MG tablet Take 0.5 tablets (25 mg total) by mouth 2 (two) times daily. 10/30/19  Yes Alma Friendly, MD  SYRINGE-NEEDLE, DISP, 3 ML (LUER LOCK SAFETY SYRINGES) 25G X 5/8" 3 ML MISC 15 Units by Does not apply route at bedtime. 07/13/19   Yes Lorella Nimrod, MD  loratadine (CLARITIN) 10 MG tablet Take 1 tablet (10 mg total) by mouth daily. Patient not taking: Reported on 11/08/2019 08/03/19 10/22/19  Arta Silence, MD    Allergies Codeine, Other, Exenatide, Levofloxacin, Losartan, and Tetracyclines & related  Family History  Problem Relation Age of Onset  . Lung cancer Father     Social History Social History   Tobacco Use  . Smoking status: Never Smoker  . Smokeless tobacco: Never Used  Substance Use Topics  . Alcohol use: Not Currently  . Drug use: Never    Review of Systems  Constitutional: No fever/chills. Eyes: No redness. ENT: No sore throat. Cardiovascular: Denies chest pain. Respiratory: Positive for shortness of breath. Gastrointestinal: No vomiting or diarrhea.  Genitourinary: Positive for dysuria. Musculoskeletal: Negative for back pain. Skin: Negative for rash. Neurological: Negative for headache.   ____________________________________________   PHYSICAL EXAM:  VITAL SIGNS: ED Triage Vitals  Enc Vitals Group     BP 11/19/19 1832 (!) 156/55     Pulse Rate 11/19/19 1746 68     Resp 11/19/19 1746 18     Temp --      Temp src --      SpO2 11/19/19 1746 95 %     Weight 11/19/19 1858 257 lb 15 oz (117 kg)     Height 11/19/19 1858 5\' 6"  (1.676 m)     Head Circumference --      Peak Flow --      Pain Score 11/19/19 1740 0     Pain Loc --      Pain Edu? --      Excl. in [place]? --     Constitutional: Alert and oriented.  Uncomfortable appearing but in no acute distress. Eyes: Conjunctivae are normal.  Head: Atraumatic. Nose: No congestion/rhinnorhea. Mouth/Throat: Mucous membranes are moist.   Neck: Normal range of motion.  Cardiovascular: Normal rate, regular rhythm. Grossly normal heart sounds.  Good peripheral circulation. Respiratory: Slightly increased respiratory effort.  No retractions.  Faint rales bilaterally. Gastrointestinal: Soft and nontender. No distention.   Genitourinary: No flank tenderness. Musculoskeletal: 1+ lower extremity edema on the left, 2+ on the right.  Very faint erythema to the distal right lower extremity with no induration or abnormal warmth.  Extremities warm and well perfused.  Neurologic:  Normal speech and language. No gross focal neurologic deficits are appreciated.  Skin:  Skin is warm and dry. No rash noted. Psychiatric: Mood and affect are normal. Speech and behavior are normal.  ____________________________________________   LABS (all labs ordered are listed, but only abnormal results are displayed)  Labs Reviewed  COMPREHENSIVE METABOLIC PANEL - Abnormal; Notable for the following components:      Result Value   BUN 35 (*)    Calcium 8.4 (*)    Total Protein 6.3 (*)    Albumin 3.1 (*)    AST 11 (*)    GFR calc non Af Amer 54 (*)  All other components within normal limits  CBC WITH DIFFERENTIAL/PLATELET - Abnormal; Notable for the following components:   Hemoglobin 10.8 (*)    MCV 73.8 (*)    MCH 21.8 (*)    MCHC 29.5 (*)    RDW 19.5 (*)    Eosinophils Absolute 0.7 (*)    All other components within normal limits  SARS CORONAVIRUS 2 BY RT PCR (HOSPITAL ORDER, [place] [facility])  LACTIC ACID, PLASMA  BRAIN NATRIURETIC PEPTIDE  URINALYSIS, COMPLETE (UACMP) WITH MICROSCOPIC  BASIC METABOLIC PANEL  TROPONIN I (HIGH SENSITIVITY)   ____________________________________________  EKG  ED ECG REPORT I, Arta Silence, the attending physician, personally viewed and interpreted this ECG.  Date: 11/19/2019 EKG Time: 1733 Rate: 73 Rhythm: normal sinus rhythm QRS Axis: normal Intervals: RBBB, LAFB ST/T Wave abnormalities: normal Narrative Interpretation: Nonspecific abnormalities with no evidence of acute ischemia; no significant change when compared to EKG of 10/22/2019  ____________________________________________  RADIOLOGY  CXR: Mild cardiomegaly and vascular  congestion  ____________________________________________   PROCEDURES  Procedure(s) performed: No  Procedures  Critical Care performed: No ____________________________________________   INITIAL IMPRESSION / ASSESSMENT AND PLAN / ED COURSE  Pertinent labs & imaging results that were available during my care of the patient were reviewed by me and considered in my medical decision making (see chart for details).  79 year old female with PMH as noted above including CHF and COPD (who is supposed to be on 3 L home O2) presents with worsening shortness of breath over the last few weeks associated with a cough, as well as worsening bilateral leg swelling and dysuria and frequency.  I reviewed the past medical records in [place].  The patient has had multiple prior ED visits and admissions here.  Per the prior notes, she has been chronically noncompliant with treatments and follow-up, and lives in a hotel.  She was most recently admitted at the beginning of August.  It appears that on discharge she was supposed to have arrangements made for home O2, and states that an oxygen tank was delivered but that she was not given any instructions on its use and therefore has not been on oxygen for the last few weeks.  The patient also has a history of declining IVs and being uncooperative during her ED visits.  On exam today when I initially came to the room, the patient demanded to be immediately transferred to [facility] and was complaining about the RN that had attempted an IV.  However after I started to talk to her I was able to redirect her and she is now more cooperative.  She has increased work of breathing but no acute respiratory distress.  O2 saturation is in the mid 90s on 3 L by nasal cannula.  Her other vital signs are normal.  She has some rales on lung exam and bilateral lower extremity edema, worse on the right and with very faint erythema.  I explained to the patient that without doing an initial  work-up here, there would be no way to transfer her anywhere.  I also that we can only transfer her if it is medically necessary, or if she has a physician at another facility who she is known to and would be able to accept her (which she does not have).  I also advised her that currently all the area hospitals are full and on diversion, so even if she had an indication for transfer it might be several days before she would actually be  transferred.  The patient expressed understanding and agreed to work-up here.  1.  Shortness of breath: Most likely CHF versus COPD, likely exacerbated by the fact that the patient has not been on oxygen.  We will obtain chest x-ray and labs.  She likely will need diuresis.  2.  Urinary symptoms: We will obtain UA to rule out UTI  3.  Lower extremity edema: Overall this appears to be consistent with edema related to CHF.  There is very faint redness to the right lower extremity but no significant induration, and I have a low suspicion for cellulitis.  ----------------------------------------- 10:33 PM on 11/19/2019 -----------------------------------------  Chest x-ray shows mild vascular congestion consistent with mild CHF.  However the troponin and BNP are normal.  Lactic is also normal and she is Covid negative.  The patient's other labs are within normal limits for her.  Approximately 30 minutes ago I went to reassess the patient and discussed disposition with her.  Her work-up is reassuring and consistent with mild CHF, so ordinarily I would not consider admission.  However, given the fact that the patient is apparently unable to use her oxygen at the hotel I discussed a safe disposition plan with her.  She stated that she wanted to be admitted, and I felt that this was reasonable since it would give time for her to be diuresed and TOC to see her and help figure out what could be done with oxygen.  I then gave report to Dr. Sidney Ace from the hospitalist  service.  However a few minutes ago I was informed by the RNs that the patient was upset and wanted to leave AMA.  Throughout her ED stay and prior visits she has complained about the RNs multiple times, and apparently got upset at the [facility] when he crossed his arms at some point while talking to her.  She was complaining about her IV, and per Ena Dawley, the Lasix had partly extravasated.    At this time, the patient is alert and oriented x4.  She is demanding to leave and states her primary reason is that she is upset with the nursing care.  She understands that without further monitoring and care, and without functioning home O2, she is at risk for hypoxia and other risks not limited to permanent lung or heart damage, permanent disability, and/or death.  She expressed understanding of these risks and was able to paraphrase them back to me.  She demonstrates appropriate decision-making capacity.  She understands she may return at anytime if she changes her mind or has worsening symptoms.   When I asked the patient about her complaints about the nurses, she did not want to talk to me further, accused me of "playing God" and said "fuck you, now let me leave."   Dr. Sidney Ace is now at the bedside talking to the patient.   ----------------------------------------- 10:43 PM on 11/19/2019 -----------------------------------------  After talking Dr. Sidney Ace, the patient has now agreed to be admitted.   ____________________________________________   FINAL CLINICAL IMPRESSION(S) / ED DIAGNOSES  Final diagnoses:  Acute on chronic congestive heart failure, unspecified heart failure type ([place])      NEW MEDICATIONS STARTED DURING THIS VISIT:  New Prescriptions   No medications on file     Note:  This document was prepared using Dragon voice recognition software and may include unintentional dictation errors.    Arta Silence, MD 11/19/19 2694      Arta Silence, MD 11/19/19  [id number]

## 2019-11-19 NOTE — ED Triage Notes (Signed)
Per EMS, called to [facility] d/t pt c/o SOB x "several days". Pt resides at this hotel and cannot walk can "only pivot". [employer] staff takes care of pt per EMS. Pt states that she was discharged 2 weeks ago and "has an oxygen tank in my room but wasn't told to use it and it doesn't work". Upon arrival to ED pt is refusing IV access and labs to be done at this time d/t "feeling faint" and pt is also yelling at staff. Pt is refusing treatment at this time. Pt is very rude with staff. EKG monitor placed. Pt is stating "call that ambulance back and get me to [place]!".

## 2019-11-19 NOTE — ED Notes (Signed)
Pt refusing care, refusing blood draw. Refusing IV, states "i'm going to [facility], that's where I want to go". This RN explained to pt the need for obtaining IV access and blood work quickly. Pt refuses to let this RN obtain IV in R arm stating "I broke that arm and you can't put an IV in it." explained to pt that she has wonderful veins in R arm and it would be very quick and easy. Pt remains refusing IV in R arm. This RN attempted access on L arm and vein blew. Pt refuses blood draw from anywhere but that specific spot on L arm. Pt refusing RN to look at [facility] for IV. Pt yells at this RN "can't you just do this later?" RN explained again the need to obtain blood work quickly. Pt refusing IV and blood draw at this time. Pt continued to yell at this RN to get out of room.

## 2019-11-19 NOTE — ED Notes (Signed)
Pharm consulted Karla Sanchez advised alternating heat and cool with elevation, EDP apprised, heat pack applied and pt left arm elevated, pt advised on treatment protocol and danger of extravasation, pt reports "It's fine" and declines hylenex SQ injection at periphery of site  Area appears with 1.5 cm x 0.5 cm raised area, same color and temp as surrounding skin, pt denies pain or further intervention

## 2019-11-19 NOTE — ED Notes (Signed)
This RN went in to attempt IV access. Pt states she "never has any trouble with IVs", but is very particular about where IV is placed. Went to attempt access in left inner forearm and pt states "no! I have to live with this if I get admitted!". Educated pt that wherever she has the best veins is the appropriate placement for access to prevent further sticks. Pt states, "any nurse that's seen a vein can get my IV.". Attempted to start access in outer left forearm and pt states that that isn't a satisfactory area for her either. Finally gained IV access in the top of pt's forearm, per her request but will not draw back blood for labs. Educated pt that she will have to be stuck again for labs if we can't get it to draw back.

## 2019-11-19 NOTE — ED Notes (Signed)
Pt yelling at this RN, unable to partner with pt for positive outcome  10 mls of lasix appears extravasated   Pt wants IV removed (prior to meds) and refusing another, and now wants to leave

## 2019-11-19 NOTE — ED Notes (Signed)
ED Provider at bedside. 

## 2019-11-20 ENCOUNTER — Other Ambulatory Visit: Payer: Self-pay

## 2019-11-20 DIAGNOSIS — M109 Gout, unspecified: Secondary | ICD-10-CM | POA: Diagnosis present

## 2019-11-20 DIAGNOSIS — Z885 Allergy status to narcotic agent status: Secondary | ICD-10-CM | POA: Diagnosis not present

## 2019-11-20 DIAGNOSIS — I5033 Acute on chronic diastolic (congestive) heart failure: Secondary | ICD-10-CM | POA: Diagnosis present

## 2019-11-20 DIAGNOSIS — I509 Heart failure, unspecified: Secondary | ICD-10-CM | POA: Diagnosis present

## 2019-11-20 DIAGNOSIS — Z9049 Acquired absence of other specified parts of digestive tract: Secondary | ICD-10-CM | POA: Diagnosis not present

## 2019-11-20 DIAGNOSIS — D509 Iron deficiency anemia, unspecified: Secondary | ICD-10-CM | POA: Diagnosis present

## 2019-11-20 DIAGNOSIS — C189 Malignant neoplasm of colon, unspecified: Secondary | ICD-10-CM | POA: Diagnosis not present

## 2019-11-20 DIAGNOSIS — Z888 Allergy status to other drugs, medicaments and biological substances status: Secondary | ICD-10-CM | POA: Diagnosis not present

## 2019-11-20 DIAGNOSIS — Z20822 Contact with and (suspected) exposure to covid-19: Secondary | ICD-10-CM | POA: Diagnosis present

## 2019-11-20 DIAGNOSIS — Z85038 Personal history of other malignant neoplasm of large intestine: Secondary | ICD-10-CM | POA: Diagnosis not present

## 2019-11-20 DIAGNOSIS — I110 Hypertensive heart disease with heart failure: Secondary | ICD-10-CM | POA: Diagnosis present

## 2019-11-20 DIAGNOSIS — Z6841 Body Mass Index (BMI) 40.0 and over, adult: Secondary | ICD-10-CM | POA: Diagnosis not present

## 2019-11-20 DIAGNOSIS — Z794 Long term (current) use of insulin: Secondary | ICD-10-CM | POA: Diagnosis not present

## 2019-11-20 DIAGNOSIS — Z9119 Patient's noncompliance with other medical treatment and regimen: Secondary | ICD-10-CM | POA: Diagnosis not present

## 2019-11-20 DIAGNOSIS — R300 Dysuria: Secondary | ICD-10-CM | POA: Diagnosis present

## 2019-11-20 DIAGNOSIS — Z881 Allergy status to other antibiotic agents status: Secondary | ICD-10-CM | POA: Diagnosis not present

## 2019-11-20 DIAGNOSIS — J9601 Acute respiratory failure with hypoxia: Secondary | ICD-10-CM | POA: Diagnosis present

## 2019-11-20 DIAGNOSIS — D500 Iron deficiency anemia secondary to blood loss (chronic): Secondary | ICD-10-CM | POA: Diagnosis not present

## 2019-11-20 DIAGNOSIS — J441 Chronic obstructive pulmonary disease with (acute) exacerbation: Secondary | ICD-10-CM | POA: Diagnosis present

## 2019-11-20 DIAGNOSIS — Z801 Family history of malignant neoplasm of trachea, bronchus and lung: Secondary | ICD-10-CM | POA: Diagnosis not present

## 2019-11-20 DIAGNOSIS — Z79899 Other long term (current) drug therapy: Secondary | ICD-10-CM | POA: Diagnosis not present

## 2019-11-20 DIAGNOSIS — I452 Bifascicular block: Secondary | ICD-10-CM | POA: Diagnosis present

## 2019-11-20 DIAGNOSIS — E119 Type 2 diabetes mellitus without complications: Secondary | ICD-10-CM | POA: Diagnosis present

## 2019-11-20 LAB — BASIC METABOLIC PANEL
Anion gap: 8 (ref 5–15)
BUN: 31 mg/dL — ABNORMAL HIGH (ref 8–23)
CO2: 30 mmol/L (ref 22–32)
Calcium: 8.2 mg/dL — ABNORMAL LOW (ref 8.9–10.3)
Chloride: 103 mmol/L (ref 98–111)
Creatinine, Ser: 0.87 mg/dL (ref 0.44–1.00)
GFR calc Af Amer: >60 mL/min (ref >60)
GFR calc non Af Amer: >60 mL/min (ref >60)
Glucose, Bld: 171 mg/dL — ABNORMAL HIGH (ref 70–99)
Potassium: 4.6 mmol/L (ref 3.5–5.1)
Sodium: 141 mmol/L (ref 135–145)

## 2019-11-20 LAB — FERRITIN: Ferritin: 4 ng/mL — ABNORMAL LOW (ref 11–307)

## 2019-11-20 LAB — GLUCOSE, CAPILLARY
Glucose-Capillary: 131 mg/dL — ABNORMAL HIGH (ref 70–99)
Glucose-Capillary: 185 mg/dL — ABNORMAL HIGH (ref 70–99)

## 2019-11-20 LAB — IRON AND TIBC
Iron: 23 ug/dL — ABNORMAL LOW (ref 28–170)
Saturation Ratios: 5 % — ABNORMAL LOW (ref 10.4–31.8)
TIBC: 434 ug/dL (ref 250–450)
UIBC: 411 ug/dL

## 2019-11-20 LAB — FIBRIN DERIVATIVES D-DIMER (ARMC ONLY): Fibrin derivatives D-dimer (ARMC): 1755.75 ng/mL (FEU) — ABNORMAL HIGH (ref 0.00–499.00)

## 2019-11-20 LAB — VITAMIN B12: Vitamin B-12: 254 pg/mL (ref 180–914)

## 2019-11-20 MED ORDER — BUDESONIDE 0.25 MG/2ML IN SUSP
0.2500 mg | Freq: Two times a day (BID) | RESPIRATORY_TRACT | Status: DC
  Administered 2019-11-20 – 2019-11-22 (×2): 0.25 mg via RESPIRATORY_TRACT
  Filled 2019-11-20 (×6): qty 2

## 2019-11-20 MED ORDER — IPRATROPIUM-ALBUTEROL 0.5-2.5 (3) MG/3ML IN SOLN
3.0000 mL | RESPIRATORY_TRACT | Status: DC | PRN
  Administered 2019-11-21: 3 mL via RESPIRATORY_TRACT
  Filled 2019-11-20: qty 3

## 2019-11-20 MED ORDER — ALPRAZOLAM 0.5 MG PO TABS
0.5000 mg | ORAL_TABLET | Freq: Three times a day (TID) | ORAL | Status: DC | PRN
  Administered 2019-11-20 – 2019-11-24 (×3): 0.5 mg via ORAL
  Filled 2019-11-20 (×3): qty 1

## 2019-11-20 MED ORDER — IPRATROPIUM-ALBUTEROL 0.5-2.5 (3) MG/3ML IN SOLN
3.0000 mL | Freq: Three times a day (TID) | RESPIRATORY_TRACT | Status: DC
  Administered 2019-11-20: 3 mL via RESPIRATORY_TRACT
  Filled 2019-11-20 (×2): qty 3

## 2019-11-20 MED ORDER — INSULIN ASPART 100 UNIT/ML ~~LOC~~ SOLN
0.0000 [IU] | Freq: Three times a day (TID) | SUBCUTANEOUS | Status: DC
  Administered 2019-11-20: 3 [IU] via SUBCUTANEOUS
  Administered 2019-11-20: 4 [IU] via SUBCUTANEOUS
  Administered 2019-11-21 – 2019-11-22 (×4): 3 [IU] via SUBCUTANEOUS
  Administered 2019-11-22 (×2): 4 [IU] via SUBCUTANEOUS
  Filled 2019-11-20 (×9): qty 1

## 2019-11-20 MED ORDER — POLYETHYLENE GLYCOL 3350 17 G PO PACK: 17.0000 g | PACK | Freq: Every day | ORAL | Status: DC | PRN

## 2019-11-20 MED ORDER — SENNOSIDES-DOCUSATE SODIUM 8.6-50 MG PO TABS: 2.0000 | ORAL_TABLET | Freq: Every evening | ORAL | Status: DC | PRN

## 2019-11-20 NOTE — ED Notes (Addendum)
Dr Reesa Chew at bedside; iv team consult ordered. Per Dr. Reesa Chew, if we don't have IV established within 1 hour, he will order oral lasix and skip morning IV dosage. Pt agrees to IV team trying for new line. This nurse did not find a suitable vein in left arm.

## 2019-11-20 NOTE — ED Notes (Signed)
[name] gown and linens changed; purewick placed.

## 2019-11-20 NOTE — Progress Notes (Signed)
Pt refused to be stuck for PIV; pt stated, "Not now, I want to sleep; I haven't had any sleep". Primary RN was notified of pt's refusal.

## 2019-11-20 NOTE — Progress Notes (Signed)
Pt sitting up in chair at this time per her request.  Call bell within reach.  Pt verbalized understanding to not try to ambulate without calling for assistance.

## 2019-11-20 NOTE — Progress Notes (Signed)
Pt arrived to 153 for continued medical treatment, alert and oriented. Denies pain,  No morning meds were given in the ED nor noon insulin coverage.  I will give the daily medications but not the bid orders so as to not give too close to evening doses.

## 2019-11-20 NOTE — ED Notes (Signed)
Pt state she wants to leave. Pt unable to walk, states she needs O2. Pt does not understand that her copd is significant and that she will likely continue to have breathing issues.

## 2019-11-20 NOTE — ED Notes (Signed)
ED TO INPATIENT HANDOFF REPORT  ED Nurse Name and Phone #: Stanton Kidney [phone]  S Name/Age/Gender Roderic Palau 79 y.o. female Room/Bed: ED15A/ED15A  Code Status   Code Status: Full Code  Home/SNF/Other Home Patient oriented to: self, place, time and situation Is this baseline? 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 Pt resides at this hotel and cannot walk can "only pivot". [employer] staff takes care of pt per EMS. Pt states that she was discharged 2 weeks ago and "has an oxygen tank in my room but wasn't told to use it and it doesn't work". Upon arrival to ED pt is refusing IV access and labs to be done at this time d/t "feeling faint" and pt is also yelling at staff. Pt is refusing treatment at this time. Pt is very rude with staff. EKG monitor placed. Pt is stating "call that ambulance back and get me to [place]!".     Allergies Allergies  Allergen Reactions  . Codeine Nausea Only, Other (See Comments) and Swelling    Other Reaction: nausea and vomiting Other reaction(s): NAUSEA   . Other Other (See Comments)    Other reaction(s): SWELLING  Other reaction(s): MUSCLE PAIN Muscle aches Other reaction(s): MUSCLE PAIN Muscle aches Other reaction(s): MUSCLE PAIN Muscle aches   . Exenatide Nausea And Vomiting  . Levofloxacin Other (See Comments)    Other Reaction: sloughing of buccal mucosa Other reaction(s): OTHER Lost skin in mouth Patient states she can take Cipro without difficulty and has taken it many times in the past Other Reaction: sloughing of buccal mucosa   . Losartan Other (See Comments)    Weight gain Weight gain   . Tetracyclines & Related Other (See Comments)    Level of Care/Admitting Diagnosis ED Disposition    ED Disposition Condition Comment    Admit  Hospital Area: [place] [[id number]]  Level of Care: Med-Surg [16]  Covid Evaluation: Confirmed COVID Negative  Diagnosis: CHF exacerbation [facility]) [[id number]]  Admitting Physician: Christel Mormon [[id number]]  Attending Physician: Christel Mormon [[id number]]  Estimated length of stay: 3 - 4 days  Certification:: I certify this patient will need inpatient services for at least 2 midnights       B Medical/Surgery History Past Medical History:  Diagnosis Date  . CHF (congestive heart failure) ([place])   . Colon cancer [facility])    s/p right and left colectomy 2020  . COPD (chronic obstructive pulmonary disease) ([place])   . Diabetes mellitus without complication ([place])   . Diastolic heart failure ([place])   . Gout   . Hypertension    Past Surgical History:  Procedure Laterality Date  . COLON SURGERY    . TUBAL LIGATION  1970     A IV Location/Drains/Wounds Patient Lines/Drains/Airways Status    Active Line/Drains/Airways    Name Placement date Placement time Site Days   Peripheral IV 11/19/19 Left Forearm 11/19/19  1844  Forearm  1   Peripheral IV 11/20/19 Left;Anterior Forearm 11/20/19  1118  Forearm  less than 1          Intake/Output Last 24 hours No intake or output data in the 24 hours ending 11/20/19 1354  Labs/Imaging Results for orders placed or performed during the hospital encounter of 11/19/19 (from the past 48 hour(s))  SARS Coronavirus 2 by RT PCR (hospital order, performed in [facility] hospital lab) Nasopharyngeal Nasopharyngeal Swab     Status: None   Collection Time: 11/19/19  6:13 PM   Specimen: Nasopharyngeal Swab  Result Value Ref Range   SARS Coronavirus 2 NEGATIVE NEGATIVE    Comment: (NOTE) SARS-CoV-2 target nucleic acids are NOT DETECTED.  The SARS-CoV-2 RNA is generally detectable in upper and lower respiratory specimens during the acute phase of infection. The lowest concentration of SARS-CoV-2 viral copies this assay can detect is  250 copies / mL. A negative result does not preclude SARS-CoV-2 infection and should not be used as the sole basis for treatment or other patient management decisions.  A negative result may occur with improper specimen collection / handling, submission of specimen other than nasopharyngeal swab, presence of viral mutation(s) within the areas targeted by this assay, and inadequate number of viral copies (<250 copies / mL). A negative result must be combined with clinical observations, patient history, and epidemiological information.  Fact Sheet for Patients:   StrictlyIdeas.no  Fact Sheet for Healthcare Providers: BankingDealers.co.za  This test is not yet approved or  cleared by the [place] FDA and has been authorized for detection and/or diagnosis of SARS-CoV-2 by FDA under an Emergency Use Authorization (EUA).  This EUA will remain in effect (meaning this test can be used) for the duration of the COVID-19 declaration under Section 564(b)(1) of the Act, 21 U.S.C. section 360bbb-3(b)(1), unless the authorization is terminated or revoked sooner.  Performed at [facility], [address] [place], [place] [address]   Lactic acid, plasma     Status: None   Collection Time: 11/19/19  8:12 PM  Result Value Ref Range   Lactic Acid, Venous 0.8 0.5 - 1.9 mmol/L    Comment: Performed at [facility], [address], [place] [address]  Troponin I (High Sensitivity)     Status: None   Collection Time: 11/19/19  8:13 PM  Result Value Ref Range   Troponin I (High Sensitivity) 10 <18 ng/L    Comment: (NOTE) Elevated high sensitivity troponin I (hsTnI) values and significant  changes across serial measurements may suggest ACS but many other  chronic and acute conditions are known to elevate hsTnI results.  Refer to the "Links" section for chest pain algorithms and additional  guidance. Performed at [facility], [address] [place], [place] [address]   Brain natriuretic peptide     Status: None   Collection Time: 11/19/19  8:13 PM  Result Value Ref Range   B Natriuretic Peptide 76.0 0.0 - 100.0 pg/mL    Comment: Performed at [facility], [address] [place], [place] [address]  Comprehensive metabolic panel     Status: Abnormal   Collection Time: 11/19/19  8:13 PM  Result Value Ref Range   Sodium 141 135 - 145 mmol/L   Potassium 4.5 3.5 - 5.1 mmol/L   Chloride 103 98 - 111 mmol/L   CO2 30 22 - 32 mmol/L   Glucose, Bld 91 70 - 99 mg/dL    Comment: Glucose reference range applies only to samples taken after fasting for at least 8 hours.   BUN 35 (H) 8 - 23 mg/dL   Creatinine, Ser 0.99 0.44 - 1.00 mg/dL   Calcium 8.4 (L) 8.9 - 10.3 mg/dL   Total Protein 6.3 (L) 6.5 - 8.1 g/dL   Albumin 3.1 (L) 3.5 - 5.0 g/dL  AST 11 (L) 15 - 41 U/L   ALT 9 0 - 44 U/L   Alkaline Phosphatase 96 38 - 126 U/L   Total Bilirubin 0.6 0.3 - 1.2 mg/dL   GFR calc non Af Amer 54 (L) >60 mL/min   GFR calc Af Amer >60 >60 mL/min   Anion gap 8 5 - 15    Comment: Performed at [facility], [address] [place], [place] [address]  CBC with Differential     Status: Abnormal   Collection Time: 11/19/19  8:13 PM  Result Value Ref Range   WBC 8.0 4.0 - 10.5 K/uL   RBC 4.96 3.87 - 5.11 MIL/uL   Hemoglobin 10.8 (L) 12.0 - 15.0 g/dL   HCT 36.6 36 - 46 %   MCV 73.8 (L) 80.0 - 100.0 fL   MCH 21.8 (L) 26.0 - 34.0 pg   MCHC 29.5 (L) 30.0 - 36.0 g/dL   RDW 19.5 (H) 11.5 - 15.5 %   Platelets 258 150 - 400 K/uL   nRBC 0.0 0.0 - 0.2 %   Neutrophils Relative % 55 %   Neutro Abs 4.4 1.7 - 7.7 K/uL   Lymphocytes Relative 29 %   Lymphs Abs 2.3 0.7 - 4.0 K/uL   Monocytes Relative 7 %   Monocytes Absolute 0.6 0 - 1 K/uL   Eosinophils Relative 9 %   Eosinophils Absolute 0.7 (H) 0 - 0 K/uL   Basophils Relative 0 %   Basophils Absolute 0.0 0 - 0 K/uL   Immature Granulocytes 0 %   Abs  Immature Granulocytes 0.02 0.00 - 0.07 K/uL    Comment: Performed at [facility], [address], [place], [place] [address]  Basic metabolic panel     Status: Abnormal   Collection Time: 11/20/19 11:38 AM  Result Value Ref Range   Sodium 141 135 - 145 mmol/L   Potassium 4.6 3.5 - 5.1 mmol/L   Chloride 103 98 - 111 mmol/L   CO2 30 22 - 32 mmol/L   Glucose, Bld 171 (H) 70 - 99 mg/dL    Comment: Glucose reference range applies only to samples taken after fasting for at least 8 hours.   BUN 31 (H) 8 - 23 mg/dL   Creatinine, Ser 0.87 0.44 - 1.00 mg/dL   Calcium 8.2 (L) 8.9 - 10.3 mg/dL   GFR calc non Af Amer >60 >60 mL/min   GFR calc Af Amer >60 >60 mL/min   Anion gap 8 5 - 15    Comment: Performed at [facility], [place], [place] [address]  Fibrin derivatives [facility] only)     Status: Abnormal   Collection Time: 11/20/19 11:38 AM  Result Value Ref Range   Fibrin derivatives D-dimer ([facility]) 1,755.75 (H) 0.00 - 499.00 ng/mL (FEU)    Comment: (NOTE) <> Exclusion of Venous Thromboembolism (VTE) - OUTPATIENT ONLY   (Emergency Department or [place])    0-499 ng/ml (FEU): With a low to intermediate pretest probability                      for VTE this test result excludes the diagnosis                      of VTE.   >499 ng/ml (FEU) : VTE not excluded; additional work up for VTE is  required.  <> Testing on Inpatients and Evaluation of Disseminated Intravascular   Coagulation (DIC) Reference Range:   0-499 ng/ml (FEU) Performed at [facility], [address] [place], [place] [address]   Ferritin     Status: Abnormal   Collection Time: 11/20/19 11:38 AM  Result Value Ref Range   Ferritin 4 (L) 11 - 307 ng/mL    Comment: Performed at [facility], [address] [place], [place] [address]  Iron and TIBC     Status: Abnormal   Collection Time: 11/20/19 11:38 AM  Result Value Ref Range   Iron 23 (L) 28  - 170 ug/dL   TIBC 434 250 - 450 ug/dL   Saturation Ratios 5 (L) 10.4 - 31.8 %   UIBC 411 ug/dL    Comment: Performed at [facility], [address] [place], [place] [address]   DG Chest Portable 1 View  Result Date: 11/19/2019 CLINICAL DATA:  Cough and shortness of breath EXAM: PORTABLE CHEST 1 VIEW COMPARISON:  October 22, 2019 FINDINGS: The heart size and mediastinal contours are unchanged with mild cardiomegaly. Aortic knob calcifications. There is mild prominence of the central pulmonary vasculature. No large airspace consolidation or pleural effusion. No acute osseous abnormality. 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 IMPRESSION: Mild cardiomegaly and pulmonary vascular congestion. Electronically Signed   By: Prudencio Pair M.D.   On: 11/19/2019 19:05    Pending Labs Unresulted Labs (From admission, onward)          Start     Ordered   11/21/19 9147  Basic metabolic panel  Daily,   STAT     Question:  Specimen collection method  Answer:  Lab=Lab collect   11/20/19 0919   11/21/19 0500  Magnesium  Daily,   STAT     Question:  Specimen collection method  Answer:  Lab=Lab collect   11/20/19 0919   11/21/19 0500  CBC  Daily,   STAT     Question:  Specimen collection method  Answer:  Lab=Lab collect   11/20/19 0919   11/20/19 0920  Vitamin B12  Once,   STAT       Question:  Specimen collection method  Answer:  Lab=Lab collect   11/20/19 0919   11/20/19 0920  Folate RBC  Once,   STAT       Question:  Specimen collection method  Answer:  Lab=Lab collect   11/20/19 0919   11/20/19 8295  Basic metabolic panel  Daily,   STAT      11/19/19 2204   11/19/19 1756  Urinalysis, Complete w Microscopic  ONCE - STAT,   STAT        11/19/19 1755          Vitals/Pain Today's Vitals   11/19/19 2003 11/19/19 2350 11/20/19 1000 11/20/19 1130  BP: (!) 154/98   (!) 127/52  Pulse: 69 76 70   Resp: 14     SpO2: 93%  99%   Weight:      Height:      PainSc:         Isolation Precautions No active isolations  Medications Medications  colchicine tablet 0.6 mg (0.6 mg Oral Given 11/19/19 2350)  metoprolol tartrate (LOPRESSOR) tablet 25 mg (25 mg Oral Refused 11/19/19 2350)  insulin glargine (LANTUS) injection 15 Units (15 Units Subcutaneous Refused 11/19/19 2348)  sodium chloride flush (NS) 0.9 % injection 3 mL (3 mLs Intravenous Not Given 11/20/19 0606)  sodium chloride  flush (NS) 0.9 % injection 3 mL (has no administration in time range)  0.9 %  sodium chloride infusion (has no administration in time range)  acetaminophen (TYLENOL) tablet 650 mg (has no administration in time range)  ondansetron (ZOFRAN) injection 4 mg (has no administration in time range)  enoxaparin (LOVENOX) injection 40 mg (40 mg Subcutaneous Refused 11/19/19 2348)  furosemide (LASIX) injection 60 mg (60 mg Intravenous Given 11/20/19 1130)  aspirin EC tablet 81 mg (has no administration in time range)  ALPRAZolam (XANAX) tablet 0.5 mg (has no administration in time range)  ipratropium-albuterol (DUONEB) 0.5-2.5 (3) MG/3ML nebulizer solution 3 mL (has no administration in time range)  insulin aspart (novoLOG) injection 0-20 Units (0 Units Subcutaneous Refused 11/20/19 0810)  budesonide (PULMICORT) nebulizer solution 0.25 mg (0.25 mg Nebulization Given 11/20/19 0947)  polyethylene glycol (MIRALAX / GLYCOLAX) packet 17 g (has no administration in time range)  senna-docusate (Senokot-S) tablet 2 tablet (has no administration in time range)  ipratropium-albuterol (DUONEB) 0.5-2.5 (3) MG/3ML nebulizer solution 3 mL (has no administration in time range)  furosemide (LASIX) injection 80 mg (80 mg Intravenous Given 11/19/19 2157)  ipratropium-albuterol (DUONEB) 0.5-2.5 (3) MG/3ML nebulizer solution 3 mL (3 mLs Nebulization Given 11/19/19 2158)  ipratropium-albuterol (DUONEB) 0.5-2.5 (3) MG/3ML nebulizer solution 3 mL (3 mLs Nebulization Given 11/19/19 2157)     Mobility non-ambulatory Moderate fall risk   Focused Assessments Cardiac Assessment Handoff:    No results found for: CKTOTAL, CKMB, CKMBINDEX, TROPONINI No results found for: DDIMER Does the Patient currently have chest pain? No      R Recommendations: See Admitting Provider Note  Report given to:   Additional Notes: pt can stand (with assistance) and pivot. Home is a hotel. Pt has seen care management on more than one occasion.

## 2019-11-20 NOTE — ED Notes (Signed)
Pt states she does not want "to get an iv and be sent home." Pt states she wants to sleep. Given the option of participating in her care once more.   Pt lying on wet bedsheet as she removed her brief but refuses to have linens changed.

## 2019-11-20 NOTE — ED Notes (Addendum)
Pt in hospital bed, warm blankets given, pt cleaned of incontinent urine episode. Pt states she is more comfortable and is ready to sleep.  Pt is refusing cardiac monitoring, pulse ox, and BP cuff. Pt states "I cannot sleep with this and I cannot wear these when I sleep I don't need them".   No other needs expressed at this time. NAD noted. Pt compliant with wearing O2.

## 2019-11-20 NOTE — Progress Notes (Signed)
PROGRESS NOTE    Karla Sanchez  GYJ:[id number] DOB: Oct 27, 1940 DOA: 11/19/2019 PCP: Alfredia Client, MD   Brief Narrative:  79 y.o. Caucasian female with a known history of diastolic CHF COPD, type 2 diabetes mellitus, gout and hypertension, who presented to the emergency room with 1 of several admissions recently for acute onset of worsening dyspnea.    She was admitted for diastolic CHF exacerbation and hypoxia   Assessment & Plan:   Active Problems:   Acute on chronic diastolic (congestive) heart failure (HCC)   CHF exacerbation (HCC)  Acute hypoxic respiratory failure secondary to diastolic congestive heart failure, class III -Recent echo shows EF of 55 to 26%, grade 2 diastolic dysfunction -Hypoxia requiring 2-3 L nasal cannula -Continue Lasix 60 mg IV twice daily -Fluid restriction, daily weights, replete electrolytes as necessary  History of COPD with very mild exacerbation -No need for steroids.  Scheduled and as needed bronchodilators, Pulmicort twice daily.  Out of bed to chair.  I-S/flutter  Microcytic anemia -No obvious evidence of bleeding.  Will check iron studies, B12 and folate  DM2 -Insulin sliding scale and Accu-Cheks. -Lantus 15 units at bedtime  History of gout -On colchicine and allopurinol  Essential hypertension -Lopressor twice daily   DVT prophylaxis: Lovenox Code Status: Full code Family Communication:    Status is: Inpatient  Remains inpatient appropriate because:IV treatments appropriate due to intensity of illness or inability to take PO   Dispo: The patient is from: Home              Anticipated d/c is to: Home              Anticipated d/c date is: 2 days              Patient currently is not medically stable to d/c.  Patient is volume overloaded requiring 2-3 L nasal cannula.  Currently on IV diuretics       Body mass index is 41.63 kg/m.       Subjective: Does have exertional shortness of breath.   Desaturates less than 90% on room air.  Review of Systems Otherwise negative except as per HPI, including: General: Denies fever, chills, night sweats or unintended weight loss. Resp: Denies hemoptysis Cardiac: Denies chest pain, palpitations, orthopnea, paroxysmal nocturnal dyspnea. GI: Denies abdominal pain, nausea, vomiting, diarrhea or constipation GU: Denies dysuria, frequency, hesitancy or incontinence MS: Denies muscle aches, joint pain or swelling Neuro: Denies headache, neurologic deficits (focal weakness, numbness, tingling), abnormal gait Psych: Denies anxiety, depression, SI/HI/AVH Skin: Denies new rashes or lesions ID: Denies sick contacts, exotic exposures, travel  Examination:  General exam: Appears calm and comfortable, 3 L nasal cannula Respiratory system: Bibasilar crackles Cardiovascular system: S1 & S2 heard, RRR. No JVD, murmurs, rubs, gallops or clicks, 2+ bilateral lower extremity pitting edema Gastrointestinal system: Abdomen is nondistended, soft and nontender. No organomegaly or masses felt. Normal bowel sounds heard. Central nervous system: Alert and oriented. No focal neurological deficits. Extremities: Symmetric 5 x 5 power. Skin: No rashes, lesions or ulcers Psychiatry: Judgement and insight appear normal. Mood & affect appropriate.     Objective: Vitals:   11/19/19 1832 11/19/19 1858 11/19/19 2003 11/19/19 2350  BP: (!) 156/55  (!) 154/98   Pulse:   69 76  Resp: 15  14   SpO2:   93%   Weight:  117 kg    Height:  5\' 6"  (1.676 m)     No intake or output data in  the 24 hours ending 11/20/19 0920 Filed Weights   11/19/19 1858  Weight: 117 kg     Data Reviewed:   CBC: Recent Labs  Lab 11/19/19 2013  WBC 8.0  NEUTROABS 4.4  HGB 10.8*  HCT 36.6  MCV 73.8*  PLT 381   Basic Metabolic Panel: Recent Labs  Lab 11/19/19 2013  NA 141  K 4.5  CL 103  CO2 30  GLUCOSE 91  BUN 35*  CREATININE 0.99  CALCIUM 8.4*   GFR: Estimated  Creatinine Clearance: 59.9 mL/min (by C-G formula based on SCr of 0.99 mg/dL). Liver Function Tests: Recent Labs  Lab 11/19/19 2013  AST 11*  ALT 9  ALKPHOS 96  BILITOT 0.6  PROT 6.3*  ALBUMIN 3.1*   No results for input(s): LIPASE, AMYLASE in the last 168 hours. No results for input(s): AMMONIA in the last 168 hours. Coagulation Profile: No results for input(s): INR, PROTIME in the last 168 hours. Cardiac Enzymes: No results for input(s): CKTOTAL, CKMB, CKMBINDEX, TROPONINI in the last 168 hours. BNP (last 3 results) No results for input(s): PROBNP in the last 8760 hours. HbA1C: No results for input(s): HGBA1C in the last 72 hours. CBG: No results for input(s): GLUCAP in the last 168 hours. Lipid Profile: No results for input(s): CHOL, HDL, LDLCALC, TRIG, CHOLHDL, LDLDIRECT in the last 72 hours. Thyroid Function Tests: No results for input(s): TSH, T4TOTAL, FREET4, T3FREE, THYROIDAB in the last 72 hours. Anemia Panel: No results for input(s): VITAMINB12, FOLATE, FERRITIN, TIBC, IRON, RETICCTPCT in the last 72 hours. Sepsis Labs: Recent Labs  Lab 11/19/19 2012  LATICACIDVEN 0.8    Recent Results (from the past 240 hour(s))  SARS Coronavirus 2 by RT PCR (hospital order, performed in [facility] hospital lab) Nasopharyngeal Nasopharyngeal Swab     Status: None   Collection Time: 11/19/19  6:13 PM   Specimen: Nasopharyngeal Swab  Result Value Ref Range Status   SARS Coronavirus 2 NEGATIVE NEGATIVE Final    Comment: (NOTE) SARS-CoV-2 target nucleic acids are NOT DETECTED.  The SARS-CoV-2 RNA is generally detectable in upper and lower respiratory specimens during the acute phase of infection. The lowest concentration of SARS-CoV-2 viral copies this assay can detect is 250 copies / mL. A negative result does not preclude SARS-CoV-2 infection and should not be used as the sole basis for treatment or other patient management decisions.  A negative result may occur  with improper specimen collection / handling, submission of specimen other than nasopharyngeal swab, presence of viral mutation(s) within the areas targeted by this assay, and inadequate number of viral copies (<250 copies / mL). A negative result must be combined with clinical observations, patient history, and epidemiological information.  Fact Sheet for Patients:   StrictlyIdeas.no  Fact Sheet for Healthcare Providers: BankingDealers.co.za  This test is not yet approved or  cleared by the [place] FDA and has been authorized for detection and/or diagnosis of SARS-CoV-2 by FDA under an Emergency Use Authorization (EUA).  This EUA will remain in effect (meaning this test can be used) for the duration of the COVID-19 declaration under Section 564(b)(1) of the Act, 21 U.S.C. section 360bbb-3(b)(1), unless the authorization is terminated or revoked sooner.  Performed at [facility], [address], [place], [place] [address]          Radiology Studies: DG Chest Portable 1 View  Result Date: 11/19/2019 CLINICAL DATA:  Cough and shortness of breath EXAM: PORTABLE CHEST 1 VIEW COMPARISON:  October 22, 2019 FINDINGS: The heart size and mediastinal contours are unchanged with mild cardiomegaly. Aortic knob calcifications. There is mild prominence of the central pulmonary vasculature. No large airspace consolidation or pleural effusion. No acute osseous abnormality. 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 IMPRESSION: Mild cardiomegaly and pulmonary vascular congestion. Electronically Signed   By: Prudencio Pair M.D.   On: 11/19/2019 19:05        Scheduled Meds: . aspirin EC  81 mg Oral Daily  . budesonide (PULMICORT) nebulizer solution  0.25 mg Nebulization BID  . colchicine  0.6 mg Oral Daily  . enoxaparin (LOVENOX) injection  40 mg Subcutaneous BID  . furosemide  60 mg Intravenous Q12H  . insulin aspart   0-20 Units Subcutaneous TID AC & HS  . insulin glargine  15 Units Subcutaneous QHS  . metoprolol tartrate  25 mg Oral BID  . sodium chloride flush  3 mL Intravenous Q12H   Continuous Infusions: . sodium chloride       LOS: 0 days   Time spent= 35 mins    Jesiah Yerby Arsenio Loader, MD [facility]  If 7PM-7AM, please contact night-coverage  11/20/2019, 9:20 AM

## 2019-11-20 NOTE — Progress Notes (Signed)
VAST consulted to obtain IV access. Pt reported right arm is broken and cannot be used.  Left arm IV started utilizing [place]. Pt has small and limited vasculature in left arm. ER nurse notified that IV was obtained, but labs could not be collected from access.

## 2019-11-20 NOTE — ED Notes (Signed)
IV team unable to place IV/draw labs. Pt refused, stating "I want to sleep, leave me alone".

## 2019-11-20 NOTE — TOC Initial Note (Signed)
Transition of Care [facility]) - Initial/Assessment Note    Patient Details  Name: Karla Sanchez MRN: [id number] Date of Birth: 15-Nov-1940  Transition of Care [facility]) CM/SW Contact:    Ova Freshwater Phone Number: [phone] 11/20/2019, 1:54 PM  Clinical Narrative:                  Patient presents to the ED due to SOB.  Patient's last d/c from [facility] was 10/30/2019.  Patient received O2 from [place]. Patient is uncooperative and agitated. TOC will continue to follow.  Expected Discharge Plan: Skilled Nursing Facility Barriers to Discharge: Continued Medical Work up   Patient Goals and CMS Choice   CMS Medicare.gov Compare Post Acute Care list provided to:: Patient Choice offered to / list presented to : Patient  Expected Discharge Plan and Services Expected Discharge Plan: [place] In-house Referral: Clinical Social Work     Living arrangements for the past 2 months: Hotel/Motel                                      Prior Living Arrangements/Services Living arrangements for the past 2 months: Hotel/Motel Lives with:: Self Patient language and need for interpreter reviewed:: Yes Do you feel safe going back to the place where you live?: Yes      Need for Family Participation in Patient Care: No (Comment) Care giver support system in place?: No (comment) Current home services: DME, Other (comment), Housekeeping (O2) Criminal Activity/Legal Involvement Pertinent to Current Situation/Hospitalization: No - Comment as needed  Activities of Daily Living      Permission Sought/Granted                  Emotional Assessment Appearance:: Appears older than stated age Attitude/Demeanor/Rapport: Paranoid Affect (typically observed): Agitated Orientation: : Oriented to Self, Oriented to Place, Oriented to Situation Alcohol / Substance Use: Not Applicable Psych Involvement: No (comment)  Admission diagnosis:  Acute on chronic diastolic  (congestive) heart failure ([place]) [I50.33] CHF exacerbation ([place]) [I50.9] Patient Active Problem List   Diagnosis Date Noted  . CHF exacerbation ([place]) 11/20/2019  . Acute on chronic diastolic (congestive) heart failure ([place]) 11/19/2019  . Acute pulmonary edema (HCC)   . Acute on chronic diastolic CHF (congestive heart failure) ([place]) 10/22/2019  . Cellulitis of right foot 10/22/2019  . Non compliance with medical treatment   . Chronic respiratory failure with hypoxia ([place])   . Hypertension   . Chronic kidney disease, stage 3b   . Acute on chronic respiratory failure with hypoxia ([place])   . Diarrhea   . COPD (chronic obstructive pulmonary disease) ([place]) 10/03/2019  . Obesity, Class III, BMI 40-49.9 (morbid obesity) ([place]) 10/03/2019  . Acute exacerbation of CHF (congestive heart failure) ([place]) 09/08/2019  . Acute CHF (congestive heart failure) ([place]) 09/07/2019  . COPD exacerbation ([place]) 07/09/2019  . Chronic diastolic CHF (congestive heart failure) ([place]) 07/09/2019  . Type 2 diabetes mellitus with stage 3 chronic kidney disease ([place]) 07/09/2019  . Peripheral edema 07/09/2019  . Gout 07/09/2019  . Obesity, diabetes, and hypertension syndrome ([place]) 07/09/2019  . Anemia of chronic disease 07/09/2019   PCP:  Alfredia Client, MD Pharmacy:   [id number] [address], [place] [address] Phone: [phone] Fax: [phone]  [place],  [place] [address] Phone: [phone] Fax: [phone]     Social Determinants of Health (SDOH) Interventions    Readmission Risk Interventions Readmission Risk Prevention Plan 10/24/2019  Medication Review ([place]) Complete  HRI or [place] Patient Refused

## 2019-11-20 NOTE — ED Notes (Signed)
IV team consult placed per Randol Kern, NP. D/t pt being a hard stick with minimal access.  This RN attempted a blood draw and IV but pt refused, pt stated "your RN on your badge is green so that means you're new so I refuse to let you".

## 2019-11-21 ENCOUNTER — Encounter: Payer: Self-pay | Admitting: Family Medicine

## 2019-11-21 ENCOUNTER — Inpatient Hospital Stay: Payer: Medicare Other

## 2019-11-21 DIAGNOSIS — D500 Iron deficiency anemia secondary to blood loss (chronic): Secondary | ICD-10-CM

## 2019-11-21 DIAGNOSIS — C189 Malignant neoplasm of colon, unspecified: Secondary | ICD-10-CM

## 2019-11-21 LAB — CBC
HCT: 37 % (ref 36.0–46.0)
Hemoglobin: 10.5 g/dL — ABNORMAL LOW (ref 12.0–15.0)
MCH: 21.4 pg — ABNORMAL LOW (ref 26.0–34.0)
MCHC: 28.4 g/dL — ABNORMAL LOW (ref 30.0–36.0)
MCV: 75.4 fL — ABNORMAL LOW (ref 80.0–100.0)
Platelets: 258 10*3/uL (ref 150–400)
RBC: 4.91 MIL/uL (ref 3.87–5.11)
RDW: 19.2 % — ABNORMAL HIGH (ref 11.5–15.5)
WBC: 7 10*3/uL (ref 4.0–10.5)
nRBC: 0 % (ref 0.0–0.2)

## 2019-11-21 LAB — BASIC METABOLIC PANEL
Anion gap: 8 (ref 5–15)
BUN: 29 mg/dL — ABNORMAL HIGH (ref 8–23)
CO2: 30 mmol/L (ref 22–32)
Calcium: 8.4 mg/dL — ABNORMAL LOW (ref 8.9–10.3)
Chloride: 101 mmol/L (ref 98–111)
Creatinine, Ser: 0.96 mg/dL (ref 0.44–1.00)
GFR calc Af Amer: >60 mL/min (ref >60)
GFR calc non Af Amer: 56 mL/min — ABNORMAL LOW (ref >60)
Glucose, Bld: 121 mg/dL — ABNORMAL HIGH (ref 70–99)
Potassium: 4.8 mmol/L (ref 3.5–5.1)
Sodium: 139 mmol/L (ref 135–145)

## 2019-11-21 LAB — GLUCOSE, CAPILLARY
Glucose-Capillary: 126 mg/dL — ABNORMAL HIGH (ref 70–99)
Glucose-Capillary: 143 mg/dL — ABNORMAL HIGH (ref 70–99)
Glucose-Capillary: 95 mg/dL (ref 70–99)
Glucose-Capillary: 131 mg/dL — ABNORMAL HIGH (ref 70–99)

## 2019-11-21 LAB — FOLATE RBC
Folate, Hemolysate: 378 ng/mL
Folate, RBC: 1038 ng/mL (ref >498)
Hematocrit: 36.4 % (ref 34.0–46.6)

## 2019-11-21 LAB — MAGNESIUM: Magnesium: 2 mg/dL (ref 1.7–2.4)

## 2019-11-21 MED ORDER — IOHEXOL 350 MG/ML SOLN: 75.0000 mL | Freq: Once | INTRAVENOUS | Status: DC | PRN

## 2019-11-21 MED ORDER — ALBUTEROL SULFATE (2.5 MG/3ML) 0.083% IN NEBU
2.5000 mg | INHALATION_SOLUTION | RESPIRATORY_TRACT | Status: DC | PRN
  Administered 2019-11-21: 2.5 mg via RESPIRATORY_TRACT
  Filled 2019-11-21: qty 3

## 2019-11-21 MED ORDER — IPRATROPIUM-ALBUTEROL 0.5-2.5 (3) MG/3ML IN SOLN
3.0000 mL | Freq: Two times a day (BID) | RESPIRATORY_TRACT | Status: DC
  Administered 2019-11-22: 3 mL via RESPIRATORY_TRACT
  Filled 2019-11-21 (×4): qty 3

## 2019-11-21 MED ORDER — SODIUM CHLORIDE 0.9 % IV SOLN
510.0000 mg | Freq: Once | INTRAVENOUS | Status: AC
  Administered 2019-11-21: 510 mg via INTRAVENOUS
  Filled 2019-11-21: qty 17

## 2019-11-21 MED ORDER — BENZONATATE 100 MG PO CAPS
200.0000 mg | ORAL_CAPSULE | Freq: Three times a day (TID) | ORAL | Status: DC | PRN
  Administered 2019-11-22 – 2019-11-24 (×3): 200 mg via ORAL
  Filled 2019-11-21 (×3): qty 2

## 2019-11-21 MED ORDER — GUAIFENESIN-DM 100-10 MG/5ML PO SYRP
5.0000 mL | ORAL_SOLUTION | ORAL | Status: DC | PRN
  Administered 2019-11-21 (×2): 5 mL via ORAL
  Filled 2019-11-21 (×2): qty 5

## 2019-11-21 NOTE — Progress Notes (Signed)
PROGRESS NOTE    Karla Sanchez  VPX:[id number] DOB: 10-11-1940 DOA: 11/19/2019 PCP: Alfredia Client, MD   Brief Narrative:  79 y.o. Caucasian female with a known history of diastolic CHF COPD, type 2 diabetes mellitus, gout and hypertension, who presented to the emergency room with 1 of several admissions recently for acute onset of worsening dyspnea.    She was admitted for diastolic CHF exacerbation and hypoxia   Assessment & Plan:   Active Problems:   Acute on chronic diastolic (congestive) heart failure (HCC)   CHF exacerbation (HCC)  Acute hypoxic respiratory failure secondary to diastolic congestive heart failure, class III -Recent echo shows EF of 55 to 46%, grade 2 diastolic dysfunction; still on 4L [place] -Cont IV lasix 60mg  IV -Fluid restriction, daily weights, replete electrolytes as necessary  Iron Deficiency Anemia.  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 C scope in Jan 2021 showed - Internal Hemorrhoid, previous hx of Colon mass- adenocarcinoma?  -IV iron-Feraheme ordered.  Will need oral supplements with bowel regimen upon discharge -Supposed to be following with [facility].  I extensively discussed this with her and she would like to resume her care at [facility] upon discharge.  I have also offered her oncology/GI services if she plans on staying in town but she refused.  History of COPD with very mild exacerbation -No need for steroids.  Scheduled and as needed bronchodilators, Pulmicort twice daily.  Out of bed to chair.  I-S/flutter  DM2 -Insulin sliding scale and Accu-Cheks. -Lantus 15 units at bedtime  History of gout -On colchicine and allopurinol  Essential hypertension -Lopressor twice daily   DVT prophylaxis: Lovenox Code Status: Full code Family Communication:    Status is: Inpatient  Remains inpatient appropriate because:IV treatments appropriate due to intensity of illness or inability to take PO   Dispo: The patient is  from: Home              Anticipated d/c is to: Home              Anticipated d/c date is: 1 day              Patient currently is not medically stable to d/c.  She needs at least 1 more day of IV diuretics.  Hopefully she can be discharged tomorrow.   Body mass index is 41.63 kg/m.     Subjective: Initially patient was refusing IV iron but after I had discussion with her she was agreeable to take it.  She tells me that she follows at [facility] for colon cancer.  Post colectomy she was supposed to follow-up with oncology but never did.  She understands that she will resume care with her as soon as she is discharged.  She will other resume her care there and follow-up with anyone here locally for continuity purposes. Denies any gross bleeding or dark stools. Still having some exertional dyspnea  Review of Systems Otherwise negative except as per HPI, including: General: Denies fever, chills, night sweats or unintended weight loss. Resp: Denies cough, wheezing, shortness of breath. Cardiac: Denies chest pain, palpitations, orthopnea, paroxysmal nocturnal dyspnea. GI: Denies abdominal pain, nausea, vomiting, diarrhea or constipation GU: Denies dysuria, frequency, hesitancy or incontinence MS: Denies muscle aches, joint pain or swelling Neuro: Denies headache, neurologic deficits (focal weakness, numbness, tingling), abnormal gait Psych: Denies anxiety, depression, SI/HI/AVH Skin: Denies new rashes or lesions ID: Denies sick contacts, exotic exposures, travel  Examination: Constitutional: Not in acute distress Respiratory: Bibasilar crackles Cardiovascular:  Normal sinus rhythm, no rubs Abdomen: Nontender nondistended good bowel sounds Musculoskeletal: 2+ bilateral lower extremity pitting edema Skin: No rashes seen Neurologic: CN 2-12 grossly intact.  And nonfocal Psychiatric: Normal judgment and insight. Alert and oriented x 3. Normal mood.    Objective: Vitals:   11/20/19 1546  11/20/19 1950 11/20/19 2110 11/21/19 0036  BP: (!) 154/100 (!) 136/42 (!) 166/57 (!) 149/53  Pulse: 79 62 72 (!) 59  Resp: 17 16 18 16   Temp: 98.2 F (36.8 C) (!) 97.4 F (36.3 C) 97.6 F (36.4 C) 97.8 F (36.6 C)  TempSrc:  Oral Oral Oral  SpO2: 94% 100% 94% 94%  Weight:      Height:        Intake/Output Summary (Last 24 hours) at 11/21/2019 0818 Last data filed at 11/20/2019 2042 Gross per 24 hour  Intake 200 ml  Output --  Net 200 ml   Filed Weights   11/19/19 1858  Weight: 117 kg     Data Reviewed:   CBC: Recent Labs  Lab 11/19/19 2013  WBC 8.0  NEUTROABS 4.4  HGB 10.8*  HCT 36.6  MCV 73.8*  PLT 893   Basic Metabolic Panel: Recent Labs  Lab 11/19/19 2013 11/20/19 1138  NA 141 141  K 4.5 4.6  CL 103 103  CO2 30 30  GLUCOSE 91 171*  BUN 35* 31*  CREATININE 0.99 0.87  CALCIUM 8.4* 8.2*   GFR: Estimated Creatinine Clearance: 68.2 mL/min (by C-G formula based on SCr of 0.87 mg/dL). Liver Function Tests: Recent Labs  Lab 11/19/19 2013  AST 11*  ALT 9  ALKPHOS 96  BILITOT 0.6  PROT 6.3*  ALBUMIN 3.1*   No results for input(s): LIPASE, AMYLASE in the last 168 hours. No results for input(s): AMMONIA in the last 168 hours. Coagulation Profile: No results for input(s): INR, PROTIME in the last 168 hours. Cardiac Enzymes: No results for input(s): CKTOTAL, CKMB, CKMBINDEX, TROPONINI in the last 168 hours. BNP (last 3 results) No results for input(s): PROBNP in the last 8760 hours. HbA1C: No results for input(s): HGBA1C in the last 72 hours. CBG: Recent Labs  Lab 11/20/19 1633 11/20/19 2044  GLUCAP 131* 185*   Lipid Profile: No results for input(s): CHOL, HDL, LDLCALC, TRIG, CHOLHDL, LDLDIRECT in the last 72 hours. Thyroid Function Tests: No results for input(s): TSH, T4TOTAL, FREET4, T3FREE, THYROIDAB in the last 72 hours. Anemia Panel: Recent Labs    11/20/19 1138  VITAMINB12 254  FERRITIN 4*  TIBC 434  IRON 23*   Sepsis  Labs: Recent Labs  Lab 11/19/19 2012  LATICACIDVEN 0.8    Recent Results (from the past 240 hour(s))  SARS Coronavirus 2 by RT PCR (hospital order, performed in [facility] hospital lab) Nasopharyngeal Nasopharyngeal Swab     Status: None   Collection Time: 11/19/19  6:13 PM   Specimen: Nasopharyngeal Swab  Result Value Ref Range Status   SARS Coronavirus 2 NEGATIVE NEGATIVE Final    Comment: (NOTE) SARS-CoV-2 target nucleic acids are NOT DETECTED.  The SARS-CoV-2 RNA is generally detectable in upper and lower respiratory specimens during the acute phase of infection. The lowest concentration of SARS-CoV-2 viral copies this assay can detect is 250 copies / mL. A negative result does not preclude SARS-CoV-2 infection and should not be used as the sole basis for treatment or other patient management decisions.  A negative result may occur with improper specimen collection / handling, submission of specimen other than nasopharyngeal  swab, presence of viral mutation(s) within the areas targeted by this assay, and inadequate number of viral copies (<250 copies / mL). A negative result must be combined with clinical observations, patient history, and epidemiological information.  Fact Sheet for Patients:   StrictlyIdeas.no  Fact Sheet for Healthcare Providers: BankingDealers.co.za  This test is not yet approved or  cleared by the [place] FDA and has been authorized for detection and/or diagnosis of SARS-CoV-2 by FDA under an Emergency Use Authorization (EUA).  This EUA will remain in effect (meaning this test can be used) for the duration of the COVID-19 declaration under Section 564(b)(1) of the Act, 21 U.S.C. section 360bbb-3(b)(1), unless the authorization is terminated or revoked sooner.  Performed at [facility], [address] [place], [place] [address]          Radiology Studies: DG Chest Portable 1  View  Result Date: 11/19/2019 CLINICAL DATA:  Cough and shortness of breath EXAM: PORTABLE CHEST 1 VIEW COMPARISON:  October 22, 2019 FINDINGS: The heart size and mediastinal contours are unchanged with mild cardiomegaly. Aortic knob calcifications. There is mild prominence of the central pulmonary vasculature. No large airspace consolidation or pleural effusion. No acute osseous abnormality. 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 IMPRESSION: Mild cardiomegaly and pulmonary vascular congestion. Electronically Signed   By: Prudencio Pair M.D.   On: 11/19/2019 19:05        Scheduled Meds: . aspirin EC  81 mg Oral Daily  . budesonide (PULMICORT) nebulizer solution  0.25 mg Nebulization BID  . colchicine  0.6 mg Oral Daily  . enoxaparin (LOVENOX) injection  40 mg Subcutaneous BID  . furosemide  60 mg Intravenous Q12H  . insulin aspart  0-20 Units Subcutaneous TID AC & HS  . insulin glargine  15 Units Subcutaneous QHS  . ipratropium-albuterol  3 mL Nebulization TID  . metoprolol tartrate  25 mg Oral BID  . sodium chloride flush  3 mL Intravenous Q12H   Continuous Infusions: . sodium chloride       LOS: 1 day   Time spent= 35 mins    Reco Shonk Arsenio Loader, MD [facility]  If 7PM-7AM, please contact night-coverage  11/21/2019, 8:18 AM

## 2019-11-21 NOTE — Progress Notes (Signed)
Pt is refusing feraheme and lovenox this AM MD made aware.

## 2019-11-21 NOTE — Progress Notes (Signed)
Pt refusing vitals and sugar check this AM requesting that tech wake her up a 9a and do it then.

## 2019-11-22 DIAGNOSIS — D509 Iron deficiency anemia, unspecified: Secondary | ICD-10-CM

## 2019-11-22 DIAGNOSIS — J9601 Acute respiratory failure with hypoxia: Secondary | ICD-10-CM

## 2019-11-22 DIAGNOSIS — Z794 Long term (current) use of insulin: Secondary | ICD-10-CM

## 2019-11-22 DIAGNOSIS — I10 Essential (primary) hypertension: Secondary | ICD-10-CM

## 2019-11-22 LAB — GLUCOSE, CAPILLARY
Glucose-Capillary: 80 mg/dL (ref 70–99)
Glucose-Capillary: 171 mg/dL — ABNORMAL HIGH (ref 70–99)
Glucose-Capillary: 123 mg/dL — ABNORMAL HIGH (ref 70–99)
Glucose-Capillary: 156 mg/dL — ABNORMAL HIGH (ref 70–99)

## 2019-11-22 LAB — CBC
HCT: 35.7 % — ABNORMAL LOW (ref 36.0–46.0)
Hemoglobin: 10.7 g/dL — ABNORMAL LOW (ref 12.0–15.0)
MCH: 22 pg — ABNORMAL LOW (ref 26.0–34.0)
MCHC: 30 g/dL (ref 30.0–36.0)
MCV: 73.3 fL — ABNORMAL LOW (ref 80.0–100.0)
Platelets: 260 10*3/uL (ref 150–400)
RBC: 4.87 MIL/uL (ref 3.87–5.11)
RDW: 19 % — ABNORMAL HIGH (ref 11.5–15.5)
WBC: 6.9 10*3/uL (ref 4.0–10.5)
nRBC: 0 % (ref 0.0–0.2)

## 2019-11-22 LAB — BASIC METABOLIC PANEL
Anion gap: 12 (ref 5–15)
BUN: 35 mg/dL — ABNORMAL HIGH (ref 8–23)
CO2: 28 mmol/L (ref 22–32)
Calcium: 8.2 mg/dL — ABNORMAL LOW (ref 8.9–10.3)
Chloride: 97 mmol/L — ABNORMAL LOW (ref 98–111)
Creatinine, Ser: 1.17 mg/dL — ABNORMAL HIGH (ref 0.44–1.00)
GFR calc Af Amer: 51 mL/min — ABNORMAL LOW (ref >60)
GFR calc non Af Amer: 44 mL/min — ABNORMAL LOW (ref >60)
Glucose, Bld: 208 mg/dL — ABNORMAL HIGH (ref 70–99)
Potassium: 4.5 mmol/L (ref 3.5–5.1)
Sodium: 137 mmol/L (ref 135–145)

## 2019-11-22 LAB — MAGNESIUM: Magnesium: 2 mg/dL (ref 1.7–2.4)

## 2019-11-22 MED ORDER — FUROSEMIDE 40 MG PO TABS
40.0000 mg | ORAL_TABLET | Freq: Two times a day (BID) | ORAL | Status: DC
  Administered 2019-11-23 (×2): 40 mg via ORAL
  Filled 2019-11-22 (×2): qty 1

## 2019-11-22 MED ORDER — POLYETHYLENE GLYCOL 3350 17 G PO PACK: 17.0000 g | PACK | Freq: Every day | ORAL | Status: DC | PRN

## 2019-11-22 MED ORDER — HYDROCORTISONE (PERIANAL) 2.5 % EX CREA
TOPICAL_CREAM | Freq: Two times a day (BID) | CUTANEOUS | Status: DC
  Filled 2019-11-22: qty 28.35

## 2019-11-22 NOTE — Progress Notes (Signed)
PROGRESS NOTE    Karla Sanchez  OFB:[id number] DOB: Apr 14, 1940 DOA: 11/19/2019 PCP: Alfredia Client, MD   Chief Complaint  Patient presents with  . Shortness of Breath    Brief Narrative:  79 y.o.Caucasian femalewith a known history of diastolic CHF COPD, type 2 diabetes mellitus, gout and hypertension, who presented to the emergency room with 1 of several admissions recently for acute onset of worsening dyspnea.  She was admitted for diastolic CHF exacerbation and hypoxia    Assessment & Plan:   Active Problems:   Acute on chronic diastolic (congestive) heart failure (HCC)   CHF exacerbation (HCC)  #1 acute hypoxic respiratory failure secondary to acute on chronic diastolic CHF exacerbation Patient presented worsening shortness of breath, chest x-ray concerning for acute CHF exacerbation.  Patient placed on IV Lasix with clinical improvement.  Urine output of 800 cc recorded over the past 24 hours.  Weight not recorded today.  Continue IV Lasix through today and transition to oral dose Lasix tomorrow.  Continue Lopressor.  Strict I's and O's.  Daily weights.  Follow.  2.  Iron deficiency anemia/history of colon adenocarcinoma??  Status post colectomy?? Patient noted to have had a colonoscopy January 2021 that showed internal hemorrhoids, previous history of colon mass adenocarcinoma.  IV Feraheme was ordered patient initially refused however agreed to take it and received IV Feraheme 11/21/2019.  Patient is to follow-up with [facility], Dr. Reesa Chew discussed extensively with patient and she would like to resume her care at [facility] upon discharge.  Will likely need oral iron supplementations and bowel regimen on discharge.  Follow H&H.  3.  History of COPD with very mild exacerbation Stable.  Clinical improvement.  No need for steroids.  Continue Pulmicort twice daily, scheduled duo nebs.  Follow.  4.  Diabetes mellitus type II Hemoglobin A1c 8.0 (10/03/2019).  CBG of  80 this morning.  Continue current regimen of Lantus, sliding scale insulin.  Follow.  5.  History of gout Continue allopurinol and colchicine.  6.  Hypertension Continue Lopressor.   DVT prophylaxis: Lovenox Code Status: Full Family Communication: Updated patient.  No family at bedside. Disposition:   Status is: Inpatient    Dispo: The patient is from: Home              Anticipated d/c is to: SNF              Anticipated d/c date is: 2 to 3 days.              Patient currently on IV Lasix, being diuresed, not stable for discharge.       Consultants:   None  Procedures:   Chest x-ray 11/19/2019    Antimicrobials:   None   Subjective: Patient sitting up in chair.  States shortness of breath has improved since admission.  Denies any chest pain.  Complaining of some rectal burning as she stated she had a large bowel movement today and does have a history of internal hemorrhoids.  Objective: Vitals:   11/21/19 1700 11/22/19 0014 11/22/19 0734 11/22/19 0759  BP: (!) 118/38 (!) 117/46 (!) 113/50   Pulse: 61 (!) 55 69   Resp: 18 18 18    Temp: 97.6 F (36.4 C) 98.2 F (36.8 C) 98.2 F (36.8 C)   TempSrc: Oral  Oral   SpO2: 97% 98% 98% 98%  Weight:      Height:        Intake/Output Summary (Last 24 hours) at  11/22/2019 1250 Last data filed at 11/22/2019 1100 Gross per 24 hour  Intake 113.55 ml  Output --  Net 113.55 ml   Filed Weights   11/19/19 1858  Weight: 117 kg    Examination:  General exam: NAD Respiratory system: Decreased breath sounds in the bases.  Some scattered crackles.  Fair air movement.  Speaking in full sentences.   Cardiovascular system: S1 & S2 heard, RRR. No JVD, murmurs, rubs, gallops or clicks.  1-2+ bilateral lower extremity edema.  Gastrointestinal system: Abdomen is nondistended, soft and nontender. No organomegaly or masses felt. Normal bowel sounds heard. Central nervous system: Alert and oriented. No focal neurological  deficits. Extremities: Symmetric 5 x 5 power. Skin: No rashes, lesions or ulcers Psychiatry: Judgement and insight appear normal. Mood & affect appropriate.     Data Reviewed: I have personally reviewed following labs and imaging studies  CBC: Recent Labs  Lab 11/19/19 2013 11/20/19 1138 11/21/19 0911 11/22/19 0911  WBC 8.0  --  7.0 6.9  NEUTROABS 4.4  --   --   --   HGB 10.8*  --  10.5* 10.7*  HCT 36.6 36.4 37.0 35.7*  MCV 73.8*  --  75.4* 73.3*  PLT 258  --  258 562    Basic Metabolic Panel: Recent Labs  Lab 11/19/19 2013 11/20/19 1138 11/21/19 0911 11/22/19 0911  NA 141 141 139 137  K 4.5 4.6 4.8 4.5  CL 103 103 101 97*  CO2 30 30 30 28   GLUCOSE 91 171* 121* 208*  BUN 35* 31* 29* 35*  CREATININE 0.99 0.87 0.96 1.17*  CALCIUM 8.4* 8.2* 8.4* 8.2*  MG  --   --  2.0 2.0    GFR: Estimated Creatinine Clearance: 50.7 mL/min (A) (by C-G formula based on SCr of 1.17 mg/dL (H)).  Liver Function Tests: Recent Labs  Lab 11/19/19 2013  AST 11*  ALT 9  ALKPHOS 96  BILITOT 0.6  PROT 6.3*  ALBUMIN 3.1*    CBG: Recent Labs  Lab 11/21/19 1153 11/21/19 1701 11/21/19 2142 11/22/19 0736 11/22/19 1139  GLUCAP 143* 95 131* 80 171*     Recent Results (from the past 240 hour(s))  SARS Coronavirus 2 by RT PCR (hospital order, performed in [facility] hospital lab) Nasopharyngeal Nasopharyngeal Swab     Status: None   Collection Time: 11/19/19  6:13 PM   Specimen: Nasopharyngeal Swab  Result Value Ref Range Status   SARS Coronavirus 2 NEGATIVE NEGATIVE Final    Comment: (NOTE) SARS-CoV-2 target nucleic acids are NOT DETECTED.  The SARS-CoV-2 RNA is generally detectable in upper and lower respiratory specimens during the acute phase of infection. The lowest concentration of SARS-CoV-2 viral copies this assay can detect is 250 copies / mL. A negative result does not preclude SARS-CoV-2 infection and should not be used as the sole basis for treatment or  other patient management decisions.  A negative result may occur with improper specimen collection / handling, submission of specimen other than nasopharyngeal swab, presence of viral mutation(s) within the areas targeted by this assay, and inadequate number of viral copies (<250 copies / mL). A negative result must be combined with clinical observations, patient history, and epidemiological information.  Fact Sheet for Patients:   StrictlyIdeas.no  Fact Sheet for Healthcare Providers: BankingDealers.co.za  This test is not yet approved or  cleared by the [place] FDA and has been authorized for detection and/or diagnosis of SARS-CoV-2 by FDA under an Emergency Use Authorization (  EUA).  This EUA will remain in effect (meaning this test can be used) for the duration of the COVID-19 declaration under Section 564(b)(1) of the Act, 21 U.S.C. section 360bbb-3(b)(1), unless the authorization is terminated or revoked sooner.  Performed at [facility], [address] [place], [place] [address]          Radiology Studies: No results found.      Scheduled Meds: . aspirin EC  81 mg Oral Daily  . budesonide (PULMICORT) nebulizer solution  0.25 mg Nebulization BID  . colchicine  0.6 mg Oral Daily  . enoxaparin (LOVENOX) injection  40 mg Subcutaneous BID  . furosemide  60 mg Intravenous Q12H  . insulin aspart  0-20 Units Subcutaneous TID AC & HS  . insulin glargine  15 Units Subcutaneous QHS  . ipratropium-albuterol  3 mL Nebulization BID  . metoprolol tartrate  25 mg Oral BID  . sodium chloride flush  3 mL Intravenous Q12H   Continuous Infusions: . sodium chloride 250 mL (11/21/19 1033)     LOS: 2 days    Time spent: 35 minutes    Irine Seal, MD [facility]   To contact the attending provider between 7A-7P or the covering provider during after hours 7P-7A, please log into the web site  [email or web address] and access using universal [place] password for that web site. If you do not have the password, please call the hospital operator.  11/22/2019, 12:50 PM

## 2019-11-23 DIAGNOSIS — I509 Heart failure, unspecified: Secondary | ICD-10-CM

## 2019-11-23 LAB — GLUCOSE, CAPILLARY
Glucose-Capillary: 114 mg/dL — ABNORMAL HIGH (ref 70–99)
Glucose-Capillary: 117 mg/dL — ABNORMAL HIGH (ref 70–99)
Glucose-Capillary: 131 mg/dL — ABNORMAL HIGH (ref 70–99)

## 2019-11-23 MED ORDER — HYDROCORTISONE (PERIANAL) 2.5 % EX CREA: TOPICAL_CREAM | Freq: Two times a day (BID) | CUTANEOUS | 0 refills | Status: DC

## 2019-11-23 MED ORDER — BENZONATATE 200 MG PO CAPS: 200.0000 mg | ORAL_CAPSULE | Freq: Three times a day (TID) | ORAL | 0 refills | Status: DC | PRN

## 2019-11-23 MED ORDER — POLYETHYLENE GLYCOL 3350 17 G PO PACK: 17.0000 g | PACK | Freq: Every day | ORAL | 0 refills | Status: DC | PRN

## 2019-11-23 NOTE — Progress Notes (Signed)
PT Cancellation Note  Patient Details Name: Karla Sanchez MRN: [id number] DOB: [birth year]   Cancelled Treatment:    Reason Eval/Treat Not Completed: Other (comment). Pt up in chair, declined further mobility at this time. PT and pt briefly discussed options at discharge, pt reported wanting to go to home [place] at discharge. Pt began to cough so further conversation about staying with friends or considering a rehab stay was cut short. PT to re-attempt as able.   Lieutenant Diego PT, DPT 11:28 AM,11/23/19

## 2019-11-23 NOTE — TOC Progression Note (Addendum)
Transition of Care [facility]) - Progression Note    Patient Details  Name: Karla Sanchez MRN: [id number] Date of Birth: [birth year]  [facility]) CM/SW [place], RN Phone Number: 11/23/2019, 10:17 AM  Clinical Narrative:   Went into to room to speak with the patient about discharge plan and needs, the lab was in her room to draw blood and she refused, she told them she did not want blood drawn right now, they left the room, I asked if she would speak to me and explained that I was with TOC and a case manager and my role to help with discharge plans and needs, she told me that she was not going to talk to anyone right now and asked that I leave the room, I asked her when would be a good time to come back to speak to her and she said she did not know.  I left the room and will return later to hopefully formulate a DC plan with the patient    Expected Discharge Plan: [place] Barriers to Discharge: Continued Medical Work up  Expected Discharge Plan and Services Expected Discharge Plan: [place] In-house Referral: Clinical Social Work     Living arrangements for the past 2 months: Hotel/Motel                                       Social Determinants of Health (SDOH) Interventions    Readmission Risk Interventions Readmission Risk Prevention Plan 10/24/2019  Medication Review (RN Care Manager) Complete  HRI or [place] Patient Refused

## 2019-11-23 NOTE — Evaluation (Signed)
Occupational Therapy Evaluation Patient Details Name: Karla Sanchez MRN: [id number] DOB: 17-Dec-1940 Today's Date: 11/23/2019    History of Present Illness 79 y.o. Caucasian female with a known history of diastolic CHF COPD, type 2 diabetes mellitus, gout and hypertension, who presented to the emergency room with 1 of several admissions recently for acute onset of worsening dyspnea.    She was admitted for diastolic CHF exacerbation and hypoxia   Clinical Impression   Patient presenting with decreased I in self care,balance, functional mobility/transfers, endurance, strength, and safety awareness. Patient reports being mod I with all self care and transfers happening from wheelchair with stand pivot only PTA. Patient currently functioning at set up A for grooming and UB self care. Pt refused LB self care tasks and functional transfers. Pt has been living in a hotel and when asked about discharge disposition pt report she was not going back to hotel and would be going to her home in [place]. When therapist asked further about that she verbalized she could not speak to me further because of her "coughing episodes". OT expressed concern that she may need further DME and she refused. Therefore, OT recommendation for SNF at discharge for functional deficits and safety. Patient will benefit from acute OT to increase overall independence in the areas of ADLs, functional mobility, safety awareness in order to safely discharge to next venue of care.   Follow Up Recommendations  SNF    Equipment Recommendations  Other (comment) (pt refuses equipment when discussed)    Recommendations for Other Services Other (comment) (none at this time)     Precautions / Restrictions Precautions Precautions: Fall      Mobility Bed Mobility      General bed mobility comments: seated in recliner chair at end of session  Transfers      General transfer comment: Pt refusa to participate in OT assessment of  functional transfers    Balance Overall balance assessment: Needs assistance Sitting-balance support: Feet supported Sitting balance-Leahy Scale: Good        ADL either performed or assessed with clinical judgement   ADL Overall ADL's : Needs assistance/impaired      General ADL Comments: Pt demonstrates donning B shoes with increased time and UB ADLs with set up A. Pt refusing functional transfer this session. She endorses sitting in w/c to perform all self care at baseline.     Vision Baseline Vision/History: Wears glasses Wears Glasses: At all times Patient Visual Report: No change from baseline              Pertinent Vitals/Pain Pain Assessment: Faces Faces Pain Scale: Hurts little more Pain Location: under R breast Pain Descriptors / Indicators: Tender;Other (Comment) (itching) Pain Intervention(s): Limited activity within patient's tolerance;Monitored during session     Hand Dominance Right   Extremity/Trunk Assessment Upper Extremity Assessment Upper Extremity Assessment: Generalized weakness   Lower Extremity Assessment Lower Extremity Assessment: Defer to PT evaluation   Cervical / Trunk Assessment Cervical / Trunk Assessment: Kyphotic   Communication Communication Communication: No difficulties   Cognition Arousal/Alertness: Awake/alert Behavior During Therapy: WFL for tasks assessed/performed Overall Cognitive Status: Within Functional Limits for tasks assessed                     Home Living Family/patient expects to be discharged to:: Other (Comment)      Additional Comments: living in hotel but reports she plans on going to her home in [place]      Prior  Functioning/Environment Level of Independence: Needs assistance  Gait / Transfers Assistance Needed: Uses heels of B feet to propel self in manual w/c (doesn't use hands to propel w/c d/t h/o hand issues); has power w/c but is in the "shop" and prefers to use manual w/c. ADL's /  Homemaking Assistance Needed: Endorses she is independent with bathing, dressing, toileting with increased time   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 and can only take 2 steps for transfers; does not walk d/t this and does not stand for any length of time d/t this--pt reports it will "crush" the bones in her feet)        OT Problem List: Decreased strength;Decreased activity tolerance;Pain      OT Treatment/Interventions: Self-care/ADL training;Therapeutic exercise;DME and/or AE instruction;Therapeutic activities;Balance training;Patient/family education;Energy conservation    OT Goals(Current goals can be found in the care plan section) Acute Rehab OT Goals Patient Stated Goal: return to [place] OT Goal Formulation: With patient Time For Goal Achievement: 12/07/19 Potential to Achieve Goals: Good ADL Goals Pt Will Perform Grooming: with modified independence Pt Will Transfer to Toilet: with modified independence;bedside commode Pt Will Perform Toileting - Clothing Manipulation and hygiene: with modified independence;sit to/from stand  OT Frequency: Min 1X/week   Barriers to D/C: Other (comment)  Pt living alone in hotel. She reports she plans on going to her home in [place] but unable to provide information          AM-PAC OT "6 Clicks" Daily Activity     Outcome Measure Help from another person eating meals?: None Help from another person taking care of personal grooming?: A Little Help from another person toileting, which includes using toliet, bedpan, or urinal?: A Little Help from another person bathing (including washing, rinsing, drying)?: A Little Help from another person to put on and taking off regular upper body clothing?: None Help from another person to put on and taking off regular lower body clothing?: A Little 6 Click Score: 20    End of Session Equipment Utilized During Treatment: Oxygen Nurse Communication: Mobility status;Other (comment) (itching under breast)  Activity Tolerance: Patient tolerated treatment well Patient left: in chair;with call bell/phone within reach  OT Visit Diagnosis: Unsteadiness on feet (R26.81);Muscle weakness (generalized) (M62.81)                Time: 2334-3568 OT Time Calculation (min): 13 min Charges:  OT General Charges $OT Visit: 1 Visit OT Evaluation $OT Eval Low Complexity: 1 Low  Darleen Crocker, MS, OTR/L , CBIS ascom [phone]  11/23/19, 3:01 PM

## 2019-11-23 NOTE — Care Management Important Message (Signed)
Important Message  Patient Details  Name: Rether Rison MRN: [id number] Date of Birth: [birth year]   Medicare Important Message Given:  Yes     Dannette Barbara 11/23/2019, 1:48 PM

## 2019-11-23 NOTE — Progress Notes (Signed)
PROGRESS NOTE    Karla Sanchez  WGY:[id number] DOB: [birth year] DOA: 11/19/2019 PCP: Karla Client, MD   Chief Complaint  Patient presents with   Shortness of Breath    Brief Narrative:  79 y.o.Caucasian femalewith a known history of diastolic CHF COPD, type 2 diabetes mellitus, gout and hypertension, who presented to the emergency room with 1 of several admissions recently for acute onset of worsening dyspnea.  She was admitted for diastolic CHF exacerbation and hypoxia    Assessment & Plan:   Active Problems:   Acute on chronic diastolic (congestive) heart failure (HCC)   CHF exacerbation (HCC)   Acute respiratory failure with hypoxia (HCC)   Iron deficiency anemia   Type 2 diabetes mellitus without complication, with long-term current use of insulin (HCC)  #1 acute hypoxic respiratory failure secondary to acute on chronic diastolic CHF exacerbation Patient presented worsening shortness of breath, chest x-ray concerning for acute CHF exacerbation.  Patient placed on IV Lasix with clinical improvement.  Continue Lasix 40 mg p.o. twice daily, continue Lopressor.  Strict I's and O's.  Daily weights.  Follow.  2.  Iron deficiency anemia/history of colon adenocarcinoma??  Status post colectomy?? Patient noted to have had a colonoscopy January 2021 that showed internal hemorrhoids, previous history of colon mass adenocarcinoma.  IV Feraheme was ordered patient initially refused however agreed to take it and received IV Feraheme 11/21/2019.  Patient is to follow-up with [facility], Dr. Reesa Chew discussed extensively with patient and she would like to resume her care at [facility] upon discharge.  Will likely need oral iron supplementations and bowel regimen on discharge.  Follow H&H.  3.  History of COPD with very mild exacerbation Stable.  Clinical improvement.  No need for steroids.  Continue Pulmicort twice daily, scheduled duo nebs.  Follow.  4.  Diabetes mellitus type  II Hemoglobin A1c 8.0 (10/03/2019).  Continue current regimen of Lantus, sliding scale insulin.  Follow.  5.  History of gout Continue allopurinol and colchicine.  6.  Hypertension Continue Lopressor.   DVT prophylaxis: Lovenox Code Status: Full Family Communication: Updated patient.  No family at bedside. Disposition:   Status is: Inpatient    Dispo: The patient is from: Home              Anticipated d/c is to: SNF              Anticipated d/c date is: 1-2 days.             PT/OT recommend SNF, TOC working on placement, patient would like to go to SNF in [place] area      Consultants:   None  Procedures:   Chest x-ray 11/19/2019    Antimicrobials:   None   Subjective: Shortness of breath, weak.  Interested to go around [place] area for SNF if possible  Objective: Vitals:   11/22/19 1719 11/22/19 2332 11/23/19 0500 11/23/19 0832  BP: (!) 155/92 (!) 128/46  (!) 111/40  Pulse: 61 63  63  Resp: 18 20  18   Temp: 97.6 F (36.4 C) 98.4 F (36.9 C)  98.1 F (36.7 C)  TempSrc: Oral Oral    SpO2: 100% 95%  94%  Weight:   112.1 kg   Height:        Intake/Output Summary (Last 24 hours) at 11/23/2019 1540 Last data filed at 11/23/2019 1348 Gross per 24 hour  Intake 720 ml  Output --  Net 720 ml   [employer]  11/19/19 1858 11/23/19 0500  Weight: 117 kg 112.1 kg    Examination:  General exam: NAD Respiratory system: Decreased breath sounds in the bases.  Some scattered crackles.  Fair air movement.  Speaking in full sentences.   Cardiovascular system: S1 & S2 heard, RRR. No JVD, murmurs, rubs, gallops or clicks.  1-2+ bilateral lower extremity edema.  Gastrointestinal system: Abdomen is nondistended, soft and nontender. No organomegaly or masses felt. Normal bowel sounds heard. Central nervous system: Alert and oriented. No focal neurological deficits. Extremities: Symmetric 5 x 5 power. Skin: No rashes, lesions or ulcers Psychiatry: Judgement and  insight appear normal. Mood & affect appropriate.     Data Reviewed: I have personally reviewed following labs and imaging studies  CBC: Recent Labs  Lab 11/19/19 2013 11/20/19 1138 11/21/19 0911 11/22/19 0911  WBC 8.0  --  7.0 6.9  NEUTROABS 4.4  --   --   --   HGB 10.8*  --  10.5* 10.7*  HCT 36.6 36.4 37.0 35.7*  MCV 73.8*  --  75.4* 73.3*  PLT 258  --  258 242    Basic Metabolic Panel: Recent Labs  Lab 11/19/19 2013 11/20/19 1138 11/21/19 0911 11/22/19 0911  NA 141 141 139 137  K 4.5 4.6 4.8 4.5  CL 103 103 101 97*  CO2 30 30 30 28   GLUCOSE 91 171* 121* 208*  BUN 35* 31* 29* 35*  CREATININE 0.99 0.87 0.96 1.17*  CALCIUM 8.4* 8.2* 8.4* 8.2*  MG  --   --  2.0 2.0    GFR: Estimated Creatinine Clearance: 49.5 mL/min (A) (by C-G formula based on SCr of 1.17 mg/dL (H)).  Liver Function Tests: Recent Labs  Lab 11/19/19 2013  AST 11*  ALT 9  ALKPHOS 96  BILITOT 0.6  PROT 6.3*  ALBUMIN 3.1*    CBG: Recent Labs  Lab 11/22/19 0736 11/22/19 1139 11/22/19 1721 11/22/19 2210 11/23/19 0833  GLUCAP 80 171* 123* 156* 114*     Recent Results (from the past 240 hour(s))  SARS Coronavirus 2 by RT PCR (hospital order, performed in [facility] hospital lab) Nasopharyngeal Nasopharyngeal Swab     Status: None   Collection Time: 11/19/19  6:13 PM   Specimen: Nasopharyngeal Swab  Result Value Ref Range Status   SARS Coronavirus 2 NEGATIVE NEGATIVE Final    Comment: (NOTE) SARS-CoV-2 target nucleic acids are NOT DETECTED.  The SARS-CoV-2 RNA is generally detectable in upper and lower respiratory specimens during the acute phase of infection. The lowest concentration of SARS-CoV-2 viral copies this assay can detect is 250 copies / mL. A negative result does not preclude SARS-CoV-2 infection and should not be used as the sole basis for treatment or other patient management decisions.  A negative result may occur with improper specimen collection / handling,  submission of specimen other than nasopharyngeal swab, presence of viral mutation(s) within the areas targeted by this assay, and inadequate number of viral copies (<250 copies / mL). A negative result must be combined with clinical observations, patient history, and epidemiological information.  Fact Sheet for Patients:   StrictlyIdeas.no  Fact Sheet for Healthcare Providers: BankingDealers.co.za  This test is not yet approved or  cleared by the [place] FDA and has been authorized for detection and/or diagnosis of SARS-CoV-2 by FDA under an Emergency Use Authorization (EUA).  This EUA will remain in effect (meaning this test can be used) for the duration of the COVID-19 declaration under Section 564(b)(1) of  the Act, 21 U.S.C. section 360bbb-3(b)(1), unless the authorization is terminated or revoked sooner.  Performed at [facility], [address], [place], [place] [address]          Radiology Studies: No results found.      Scheduled Meds:  aspirin EC  81 mg Oral Daily   colchicine  0.6 mg Oral Daily   enoxaparin (LOVENOX) injection  40 mg Subcutaneous BID   furosemide  40 mg Oral BID   hydrocortisone   Rectal BID   insulin aspart  0-20 Units Subcutaneous TID AC & HS   insulin glargine  15 Units Subcutaneous QHS   metoprolol tartrate  25 mg Oral BID   sodium chloride flush  3 mL Intravenous Q12H   Continuous Infusions:  sodium chloride 250 mL (11/21/19 1033)     LOS: 3 days    Time spent: 35 minutes    Kaitlin Alcindor Manuella Ghazi, MD [facility]   To contact the attending provider between 7A-7P or the covering provider during after hours 7P-7A, please log into the web site [email or web address] and access using universal [place] password for that web site. If you do not have the password, please call the hospital operator.  11/23/2019, 3:40 PM

## 2019-11-24 NOTE — TOC Progression Note (Signed)
Transition of Care [facility]) - Progression Note    Patient Details  Name: Karla Sanchez MRN: [id number] Date of Birth: 1940/04/09  Transition of Care [facility]) CM/SW Contact  Su Hilt, RN Phone Number: 11/24/2019, 9:58 AM  Clinical Narrative:   Spoke with the patient to determine her DC plan, She has Oxygen Tank at the [facility] room 114 She plans to return to the [place] room 834, she has a wheelchair there and is wheelchair bound, she wants to use EMS to get back to the [employer] I Offered to set up a wheelchair [name] and she declined and said it needs to be  EMS    Expected Discharge Plan: [place] Barriers to Discharge: Continued Medical Work up  Expected Discharge Plan and Services Expected Discharge Plan: [place] In-house Referral: Clinical Social Work     Living arrangements for the past 2 months: Hotel/Motel Expected Discharge Date: 11/24/19                                     Social Determinants of Health (SDOH) Interventions    Readmission Risk Interventions Readmission Risk Prevention Plan 10/24/2019  Medication Review (RN Care Manager) Complete  HRI or [place] Patient Refused

## 2019-11-24 NOTE — Discharge Instructions (Signed)
Heart Failure, Self Care Heart failure is a serious condition. This sheet explains things you need to do to take care of yourself at home. To help you stay as healthy as possible, you may be asked to change your diet, take certain medicines, and make other changes in your life. Your doctor may also give you more specific instructions. If you have problems or questions, call your doctor. What are the risks? Having heart failure makes it more likely for you to have some problems. These problems can get worse if you do not take good care of yourself. Problems may include:  Blood clotting problems. This may cause a stroke.  Damage to the kidneys, liver, or lungs.  Abnormal heart rhythms. Supplies needed:  Scale for weighing yourself.  Blood pressure monitor.  Notebook.  Medicines. How to care for yourself when you have heart failure Medicines Take over-the-counter and prescription medicines only as told by your doctor. Take your medicines every day.  Do not stop taking your medicine unless your doctor tells you to do so.  Do not skip any medicines.  Get your prescriptions refilled before you run out of medicine. This is important. Eating and drinking   Eat heart-healthy foods. Talk with a diet specialist (dietitian) to create an eating plan.  Choose foods that: ? Have no trans fat. ? Are low in saturated fat and cholesterol.  Choose healthy foods, such as: ? Fresh or frozen fruits and vegetables. ? Fish. ? Low-fat (lean) meats. ? Legumes, such as beans, peas, and lentils. ? Fat-free or low-fat dairy products. ? Whole-grain foods. ? High-fiber foods.  Limit salt (sodium) if told by your doctor. Ask your diet specialist to tell you which seasonings are healthy for your heart.  Cook in healthy ways instead of frying. Healthy ways of cooking include roasting, grilling, broiling, baking, poaching, steaming, and stir-frying.  Limit how much fluid you drink, if told by your  doctor. Alcohol use  Do not drink alcohol if: ? Your doctor tells you not to drink. ? Your heart was damaged by alcohol, or you have very bad heart failure. ? You are pregnant, may be pregnant, or are planning to become pregnant.  If you drink alcohol: ? Limit how much you use to:  0-1 drink a day for women.  0-2 drinks a day for men. ? Be aware of how much alcohol is in your drink. In the [place] one drink equals one 12 oz bottle of beer (355 mL), one 5 oz glass of wine (148 mL), or one 1 oz glass of hard liquor (44 mL). Lifestyle   Do not use any products that contain nicotine or tobacco, such as cigarettes, e-cigarettes, and chewing tobacco. If you need help quitting, ask your doctor. ? Do not use nicotine gum or patches before talking to your doctor.  Do not use illegal drugs.  Lose weight if told by your doctor.  Do physical activity if told by your doctor. Talk to your doctor before you begin an exercise if: ? You are an older adult. ? You have very bad heart failure.  Learn to manage stress. If you need help, ask your doctor.  Get rehab (rehabilitation) to help you stay independent and to help with your quality of life.  Plan time to rest when you get tired. Check weight and blood pressure   Weigh yourself every day. This will help you to know if fluid is building up in your body. ? Weigh yourself every  morning after you pee (urinate) and before you eat breakfast. ? Wear the same amount of clothing each time. ? Write down your daily weight. Give your record to your doctor.  Check and write down your blood pressure as told by your doctor.  Check your pulse as told by your doctor. Dealing with very hot and very cold weather  If it is very hot: ? Avoid activities that take a lot of energy. ? Use air conditioning or fans, or find a cooler place. ? Avoid caffeine and alcohol. ? Wear clothing that is loose-fitting, lightweight, and light-colored.  If it is very  cold: ? Avoid activities that take a lot of energy. ? Layer your clothes. ? Wear mittens or gloves, a hat, and a scarf when you go outside. ? Avoid alcohol. Follow these instructions at home:  Stay up to date with shots (vaccines). Get pneumococcal and flu (influenza) shots.  Keep all follow-up visits as told by your doctor. This is important. Contact a doctor if:  You gain weight quickly.  You have increasing shortness of breath.  You cannot do your normal activities.  You get tired easily.  You cough a lot.  You don't feel like eating or feel like you may vomit (nauseous).  You become puffy (swell) in your hands, feet, ankles, or belly (abdomen).  You cannot sleep well because it is hard to breathe.  You feel like your heart is beating fast (palpitations).  You get dizzy when you stand up. Get help right away if:  You have trouble breathing.  You or someone else notices a change in your behavior, such as having trouble staying awake.  You have chest pain or discomfort.  You pass out (faint). These symptoms may be an emergency. Do not wait to see if the symptoms will go away. Get medical help right away. Call your local emergency services (911 in the [place].S.). Do not drive yourself to the hospital. Summary  Heart failure is a serious condition. To care for yourself, you may have to change your diet, take medicines, and make other lifestyle changes.  Take your medicines every day. Do not stop taking them unless your doctor tells you to do so.  Eat heart-healthy foods, such as fresh or frozen fruits and vegetables, fish, lean meats, legumes, fat-free or low-fat dairy products, and whole-grain or high-fiber foods.  Ask your doctor if you can drink alcohol. You may have to stop alcohol use if you have very bad heart failure.  Contact your doctor if you gain weight quickly or feel that your heart is beating too fast. Get help right away if you pass out, or have chest pain  or trouble breathing. This information is not intended to replace advice given to you by your health care provider. Make sure you discuss any questions you have with your health care provider. Document Revised: 06/21/2018 Document Reviewed: 06/22/2018 Elsevier Patient Education  2020 [employer]. Return to the ER immediately for new, worsening, or recurrent shortness of breath, chest pain, fever, weakness, or any other new or worsening symptoms that concern you.  You may return to the ER at anytime if you change your mind and wish to resume your care.

## 2019-11-24 NOTE — Progress Notes (Signed)
PT Cancellation Note  Patient Details Name: Karla Sanchez MRN: [id number] DOB: [birth year]   Cancelled Treatment:    Reason Eval/Treat Not Completed: Other (comment). Pt up in chair eating breakfast. Pt politely declined PT, reported she is going to go home, she feels back to normal. Denied difficulty or need for assistance when transferring to chair this AM. Pt also does not want any home health physical therapy. She stated she is going back to her hotel, CM notified. PT to sign off.   Lieutenant Diego PT, DPT 9:05 AM,11/24/19

## 2019-11-24 NOTE — Progress Notes (Signed)
Patient discharging via EMS transport at this time. Patient sent with her personal belongings. Discharge instructions discussed with patient and prescriptions provided as written. Patient verbalized understanding of information.   Refused care this shift with the exception of PRN Tessalon for her cough. Refused assessment, labs, vitals, glucose.

## 2019-11-24 NOTE — TOC Progression Note (Signed)
Transition of Care [facility]) - Progression Note    Patient Details  Name: Karla Sanchez MRN: [id number] Date of Birth: 1941-01-19  Transition of Care [facility]) CM/SW Contact  Su Hilt, RN Phone Number: 11/24/2019, 11:08 AM  Clinical Narrative:   Called EMS [facility] to transport the patient to [place] room 114, [place], I provided that the patient is on continuos O2 and wheelchair bound, they will be here at around 1215 to transport the patient, The bedside nurse is aware    Expected Discharge Plan: [place] Barriers to Discharge: Continued Medical Work up  Expected Discharge Plan and Services Expected Discharge Plan: [place] In-house Referral: Clinical Social Work     Living arrangements for the past 2 months: Hotel/Motel Expected Discharge Date: 11/24/19                                     Social Determinants of Health (SDOH) Interventions    Readmission Risk Interventions Readmission Risk Prevention Plan 10/24/2019  Medication Review (RN Care Manager) Complete  HRI or [place] Patient Refused

## 2019-11-25 ENCOUNTER — Emergency Department: Payer: Medicare Other

## 2019-11-25 ENCOUNTER — Other Ambulatory Visit: Payer: Self-pay

## 2019-11-25 ENCOUNTER — Encounter: Payer: Self-pay | Admitting: Emergency Medicine

## 2019-11-25 DIAGNOSIS — N1832 Chronic kidney disease, stage 3b: Secondary | ICD-10-CM | POA: Insufficient documentation

## 2019-11-25 DIAGNOSIS — I130 Hypertensive heart and chronic kidney disease with heart failure and stage 1 through stage 4 chronic kidney disease, or unspecified chronic kidney disease: Secondary | ICD-10-CM | POA: Diagnosis not present

## 2019-11-25 DIAGNOSIS — Z7982 Long term (current) use of aspirin: Secondary | ICD-10-CM | POA: Diagnosis not present

## 2019-11-25 DIAGNOSIS — E1122 Type 2 diabetes mellitus with diabetic chronic kidney disease: Secondary | ICD-10-CM | POA: Insufficient documentation

## 2019-11-25 DIAGNOSIS — Z794 Long term (current) use of insulin: Secondary | ICD-10-CM | POA: Insufficient documentation

## 2019-11-25 DIAGNOSIS — J449 Chronic obstructive pulmonary disease, unspecified: Secondary | ICD-10-CM | POA: Diagnosis not present

## 2019-11-25 DIAGNOSIS — J9611 Chronic respiratory failure with hypoxia: Secondary | ICD-10-CM | POA: Diagnosis not present

## 2019-11-25 DIAGNOSIS — I5033 Acute on chronic diastolic (congestive) heart failure: Secondary | ICD-10-CM | POA: Diagnosis not present

## 2019-11-25 DIAGNOSIS — Z79899 Other long term (current) drug therapy: Secondary | ICD-10-CM | POA: Insufficient documentation

## 2019-11-25 DIAGNOSIS — R0602 Shortness of breath: Secondary | ICD-10-CM | POA: Diagnosis present

## 2019-11-25 LAB — BASIC METABOLIC PANEL
Anion gap: 9 (ref 5–15)
BUN: 31 mg/dL — ABNORMAL HIGH (ref 8–23)
CO2: 32 mmol/L (ref 22–32)
Calcium: 9 mg/dL (ref 8.9–10.3)
Chloride: 100 mmol/L (ref 98–111)
Creatinine, Ser: 0.9 mg/dL (ref 0.44–1.00)
GFR calc Af Amer: >60 mL/min (ref >60)
GFR calc non Af Amer: >60 mL/min (ref >60)
Glucose, Bld: 122 mg/dL — ABNORMAL HIGH (ref 70–99)
Potassium: 4.8 mmol/L (ref 3.5–5.1)
Sodium: 141 mmol/L (ref 135–145)

## 2019-11-25 LAB — CBC
HCT: 38.3 % (ref 36.0–46.0)
Hemoglobin: 11 g/dL — ABNORMAL LOW (ref 12.0–15.0)
MCH: 21.9 pg — ABNORMAL LOW (ref 26.0–34.0)
MCHC: 28.7 g/dL — ABNORMAL LOW (ref 30.0–36.0)
MCV: 76.1 fL — ABNORMAL LOW (ref 80.0–100.0)
Platelets: 299 10*3/uL (ref 150–400)
RBC: 5.03 MIL/uL (ref 3.87–5.11)
RDW: 19.8 % — ABNORMAL HIGH (ref 11.5–15.5)
WBC: 7.3 10*3/uL (ref 4.0–10.5)
nRBC: 0 % (ref 0.0–0.2)

## 2019-11-25 LAB — BRAIN NATRIURETIC PEPTIDE: B Natriuretic Peptide: 81.8 pg/mL (ref 0.0–100.0)

## 2019-11-25 LAB — TROPONIN I (HIGH SENSITIVITY): Troponin I (High Sensitivity): 13 ng/L (ref <18)

## 2019-11-25 NOTE — ED Triage Notes (Signed)
Patient arrived by EMS from [facility] for SOB. Patient reports her O2 tank ran out. Patient resides at this [facility]. Baseline is stand and pivot

## 2019-11-25 NOTE — ED Notes (Signed)
Changed out pts O2 tank

## 2019-11-25 NOTE — ED Triage Notes (Signed)
Pt was recently admitted for CHF and discharged home 2 days ago.  Was only brought one O2 tank and it ran out. Has been having increased SHOB and some chest tightness.  Breathing has improved since placed on O2 but still feels more SHOB than normal.  Unlabored at this time. VSS.   + orthopnea

## 2019-11-26 ENCOUNTER — Emergency Department
Admission: EM | Admit: 2019-11-26 | Discharge: 2019-11-26 | Discharge status: Against Medical Advice | Payer: Medicare Other | Attending: Emergency Medicine | Admitting: Emergency Medicine

## 2019-11-26 DIAGNOSIS — J9611 Chronic respiratory failure with hypoxia: Secondary | ICD-10-CM | POA: Diagnosis not present

## 2019-11-26 DIAGNOSIS — I5032 Chronic diastolic (congestive) heart failure: Secondary | ICD-10-CM

## 2019-11-26 LAB — TROPONIN I (HIGH SENSITIVITY): Troponin I (High Sensitivity): 12 ng/L (ref <18)

## 2019-11-26 MED ORDER — BENZONATATE 100 MG PO CAPS: 200.0000 mg | ORAL_CAPSULE | Freq: Three times a day (TID) | ORAL | Status: DC | PRN

## 2019-11-26 MED ORDER — DOCUSATE SODIUM 100 MG PO CAPS: 100.0000 mg | ORAL_CAPSULE | Freq: Every day | ORAL | Status: DC

## 2019-11-26 MED ORDER — MIRTAZAPINE 15 MG PO TABS: 15.0000 mg | ORAL_TABLET | Freq: Every day | ORAL | Status: DC

## 2019-11-26 MED ORDER — FUROSEMIDE 10 MG/ML IJ SOLN: 40.0000 mg | Freq: Once | INTRAMUSCULAR | Status: DC

## 2019-11-26 MED ORDER — FUROSEMIDE 40 MG PO TABS: 40.0000 mg | ORAL_TABLET | Freq: Two times a day (BID) | ORAL | Status: DC

## 2019-11-26 MED ORDER — ALBUTEROL SULFATE HFA 108 (90 BASE) MCG/ACT IN AERS
2.0000 | INHALATION_SPRAY | Freq: Once | RESPIRATORY_TRACT | Status: AC
  Administered 2019-11-26: 2 via RESPIRATORY_TRACT

## 2019-11-26 MED ORDER — INSULIN GLARGINE 100 UNIT/ML ~~LOC~~ SOLN
15.0000 [IU] | Freq: Every day | SUBCUTANEOUS | Status: DC
  Filled 2019-11-26: qty 0.15

## 2019-11-26 MED ORDER — ACETAMINOPHEN 325 MG PO TABS
650.0000 mg | ORAL_TABLET | Freq: Four times a day (QID) | ORAL | Status: DC | PRN
  Administered 2019-11-26: 650 mg via ORAL
  Filled 2019-11-26: qty 2

## 2019-11-26 MED ORDER — ALBUTEROL SULFATE HFA 108 (90 BASE) MCG/ACT IN AERS
2.0000 | INHALATION_SPRAY | Freq: Four times a day (QID) | RESPIRATORY_TRACT | Status: DC | PRN
  Filled 2019-11-26: qty 6.7

## 2019-11-26 MED ORDER — ASPIRIN EC 81 MG PO TBEC
81.0000 mg | DELAYED_RELEASE_TABLET | Freq: Every day | ORAL | Status: DC
  Administered 2019-11-26: 81 mg via ORAL
  Filled 2019-11-26: qty 1

## 2019-11-26 MED ORDER — CHOLESTYRAMINE 4 G PO PACK
4.0000 g | PACK | Freq: Three times a day (TID) | ORAL | Status: DC
  Filled 2019-11-26 (×3): qty 1

## 2019-11-26 MED ORDER — METOPROLOL TARTRATE 25 MG PO TABS
25.0000 mg | ORAL_TABLET | Freq: Two times a day (BID) | ORAL | Status: DC
  Administered 2019-11-26: 25 mg via ORAL
  Filled 2019-11-26: qty 1

## 2019-11-26 MED ORDER — FUROSEMIDE 40 MG PO TABS
80.0000 mg | ORAL_TABLET | Freq: Once | ORAL | Status: AC
  Administered 2019-11-26: 80 mg via ORAL
  Filled 2019-11-26: qty 2

## 2019-11-26 NOTE — ED Notes (Signed)
Pt now visualized resting in bed with even and unlabored respirations, eyes closed at this time. Will address bathroom needs when patient awakens.

## 2019-11-26 NOTE — ED Notes (Signed)
Pt has placed call to someone and made them aware that staff here (social work) have "asked about her and she gave her their information so they may be calling her, if they haven't already". Pt keeps stating that this hospital is "like [facility]"? Pt is now stating that she has plans to "check out" today because she is paying $100/day.

## 2019-11-26 NOTE — ED Notes (Signed)
Pt repeatedly asking for EDP, this RN explained would have EDP come speak with patient ASAP. Pt then began flagging EDP down from hall bed. Pt asking for something to eat at this time.

## 2019-11-26 NOTE — ED Notes (Signed)
Pt requesting to speak with SW regarding her care, this RN notified CSW at this time.

## 2019-11-26 NOTE — ED Notes (Signed)
Pt changed into clean, dry, gown due to having sudden onset of urinary incontinence. Pt assisted onto PTAR stretcher at this time.

## 2019-11-26 NOTE — ED Notes (Signed)
Pt can be heard on the phone complaining to oxygen company regarding hospital care and regarding oxygen delivery service yesterday regarding 2 tanks of oxygen.

## 2019-11-26 NOTE — ED Notes (Signed)
Social work at bedside speaking with pt

## 2019-11-26 NOTE — ED Notes (Signed)
Pt D/C back to [facility] via [place] at this time.

## 2019-11-26 NOTE — ED Provider Notes (Signed)
[facility] Emergency Department Provider Note   ____________________________________________   First MD Initiated Contact with Patient 11/26/19 [phone]     (approximate)  I have reviewed the triage vital signs and the nursing notes.   HISTORY  Chief Complaint Shortness of Breath and Chest Pain    HPI Karla Sanchez is a 79 y.o. female with past medical history of hypertension, diabetes, COPD on 3L, CHF, and colon cancer who presents to the ED complaining of shortness of breath.  Patient was discharged from the hospital 3 days ago following admission for shortness of breath related to CHF and COPD exacerbation.  She states that oxygen was delivered to the [facility] where she is staying but that the tank is old and does not work.  She states she has been off of her usual 3 L for the past couple of days and her breathing has been worse than usual.  She denies any associated cough or chest pain, has not had any fevers.  She does state that her legs are little more swollen than usual with the right being greater than the left, which is typical for her.  She is otherwise been compliant with her medications.  She states she feels better now that oxygen is back on and breathing is back to her baseline.        Past Medical History:  Diagnosis Date  . CHF (congestive heart failure) ([place])   . Colon cancer [facility])    s/p right and left colectomy 2020  . COPD (chronic obstructive pulmonary disease) ([place])   . Diabetes mellitus without complication ([place])   . Diastolic heart failure ([place])   . Gout   . Hypertension     Patient Active Problem List   Diagnosis Date Noted  . Acute respiratory failure with hypoxia ([place])   . Iron deficiency anemia   . Type 2 diabetes mellitus without complication, with long-term current use of insulin ([place])   . CHF exacerbation ([place]) 11/20/2019  . Acute on chronic diastolic (congestive) heart failure ([place]) 11/19/2019  . Acute pulmonary  edema (HCC)   . Acute on chronic diastolic CHF (congestive heart failure) ([place]) 10/22/2019  . Cellulitis of right foot 10/22/2019  . Non compliance with medical treatment   . Chronic respiratory failure with hypoxia ([place])   . Hypertension   . Chronic kidney disease, stage 3b   . Acute on chronic respiratory failure with hypoxia ([place])   . Diarrhea   . COPD (chronic obstructive pulmonary disease) ([place]) 10/03/2019  . Obesity, Class III, BMI 40-49.9 (morbid obesity) ([place]) 10/03/2019  . Acute exacerbation of CHF (congestive heart failure) ([place]) 09/08/2019  . Acute CHF (congestive heart failure) ([place]) 09/07/2019  . COPD exacerbation ([place]) 07/09/2019  . Chronic diastolic CHF (congestive heart failure) ([place]) 07/09/2019  . Type 2 diabetes mellitus with stage 3 chronic kidney disease ([place]) 07/09/2019  . Peripheral edema 07/09/2019  . Gout 07/09/2019  . Obesity, diabetes, and hypertension syndrome ([place]) 07/09/2019  . Anemia of chronic disease 07/09/2019    Past Surgical History:  Procedure Laterality Date  . COLON SURGERY    . TUBAL LIGATION  1970    Prior to Admission medications   Medication Sig Start Date End Date Taking? Authorizing Provider  acetaminophen (TYLENOL) 325 MG tablet Take 650 mg by mouth every 6 (six) hours as needed.   Yes [provider]  albuterol (VENTOLIN HFA) 108 (90 Base) MCG/ACT inhaler Inhale 2 puffs into the lungs every 6 (  six) hours as needed for wheezing or shortness of breath. 09/29/19  Yes Hackney, Tina A, FNP  allopurinol (ZYLOPRIM) 100 MG tablet Take by mouth.   Yes [provider]  aspirin 81 MG EC tablet Take by mouth.   Yes [provider]  Cholecalciferol 25 MCG (1000 UT) capsule Take by mouth.   Yes [provider]  cholestyramine (QUESTRAN) 4 g packet Take 4 g by mouth 3 (three) times daily with meals.  03/31/19  Yes [provider]  clotrimazole-betamethasone (LOTRISONE) cream Apply 1 application topically 2  (two) times daily.   Yes [provider]  colchicine 0.6 MG tablet Take 0.6 mg by mouth daily as needed.  04/13/19  Yes [provider]  Docusate Sodium (DSS) 100 MG CAPS Take 1 capsule by mouth at bedtime.  02/23/18  Yes [provider]  ferrous sulfate 325 (65 FE) MG tablet Take by mouth.   Yes [provider]  furosemide (LASIX) 40 MG tablet Take 1 tablet (40 mg total) by mouth 2 (two) times daily. 10/30/19 11/29/19 Yes Alma Friendly, MD  guaiFENesin (ROBITUSSIN) 100 MG/5ML liquid Take 200 mg by mouth 4 (four) times daily as needed for cough.   Yes [provider]  insulin glargine (LANTUS) 100 UNIT/ML injection Inject 0.15 mLs (15 Units total) into the skin at bedtime. 07/13/19  Yes Lorella Nimrod, MD  insulin lispro (HUMALOG) 100 UNIT/ML injection Inject into the skin 3 (three) times daily with meals.    Yes [provider]  ipratropium-albuterol (DUONEB) 0.5-2.5 (3) MG/3ML SOLN Take 3 mLs by nebulization every 6 (six) hours as needed.   Yes [provider]  metoprolol tartrate (LOPRESSOR) 50 MG tablet Take 0.5 tablets (25 mg total) by mouth 2 (two) times daily. 10/30/19  Yes Alma Friendly, MD  mirtazapine (REMERON) 15 MG tablet Take by mouth at bedtime.  10/25/18 11/29/19 Yes [provider]  senna-docusate (SENOKOT-S) 8.6-50 MG tablet Take 1 tablet by mouth daily. 02/23/18  Yes [provider]  zolpidem (AMBIEN) 5 MG tablet Take by mouth. 02/23/18  Yes [provider]  benzonatate (TESSALON) 200 MG capsule Take 1 capsule (200 mg total) by mouth 3 (three) times daily as needed for cough. 11/23/19   Max Sane, MD  hydrocortisone (ANUSOL-HC) 2.5 % rectal cream Place rectally 2 (two) times daily. 11/24/19   Max Sane, MD  polyethylene glycol (MIRALAX / GLYCOLAX) 17 g packet Take 17 g by mouth daily as needed for severe constipation. 11/23/19   Max Sane, MD    Allergies Codeine, Other, Exenatide,  Levofloxacin, Losartan, and Tetracyclines & related  Family History  Problem Relation Age of Onset  . Lung cancer Father     Social History Social History   Tobacco Use  . Smoking status: Never Smoker  . Smokeless tobacco: Never Used  Substance Use Topics  . Alcohol use: Not Currently  . Drug use: Never    Review of Systems  Constitutional: No fever/chills Eyes: No visual changes. ENT: No sore throat. Cardiovascular: Denies chest pain. Respiratory: Positive for shortness of breath. Gastrointestinal: No abdominal pain.  No nausea, no vomiting.  No diarrhea.  No constipation. Genitourinary: Negative for dysuria. Musculoskeletal: Negative for back pain.  Positive for leg swelling. Skin: Negative for rash. Neurological: Negative for headaches, focal weakness or numbness.  ____________________________________________   PHYSICAL EXAM:  VITAL SIGNS: ED Triage Vitals  Enc Vitals Group     BP 11/25/19 1720 (!) 132/58  Pulse Rate 11/25/19 1720 (!) 55     Resp 11/25/19 1720 20     Temp 11/25/19 1720 98.5 F (36.9 C)     Temp Source 11/25/19 1720 Oral     SpO2 11/25/19 1720 99 %     Weight 11/25/19 1722 244 lb 11.4 oz (111 kg)     Height 11/25/19 1722 5\' 6"  (1.676 m)     Head Circumference --      Peak Flow --      Pain Score 11/25/19 1724 0     Pain Loc --      Pain Edu? --      Excl. in [place]? --     Constitutional: Alert and oriented. Eyes: Conjunctivae are normal. Head: Atraumatic. Nose: No congestion/rhinnorhea. Mouth/Throat: Mucous membranes are moist. Neck: Normal ROM Cardiovascular: Normal rate, regular rhythm. Grossly normal heart sounds. Respiratory: Normal respiratory effort.  No retractions. Lungs with mild expiratory wheezing. Gastrointestinal: Soft and nontender. No distention. Genitourinary: deferred Musculoskeletal: No lower extremity tenderness.  2+ pitting edema to right lower extremity, 1+ pitting edema to left lower extremity. Neurologic:   Normal speech and language. No gross focal neurologic deficits are appreciated. Skin:  Skin is warm, dry and intact. No rash noted. Psychiatric: Mood and affect are normal. Speech and behavior are normal.  ____________________________________________   LABS (all labs ordered are listed, but only abnormal results are displayed)  Labs Reviewed  BASIC METABOLIC PANEL - Abnormal; Notable for the following components:      Result Value   Glucose, Bld 122 (*)    BUN 31 (*)    All other components within normal limits  CBC - Abnormal; Notable for the following components:   Hemoglobin 11.0 (*)    MCV 76.1 (*)    MCH 21.9 (*)    MCHC 28.7 (*)    RDW 19.8 (*)    All other components within normal limits  BRAIN NATRIURETIC PEPTIDE  TROPONIN I (HIGH SENSITIVITY)  TROPONIN I (HIGH SENSITIVITY)   ____________________________________________  EKG  ED ECG REPORT I, Blake Divine, the attending physician, personally viewed and interpreted this ECG.   Date: 11/26/2019  EKG Time: 17:30  Rate: 57  Rhythm: normal sinus rhythm  Axis: Normal  Intervals:right bundle branch block  ST&T Change: T wave inversions similar to previous   PROCEDURES  Procedure(s) performed (including Critical Care):  Procedures   ____________________________________________   INITIAL IMPRESSION / ASSESSMENT AND PLAN / ED COURSE       79 year old female with past medical history of hypertension, diabetes, COPD on 3 L, CHF, and colon cancer who presents to the ED completed over nonfunctioning oxygen tank at home with increased difficulty breathing due to this.  She now states that her breathing is back to her baseline with 3 L nasal cannula in place.  Work-up is reassuring, EKG shows right bundle branch block with no acute ischemic changes and troponin within normal limits.  Chest x-ray shows mild pulmonary edema and we will treat with double her typical dose of PO lasix.  She additionally has some mild  wheezing on exam which we will treat with albuterol.  Remainder of lab work is reassuring.  No indication for admission at this time, due to concerns with her home oxygen we will discuss with social work for further assistance.  Nursing was able to further discuss patient's current living arrangements with staff at the [facility]  They stated that they were doing the best they could to  help with the patient's needs but were unable to provide the care that she needs at the hotel.  They feel patient would be most appropriate at a nursing facility and I have discussed this concern with social work.  We will need to emphasize the need for nursing facility placement to the patient as she has refused this in the past.  Despite long discussion between patient, social work, and myself, patient remains insistent on returning to the hotel to live for now.  She states she would like to be placed in a nursing facility closer to [place], but when we offered to attempt to find her a facility in that area she still states that she would like to leave.  Patient has capacity to make these decisions at this time despite me emphasizing to her the need for long-term facility placement.  Social work arranged for replacement oxygen to be delivered here to the ED prior to patient's discharge, however now patient is stating she would like to leave prior to delivery of oxygen.  She now states that the oxygen where she is living is working and staff at the hotel have assisted her with it.  She will sign out AMA and Adult Protective Services were notified by social work.     ____________________________________________   FINAL CLINICAL IMPRESSION(S) / ED DIAGNOSES  Final diagnoses:  Chronic diastolic congestive heart failure ([place])  Chronic respiratory failure with hypoxia [facility])     ED Discharge Orders    None       Note:  This document was prepared using Dragon voice recognition software and may include  unintentional dictation errors.   Blake Divine, MD 11/26/19 1247

## 2019-11-26 NOTE — ED Notes (Signed)
Provided pt with sandwich box, per request. Also gave pt tylenol as ordered for "pain in my arm from the blood pressure cuff". Pt asked this RN to open her applesauce because she "doesn't have bones in her fingers". Pt in hallway, no signs of distress.

## 2019-11-26 NOTE — ED Notes (Signed)
EDP at bedside at this time. Pt states has been living at a hotel. Pt states yesterday had an oxygen tank delivered to her hotel room but had "a bad tank" delivered, so she called 911. Pt 83% on RA, 94-97% on 3L which patient states she is supposed to be on. Pt states she needs a new tank to go back to the hotel.

## 2019-11-26 NOTE — ED Notes (Signed)
Transport of medical necessity filled out by this RN and given to Network engineer to call for EMS transport back to [place], [place] form signed by patient, form obtained by [employer], Therapist, sports. Pt visualized resting in bed, NAD noted at this time.

## 2019-11-26 NOTE — TOC Initial Note (Signed)
Transition of Care [facility]) - Initial/Assessment Note    Patient Details  Name: Karla Sanchez MRN: [id number] Date of Birth: 1941-03-08  Transition of Care [facility]) CM/SW Contact:    Maebelle Munroe, RN Phone Number: 11/26/2019, 11:35 AM  Clinical Narrative:                  Pt. Refused SNF placement as recommended. Pt, wishes to return to hotel- [place] with O2, O2 tank delivery arranged with [employer] [phone].  Expected Discharge Plan: Home/Self Care Barriers to Discharge: No Barriers Identified   Patient Goals and CMS Choice   CMS Medicare.gov Compare Post Acute Care list provided to:: Patient Choice offered to / list presented to : Patient  Expected Discharge Plan and Services Expected Discharge Plan: Home/Self Care In-house Referral: Clinical Social Work Discharge Planning Services: CM Consult, Other - See comment (Arranged O2 delivery to ED in order for pt. to transport back to hotel.) Post Acute Care Choice: [employer] arrangements for the past 2 months: Hotel/Motel                 DME Arranged: Oxygen DME Agency: [employer] Date DME Agency Contacted: 11/26/19   Representative spoke with at DME Agency: Peter [phone]            Prior Living Arrangements/Services Living arrangements for the past 2 months: Hotel/Motel Lives with:: Self Patient language and need for interpreter reviewed:: Yes Do you feel safe going back to the place where you live?: Yes      Need for Family Participation in Patient Care: No (Comment) Care giver support system in place?: No (comment) Current home services: DME (Wheel chair and O2) Criminal Activity/Legal Involvement Pertinent to Current Situation/Hospitalization: No - Comment as needed  Activities of Daily Living      Permission Sought/Granted Permission sought to share information with : Case Manager Permission granted to share information with : Yes, Verbal Permission  Granted  Share Information with NAME: Friend- [name]           Emotional Assessment Appearance:: Appears stated age Attitude/Demeanor/Rapport: Self-Confident Affect (typically observed): Calm, Accepting, Appropriate Orientation: : Oriented to Self, Oriented to Place, Oriented to Situation Alcohol / Substance Use: Not Applicable Psych Involvement: No (comment)  Admission diagnosis:  breathing difficulty Patient Active Problem List   Diagnosis Date Noted  . Acute respiratory failure with hypoxia ([place])   . Iron deficiency anemia   . Type 2 diabetes mellitus without complication, with long-term current use of insulin ([place])   . CHF exacerbation ([place]) 11/20/2019  . Acute on chronic diastolic (congestive) heart failure ([place]) 11/19/2019  . Acute pulmonary edema (HCC)   . Acute on chronic diastolic CHF (congestive heart failure) ([place]) 10/22/2019  . Cellulitis of right foot 10/22/2019  . Non compliance with medical treatment   . Chronic respiratory failure with hypoxia ([place])   . Hypertension   . Chronic kidney disease, stage 3b   . Acute on chronic respiratory failure with hypoxia ([place])   . Diarrhea   . COPD (chronic obstructive pulmonary disease) ([place]) 10/03/2019  . Obesity, Class III, BMI 40-49.9 (morbid obesity) ([place]) 10/03/2019  . Acute exacerbation of CHF (congestive heart failure) ([place]) 09/08/2019  . Acute CHF (congestive heart failure) ([place]) 09/07/2019  . COPD exacerbation ([place]) 07/09/2019  . Chronic diastolic CHF (congestive heart failure) ([place]) 07/09/2019  . Type 2 diabetes mellitus with stage 3 chronic kidney disease ([place]) 07/09/2019  . Peripheral edema 07/09/2019  . Gout  07/09/2019  . Obesity, diabetes, and hypertension syndrome ([place]) 07/09/2019  . Anemia of chronic disease 07/09/2019   PCP:  Alfredia Client, MD Pharmacy:   [facility] [place], [address] [place]  [address] Phone: [phone] Fax: [phone]  [place], [place] [address] [place] [address] Phone: [phone] Fax: [phone]     Social Determinants of Health (SDOH) Interventions    Readmission Risk Interventions Readmission Risk Prevention Plan 10/24/2019  Medication Review ([place]) Complete  HRI or [place] Patient Refused

## 2019-11-26 NOTE — ED Notes (Addendum)
Pt can be heard by this RN speaking to oxygen company saying that she can drive to [place] for placement. Pt is also talking about "coming here for pneumonia and half dead." but is also stating that she can drive to [facility] and leave without her oxygen. Pt is eradic with her thoughts and her thought processes appear to be disheveled.

## 2019-11-26 NOTE — ED Notes (Signed)
Pt asking this RN about when she is going to "go to a room" and "get a different bed because this bed is hurting [her] tailbone". This RN explained to patient that all beds were full, would be able to assist patient with repositioning, also explained to patient that all rooms in ER full, unable to place patient in a room at this time. Pt also states "when am I going to get something to eat? I asked earlier and nobody ever brought me anything to eat." This RN explained to patient that she was sleeping and that's why patient wasn't provided with food. Pt states "no I wasn't, my eyes were closed, I wasn't given food because my eyes were closed?" This RN explained would bring her something to eat. Pt visualized in NAD at this time.

## 2019-11-26 NOTE — ED Notes (Signed)
Pt able to provide demonstration of how to use oxygen tank.

## 2019-11-26 NOTE — ED Notes (Signed)
[name] O2 tank from [place] delivered. Pt able to state to this RN how to work O2 tank verbally. This RN asked patient to give physical demonstration. Pt refused stating, "I can't right now, I'm laying here jerking, I got to pee". Pt visualized in NAD at this time, no jerking noted at this time, this RN explained will address bathroom needs at this time.

## 2019-11-26 NOTE — ED Notes (Signed)
Pt states, "I need to speak with the social worker, everything has changed". Pt now stating she can't leave because she doesn't have anyone to help her pack her car. Pt states her nephew [name] is in [place] and is unable to help her pack her car to go to [place]. EDP aware at this time and at bedside speaking with patient. Pt now stating she is agreeable to looking at care facilities in [place]. Pt states "I'll have to take my van, I can't leave it up here".

## 2019-11-26 NOTE — ED Notes (Signed)
Pt can be heard by this RN on the phone with someone asking if "someone can go and see if the oxygen tank is empty or working in my room". Pt then states, "oh everybody has left" and ended call.

## 2019-11-26 NOTE — ED Notes (Addendum)
ED Provider at bedside. EDP educated pt that we have oxygen coming to the hospital for her to safely be discharged. Pt states that she "has oxygen at the hotel and doesn't need to wait". Pt educated that if she is to leave prior to the oxygen arriving, she will be leaving AMA and was educated by EDP of the risks involved. Pt states, "I do that all the time". Pt also states that "the staff at the hotel are much better today than yesterday. They can help me figure out the other tank". Pt endorses having working oxygen tank at hotel. Megan RN at bedside, educated pt that hotel staff are not medical staff and not trained to properly assist pt with medical devices and issues. Pt states, "I know. Bye!". [facility]. CSW made aware pt leaving prior to oxygen tank delivery, states if pt leaves AMA prior to delivery, will arrange [facility].

## 2019-11-26 NOTE — TOC Progression Note (Signed)
Transition of Care [facility]) - Progression Note    Patient Details  Name: Karla Sanchez MRN: [id number] Date of Birth: 06/27/1940  Transition of Care [facility]) CM/SW Contact  Magdaline Zollars, Nonda Lou, [place] Phone Number: 11/26/2019, 2:07 PM  Clinical Narrative:     [place] [facility] spoke to pt about attending's recommendation for SNF placement. Pt refuses SNF placement. Relayed this information to the attending physician. Pt states she wants O2 and to return to the hotel- [employer] The attending discussed options for SNF placement in the [place] areas. Pt. again refused citing her belongings are at the hotel and she wishes to return there. O2 arranged via [employer]- [phone] to be delivered to the ED. Pt. to be discharged to the the hotel post delivery of O2. Faxed order sent to [place]- [phone].  Expected Discharge Plan: Home/Self Care Barriers to Discharge: No Barriers Identified  Expected Discharge Plan and Services Expected Discharge Plan: Home/Self Care In-house Referral: Clinical Social Work Discharge Planning Services: CM Consult, Other - See comment (Arranged O2 delivery to ED in order for pt. to transport back to hotel.) Post Acute Care Choice: [place] arrangements for the past 2 months: Hotel/Motel                 DME Arranged: Oxygen DME Agency: [employer] Date DME Agency Contacted: 11/26/19-   Representative spoke with at DME Agency: Peter [phone]             Social Determinants of Health ([place]) Interventions    Readmission Risk Interventions Readmission Risk Prevention Plan 10/24/2019  Medication Review (RN Care Manager) Complete  HRI or [place] Patient Refused

## 2019-11-26 NOTE — ED Notes (Addendum)
SPOKE WITH MANAGER OF [facility] IN WHICH PT IS RESIDING IT AT THIS. SHE STATES THAT THIS PT HAS BEEN LIVING AT HER [facility]AY INN SINCE January-February. SHE STATES THAT HER STAFF DOESN'T CARE FOR PT, BUT THAT THEY HAVE HELPED HER AS MUCH AS THEY CAN, AS FAR AS GETTING DIAPERS AND HELPING HER WITH GROCERIES THAT SHE HAS ORDERED. WHEN ASKED IF SHE HAS SEEN PT WALK, SHE STATES THAT SHE HAS SEEN HER STAND AND GET THINGS FROM THEIR "INSIDE STORE" AND SHE HAS A WALKER IN THE ROOM, BUT SHE'S USUALLY IN A WHEELCHAIR. SHE STATES THAT YESTERDAY THE PT CALLED AND ASKED THEM TO HELP HER WITH HER OXYGEN, AS IT WASN'T WORKING. THE MANAGER STATES THAT THE TANK WAS EMPTY, SO SHE GAVE HER THE NUMBER TO CALL, AND WITHIN A SHORT TIME PERIOD, THEY BROUGHT HER 2 MORE TANKS. SHE STATES THAT SHE PT DOES HAVE WORKING OXYGEN IN HER ROOM. SHE ALSO STATES THAT THE [name] NIECE HAD DROPPED HER OFF IN January-February AND WAS SUPPOSEDLY TO HELP CARE FOR HER, BUT SINCE THEY'VE CALLED AND BEEN TOLD THAT SHE IS "NOT RESPONSIBLE FOR THE PT". [facility] STAFF FEEL THAT THE PT NEEDS TO BE PLACED FOR CARE BUT ALSO STATES THAT SHE WAS AT A NURSING FACILITY AND PER THE PT, SHE "GOT IN HER CAR AND ESCAPED". STAFF AT [facility] ARE ALSO BURDENED BY ATTEMPTING TO CARE FOR PT AND HELP HER, WHEN THEY AREN'T AN ASSISTED LIVING FACILITY. PER STAFF SPECIFIC WORDS, "IT'S LIKE SHE WANTS EVERYONE TO CARE FOR HER, AND WE TRY TO, BUT WE CAN'T".

## 2019-11-26 NOTE — ED Notes (Signed)
Pt resting in hall bed. NAD.

## 2019-11-26 NOTE — ED Notes (Signed)
EDP and this RN to bedside to speak with patient. EDP clarified with patient that if patient was to be placed at facility from ED she would be unable to go to hotel and pack her belongings, she would go straight to facility from ED. Pt states she is unwilling to do that. EDP made clear to patient that her options from ED would be to be discharged back to hotel AMA or be discharged to facility per medical advice due to patient lacking resources/support to care for herself. Pt states that she wants to be discharged back to hotel at this time, EDP explained to patient that she would be transported back by ambulance, this RN explained EMS had already been called, awaiting arrival to ED for transport. Pt states understanding at this time.

## 2019-11-26 NOTE — ED Notes (Signed)
Pt asking when EMS will be here to pick her up. Educated pt that we are unaware of ETA's for EMS and that they would be here when they could.

## 2019-11-26 NOTE — ED Notes (Signed)
Pt can be heard by this RN speaking with social work regarding being placed in a facility or disposition plan. Pt is stating that she "can drive and still has her handicap [name] and license". Pt is also being very rude to social work stating, "I didn't ask for any of this! I am ready to go now!". Social work can be heard by this [facility] pt that she can't be safely discharged without having oxygen delivered here to the ED prior to her being discharged. Pt states, "I can leave without my oxygen!". Pt is here for SOB and decreased SPO2 due to alleged oxygen tanks "not working". Pt can't be appropriately nor safely discharged at this time, as her RA SPO2 is 82%. Social work Financial risk analyst with Provider at this time.

## 2019-11-26 NOTE — ED Notes (Signed)
This RN clarified with CSW that they have spoke with [employer] oxygen delivery service and arranged an oxygen tank to be delivered to the hospital. This RN then clarified with EDP that CSW arranged for an oxygen tank to be delivered to ED from [facility], however per notes, pt told CSW that she did not want to wait and was okay to leave with oxygen tank. Per EDP, RN and EDP will speak with patient to clarify plan with patient regarding whether patient is willing to wait for oxygen tank to be delivered to ED prior to discharge.

## 2019-11-28 NOTE — Discharge Summary (Signed)
[facility] [place] NAME: Karla Sanchez    MR#:  [id number]  DATE OF BIRTH:  Mar 31, 1940  DATE OF ADMISSION:  11/19/2019   ADMITTING PHYSICIAN: Christel Mormon, MD  DATE OF DISCHARGE: 11/24/2019 12:30 PM  PRIMARY CARE PHYSICIAN: Alfredia Client, MD   ADMISSION DIAGNOSIS:  CHF exacerbation ([place]) [I50.9] Acute on chronic diastolic (congestive) heart failure (HCC) [I50.33] Acute on chronic congestive heart failure, unspecified heart failure type ([place]) [I50.9] DISCHARGE DIAGNOSIS:  Active Problems:   Acute on chronic diastolic (congestive) heart failure (HCC)   CHF exacerbation (HCC)   Acute respiratory failure with hypoxia (HCC)   Iron deficiency anemia   Type 2 diabetes mellitus without complication, with long-term current use of insulin ([place])  SECONDARY DIAGNOSIS:   Past Medical History:  Diagnosis Date  . CHF (congestive heart failure) ([place])   . Colon cancer [facility])    s/p right and left colectomy 2020  . COPD (chronic obstructive pulmonary disease) ([place])   . Diabetes mellitus without complication ([place])   . Diastolic heart failure ([place])   . Gout   . Hypertension    HOSPITAL COURSE:  79 y.o.Caucasian femalewith a known history of diastolic CHF COPD, type 2 diabetes mellitus, gout and hypertension, who presented to the emergency room with 1 of several admissions recently for acute onset of worsening dyspnea.She was admitted for diastolic CHF exacerbation and hypoxia  1 acute hypoxic respiratory failure secondary to acute on chronic diastolic CHF exacerbation Good response to IV Lasix with clinical improvement.   2.  Iron deficiency anemia/history of colon adenocarcinoma??  Status post colectomy?? Patient noted to have had a colonoscopy January 2021 that showed internal hemorrhoids, previous history of colon mass adenocarcinoma.  IV Feraheme was ordered patient initially refused however agreed to take it and received IV Feraheme  11/21/2019.  Patient is to follow-up with [facility], Dr. Reesa Chew discussed extensively with patient and she would like to resume her care at [facility] upon discharge.    3.  History of COPD with very mild exacerbation Stable. No need for steroids. Treated with Pulmicort twice daily & scheduled duo nebs.   4.  Diabetes mellitus type II Hemoglobin A1c 8.0 (10/03/2019). Treated with Lantus, sliding scale insulin while inpt  5.  History of gout Continue allopurinol and colchicine.  6.  Hypertension Continue Lopressor.  Patient was very adamant in wanting to leave. PT/OT recommended SNF but patient didn't want to go any local SNFs and preferred to go to facilities in [place] close to her friend's home which wasn't possible per TOC. She was D/Ced back to [facility] where she came from. She was set up to receive home O2 at D/C by [facility] team.   DISCHARGE CONDITIONS:  fair CONSULTS OBTAINED:   DRUG ALLERGIES:   Allergies  Allergen Reactions  . Codeine Nausea Only, Other (See Comments) and Swelling    Other Reaction: nausea and vomiting Other reaction(s): NAUSEA   . Other Other (See Comments)    Other reaction(s): SWELLING  Other reaction(s): MUSCLE PAIN Muscle aches Other reaction(s): MUSCLE PAIN Muscle aches Other reaction(s): MUSCLE PAIN Muscle aches   . Exenatide Nausea And Vomiting  . Levofloxacin Other (See Comments)    Other Reaction: sloughing of buccal mucosa Other reaction(s): OTHER Lost skin in mouth Patient states she can take Cipro without difficulty and has taken it many times in the past Other Reaction: sloughing of buccal mucosa   . Losartan Other (See  Comments)    Weight gain Weight gain   . Tetracyclines & Related Other (See Comments)   DISCHARGE MEDICATIONS:   Allergies as of 11/24/2019      Reactions   Codeine Nausea Only, Other (See Comments), Swelling   Other Reaction: nausea and vomiting Other reaction(s): NAUSEA   Other Other (See Comments)    Other reaction(s): SWELLING Other reaction(s): MUSCLE PAIN Muscle aches Other reaction(s): MUSCLE PAIN Muscle aches Other reaction(s): MUSCLE PAIN Muscle aches   Exenatide Nausea And Vomiting   Levofloxacin Other (See Comments)   Other Reaction: sloughing of buccal mucosa Other reaction(s): OTHER Lost skin in mouth Patient states she can take Cipro without difficulty and has taken it many times in the past Other Reaction: sloughing of buccal mucosa   Losartan Other (See Comments)   Weight gain Weight gain   Tetracyclines & Related Other (See Comments)      Medication List    STOP taking these medications   loratadine 10 MG tablet Commonly known as: Claritin     TAKE these medications   acetaminophen 325 MG tablet Commonly known as: TYLENOL Take 650 mg by mouth every 6 (six) hours as needed.   albuterol 108 (90 Base) MCG/ACT inhaler Commonly known as: VENTOLIN HFA Inhale 2 puffs into the lungs every 6 (six) hours as needed for wheezing or shortness of breath.   benzonatate 200 MG capsule Commonly known as: TESSALON Take 1 capsule (200 mg total) by mouth 3 (three) times daily as needed for cough.   colchicine 0.6 MG tablet Take 0.6 mg by mouth daily as needed.   furosemide 40 MG tablet Commonly known as: Lasix Take 1 tablet (40 mg total) by mouth 2 (two) times daily.   hydrocortisone 2.5 % rectal cream Commonly known as: ANUSOL-HC Place rectally 2 (two) times daily.   insulin glargine 100 UNIT/ML injection Commonly known as: LANTUS Inject 0.15 mLs (15 Units total) into the skin at bedtime.   metoprolol tartrate 50 MG tablet Commonly known as: LOPRESSOR Take 0.5 tablets (25 mg total) by mouth 2 (two) times daily.   polyethylene glycol 17 g packet Commonly known as: MIRALAX / GLYCOLAX Take 17 g by mouth daily as needed for severe constipation.      DISCHARGE INSTRUCTIONS:   DIET:  Cardiac diet DISCHARGE CONDITION:  Stable ACTIVITY:  Activity as  tolerated OXYGEN:  Home Oxygen: Yes.    Oxygen Delivery: 2 liters/min via Patient connected to nasal cannula oxygen DISCHARGE LOCATION:  [facility]   If you experience worsening of your admission symptoms, develop shortness of breath, life threatening emergency, suicidal or homicidal thoughts you must seek medical attention immediately by calling 911 or calling your MD immediately  if symptoms less severe.  You Must read complete instructions/literature along with all the possible adverse reactions/side effects for all the Medicines you take and that have been prescribed to you. Take any new Medicines after you have completely understood and accpet all the possible adverse reactions/side effects.   Please note  You were cared for by a hospitalist during your hospital stay. If you have any questions about your discharge medications or the care you received while you were in the hospital after you are discharged, you can call the unit and asked to speak with the hospitalist on call if the hospitalist that took care of you is not available. Once you are discharged, your primary care physician will handle any further medical issues. Please note that NO REFILLS for  any discharge medications will be authorized once you are discharged, as it is imperative that you return to your primary care physician (or establish a relationship with a primary care physician if you do not have one) for your aftercare needs so that they can reassess your need for medications and monitor your lab values.    On the day of Discharge:  VITAL SIGNS:  Blood pressure (!) 147/55, pulse 70, temperature 97.8 F (36.6 C), temperature source Oral, resp. rate 18, height 5\' 6"  (1.676 m), weight 111.3 kg, SpO2 93 %. PHYSICAL EXAMINATION:  GENERAL:  79 y.o.-year-old patient lying in the bed with no acute distress.  EYES: Pupils equal, round, reactive to light and accommodation. No scleral icterus. Extraocular muscles intact.   HEENT: Head atraumatic, normocephalic. Oropharynx and nasopharynx clear.  NECK:  Supple, no jugular venous distention. No thyroid enlargement, no tenderness.  LUNGS: Normal breath sounds bilaterally, no wheezing, rales,rhonchi or crepitation. No use of accessory muscles of respiration.  CARDIOVASCULAR: S1, S2 normal. No murmurs, rubs, or gallops.  ABDOMEN: Soft, non-tender, non-distended. Bowel sounds present. No organomegaly or mass.  EXTREMITIES: No pedal edema, cyanosis, or clubbing.  NEUROLOGIC: Cranial nerves II through XII are intact. Muscle strength 5/5 in all extremities. Sensation intact. Gait not checked.  PSYCHIATRIC: The patient is alert and oriented x 3.  SKIN: No obvious rash, lesion, or ulcer.  DATA REVIEW:   CBC Recent Labs  Lab 11/25/19 1738  WBC 7.3  HGB 11.0*  HCT 38.3  PLT 299    Chemistries  Recent Labs  Lab 11/22/19 0911 11/22/19 0911 11/25/19 1738  NA 137   < > 141  K 4.5   < > 4.8  CL 97*   < > 100  CO2 28   < > 32  GLUCOSE 208*   < > 122*  BUN 35*   < > 31*  CREATININE 1.17*   < > 0.90  CALCIUM 8.2*   < > 9.0  MG 2.0  --   --    < > = values in this interval not displayed.     Outpatient follow-up  Follow-up Information    Schedule an appointment as soon as possible for a visit with Alfredia Client, MD.   Specialty: Family Medicine Why: Per Office; Patient has not been seen in 3 years;  Patient has to get another Doctor.  Contact information: [address] [place] [address] [phone]        Go to  [facility].   Specialty: Emergency Medicine Why: If symptoms worsen Contact information: [place] [id number] [address] [place] [phone]               Management plans discussed with the patient, family and they are in agreement.  CODE STATUS: Prior   TOTAL TIME TAKING CARE OF THIS PATIENT: 45 minutes.     Max Sane M.D on 11/28/2019 at 1:34 PM  [facility]   CC: Primary care physician; Alfredia Client, MD   Note: This dictation was prepared with Dragon dictation along with smaller phrase technology. Any transcriptional errors that result from this process are unintentional.

## 2019-11-29 ENCOUNTER — Telehealth (HOSPITAL_COMMUNITY): Payer: Self-pay

## 2019-11-29 NOTE — TOC Progression Note (Addendum)
Transition of Care [facility]) - Progression Note    Patient Details  Name: Karla Sanchez MRN: [id number] Date of Birth: [birth year]  Transition of Care [facility]) CM/SW Contact  Anselm Pancoast, RN Phone Number: 11/29/2019, 10:26 AM  Clinical Narrative:    Received call from [facility] @ [phone] requesting O2 sats be faxed to [phone] as they were never received.   10:42: Faxed requested information to [facility] @ [place]. Confirmed patient was already active with home O2 upon admission.    Expected Discharge Plan: Home/Self Care Barriers to Discharge: No Barriers Identified  Expected Discharge Plan and Services Expected Discharge Plan: Home/Self Care In-house Referral: Clinical Social Work Discharge Planning Services: CM Consult, Other - See comment (Arranged O2 delivery to ED in order for pt. to transport back to hotel.) Post Acute Care Choice: [place] arrangements for the past 2 months: Hotel/Motel                 DME Arranged: Oxygen DME Agency: [employer] Date DME Agency Contacted: 11/26/19   Representative spoke with at DME Agency: Peter Congo- [phone]             Social Determinants of Health ([place]) Interventions    Readmission Risk Interventions Readmission Risk Prevention Plan 10/24/2019  Medication Review (RN Care Manager) Complete  HRI or [place] Patient Refused

## 2019-11-30 ENCOUNTER — Telehealth (HOSPITAL_COMMUNITY): Payer: Self-pay

## 2019-11-30 NOTE — Telephone Encounter (Signed)
Had tried contacting Karla Sanchez several times when she finally answered.  She states she is so sick.  She has wheezes, asked if she had her oxygen on she advised yes.  She states they brought her a machine.  She is able to speak in full sentences.  She states she is so mad at this hospital where they do not want to help her.  Advised her everyone is trying to help her.  Asked why she will not except help in getting in to a SNF, here or [place] area, she states she has to take her [name] with her.  She advised her [name] is dead and she needs a garage to get it running for her then she will drive it.  She has no license and the tags are expired on it.   She states she needs some diapers, that she is out .  She states urine is just running out of her onto everything.  She states the hotel will not give her cash no more off her debit card so she can send someone to get them.  Advised her she can order them to be delivered.  Noticed that her last ED visit APS was contacted, she advised no one but the oxygen lady has came to visit.  Her medications are delivered to her from pharmacy.  She has no appts scheduled and she advised she dont want to see any of these doctors here.   Will check with APS to see if they are going to have a case.  Will visit her for heart failure.   [place] [phone]

## 2019-11-30 NOTE — Telephone Encounter (Signed)
Faiga contacted me and advised she needs help.  She is short of breath, audible wheezing.  She is able to speak full sentences but winded.  She states she needs rehab to get better according to the doctors.  Explained to her that they all tried to get her to commit herself to going to SNF and she has refused.  She states she knows she needs to go now.  Contacted APS and they advised they will take my report and pass it on to the supervisor, they would not tell me if they already was working on a case for her.  Trudee Kuster last ED visit that the social worker was [place] APS report.  Tamberly states no one has not showed up or called her.  She is refusing to go back to [facility], she states they lie and just want to cover their backs.  I advised they are trying to help her and she is going to have except the help they offer.  She states she will just die in the hotel room.  She did say she would be willing to go to a SNF in [place] to be close to [place] where her grandson lives.  While talking with her, she does not remember going to [facility] about a month ago.  She does not appear to me be able to make accurate decisions on her behalf.  Advised APS the same.  Emelia also states she is unable to care for herself at this time, she states she is just getting weaker and weaker.   [place] [phone]

## 2019-12-04 ENCOUNTER — Encounter (HOSPITAL_COMMUNITY): Payer: Self-pay

## 2019-12-04 NOTE — Progress Notes (Signed)
Ann Lions with Social Services contacted me this am and stated they made a visit with Kennyth Lose.  They are unsure if they will be able to help her, she states she is able to make her own decisions.  Discussed things that I am concerned with, like her behavior when she goes to hospital, unable to get the care and items she needs by living in a hotel.  She has no transportation right now and has no family support.  The Hotel is backing off from helping stating they are not equipped to do what she needs.  They thought her stay was short term,  Its been almost 6 months.  Discussed with her that [facility] told me she would go to a skilled nursing in [place].  She states she will check in to that.  Attempted to contact Nezzie but no answer and unable to leave message.  Will continue to try to reach her.   [place] [phone]

## 2019-12-07 ENCOUNTER — Encounter: Payer: Self-pay | Admitting: Emergency Medicine

## 2019-12-07 ENCOUNTER — Emergency Department: Payer: Medicare Other

## 2019-12-07 DIAGNOSIS — R062 Wheezing: Secondary | ICD-10-CM | POA: Diagnosis not present

## 2019-12-07 DIAGNOSIS — I5032 Chronic diastolic (congestive) heart failure: Secondary | ICD-10-CM | POA: Diagnosis present

## 2019-12-07 DIAGNOSIS — Z79899 Other long term (current) drug therapy: Secondary | ICD-10-CM

## 2019-12-07 DIAGNOSIS — Z9981 Dependence on supplemental oxygen: Secondary | ICD-10-CM

## 2019-12-07 DIAGNOSIS — J9611 Chronic respiratory failure with hypoxia: Secondary | ICD-10-CM | POA: Diagnosis present

## 2019-12-07 DIAGNOSIS — Z9119 Patient's noncompliance with other medical treatment and regimen: Secondary | ICD-10-CM

## 2019-12-07 DIAGNOSIS — T447X5A Adverse effect of beta-adrenoreceptor antagonists, initial encounter: Secondary | ICD-10-CM | POA: Diagnosis present

## 2019-12-07 DIAGNOSIS — Z9049 Acquired absence of other specified parts of digestive tract: Secondary | ICD-10-CM

## 2019-12-07 DIAGNOSIS — Z6835 Body mass index (BMI) 35.0-35.9, adult: Secondary | ICD-10-CM

## 2019-12-07 DIAGNOSIS — Z993 Dependence on wheelchair: Secondary | ICD-10-CM

## 2019-12-07 DIAGNOSIS — Z85038 Personal history of other malignant neoplasm of large intestine: Secondary | ICD-10-CM

## 2019-12-07 DIAGNOSIS — J441 Chronic obstructive pulmonary disease with (acute) exacerbation: Secondary | ICD-10-CM | POA: Diagnosis not present

## 2019-12-07 DIAGNOSIS — Z20822 Contact with and (suspected) exposure to covid-19: Secondary | ICD-10-CM | POA: Diagnosis present

## 2019-12-07 DIAGNOSIS — Z7982 Long term (current) use of aspirin: Secondary | ICD-10-CM

## 2019-12-07 DIAGNOSIS — N1831 Chronic kidney disease, stage 3a: Secondary | ICD-10-CM | POA: Diagnosis present

## 2019-12-07 DIAGNOSIS — R001 Bradycardia, unspecified: Secondary | ICD-10-CM | POA: Diagnosis present

## 2019-12-07 DIAGNOSIS — Z794 Long term (current) use of insulin: Secondary | ICD-10-CM

## 2019-12-07 DIAGNOSIS — D631 Anemia in chronic kidney disease: Secondary | ICD-10-CM | POA: Diagnosis present

## 2019-12-07 DIAGNOSIS — I440 Atrioventricular block, first degree: Secondary | ICD-10-CM | POA: Diagnosis present

## 2019-12-07 DIAGNOSIS — I130 Hypertensive heart and chronic kidney disease with heart failure and stage 1 through stage 4 chronic kidney disease, or unspecified chronic kidney disease: Secondary | ICD-10-CM | POA: Diagnosis present

## 2019-12-07 DIAGNOSIS — E875 Hyperkalemia: Secondary | ICD-10-CM | POA: Diagnosis present

## 2019-12-07 DIAGNOSIS — I4510 Unspecified right bundle-branch block: Secondary | ICD-10-CM | POA: Diagnosis present

## 2019-12-07 DIAGNOSIS — E1122 Type 2 diabetes mellitus with diabetic chronic kidney disease: Secondary | ICD-10-CM | POA: Diagnosis present

## 2019-12-07 LAB — CBC
HCT: 35.6 % — ABNORMAL LOW (ref 36.0–46.0)
Hemoglobin: 10.3 g/dL — ABNORMAL LOW (ref 12.0–15.0)
MCH: 22.5 pg — ABNORMAL LOW (ref 26.0–34.0)
MCHC: 28.9 g/dL — ABNORMAL LOW (ref 30.0–36.0)
MCV: 77.9 fL — ABNORMAL LOW (ref 80.0–100.0)
Platelets: 265 10*3/uL (ref 150–400)
RBC: 4.57 MIL/uL (ref 3.87–5.11)
RDW: 21 % — ABNORMAL HIGH (ref 11.5–15.5)
WBC: 7.7 10*3/uL (ref 4.0–10.5)
nRBC: 0 % (ref 0.0–0.2)

## 2019-12-07 LAB — BASIC METABOLIC PANEL
Anion gap: 11 (ref 5–15)
BUN: 38 mg/dL — ABNORMAL HIGH (ref 8–23)
CO2: 28 mmol/L (ref 22–32)
Calcium: 8.5 mg/dL — ABNORMAL LOW (ref 8.9–10.3)
Chloride: 102 mmol/L (ref 98–111)
Creatinine, Ser: 0.97 mg/dL (ref 0.44–1.00)
GFR calc Af Amer: >60 mL/min (ref >60)
GFR calc non Af Amer: 56 mL/min — ABNORMAL LOW (ref >60)
Glucose, Bld: 136 mg/dL — ABNORMAL HIGH (ref 70–99)
Potassium: 5.1 mmol/L (ref 3.5–5.1)
Sodium: 141 mmol/L (ref 135–145)

## 2019-12-07 LAB — TROPONIN I (HIGH SENSITIVITY): Troponin I (High Sensitivity): 14 ng/L (ref <18)

## 2019-12-07 NOTE — ED Triage Notes (Signed)
Pt in via EMS from the [facility] with c/o SOB  94% on 2L, HR 70's, 151/73

## 2019-12-07 NOTE — ED Notes (Signed)
Patient refused chest xray.

## 2019-12-07 NOTE — ED Triage Notes (Signed)
Pt c/o SOB and swelling to the right leg for "months." Pt denies chest pain. Pt on 3L O2 chronic.

## 2019-12-07 NOTE — ED Notes (Signed)
Patient refusing repeat troponin (blood draw)

## 2019-12-08 ENCOUNTER — Emergency Department: Payer: Medicare Other

## 2019-12-08 ENCOUNTER — Inpatient Hospital Stay
Admission: EM | Admit: 2019-12-08 | Discharge: 2019-12-09 | DRG: 191 | Discharge status: Against Medical Advice | Payer: Medicare Other | Attending: Internal Medicine | Admitting: Internal Medicine

## 2019-12-08 DIAGNOSIS — Z91199 Patient's noncompliance with other medical treatment and regimen due to unspecified reason: Secondary | ICD-10-CM

## 2019-12-08 DIAGNOSIS — J441 Chronic obstructive pulmonary disease with (acute) exacerbation: Principal | ICD-10-CM

## 2019-12-08 DIAGNOSIS — I5032 Chronic diastolic (congestive) heart failure: Secondary | ICD-10-CM | POA: Diagnosis not present

## 2019-12-08 DIAGNOSIS — Z20822 Contact with and (suspected) exposure to covid-19: Secondary | ICD-10-CM | POA: Diagnosis present

## 2019-12-08 DIAGNOSIS — Z9119 Patient's noncompliance with other medical treatment and regimen: Secondary | ICD-10-CM | POA: Diagnosis not present

## 2019-12-08 DIAGNOSIS — Z9981 Dependence on supplemental oxygen: Secondary | ICD-10-CM | POA: Diagnosis not present

## 2019-12-08 DIAGNOSIS — R062 Wheezing: Secondary | ICD-10-CM

## 2019-12-08 DIAGNOSIS — D638 Anemia in other chronic diseases classified elsewhere: Secondary | ICD-10-CM | POA: Diagnosis not present

## 2019-12-08 DIAGNOSIS — J9611 Chronic respiratory failure with hypoxia: Secondary | ICD-10-CM | POA: Diagnosis present

## 2019-12-08 DIAGNOSIS — N1831 Chronic kidney disease, stage 3a: Secondary | ICD-10-CM | POA: Diagnosis present

## 2019-12-08 DIAGNOSIS — E1122 Type 2 diabetes mellitus with diabetic chronic kidney disease: Secondary | ICD-10-CM | POA: Diagnosis present

## 2019-12-08 DIAGNOSIS — Z85038 Personal history of other malignant neoplasm of large intestine: Secondary | ICD-10-CM | POA: Diagnosis not present

## 2019-12-08 DIAGNOSIS — I4510 Unspecified right bundle-branch block: Secondary | ICD-10-CM | POA: Diagnosis present

## 2019-12-08 DIAGNOSIS — I440 Atrioventricular block, first degree: Secondary | ICD-10-CM | POA: Diagnosis present

## 2019-12-08 DIAGNOSIS — Z794 Long term (current) use of insulin: Secondary | ICD-10-CM | POA: Diagnosis not present

## 2019-12-08 DIAGNOSIS — Z79899 Other long term (current) drug therapy: Secondary | ICD-10-CM | POA: Diagnosis not present

## 2019-12-08 DIAGNOSIS — R5381 Other malaise: Secondary | ICD-10-CM | POA: Diagnosis present

## 2019-12-08 DIAGNOSIS — Z7982 Long term (current) use of aspirin: Secondary | ICD-10-CM | POA: Diagnosis not present

## 2019-12-08 DIAGNOSIS — I130 Hypertensive heart and chronic kidney disease with heart failure and stage 1 through stage 4 chronic kidney disease, or unspecified chronic kidney disease: Secondary | ICD-10-CM | POA: Diagnosis present

## 2019-12-08 DIAGNOSIS — Z993 Dependence on wheelchair: Secondary | ICD-10-CM | POA: Diagnosis not present

## 2019-12-08 DIAGNOSIS — Z9049 Acquired absence of other specified parts of digestive tract: Secondary | ICD-10-CM | POA: Diagnosis not present

## 2019-12-08 DIAGNOSIS — E119 Type 2 diabetes mellitus without complications: Secondary | ICD-10-CM | POA: Diagnosis present

## 2019-12-08 DIAGNOSIS — T447X5A Adverse effect of beta-adrenoreceptor antagonists, initial encounter: Secondary | ICD-10-CM | POA: Diagnosis present

## 2019-12-08 DIAGNOSIS — D631 Anemia in chronic kidney disease: Secondary | ICD-10-CM | POA: Diagnosis present

## 2019-12-08 DIAGNOSIS — E875 Hyperkalemia: Secondary | ICD-10-CM | POA: Diagnosis present

## 2019-12-08 DIAGNOSIS — R001 Bradycardia, unspecified: Secondary | ICD-10-CM | POA: Diagnosis present

## 2019-12-08 DIAGNOSIS — Z6835 Body mass index (BMI) 35.0-35.9, adult: Secondary | ICD-10-CM | POA: Diagnosis not present

## 2019-12-08 LAB — BRAIN NATRIURETIC PEPTIDE: B Natriuretic Peptide: 57.9 pg/mL (ref 0.0–100.0)

## 2019-12-08 LAB — HIV ANTIBODY (ROUTINE TESTING W REFLEX): HIV Screen 4th Generation wRfx: NONREACTIVE

## 2019-12-08 LAB — SARS CORONAVIRUS 2 BY RT PCR (HOSPITAL ORDER, PERFORMED IN ~~LOC~~ HOSPITAL LAB): SARS Coronavirus 2: NEGATIVE

## 2019-12-08 MED ORDER — ACETAMINOPHEN 325 MG PO TABS: 650.0000 mg | ORAL_TABLET | Freq: Four times a day (QID) | ORAL | Status: DC | PRN

## 2019-12-08 MED ORDER — FUROSEMIDE 40 MG PO TABS: 40.0000 mg | ORAL_TABLET | Freq: Two times a day (BID) | ORAL | Status: DC

## 2019-12-08 MED ORDER — POLYETHYLENE GLYCOL 3350 17 G PO PACK: 17.0000 g | PACK | Freq: Every day | ORAL | Status: DC | PRN

## 2019-12-08 MED ORDER — INSULIN GLARGINE 100 UNIT/ML ~~LOC~~ SOLN
15.0000 [IU] | Freq: Every day | SUBCUTANEOUS | Status: DC
  Filled 2019-12-08 (×2): qty 0.15

## 2019-12-08 MED ORDER — ALBUTEROL SULFATE (2.5 MG/3ML) 0.083% IN NEBU
3.0000 mL | INHALATION_SOLUTION | Freq: Once | RESPIRATORY_TRACT | Status: DC
  Filled 2019-12-08: qty 3

## 2019-12-08 MED ORDER — IPRATROPIUM-ALBUTEROL 0.5-2.5 (3) MG/3ML IN SOLN
3.0000 mL | Freq: Once | RESPIRATORY_TRACT | Status: DC
  Filled 2019-12-08: qty 3

## 2019-12-08 MED ORDER — METOPROLOL TARTRATE 25 MG PO TABS: 25.0000 mg | ORAL_TABLET | Freq: Two times a day (BID) | ORAL | Status: DC

## 2019-12-08 MED ORDER — BUDESONIDE 0.5 MG/2ML IN SUSP: 2.0000 mg | Freq: Four times a day (QID) | RESPIRATORY_TRACT | Status: DC

## 2019-12-08 MED ORDER — SODIUM CHLORIDE 0.9% FLUSH: 3.0000 mL | INTRAVENOUS | Status: DC | PRN

## 2019-12-08 MED ORDER — LORAZEPAM 1 MG PO TABS: 1.0000 mg | ORAL_TABLET | Freq: Once | ORAL | Status: DC

## 2019-12-08 MED ORDER — SODIUM CHLORIDE 0.9% FLUSH
3.0000 mL | Freq: Two times a day (BID) | INTRAVENOUS | Status: DC
  Administered 2019-12-08 – 2019-12-09 (×3): 3 mL via INTRAVENOUS

## 2019-12-08 MED ORDER — BUDESONIDE 0.5 MG/2ML IN SUSP
2.0000 mg | Freq: Two times a day (BID) | RESPIRATORY_TRACT | Status: DC
  Administered 2019-12-08: 2 mg via RESPIRATORY_TRACT
  Filled 2019-12-08 (×2): qty 8

## 2019-12-08 MED ORDER — IPRATROPIUM-ALBUTEROL 0.5-2.5 (3) MG/3ML IN SOLN: 3.0000 mL | Freq: Four times a day (QID) | RESPIRATORY_TRACT | Status: DC | PRN

## 2019-12-08 MED ORDER — SODIUM CHLORIDE 0.9 % IV SOLN: 250.0000 mL | INTRAVENOUS | Status: DC | PRN

## 2019-12-08 MED ORDER — COLCHICINE 0.6 MG PO TABS
0.6000 mg | ORAL_TABLET | Freq: Every day | ORAL | Status: DC | PRN
  Filled 2019-12-08: qty 1

## 2019-12-08 MED ORDER — METHYLPREDNISOLONE SODIUM SUCC 125 MG IJ SOLR
125.0000 mg | Freq: Once | INTRAMUSCULAR | Status: AC
  Administered 2019-12-08: 125 mg via INTRAVENOUS
  Filled 2019-12-08 (×2): qty 2

## 2019-12-08 MED ORDER — BENZONATATE 100 MG PO CAPS
200.0000 mg | ORAL_CAPSULE | Freq: Three times a day (TID) | ORAL | Status: DC | PRN
  Administered 2019-12-08: 200 mg via ORAL
  Filled 2019-12-08: qty 2

## 2019-12-08 MED ORDER — ALPRAZOLAM 0.5 MG PO TABS
0.5000 mg | ORAL_TABLET | Freq: Once | ORAL | Status: AC
  Administered 2019-12-09: 0.5 mg via ORAL
  Filled 2019-12-08: qty 1

## 2019-12-08 MED ORDER — ENOXAPARIN SODIUM 40 MG/0.4ML ~~LOC~~ SOLN
40.0000 mg | SUBCUTANEOUS | Status: DC
  Filled 2019-12-08: qty 0.4

## 2019-12-08 NOTE — TOC Initial Note (Signed)
Transition of Care [facility]) - Initial/Assessment Note    Patient Details  Name: Karla Sanchez MRN: [id number] Date of Birth: 23-Dec-1940  Transition of Care [facility]) CM/SW Contact:    Ova Freshwater Phone Number: [phone] 12/08/2019, 3:25 PM  Clinical Narrative:                  Patient presents to [facility] ED due to SOB.  Patient has open [facility] DSS/APS report Leland Johns [phone], fax [phone].  [facility] DSS called on behalf of Ms. Boger-Bass and requested FL2 faxed to Ms. Boger-Bass.  CSW sent FL2 for signature.  Expected Discharge Plan: [place] Barriers to Discharge: Continued Medical Work up, ED Unsafe disposition, ED Uninsured needing medication assistance, ED Medication assistance   Patient Goals and CMS Choice Patient states their goals for this hospitalization and ongoing recovery are:: Patient has open APS/DSS case (336) [phone] Leland Johns, case worker CMS Medicare.gov Compare Post Acute Care list provided to:: Other (Comment Required) (APS/DSS [phone]) [phone] Leland Johns, case worker) Choice offered to / list presented to : NA  Expected Discharge Plan and Services Expected Discharge Plan: [place] In-house Referral: Clinical Social Work   Post Acute Care Choice: [place] Living arrangements for the past 2 months: Hotel/Motel                                      Prior Living Arrangements/Services Living arrangements for the past 2 months: Hotel/Motel Lives with:: Self Patient language and need for interpreter reviewed:: Yes Do you feel safe going back to the place where you live?: Yes      Need for Family Participation in Patient Care: No (Comment) Care giver support system in place?: No (comment) Current home services: DME Criminal Activity/Legal Involvement Pertinent to Current Situation/Hospitalization: No - Comment as needed  Activities of Daily Living       Permission Sought/Granted Permission sought to share information with : Case Manager (Patient has open APS/DSS case (336) [phone] Leland Johns, case worker)    Share Information with NAME: APS/DSS case (336) [phone] Leland Johns, case worker  Permission granted to share info w AGENCY: [facility] DSS        Emotional Assessment Appearance:: Appears stated age Attitude/Demeanor/Rapport: Uncooperative Affect (typically observed): Irritable Orientation: : Oriented to Self, Oriented to Place, Oriented to  Time, Oriented to Situation Alcohol / Substance Use: Not Applicable Psych Involvement: No (comment)  Admission diagnosis:  COPD with acute exacerbation ([place]) [J44.1] Patient Active Problem List   Diagnosis Date Noted  . COPD with acute exacerbation ([place]) 12/08/2019  . Physical deconditioning 12/08/2019  . Acute respiratory failure with hypoxia ([place])   . Iron deficiency anemia   . Type 2 diabetes mellitus without complication, with long-term current use of insulin ([place])   . CHF exacerbation ([place]) 11/20/2019  . Acute on chronic diastolic (congestive) heart failure ([place]) 11/19/2019  . Acute pulmonary edema (HCC)   . Acute on chronic diastolic CHF (congestive heart failure) ([place]) 10/22/2019  . Cellulitis of right foot 10/22/2019  . Non compliance with medical treatment   . Chronic respiratory failure with hypoxia ([place])   . Hypertension   . Chronic kidney disease, stage 3b   . Acute on chronic respiratory failure with hypoxia ([place])   . Diarrhea   . COPD (chronic obstructive pulmonary disease) ([place]) 10/03/2019  . Obesity, Class  III, BMI 40-49.9 (morbid obesity) ([place]) 10/03/2019  . Acute exacerbation of CHF (congestive heart failure) ([place]) 09/08/2019  . Acute CHF (congestive heart failure) ([place]) 09/07/2019  . COPD exacerbation ([place]) 07/09/2019  . Chronic diastolic CHF (congestive heart failure) ([place]) 07/09/2019  . Type 2 diabetes mellitus with stage 3 chronic  kidney disease ([place]) 07/09/2019  . Peripheral edema 07/09/2019  . Gout 07/09/2019  . Obesity, diabetes, and hypertension syndrome ([place]) 07/09/2019  . Anemia of chronic disease 07/09/2019   PCP:  Alfredia Client, MD Pharmacy:   [facility] [address], [place] [address] Phone: [phone] Fax: [phone]  [place], [place] - [address] [place] [address] Phone: [phone] Fax: [phone]     Social Determinants of Health (SDOH) Interventions    Readmission Risk Interventions Readmission Risk Prevention Plan 10/24/2019  Medication Review ([place]) Complete  HRI or [place] Patient Refused

## 2019-12-08 NOTE — ED Notes (Signed)
Patient incontinent urine.  Transferred from wheelchair to stretcher.  Skin care given.  Clean gown placed on patient.  Patient tolerated well.

## 2019-12-08 NOTE — ED Notes (Signed)
Pt given graham crackers and peanut butter with diet soda. Pt is in NAD.

## 2019-12-08 NOTE — ED Notes (Signed)
Pt refused xray for the second time,

## 2019-12-08 NOTE — ED Notes (Addendum)
Pt is in hallway bed; Agbata MD has been messaged to request they change nebulizer solution to HFA.  Update 1484:   Pt is covid negative, HFAs are reserved for covid positive per MD, charge oks nebulizing in hallway.

## 2019-12-08 NOTE — ED Provider Notes (Signed)
[facility] Emergency Department Provider Note  ____________________________________________   First MD Initiated Contact with Patient 12/08/19 1223     (approximate)  I have reviewed the triage vital signs and the nursing notes.   HISTORY  Chief Complaint Shortness of Breath    HPI Karla Sanchez is a 79 y.o. female  Here with SOB, cough. Pt has an extensive h/o CHF, COPD. Reports that over the last several days, she has noticed progressively worsening SOB, both with exertion and at rest, along with generalized weakness. She is wheelchair bound but has noticed increasing difficulty transferring and is having difficulty managing herself outside of the hospital (lives alone in a hotel at the moment). Reports that she has discussed with her PCP and SW who feel she needs SNF placement, which she agrees with. Reports that her cough, wheezing, SOB have all worsened over past 24 hours which is why she presents today. No known fever, chills. She has had some white sputum production. No hemoptysis.     Of note, she has also noticed increasing R>L leg swelling. She has chronic edema from CHF but states it is significantly worse than usual. No trauma. No recent travel.    Past Medical History:  Diagnosis Date  . CHF (congestive heart failure) ([place])   . Colon cancer [facility])    s/p right and left colectomy 2020  . COPD (chronic obstructive pulmonary disease) ([place])   . Diabetes mellitus without complication ([place])   . Diastolic heart failure ([place])   . Gout   . Hypertension     Patient Active Problem List   Diagnosis Date Noted  . COPD with acute exacerbation ([place]) 12/08/2019  . Physical deconditioning 12/08/2019  . Acute respiratory failure with hypoxia ([place])   . Iron deficiency anemia   . Type 2 diabetes mellitus without complication, with long-term current use of insulin ([place])   . CHF exacerbation ([place]) 11/20/2019  . Acute on chronic diastolic (congestive)  heart failure ([place]) 11/19/2019  . Acute pulmonary edema (HCC)   . Acute on chronic diastolic CHF (congestive heart failure) ([place]) 10/22/2019  . Cellulitis of right foot 10/22/2019  . Non compliance with medical treatment   . Chronic respiratory failure with hypoxia ([place])   . Hypertension   . Chronic kidney disease, stage 3b   . Acute on chronic respiratory failure with hypoxia ([place])   . Diarrhea   . COPD (chronic obstructive pulmonary disease) ([place]) 10/03/2019  . Obesity, Class III, BMI 40-49.9 (morbid obesity) ([place]) 10/03/2019  . Acute exacerbation of CHF (congestive heart failure) ([place]) 09/08/2019  . Acute CHF (congestive heart failure) ([place]) 09/07/2019  . COPD exacerbation ([place]) 07/09/2019  . Chronic diastolic CHF (congestive heart failure) ([place]) 07/09/2019  . Type 2 diabetes mellitus with stage 3 chronic kidney disease ([place]) 07/09/2019  . Peripheral edema 07/09/2019  . Gout 07/09/2019  . Obesity, diabetes, and hypertension syndrome ([place]) 07/09/2019  . Anemia of chronic disease 07/09/2019    Past Surgical History:  Procedure Laterality Date  . COLON SURGERY    . TUBAL LIGATION  1970    Prior to Admission medications   Medication Sig Start Date End Date Taking? Authorizing Provider  acetaminophen (TYLENOL) 325 MG tablet Take 650 mg by mouth every 6 (six) hours as needed for mild pain or fever.    Yes [provider]  albuterol (VENTOLIN HFA) 108 (90 Base) MCG/ACT inhaler Inhale 2 puffs into the lungs every 6 (six) hours as needed for wheezing  or shortness of breath. 09/29/19  Yes Hackney, Tina A, FNP  benzonatate (TESSALON) 200 MG capsule Take 1 capsule (200 mg total) by mouth 3 (three) times daily as needed for cough. 11/23/19  Yes Max Sane, MD  colchicine 0.6 MG tablet Take 0.6 mg by mouth daily as needed (acute gout flare).    Yes [provider]  furosemide (LASIX) 40 MG tablet Take 40 mg by mouth 2 (two) times daily.   Yes [provider]   hydrocortisone (ANUSOL-HC) 2.5 % rectal cream Place rectally 2 (two) times daily. 11/24/19  Yes Max Sane, MD  insulin glargine (LANTUS) 100 UNIT/ML injection Inject 0.15 mLs (15 Units total) into the skin at bedtime. 07/13/19  Yes Lorella Nimrod, MD  metoprolol tartrate (LOPRESSOR) 50 MG tablet Take 0.5 tablets (25 mg total) by mouth 2 (two) times daily. 10/30/19  Yes Alma Friendly, MD  polyethylene glycol (MIRALAX / GLYCOLAX) 17 g packet Take 17 g by mouth daily as needed for severe constipation. 11/23/19  Yes Max Sane, MD    Allergies Codeine, Other, Exenatide, Levofloxacin, Losartan, and Tetracyclines & related  Family History  Problem Relation Age of Onset  . Lung cancer Father     Social History Social History   Tobacco Use  . Smoking status: Never Smoker  . Smokeless tobacco: Never Used  Substance Use Topics  . Alcohol use: Not Currently  . Drug use: Never    Review of Systems  Review of Systems  Constitutional: Positive for fatigue. Negative for fever.  HENT: Negative for congestion and sore throat.   Eyes: Negative for visual disturbance.  Respiratory: Positive for cough, shortness of breath and wheezing.   Cardiovascular: Positive for leg swelling. Negative for chest pain.  Gastrointestinal: Negative for abdominal pain, diarrhea, nausea and vomiting.  Genitourinary: Negative for flank pain.  Musculoskeletal: Negative for back pain and neck pain.  Skin: Negative for rash and wound.  Neurological: Negative for weakness.  All other systems reviewed and are negative.    ____________________________________________  PHYSICAL EXAM:      VITAL SIGNS: ED Triage Vitals  Enc Vitals Group     BP 12/07/19 1846 110/85     Pulse Rate 12/07/19 1846 (!) 56     Resp 12/07/19 1846 (!) 22     Temp 12/07/19 1846 98.2 F (36.8 C)     Temp Source 12/07/19 1846 Oral     SpO2 12/07/19 1846 95 %     Weight 12/07/19 1848 220 lb (99.8 kg)     Height 12/07/19 1848 5\' 6"   (1.676 m)     Head Circumference --      Peak Flow --      Pain Score --      Pain Loc --      Pain Edu? --      Excl. in [place]? --      Physical Exam Vitals and nursing note reviewed.  Constitutional:      General: She is not in acute distress.    Appearance: She is well-developed.  HENT:     Head: Normocephalic and atraumatic.  Eyes:     Conjunctiva/sclera: Conjunctivae normal.  Cardiovascular:     Rate and Rhythm: Normal rate and regular rhythm.     Heart sounds: Normal heart sounds. No murmur heard.  No friction rub.  Pulmonary:     Effort: Pulmonary effort is normal. No respiratory distress.     Breath sounds: Examination of the right-upper field reveals  wheezing. Examination of the left-upper field reveals wheezing. Examination of the right-middle field reveals wheezing. Examination of the left-middle field reveals wheezing. Examination of the right-lower field reveals wheezing. Examination of the left-lower field reveals wheezing. Wheezing and rales present.  Abdominal:     General: There is no distension.     Palpations: Abdomen is soft.     Tenderness: There is no abdominal tenderness.  Musculoskeletal:     Cervical back: Neck supple.     Right lower leg: Edema (3+) present.     Left lower leg: Edema (2+) present.  Skin:    General: Skin is warm.     Capillary Refill: Capillary refill takes less than 2 seconds.  Neurological:     Mental Status: She is alert and oriented to person, place, and time.     Motor: No abnormal muscle tone.       ____________________________________________   LABS (all labs ordered are listed, but only abnormal results are displayed)  Labs Reviewed  BASIC METABOLIC PANEL - Abnormal; Notable for the following components:      Result Value   Glucose, Bld 136 (*)    BUN 38 (*)    Calcium 8.5 (*)    GFR calc non Af Amer 56 (*)    All other components within normal limits  CBC - Abnormal; Notable for the following components:    Hemoglobin 10.3 (*)    HCT 35.6 (*)    MCV 77.9 (*)    MCH 22.5 (*)    MCHC 28.9 (*)    RDW 21.0 (*)    All other components within normal limits  SARS CORONAVIRUS 2 BY RT PCR (HOSPITAL ORDER, [place] [facility])  BRAIN NATRIURETIC PEPTIDE  HIV ANTIBODY (ROUTINE TESTING W REFLEX)  TROPONIN I (HIGH SENSITIVITY)  TROPONIN I (HIGH SENSITIVITY)    ____________________________________________  EKG: Sinus bradycardia with first-degree AV block.  Ventricular rate 56, QRS 160, QTc 426.  LVH.  Right bundle branch noted.  Lateral T wave changes.  No acute ST elevations. ________________________________________  RADIOLOGY All imaging, including plain films, CT scans, and ultrasounds, independently reviewed by me, and interpretations confirmed via formal radiology reads.  ED MD interpretation:   Chest x-ray: Cardiomegaly, central vascular congestion DVT: Negative  Official radiology report(s): US Venous Img Lower Unilateral Right  Result Date: 12/08/2019 CLINICAL DATA:  79 year old female with swelling EXAM: RIGHT LOWER EXTREMITY VENOUS DOPPLER ULTRASOUND TECHNIQUE: Gray-scale sonography with graded compression, as well as color Doppler and duplex ultrasound were performed to evaluate the lower extremity deep venous systems from the level of the common femoral vein and including the common femoral, femoral, profunda femoral, popliteal and calf veins including the posterior tibial, peroneal and gastrocnemius veins when visible. The superficial great saphenous vein was also interrogated. Spectral Doppler was utilized to evaluate flow at rest and with distal augmentation maneuvers in the common femoral, femoral and popliteal veins. COMPARISON:  None. FINDINGS: Contralateral Common Femoral Vein: Respiratory phasicity is normal and symmetric with the symptomatic side. No evidence of thrombus. Normal compressibility. Common Femoral Vein: No evidence of thrombus. Normal compressibility,  respiratory phasicity and response to augmentation. Saphenofemoral Junction: No evidence of thrombus. Normal compressibility and flow on color Doppler imaging. Profunda Femoral Vein: No evidence of thrombus. Normal compressibility and flow on color Doppler imaging. Femoral Vein: No evidence of thrombus. Normal compressibility, respiratory phasicity and response to augmentation. Popliteal Vein: No evidence of thrombus. Normal compressibility, respiratory phasicity and response to augmentation.  Calf Veins: No evidence of thrombus. Normal compressibility and flow on color Doppler imaging. Superficial Great Saphenous Vein: No evidence of thrombus. Normal compressibility and flow on color Doppler imaging. Other Findings:  Edema IMPRESSION: Sonographic survey of the right lower extremity negative for DVT Edema Electronically Signed   By: Corrie Mckusick D.O.   On: 12/08/2019 13:41   DG Chest Portable 1 View  Result Date: 12/08/2019 CLINICAL DATA:  Shortness of breath EXAM: PORTABLE CHEST 1 VIEW COMPARISON:  11/25/2019 chest radiograph and prior. FINDINGS: No pneumothorax or pleural effusion. Left basilar opacities, likely atelectasis. Cardiomegaly with central pulmonary vascular congestion, unchanged. Mild peribronchial thickening. Aortic arch atherosclerotic calcifications. No acute osseous abnormality. Bilateral glenohumeral osteoarthrosis. IMPRESSION: Cardiomegaly and central pulmonary vascular congestion, unchanged. Left basilar opacities, likely atelectasis. Chronic bronchitic changes. Electronically Signed   By: Primitivo Gauze M.D.   On: 12/08/2019 13:37    ____________________________________________  PROCEDURES   Procedure(s) performed (including Critical Care):  Procedures  ____________________________________________  INITIAL IMPRESSION / MDM / [place] / ED COURSE  As part of my medical decision making, I reviewed the following data within the [facility] notes reviewed and incorporated, Old chart reviewed, Notes from prior ED visits, and [place] Controlled Substance Database       *Karla Sanchez was evaluated in Emergency Department on 12/08/2019 for the symptoms described in the history of present illness. She was evaluated in the context of the global COVID-19 pandemic, which necessitated consideration that the patient might be at risk for infection with the SARS-CoV-2 virus that causes COVID-19. Institutional protocols and algorithms that pertain to the evaluation of patients at risk for COVID-19 are in a state of rapid change based on information released by regulatory bodies including the CDC and federal and state organizations. These policies and algorithms were followed during the patient's care in the ED.  Some ED evaluations and interventions may be delayed as a result of limited staffing during the pandemic.*     Medical Decision Making:  79 yo F here with acute on chronic SOB. Suspect COPD vs CHF exacerbation, with additional deconditioning 2/2 immobility. DVT study negative which is reassuring, though this should be watched given asymmetric edema. BNP added on. Admit to medicine. Steroids, inhaler given for now. Does not appear infectious on history, and COVID is pending.  ____________________________________________  FINAL CLINICAL IMPRESSION(S) / ED DIAGNOSES  Final diagnoses:  COPD exacerbation ([place])  Wheezing     MEDICATIONS GIVEN DURING THIS VISIT:  Medications  methylPREDNISolone sodium succinate (SOLU-MEDROL) 125 mg/2 mL injection 125 mg (has no administration in time range)  albuterol (PROVENTIL) (2.5 MG/3ML) 0.083% nebulizer solution 3 mL (has no administration in time range)  acetaminophen (TYLENOL) tablet 650 mg (has no administration in time range)  colchicine tablet 0.6 mg (has no administration in time range)  furosemide (LASIX) tablet 40 mg (has no administration in time range)  insulin glargine (LANTUS)  injection 15 Units (has no administration in time range)  polyethylene glycol (MIRALAX / GLYCOLAX) packet 17 g (has no administration in time range)  benzonatate (TESSALON) capsule 200 mg (has no administration in time range)  ipratropium-albuterol (DUONEB) 0.5-2.5 (3) MG/3ML nebulizer solution 3 mL (has no administration in time range)  enoxaparin (LOVENOX) injection 40 mg (has no administration in time range)  sodium chloride flush (NS) 0.9 % injection 3 mL (has no administration in time range)  sodium chloride flush (NS) 0.9 % injection 3 mL (has no administration in time  range)  0.9 %  sodium chloride infusion (has no administration in time range)  budesonide (PULMICORT) nebulizer solution 2 mg (has no administration in time range)     ED Discharge Orders    None       Note:  This document was prepared using Dragon voice recognition software and may include unintentional dictation errors.   Duffy Bruce, MD 12/08/19 [phone]

## 2019-12-08 NOTE — NC FL2 (Signed)
[place] LEVEL OF CARE SCREENING TOOL     IDENTIFICATION  Patient Name: Karla Sanchez Birthdate: 1941/02/22 Sex: female Admission Date (Current Location): 12/08/2019  [place] and [place] Number:  Engineering geologist and Address:  [facility], [address], [place], [place] [address]      Provider Number: Mamie Nick) [id number]  Attending Physician Name and Address:  Collier Bullock, MD  Relative Name and Phone Number:  Leland Johns [phone] DSS/APS case worker    Current Level of Care: Hospital Recommended Level of Care: [place] Prior Approval Number:    Date Approved/Denied:   PASRR Number: [id number]  Discharge Plan: Other (Comment) [facility] Room)    Current Diagnoses: Patient Active Problem List   Diagnosis Date Noted  . COPD with acute exacerbation ([place]) 12/08/2019  . Physical deconditioning 12/08/2019  . Acute respiratory failure with hypoxia ([place])   . Iron deficiency anemia   . Type 2 diabetes mellitus without complication, with long-term current use of insulin ([place])   . CHF exacerbation ([place]) 11/20/2019  . Acute on chronic diastolic (congestive) heart failure ([place]) 11/19/2019  . Acute pulmonary edema (HCC)   . Acute on chronic diastolic CHF (congestive heart failure) ([place]) 10/22/2019  . Cellulitis of right foot 10/22/2019  . Non compliance with medical treatment   . Chronic respiratory failure with hypoxia ([place])   . Hypertension   . Chronic kidney disease, stage 3b   . Acute on chronic respiratory failure with hypoxia ([place])   . Diarrhea   . COPD (chronic obstructive pulmonary disease) ([place]) 10/03/2019  . Obesity, Class III, BMI 40-49.9 (morbid obesity) ([place]) 10/03/2019  . Acute exacerbation of CHF (congestive heart failure) ([place]) 09/08/2019  . Acute CHF (congestive heart failure) ([place]) 09/07/2019  . COPD exacerbation ([place]) 07/09/2019  . Chronic diastolic CHF (congestive heart failure) ([place])  07/09/2019  . Type 2 diabetes mellitus with stage 3 chronic kidney disease ([place]) 07/09/2019  . Peripheral edema 07/09/2019  . Gout 07/09/2019  . Obesity, diabetes, and hypertension syndrome ([place]) 07/09/2019  . Anemia of chronic disease 07/09/2019    Orientation RESPIRATION BLADDER Height & Weight     Self, Time, Situation, Place  Normal Incontinent Weight: 220 lb (99.8 kg) Height:  5\' 6"  (167.6 cm)  BEHAVIORAL SYMPTOMS/MOOD NEUROLOGICAL BOWEL NUTRITION STATUS  Verbally abusive   Continent Diet  AMBULATORY STATUS COMMUNICATION OF NEEDS Skin   Limited Assist Verbally Normal                       Personal Care Assistance Level of Assistance  Bathing, Dressing, Feeding Bathing Assistance: Limited assistance Feeding assistance: Limited assistance Dressing Assistance: Limited assistance     Functional Limitations Info             SPECIAL CARE FACTORS FREQUENCY                       Contractures      Additional Factors Info                  Current Medications (12/08/2019):  This is the current hospital active medication list Current Facility-Administered Medications  Medication Dose Route Frequency Provider Last Rate Last Admin  . 0.9 %  sodium chloride infusion  250 mL Intravenous PRN Agbata, Tochukwu, MD      . acetaminophen (TYLENOL) tablet 650 mg  650 mg Oral Q6H PRN Agbata, Tochukwu, MD      .  albuterol (PROVENTIL) (2.5 MG/3ML) 0.083% nebulizer solution 3 mL  3 mL Inhalation Once Duffy Bruce, MD      . benzonatate (TESSALON) capsule 200 mg  200 mg Oral TID PRN Agbata, Tochukwu, MD      . budesonide (PULMICORT) nebulizer solution 2 mg  2 mg Nebulization BID Agbata, Tochukwu, MD      . colchicine tablet 0.6 mg  0.6 mg Oral Daily PRN Agbata, Tochukwu, MD      . enoxaparin (LOVENOX) injection 40 mg  40 mg Subcutaneous Q24H Agbata, Tochukwu, MD      . furosemide (LASIX) tablet 40 mg  40 mg Oral BID Agbata, Tochukwu, MD      . insulin glargine  (LANTUS) injection 15 Units  15 Units Subcutaneous QHS Agbata, Tochukwu, MD      . ipratropium-albuterol (DUONEB) 0.5-2.5 (3) MG/3ML nebulizer solution 3 mL  3 mL Nebulization Q6H PRN Agbata, Tochukwu, MD      . polyethylene glycol (MIRALAX / GLYCOLAX) packet 17 g  17 g Oral Daily PRN Agbata, Tochukwu, MD      . sodium chloride flush (NS) 0.9 % injection 3 mL  3 mL Intravenous Q12H Agbata, Tochukwu, MD      . sodium chloride flush (NS) 0.9 % injection 3 mL  3 mL Intravenous PRN Agbata, Tochukwu, MD       Current Outpatient Medications  Medication Sig Dispense Refill  . acetaminophen (TYLENOL) 325 MG tablet Take 650 mg by mouth every 6 (six) hours as needed for mild pain or fever.     Marland Kitchen albuterol (VENTOLIN HFA) 108 (90 Base) MCG/ACT inhaler Inhale 2 puffs into the lungs every 6 (six) hours as needed for wheezing or shortness of breath. 8 g 5  . benzonatate (TESSALON) 200 MG capsule Take 1 capsule (200 mg total) by mouth 3 (three) times daily as needed for cough. 20 capsule 0  . colchicine 0.6 MG tablet Take 0.6 mg by mouth daily as needed (acute gout flare).     . furosemide (LASIX) 40 MG tablet Take 40 mg by mouth 2 (two) times daily.    . hydrocortisone (ANUSOL-HC) 2.5 % rectal cream Place rectally 2 (two) times daily. 30 g 0  . insulin glargine (LANTUS) 100 UNIT/ML injection Inject 0.15 mLs (15 Units total) into the skin at bedtime. 10 mL 3  . metoprolol tartrate (LOPRESSOR) 50 MG tablet Take 0.5 tablets (25 mg total) by mouth 2 (two) times daily. 60 tablet 5  . polyethylene glycol (MIRALAX / GLYCOLAX) 17 g packet Take 17 g by mouth daily as needed for severe constipation. 14 each 0     Discharge Medications: Please see discharge summary for a list of discharge medications.  Relevant Imaging Results:  Relevant Lab Results:   Additional Information ss# [id number]  Adelene Amas, LCSWA

## 2019-12-08 NOTE — ED Notes (Signed)
Pharm called for Pulmicort after pt c/o of SOB  Pt sat up in bed with feet angled down as per request, c/o "being hot"

## 2019-12-08 NOTE — ED Notes (Signed)
Pt assist to toilet unable to sit up by self, used walker, pt peed on floor, chux was wet

## 2019-12-08 NOTE — ED Notes (Signed)
OT at bedside; pt refused to work with them at this time

## 2019-12-08 NOTE — H&P (Addendum)
History and Physical    Johnica Armwood HUT:[id number] DOB: 16-Apr-1940 DOA: 12/08/2019  PCP: Alfredia Client, MD   Patient coming from: Home  I have personally briefly reviewed patient's old medical records in [place]  Chief Complaint: Shortness of breath  HPI: Merlean Pizzini is a 79 y.o. female with medical history significant for chronic diastolic heart failure, COPD with chronic respiratory failure on 3L of oxygen continuous, type 2 diabetes mellitus and hypertension who presents to the emergency room for evaluation of  worsening dyspnea from her baseline for the last several days and swelling in her right leg for months. She also has a cough which is nonproductive. Patient has bilateral lower extremity swelling (Rt > Lt).  Patient is wheelchair-bound and has become increasingly weak and unable to transfer due to worsening shortness of breath.  Patient has been progressively weak since her last hospital discharge a couple weeks ago. Patient denies having any chest pain, no nausea, no vomiting, no abdominal pain, no changes in her bowel habits or any urinary symptoms. Labs show sodium of 141, potassium of 5.1, chloride of 102, Hco3 of 28, BUN 38, Creatinine 0.97, calcium 8.5, troponin 14, WBC 7.7, Hemoglobin 10.3, Hct 35.6, MCV 77.9, Plt 265 Chest x-ray reviewed by me shows cardiomegaly and central pulmonary vascular congestion, unchanged. Left basilar opacities, likely atelectasis. Chronic bronchitic changes. 12 Lead EKG reviewed by me shows sinus bradycardia with first degree AV block, RBBB, left axis deviation    ED Course: Patient is a 79 year old female with a history of COPD and chronic respiratory failure on 2 L of oxygen, history of chronic diastolic dysfunction CHF who presents to the ER for evaluation of worsening shortness of breath from her baseline associated with a nonproductive cough and wheezing.  Patient is wheelchair-bound and is unable to transfer from  her wheelchair due to shortness of breath. Patient received a dose of Solu-Medrol 125 mg IV x1 dose as well as bronchodilator treatment.  Patient will be admitted to the hospital for further evaluation  Review of Systems: As per HPI otherwise 10 point review of systems negative.    Past Medical History:  Diagnosis Date  . CHF (congestive heart failure) ([place])   . Colon cancer [facility])    s/p right and left colectomy 2020  . COPD (chronic obstructive pulmonary disease) ([place])   . Diabetes mellitus without complication ([place])   . Diastolic heart failure ([place])   . Gout   . Hypertension     Past Surgical History:  Procedure Laterality Date  . COLON SURGERY    . TUBAL LIGATION  1970     reports that she has never smoked. She has never used smokeless tobacco. She reports previous alcohol use. She reports that she does not use drugs.  Allergies  Allergen Reactions  . Codeine Nausea Only, Other (See Comments) and Swelling    Other Reaction: nausea and vomiting Other reaction(s): NAUSEA   . Other Other (See Comments)    Other reaction(s): SWELLING  Other reaction(s): MUSCLE PAIN Muscle aches Other reaction(s): MUSCLE PAIN Muscle aches Other reaction(s): MUSCLE PAIN Muscle aches   . Exenatide Nausea And Vomiting  . Levofloxacin Other (See Comments)    Other Reaction: sloughing of buccal mucosa Other reaction(s): OTHER Lost skin in mouth Patient states she can take Cipro without difficulty and has taken it many times in the past Other Reaction: sloughing of buccal mucosa   . Losartan Other (See Comments)    Weight gain Weight  gain   . Tetracyclines & Related Other (See Comments)    Family History  Problem Relation Age of Onset  . Lung cancer Father      Prior to Admission medications   Medication Sig Start Date End Date Taking? Authorizing Provider  acetaminophen (TYLENOL) 325 MG tablet Take 650 mg by mouth every 6 (six) hours as needed.    [provider]   albuterol (VENTOLIN HFA) 108 (90 Base) MCG/ACT inhaler Inhale 2 puffs into the lungs every 6 (six) hours as needed for wheezing or shortness of breath. 09/29/19   [name], FNP  allopurinol (ZYLOPRIM) 100 MG tablet Take by mouth.    [provider]  aspirin 81 MG EC tablet Take by mouth.    [provider]  benzonatate (TESSALON) 200 MG capsule Take 1 capsule (200 mg total) by mouth 3 (three) times daily as needed for cough. 11/23/19   Max Sane, MD  Cholecalciferol 25 MCG (1000 UT) capsule Take by mouth.    [provider]  cholestyramine (QUESTRAN) 4 g packet Take 4 g by mouth 3 (three) times daily with meals.  03/31/19   [provider]  clotrimazole-betamethasone (LOTRISONE) cream Apply 1 application topically 2 (two) times daily.    [provider]  colchicine 0.6 MG tablet Take 0.6 mg by mouth daily as needed.  04/13/19   [provider]  Docusate Sodium (DSS) 100 MG CAPS Take 1 capsule by mouth at bedtime.  02/23/18   [provider]  ferrous sulfate 325 (65 FE) MG tablet Take by mouth.    [provider]  furosemide (LASIX) 40 MG tablet Take 1 tablet (40 mg total) by mouth 2 (two) times daily. 10/30/19 11/29/19  Alma Friendly, MD  guaiFENesin (ROBITUSSIN) 100 MG/5ML liquid Take 200 mg by mouth 4 (four) times daily as needed for cough.    [provider]  hydrocortisone (ANUSOL-HC) 2.5 % rectal cream Place rectally 2 (two) times daily. 11/24/19   Max Sane, MD  insulin glargine (LANTUS) 100 UNIT/ML injection Inject 0.15 mLs (15 Units total) into the skin at bedtime. 07/13/19   Lorella Nimrod, MD  insulin lispro (HUMALOG) 100 UNIT/ML injection Inject into the skin 3 (three) times daily with meals.     [provider]  ipratropium-albuterol (DUONEB) 0.5-2.5 (3) MG/3ML SOLN Take 3 mLs by nebulization every 6 (six) hours as needed.    [provider]  metoprolol tartrate (LOPRESSOR) 50 MG tablet  Take 0.5 tablets (25 mg total) by mouth 2 (two) times daily. 10/30/19   Alma Friendly, MD  mirtazapine (REMERON) 15 MG tablet Take by mouth at bedtime.  10/25/18 11/29/19  [provider]  polyethylene glycol (MIRALAX / GLYCOLAX) 17 g packet Take 17 g by mouth daily as needed for severe constipation. 11/23/19   Max Sane, MD  senna-docusate (SENOKOT-S) 8.6-50 MG tablet Take 1 tablet by mouth daily. 02/23/18   [provider]  zolpidem (AMBIEN) 5 MG tablet Take by mouth. 02/23/18   [provider]    Physical Exam: Vitals:   12/07/19 1848 12/07/19 1904 12/07/19 2105 12/08/19 0536  BP:   (!) 118/50 (!) 150/47  Pulse:   (!) 54 (!) 55  Resp:   (!) 22 20  Temp:   97.8 F (36.6 C)   TempSrc:  Oral    SpO2:   93% 94%  Weight: 99.8 kg     Height: 5\' 6"  (1.676 m)  Vitals:   12/07/19 1848 12/07/19 1904 12/07/19 2105 12/08/19 0536  BP:   (!) 118/50 (!) 150/47  Pulse:   (!) 54 (!) 55  Resp:   (!) 22 20  Temp:   97.8 F (36.6 C)   TempSrc:  Oral    SpO2:   93% 94%  Weight: 99.8 kg     Height: 5\' 6"  (1.676 m)       Constitutional: NAD, alert and oriented x 3.  Appears comfortable and in no distress Eyes: PERRL, lids and conjunctivae pallor ENMT: Mucous membranes are dry.  Neck: normal, supple, no masses, no thyromegaly Respiratory: Diffuse wheezing, faint crackles. Normal respiratory effort. No accessory muscle use.  Cardiovascular: Bradycardia, no murmurs / rubs / gallops. 3+ extremity edema. 2+ pedal pulses. No carotid bruits.  Abdomen: no tenderness, no masses palpated. No hepatosplenomegaly. Bowel sounds positive.  Musculoskeletal: no clubbing / cyanosis. No joint deformity upper and lower extremities.  Skin: no rashes, lesions, ulcers.  Neurologic: No gross focal neurologic deficit.  Generalized weakness Psychiatric: Normal mood and flat affect.   Labs on Admission: I have personally reviewed following labs and imaging studies  CBC: Recent  Labs  Lab 12/07/19 1910  WBC 7.7  HGB 10.3*  HCT 35.6*  MCV 77.9*  PLT 300   Basic Metabolic Panel: Recent Labs  Lab 12/07/19 1910  NA 141  K 5.1  CL 102  CO2 28  GLUCOSE 136*  BUN 38*  CREATININE 0.97  CALCIUM 8.5*   GFR: Estimated Creatinine Clearance: 56.1 mL/min (by C-G formula based on SCr of 0.97 mg/dL). Liver Function Tests: No results for input(s): AST, ALT, ALKPHOS, BILITOT, PROT, ALBUMIN in the last 168 hours. No results for input(s): LIPASE, AMYLASE in the last 168 hours. No results for input(s): AMMONIA in the last 168 hours. Coagulation Profile: No results for input(s): INR, PROTIME in the last 168 hours. Cardiac Enzymes: No results for input(s): CKTOTAL, CKMB, CKMBINDEX, TROPONINI in the last 168 hours. BNP (last 3 results) No results for input(s): PROBNP in the last 8760 hours. HbA1C: No results for input(s): HGBA1C in the last 72 hours. CBG: No results for input(s): GLUCAP in the last 168 hours. Lipid Profile: No results for input(s): CHOL, HDL, LDLCALC, TRIG, CHOLHDL, LDLDIRECT in the last 72 hours. Thyroid Function Tests: No results for input(s): TSH, T4TOTAL, FREET4, T3FREE, THYROIDAB in the last 72 hours. Anemia Panel: No results for input(s): VITAMINB12, FOLATE, FERRITIN, TIBC, IRON, RETICCTPCT in the last 72 hours. Urine analysis:    Component Value Date/Time   COLORURINE STRAW (A) 07/30/2019 2204   APPEARANCEUR CLEAR (A) 07/30/2019 2204   LABSPEC 1.009 07/30/2019 2204   PHURINE 5.0 07/30/2019 [place] 07/30/2019 2204   HGBUR NEGATIVE 07/30/2019 2204   [place] 07/30/2019 2204   KETONESUR NEGATIVE 07/30/2019 2204   PROTEINUR 100 (A) 07/30/2019 2204   NITRITE NEGATIVE 07/30/2019 2204   LEUKOCYTESUR NEGATIVE 07/30/2019 2204    Radiological Exams on Admission: US Venous Img Lower Unilateral Right  Result Date: 12/08/2019 CLINICAL DATA:  79 year old female with swelling EXAM: RIGHT LOWER EXTREMITY VENOUS  DOPPLER ULTRASOUND TECHNIQUE: Gray-scale sonography with graded compression, as well as color Doppler and duplex ultrasound were performed to evaluate the lower extremity deep venous systems from the level of the common femoral vein and including the common femoral, femoral, profunda femoral, popliteal and calf veins including the posterior tibial, peroneal and gastrocnemius veins when visible. The superficial great saphenous vein was also interrogated. Spectral  Doppler was utilized to evaluate flow at rest and with distal augmentation maneuvers in the common femoral, femoral and popliteal veins. COMPARISON:  None. FINDINGS: Contralateral Common Femoral Vein: Respiratory phasicity is normal and symmetric with the symptomatic side. No evidence of thrombus. Normal compressibility. Common Femoral Vein: No evidence of thrombus. Normal compressibility, respiratory phasicity and response to augmentation. Saphenofemoral Junction: No evidence of thrombus. Normal compressibility and flow on color Doppler imaging. Profunda Femoral Vein: No evidence of thrombus. Normal compressibility and flow on color Doppler imaging. Femoral Vein: No evidence of thrombus. Normal compressibility, respiratory phasicity and response to augmentation. Popliteal Vein: No evidence of thrombus. Normal compressibility, respiratory phasicity and response to augmentation. Calf Veins: No evidence of thrombus. Normal compressibility and flow on color Doppler imaging. Superficial Great Saphenous Vein: No evidence of thrombus. Normal compressibility and flow on color Doppler imaging. Other Findings:  Edema IMPRESSION: Sonographic survey of the right lower extremity negative for DVT Edema Electronically Signed   By: Corrie Mckusick D.O.   On: 12/08/2019 13:41   DG Chest Portable 1 View  Result Date: 12/08/2019 CLINICAL DATA:  Shortness of breath EXAM: PORTABLE CHEST 1 VIEW COMPARISON:  11/25/2019 chest radiograph and prior. FINDINGS: No pneumothorax or  pleural effusion. Left basilar opacities, likely atelectasis. Cardiomegaly with central pulmonary vascular congestion, unchanged. Mild peribronchial thickening. Aortic arch atherosclerotic calcifications. No acute osseous abnormality. Bilateral glenohumeral osteoarthrosis. IMPRESSION: Cardiomegaly and central pulmonary vascular congestion, unchanged. Left basilar opacities, likely atelectasis. Chronic bronchitic changes. Electronically Signed   By: Primitivo Gauze M.D.   On: 12/08/2019 13:37    EKG: Independently reviewed.  Sinus bradycardia with first degree AV block, RBBB, left axis deviation    Assessment/Plan Principal Problem:   COPD with acute exacerbation (HCC) Active Problems:   Chronic diastolic CHF (congestive heart failure) (HCC)   Type 2 diabetes mellitus with stage 3 chronic kidney disease (HCC)   Anemia of chronic disease   Non compliance with medical treatment   Chronic respiratory failure with hypoxia (HCC)   Physical deconditioning    COPD with acute exacerbation and history of chronic respiratory failure Patient with a history of COPD who presents to the ER with complaints of worsening shortness of breath from her baseline associated with wheezing and a non productive cough . Patient received a dose of IV Solu-Medrol and duo nebs in the ER. Place patient on scheduled and as needed bronchodilator therapy Place patient on inhaled steroids  Continue oxygen supplementation at 3 L continuous which is patient's baseline   Chronic diastolic dysfunction CHF Last known LVEF 50 to 55% Continue with Lasix 40 mg BID Hold metoprolol since patient has bradycardia Patient not on ACE inhibitor or ARB due to allergy Maintain low-sodium diet   Diabetes mellitus type 2 with complications of stage III chronic kidney disease Continue long-acting insulin Place patient on sliding scale coverage Maintain consistent carbohydrate diet   Morbid obesity (BMI 35) Complicates  overall prognosis and care   Right lower extremity swelling Lower extremity venous Doppler is negative for DVT   Anemia of chronic disease H&H is stable   Physical deconditioning Secondary to multiple chronic medical problems Patient is wheelchair-bound and is usually able to transfer from her wheelchair to the bed but is unable to do that at this time She is willing to go to skilled nursing facility for short-term rehab which was recommended during her last hospitalization    Bradycardia Most likely secondary to metoprolol use Hold metoprolol for now Obtain TSH  DVT prophylaxis: Lovenox Code Status: Full code Family Communication: Greater than 50% of time was spent discussing plan of care with patient at the bedside.  She verbalizes understanding and agrees with the plan. Disposition Plan: Back to previous home environment Consults called: Physical Therapy/Case management    Deangelo Berns MD [facility]     12/08/2019, 1:46 PM

## 2019-12-08 NOTE — ED Notes (Signed)
Pt stated it felt like her cannula wasn't giving her O2 anymore. Checked and tank empty. Switched to new tank.

## 2019-12-08 NOTE — Progress Notes (Signed)
OT Cancellation Note  Patient Details Name: Karla Sanchez MRN: [id number] DOB: 1940/06/06   Cancelled Treatment:    Reason Eval/Treat Not Completed: Other (comment)  OT consult received, chart reviewed. Pt is wellknown to this service d/t previous admissions. Pt declines to participate in any aspect of OT evaluation at this time. Will f/u as able/pt agreeable for services. Note: pt reports thinking she will require d/c to a nursing home after hospital this time as she is weaker than before.   Gerrianne Scale, [place], OTR/L ascom [phone] 12/08/19, 4:58 PM

## 2019-12-09 ENCOUNTER — Encounter: Payer: Self-pay | Admitting: Internal Medicine

## 2019-12-09 ENCOUNTER — Other Ambulatory Visit: Payer: Self-pay

## 2019-12-09 LAB — CBC
HCT: 38.9 % (ref 36.0–46.0)
Hemoglobin: 11.2 g/dL — ABNORMAL LOW (ref 12.0–15.0)
MCH: 22.6 pg — ABNORMAL LOW (ref 26.0–34.0)
MCHC: 28.8 g/dL — ABNORMAL LOW (ref 30.0–36.0)
MCV: 78.4 fL — ABNORMAL LOW (ref 80.0–100.0)
Platelets: 278 10*3/uL (ref 150–400)
RBC: 4.96 MIL/uL (ref 3.87–5.11)
RDW: 21 % — ABNORMAL HIGH (ref 11.5–15.5)
WBC: 8.1 10*3/uL (ref 4.0–10.5)
nRBC: 0 % (ref 0.0–0.2)

## 2019-12-09 LAB — BASIC METABOLIC PANEL
Anion gap: 9 (ref 5–15)
BUN: 36 mg/dL — ABNORMAL HIGH (ref 8–23)
CO2: 30 mmol/L (ref 22–32)
Calcium: 8.8 mg/dL — ABNORMAL LOW (ref 8.9–10.3)
Chloride: 100 mmol/L (ref 98–111)
Creatinine, Ser: 1.03 mg/dL — ABNORMAL HIGH (ref 0.44–1.00)
GFR calc Af Amer: 60 mL/min — ABNORMAL LOW (ref >60)
GFR calc non Af Amer: 52 mL/min — ABNORMAL LOW (ref >60)
Glucose, Bld: 233 mg/dL — ABNORMAL HIGH (ref 70–99)
Potassium: 5.5 mmol/L — ABNORMAL HIGH (ref 3.5–5.1)
Sodium: 139 mmol/L (ref 135–145)

## 2019-12-09 LAB — TSH: TSH: 1.095 u[IU]/mL (ref 0.350–4.500)

## 2019-12-09 LAB — GLUCOSE, CAPILLARY: Glucose-Capillary: 258 mg/dL — ABNORMAL HIGH (ref 70–99)

## 2019-12-09 MED ORDER — SODIUM ZIRCONIUM CYCLOSILICATE 5 G PO PACK
5.0000 g | PACK | Freq: Two times a day (BID) | ORAL | Status: DC
  Filled 2019-12-09 (×2): qty 1

## 2019-12-09 MED ORDER — FUROSEMIDE 10 MG/ML IJ SOLN
40.0000 mg | Freq: Two times a day (BID) | INTRAMUSCULAR | Status: DC
  Administered 2019-12-09: 40 mg via INTRAVENOUS
  Filled 2019-12-09: qty 4

## 2019-12-09 MED ORDER — INFLUENZA VAC A&B SA ADJ QUAD 0.5 ML IM PRSY
0.5000 mL | PREFILLED_SYRINGE | INTRAMUSCULAR | Status: DC | PRN
  Filled 2019-12-09: qty 0.5

## 2019-12-09 MED ORDER — METHYLPREDNISOLONE SODIUM SUCC 125 MG IJ SOLR
60.0000 mg | Freq: Three times a day (TID) | INTRAMUSCULAR | Status: DC
  Administered 2019-12-09: 60 mg via INTRAVENOUS
  Filled 2019-12-09 (×2): qty 2

## 2019-12-09 NOTE — ED Notes (Signed)
Pt reports breathing treatment making wheeze worse and causing increased SOB, pt recovered inhaler from handbag and used meds and immediatly reports "that's better"

## 2019-12-09 NOTE — Progress Notes (Signed)
Dr. Mal Misty notified. Pt left AMA. RN attempted to give 1400 meds. Pt refused and said she wasn't going to take them and to get her a wheelchair. Pt reported that she needed to get outside in the air. Pt denied having a ride at that time but that she would call once she was downstairs. RN wheeled pt downstairs and [facility].

## 2019-12-09 NOTE — Progress Notes (Signed)
Progress Note    Karla Sanchez  LMB:[id number] DOB: 07-Oct-1940  DOA: 12/08/2019 PCP: Alfredia Client, MD      Brief Narrative:    Medical records reviewed and are as summarized below:  Karla Sanchez is a 79 y.o. female       Assessment/Plan:   Principal Problem:   COPD with acute exacerbation ([place]) Active Problems:   Chronic diastolic CHF (congestive heart failure) ([place])   Type 2 diabetes mellitus with stage 3 chronic kidney disease ([place])   Anemia of chronic disease   Non compliance with medical treatment   Chronic respiratory failure with hypoxia (H[facility])   Physical deconditioning  Hyperkalemia Sinus bradycardia, first-degree AV block-asymptomatic Right leg swelling (she attributes this to CHF)-2D echo in April 2021 showed EF of 55 to 60%, moderate LVH, grade 2 diastolic dysfunction History of colon cancer status post colectomy in January 2021  PLAN  Continue IV steroids and bronchodilators for COPD exacerbation  Start IV Lasix for exacerbation of chronic diastolic CHF.  Venous duplex of right lower extremity did not show any evidence of DVT.  Continue Lantus and NovoLog before DM  Start Lokelma for hyperkalemia.  She has CKD stage IIIa.  Monitor BMP closely.  Continue 3 L/min oxygen via nasal cannula for chronic hypoxemic respiratory failure.  Resume metoprolol for hypertension and monitor heart rate.  Body mass index is 35.51 kg/m.  (Morbid obesity)  Diet Order            Diet heart healthy/carb modified Room service appropriate? Yes; Fluid consistency: Thin  Diet effective now                    Consultants:  None  Procedures:  None    Medications:   . albuterol  3 mL Inhalation Once  . budesonide (PULMICORT) nebulizer solution  2 mg Nebulization BID  . enoxaparin (LOVENOX) injection  40 mg Subcutaneous Q24H  . furosemide  40 mg Intravenous BID  . insulin glargine  15 Units Subcutaneous QHS  . methylPREDNISolone  (SOLU-MEDROL) injection  60 mg Intravenous Q8H  . sodium chloride flush  3 mL Intravenous Q12H   Continuous Infusions: . sodium chloride       Anti-infectives (From admission, onward)   None             Family Communication/Anticipated D/C date and plan/Code Status   DVT prophylaxis: enoxaparin (LOVENOX) injection 40 mg Start: 12/08/19 1800     Code Status: Full Code  Family Communication: Plan discussed with patient Disposition Plan:    Status is: Inpatient  Remains inpatient appropriate because:IV treatments appropriate due to intensity of illness or inability to take PO and Inpatient level of care appropriate due to severity of illness   Dispo: The patient is from: Home              Anticipated d/c is to: SNF              Anticipated d/c date is: 2 days              Patient currently is not medically stable to d/c.           Subjective:   She complains of right leg swelling, wheezing,cough, SOB  Objective:    Vitals:   12/08/19 1643 12/09/19 0339 12/09/19 0426 12/09/19 0811  BP: (!) 151/56 (!) 138/92 (!) 158/73 137/71  Pulse: 76 88 80 70  Resp: 20 16 18 16   Temp:  97.7 F (36.5 C) 97.6 F (36.4 C) 98.1 F (36.7 C)  TempSrc:  Oral Oral Oral  SpO2: 96% 95% 96% 99%  Weight:      Height:       No data found.  No intake or output data in the 24 hours ending 12/09/19 1336 Filed Weights   12/07/19 1848  Weight: 99.8 kg    Exam:  GEN: NAD SKIN: Warm and dry EYES: EOMI ENT: MMM CV: RRR PULM: B/l wheezing.  Decreased air entry bilaterally.  No rales heard. ABD: soft, obese, NT, +BS CNS: AAO x 3, non focal EXT: Right leg edema, no tenderness   Data Reviewed:   I have personally reviewed following labs and imaging studies:  Labs: Labs show the following:   Basic Metabolic Panel: Recent Labs  Lab 12/07/19 1910 12/09/19 1026  NA 141 139  K 5.1 5.5*  CL 102 100  CO2 28 30  GLUCOSE 136* 233*  BUN 38* 36*  CREATININE 0.97  1.03*  CALCIUM 8.5* 8.8*   GFR Estimated Creatinine Clearance: 52.8 mL/min (A) (by C-G formula based on SCr of 1.03 mg/dL (H)). Liver Function Tests: No results for input(s): AST, ALT, ALKPHOS, BILITOT, PROT, ALBUMIN in the last 168 hours. No results for input(s): LIPASE, AMYLASE in the last 168 hours. No results for input(s): AMMONIA in the last 168 hours. Coagulation profile No results for input(s): INR, PROTIME in the last 168 hours.  CBC: Recent Labs  Lab 12/07/19 1910 12/09/19 1026  WBC 7.7 8.1  HGB 10.3* 11.2*  HCT 35.6* 38.9  MCV 77.9* 78.4*  PLT 265 278   Cardiac Enzymes: No results for input(s): CKTOTAL, CKMB, CKMBINDEX, TROPONINI in the last 168 hours. BNP (last 3 results) No results for input(s): PROBNP in the last 8760 hours. CBG: Recent Labs  Lab 12/09/19 0336  GLUCAP 258*   D-Dimer: No results for input(s): DDIMER in the last 72 hours. Hgb A1c: No results for input(s): HGBA1C in the last 72 hours. Lipid Profile: No results for input(s): CHOL, HDL, LDLCALC, TRIG, CHOLHDL, LDLDIRECT in the last 72 hours. Thyroid function studies: Recent Labs    12/08/19 06/23/36  TSH 1.095   Anemia work up: No results for input(s): VITAMINB12, FOLATE, FERRITIN, TIBC, IRON, RETICCTPCT in the last 72 hours. Sepsis Labs: Recent Labs  Lab 12/07/19 1910 12/09/19 1026  WBC 7.7 8.1    Microbiology Recent Results (from the past 240 hour(s))  SARS Coronavirus 2 by RT PCR (hospital order, performed in [facility] hospital lab) Nasopharyngeal Nasopharyngeal Swab     Status: None   Collection Time: 12/08/19  2:48 PM   Specimen: Nasopharyngeal Swab  Result Value Ref Range Status   SARS Coronavirus 2 NEGATIVE NEGATIVE Final    Comment: (NOTE) SARS-CoV-2 target nucleic acids are NOT DETECTED.  The SARS-CoV-2 RNA is generally detectable in upper and lower respiratory specimens during the acute phase of infection. The lowest concentration of SARS-CoV-2 viral copies this assay  can detect is 250 copies / mL. A negative result does not preclude SARS-CoV-2 infection and should not be used as the sole basis for treatment or other patient management decisions.  A negative result may occur with improper specimen collection / handling, submission of specimen other than nasopharyngeal swab, presence of viral mutation(s) within the areas targeted by this assay, and inadequate number of viral copies (<250 copies / mL). A negative result must be combined with clinical observations, patient history, and epidemiological information.  Fact Sheet for  Patients:   StrictlyIdeas.no  Fact Sheet for Healthcare Providers: BankingDealers.co.za  This test is not yet approved or  cleared by the [place] FDA and has been authorized for detection and/or diagnosis of SARS-CoV-2 by FDA under an Emergency Use Authorization (EUA).  This EUA will remain in effect (meaning this test can be used) for the duration of the COVID-19 declaration under Section 564(b)(1) of the Act, 21 U.S.C. section 360bbb-3(b)(1), unless the authorization is terminated or revoked sooner.  Performed at [facility], [address] [place], [place] [id number]     Procedures and diagnostic studies:  US Venous Img Lower Unilateral Right  Result Date: 12/08/2019 CLINICAL DATA:  79 year old female with swelling EXAM: RIGHT LOWER EXTREMITY VENOUS DOPPLER ULTRASOUND TECHNIQUE: Gray-scale sonography with graded compression, as well as color Doppler and duplex ultrasound were performed to evaluate the lower extremity deep venous systems from the level of the common femoral vein and including the common femoral, femoral, profunda femoral, popliteal and calf veins including the posterior tibial, peroneal and gastrocnemius veins when visible. The superficial great saphenous vein was also interrogated. Spectral Doppler was utilized to evaluate flow at rest and  with distal augmentation maneuvers in the common femoral, femoral and popliteal veins. COMPARISON:  None. FINDINGS: Contralateral Common Femoral Vein: Respiratory phasicity is normal and symmetric with the symptomatic side. No evidence of thrombus. Normal compressibility. Common Femoral Vein: No evidence of thrombus. Normal compressibility, respiratory phasicity and response to augmentation. Saphenofemoral Junction: No evidence of thrombus. Normal compressibility and flow on color Doppler imaging. Profunda Femoral Vein: No evidence of thrombus. Normal compressibility and flow on color Doppler imaging. Femoral Vein: No evidence of thrombus. Normal compressibility, respiratory phasicity and response to augmentation. Popliteal Vein: No evidence of thrombus. Normal compressibility, respiratory phasicity and response to augmentation. Calf Veins: No evidence of thrombus. Normal compressibility and flow on color Doppler imaging. Superficial Great Saphenous Vein: No evidence of thrombus. Normal compressibility and flow on color Doppler imaging. Other Findings:  Edema IMPRESSION: Sonographic survey of the right lower extremity negative for DVT Edema Electronically Signed   By: Corrie Mckusick D.O.   On: 12/08/2019 13:41   DG Chest Portable 1 View  Result Date: 12/08/2019 CLINICAL DATA:  Shortness of breath EXAM: PORTABLE CHEST 1 VIEW COMPARISON:  11/25/2019 chest radiograph and prior. FINDINGS: No pneumothorax or pleural effusion. Left basilar opacities, likely atelectasis. Cardiomegaly with central pulmonary vascular congestion, unchanged. Mild peribronchial thickening. Aortic arch atherosclerotic calcifications. No acute osseous abnormality. Bilateral glenohumeral osteoarthrosis. IMPRESSION: Cardiomegaly and central pulmonary vascular congestion, unchanged. Left basilar opacities, likely atelectasis. Chronic bronchitic changes. Electronically Signed   By: Primitivo Gauze M.D.   On: 12/08/2019 13:37                LOS: 1 day   Ketih Goodie  [facility]   Pager on [email or web address]. If 7PM-7AM, please contact night-coverage at [email or web address]     12/09/2019, 1:36 PM

## 2019-12-09 NOTE — TOC Transition Note (Signed)
Transition of Care [facility]) - CM/SW Discharge Note   Patient Details  Name: Karla Sanchez MRN: [id number] Date of Birth: 1940-06-02  [facility]) CM/SW Contact:  Meriel Flavors, LCSW Phone Number: 12/09/2019, 3:53 PM   Clinical Narrative:    CSW received a phone call from nurse supervisor on [name] requesting assistance with transportation for this patient because the patient chose to leave AMA, the nurse Olivia push patient down to front lobby in a chair. CSW informed Nurse supervisor TOC is unable to provide transportation for patient leaving hospital against medical advice.   Final next level of care: [place] Barriers to Discharge: Continued Medical Work up, ED Unsafe disposition, ED Uninsured needing medication assistance, ED Medication assistance   Patient Goals and CMS Choice Patient states their goals for this hospitalization and ongoing recovery are:: Patient has open APS/DSS case (336) [phone] Leland Johns, case worker CMS Medicare.gov Compare Post Acute Care list provided to:: Other (Comment Required) (APS/DSS [phone]) [phone] Leland Johns, case worker) Choice offered to / list presented to : NA  Discharge Placement                       Discharge Plan and Services In-house Referral: Clinical Social Work   Post Acute Care Choice: [place]                               Social Determinants of Health (SDOH) Interventions     Readmission Risk Interventions Readmission Risk Prevention Plan 10/24/2019  Medication Review (RN Care Manager) Complete  HRI or [place] Patient Refused

## 2019-12-09 NOTE — TOC Progression Note (Signed)
Transition of Care [facility]) - Progression Note    Patient Details  Name: Karla Sanchez MRN: [id number] Date of Birth: [birth year]  Transition of [facility]) CM/SW Contact  Zigmund Daniel Dorian Pod, RN Phone [phone] 12/09/2019, 12:09 PM  Clinical Narrative:    [facility] RN spoke with pt directly today and educated on Heart Failure related to the importance of daily weight, swelling with fluid retention and SOB. States she has some "puffiness" on the top of her feet but no her ankles. Pt states no swelling in the mornings upon awaiting only mid-day but she tries to elevate her LE as much as possible throughout the day.  Pt aware if she gains 3 lbs overnight or 5 lbs within one week to contact her provider for possible interventions. Also educated pt on COPD action plan as pt remains in the GREEN zone with no precipitating symptoms.  Pt states she has been speaking with DSS/APS related to L-TAC and moving to the [place] area for placement. Pt currently living in a hotel and has a car. States the front desk has her car keys and believes the agency (DSS) will obtain her belongings when she is accepted at a facility in [place], [place].  Pt states the individual list on her chart is a friend who lives in [place] with no local family.  TOC team will continue to follow up accordingly and work with the involved agency for placement on this pt.   Expected Discharge Plan: Skilled Nursing Facility Barriers to Discharge: Continued Medical Work up, ED Unsafe disposition, ED Uninsured needing medication assistance, ED Medication assistance  Expected Discharge Plan and Services Expected Discharge Plan: [place] In-house Referral: Clinical Social Work   Post Acute Care Choice: [place] Living arrangements for the past 2 months: Hotel/Motel                                       Social Determinants of Health (SDOH) Interventions    Readmission Risk  Interventions Readmission Risk Prevention Plan 10/24/2019  Medication Review (RN Care Manager) Complete  HRI or [place] Patient Refused

## 2019-12-09 NOTE — ED Notes (Signed)
First call report

## 2019-12-09 NOTE — Evaluation (Signed)
Physical Therapy Evaluation Patient Details Name: Karla Sanchez MRN: [id number] DOB: March 29, 1940 Today's Date: 12/09/2019   History of Present Illness  79 y.o. female was admitted with RLE edema and SOB, both worsening for several days.  At baseline uses wheelchair but is mod I to transfer, and lives in a hotel.  PMHx:  COPD, CHF, gout, DM, HTN, frequent trips to hosp, hypoxia on 3L O2 baseline   Clinical Impression  Pt was seen for initial findings and noted her urine incontinence with pt being unaware until she sat up on side of bed.  Pt is home in a hotel situation, talking about returning to [facility] but is not clear if this is an option.  Pt will be seen for mobility and strengthening as she will allow, and follow up with home therapy if this is available.  If not may need to be referred to outpatient treatment.    Follow Up Recommendations Home health PT    Equipment Recommendations  None recommended by PT    Recommendations for Other Services       Precautions / Restrictions Precautions Precautions: Fall Precaution Comments: monitor O2 sats Restrictions Weight Bearing Restrictions: No      Mobility  Bed Mobility Overal bed mobility: Modified Independent             General bed mobility comments: pt up to chair pre/post session  Transfers Overall transfer level: Needs assistance Equipment used: None Transfers: Sit to/from Stand;Stand Pivot Transfers Sit to Stand: Supervision Stand pivot transfers: Min guard (for safety and lines)       General transfer comment: declines to participate in ADL transfer assessment on OT evaluation  Ambulation/Gait             General Gait Details: pt reports she has been advised not to do any walking due to fragile bones in feet being at risk of fracture  Stairs            Wheelchair Mobility    Modified Rankin (Stroke Patients Only)       Balance Overall balance assessment: Needs  assistance Sitting-balance support: Feet supported Sitting balance-Leahy Scale: Good     Standing balance support: Single extremity supported;Bilateral upper extremity supported;During functional activity Standing balance-Leahy Scale: Fair                               Pertinent Vitals/Pain Pain Assessment: Faces Faces Pain Scale: Hurts little more Pain Location: low back Pain Descriptors / Indicators: Discomfort;Sore Pain Intervention(s): Monitored during session    Home Living Family/patient expects to be discharged to:: Unsure Living Arrangements: Alone   Type of Home: Other(Comment) (hotel) Home Access: Level entry     Home Layout: One level Home Equipment: Wheelchair - manual Additional Comments: has been living in hotel recently but discusses going back to [place]    Prior Function Level of Independence: Needs assistance   Gait / Transfers Assistance Needed: Uses heels of B feet to propel self in manual w/c (doesn't use hands to propel w/c d/t h/o hand issues); has power w/c but is in the "shop" and prefers to use manual w/c.  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 and can only take 2 steps  for transfers; does not walk d/t this and does not stand for any length of time d/t this--pt reports it will "crush" the bones in her feet)     Hand Dominance   Dominant Hand: Right    Extremity/Trunk Assessment   Upper Extremity Assessment Upper Extremity Assessment: Generalized weakness    Lower Extremity Assessment Lower Extremity Assessment: Defer to PT evaluation    Cervical / Trunk Assessment Cervical / Trunk Assessment: Kyphotic  Communication   Communication: No difficulties  Cognition  Arousal/Alertness: Awake/alert Behavior During Therapy: WFL for tasks assessed/performed Overall Cognitive Status: Within Functional Limits for tasks assessed                                        General Comments General comments (skin integrity, edema, etc.): pt is careful to place hands in supportive and helpful ways to transfer to recliner from bed for getting bed changed and getting her cleaned up    Exercises Other Exercises Other Exercises: OT facilitates ed re: importance of OOB activity, safety considerations, and grip exercise while seated to maintain strength. Pt with good understanding   Assessment/Plan    PT Assessment Patient needs continued PT services  PT Problem List Decreased strength;Decreased range of motion;Decreased activity tolerance;Decreased balance;Decreased mobility;Decreased knowledge of use of DME;Cardiopulmonary status limiting activity       PT Treatment Interventions DME instruction;Gait training;Functional mobility training;Therapeutic activities;Therapeutic exercise;Balance training;Neuromuscular re-education;Patient/family education    PT Goals (Current goals can be found in the Care Plan section)  Acute Rehab PT Goals Patient Stated Goal: home to [facility] after rehab PT Goal Formulation: With patient Time For Goal Achievement: 12/16/19 Potential to Achieve Goals: Good    Frequency Min 2X/week   Barriers to discharge Decreased caregiver support home in hotel alone    Co-evaluation               AM-PAC PT "6 Clicks" Mobility  Outcome Measure Help needed turning from your back to your side while in a flat bed without using bedrails?: None Help needed moving from lying on your back to sitting on the side of a flat bed without using bedrails?: A Little Help needed moving to and from a bed to a chair (including a wheelchair)?: A Little Help needed standing up from a chair using your arms (e.g., wheelchair or bedside  chair)?: A Little Help needed to walk in hospital room?: A Little Help needed climbing 3-5 steps with a railing? : A Lot 6 Click Score: 18    End of Session Equipment Utilized During Treatment: Oxygen Activity Tolerance: Patient tolerated treatment well;Treatment limited secondary to medical complications (Comment) Patient left: in chair;with call bell/phone within reach;with nursing/sitter in room Nurse Communication: Mobility status PT Visit Diagnosis: Unsteadiness on feet (R26.81);Muscle weakness (generalized) (M62.81)    Time: 8592-9244 PT Time Calculation (min) (ACUTE ONLY): 26 min   Charges:   PT Evaluation $PT Eval Moderate Complexity: 1 Mod PT Treatments $Therapeutic Activity: 8-22 mins       Ramond Dial 12/09/2019, 2:21 PM  Mee Hives, PT MS Acute Rehab Dept. Number: [place] and [place]

## 2019-12-09 NOTE — Evaluation (Signed)
Occupational Therapy Evaluation Patient Details Name: Karla Sanchez MRN: [id number] DOB: 02-Apr-1940 Today's Date: 12/09/2019    History of Present Illness 79 y.o. female was admitted with RLE edema and SOB, both worsening for several days.  At baseline uses wheelchair but is mod I to transfer, and lives in a hotel.  PMHx:  COPD, CHF, gout, DM, HTN, frequent trips to hosp, hypoxia on 3L O2 baseline    Clinical Impression   Pt was seen for OT evaluation this date. Prior to hospital admission, pt was living in hotel alone and MOD I for ADLs/ADL mobility using w/c and only performing SPS transfers, no ambulation, performs fxl mobility with w/c. Currently pt demonstrates impairments as described below (See OT problem list) which functionally limit her ability to perform ADL/self-care tasks. Pt currently requires increased time for all seated ADLs and SETUP and reports feeling unable to trial transfers on OT assessment because she already got OOB today and is too tired.  Pt would benefit from skilled OT services to address noted impairments and functional limitations (see below for any additional details) in order to maximize safety and independence while minimizing falls risk and caregiver burden. Upon hospital discharge, recommend STR to maximize pt safety and return to PLOF.     Follow Up Recommendations  SNF    Equipment Recommendations  Other (comment) (pt refuses further equipment when discussed.)    Recommendations for Other Services       Precautions / Restrictions Precautions Precautions: Fall Precaution Comments: monitor O2 sats Restrictions Weight Bearing Restrictions: No      Mobility Bed Mobility             General bed mobility comments: pt up to chair pre/post session  Transfers       General transfer comment: declines to participate in ADL transfer assessment on OT evaluation    Balance Overall balance assessment: Needs assistance Sitting-balance support:  Feet supported Sitting balance-Leahy Scale: Good                                 ADL either performed or assessed with clinical judgement   ADL Overall ADL's : Needs assistance/impaired                                       General ADL Comments: Requires extended time for LB dressing chair level, declines to attempt sit to stand to simulate clothing mgt, performs seated ADLs with setup as she does not have tolerance to gather ADL items I'ly/MOD I.     Vision Baseline Vision/History: Wears glasses Wears Glasses: At all times Patient Visual Report: No change from baseline       Perception     Praxis      Pertinent Vitals/Pain Pain Assessment: Faces Faces Pain Scale: Hurts little more Pain Location: low back Pain Descriptors / Indicators: Discomfort;Sore Pain Intervention(s): Monitored during session     Hand Dominance Right   Extremity/Trunk Assessment Upper Extremity Assessment Upper Extremity Assessment: Generalized weakness   Lower Extremity Assessment Lower Extremity Assessment: Defer to PT evaluation   Cervical / Trunk Assessment Cervical / Trunk Assessment: Kyphotic   Communication Communication Communication: No difficulties   Cognition Arousal/Alertness: Awake/alert Behavior During Therapy: WFL for tasks assessed/performed Overall Cognitive Status: Within Functional Limits for tasks assessed  General Comments  pt is careful to place hands in supportive and helpful ways to transfer to recliner from bed for getting bed changed and getting her cleaned up    Exercises  Other Exercises: OT facilitates ed re: importance of OOB activity, safety considerations, and grip exercise while seated to maintain strength. Pt with good understanding   Shoulder Instructions      Home Living Family/patient expects to be discharged to:: Unsure Living Arrangements: Alone   Type of Home:  Other(Comment) (hotel) Home Access: Level entry     Home Layout: One level         Bathroom Toilet: Standard     Home Equipment: Wheelchair - manual   Additional Comments: has been living in hotel recently but discusses going back to [place]      Prior Functioning/Environment Level of Independence: Needs assistance  Gait / Transfers Assistance Needed: Uses heels of B feet to propel self in manual w/c (doesn't use hands to propel w/c d/t h/o hand issues); has power w/c but is in the "shop" and prefers to use manual w/c. 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 and can only take 2 steps for transfers; does not walk d/t this and does not stand for any length of time d/t this--pt reports it will "crush" the bones in her feet)        OT Problem List: Decreased strength;Decreased activity tolerance;Pain      OT Treatment/Interventions: Self-care/ADL training;Therapeutic exercise;DME and/or AE instruction;Therapeutic activities;Balance training;Patient/family education;Energy conservation    OT Goals(Current goals can be found in the care plan section) Acute Rehab OT Goals Patient Stated Goal: home to [facility] after rehab OT Goal Formulation: With patient Time For Goal Achievement: 12/23/19 Potential to Achieve Goals: Good ADL Goals Pt Will Perform Upper Body Dressing: with modified independence;sitting Pt Will Perform Lower Body Dressing: with supervision;sit to/from stand Pt Will Transfer to Toilet: with supervision;stand pivot transfer;bedside commode  OT Frequency: Min 1X/week   Barriers to D/C: Other (comment)  pt living alone in a hotel, she reports believing she needs SNF, wants to be closer to home in [place]        Co-evaluation              AM-PAC OT "6 Clicks" Daily Activity     Outcome Measure Help from another person eating meals?: None Help from another person taking care of personal grooming?: A Little Help from another person toileting, which includes using toliet, bedpan, or urinal?: A Little Help from another person bathing (including washing, rinsing, drying)?: A Little Help from another person to put on and taking off regular upper body clothing?: None Help from another person to put on and taking off regular lower body clothing?: A Little 6 Click Score: 20   End of Session Equipment Utilized During Treatment: Oxygen  Activity Tolerance: Patient tolerated treatment well Patient left: in chair;with call bell/phone within reach  OT Visit Diagnosis: Unsteadiness on feet (R26.81);Muscle weakness (generalized) (M62.81)                Time: 1212-1227 OT Time Calculation (min): 15 min Charges:  OT General Charges $OT Visit: 1 Visit OT Evaluation $OT Eval Moderate Complexity: Elsa, MS, OTR/L ascom [phone] 12/09/19, 2:25 PM

## 2019-12-11 ENCOUNTER — Telehealth: Payer: Self-pay

## 2019-12-11 NOTE — Telephone Encounter (Signed)
Requesting update on patient status and potential discharge to SNF. RN CM updated patient had left AMA on 12/09/19. Faxed Fl2 to [facility] @ [facility] 336 [phone] for SNF placement from the community.

## 2019-12-11 NOTE — Discharge Summary (Signed)
I was notified by [name], RN that patient had left the hospital Karla Sanchez

## 2019-12-12 ENCOUNTER — Telehealth (HOSPITAL_COMMUNITY): Payer: Self-pay

## 2019-12-12 NOTE — Telephone Encounter (Signed)
Contacted Karla Sanchez to check on her.  She was very short and rude.  She is talking in full sentences and appears not to be short of breath.  She states she has her [name] running and she is driving it back to [employer].  She states she can not talk because she has someone in her room and hung up.  Will try to contact at a later date.   [place] [facility] [phone]

## 2019-12-14 NOTE — Progress Notes (Signed)
12/14/2019 - CSW received call from [place] [phone], requesting FL2 for patient. Faxed to [phone] and [phone].

## 2019-12-19 ENCOUNTER — Other Ambulatory Visit: Payer: Self-pay | Admitting: Family

## 2019-12-19 ENCOUNTER — Other Ambulatory Visit (HOSPITAL_COMMUNITY): Payer: Self-pay

## 2019-12-19 ENCOUNTER — Encounter (HOSPITAL_COMMUNITY): Payer: Self-pay

## 2019-12-19 MED ORDER — FUROSEMIDE 40 MG PO TABS
40.0000 mg | ORAL_TABLET | Freq: Two times a day (BID) | ORAL | 2 refills | Status: DC
Start: 2019-12-19 — End: 2019-12-19

## 2019-12-19 MED ORDER — FUROSEMIDE 40 MG PO TABS
40.0000 mg | ORAL_TABLET | Freq: Two times a day (BID) | ORAL | 2 refills | Status: DC
Start: 2019-12-19 — End: 2020-01-09

## 2019-12-19 NOTE — Progress Notes (Signed)
Today visited with Karla Sanchez at where she is staying at the [facility].  She appears to be doing ok.  Her legs has edema, right is worse.  (3 plus edema).  She states she has shortness of breath with exertion, getting worse daily.  She has her oxygen on, lungs has rhonchi in lower.  Wheezes heard upper.  She has all her medications but she will be running out of lasix in about 4 days.  Will contact HF clinic to advise.  She has not yet had an in person visit with no doctors in this [place], she has been here since April.  Attempted to get her transportation to HF clinic and all times transportation was messed up.  Will work on getting her appt with PCP, she has missed one in the past, she would like [facility].  She has everything for daily living.  She does not elevate legs, she has no way to unless she lays in the bed.  She does not wear socks or compression hose.  She is wearing her oxygen.  Will continue to visit heart failure and provide support to her.    [place] [phone]

## 2020-01-03 ENCOUNTER — Telehealth (HOSPITAL_COMMUNITY): Payer: Self-pay

## 2020-01-03 ENCOUNTER — Other Ambulatory Visit: Payer: Self-pay

## 2020-01-03 ENCOUNTER — Emergency Department: Payer: Medicare Other

## 2020-01-03 ENCOUNTER — Encounter: Payer: Self-pay | Admitting: *Deleted

## 2020-01-03 DIAGNOSIS — Z881 Allergy status to other antibiotic agents status: Secondary | ICD-10-CM

## 2020-01-03 DIAGNOSIS — Z6835 Body mass index (BMI) 35.0-35.9, adult: Secondary | ICD-10-CM

## 2020-01-03 DIAGNOSIS — Z79899 Other long term (current) drug therapy: Secondary | ICD-10-CM

## 2020-01-03 DIAGNOSIS — Z885 Allergy status to narcotic agent status: Secondary | ICD-10-CM

## 2020-01-03 DIAGNOSIS — I4510 Unspecified right bundle-branch block: Secondary | ICD-10-CM | POA: Diagnosis present

## 2020-01-03 DIAGNOSIS — I493 Ventricular premature depolarization: Secondary | ICD-10-CM | POA: Diagnosis present

## 2020-01-03 DIAGNOSIS — J441 Chronic obstructive pulmonary disease with (acute) exacerbation: Secondary | ICD-10-CM | POA: Diagnosis present

## 2020-01-03 DIAGNOSIS — L03315 Cellulitis of perineum: Secondary | ICD-10-CM | POA: Diagnosis present

## 2020-01-03 DIAGNOSIS — E1122 Type 2 diabetes mellitus with diabetic chronic kidney disease: Secondary | ICD-10-CM | POA: Diagnosis present

## 2020-01-03 DIAGNOSIS — Z5901 Sheltered homelessness: Secondary | ICD-10-CM

## 2020-01-03 DIAGNOSIS — J9611 Chronic respiratory failure with hypoxia: Secondary | ICD-10-CM | POA: Diagnosis present

## 2020-01-03 DIAGNOSIS — F5104 Psychophysiologic insomnia: Secondary | ICD-10-CM | POA: Diagnosis present

## 2020-01-03 DIAGNOSIS — E538 Deficiency of other specified B group vitamins: Secondary | ICD-10-CM | POA: Diagnosis present

## 2020-01-03 DIAGNOSIS — R5381 Other malaise: Secondary | ICD-10-CM | POA: Diagnosis present

## 2020-01-03 DIAGNOSIS — Z9049 Acquired absence of other specified parts of digestive tract: Secondary | ICD-10-CM

## 2020-01-03 DIAGNOSIS — E1140 Type 2 diabetes mellitus with diabetic neuropathy, unspecified: Secondary | ICD-10-CM | POA: Diagnosis present

## 2020-01-03 DIAGNOSIS — I5032 Chronic diastolic (congestive) heart failure: Secondary | ICD-10-CM | POA: Diagnosis present

## 2020-01-03 DIAGNOSIS — B379 Candidiasis, unspecified: Secondary | ICD-10-CM | POA: Diagnosis present

## 2020-01-03 DIAGNOSIS — Z85038 Personal history of other malignant neoplasm of large intestine: Secondary | ICD-10-CM

## 2020-01-03 DIAGNOSIS — Z8616 Personal history of COVID-19: Secondary | ICD-10-CM

## 2020-01-03 DIAGNOSIS — L03115 Cellulitis of right lower limb: Secondary | ICD-10-CM | POA: Diagnosis not present

## 2020-01-03 DIAGNOSIS — R413 Other amnesia: Secondary | ICD-10-CM | POA: Diagnosis present

## 2020-01-03 DIAGNOSIS — R531 Weakness: Secondary | ICD-10-CM | POA: Diagnosis present

## 2020-01-03 DIAGNOSIS — Z801 Family history of malignant neoplasm of trachea, bronchus and lung: Secondary | ICD-10-CM

## 2020-01-03 DIAGNOSIS — Z794 Long term (current) use of insulin: Secondary | ICD-10-CM

## 2020-01-03 DIAGNOSIS — Z5900 Homelessness unspecified: Secondary | ICD-10-CM

## 2020-01-03 DIAGNOSIS — N1832 Chronic kidney disease, stage 3b: Secondary | ICD-10-CM | POA: Diagnosis present

## 2020-01-03 DIAGNOSIS — D631 Anemia in chronic kidney disease: Secondary | ICD-10-CM | POA: Diagnosis present

## 2020-01-03 DIAGNOSIS — I130 Hypertensive heart and chronic kidney disease with heart failure and stage 1 through stage 4 chronic kidney disease, or unspecified chronic kidney disease: Secondary | ICD-10-CM | POA: Diagnosis present

## 2020-01-03 DIAGNOSIS — Z7982 Long term (current) use of aspirin: Secondary | ICD-10-CM

## 2020-01-03 DIAGNOSIS — Z888 Allergy status to other drugs, medicaments and biological substances status: Secondary | ICD-10-CM

## 2020-01-03 DIAGNOSIS — M109 Gout, unspecified: Secondary | ICD-10-CM | POA: Diagnosis present

## 2020-01-03 DIAGNOSIS — Z9981 Dependence on supplemental oxygen: Secondary | ICD-10-CM

## 2020-01-03 DIAGNOSIS — B369 Superficial mycosis, unspecified: Secondary | ICD-10-CM | POA: Diagnosis present

## 2020-01-03 DIAGNOSIS — Z993 Dependence on wheelchair: Secondary | ICD-10-CM

## 2020-01-03 DIAGNOSIS — M545 Low back pain, unspecified: Secondary | ICD-10-CM | POA: Diagnosis present

## 2020-01-03 DIAGNOSIS — F4320 Adjustment disorder, unspecified: Secondary | ICD-10-CM | POA: Diagnosis present

## 2020-01-03 LAB — BASIC METABOLIC PANEL
Anion gap: 14 (ref 5–15)
BUN: 38 mg/dL — ABNORMAL HIGH (ref 8–23)
CO2: 27 mmol/L (ref 22–32)
Calcium: 8.4 mg/dL — ABNORMAL LOW (ref 8.9–10.3)
Chloride: 102 mmol/L (ref 98–111)
Creatinine, Ser: 1.08 mg/dL — ABNORMAL HIGH (ref 0.44–1.00)
GFR, Estimated: 49 mL/min — ABNORMAL LOW (ref >60)
Glucose, Bld: 201 mg/dL — ABNORMAL HIGH (ref 70–99)
Potassium: 4.2 mmol/L (ref 3.5–5.1)
Sodium: 143 mmol/L (ref 135–145)

## 2020-01-03 LAB — CBC
HCT: 38.1 % (ref 36.0–46.0)
Hemoglobin: 11.5 g/dL — ABNORMAL LOW (ref 12.0–15.0)
MCH: 23.4 pg — ABNORMAL LOW (ref 26.0–34.0)
MCHC: 30.2 g/dL (ref 30.0–36.0)
MCV: 77.4 fL — ABNORMAL LOW (ref 80.0–100.0)
Platelets: 315 10*3/uL (ref 150–400)
RBC: 4.92 MIL/uL (ref 3.87–5.11)
RDW: 21 % — ABNORMAL HIGH (ref 11.5–15.5)
WBC: 12.8 10*3/uL — ABNORMAL HIGH (ref 4.0–10.5)
nRBC: 0 % (ref 0.0–0.2)

## 2020-01-03 LAB — TROPONIN I (HIGH SENSITIVITY): Troponin I (High Sensitivity): 17 ng/L (ref <18)

## 2020-01-03 NOTE — ED Triage Notes (Signed)
Pt to triage via wheelchair.  Pt has right leg swelling and pain for  6weeks.  Redness noted.  Pt also has sob.  Pt is on oxygen 2 liters as needed at home.  Pt has abscess to left labia.  Sx for 3-4 months.  Pt alert  Speech clear.

## 2020-01-03 NOTE — ED Triage Notes (Signed)
[facility]; reports that pt was "ran out of gas and called ambulance so she could get seen faster for her leg pain"; pt to lobby via w/c with no distress

## 2020-01-03 NOTE — Telephone Encounter (Signed)
Received a phone call from [place] advising she was at a [employer] tired and lost.  She is on [employer].  She states left [place] and went to [place].  They sent her to a nursing home and it was filthy, she left there today and came back to [place].  She states the [facility] she had been staying at told her they had no room and all the hotels in [place] was booked.  She states her legs are swollen and needs to go to hospital.  Gave her directions to [facility] and she advised will let 5:00 rush hour die down then try to drive to the hospital.  She states been driving around all day.  Her license is expired and she does not want to park her [name] where it will get towed.  All her belongings is in her [name].  Talked with her for awhile, she talking about she needs to go to a nursing home but she wants a nice one.  She does not want cops called because she does not want to get in trouble.  She appears to know what is going on and can answer questions appropriately.    [place] [phone]

## 2020-01-04 ENCOUNTER — Emergency Department: Payer: Medicare Other

## 2020-01-04 ENCOUNTER — Encounter: Payer: Self-pay | Admitting: Radiology

## 2020-01-04 ENCOUNTER — Inpatient Hospital Stay
Admission: EM | Admit: 2020-01-04 | Discharge: 2020-01-15 | DRG: 603 | Disposition: A | Discharge status: Home / Self Care | Payer: Medicare Other | Attending: Internal Medicine | Admitting: Internal Medicine

## 2020-01-04 DIAGNOSIS — R0602 Shortness of breath: Secondary | ICD-10-CM

## 2020-01-04 DIAGNOSIS — J441 Chronic obstructive pulmonary disease with (acute) exacerbation: Secondary | ICD-10-CM | POA: Diagnosis present

## 2020-01-04 DIAGNOSIS — Z794 Long term (current) use of insulin: Secondary | ICD-10-CM | POA: Diagnosis not present

## 2020-01-04 DIAGNOSIS — N183 Chronic kidney disease, stage 3 unspecified: Secondary | ICD-10-CM | POA: Diagnosis present

## 2020-01-04 DIAGNOSIS — E669 Obesity, unspecified: Secondary | ICD-10-CM | POA: Diagnosis not present

## 2020-01-04 DIAGNOSIS — N1831 Chronic kidney disease, stage 3a: Secondary | ICD-10-CM | POA: Diagnosis not present

## 2020-01-04 DIAGNOSIS — Z5900 Homelessness unspecified: Secondary | ICD-10-CM | POA: Diagnosis not present

## 2020-01-04 DIAGNOSIS — R531 Weakness: Secondary | ICD-10-CM

## 2020-01-04 DIAGNOSIS — M109 Gout, unspecified: Secondary | ICD-10-CM | POA: Diagnosis present

## 2020-01-04 DIAGNOSIS — E119 Type 2 diabetes mellitus without complications: Secondary | ICD-10-CM | POA: Diagnosis present

## 2020-01-04 DIAGNOSIS — Z8616 Personal history of COVID-19: Secondary | ICD-10-CM | POA: Diagnosis not present

## 2020-01-04 DIAGNOSIS — Z885 Allergy status to narcotic agent status: Secondary | ICD-10-CM | POA: Diagnosis not present

## 2020-01-04 DIAGNOSIS — N1832 Chronic kidney disease, stage 3b: Secondary | ICD-10-CM

## 2020-01-04 DIAGNOSIS — R413 Other amnesia: Secondary | ICD-10-CM | POA: Diagnosis present

## 2020-01-04 DIAGNOSIS — I130 Hypertensive heart and chronic kidney disease with heart failure and stage 1 through stage 4 chronic kidney disease, or unspecified chronic kidney disease: Secondary | ICD-10-CM | POA: Diagnosis present

## 2020-01-04 DIAGNOSIS — L03115 Cellulitis of right lower limb: Principal | ICD-10-CM | POA: Diagnosis present

## 2020-01-04 DIAGNOSIS — E1122 Type 2 diabetes mellitus with diabetic chronic kidney disease: Secondary | ICD-10-CM | POA: Diagnosis present

## 2020-01-04 DIAGNOSIS — R5381 Other malaise: Secondary | ICD-10-CM | POA: Diagnosis present

## 2020-01-04 DIAGNOSIS — Z993 Dependence on wheelchair: Secondary | ICD-10-CM | POA: Diagnosis not present

## 2020-01-04 DIAGNOSIS — D631 Anemia in chronic kidney disease: Secondary | ICD-10-CM | POA: Diagnosis present

## 2020-01-04 DIAGNOSIS — I5032 Chronic diastolic (congestive) heart failure: Secondary | ICD-10-CM | POA: Diagnosis present

## 2020-01-04 DIAGNOSIS — Z888 Allergy status to other drugs, medicaments and biological substances status: Secondary | ICD-10-CM | POA: Diagnosis not present

## 2020-01-04 DIAGNOSIS — J9611 Chronic respiratory failure with hypoxia: Secondary | ICD-10-CM | POA: Diagnosis present

## 2020-01-04 DIAGNOSIS — Z5901 Sheltered homelessness: Secondary | ICD-10-CM | POA: Diagnosis not present

## 2020-01-04 DIAGNOSIS — E538 Deficiency of other specified B group vitamins: Secondary | ICD-10-CM

## 2020-01-04 DIAGNOSIS — L03315 Cellulitis of perineum: Secondary | ICD-10-CM

## 2020-01-04 DIAGNOSIS — B369 Superficial mycosis, unspecified: Secondary | ICD-10-CM

## 2020-01-04 DIAGNOSIS — Z881 Allergy status to other antibiotic agents status: Secondary | ICD-10-CM | POA: Diagnosis not present

## 2020-01-04 DIAGNOSIS — Z6835 Body mass index (BMI) 35.0-35.9, adult: Secondary | ICD-10-CM | POA: Diagnosis not present

## 2020-01-04 LAB — GLUCOSE, CAPILLARY
Glucose-Capillary: 142 mg/dL — ABNORMAL HIGH (ref 70–99)
Glucose-Capillary: 152 mg/dL — ABNORMAL HIGH (ref 70–99)

## 2020-01-04 LAB — TROPONIN I (HIGH SENSITIVITY): Troponin I (High Sensitivity): 16 ng/L (ref <18)

## 2020-01-04 MED ORDER — ACETAMINOPHEN 650 MG RE SUPP: 650.0000 mg | Freq: Four times a day (QID) | RECTAL | Status: DC | PRN

## 2020-01-04 MED ORDER — MIRTAZAPINE 15 MG PO TABS
15.0000 mg | ORAL_TABLET | Freq: Every day | ORAL | Status: DC
  Administered 2020-01-05 – 2020-01-13 (×7): 15 mg via ORAL
  Filled 2020-01-04 (×9): qty 1

## 2020-01-04 MED ORDER — NYSTATIN 100000 UNIT/GM EX CREA
TOPICAL_CREAM | Freq: Once | CUTANEOUS | Status: AC
  Filled 2020-01-04: qty 15

## 2020-01-04 MED ORDER — SODIUM CHLORIDE 0.9% FLUSH
3.0000 mL | Freq: Two times a day (BID) | INTRAVENOUS | Status: DC
  Administered 2020-01-04 – 2020-01-14 (×11): 3 mL via INTRAVENOUS

## 2020-01-04 MED ORDER — SENNOSIDES-DOCUSATE SODIUM 8.6-50 MG PO TABS
1.0000 | ORAL_TABLET | Freq: Every day | ORAL | Status: DC
  Administered 2020-01-05 – 2020-01-14 (×3): 1 via ORAL
  Filled 2020-01-04 (×4): qty 1

## 2020-01-04 MED ORDER — DOCUSATE SODIUM 100 MG PO CAPS
100.0000 mg | ORAL_CAPSULE | Freq: Two times a day (BID) | ORAL | Status: DC
  Administered 2020-01-04 – 2020-01-14 (×12): 100 mg via ORAL
  Filled 2020-01-04 (×16): qty 1

## 2020-01-04 MED ORDER — HYDROCORTISONE (PERIANAL) 2.5 % EX CREA
TOPICAL_CREAM | Freq: Two times a day (BID) | CUTANEOUS | Status: DC
  Administered 2020-01-04: 1 via RECTAL
  Filled 2020-01-04 (×3): qty 28.35

## 2020-01-04 MED ORDER — ACETAMINOPHEN 325 MG PO TABS
650.0000 mg | ORAL_TABLET | Freq: Four times a day (QID) | ORAL | Status: DC | PRN
  Administered 2020-01-04 – 2020-01-06 (×2): 650 mg via ORAL

## 2020-01-04 MED ORDER — METOPROLOL TARTRATE 50 MG PO TABS
50.0000 mg | ORAL_TABLET | Freq: Two times a day (BID) | ORAL | Status: DC
  Administered 2020-01-04 – 2020-01-05 (×3): 50 mg via ORAL
  Filled 2020-01-04 (×3): qty 1

## 2020-01-04 MED ORDER — ZINC OXIDE 40 % EX OINT
1.0000 "application " | TOPICAL_OINTMENT | Freq: Every day | CUTANEOUS | Status: DC | PRN
  Filled 2020-01-04 (×2): qty 113

## 2020-01-04 MED ORDER — ACETAMINOPHEN 325 MG PO TABS
650.0000 mg | ORAL_TABLET | Freq: Four times a day (QID) | ORAL | Status: DC | PRN
  Filled 2020-01-04 (×2): qty 2

## 2020-01-04 MED ORDER — LACTULOSE 10 GM/15ML PO SOLN
10.0000 g | Freq: Every day | ORAL | Status: DC
  Administered 2020-01-05: 10 g via ORAL
  Filled 2020-01-04 (×4): qty 30

## 2020-01-04 MED ORDER — ONDANSETRON HCL 4 MG/2ML IJ SOLN
4.0000 mg | Freq: Once | INTRAMUSCULAR | Status: AC
  Administered 2020-01-04: 4 mg via INTRAVENOUS
  Filled 2020-01-04: qty 2

## 2020-01-04 MED ORDER — INSULIN ASPART 100 UNIT/ML ~~LOC~~ SOLN
0.0000 [IU] | Freq: Every day | SUBCUTANEOUS | Status: DC
  Administered 2020-01-09: 3 [IU] via SUBCUTANEOUS
  Filled 2020-01-04: qty 1

## 2020-01-04 MED ORDER — FERROUS SULFATE 325 (65 FE) MG PO TABS
325.0000 mg | ORAL_TABLET | Freq: Every day | ORAL | Status: DC
  Administered 2020-01-04 – 2020-01-14 (×6): 325 mg via ORAL
  Filled 2020-01-04 (×8): qty 1

## 2020-01-04 MED ORDER — ENOXAPARIN SODIUM 40 MG/0.4ML ~~LOC~~ SOLN: 40.0000 mg | SUBCUTANEOUS | Status: DC

## 2020-01-04 MED ORDER — POTASSIUM CHLORIDE CRYS ER 10 MEQ PO TBCR
10.0000 meq | EXTENDED_RELEASE_TABLET | Freq: Every day | ORAL | Status: DC
  Administered 2020-01-04 – 2020-01-14 (×5): 10 meq via ORAL
  Filled 2020-01-04 (×7): qty 1

## 2020-01-04 MED ORDER — ASPIRIN EC 81 MG PO TBEC
81.0000 mg | DELAYED_RELEASE_TABLET | Freq: Every day | ORAL | Status: DC
  Administered 2020-01-04 – 2020-01-14 (×11): 81 mg via ORAL
  Filled 2020-01-04 (×11): qty 1

## 2020-01-04 MED ORDER — ZOLPIDEM TARTRATE 5 MG PO TABS
5.0000 mg | ORAL_TABLET | Freq: Every evening | ORAL | Status: DC | PRN
  Administered 2020-01-04 – 2020-01-05 (×2): 5 mg via ORAL
  Filled 2020-01-04 (×2): qty 1

## 2020-01-04 MED ORDER — IOHEXOL 300 MG/ML  SOLN
100.0000 mL | Freq: Once | INTRAMUSCULAR | Status: AC | PRN
  Administered 2020-01-04: 100 mL via INTRAVENOUS

## 2020-01-04 MED ORDER — SODIUM CHLORIDE 0.9 % IV SOLN: 1.0000 g | INTRAVENOUS | Status: DC

## 2020-01-04 MED ORDER — ALBUTEROL SULFATE (2.5 MG/3ML) 0.083% IN NEBU: 3.0000 mL | INHALATION_SOLUTION | Freq: Four times a day (QID) | RESPIRATORY_TRACT | Status: DC | PRN

## 2020-01-04 MED ORDER — INSULIN ASPART 100 UNIT/ML ~~LOC~~ SOLN
0.0000 [IU] | Freq: Three times a day (TID) | SUBCUTANEOUS | Status: DC
  Administered 2020-01-04: 2 [IU] via SUBCUTANEOUS
  Administered 2020-01-04: 3 [IU] via SUBCUTANEOUS
  Administered 2020-01-05: 5 [IU] via SUBCUTANEOUS
  Administered 2020-01-06 (×2): 3 [IU] via SUBCUTANEOUS
  Administered 2020-01-07: 2 [IU] via SUBCUTANEOUS
  Administered 2020-01-07: 3 [IU] via SUBCUTANEOUS
  Administered 2020-01-08: 2 [IU] via SUBCUTANEOUS
  Administered 2020-01-08: 1 [IU] via SUBCUTANEOUS
  Administered 2020-01-08: 8 [IU] via SUBCUTANEOUS
  Administered 2020-01-10 (×2): 2 [IU] via SUBCUTANEOUS
  Administered 2020-01-10: 8 [IU] via SUBCUTANEOUS
  Administered 2020-01-11: 2 [IU] via SUBCUTANEOUS
  Administered 2020-01-11 – 2020-01-14 (×4): 3 [IU] via SUBCUTANEOUS
  Administered 2020-01-14: 2 [IU] via SUBCUTANEOUS
  Administered 2020-01-14 – 2020-01-15 (×2): 5 [IU] via SUBCUTANEOUS
  Filled 2020-01-04 (×22): qty 1

## 2020-01-04 MED ORDER — ENOXAPARIN SODIUM 40 MG/0.4ML ~~LOC~~ SOLN
40.0000 mg | SUBCUTANEOUS | Status: DC
  Administered 2020-01-07: 40 mg via SUBCUTANEOUS
  Filled 2020-01-04 (×5): qty 0.4

## 2020-01-04 MED ORDER — SODIUM CHLORIDE 0.9 % IV SOLN
1.0000 g | Freq: Once | INTRAVENOUS | Status: AC
  Administered 2020-01-04: 1 g via INTRAVENOUS
  Filled 2020-01-04: qty 10

## 2020-01-04 MED ORDER — ALLOPURINOL 100 MG PO TABS
100.0000 mg | ORAL_TABLET | Freq: Every day | ORAL | Status: DC
  Administered 2020-01-05: 100 mg via ORAL
  Filled 2020-01-04 (×4): qty 1

## 2020-01-04 MED ORDER — ONDANSETRON HCL 4 MG/2ML IJ SOLN: 4.0000 mg | Freq: Four times a day (QID) | INTRAMUSCULAR | Status: DC | PRN

## 2020-01-04 MED ORDER — MORPHINE SULFATE (PF) 2 MG/ML IV SOLN
2.0000 mg | Freq: Once | INTRAVENOUS | Status: AC
  Administered 2020-01-04: 2 mg via INTRAVENOUS
  Filled 2020-01-04: qty 1

## 2020-01-04 MED ORDER — GUAIFENESIN 100 MG/5ML PO SOLN
5.0000 mL | ORAL | Status: DC | PRN
  Filled 2020-01-04: qty 5

## 2020-01-04 MED ORDER — CHOLESTYRAMINE 4 G PO PACK
1.0000 | PACK | Freq: Three times a day (TID) | ORAL | Status: DC
  Filled 2020-01-04 (×10): qty 1

## 2020-01-04 MED ORDER — SODIUM CHLORIDE 0.9 % IV SOLN: 250.0000 mL | INTRAVENOUS | Status: DC | PRN

## 2020-01-04 MED ORDER — SODIUM CHLORIDE 0.9% FLUSH: 3.0000 mL | INTRAVENOUS | Status: DC | PRN

## 2020-01-04 MED ORDER — ONDANSETRON HCL 4 MG PO TABS: 4.0000 mg | ORAL_TABLET | Freq: Four times a day (QID) | ORAL | Status: DC | PRN

## 2020-01-04 MED ORDER — COLCHICINE 0.6 MG PO TABS
0.6000 mg | ORAL_TABLET | Freq: Every day | ORAL | Status: DC | PRN
  Filled 2020-01-04: qty 1

## 2020-01-04 MED ORDER — FUROSEMIDE 40 MG PO TABS
80.0000 mg | ORAL_TABLET | Freq: Two times a day (BID) | ORAL | Status: DC
  Administered 2020-01-05 – 2020-01-06 (×2): 80 mg via ORAL
  Filled 2020-01-04 (×5): qty 2

## 2020-01-04 MED ORDER — SODIUM CHLORIDE 0.9 % IV SOLN
1.0000 g | INTRAVENOUS | Status: DC
  Administered 2020-01-04 – 2020-01-05 (×2): 1 g via INTRAVENOUS
  Filled 2020-01-04 (×3): qty 10

## 2020-01-04 MED ORDER — IPRATROPIUM-ALBUTEROL 0.5-2.5 (3) MG/3ML IN SOLN: 3.0000 mL | Freq: Four times a day (QID) | RESPIRATORY_TRACT | Status: DC | PRN

## 2020-01-04 NOTE — ED Notes (Signed)
RN went to introduce self and assess patient. Patient found to be sleeping, breathing unlabored with symmetric chest rise and fall. Pt woke easily to verbal stimuli but quickly dozed back off. Pt in bed with bed low and locked and siderails raised x2. Call bell in reach. RN to continue to monitor.

## 2020-01-04 NOTE — ED Notes (Signed)
RN called to pt room with patient yelling that she needs a pillow. RN reminded patient that blanket had been placed under pt head with her consent and pt now stating loudly that this was not enough. RN stated to pt that she would attempt to located an additional pillow. Pt also now stating that her rectum hurting and requesting "a shot of that strong pain medication." RN informed pt she would gather ordered PRN pain medication for pt. Pt then loudly stated that her gown was wet. RN inquired as to how gown got wet and pt declined to answer. Mickey [place] was not wet to RN assessment, but pt gown was changed and pt provided with a fresh blanket. RN to continue to monitor.

## 2020-01-04 NOTE — ED Notes (Signed)
Patient bed linen changed and pt  repositioned in bed for comfort per request. Medications administered and VS updated. Pt refused CBG check and Insulin administration. Pt back in bed with bed low and locked and symmetric chest rise and fall noted. Pt breathing unlabored with symmetric chest rise and fall and speaking in full sentences. Call bell in reach of pt as well as table with drink and belongings.

## 2020-01-04 NOTE — ED Notes (Signed)
RN called to room as pt stating "some nurse stole my pillow." Pt with two pillows at bedside which RN had previously assisted pt with placing behind head and back per pt request. Pt now noted to have one pillow at R side under arm and another placed at lower back. RN inquired as to how she could help pt with comfort and suggested utilizing blankets to prop R arm and placing that pillow back under pt head. Pt consented. RN left room to retrieve blankets and promptly returned. Upon arrival back into room pt requested to have blanket under head instead and to leave the pillow under R arm in place. RN consented and 1 blanket was placed under pt head per pt request. Pt was also provided with a cup of ice per request and thermostat adjusted as pt stated she was hot. Pt denies further needs at this time. Pt in bed with bed low and locked, side rails raised x2, and call bell in reach.

## 2020-01-04 NOTE — ED Notes (Signed)
Held Lasix

## 2020-01-04 NOTE — ED Notes (Signed)
Pt had a BM that was soft in consistency, pt requests colace and Milk of mag, pt educated about her stool.

## 2020-01-04 NOTE — ED Notes (Signed)
Diet order and breakfast tray ordered for pt.

## 2020-01-04 NOTE — ED Notes (Signed)
Pt resting comfortably at this time. Warm blankets provided to pt. Pt has refused multiple meds due to "not needing them".

## 2020-01-04 NOTE — H&P (Signed)
History and Physical    Laurita Peron ZOX:[id number] DOB: [birth year] DOA: 01/04/2020  PCP: Alfredia Client, MD   Patient coming from: Home  I have personally briefly reviewed patient's old medical records in [place]  Chief Complaint: Leg swelling  HPI: Karla Sanchez is a 79 y.o. 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 Patient was recently discharged from [facility] after treatment for bilateral lower extremity cellulitis as well as right labial cellulitis. Patient also complains of a rash over her pannus as well as under her breast for which she has been applying Desitin without any significant improvement. She denies having any fever, no chills, no nausea, no vomiting, no urinary symptoms or any changes in her bowel habits. Labs show sodium 143, potassium 4.2, chloride 102, bicarb 27, BUN 38, creatinine 1.08, calcium 8.4, troponin XVII, white count 12.8, hemoglobin 11.5, hematocrit 38.1, MCV 77.4, RDW 21, platelet count 315 CT scan of abdomen and pelvis shows skin thickening and inflammatory changes in the inferior pannus compatible with cellulitis.  Skin thickening of the perineum extending to the labia this is consistent with clinical diagnosis of cellulitis.  No deeper evaluation of abscess is present.  Cardiomegaly without failure.  Ventral periumbilical hernia containing loops of small bowel without obstruction. Chest x-ray reviewed by me shows minimal left lung base atelectasis/scarring. Right lower extremity ultrasound is negative for DVT Twelve-lead EKG reviewed by me shows sinus rhythm with PVCs and a right bundle branch block    ED Course: Patient is a 79 year old Caucasian female with  multiple medical problems who presents to the emergency room for evaluation of pain involving her right lower extremity with redness as well as a rash under her pannus as well as her breast.  Imaging is suggestive of cellulitis involving the pannus as well as cellulitis of the labia.  She will be admitted to the hospital for further evaluation  Review of Systems: As per HPI otherwise 10 point review of systems negative.    Past Medical History:  Diagnosis Date  . CHF (congestive heart failure) ([place])   . Colon cancer [facility])    s/p right and left colectomy 2020  . COPD (chronic obstructive pulmonary disease) ([place])   . Diabetes mellitus without complication ([place])   . Diastolic heart failure ([place])   . Gout   . Hypertension     Past Surgical History:  Procedure Laterality Date  . COLON SURGERY    . TUBAL LIGATION  1970     reports that she has never smoked. She has never used smokeless tobacco. She reports previous alcohol use. She reports that she does not use drugs.  Allergies  Allergen Reactions  . Codeine Nausea Only, Other (See Comments) and Swelling    Other Reaction: nausea and vomiting Other reaction(s): NAUSEA   . Other Other (See Comments)    Other reaction(s): SWELLING  Other reaction(s): MUSCLE PAIN Muscle aches Other reaction(s): MUSCLE PAIN Muscle aches Other reaction(s): MUSCLE PAIN Muscle aches   . Exenatide Nausea And Vomiting  . Levofloxacin Other (See Comments)    Other Reaction: sloughing of buccal mucosa Other reaction(s): OTHER Lost skin in mouth Patient states she can take Cipro without difficulty and has taken it many times in the past Other Reaction: sloughing of buccal  mucosa   . Losartan Other (See Comments)    Weight gain Weight gain   . Tetracyclines & Related Other (See Comments)    Family History  Problem Relation Age of Onset  . Lung cancer Father      Prior to Admission medications   Medication Sig Start Date End Date Taking?  Authorizing Provider  acetaminophen (TYLENOL) 325 MG tablet Take 650 mg by mouth every 6 (six) hours as needed for mild pain or fever.    Yes [provider]  albuterol (VENTOLIN HFA) 108 (90 Base) MCG/ACT inhaler Inhale 2 puffs into the lungs every 6 (six) hours as needed for wheezing or shortness of breath. 09/29/19  Yes Hackney, Tina A, FNP  allopurinol (ZYLOPRIM) 100 MG tablet Take 100 mg by mouth daily.   Yes [provider]  aspirin EC 81 MG tablet Take 81 mg by mouth daily. Swallow whole.   Yes [provider]  cholestyramine (QUESTRAN) 4 g packet Take 1 packet by mouth 3 (three) times daily with meals. 01/02/20 02/01/20 Yes [provider]  colchicine 0.6 MG tablet Take 0.6 mg by mouth daily as needed (acute gout flare).    Yes [provider]  docusate sodium (COLACE) 100 MG capsule Take 100 mg by mouth at bedtime.   Yes [provider]  ferrous sulfate 325 (65 FE) MG tablet Take 325 mg by mouth daily.   Yes [provider]  furosemide (LASIX) 40 MG tablet Take 1 tablet (40 mg total) by mouth 2 (two) times daily. Patient taking differently: Take 80 mg by mouth 2 (two) times daily.  12/19/19  Yes Hackney, Otila Kluver A, FNP  guaiFENesin (ROBITUSSIN) 100 MG/5ML SOLN Take 5 mLs by mouth every 4 (four) hours as needed for cough or to loosen phlegm.   Yes [provider]  hydrocortisone (ANUSOL-HC) 2.5 % rectal cream Place rectally 2 (two) times daily. 11/24/19  Yes Max Sane, MD  insulin glargine (LANTUS) 100 UNIT/ML injection Inject 0.15 mLs (15 Units total) into the skin at bedtime. 07/13/19  Yes Lorella Nimrod, MD  insulin lispro (HUMALOG) 100 UNIT/ML injection Inject into the skin 3 (three) times daily before meals.   Yes [provider]  ipratropium-albuterol (DUONEB) 0.5-2.5 (3) MG/3ML SOLN Take 3 mLs by nebulization every 6 (six) hours as needed.   Yes [provider]  lactulose (CHRONULAC) 10 GM/15ML solution  Take 10 g by mouth daily.   Yes [provider]  liver oil-zinc oxide (DESITIN) 40 % ointment Apply 1 application topically daily as needed for irritation. Uses on buttock    Yes [provider]  metoprolol tartrate (LOPRESSOR) 50 MG tablet Take 0.5 tablets (25 mg total) by mouth 2 (two) times daily. Patient taking differently: Take 50 mg by mouth 2 (two) times daily.  10/30/19  Yes Alma Friendly, MD  mirtazapine (REMERON) 15 MG tablet Take 15 mg by mouth at bedtime.   Yes [provider]  potassium chloride (KLOR-CON) 10 MEQ tablet Take 10 mEq by mouth daily.  01/02/20 01/01/21 Yes [provider]  senna-docusate (SENOKOT-S) 8.6-50 MG tablet Take 1 tablet by mouth daily.   Yes [provider]  zolpidem (AMBIEN) 5 MG tablet Take 5 mg by mouth at bedtime.   Yes [provider]  benzonatate (TESSALON) 200 MG capsule Take 1 capsule (200 mg total) by mouth 3 (three) times daily as needed for cough. Patient not taking: Reported on 01/04/2020 11/23/19   Max Sane, MD  polyethylene glycol (MIRALAX / GLYCOLAX) 17 g packet Take 17 g by mouth daily as needed for severe constipation. Patient not taking: Reported on 01/04/2020 11/23/19   Max Sane, MD    Physical Exam: Vitals:   01/04/20 0001 01/04/20 0021 01/04/20 0522 01/04/20 0845  BP: (!) 177/55 (!) 178/72 (!) 156/68 (!) 137/47  Pulse: 74 69 62 65  Resp: 20 20    Temp: 98 F (36.7 C) (!) 96.8 F (36 C)    TempSrc: Oral Oral    SpO2: 97% 96% 99% 96%  Weight:      Height:         Vitals:   01/04/20 0001 01/04/20 0021 01/04/20 0522 01/04/20 0845  BP: (!) 177/55 (!) 178/72 (!) 156/68 (!) 137/47  Pulse: 74 69 62 65  Resp: 20 20    Temp: 98 F (36.7 C) (!) 96.8 F (36 C)    TempSrc: Oral Oral    SpO2: 97% 96% 99% 96%  Weight:      Height:        Constitutional: NAD, alert and oriented x 3 Eyes: PERRL, lids and conjunctivae normal ENMT: Mucous membranes are dry.  Neck: normal,  supple, no masses, no thyromegaly Respiratory: clear to auscultation bilaterally, no wheezing, no crackles. Normal respiratory effort. No accessory muscle use.  Cardiovascular: Regular rate and rhythm, no murmurs / rubs / gallops. 2+ extremity edema. 2+ pedal pulses. No carotid bruits.  Abdomen: no tenderness, no masses palpated. No hepatosplenomegaly. Bowel sounds positive. Fungal rash under the pannus Musculoskeletal: no clubbing / cyanosis. No joint deformity upper and lower extremities.  Skin: rashes underneath the panus as well as the underneath the right breast, lesions, ulcers.  Redness involving the right lower extremity Neurologic: No gross focal neurologic deficit. Psychiatric: Normal mood and affect.   Labs on Admission: I have personally reviewed following labs and imaging studies  CBC: Recent Labs  Lab 01/03/20 1959  WBC 12.8*  HGB 11.5*  HCT 38.1  MCV 77.4*  PLT 062   Basic Metabolic Panel: Recent Labs  Lab 01/03/20 1959  NA 143  K 4.2  CL 102  CO2 27  GLUCOSE 201*  BUN 38*  CREATININE 1.08*  CALCIUM 8.4*   GFR: Estimated Creatinine Clearance: 50.3 mL/min (A) (by C-G formula based on SCr of 1.08 mg/dL (H)). Liver Function Tests: No results for input(s): AST, ALT, ALKPHOS, BILITOT, PROT, ALBUMIN in the last 168 hours. No results for input(s): LIPASE, AMYLASE in the last 168 hours. No results for input(s): AMMONIA in the last 168 hours. Coagulation Profile: No results for input(s): INR, PROTIME in the last 168 hours. Cardiac Enzymes: No results for input(s): CKTOTAL, CKMB, CKMBINDEX, TROPONINI in the last 168 hours. BNP (last 3 results) No results for input(s): PROBNP in the last 8760 hours. HbA1C: No results for input(s): HGBA1C in the last 72 hours. CBG: Recent Labs  Lab 01/04/20 0933  GLUCAP 142*   Lipid Profile: No results for input(s): CHOL, HDL, LDLCALC, TRIG, CHOLHDL, LDLDIRECT in the last 72 hours. Thyroid Function Tests: No results for  input(s): TSH, T4TOTAL, FREET4, T3FREE, THYROIDAB in the last 72 hours. Anemia Panel: No results for input(s): VITAMINB12, FOLATE, FERRITIN, TIBC, IRON, RETICCTPCT in the last 72 hours. Urine analysis:    Component Value Date/Time   COLORURINE STRAW (A) 07/30/2019 2204   APPEARANCEUR CLEAR (A) 07/30/2019 2204   LABSPEC 1.009 07/30/2019 2204   PHURINE 5.0 07/30/2019 2204   GLUCOSEU NEGATIVE 07/30/2019 2204   HGBUR  NEGATIVE 07/30/2019 [place] 07/30/2019 [place] 07/30/2019 2204   PROTEINUR 100 (A) 07/30/2019 2204   NITRITE NEGATIVE 07/30/2019 2204   LEUKOCYTESUR NEGATIVE 07/30/2019 2204    Radiological Exams on Admission: DG Chest 2 View  Result Date: 01/03/2020 CLINICAL DATA:  [age]-year-old female with shortness of breath. EXAM: CHEST - 2 VIEW COMPARISON:  Chest radiograph dated 12/08/2019. FINDINGS: Minimal left lung base atelectasis/scarring. Blunting of the left costophrenic angle may be related to scarring. Trace left pleural effusion is not excluded clinical correlation is recommended. The right lung is clear. No pneumothorax. The cardiac silhouette is within limits. Atherosclerotic calcification of the aorta. Degenerative changes of the spine. No acute osseous pathology. IMPRESSION: Minimal left lung base atelectasis/scarring. Electronically Signed   By: Anner Crete M.D.   On: 01/03/2020 20:22   CT ABDOMEN PELVIS W CONTRAST  Result Date: 01/04/2020 CLINICAL DATA:  Abdominal pain. Fever. Perineal cellulitis. Question labial abscess. EXAM: CT ABDOMEN AND PELVIS WITH CONTRAST TECHNIQUE: Multidetector CT imaging of the abdomen and pelvis was performed using the standard protocol following bolus administration of intravenous contrast. CONTRAST:  155mL OMNIPAQUE IOHEXOL 300 MG/ML  SOLN COMPARISON:  None. FINDINGS: Lower chest: Mild dependent atelectasis is present. The heart is enlarged. Coronary artery calcifications are present. No significant  pericardial effusion is present. Hepatobiliary: No focal liver abnormality is seen. No gallstones, gallbladder wall thickening, or biliary dilatation. Pancreas: Unremarkable. No pancreatic ductal dilatation or surrounding inflammatory changes. Spleen: Water density the lesion in the anterior aspect of the spleen measures 5.7 cm compatible with a simple cyst. No other focal lesions are present. Adrenals/Urinary Tract: Adrenal glands are within normal limits bilaterally. Water density exophytic cyst is present posteriorly at the upper pole of the left kidney. Lesion measures 2.0 cm. 1.7 cm simple cyst is present at the lower pole of the left kidney. Lobulations are noted in both kidneys without other discrete mass. No stone or obstruction is present. The ureters are within normal limits bilaterally. The urinary bladder is normal. Stomach/Bowel: Stomach is within normal limits. Duodenal diverticulum is present without inflammatory changes. Small bowel is otherwise unremarkable. Partial right colectomy noted. Anastomosis is intact. Transverse and descending colon are within normal limits. Multiple loops of small bowel extend into a ventral paraumbilical hernia. No obstruction is present. Vascular/Lymphatic: Atherosclerotic changes are present in the aorta and branch vessels without aneurysm. No significant adenopathy is present. Reproductive: Uterus unremarkable. 3.5 cm left adnexal cyst is noted. Adnexa are otherwise within normal limits. Other: Skin thickening inflammatory changes are present in the inferior pannus. Ventral hernia noted as above. No significant free fluid or free air is present. Skin thickening is present along the perineum extending to the labia. No abscess is present. Musculoskeletal: L1 inferior endplate compression fracture appears remote. Vertebral body heights are otherwise maintained. Anterior osteophytes are fused in the thoracic spine. No focal lytic or blastic lesions are present.  Degenerative changes are present at the SI joints bilaterally. Bony pelvis is otherwise normal. Degenerative changes are present in the hips. Hips are located. No acute abnormalities are present. IMPRESSION: 1. Skin thickening and inflammatory changes in the inferior pannus compatible with cellulitis. 2. Skin thickening along the perineum extending to the labia. This is consistent with clinical diagnosis of cellulitis. No deeper inflammation or abscess is present. 3. Ventral paraumbilical hernia containing loops of small bowel without obstruction. 4. Cardiomegaly without failure. 5. Coronary artery disease. 6. Benign-appearing left renal cysts. 7. L1 inferior  endplate compression fracture appears remote. 8. 3.5 cm left adnexal cyst. Recommend follow-up US in 6-12 months. Note: This recommendation does not apply to premenarchal patients and to those with increased risk (genetic, family history, elevated tumor markers or other high-risk factors) of ovarian cancer. Reference: JACR 2020 Feb; 17(2):248-254 9. Aortic Atherosclerosis (ICD10-I70.0). Electronically Signed   By: San Morelle M.D.   On: 01/04/2020 05:15   US Venous Img Lower Unilateral Right  Result Date: 01/04/2020 CLINICAL DATA:  Right leg pain and swelling EXAM: RIGHT LOWER EXTREMITY VENOUS DOPPLER ULTRASOUND TECHNIQUE: Gray-scale sonography with compression, as well as color and duplex ultrasound, were performed to evaluate the deep venous system(s) from the level of the common femoral vein through the popliteal and proximal calf veins. COMPARISON:  None. FINDINGS: VENOUS Normal compressibility of the common femoral, superficial femoral, and popliteal veins, as well as the visualized calf veins. Visualized portions of profunda femoral vein and great saphenous vein unremarkable. No filling defects to suggest DVT on grayscale or color Doppler imaging. Doppler waveforms show normal direction of venous flow, normal respiratory plasticity and  response to augmentation. Limited views of the contralateral common femoral vein are unremarkable. OTHER None. Limitations: none IMPRESSION: Negative. Electronically Signed   By: Fidela Salisbury MD   On: 01/04/2020 03:03    EKG: Independently reviewed.  Sinus rhythm with PVCs Right bundle branch block  Assessment/Plan Principal Problem:   Cellulitis of right leg Active Problems:   COPD exacerbation (HCC)   Chronic diastolic CHF (congestive heart failure) (HCC)   Type 2 diabetes mellitus with stage 3 chronic kidney disease (HCC)   Chronic kidney disease, stage 3b (HCC)   Chronic respiratory failure with hypoxia (HCC)    Right lower extremity cellulitis/cellulitis involving the pannus and labia patient presents to the emergency room for evaluation of right lower extremity pain and redness.   She also has a rash under her pannus which is erythematous Patient was recently treated with IV antibiotics at [facility] for cellulitis involving the right labia as well as lower extremity cellulitis We will place patient on IV antibiotic therapy with Rocephin Follow-up results of blood cultures We will request wound consult for evaluation of rash underneath her pannus and breast which may have a superimposed fungal infection as well    COPD and history of chronic respiratory failure Not acutely exacerbated Place patient on as needed bronchodilator therapy Place patient on inhaled steroids  Continue oxygen supplementation at 3 L continuous which is patient's baseline   Chronic diastolic dysfunction CHF Last known LVEF 50 to 55% Continue with Lasix 80 mg BID Continue metoprolol since patient Patient not on ACE inhibitor or ARB due to allergy Maintain low-sodium diet   Diabetes mellitus type 2 with complications of stage III chronic kidney disease Place patient on sliding scale coverage for glycemic control Maintain consistent carbohydrate diet   Morbid obesity(BMI  35) Complicates overall prognosis and care    Anemia of chronic disease H&H is stable   Physical deconditioning Secondary to multiple chronic medical problems Patient is wheelchair-bound and is usually able to transfer from her wheelchair to the bed but is unable to do that at this time       DVT prophylaxis: Lovenox Code Status: Full code Family Communication: Greater than 50% of time was spent discussing patient's condition and plan of care with her at the bedside.  All questions and concerns have been addressed.  She verbalizes understanding and agrees with the plan. Disposition Plan: Back  to previous home environment Consults called: Wound Care    Derika Eckles MD [facility]     01/04/2020, 10:48 AM

## 2020-01-04 NOTE — ED Notes (Signed)
Pt refuses vitals at this time due to "wanting to eat dinner".

## 2020-01-04 NOTE — ED Notes (Addendum)
Pt assisted to BR via w/c to void; stand-by assist required only; pt given cola and peanut butter crackers as requested

## 2020-01-04 NOTE — ED Notes (Addendum)
Pt now refuses COVID swab, MD aware.

## 2020-01-04 NOTE — ED Notes (Signed)
Pt currently sitting up in a wheelchair, pt states this is more comfortable at this time. No current distress noted. Pt denies any further needs at this time. Call bell in reach.

## 2020-01-04 NOTE — Consult Note (Addendum)
[facility] Nurse Consult Note: Reason for Consult: Consult requested for pannus and bilat breast folds.  Wound type: Pt has red moist macerated areas in the skin folds and there are partial thickness fissures related to moisture.  Appearance is consistent with probable candidiasis and intertrigo.  Interdry silver-impregnated fabric ordered for use by bedside nurses and instructions provided. This product should remain in place for 5 days for optimal plan of care to provide antimicrobial benefits and wick moisture away from skin.  Please re-consult if further assistance is needed.  Thank-you,  Julien Girt MSN, [place], [place], [place], [place]

## 2020-01-04 NOTE — ED Provider Notes (Signed)
Procedures     ----------------------------------------- 8:19 AM on 01/04/2020 ----------------------------------------- Pt refusing Covid screening today.  Review of EMR shows she did have covid test performed at [place] 3 days ago, which was negative. Will cancel covid test today since current presentation is not high suspicion for covid.  [place] 01/01/2020 Component    SARS-CoV-2 PCR  Influenza A  Influenza B  RSV  Component 01/01/2020 12/29/2019 02/20/2019 02/06/2019 02/02/2019 12/29/2018 12/22/2018 11/29/2018 11/02/2018 10/31/2018 10/18/2018               SARS-CoV-2 PCR Negative Negative PositiveAbnormal   Negative              Carrie Mew, MD 01/04/20 [phone]

## 2020-01-04 NOTE — ED Notes (Signed)
Pt assisted to BR via w/c; stand-by assist required only

## 2020-01-04 NOTE — ED Provider Notes (Signed)
[facility] Emergency Department Provider Note  ____________________________________________   First MD Initiated Contact with Patient 01/04/20 0215     (approximate)  I have reviewed the triage vital signs and the nursing notes.   HISTORY  Chief Complaint Leg Swelling and Shortness of Breath    HPI Karla Sanchez is a 79 y.o. female with below list of previous medical conditions including diabetes mellitus CHF hypertension, COPD presents to the emergency department secondary to right leg pain and swelling which patient states has been progressively worsening over the past 6 weeks with associated redness.  Patient also admits to vaginal/perineal discomfort and concerns for possible yeast infection for which patient states she has been applying Desitin without any improvement over the past week.  Patient denies any fever.  Patient denies any nausea or vomiting.  No diarrhea or constipation.  Patient denies any urinary symptoms.        Past Medical History:  Diagnosis Date  . CHF (congestive heart failure) ([place])   . Colon cancer [facility])    s/p right and left colectomy 2020  . COPD (chronic obstructive pulmonary disease) ([place])   . Diabetes mellitus without complication ([place])   . Diastolic heart failure ([place])   . Gout   . Hypertension     Patient Active Problem List   Diagnosis Date Noted  . COPD with acute exacerbation ([place]) 12/08/2019  . Physical deconditioning 12/08/2019  . Acute respiratory failure with hypoxia ([place])   . Iron deficiency anemia   . Type 2 diabetes mellitus without complication, with long-term current use of insulin ([place])   . CHF exacerbation ([place]) 11/20/2019  . Acute on chronic diastolic (congestive) heart failure ([place]) 11/19/2019  . Acute pulmonary edema ([facility])   . Acute on chronic diastolic CHF (congestive heart failure) ([place]) 10/22/2019  . Cellulitis of right foot 10/22/2019  . Non compliance with medical treatment   .  Chronic respiratory failure with hypoxia ([place])   . Hypertension   . Chronic kidney disease, stage 3b ([place])   . Acute on chronic respiratory failure with hypoxia ([place])   . Diarrhea   . COPD (chronic obstructive pulmonary disease) ([place]) 10/03/2019  . Obesity, Class III, BMI 40-49.9 (morbid obesity) ([place]) 10/03/2019  . Acute exacerbation of CHF (congestive heart failure) ([place]) 09/08/2019  . Acute CHF (congestive heart failure) ([place]) 09/07/2019  . COPD exacerbation ([facility]) 07/09/2019  . Chronic diastolic CHF (congestive heart failure) ([place]) 07/09/2019  . Type 2 diabetes mellitus with stage 3 chronic kidney disease ([place]) 07/09/2019  . Peripheral edema 07/09/2019  . Gout 07/09/2019  . Obesity, diabetes, and hypertension syndrome ([place]) 07/09/2019  . Anemia of chronic disease 07/09/2019    Past Surgical History:  Procedure Laterality Date  . COLON SURGERY    . TUBAL LIGATION  1970    Prior to Admission medications   Medication Sig Start Date End Date Taking? Authorizing Provider  acetaminophen (TYLENOL) 325 MG tablet Take 650 mg by mouth every 6 (six) hours as needed for mild pain or fever.     [provider]  albuterol (VENTOLIN HFA) 108 (90 Base) MCG/ACT inhaler Inhale 2 puffs into the lungs every 6 (six) hours as needed for wheezing or shortness of breath. 09/29/19   [name], FNP  benzonatate (TESSALON) 200 MG capsule Take 1 capsule (200 mg total) by mouth 3 (three) times daily as needed for cough. 11/23/19   Max Sane, MD  colchicine 0.6 MG tablet Take 0.6 mg by  mouth daily as needed (acute gout flare).     [provider]  furosemide (LASIX) 40 MG tablet Take 1 tablet (40 mg total) by mouth 2 (two) times daily. 12/19/19   [name], FNP  hydrocortisone (ANUSOL-HC) 2.5 % rectal cream Place rectally 2 (two) times daily. 11/24/19   Max Sane, MD  insulin glargine (LANTUS) 100 UNIT/ML injection Inject 0.15 mLs (15 Units total) into the skin at bedtime. 07/13/19    Lorella Nimrod, MD  metoprolol tartrate (LOPRESSOR) 50 MG tablet Take 0.5 tablets (25 mg total) by mouth 2 (two) times daily. 10/30/19   Alma Friendly, MD  polyethylene glycol (MIRALAX / GLYCOLAX) 17 g packet Take 17 g by mouth daily as needed for severe constipation. 11/23/19   Max Sane, MD    Allergies Codeine, Other, Exenatide, Levofloxacin, Losartan, and Tetracyclines & related  Family History  Problem Relation Age of Onset  . Lung cancer Father     Social History Social History   Tobacco Use  . Smoking status: Never Smoker  . Smokeless tobacco: Never Used  Substance Use Topics  . Alcohol use: Not Currently  . Drug use: Never    Review of Systems Constitutional: No fever/chills Eyes: No visual changes. ENT: No sore throat. Cardiovascular: Denies chest pain. Respiratory: Denies shortness of breath. Gastrointestinal: No abdominal pain.  No nausea, no vomiting.  No diarrhea.  No constipation. Genitourinary: Negative for dysuria. Musculoskeletal: Positive for right leg pain swelling and redness negative for neck pain.  Negative for back pain. Integumentary: Negative for rash. Neurological: Negative for headaches, focal weakness or numbness.  ____________________________________________   PHYSICAL EXAM:  VITAL SIGNS: ED Triage Vitals  Enc Vitals Group     BP 01/03/20 1955 (!) 144/65     Pulse Rate 01/03/20 1955 64     Resp 01/03/20 1955 20     Temp 01/03/20 1955 98.6 F (37 C)     Temp Source 01/03/20 1955 Oral     SpO2 01/03/20 1931 97 %     Weight 01/03/20 1956 99.8 kg (220 lb)     Height 01/03/20 1956 1.676 m (5\' 6" )     Head Circumference --      Peak Flow --      Pain Score 01/03/20 1955 5     Pain Loc --      Pain Edu? --      Excl. in [place]? --     Constitutional: Alert and oriented.  Eyes: Conjunctivae are normal.  Head: Atraumatic. Mouth/Throat: Patient is wearing a mask. Neck: No stridor.  No meningeal signs.   Cardiovascular: Normal rate,  regular rhythm. Good peripheral circulation. Grossly normal heart sounds. Respiratory: Normal respiratory effort.  No retractions. Gastrointestinal: Soft and nontender. No distention.  Musculoskeletal: No lower extremity tenderness nor edema. No gross deformities of extremities. Neurologic:  Normal speech and language. No gross focal neurologic deficits are appreciated.  Skin: Perineal blanching erythema with extension to the left labia majora.  Findings consistent with yeast infection and into triggers spaces.  Watching erythema also noted in the pannus of the lower abdomen consistent with cellulitis. Psychiatric: Mood and affect are normal. Speech and behavior are normal.  ____________________________________________   LABS (all labs ordered are listed, but only abnormal results are displayed)  Labs Reviewed  BASIC METABOLIC PANEL - Abnormal; Notable for the following components:      Result Value   Glucose, Bld 201 (*)    BUN 38 (*)  Creatinine, Ser 1.08 (*)    Calcium 8.4 (*)    GFR, Estimated 49 (*)    All other components within normal limits  CBC - Abnormal; Notable for the following components:   WBC 12.8 (*)    Hemoglobin 11.5 (*)    MCV 77.4 (*)    MCH 23.4 (*)    RDW 21.0 (*)    All other components within normal limits  TROPONIN I (HIGH SENSITIVITY)  TROPONIN I (HIGH SENSITIVITY)   ____________________________________________  EKG  ED ECG REPORT I, Star[place] N Jase Reep, the attending physician, personally viewed and interpreted this ECG.   Date: 01/03/2020  EKG Time: 7:56 PM  Rate: 62  Rhythm: Normal sinus rhythm with right bundle branch block  Axis: Left axis deviation  Intervals: Normal  ST&T Change: None  ____________________________________________  RADIOLOGY I, [place] N Dajiah Kooi, personally viewed and evaluated these images (plain radiographs) as part of my medical decision making, as well as reviewing the written report by the radiologist.  ED MD  interpretation: Cellulitis and perineal cellulitis noted on CT  Official radiology report(s): DG Chest 2 View  Result Date: 01/03/2020 CLINICAL DATA:  [age]-year-old female with shortness of breath. EXAM: CHEST - 2 VIEW COMPARISON:  Chest radiograph dated 12/08/2019. FINDINGS: Minimal left lung base atelectasis/scarring. Blunting of the left costophrenic angle may be related to scarring. Trace left pleural effusion is not excluded clinical correlation is recommended. The right lung is clear. No pneumothorax. The cardiac silhouette is within limits. Atherosclerotic calcification of the aorta. Degenerative changes of the spine. No acute osseous pathology. IMPRESSION: Minimal left lung base atelectasis/scarring. Electronically Signed   By: Anner Crete M.D.   On: 01/03/2020 20:22   CT ABDOMEN PELVIS W CONTRAST  Result Date: 01/04/2020 CLINICAL DATA:  Abdominal pain. Fever. Perineal cellulitis. Question labial abscess. EXAM: CT ABDOMEN AND PELVIS WITH CONTRAST TECHNIQUE: Multidetector CT imaging of the abdomen and pelvis was performed using the standard protocol following bolus administration of intravenous contrast. CONTRAST:  149mL OMNIPAQUE IOHEXOL 300 MG/ML  SOLN COMPARISON:  None. FINDINGS: Lower chest: Mild dependent atelectasis is present. The heart is enlarged. Coronary artery calcifications are present. No significant pericardial effusion is present. Hepatobiliary: No focal liver abnormality is seen. No gallstones, gallbladder wall thickening, or biliary dilatation. Pancreas: Unremarkable. No pancreatic ductal dilatation or surrounding inflammatory changes. Spleen: Water density the lesion in the anterior aspect of the spleen measures 5.7 cm compatible with a simple cyst. No other focal lesions are present. Adrenals/Urinary Tract: Adrenal glands are within normal limits bilaterally. Water density exophytic cyst is present posteriorly at the upper pole of the left kidney. Lesion measures 2.0 cm.  1.7 cm simple cyst is present at the lower pole of the left kidney. Lobulations are noted in both kidneys without other discrete mass. No stone or obstruction is present. The ureters are within normal limits bilaterally. The urinary bladder is normal. Stomach/Bowel: Stomach is within normal limits. Duodenal diverticulum is present without inflammatory changes. Small bowel is otherwise unremarkable. Partial right colectomy noted. Anastomosis is intact. Transverse and descending colon are within normal limits. Multiple loops of small bowel extend into a ventral paraumbilical hernia. No obstruction is present. Vascular/Lymphatic: Atherosclerotic changes are present in the aorta and branch vessels without aneurysm. No significant adenopathy is present. Reproductive: Uterus unremarkable. 3.5 cm left adnexal cyst is noted. Adnexa are otherwise within normal limits. Other: Skin thickening inflammatory changes are present in the inferior pannus. Ventral hernia noted as above. No significant free  fluid or free air is present. Skin thickening is present along the perineum extending to the labia. No abscess is present. Musculoskeletal: L1 inferior endplate compression fracture appears remote. Vertebral body heights are otherwise maintained. Anterior osteophytes are fused in the thoracic spine. No focal lytic or blastic lesions are present. Degenerative changes are present at the SI joints bilaterally. Bony pelvis is otherwise normal. Degenerative changes are present in the hips. Hips are located. No acute abnormalities are present. IMPRESSION: 1. Skin thickening and inflammatory changes in the inferior pannus compatible with cellulitis. 2. Skin thickening along the perineum extending to the labia. This is consistent with clinical diagnosis of cellulitis. No deeper inflammation or abscess is present. 3. Ventral paraumbilical hernia containing loops of small bowel without obstruction. 4. Cardiomegaly without failure. 5. Coronary  artery disease. 6. Benign-appearing left renal cysts. 7. L1 inferior endplate compression fracture appears remote. 8. 3.5 cm left adnexal cyst. Recommend follow-up US in 6-12 months. Note: This recommendation does not apply to premenarchal patients and to those with increased risk (genetic, family history, elevated tumor markers or other high-risk factors) of ovarian cancer. Reference: JACR 2020 Feb; 17(2):248-254 9. Aortic Atherosclerosis (ICD10-I70.0). Electronically Signed   By: San Morelle M.D.   On: 01/04/2020 05:15   US Venous Img Lower Unilateral Right  Result Date: 01/04/2020 CLINICAL DATA:  Right leg pain and swelling EXAM: RIGHT LOWER EXTREMITY VENOUS DOPPLER ULTRASOUND TECHNIQUE: Gray-scale sonography with compression, as well as color and duplex ultrasound, were performed to evaluate the deep venous system(s) from the level of the common femoral vein through the popliteal and proximal calf veins. COMPARISON:  None. FINDINGS: VENOUS Normal compressibility of the common femoral, superficial femoral, and popliteal veins, as well as the visualized calf veins. Visualized portions of profunda femoral vein and great saphenous vein unremarkable. No filling defects to suggest DVT on grayscale or color Doppler imaging. Doppler waveforms show normal direction of venous flow, normal respiratory plasticity and response to augmentation. Limited views of the contralateral common femoral vein are unremarkable. OTHER None. Limitations: none IMPRESSION: Negative. Electronically Signed   By: Fidela Salisbury MD   On: 01/04/2020 03:03     Procedures   ____________________________________________   INITIAL IMPRESSION / MDM / [place] / ED COURSE  As part of my medical decision making, I reviewed the following data within the electronic MEDICAL RECORD NUMBER   79 year old female presented with above-stated history and physical exam consistent with perineal and right lower extremity cellulitis.   CT scan performed to evaluate for possible deep soft tissue infection.  CT consistent with cellulitis.  Patient given IV ceftriaxone 1 g.  Patient discussed with hospital staff Dr. Damita Dunnings for admission for further evaluation and management.  ____________________________________________  FINAL CLINICAL IMPRESSION(S) / ED DIAGNOSES  Final diagnoses:  Cellulitis of perineum     MEDICATIONS GIVEN DURING THIS VISIT:  Medications  nystatin cream (MYCOSTATIN) ( Topical Given 01/04/20 0524)  cefTRIAXone (ROCEPHIN) 1 g in sodium chloride 0.9 % 100 mL IVPB (0 g Intravenous Stopped 01/04/20 0457)  morphine 2 MG/ML injection 2 mg (2 mg Intravenous Given 01/04/20 0355)  ondansetron (ZOFRAN) injection 4 mg (4 mg Intravenous Given 01/04/20 0355)  iohexol (OMNIPAQUE) 300 MG/ML solution 100 mL (100 mLs Intravenous Contrast Given 01/04/20 0430)     ED Discharge Orders    None      *Please note:  Lanny Donoso was evaluated in Emergency Department on 01/04/2020 for the symptoms described in the history of present illness.  She was evaluated in the context of the global COVID-19 pandemic, which necessitated consideration that the patient might be at risk for infection with the SARS-CoV-2 virus that causes COVID-19. Institutional protocols and algorithms that pertain to the evaluation of patients at risk for COVID-19 are in a state of rapid change based on information released by regulatory bodies including the CDC and federal and state organizations. These policies and algorithms were followed during the patient's care in the ED.  Some ED evaluations and interventions may be delayed as a result of limited staffing during and after the pandemic.*  Note:  This document was prepared using Dragon voice recognition software and may include unintentional dictation errors.   Gregor Hams, MD 01/04/20 [phone]

## 2020-01-04 NOTE — ED Notes (Signed)
Pt presents today. Placed in stretcher in room. Pt with hx of CHF endorses swelling of RLE for an unknown period of time "but its been happening recently" . Pt RLE swollen with edema. [place] tech at bedside.

## 2020-01-04 NOTE — ED Notes (Addendum)
Pt resting comfortably at this time. Pt is need of a COVID test for admission purposes, pt states to this writer "I do not want that right now because I want to sleep", pt educated it is important and needs to be done so she can admitted to the hospital. This RN discussed with pt I will give her 30 min more to rest then I would need to be obtained, pt verbalized understanding. Pt also refused BP at this time due wanting rest and "not sleeping in 2 days".

## 2020-01-05 DIAGNOSIS — N1831 Chronic kidney disease, stage 3a: Secondary | ICD-10-CM

## 2020-01-05 DIAGNOSIS — L03115 Cellulitis of right lower limb: Secondary | ICD-10-CM | POA: Diagnosis not present

## 2020-01-05 DIAGNOSIS — E1122 Type 2 diabetes mellitus with diabetic chronic kidney disease: Secondary | ICD-10-CM

## 2020-01-05 DIAGNOSIS — Z794 Long term (current) use of insulin: Secondary | ICD-10-CM

## 2020-01-05 DIAGNOSIS — I5032 Chronic diastolic (congestive) heart failure: Secondary | ICD-10-CM | POA: Diagnosis not present

## 2020-01-05 DIAGNOSIS — J9611 Chronic respiratory failure with hypoxia: Secondary | ICD-10-CM | POA: Diagnosis not present

## 2020-01-05 DIAGNOSIS — E669 Obesity, unspecified: Secondary | ICD-10-CM

## 2020-01-05 LAB — CBC
HCT: 37 % (ref 36.0–46.0)
Hemoglobin: 11 g/dL — ABNORMAL LOW (ref 12.0–15.0)
MCH: 23.6 pg — ABNORMAL LOW (ref 26.0–34.0)
MCHC: 29.7 g/dL — ABNORMAL LOW (ref 30.0–36.0)
MCV: 79.4 fL — ABNORMAL LOW (ref 80.0–100.0)
Platelets: 279 10*3/uL (ref 150–400)
RBC: 4.66 MIL/uL (ref 3.87–5.11)
RDW: 21.3 % — ABNORMAL HIGH (ref 11.5–15.5)
WBC: 10.9 10*3/uL — ABNORMAL HIGH (ref 4.0–10.5)
nRBC: 0 % (ref 0.0–0.2)

## 2020-01-05 LAB — BASIC METABOLIC PANEL
Anion gap: 9 (ref 5–15)
BUN: 35 mg/dL — ABNORMAL HIGH (ref 8–23)
CO2: 29 mmol/L (ref 22–32)
Calcium: 8.2 mg/dL — ABNORMAL LOW (ref 8.9–10.3)
Chloride: 101 mmol/L (ref 98–111)
Creatinine, Ser: 1.46 mg/dL — ABNORMAL HIGH (ref 0.44–1.00)
GFR, Estimated: 34 mL/min — ABNORMAL LOW (ref >60)
Glucose, Bld: 165 mg/dL — ABNORMAL HIGH (ref 70–99)
Potassium: 4.6 mmol/L (ref 3.5–5.1)
Sodium: 139 mmol/L (ref 135–145)

## 2020-01-05 LAB — RESPIRATORY PANEL BY RT PCR (FLU A&B, COVID)
Influenza A by PCR: NEGATIVE
Influenza B by PCR: NEGATIVE
SARS Coronavirus 2 by RT PCR: NEGATIVE

## 2020-01-05 LAB — GLUCOSE, CAPILLARY
Glucose-Capillary: 238 mg/dL — ABNORMAL HIGH (ref 70–99)
Glucose-Capillary: 73 mg/dL (ref 70–99)
Glucose-Capillary: 152 mg/dL — ABNORMAL HIGH (ref 70–99)

## 2020-01-05 MED ORDER — QUETIAPINE FUMARATE 25 MG PO TABS
12.5000 mg | ORAL_TABLET | Freq: Every day | ORAL | Status: DC
  Administered 2020-01-05 – 2020-01-13 (×7): 12.5 mg via ORAL
  Filled 2020-01-05 (×8): qty 1

## 2020-01-05 MED ORDER — NYSTATIN 100000 UNIT/GM EX POWD
Freq: Two times a day (BID) | CUTANEOUS | Status: DC
  Filled 2020-01-05 (×3): qty 15

## 2020-01-05 MED ORDER — CYANOCOBALAMIN 1000 MCG/ML IJ SOLN
1000.0000 ug | Freq: Every day | INTRAMUSCULAR | Status: DC
  Administered 2020-01-07: 1000 ug via INTRAMUSCULAR
  Filled 2020-01-05 (×3): qty 1

## 2020-01-05 NOTE — Progress Notes (Signed)
Pt refusing to wear cardiac monitor telemetry. Pt removing leads education given and pt persist on not wearing monitor. Dr. Leslye Peer notified and awaiting response.

## 2020-01-05 NOTE — Consult Note (Addendum)
[facility] Face-to-Face Psychiatry Consult   Reason for Consult:   Psych issues adjustment issues general support capacity for consent  Referring Physician:   IM hospitalist   Patient Identification: Karla Sanchez MRN:  [id number] Principal Diagnosis: Cellulitis of right leg Diagnosis:  Principal Problem:   Cellulitis of right leg Active Problems:   COPD exacerbation (HCC)   Chronic diastolic CHF (congestive heart failure) (HCC)   Type 2 diabetes mellitus with stage 3 chronic kidney disease (HCC)   Chronic kidney disease, stage 3b (HCC)   Chronic respiratory failure with hypoxia (HCC)   Obesity (BMI 30.0-34.9)  Adjustment reaction --in late life  TOC note noted     Total Time spent with patient:  Close to one hour    Subjective:   Karla Sanchez is a 79 y.o. female patient admitted with   Multiple medical problems including above listed problems   Asked for capacity for consent matters   .  HPI:    Complex story but basically she has had many medical problems over time including chronic CHF and related matters.   She has only 1900--2100  per month in income and cannot afford Self pay SNF and or Assisted living.  Medicaid and 20 days of SNF have already been used.   She only has two friends who are connects in the area, her daughter travels and has to care for MS daughter in [place]  Her nephew? Grandson cannot participate in her decision making as he elects not to by [facility] report   Patient has no other funding   On and off issues and dialogue ----have ensued with different admissions.   TOC MS question noted but she has been gradually relatively improving medically and so she is in a different state at least so far.    Patient is actually very interesting and pleasant and shared many interesting experiences and memories  She was only angry briefly last pm because she felt an RN was not being empathic.   Patient has no previous psych history    She has had chronic  insomnia and is open to different kinds of meds for this   Patient in asking about her history --she understands she has progressive problems and medical sequelae that she knows will gradually worsen and that she will have increasing challenges.   However she feels she needs to get an efficiency due to very limited income   She is aware that she was in a hotel for several weeks buying time because she had an extra 12,000 or so and needed help   She knows that her recent exacerbations have held her back with her Self care but she feels that if she stabilizes she has to continue to slowly care for Self at home with cooking driving and all although she wants to retire her driving to reduce her handicap [name] expenses   She understands she has adjustment life phase issues but does not endorse major depression or severe anxiety   She has no bipolar or psychotic history   She has no past substance drug or ETOH history    Patient in interviewing in different ways does understand the nature of her illness and the implications if treated and not treated  She feels she just has to keep going with Self care ---and says if she is relatively stable she can still care for Self --She cannot afford even in home regularly   She recognizes set backs at home due to a series  of back to back medical exacerbations however          Past Psychiatric History:  No previous history   Risk to Self:  none  Risk to Others:  none  Prior Inpatient Therapy:   none  Prior Outpatient Therapy:  none   Past Medical History:  Past Medical History:  Diagnosis Date  . CHF (congestive heart failure) ([place])   . Colon cancer [facility])    s/p right and left colectomy 2020  . COPD (chronic obstructive pulmonary disease) ([place])   . Diabetes mellitus without complication ([place])   . Diastolic heart failure ([place])   . Gout   . Hypertension     Past Surgical History:  Procedure Laterality Date  . COLON SURGERY    . TUBAL  LIGATION  1970   Family History:  No family history of psychiatry  Family History  Problem Relation Age of Onset  . Lung cancer Father    Family Psychiatric  History:  None reported  Social History:   As noted see above    She is retired from [place] long term work   Husband passed away years ago   She has no close next of kin except two friends to refer to when needed   She still drives her car, still says she can cook and do basics at home when not severely Illl  TOC note noted that she does not have license or insurance  She is trying to give up her vehicle for expense also   She recognizes she has an APS rep but not clear if they can advocate for funds.    Not sure if APS  are trying to obtain guardianship as she has capacity  For consent at this time  Or are just trying to find supportive measures.   Social History   Substance and Sexual Activity  Alcohol Use Not Currently     Social History   Substance and Sexual Activity  Drug Use Never    Social History   Socioeconomic History  . Marital status: Widowed    Spouse name: Not on file  . Number of children: Not on file  . Years of education: Not on file  . Highest education level: Not on file  Occupational History  . Not on file  Tobacco Use  . Smoking status: Never Smoker  . Smokeless tobacco: Never Used  Substance and Sexual Activity  . Alcohol use: Not Currently  . Drug use: Never  . Sexual activity: Not Currently  Other Topics Concern  . Not on file  Social History Narrative  . Not on file   Social Determinants of Health   Financial Resource Strain:   . Difficulty of Paying Living Expenses: Not on file  Food Insecurity:   . Worried About Charity fundraiser in the Last Year: Not on file  . Ran Out of Food in the Last Year: Not on file  Transportation Needs:   . Lack of Transportation (Medical): Not on file  . Lack of Transportation (Non-Medical): Not on file  Physical  Activity:   . Days of Exercise per Week: Not on file  . Minutes of Exercise per Session: Not on file  Stress:   . Feeling of Stress : Not on file  Social Connections:   . Frequency of Communication with Friends and Family: Not on file  . Frequency of Social Gatherings with Friends and Family: Not on file  . Attends Religious Services:  Not on file  . Active Member of Clubs or Organizations: Not on file  . Attends Archivist Meetings: Not on file  . Marital Status: Not on file   Additional Social History:    Allergies:   Allergies  Allergen Reactions  . Codeine Nausea Only, Other (See Comments) and Swelling    Other Reaction: nausea and vomiting Other reaction(s): NAUSEA   . Other Other (See Comments)    Other reaction(s): SWELLING  Other reaction(s): MUSCLE PAIN Muscle aches Other reaction(s): MUSCLE PAIN Muscle aches Other reaction(s): MUSCLE PAIN Muscle aches   . Exenatide Nausea And Vomiting  . Levofloxacin Other (See Comments)    Other Reaction: sloughing of buccal mucosa Other reaction(s): OTHER Lost skin in mouth Patient states she can take Cipro without difficulty and has taken it many times in the past Other Reaction: sloughing of buccal mucosa   . Losartan Other (See Comments)    Weight gain Weight gain   . Tetracyclines & Related Other (See Comments)    Labs:  Results for orders placed or performed during the hospital encounter of 01/04/20 (from the past 48 hour(s))  Basic metabolic panel     Status: Abnormal   Collection Time: 01/03/20  7:59 PM  Result Value Ref Range   Sodium 143 135 - 145 mmol/L   Potassium 4.2 3.5 - 5.1 mmol/L   Chloride 102 98 - 111 mmol/L   CO2 27 22 - 32 mmol/L   Glucose, Bld 201 (H) 70 - 99 mg/dL    Comment: Glucose reference range applies only to samples taken after fasting for at least 8 hours.   BUN 38 (H) 8 - 23 mg/dL   Creatinine, Ser 1.08 (H) 0.44 - 1.00 mg/dL   Calcium 8.4 (L) 8.9 - 10.3 mg/dL   GFR,  Estimated 49 (L) >60 mL/min   Anion gap 14 5 - 15    Comment: Performed at [facility], [address] [place], [place] [address]  CBC     Status: Abnormal   Collection Time: 01/03/20  7:59 PM  Result Value Ref Range   WBC 12.8 (H) 4.0 - 10.5 K/uL   RBC 4.92 3.87 - 5.11 MIL/uL   Hemoglobin 11.5 (L) 12.0 - 15.0 g/dL   HCT 38.1 36 - 46 %   MCV 77.4 (L) 80.0 - 100.0 fL   MCH 23.4 (L) 26.0 - 34.0 pg   MCHC 30.2 30.0 - 36.0 g/dL   RDW 21.0 (H) 11.5 - 15.5 %   Platelets 315 150 - 400 K/uL   nRBC 0.0 0.0 - 0.2 %    Comment: Performed at [facility], [place], [place] [address]  Troponin I (High Sensitivity)     Status: None   Collection Time: 01/03/20  7:59 PM  Result Value Ref Range   Troponin I (High Sensitivity) 17 <18 ng/L    Comment: (NOTE) Elevated high sensitivity troponin I (hsTnI) values and significant  changes across serial measurements may suggest ACS but many other  chronic and acute conditions are known to elevate hsTnI results.  Refer to the "Links" section for chest pain algorithms and additional  guidance. Performed at [facility], [place], [place] [address]   Troponin I (High Sensitivity)     Status: None   Collection Time: 01/04/20 12:20 AM  Result Value Ref Range   Troponin I (High Sensitivity) 16 <18 ng/L    Comment: (NOTE) Elevated high sensitivity troponin  I (hsTnI) values and significant  changes across serial measurements may suggest ACS but many other  chronic and acute conditions are known to elevate hsTnI results.  Refer to the "Links" section for chest pain algorithms and additional  guidance. Performed at [facility], [address] [place], [place] [address]   Glucose, capillary     Status: Abnormal   Collection Time: 01/04/20  9:33 AM  Result Value Ref Range   Glucose-Capillary 142 (H) 70 - 99 mg/dL    Comment: Glucose reference range applies only to samples taken after  fasting for at least 8 hours.  Glucose, capillary     Status: Abnormal   Collection Time: 01/04/20  5:11 PM  Result Value Ref Range   Glucose-Capillary 152 (H) 70 - 99 mg/dL    Comment: Glucose reference range applies only to samples taken after fasting for at least 8 hours.  Respiratory Panel by RT PCR (Flu A&B, Covid) - Nasopharyngeal Swab     Status: None   Collection Time: 01/05/20  4:17 AM   Specimen: Nasopharyngeal Swab  Result Value Ref Range   SARS Coronavirus 2 by RT PCR NEGATIVE NEGATIVE    Comment: (NOTE) SARS-CoV-2 target nucleic acids are NOT DETECTED.  The SARS-CoV-2 RNA is generally detectable in upper respiratoy specimens during the acute phase of infection. The lowest concentration of SARS-CoV-2 viral copies this assay can detect is 131 copies/mL. A negative result does not preclude SARS-Cov-2 infection and should not be used as the sole basis for treatment or other patient management decisions. A negative result may occur with  improper specimen collection/handling, submission of specimen other than nasopharyngeal swab, presence of viral mutation(s) within the areas targeted by this assay, and inadequate number of viral copies (<131 copies/mL). A negative result must be combined with clinical observations, patient history, and epidemiological information. The expected result is Negative.  Fact Sheet for Patients:  PinkCheek.be  Fact Sheet for Healthcare Providers:  GravelBags.it  This test is no t yet approved or cleared by the [place] FDA and  has been authorized for detection and/or diagnosis of SARS-CoV-2 by FDA under an Emergency Use Authorization (EUA). This EUA will remain  in effect (meaning this test can be used) for the duration of the COVID-19 declaration under Section 564(b)(1) of the Act, 21 U.S.C. section 360bbb-3(b)(1), unless the authorization is terminated or revoked sooner.      Influenza A by PCR NEGATIVE NEGATIVE   Influenza B by PCR NEGATIVE NEGATIVE    Comment: (NOTE) The Xpert Xpress SARS-CoV-2/FLU/RSV assay is intended as an aid in  the diagnosis of influenza from Nasopharyngeal swab specimens and  should not be used as a sole basis for treatment. Nasal washings and  aspirates are unacceptable for Xpert Xpress SARS-CoV-2/FLU/RSV  testing.  Fact Sheet for Patients: PinkCheek.be  Fact Sheet for Healthcare Providers: GravelBags.it  This test is not yet approved or cleared by the [place] FDA and  has been authorized for detection and/or diagnosis of SARS-CoV-2 by  FDA under an Emergency Use Authorization (EUA). This EUA will remain  in effect (meaning this test can be used) for the duration of the  Covid-19 declaration under Section 564(b)(1) of the Act, 21  U.S.C. section 360bbb-3(b)(1), unless the authorization is  terminated or revoked. Performed at [facility], [address] [place], [place] [address]   Glucose, capillary     Status: Abnormal   Collection Time: 01/05/20 12:11 PM  Result Value Ref Range  Glucose-Capillary 238 (H) 70 - 99 mg/dL    Comment: Glucose reference range applies only to samples taken after fasting for at least 8 hours.  Basic metabolic panel     Status: Abnormal   Collection Time: 01/05/20  1:52 PM  Result Value Ref Range   Sodium 139 135 - 145 mmol/L   Potassium 4.6 3.5 - 5.1 mmol/L   Chloride 101 98 - 111 mmol/L   CO2 29 22 - 32 mmol/L   Glucose, Bld 165 (H) 70 - 99 mg/dL    Comment: Glucose reference range applies only to samples taken after fasting for at least 8 hours.   BUN 35 (H) 8 - 23 mg/dL   Creatinine, Ser 1.46 (H) 0.44 - 1.00 mg/dL   Calcium 8.2 (L) 8.9 - 10.3 mg/dL   GFR, Estimated 34 (L) >60 mL/min   Anion gap 9 5 - 15    Comment: Performed at [facility], [address] [place], [place] [address]  CBC     Status:  Abnormal   Collection Time: 01/05/20  1:52 PM  Result Value Ref Range   WBC 10.9 (H) 4.0 - 10.5 K/uL   RBC 4.66 3.87 - 5.11 MIL/uL   Hemoglobin 11.0 (L) 12.0 - 15.0 g/dL   HCT 37.0 36 - 46 %   MCV 79.4 (L) 80.0 - 100.0 fL   MCH 23.6 (L) 26.0 - 34.0 pg   MCHC 29.7 (L) 30.0 - 36.0 g/dL   RDW 21.3 (H) 11.5 - 15.5 %   Platelets 279 150 - 400 K/uL   nRBC 0.0 0.0 - 0.2 %    Comment: Performed at [facility], [address], [place], [place] [address]    Current Facility-Administered Medications  Medication Dose Route Frequency Provider Last Rate Last Admin  . 0.9 %  sodium chloride infusion  250 mL Intravenous PRN Agbata, Tochukwu, MD      . acetaminophen (TYLENOL) tablet 650 mg  650 mg Oral Q6H PRN Athena Masse, MD   650 mg at 01/04/20 2215   Or  . acetaminophen (TYLENOL) suppository 650 mg  650 mg Rectal Q6H PRN Athena Masse, MD      . acetaminophen (TYLENOL) tablet 650 mg  650 mg Oral Q6H PRN Agbata, Tochukwu, MD      . albuterol (PROVENTIL) (2.5 MG/3ML) 0.083% nebulizer solution 3 mL  3 mL Inhalation Q6H PRN Agbata, Tochukwu, MD      . allopurinol (ZYLOPRIM) tablet 100 mg  100 mg Oral Daily Agbata, Tochukwu, MD   100 mg at 01/05/20 1129  . aspirin EC tablet 81 mg  81 mg Oral Daily Agbata, Tochukwu, MD   81 mg at 01/05/20 1100  . cefTRIAXone (ROCEPHIN) 1 g in sodium chloride 0.9 % 100 mL IVPB  1 g Intravenous Q24H Collier Bullock, MD   Stopped at 01/05/20 1219  . cholestyramine (QUESTRAN) packet 1 packet  1 packet Oral TID WC Agbata, Tochukwu, MD      . colchicine tablet 0.6 mg  0.6 mg Oral Daily PRN Agbata, Tochukwu, MD      . cyanocobalamin ((VITAMIN B-12)) injection 1,000 mcg  1,000 mcg Intramuscular Daily Wieting, Richard, MD      . docusate sodium (COLACE) capsule 100 mg  100 mg Oral BID Agbata, Tochukwu, MD   100 mg at 01/05/20 1128  . enoxaparin (LOVENOX) injection 40 mg  40 mg Subcutaneous Q24H Athena Masse, MD      . ferrous sulfate  tablet 325 mg  325 mg  Oral Daily Agbata, Tochukwu, MD   325 mg at 01/04/20 2215  . furosemide (LASIX) tablet 80 mg  80 mg Oral BID Agbata, Tochukwu, MD   80 mg at 01/05/20 1101  . guaiFENesin (ROBITUSSIN) 100 MG/5ML solution 100 mg  5 mL Oral Q4H PRN Agbata, Tochukwu, MD      . hydrocortisone (ANUSOL-HC) 2.5 % rectal cream   Rectal BID Agbata, Tochukwu, MD   1 application at 51/88/41 2217  . insulin aspart (novoLOG) injection 0-15 Units  0-15 Units Subcutaneous TID WC Athena Masse, MD   5 Units at 01/05/20 1245  . insulin aspart (novoLOG) injection 0-5 Units  0-5 Units Subcutaneous QHS Judd Gaudier V, MD      . ipratropium-albuterol (DUONEB) 0.5-2.5 (3) MG/3ML nebulizer solution 3 mL  3 mL Nebulization Q6H PRN Agbata, Tochukwu, MD      . lactulose (CHRONULAC) 10 GM/15ML solution 10 g  10 g Oral Daily Agbata, Tochukwu, MD   10 g at 01/05/20 1058  . liver oil-zinc oxide (DESITIN) 40 % ointment 1 application  1 application Topical Daily PRN Wieting, Richard, MD      . metoprolol tartrate (LOPRESSOR) tablet 50 mg  50 mg Oral BID Agbata, Tochukwu, MD   50 mg at 01/05/20 1129  . mirtazapine (REMERON) tablet 15 mg  15 mg Oral QHS Agbata, Tochukwu, MD      . nystatin (MYCOSTATIN/NYSTOP) topical powder   Topical BID Wieting, Richard, MD      . ondansetron [facility]) tablet 4 mg  4 mg Oral Q6H PRN Athena Masse, MD       Or  . ondansetron [facility]) injection 4 mg  4 mg Intravenous Q6H PRN Athena Masse, MD      . potassium chloride SA (KLOR-CON) CR tablet 10 mEq  10 mEq Oral Daily Agbata, Tochukwu, MD   10 mEq at 01/04/20 1050  . QUEtiapine (SEROQUEL) tablet 12.5 mg  12.5 mg Oral QHS Eulas Post, MD      . senna-docusate (Senokot-S) tablet 1 tablet  1 tablet Oral Daily Agbata, Tochukwu, MD   1 tablet at 01/05/20 1101  . sodium chloride flush (NS) 0.9 % injection 3 mL  3 mL Intravenous Q12H Agbata, Tochukwu, MD   3 mL at 01/04/20 2217  . sodium chloride flush (NS) 0.9 % injection 3 mL  3 mL Intravenous PRN Agbata,  Tochukwu, MD      . zolpidem (AMBIEN) tablet 5 mg  5 mg Oral QHS PRN Agbata, Tochukwu, MD   5 mg at 01/04/20 2237    Musculoskeletal: Strength & Muscle Tone: limited but slow Gait & Station:  Needs assistance uses walker  Patient leans:  Forward with walker   Psychiatric Specialty Exam: Physical Exam  Review of Systems  Blood pressure (!) 163/72, pulse (!) 54, temperature (!) 97.5 F (36.4 C), temperature source Oral, resp. rate 17, height 5\' 6"  (1.676 m), weight 99.8 kg, SpO2 94 %.Body mass index is 35.51 kg/m.    Mental status   Cooperative interesting lady  Oriented times four  Consciousness not clouded or fluctuant Concentration and attention normal Mood pleasant affect normal Speech --normal rate tone volume fluency No frank psychosis or mania for thought process and content No shakes tics tremors dystonia or akathisia or EPS  Rapport normal eye contact normal Memory remote recent and immediate intact does several serial numbers backwards and forwards---  Knows serial presidents  Does recall 3/3 after  several minutes No active SI Hi or plans Judgement insight reliability normal Fund of knowledge and intelligence normal and above average Abstraction normal Shares and relates many interesting stories                                               Sleep:   last few days impaired   Cognition normal  Recall good ADL's limited but doable when she is well OT may help further help here  Akathisia   N/a Assets --still with strong spirit  Psychomotor normal     Treatment Plan Summary:   Caucasian elderly patient with difficult med psych social issues.  She meets capacity for consent and just does not have dollars except to continue with Self care in efficiency -----she knows there is no full answer when she becomes medically more ill and compromised but she has spoken in general to APS   She was noted to have some degree of mental change prior but she  is in normal state today   Generally her situation and decision making is intact for now so there is not enough on exam to say she does not meet capacity for consent     Supportive therapy done   She agrees to take very low dose Seroquel for better sleep   Disposition: Eulas Post, MD 01/05/2020 4:05 PM   PRN supportive follow up   She does not want to go to SNF and cannot pay the $$ she feels at this time.

## 2020-01-05 NOTE — NC FL2 (Signed)
[place] LEVEL OF CARE SCREENING TOOL     IDENTIFICATION  Patient Name: Karla Sanchez Birthdate: 06-21-1940 Sex: female Admission Date (Current Location): 01/04/2020  [employer] and [place] Number:  Engineering geologist and Address:  [facility], [address], [place], [place] [address]      Provider Number: [id number]  Attending Physician Name and Address:  Loletha Grayer, MD  Relative Name and Phone Number:  [phone]    Current Level of Care: Hospital Recommended Level of Care: [facility] Prior Approval Number:    Date Approved/Denied:   PASRR Number: [id number]  Discharge Plan: SNF    Current Diagnoses: Patient Active Problem List   Diagnosis Date Noted  . Obesity (BMI 30.0-34.9)   . Cellulitis of right leg 01/04/2020  . COPD with acute exacerbation ([place]) 12/08/2019  . Physical deconditioning 12/08/2019  . Acute respiratory failure with hypoxia ([place])   . Iron deficiency anemia   . Type 2 diabetes mellitus without complication, with long-term current use of insulin ([place])   . CHF exacerbation ([place]) 11/20/2019  . Acute on chronic diastolic (congestive) heart failure ([place]) 11/19/2019  . Acute pulmonary edema (HCC)   . Acute on chronic diastolic CHF (congestive heart failure) ([place]) 10/22/2019  . Cellulitis of right foot 10/22/2019  . Non compliance with medical treatment   . Chronic respiratory failure with hypoxia ([place])   . Hypertension   . Chronic kidney disease, stage 3b ([place])   . Acute on chronic respiratory failure with hypoxia ([place])   . Diarrhea   . COPD (chronic obstructive pulmonary disease) ([place]) 10/03/2019  . Obesity, Class III, BMI 40-49.9 (morbid obesity) ([place]) 10/03/2019  . Acute exacerbation of CHF (congestive heart failure) ([place]) 09/08/2019  . Acute CHF (congestive heart failure) ([place]) 09/07/2019  . COPD exacerbation ([place]) 07/09/2019  . Chronic diastolic CHF (congestive heart failure)  ([place]) 07/09/2019  . Type 2 diabetes mellitus with stage 3 chronic kidney disease ([place]) 07/09/2019  . Peripheral edema 07/09/2019  . Gout 07/09/2019  . Obesity, diabetes, and hypertension syndrome ([place]) 07/09/2019  . Anemia of chronic disease 07/09/2019    Orientation RESPIRATION BLADDER Height & Weight     Self, Time, Situation, Place  Normal Incontinent Weight: 220 lb (99.8 kg) Height:  5\' 6"  (167.6 cm)  BEHAVIORAL SYMPTOMS/MOOD NEUROLOGICAL BOWEL NUTRITION STATUS  Verbally abusive   Incontinent Diet  AMBULATORY STATUS COMMUNICATION OF NEEDS Skin   Total Care Verbally Normal                       Personal Care Assistance Level of Assistance  Bathing, Feeding, Dressing Bathing Assistance: Limited assistance Feeding assistance: Independent Dressing Assistance: Limited assistance     Functional Limitations Info             SPECIAL CARE FACTORS FREQUENCY     PT 5x per week                  Contractures Contractures Info: Present    Additional Factors Info                  Current Medications (01/05/2020):  This is the current hospital active medication list Current Facility-Administered Medications  Medication Dose Route Frequency Provider Last Rate Last Admin  . 0.9 %  sodium chloride infusion  250 mL Intravenous PRN Agbata, Tochukwu, MD      . acetaminophen (TYLENOL) tablet 650 mg  650 mg Oral Q6H PRN Damita Dunnings,  Waldemar Dickens, MD   650 mg at 01/04/20 2215   Or  . acetaminophen (TYLENOL) suppository 650 mg  650 mg Rectal Q6H PRN Athena Masse, MD      . acetaminophen (TYLENOL) tablet 650 mg  650 mg Oral Q6H PRN Agbata, Tochukwu, MD      . albuterol (PROVENTIL) (2.5 MG/3ML) 0.083% nebulizer solution 3 mL  3 mL Inhalation Q6H PRN Agbata, Tochukwu, MD      . allopurinol (ZYLOPRIM) tablet 100 mg  100 mg Oral Daily Agbata, Tochukwu, MD   100 mg at 01/05/20 1129  . aspirin EC tablet 81 mg  81 mg Oral Daily Agbata, Tochukwu, MD   81 mg at 01/05/20 1100  .  cefTRIAXone (ROCEPHIN) 1 g in sodium chloride 0.9 % 100 mL IVPB  1 g Intravenous Q24H Collier Bullock, MD   Stopped at 01/05/20 1219  . cholestyramine (QUESTRAN) packet 1 packet  1 packet Oral TID WC Agbata, Tochukwu, MD      . colchicine tablet 0.6 mg  0.6 mg Oral Daily PRN Agbata, Tochukwu, MD      . cyanocobalamin ((VITAMIN B-12)) injection 1,000 mcg  1,000 mcg Intramuscular Daily Wieting, Richard, MD      . docusate sodium (COLACE) capsule 100 mg  100 mg Oral BID Agbata, Tochukwu, MD   100 mg at 01/05/20 1128  . enoxaparin (LOVENOX) injection 40 mg  40 mg Subcutaneous Q24H Judd Gaudier V, MD      . ferrous sulfate tablet 325 mg  325 mg Oral Daily Agbata, Tochukwu, MD   325 mg at 01/04/20 2215  . furosemide (LASIX) tablet 80 mg  80 mg Oral BID Agbata, Tochukwu, MD   80 mg at 01/05/20 1101  . guaiFENesin (ROBITUSSIN) 100 MG/5ML solution 100 mg  5 mL Oral Q4H PRN Agbata, Tochukwu, MD      . hydrocortisone (ANUSOL-HC) 2.5 % rectal cream   Rectal BID Agbata, Tochukwu, MD   1 application at 18/84/16 2217  . insulin aspart (novoLOG) injection 0-15 Units  0-15 Units Subcutaneous TID WC Athena Masse, MD   5 Units at 01/05/20 1245  . insulin aspart (novoLOG) injection 0-5 Units  0-5 Units Subcutaneous QHS Judd Gaudier V, MD      . ipratropium-albuterol (DUONEB) 0.5-2.5 (3) MG/3ML nebulizer solution 3 mL  3 mL Nebulization Q6H PRN Agbata, Tochukwu, MD      . lactulose (CHRONULAC) 10 GM/15ML solution 10 g  10 g Oral Daily Agbata, Tochukwu, MD   10 g at 01/05/20 1058  . liver oil-zinc oxide (DESITIN) 40 % ointment 1 application  1 application Topical Daily PRN Wieting, Richard, MD      . metoprolol tartrate (LOPRESSOR) tablet 50 mg  50 mg Oral BID Agbata, Tochukwu, MD   50 mg at 01/05/20 1129  . mirtazapine (REMERON) tablet 15 mg  15 mg Oral QHS Agbata, Tochukwu, MD      . nystatin (MYCOSTATIN/NYSTOP) topical powder   Topical BID Wieting, Richard, MD      . ondansetron [facility]) tablet 4 mg  4 mg Oral  Q6H PRN Athena Masse, MD       Or  . ondansetron [facility]) injection 4 mg  4 mg Intravenous Q6H PRN Athena Masse, MD      . potassium chloride SA (KLOR-CON) CR tablet 10 mEq  10 mEq Oral Daily Agbata, Tochukwu, MD   10 mEq at 01/04/20 1050  . senna-docusate (Senokot-S) tablet 1 tablet  1 tablet Oral  Daily Agbata, Tochukwu, MD   1 tablet at 01/05/20 1101  . sodium chloride flush (NS) 0.9 % injection 3 mL  3 mL Intravenous Q12H Agbata, Tochukwu, MD   3 mL at 01/04/20 2217  . sodium chloride flush (NS) 0.9 % injection 3 mL  3 mL Intravenous PRN Agbata, Tochukwu, MD      . zolpidem (AMBIEN) tablet 5 mg  5 mg Oral QHS PRN Agbata, Tochukwu, MD   5 mg at 01/04/20 2237     Discharge Medications: Please see discharge summary for a list of discharge medications.  Relevant Imaging Results:  Relevant Lab Results:   Additional Information SS# [id number]  Adelene Amas, LCSWA

## 2020-01-05 NOTE — ED Notes (Signed)
Phlebotomy at bedside, patient refusing AM labs.

## 2020-01-05 NOTE — Progress Notes (Signed)
Tele d/c and cardiac monitor tech notified.

## 2020-01-05 NOTE — Plan of Care (Signed)

## 2020-01-05 NOTE — TOC Initial Note (Signed)
Transition of Care [facility]) - Initial/Assessment Note    Patient Details  Name: Karla Sanchez MRN: [id number] Date of Birth: 1940-10-26  Transition of Care [facility]) CM/SW Contact:    Ova Freshwater Phone Number: [phone] 01/05/2020, 1:12 PM  Clinical Narrative:                  Patient presents to [facility] ED due to leg pain.  Patient stated she left the [place] area and drove to the [facility] and was told they were booked for a convention.  Patient stated she had leg pain and needed to come to the hospital.  Patient stated she got lost driving to the hospital and ran out of gas in the back parking lot of the hospital and had to call EMS to get her to the ED.  Patient stated she was feeling unwell while in [place] and she spent two weeks at the hospital.  Patient states she is ready to go to a SNF and stay there for long term care.  CSW asked patient if her financial situation had changed and she said not.  Patient stated she receives 2,100 per month, which the same amount she received last time she was in the hospital.  CSW explained to patient the amount was too much to receive Medicaid and not enough to pay for private care.  During the conversation patient mentioned numerous times that her [name] was in the parking lot and it ran out of gas and she needed someone to fill it up.  CSW asked patient who she was visiting in [place], and she stated it was her friend [name].  Patient stated she was trying to find an apartment to live in but was unable to find something she liked.  CSW asked patient if she had any family she could live with and she stated she had a grandson living in [place] and his name is [name]. This patient does not have children and her only next-of-kin is her nephew [name] [phone], who has made it very clear he does not want to participate in his aunt's medical care decision making.  In speaking with the patient, who has been in the [facility] ED and admitted numerous  times, CSW noticed more circumstantial speech, less connectivity of thought process, difficulty following conversation thread, and memory lapses; for example stating she has a grandson named Personal assistant in [place], [place], when she does not have children of her own.  CSW updated Attending on my concerns for patient's capacity and asked about a psych consult for the patient due to these reasons.  CSW also made Attending aware that the patient is driving without a license or insurance.  Expected Discharge Plan: Skilled Nursing Facility Barriers to Discharge: Continued Medical Work up   Patient Goals and CMS Choice Patient states their goals for this hospitalization and ongoing recovery are:: "I would like to go to a skilled nursing facility. I liked [facility] when I was there in April." CMS Medicare.gov Compare Post Acute Care list provided to:: Patient Choice offered to / list presented to : Patient  Expected Discharge Plan and Services Expected Discharge Plan: [place] In-house Referral: Clinical Social Work   Post Acute Care Choice: Durable Medical Equipment Living arrangements for the past 2 months: Hotel/Motel  Prior Living Arrangements/Services Living arrangements for the past 2 months: Hotel/Motel Lives with:: Self Patient language and need for interpreter reviewed:: Yes Do you feel safe going back to the place where you live?: Yes      Need for Family Participation in Patient Care: No (Comment) (Patient does not have family support) Care giver support system in place?: No (comment) (Patient does not have family support) Current home services: DME ([employer] for oxygen.) Criminal Activity/Legal Involvement Pertinent to Current Situation/Hospitalization: No - Comment as needed  Activities of Daily Living Home Assistive Devices/Equipment: Wheelchair ADL Screening (condition at time of admission) Patient's cognitive ability adequate to  safely complete daily activities?: Yes Is the patient deaf or have difficulty hearing?: No Does the patient have difficulty seeing, even when wearing glasses/contacts?: No Does the patient have difficulty concentrating, remembering, or making decisions?: No Patient able to express need for assistance with ADLs?: Yes Does the patient have difficulty dressing or bathing?: No Independently performs ADLs?: No Communication: Independent Dressing (OT): Independent Grooming: Independent Feeding: Independent Bathing: Needs assistance Is this a change from baseline?: Pre-admission baseline Toileting: Independent In/Out Bed: Needs assistance Is this a change from baseline?: Pre-admission baseline Walks in Home: Dependent Is this a change from baseline?: Pre-admission baseline Does the patient have difficulty walking or climbing stairs?: Yes Weakness of Legs: Both Weakness of Arms/Hands: None  Permission Sought/Granted   Permission granted to share information with : Yes, Verbal Permission Granted     Permission granted to share info w AGENCY: [facility] DSS/APS        Emotional Assessment Appearance:: Appears older than stated age Attitude/Demeanor/Rapport: Engaged Affect (typically observed): Stable Orientation: : Oriented to Self, Oriented to Place, Oriented to  Time, Oriented to Situation Alcohol / Substance Use: Not Applicable Psych Involvement: No (comment)  Admission diagnosis:  Cellulitis of perineum [L03.315] Cellulitis of right leg [L03.115] Patient Active Problem List   Diagnosis Date Noted  . Cellulitis of right leg 01/04/2020  . COPD with acute exacerbation ([place]) 12/08/2019  . Physical deconditioning 12/08/2019  . Acute respiratory failure with hypoxia ([place])   . Iron deficiency anemia   . Type 2 diabetes mellitus without complication, with long-term current use of insulin ([place])   . CHF exacerbation ([place]) 11/20/2019  . Acute on chronic  diastolic (congestive) heart failure ([place]) 11/19/2019  . Acute pulmonary edema (HCC)   . Acute on chronic diastolic CHF (congestive heart failure) ([place]) 10/22/2019  . Cellulitis of right foot 10/22/2019  . Non compliance with medical treatment   . Chronic respiratory failure with hypoxia ([place])   . Hypertension   . Chronic kidney disease, stage 3b ([place])   . Acute on chronic respiratory failure with hypoxia ([place])   . Diarrhea   . COPD (chronic obstructive pulmonary disease) ([place]) 10/03/2019  . Obesity, Class III, BMI 40-49.9 (morbid obesity) ([place]) 10/03/2019  . Acute exacerbation of CHF (congestive heart failure) ([place]) 09/08/2019  . Acute CHF (congestive heart failure) ([place]) 09/07/2019  . COPD exacerbation ([place]) 07/09/2019  . Chronic diastolic CHF (congestive heart failure) ([place]) 07/09/2019  . Type 2 diabetes mellitus with stage 3 chronic kidney disease ([place]) 07/09/2019  . Peripheral edema 07/09/2019  . Gout 07/09/2019  . Obesity, diabetes, and hypertension syndrome ([place]) 07/09/2019  . Anemia of chronic disease 07/09/2019   PCP:  Alfredia Client, MD Pharmacy:   [facility] [address]  [place] [address] Phone: [phone] Fax: [phone]  [facility] [place] [place] - [address] [place] [address] Phone: [phone] Fax: [phone]     Social Determinants of Health (SDOH) Interventions    Readmission Risk Interventions Readmission Risk Prevention Plan 10/24/2019  Medication Review ([place]) Complete  HRI or [place] Patient Refused

## 2020-01-05 NOTE — Progress Notes (Signed)
Pt refused scheduled medication.Education given

## 2020-01-05 NOTE — Consult Note (Addendum)
WOC consult requested for skin folds and buttocks; this was performed yesterday for skin folds.  Refer to progress notes on 10/14 for assessment, and topical treatment orders have been provided for bedside nurses to perform to promote healing.  Buttocks are reported to be red, raw and painful.  Description is consistent with moisture associated skin damage and probable candita.  Discussed this complaint with the patient yesterday, but did not visualize the location since pt has a high BMI and was sitting in a chair.  Desitin was ordered at that time to repel moisture and promote healing.  Will add antifungal powder to those orders.  Topical treatment orders provided for bedside nurses as follows: Apply perineum and buttocks BID and PRN when patient requests, then cover with antifungal powder. Please re-consult if further assistance is needed.  Thank-you,  Julien Girt MSN, [place], [place], [place], [place]

## 2020-01-05 NOTE — Progress Notes (Signed)
Patient ID: Karla Sanchez, female   DOB: 1941/01/22, 79 y.o.   MRN: [id number] [facility] PROGRESS NOTE  Karla Sanchez YTK:[id number] DOB: 12/18/1940 DOA: 01/04/2020 PCP: Karla Client, MD  HPI/Subjective: Patient living at a hotel.  Came in for redness of her lower extremity.  Admitted with cellulitis.  Also concerned about her buttocks being red.  Patient states that she is swollen and also has a history of congestive heart failure.  Objective: Vitals:   01/05/20 1209 01/05/20 1210  BP:  (!) 144/22  Pulse:  (!) 51  Resp:  16  Temp: 97.7 F (36.5 C)   SpO2:  100%   No intake or output data in the 24 hours ending 01/05/20 1407 Filed Weights   01/03/20 1956  Weight: 99.8 kg    ROS: Review of Systems  Respiratory: Positive for shortness of breath. Negative for cough.   Cardiovascular: Negative for chest pain.  Gastrointestinal: Negative for abdominal pain, nausea and vomiting.   Exam: Physical Exam HENT:     Head: Normocephalic.     Mouth/Throat:     Pharynx: No oropharyngeal exudate.  Eyes:     General: Lids are normal.     Conjunctiva/sclera: Conjunctivae normal.     Pupils: Pupils are equal, round, and reactive to light.  Cardiovascular:     Rate and Rhythm: Normal rate and regular rhythm.     Heart sounds: Normal heart sounds, S1 normal and S2 normal.  Pulmonary:     Breath sounds: No decreased breath sounds, wheezing, rhonchi or rales.  Abdominal:     Palpations: Abdomen is soft.     Tenderness: There is no abdominal tenderness.  Musculoskeletal:     Right upper leg: No swelling.     Left upper leg: No swelling.  Skin:    General: Skin is warm.     Comments: Right lower extremity some erythema. Buttocks and under skin folds erythematous.  Neurological:     Mental Status: She is alert.     Comments: Answers all questions appropriately.       Data Reviewed: Basic Metabolic Panel: Recent Labs  Lab 01/03/20 1959  NA 143  K  4.2  CL 102  CO2 27  GLUCOSE 201*  BUN 38*  CREATININE 1.08*  CALCIUM 8.4*   CBC: Recent Labs  Lab 01/03/20 1959  WBC 12.8*  HGB 11.5*  HCT 38.1  MCV 77.4*  PLT 315   BNP (last 3 results) Recent Labs    11/19/19 2013 11/25/19 1738 12/08/19 1448  BNP 76.0 81.8 57.9    CBG: Recent Labs  Lab 01/04/20 0933 01/04/20 1711 01/05/20 1211  GLUCAP 142* 152* 238*    Recent Results (from the past 240 hour(s))  Respiratory Panel by RT PCR (Flu A&B, Covid) - Nasopharyngeal Swab     Status: None   Collection Time: 01/05/20  4:17 AM   Specimen: Nasopharyngeal Swab  Result Value Ref Range Status   SARS Coronavirus 2 by RT PCR NEGATIVE NEGATIVE Final    Comment: (NOTE) SARS-CoV-2 target nucleic acids are NOT DETECTED.  The SARS-CoV-2 RNA is generally detectable in upper respiratoy specimens during the acute phase of infection. The lowest concentration of SARS-CoV-2 viral copies this assay can detect is 131 copies/mL. A negative result does not preclude SARS-Cov-2 infection and should not be used as the sole basis for treatment or other patient management decisions. A negative result may occur with  improper specimen collection/handling, submission of specimen other than nasopharyngeal swab,  presence of viral mutation(s) within the areas targeted by this assay, and inadequate number of viral copies (<131 copies/mL). A negative result must be combined with clinical observations, patient history, and epidemiological information. The expected result is Negative.  Fact Sheet for Patients:  PinkCheek.be  Fact Sheet for Healthcare Providers:  GravelBags.it  This test is no t yet approved or cleared by the [place] FDA and  has been authorized for detection and/or diagnosis of SARS-CoV-2 by FDA under an Emergency Use Authorization (EUA). This EUA will remain  in effect (meaning this test can be used) for the  duration of the COVID-19 declaration under Section 564(b)(1) of the Act, 21 U.S.C. section 360bbb-3(b)(1), unless the authorization is terminated or revoked sooner.     Influenza A by PCR NEGATIVE NEGATIVE Final   Influenza B by PCR NEGATIVE NEGATIVE Final    Comment: (NOTE) The Xpert Xpress SARS-CoV-2/FLU/RSV assay is intended as an aid in  the diagnosis of influenza from Nasopharyngeal swab specimens and  should not be used as a sole basis for treatment. Nasal washings and  aspirates are unacceptable for Xpert Xpress SARS-CoV-2/FLU/RSV  testing.  Fact Sheet for Patients: PinkCheek.be  Fact Sheet for Healthcare Providers: GravelBags.it  This test is not yet approved or cleared by the [place] FDA and  has been authorized for detection and/or diagnosis of SARS-CoV-2 by  FDA under an Emergency Use Authorization (EUA). This EUA will remain  in effect (meaning this test can be used) for the duration of the  Covid-19 declaration under Section 564(b)(1) of the Act, 21  U.S.C. section 360bbb-3(b)(1), unless the authorization is  terminated or revoked. Performed at [facility], [address] [place], [place] [address]      Studies: DG Chest 2 View  Result Date: 01/03/2020 CLINICAL DATA:  [age]-year-old female with shortness of breath. EXAM: CHEST - 2 VIEW COMPARISON:  Chest radiograph dated 12/08/2019. FINDINGS: Minimal left lung base atelectasis/scarring. Blunting of the left costophrenic angle may be related to scarring. Trace left pleural effusion is not excluded clinical correlation is recommended. The right lung is clear. No pneumothorax. The cardiac silhouette is within limits. Atherosclerotic calcification of the aorta. Degenerative changes of the spine. No acute osseous pathology. IMPRESSION: Minimal left lung base atelectasis/scarring. Electronically Signed   By: Anner Crete M.D.   On: 01/03/2020  20:22   CT ABDOMEN PELVIS W CONTRAST  Result Date: 01/04/2020 CLINICAL DATA:  Abdominal pain. Fever. Perineal cellulitis. Question labial abscess. EXAM: CT ABDOMEN AND PELVIS WITH CONTRAST TECHNIQUE: Multidetector CT imaging of the abdomen and pelvis was performed using the standard protocol following bolus administration of intravenous contrast. CONTRAST:  132mL OMNIPAQUE IOHEXOL 300 MG/ML  SOLN COMPARISON:  None. FINDINGS: Lower chest: Mild dependent atelectasis is present. The heart is enlarged. Coronary artery calcifications are present. No significant pericardial effusion is present. Hepatobiliary: No focal liver abnormality is seen. No gallstones, gallbladder wall thickening, or biliary dilatation. Pancreas: Unremarkable. No pancreatic ductal dilatation or surrounding inflammatory changes. Spleen: Water density the lesion in the anterior aspect of the spleen measures 5.7 cm compatible with a simple cyst. No other focal lesions are present. Adrenals/Urinary Tract: Adrenal glands are within normal limits bilaterally. Water density exophytic cyst is present posteriorly at the upper pole of the left kidney. Lesion measures 2.0 cm. 1.7 cm simple cyst is present at the lower pole of the left kidney. Lobulations are noted in both kidneys without other discrete mass. No stone or obstruction is present.  The ureters are within normal limits bilaterally. The urinary bladder is normal. Stomach/Bowel: Stomach is within normal limits. Duodenal diverticulum is present without inflammatory changes. Small bowel is otherwise unremarkable. Partial right colectomy noted. Anastomosis is intact. Transverse and descending colon are within normal limits. Multiple loops of small bowel extend into a ventral paraumbilical hernia. No obstruction is present. Vascular/Lymphatic: Atherosclerotic changes are present in the aorta and branch vessels without aneurysm. No significant adenopathy is present. Reproductive: Uterus unremarkable.  3.5 cm left adnexal cyst is noted. Adnexa are otherwise within normal limits. Other: Skin thickening inflammatory changes are present in the inferior pannus. Ventral hernia noted as above. No significant free fluid or free air is present. Skin thickening is present along the perineum extending to the labia. No abscess is present. Musculoskeletal: L1 inferior endplate compression fracture appears remote. Vertebral body heights are otherwise maintained. Anterior osteophytes are fused in the thoracic spine. No focal lytic or blastic lesions are present. Degenerative changes are present at the SI joints bilaterally. Bony pelvis is otherwise normal. Degenerative changes are present in the hips. Hips are located. No acute abnormalities are present. IMPRESSION: 1. Skin thickening and inflammatory changes in the inferior pannus compatible with cellulitis. 2. Skin thickening along the perineum extending to the labia. This is consistent with clinical diagnosis of cellulitis. No deeper inflammation or abscess is present. 3. Ventral paraumbilical hernia containing loops of small bowel without obstruction. 4. Cardiomegaly without failure. 5. Coronary artery disease. 6. Benign-appearing left renal cysts. 7. L1 inferior endplate compression fracture appears remote. 8. 3.5 cm left adnexal cyst. Recommend follow-up US in 6-12 months. Note: This recommendation does not apply to premenarchal patients and to those with increased risk (genetic, family history, elevated tumor markers or other high-risk factors) of ovarian cancer. Reference: JACR 2020 Feb; 17(2):248-254 9. Aortic Atherosclerosis (ICD10-I70.0). Electronically Signed   By: San Morelle M.D.   On: 01/04/2020 05:15   US Venous Img Lower Unilateral Right  Result Date: 01/04/2020 CLINICAL DATA:  Right leg pain and swelling EXAM: RIGHT LOWER EXTREMITY VENOUS DOPPLER ULTRASOUND TECHNIQUE: Gray-scale sonography with compression, as well as color and duplex ultrasound,  were performed to evaluate the deep venous system(s) from the level of the common femoral vein through the popliteal and proximal calf veins. COMPARISON:  None. FINDINGS: VENOUS Normal compressibility of the common femoral, superficial femoral, and popliteal veins, as well as the visualized calf veins. Visualized portions of profunda femoral vein and great saphenous vein unremarkable. No filling defects to suggest DVT on grayscale or color Doppler imaging. Doppler waveforms show normal direction of venous flow, normal respiratory plasticity and response to augmentation. Limited views of the contralateral common femoral vein are unremarkable. OTHER None. Limitations: none IMPRESSION: Negative. Electronically Signed   By: Fidela Salisbury MD   On: 01/04/2020 03:03    Scheduled Meds: . allopurinol  100 mg Oral Daily  . aspirin EC  81 mg Oral Daily  . cholestyramine  1 packet Oral TID WC  . cyanocobalamin  1,000 mcg Intramuscular Daily  . docusate sodium  100 mg Oral BID  . enoxaparin (LOVENOX) injection  40 mg Subcutaneous Q24H  . ferrous sulfate  325 mg Oral Daily  . furosemide  80 mg Oral BID  . hydrocortisone   Rectal BID  . insulin aspart  0-15 Units Subcutaneous TID WC  . insulin aspart  0-5 Units Subcutaneous QHS  . lactulose  10 g Oral Daily  . metoprolol tartrate  50 mg Oral BID  .  mirtazapine  15 mg Oral QHS  . nystatin   Topical BID  . potassium chloride  10 mEq Oral Daily  . senna-docusate  1 tablet Oral Daily  . sodium chloride flush  3 mL Intravenous Q12H   Continuous Infusions: . sodium chloride    . cefTRIAXone (ROCEPHIN)  IV Stopped (01/05/20 1219)    Assessment/Plan:  1. Right lower extremity cellulitis.  Patient placed on Rocephin. 2. Erythema buttocks and under skin folds.  Could be fungal in nature.  No signs of abscess.  Nystatin powder and Desitin cream.  Wound care consultation. 3. Chronic diastolic congestive heart failure continue Lasix.  Will also give TED hose.   Patient on metoprolol. 4. 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  Patient on sliding scale insulin 5. Transitional care team concerned about her cognition and wanted a psychiatric consultation for capacity.  I saw that her B12 level is also a little low I will replace B12.  Check an RPR.  TSH in normal range.  Calcium in normal range. 6. Obesity with a BMI of 35.51.  Daily weights. 7. COPD.  Chronic respiratory failure with hypoxia on oxygen.  Respiratory status stable. 8. 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        Code Status:     Code Status Orders  (From admission, onward)         Start     Ordered   01/04/20 1000  Full code  Continuous        01/04/20 1001        Code Status History    Date Active Date Inactive Code Status Order ID Comments User Context   01/04/2020 0600 01/04/2020 1001 Full Code [id number]  Athena Masse, MD ED   12/08/2019 1404 12/09/2019 2112 Full Code [id number]  Collier Bullock, MD ED   11/19/2019 2204 11/24/2019 1800 Full Code [id number]  Mansy, Arvella Merles, MD ED   10/22/2019 2231 10/31/2019 0440 Full Code [id number]  Elwyn Reach, MD ED   10/10/2019 0723 10/10/2019 1917 Full Code [id number]  Ivor Costa, MD ED   10/03/2019 0919 10/04/2019 2033 Full Code [id number]  Collier Bullock, MD Inpatient   09/07/2019 0733 09/13/2019 0002 DNR [id number]  Mansy, Arvella Merles, MD ED   09/07/2019 0256 09/07/2019 0733 DNR [id number]  Mansy, Arvella Merles, MD ED   08/01/2019 1102 08/03/2019 2223 DNR [id number]  Jimmy Footman, NP ED   07/11/2019 1242 07/13/2019 2316 DNR [id number]  Asencion Gowda, NP Inpatient   07/09/2019 2123 07/11/2019 1242 Partial Code [id number]  Neena Rhymes, MD ED   07/09/2019 2014 07/09/2019 2122 Full Code [id number]  Norins, Heinz Knuckles, MD ED   Advance Care Planning Activity     Disposition Plan: Status is: Inpatient  Dispo: The patient is from: [facility]              Anticipated d/c is to: Patient requests rehab.              Anticipated d/c  date is: Likely will need a couple days here in the hospital              Patient currently being treated for cellulitis with IV antibiotics.  Antibiotics:  Rocephin  Time spent: 28 minutes  [place]

## 2020-01-05 NOTE — ED Notes (Signed)
Patient refusing vital signs and AM labs at this time.

## 2020-01-05 NOTE — ED Notes (Signed)
Unit states that RN is "in progression" and unable to take report. Extension provided for call-back.

## 2020-01-05 NOTE — Progress Notes (Signed)
PT Cancellation Note  Patient Details Name: Karla Sanchez MRN: [id number] DOB: 1940/11/04   Cancelled Treatment:    Reason Eval/Treat Not Completed: Patient declined, no reason specified. Patient states "just let me sleep." Declines PT at this time. Will re-attempt tomorrow.    Santos Hardwick 01/05/2020, 3:24 PM

## 2020-01-05 NOTE — ED Notes (Signed)
Pt is AOX4, NAD noted. Pt sitting in wheelchair with nasal cannula.

## 2020-01-05 NOTE — ED Notes (Signed)
Attempted to call report, unit not ready for pt at this time. Will call back.

## 2020-01-05 NOTE — ED Notes (Signed)
Pt moved to room 32. Pt asked to be sat up in the bed. When RN asked pt to help by pushing herself up in the bed. Pt began screaming at RN, refusing to help herself. Pt stated that "I'm not going to do it, I can't do it. Pt raised her voice, cursed at RN, called RN names. Pt pulled herself up to a seated position and screamed further, making physical threats toward RN. Pt continued to behave in this manner regardless of offers of assistance by multiple RN's. Pt rejected all offers of assistance and refused to compliy w/ safety requests. Pt has asked to complain to the hospital administrator.

## 2020-01-06 DIAGNOSIS — L03115 Cellulitis of right lower limb: Secondary | ICD-10-CM | POA: Diagnosis not present

## 2020-01-06 DIAGNOSIS — B369 Superficial mycosis, unspecified: Secondary | ICD-10-CM | POA: Diagnosis not present

## 2020-01-06 DIAGNOSIS — I5032 Chronic diastolic (congestive) heart failure: Secondary | ICD-10-CM | POA: Diagnosis not present

## 2020-01-06 DIAGNOSIS — E1122 Type 2 diabetes mellitus with diabetic chronic kidney disease: Secondary | ICD-10-CM | POA: Diagnosis not present

## 2020-01-06 LAB — BASIC METABOLIC PANEL
Anion gap: 10 (ref 5–15)
BUN: 34 mg/dL — ABNORMAL HIGH (ref 8–23)
CO2: 28 mmol/L (ref 22–32)
Calcium: 8.2 mg/dL — ABNORMAL LOW (ref 8.9–10.3)
Chloride: 100 mmol/L (ref 98–111)
Creatinine, Ser: 1.32 mg/dL — ABNORMAL HIGH (ref 0.44–1.00)
GFR, Estimated: 38 mL/min — ABNORMAL LOW (ref >60)
Glucose, Bld: 159 mg/dL — ABNORMAL HIGH (ref 70–99)
Potassium: 4.5 mmol/L (ref 3.5–5.1)
Sodium: 138 mmol/L (ref 135–145)

## 2020-01-06 LAB — GLUCOSE, CAPILLARY
Glucose-Capillary: 160 mg/dL — ABNORMAL HIGH (ref 70–99)
Glucose-Capillary: 144 mg/dL — ABNORMAL HIGH (ref 70–99)
Glucose-Capillary: 176 mg/dL — ABNORMAL HIGH (ref 70–99)

## 2020-01-06 LAB — CBC WITH DIFFERENTIAL/PLATELET
Abs Immature Granulocytes: 0.03 10*3/uL (ref 0.00–0.07)
Basophils Absolute: 0 10*3/uL (ref 0.0–0.1)
Basophils Relative: 0 %
Eosinophils Absolute: 0.5 10*3/uL (ref 0.0–0.5)
Eosinophils Relative: 5 %
HCT: 35.5 % — ABNORMAL LOW (ref 36.0–46.0)
Hemoglobin: 10.4 g/dL — ABNORMAL LOW (ref 12.0–15.0)
Immature Granulocytes: 0 %
Lymphocytes Relative: 20 %
Lymphs Abs: 2.1 10*3/uL (ref 0.7–4.0)
MCH: 23.3 pg — ABNORMAL LOW (ref 26.0–34.0)
MCHC: 29.3 g/dL — ABNORMAL LOW (ref 30.0–36.0)
MCV: 79.4 fL — ABNORMAL LOW (ref 80.0–100.0)
Monocytes Absolute: 0.8 10*3/uL (ref 0.1–1.0)
Monocytes Relative: 8 %
Neutro Abs: 6.8 10*3/uL (ref 1.7–7.7)
Neutrophils Relative %: 67 %
Platelets: 268 10*3/uL (ref 150–400)
RBC: 4.47 MIL/uL (ref 3.87–5.11)
RDW: 20.9 % — ABNORMAL HIGH (ref 11.5–15.5)
WBC: 10.2 10*3/uL (ref 4.0–10.5)
nRBC: 0 % (ref 0.0–0.2)

## 2020-01-06 LAB — HEMOGLOBIN A1C
Hgb A1c MFr Bld: 8.2 % — ABNORMAL HIGH (ref 4.8–5.6)
Mean Plasma Glucose: 189 mg/dL

## 2020-01-06 MED ORDER — FLUTICASONE PROPIONATE 50 MCG/ACT NA SUSP
2.0000 | Freq: Every day | NASAL | Status: DC
  Administered 2020-01-08 – 2020-01-10 (×3): 2 via NASAL
  Filled 2020-01-06: qty 16

## 2020-01-06 MED ORDER — CEPHALEXIN 500 MG PO CAPS
500.0000 mg | ORAL_CAPSULE | Freq: Three times a day (TID) | ORAL | Status: AC
  Administered 2020-01-06 – 2020-01-11 (×15): 500 mg via ORAL
  Filled 2020-01-06 (×15): qty 1

## 2020-01-06 MED ORDER — METOPROLOL TARTRATE 25 MG PO TABS
12.5000 mg | ORAL_TABLET | Freq: Two times a day (BID) | ORAL | Status: DC
  Administered 2020-01-06 – 2020-01-14 (×12): 12.5 mg via ORAL
  Filled 2020-01-06 (×15): qty 1

## 2020-01-06 MED ORDER — TRAZODONE HCL 50 MG PO TABS
50.0000 mg | ORAL_TABLET | Freq: Every evening | ORAL | Status: DC | PRN
  Administered 2020-01-06 – 2020-01-11 (×4): 50 mg via ORAL
  Filled 2020-01-06 (×4): qty 1

## 2020-01-06 MED ORDER — LORATADINE 10 MG PO TABS
10.0000 mg | ORAL_TABLET | Freq: Every day | ORAL | Status: DC
  Administered 2020-01-08 – 2020-01-14 (×7): 10 mg via ORAL
  Filled 2020-01-06 (×7): qty 1

## 2020-01-06 MED ORDER — HYDROCODONE-ACETAMINOPHEN 5-325 MG PO TABS
1.0000 | ORAL_TABLET | ORAL | Status: DC | PRN
  Administered 2020-01-08 – 2020-01-12 (×4): 1 via ORAL
  Filled 2020-01-06 (×5): qty 1

## 2020-01-06 MED ORDER — INSULIN GLARGINE 100 UNIT/ML ~~LOC~~ SOLN
8.0000 [IU] | Freq: Every day | SUBCUTANEOUS | Status: DC
  Administered 2020-01-09 – 2020-01-14 (×5): 8 [IU] via SUBCUTANEOUS
  Filled 2020-01-06 (×10): qty 0.08

## 2020-01-06 NOTE — Progress Notes (Signed)
PT Cancellation Note  Patient Details Name: Karla Sanchez MRN: [id number] DOB: Feb 14, 1941   Cancelled Treatment:    Reason Eval/Treat Not Completed: Patient declined, no reason specified.  Has been up to [facility] but reporting her pain on thighs is not managed by pain meds.  Follow up as time and pt allow.   Ramond Dial 01/06/2020, 12:03 PM   Mee Hives, PT [facility] Acute Rehab Dept. Number: [place] and [place]

## 2020-01-06 NOTE — Progress Notes (Signed)
PT Cancellation Note  Patient Details Name: Karla Sanchez MRN: [id number] DOB: Jun 03, 1940   Cancelled Treatment:    Reason Eval/Treat Not Completed: Other (comment).  Pt has refused meds with nursing as PT arrived, and then told PT no to therapy session.  Has become upset with PT trying to talk with her about trying to work tomorrow.  Pt told PT "bye."  Will try again at another time.   Ramond Dial 01/06/2020, 1:48 PM   Mee Hives, PT [facility] Acute Rehab Dept. Number: [place] and [place]

## 2020-01-06 NOTE — Progress Notes (Signed)
Patient ID: Karla Sanchez, female   DOB: [birth year], 79 y.o.   MRN: [id number] [facility] PROGRESS NOTE  Karla Sanchez [id number] DOB: 10/01/1940 DOA: 01/04/2020 PCP: Alfredia Client, MD  HPI/Subjective: Patient feels okay.  Right foot a little bit better.  Concerned about her buttocks.  No abdominal pain.  Breathing okay.  Admitted with right lower extremity cellulitis.  Objective: Vitals:   01/05/20 1957 01/06/20 0744  BP: (!) 142/48 (!) 143/42  Pulse: (!) 53 64  Resp: 18 20  Temp: 98.5 F (36.9 C) 98.4 F (36.9 C)  SpO2: 99% 99%    Intake/Output Summary (Last 24 hours) at 01/06/2020 1203 Last data filed at 01/06/2020 1017 Gross per 24 hour  Intake 320 ml  Output --  Net 320 ml   Filed Weights   01/03/20 1956  Weight: 99.8 kg    ROS: Review of Systems  Respiratory: Positive for shortness of breath.   Cardiovascular: Negative for chest pain.  Gastrointestinal: Negative for abdominal pain.   Exam: Physical Exam HENT:     Head: Normocephalic.     Mouth/Throat:     Pharynx: Oropharyngeal exudate present.  Eyes:     General: Lids are normal.     Conjunctiva/sclera: Conjunctivae normal.     Pupils: Pupils are equal, round, and reactive to light.  Cardiovascular:     Rate and Rhythm: Normal rate and regular rhythm.     Heart sounds: Normal heart sounds, S1 normal and S2 normal.  Pulmonary:     Breath sounds: No decreased breath sounds, wheezing, rhonchi or rales.  Abdominal:     Palpations: Abdomen is soft.     Tenderness: There is no abdominal tenderness.  Musculoskeletal:     Right ankle: Swelling present.     Left ankle: Swelling present.  Skin:    General: Skin is warm.     Comments: Right lower extremity cellulitis improving Buttock erythema a little bit less today than it was yesterday but also covered with cream.  Neurological:     Mental Status: She is alert.     Comments: Answers questions appropriately       Data  Reviewed: Basic Metabolic Panel: Recent Labs  Lab 01/03/20 1959 01/05/20 1352 01/06/20 0748  NA 143 139 138  K 4.2 4.6 4.5  CL 102 101 100  CO2 27 29 28   GLUCOSE 201* 165* 159*  BUN 38* 35* 34*  CREATININE 1.08* 1.46* 1.32*  CALCIUM 8.4* 8.2* 8.2*   CBC: Recent Labs  Lab 01/03/20 1959 01/05/20 1352 01/06/20 0748  WBC 12.8* 10.9* 10.2  NEUTROABS  --   --  6.8  HGB 11.5* 11.0* 10.4*  HCT 38.1 37.0 35.5*  MCV 77.4* 79.4* 79.4*  PLT 315 279 268   BNP (last 3 results) Recent Labs    11/19/19 2013 11/25/19 1738 12/08/19 1448  BNP 76.0 81.8 57.9     CBG: Recent Labs  Lab 01/05/20 1211 01/05/20 1648 01/05/20 2042 01/06/20 0739 01/06/20 1128  GLUCAP 238* 73 152* 160* 144*    Recent Results (from the past 240 hour(s))  Respiratory Panel by RT PCR (Flu A&B, Covid) - Nasopharyngeal Swab     Status: None   Collection Time: 01/05/20  4:17 AM   Specimen: Nasopharyngeal Swab  Result Value Ref Range Status   SARS Coronavirus 2 by RT PCR NEGATIVE NEGATIVE Final    Comment: (NOTE) SARS-CoV-2 target nucleic acids are NOT DETECTED.  The SARS-CoV-2 RNA is generally detectable in upper respiratoy  specimens during the acute phase of infection. The lowest concentration of SARS-CoV-2 viral copies this assay can detect is 131 copies/mL. A negative result does not preclude SARS-Cov-2 infection and should not be used as the sole basis for treatment or other patient management decisions. A negative result may occur with  improper specimen collection/handling, submission of specimen other than nasopharyngeal swab, presence of viral mutation(s) within the areas targeted by this assay, and inadequate number of viral copies (<131 copies/mL). A negative result must be combined with clinical observations, patient history, and epidemiological information. The expected result is Negative.  Fact Sheet for Patients:  PinkCheek.be  Fact Sheet for  Healthcare Providers:  GravelBags.it  This test is no t yet approved or cleared by the [place] FDA and  has been authorized for detection and/or diagnosis of SARS-CoV-2 by FDA under an Emergency Use Authorization (EUA). This EUA will remain  in effect (meaning this test can be used) for the duration of the COVID-19 declaration under Section 564(b)(1) of the Act, 21 U.S.C. section 360bbb-3(b)(1), unless the authorization is terminated or revoked sooner.     Influenza A by PCR NEGATIVE NEGATIVE Final   Influenza B by PCR NEGATIVE NEGATIVE Final    Comment: (NOTE) The Xpert Xpress SARS-CoV-2/FLU/RSV assay is intended as an aid in  the diagnosis of influenza from Nasopharyngeal swab specimens and  should not be used as a sole basis for treatment. Nasal washings and  aspirates are unacceptable for Xpert Xpress SARS-CoV-2/FLU/RSV  testing.  Fact Sheet for Patients: PinkCheek.be  Fact Sheet for Healthcare Providers: GravelBags.it  This test is not yet approved or cleared by the [place] FDA and  has been authorized for detection and/or diagnosis of SARS-CoV-2 by  FDA under an Emergency Use Authorization (EUA). This EUA will remain  in effect (meaning this test can be used) for the duration of the  Covid-19 declaration under Section 564(b)(1) of the Act, 21  U.S.C. section 360bbb-3(b)(1), unless the authorization is  terminated or revoked. Performed at [facility], [address] [place], [place] [address]       Scheduled Meds: . allopurinol  100 mg Oral Daily  . aspirin EC  81 mg Oral Daily  . cholestyramine  1 packet Oral TID WC  . cyanocobalamin  1,000 mcg Intramuscular Daily  . docusate sodium  100 mg Oral BID  . enoxaparin (LOVENOX) injection  40 mg Subcutaneous Q24H  . ferrous sulfate  325 mg Oral Daily  . fluticasone  2 spray Each Nare Daily  . furosemide  80 mg  Oral BID  . hydrocortisone   Rectal BID  . insulin aspart  0-15 Units Subcutaneous TID WC  . insulin aspart  0-5 Units Subcutaneous QHS  . insulin glargine  8 Units Subcutaneous QHS  . lactulose  10 g Oral Daily  . loratadine  10 mg Oral Daily  . metoprolol tartrate  12.5 mg Oral BID  . mirtazapine  15 mg Oral QHS  . nystatin   Topical BID  . potassium chloride  10 mEq Oral Daily  . QUEtiapine  12.5 mg Oral QHS  . senna-docusate  1 tablet Oral Daily  . sodium chloride flush  3 mL Intravenous Q12H   Continuous Infusions: . sodium chloride    . cefTRIAXone (ROCEPHIN)  IV Stopped (01/05/20 1219)    Assessment/Plan:  1. Right lower extremity cellulitis.  Continue Rocephin. 2. Erythema on buttocks and under skin folds.  Continue fungal cream and Desitin cream.  Appreciate wound care consultation. 3. Chronic diastolic congestive heart failure.  Continue with oral Lasix.  TED hose ordered.  Metoprolol dose decreased secondary to bradycardia.  Watch creatinine closely. 4. 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  Continue to watch creatinine closely.  Continue sliding scale and add low-dose Lantus. 5. Obesity with a BMI of 35.51.  Daily weights. 6. Appreciate psychiatric consultation since transitional care team was concerned about cognition.  Vitamin B12 on the lower side so I did give replacement for that.  RPR pending.  TSH and calcium in normal range. 7. 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  Respiratory status stable 8. History of gout.  Continue allopurinol     Code Status:     Code Status Orders  (From admission, onward)         Start     Ordered   01/04/20 1000  Full code  Continuous        01/04/20 1001        Code Status History    Date Active Date Inactive Code Status Order ID Comments User Context   01/04/2020 0600 01/04/2020 1001 Full Code [id number]  Athena Masse, MD ED   12/08/2019 1404 12/09/2019 2112 Full Code [id number]   Collier Bullock, MD ED   11/19/2019 2204 11/24/2019 1800 Full Code [id number]  Mansy, Arvella Merles, MD ED   10/22/2019 2231 10/31/2019 0440 Full Code [id number]  Elwyn Reach, MD ED   10/10/2019 0723 10/10/2019 1917 Full Code [id number]  Ivor Costa, MD ED   10/03/2019 0919 10/04/2019 2033 Full Code [id number]  Collier Bullock, MD Inpatient   09/07/2019 0733 09/13/2019 0002 DNR [id number]  Mansy, Arvella Merles, MD ED   09/07/2019 0256 09/07/2019 0733 DNR [id number]  Mansy, Arvella Merles, MD ED   08/01/2019 1102 08/03/2019 2223 DNR [id number]  Jimmy Footman, NP ED   07/11/2019 1242 07/13/2019 2316 DNR [id number]  Asencion Gowda, NP Inpatient   07/09/2019 2123 07/11/2019 1242 Partial Code [id number]  Neena Rhymes, MD ED   07/09/2019 2014 07/09/2019 2122 Full Code [id number]  Norins, Heinz Knuckles, MD ED   Advance Care Planning Activity     Disposition Plan: Status is: Inpatient  Dispo: The patient is from: [facility]              Anticipated d/c is to: Rehab              Anticipated d/c date is: Potential 01/08/2020              Patient currently being treated with IV antibiotics for right lower extremity cellulitis  Antibiotics:  IV Rocephin  Time spent: 27 minutes  [place]

## 2020-01-06 NOTE — Progress Notes (Signed)
Patient refused blood sugar check

## 2020-01-07 DIAGNOSIS — R531 Weakness: Secondary | ICD-10-CM

## 2020-01-07 DIAGNOSIS — I5032 Chronic diastolic (congestive) heart failure: Secondary | ICD-10-CM | POA: Diagnosis not present

## 2020-01-07 DIAGNOSIS — B369 Superficial mycosis, unspecified: Secondary | ICD-10-CM | POA: Diagnosis not present

## 2020-01-07 DIAGNOSIS — L03115 Cellulitis of right lower limb: Secondary | ICD-10-CM | POA: Diagnosis not present

## 2020-01-07 DIAGNOSIS — E1122 Type 2 diabetes mellitus with diabetic chronic kidney disease: Secondary | ICD-10-CM | POA: Diagnosis not present

## 2020-01-07 LAB — BASIC METABOLIC PANEL
Anion gap: 9 (ref 5–15)
BUN: 30 mg/dL — ABNORMAL HIGH (ref 8–23)
CO2: 30 mmol/L (ref 22–32)
Calcium: 8.1 mg/dL — ABNORMAL LOW (ref 8.9–10.3)
Chloride: 101 mmol/L (ref 98–111)
Creatinine, Ser: 1.33 mg/dL — ABNORMAL HIGH (ref 0.44–1.00)
GFR, Estimated: 38 mL/min — ABNORMAL LOW (ref >60)
Glucose, Bld: 220 mg/dL — ABNORMAL HIGH (ref 70–99)
Potassium: 4.3 mmol/L (ref 3.5–5.1)
Sodium: 140 mmol/L (ref 135–145)

## 2020-01-07 LAB — GLUCOSE, CAPILLARY
Glucose-Capillary: 144 mg/dL — ABNORMAL HIGH (ref 70–99)
Glucose-Capillary: 188 mg/dL — ABNORMAL HIGH (ref 70–99)
Glucose-Capillary: 116 mg/dL — ABNORMAL HIGH (ref 70–99)

## 2020-01-07 LAB — RPR: RPR Ser Ql: NONREACTIVE

## 2020-01-07 MED ORDER — FUROSEMIDE 40 MG PO TABS
40.0000 mg | ORAL_TABLET | Freq: Every day | ORAL | Status: DC
  Administered 2020-01-07: 40 mg via ORAL
  Filled 2020-01-07 (×2): qty 1

## 2020-01-07 MED ORDER — VITAMIN B-12 1000 MCG PO TABS
1000.0000 ug | ORAL_TABLET | Freq: Every day | ORAL | Status: DC
  Administered 2020-01-07 – 2020-01-14 (×6): 1000 ug via ORAL
  Filled 2020-01-07 (×7): qty 1

## 2020-01-07 MED ORDER — FUROSEMIDE 40 MG PO TABS
80.0000 mg | ORAL_TABLET | Freq: Every morning | ORAL | Status: DC
  Administered 2020-01-07 – 2020-01-08 (×2): 80 mg via ORAL
  Filled 2020-01-07: qty 2

## 2020-01-07 MED ORDER — VITAMIN B-12 1000 MCG PO TABS: 1000.0000 ug | ORAL_TABLET | Freq: Every day | ORAL | Status: DC

## 2020-01-07 MED ORDER — FUROSEMIDE 40 MG PO TABS: 40.0000 mg | ORAL_TABLET | Freq: Every day | ORAL | Status: DC

## 2020-01-07 MED ORDER — COLCHICINE 0.6 MG PO TABS
0.6000 mg | ORAL_TABLET | Freq: Every day | ORAL | Status: DC
  Administered 2020-01-07 – 2020-01-14 (×8): 0.6 mg via ORAL
  Filled 2020-01-07 (×8): qty 1

## 2020-01-07 MED ORDER — FUROSEMIDE 40 MG PO TABS: 80.0000 mg | ORAL_TABLET | Freq: Every morning | ORAL | Status: DC

## 2020-01-07 NOTE — Progress Notes (Signed)
PT Cancellation Note  Patient Details Name: Karla Sanchez MRN: [id number] DOB: 01-Aug-1940   Cancelled Treatment:    Reason Eval/Treat Not Completed: Patient declined, no reason specified. Patient reports that she is too sleepy.   Arelia Sneddon S,PT DPT 01/07/2020, 3:03 PM

## 2020-01-07 NOTE — Progress Notes (Signed)
Patient ID: Karla Sanchez, female   DOB: Nov 08, 1940, 79 y.o.   MRN: [id number] [facility] PROGRESS NOTE  Karla Sanchez [id number]:[id number] DOB: 1940/07/08 DOA: 01/04/2020 PCP: Alfredia Client, MD  HPI/Subjective: Patient very particular about her medications.  She states that she no longer takes some of these medications.  Did not want IV antibiotics yesterday so changed over to oral.  She is okay with the oral antibiotic.  Came in for cellulitis of the right lower extremity.  Objective: Vitals:   01/06/20 1525 01/07/20 0801  BP: (!) 149/67 (!) 165/65  Pulse: 60 75  Resp: 17 17  Temp: 97.6 F (36.4 C)   SpO2: 93% 94%    Intake/Output Summary (Last 24 hours) at 01/07/2020 1147 Last data filed at 01/07/2020 1012 Gross per 24 hour  Intake 480 ml  Output --  Net 480 ml   Filed Weights   01/03/20 1956  Weight: 99.8 kg    ROS: Review of Systems  Respiratory: Negative for shortness of breath.   Cardiovascular: Negative for chest pain.  Gastrointestinal: Negative for abdominal pain, nausea and vomiting.   Exam: Physical Exam HENT:     Head: Normocephalic.     Mouth/Throat:     Pharynx: No oropharyngeal exudate.  Eyes:     General: Lids are normal.     Pupils: Pupils are equal, round, and reactive to light.  Cardiovascular:     Rate and Rhythm: Normal rate and regular rhythm.     Heart sounds: Normal heart sounds, S1 normal and S2 normal.  Pulmonary:     Breath sounds: No decreased breath sounds, wheezing or rhonchi.  Abdominal:     Palpations: Abdomen is soft.     Tenderness: There is no abdominal tenderness.  Musculoskeletal:     Right lower leg: Swelling present.     Left lower leg: Swelling present.  Skin:    General: Skin is warm.     Comments: Right lower extremity cellulitis improving. Buttock erythema still present but better than the first day that I saw her.  Neurological:     Mental Status: She is alert.     Comments: Answers questions  appropriately.       Data Reviewed: Basic Metabolic Panel: Recent Labs  Lab 01/03/20 1959 01/05/20 1352 01/06/20 0748 01/07/20 0912  NA 143 139 138 140  K 4.2 4.6 4.5 4.3  CL 102 101 100 101  CO2 27 29 28 30   GLUCOSE 201* 165* 159* 220*  BUN 38* 35* 34* 30*  CREATININE 1.08* 1.46* 1.32* 1.33*  CALCIUM 8.4* 8.2* 8.2* 8.1*   CBC: Recent Labs  Lab 01/03/20 1959 01/05/20 1352 01/06/20 0748  WBC 12.8* 10.9* 10.2  NEUTROABS  --   --  6.8  HGB 11.5* 11.0* 10.4*  HCT 38.1 37.0 35.5*  MCV 77.4* 79.4* 79.4*  PLT 315 279 268   BNP (last 3 results) Recent Labs    11/19/19 2013 11/25/19 1738 12/08/19 1448  BNP 76.0 81.8 57.9    CBG: Recent Labs  Lab 01/06/20 0739 01/06/20 1128 01/06/20 1654 01/07/20 0758 01/07/20 1130  GLUCAP 160* 144* 176* 144* 188*    Recent Results (from the past 240 hour(s))  Respiratory Panel by RT PCR (Flu A&B, Covid) - Nasopharyngeal Swab     Status: None   Collection Time: 01/05/20  4:17 AM   Specimen: Nasopharyngeal Swab  Result Value Ref Range Status   SARS Coronavirus 2 by RT PCR NEGATIVE NEGATIVE Final  Comment: (NOTE) SARS-CoV-2 target nucleic acids are NOT DETECTED.  The SARS-CoV-2 RNA is generally detectable in upper respiratoy specimens during the acute phase of infection. The lowest concentration of SARS-CoV-2 viral copies this assay can detect is 131 copies/mL. A negative result does not preclude SARS-Cov-2 infection and should not be used as the sole basis for treatment or other patient management decisions. A negative result may occur with  improper specimen collection/handling, submission of specimen other than nasopharyngeal swab, presence of viral mutation(s) within the areas targeted by this assay, and inadequate number of viral copies (<131 copies/mL). A negative result must be combined with clinical observations, patient history, and epidemiological information. The expected result is Negative.  Fact Sheet  for Patients:  PinkCheek.be  Fact Sheet for Healthcare Providers:  GravelBags.it  This test is no t yet approved or cleared by the [place] FDA and  has been authorized for detection and/or diagnosis of SARS-CoV-2 by FDA under an Emergency Use Authorization (EUA). This EUA will remain  in effect (meaning this test can be used) for the duration of the COVID-19 declaration under Section 564(b)(1) of the Act, 21 U.S.C. section 360bbb-3(b)(1), unless the authorization is terminated or revoked sooner.     Influenza A by PCR NEGATIVE NEGATIVE Final   Influenza B by PCR NEGATIVE NEGATIVE Final    Comment: (NOTE) The Xpert Xpress SARS-CoV-2/FLU/RSV assay is intended as an aid in  the diagnosis of influenza from Nasopharyngeal swab specimens and  should not be used as a sole basis for treatment. Nasal washings and  aspirates are unacceptable for Xpert Xpress SARS-CoV-2/FLU/RSV  testing.  Fact Sheet for Patients: PinkCheek.be  Fact Sheet for Healthcare Providers: GravelBags.it  This test is not yet approved or cleared by the [place] FDA and  has been authorized for detection and/or diagnosis of SARS-CoV-2 by  FDA under an Emergency Use Authorization (EUA). This EUA will remain  in effect (meaning this test can be used) for the duration of the  Covid-19 declaration under Section 564(b)(1) of the Act, 21  U.S.C. section 360bbb-3(b)(1), unless the authorization is  terminated or revoked. Performed at [facility], [address], [place], [place] [address]       Scheduled Meds: . aspirin EC  81 mg Oral Daily  . cephALEXin  500 mg Oral Q8H  . colchicine  0.6 mg Oral Daily  . docusate sodium  100 mg Oral BID  . enoxaparin (LOVENOX) injection  40 mg Subcutaneous Q24H  . ferrous sulfate  325 mg Oral Daily  . fluticasone  2 spray Each Nare Daily  .  furosemide  80 mg Oral q AM   And  . furosemide  40 mg Oral Q1500  . hydrocortisone   Rectal BID  . insulin aspart  0-15 Units Subcutaneous TID WC  . insulin aspart  0-5 Units Subcutaneous QHS  . insulin glargine  8 Units Subcutaneous QHS  . lactulose  10 g Oral Daily  . loratadine  10 mg Oral Daily  . metoprolol tartrate  12.5 mg Oral BID  . mirtazapine  15 mg Oral QHS  . nystatin   Topical BID  . potassium chloride  10 mEq Oral Daily  . QUEtiapine  12.5 mg Oral QHS  . senna-docusate  1 tablet Oral Daily  . sodium chloride flush  3 mL Intravenous Q12H  . vitamin B-12  1,000 mcg Oral Daily   Continuous Infusions: . sodium chloride      Assessment/Plan:  1.  Right lower extremity cellulitis.  Patient did not want to do IV Rocephin and I switched over to p.o. Keflex. 2. Fungal infection buttocks and under skin folds with erythema.  Continue fungal cream and Desitin cream.  Appreciate wound care consultation 3. Chronic diastolic congestive heart failure continue Lasix twice daily but earlier in the afternoon.  Metoprolol dose decreased secondary to bradycardia the other day.  Creatinine has gone up slightly since coming in but stable from yesterday. 4. 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  Continue to watch creatinine closely.  Creatinine went up from admission but stabilized from yesterday.  Continue low-dose Lantus insulin. 5. Obesity with a BMI of 35.51. 6. Short-term memory loss.  Patient refused IM B12 but is okay with oral B12.  RPR pending.  TSH and calcium in normal range. 7. COPD with chronic respiratory failure.  Continue oxygen.  Respiratory status stable. 8. History of gout.  Patient states that she does not take allopurinol and only takes colchicine. 9. Weakness.  Patient states that she is interested in rehab I told her she must participate with physical therapy then.     Code Status:     Code Status Orders  (From admission, onward)          Start     Ordered   01/04/20 1000  Full code  Continuous        01/04/20 1001        Code Status History    Date Active Date Inactive Code Status Order ID Comments User Context   01/04/2020 0600 01/04/2020 1001 Full Code [id number]  Athena Masse, MD ED   12/08/2019 1404 12/09/2019 2112 Full Code [id number]  Collier Bullock, MD ED   11/19/2019 2204 11/24/2019 1800 Full Code [id number]  Mansy, Arvella Merles, MD ED   10/22/2019 2231 10/31/2019 0440 Full Code [id number]  Elwyn Reach, MD ED   10/10/2019 0723 10/10/2019 1917 Full Code [id number]  Ivor Costa, MD ED   10/03/2019 0919 10/04/2019 2033 Full Code [id number]  Collier Bullock, MD Inpatient   09/07/2019 0733 09/13/2019 0002 DNR [id number]  Mansy, Arvella Merles, MD ED   09/07/2019 0256 09/07/2019 0733 DNR [id number]  Mansy, Arvella Merles, MD ED   08/01/2019 1102 08/03/2019 2223 DNR [id number]  Jimmy Footman, NP ED   07/11/2019 1242 07/13/2019 2316 DNR [id number]  Asencion Gowda, NP Inpatient   07/09/2019 2123 07/11/2019 1242 Partial Code [id number]  Neena Rhymes, MD ED   07/09/2019 2014 07/09/2019 2122 Full Code [id number]  Norins, Heinz Knuckles, MD ED   Advance Care Planning Activity     Disposition Plan: Status is: Inpatient  Dispo: The patient is from: [facility]              Anticipated d/c is to: Rehab              Anticipated d/c date is: Potential 01/08/2020 if rehab bed available.  Patient states that she wants to go to rehab in [place].              Patient currently can be discharged out to rehab when bed available  Antibiotics:  Keflex  Time spent: 28 minutes  [place]

## 2020-01-08 DIAGNOSIS — R413 Other amnesia: Secondary | ICD-10-CM

## 2020-01-08 DIAGNOSIS — I5032 Chronic diastolic (congestive) heart failure: Secondary | ICD-10-CM | POA: Diagnosis not present

## 2020-01-08 DIAGNOSIS — B369 Superficial mycosis, unspecified: Secondary | ICD-10-CM | POA: Diagnosis not present

## 2020-01-08 DIAGNOSIS — L03115 Cellulitis of right lower limb: Secondary | ICD-10-CM | POA: Diagnosis not present

## 2020-01-08 DIAGNOSIS — E1122 Type 2 diabetes mellitus with diabetic chronic kidney disease: Secondary | ICD-10-CM | POA: Diagnosis not present

## 2020-01-08 LAB — GLUCOSE, CAPILLARY
Glucose-Capillary: 139 mg/dL — ABNORMAL HIGH (ref 70–99)
Glucose-Capillary: 256 mg/dL — ABNORMAL HIGH (ref 70–99)
Glucose-Capillary: 138 mg/dL — ABNORMAL HIGH (ref 70–99)

## 2020-01-08 LAB — BASIC METABOLIC PANEL
Anion gap: 9 (ref 5–15)
BUN: 27 mg/dL — ABNORMAL HIGH (ref 8–23)
CO2: 32 mmol/L (ref 22–32)
Calcium: 8.5 mg/dL — ABNORMAL LOW (ref 8.9–10.3)
Chloride: 98 mmol/L (ref 98–111)
Creatinine, Ser: 1.29 mg/dL — ABNORMAL HIGH (ref 0.44–1.00)
GFR, Estimated: 39 mL/min — ABNORMAL LOW (ref >60)
Glucose, Bld: 154 mg/dL — ABNORMAL HIGH (ref 70–99)
Potassium: 4.8 mmol/L (ref 3.5–5.1)
Sodium: 139 mmol/L (ref 135–145)

## 2020-01-08 MED ORDER — FUROSEMIDE 40 MG PO TABS
40.0000 mg | ORAL_TABLET | Freq: Every morning | ORAL | Status: DC
  Administered 2020-01-09 – 2020-01-14 (×3): 40 mg via ORAL
  Filled 2020-01-08 (×5): qty 1

## 2020-01-08 MED ORDER — FUROSEMIDE 40 MG PO TABS
40.0000 mg | ORAL_TABLET | Freq: Every day | ORAL | Status: DC
  Administered 2020-01-11 – 2020-01-14 (×2): 40 mg via ORAL
  Filled 2020-01-08 (×2): qty 1

## 2020-01-08 MED ORDER — ACETAMINOPHEN 650 MG RE SUPP: 650.0000 mg | Freq: Four times a day (QID) | RECTAL | Status: DC | PRN

## 2020-01-08 MED ORDER — ACETAMINOPHEN 325 MG PO TABS
650.0000 mg | ORAL_TABLET | Freq: Four times a day (QID) | ORAL | Status: DC | PRN
  Administered 2020-01-13: 650 mg via ORAL
  Filled 2020-01-08: qty 2

## 2020-01-08 NOTE — Progress Notes (Signed)
Pt refused vitals and blood sugar check.

## 2020-01-08 NOTE — Progress Notes (Signed)
Patient ID: Karla Sanchez, female   DOB: 10-Oct-1940, 79 y.o.   MRN: [id number] [facility] PROGRESS NOTE  Karla Sanchez EGB:[id number] DOB: May 09, 1940 DOA: 01/04/2020 PCP: Karla Client, MD  HPI/Subjective: Patient states that her short-term memory is not good.  She was able to get up into the chair to eat breakfast this morning.  She is interested in finding a place and [facility].  Objective: Vitals:   01/07/20 0801 01/07/20 1639  BP: (!) 165/65 (!) 173/80  Pulse: 75 82  Resp: 17 19  Temp:  97.9 F (36.6 C)  SpO2: 94% 97%    Intake/Output Summary (Last 24 hours) at 01/08/2020 1442 Last data filed at 01/08/2020 1000 Gross per 24 hour  Intake 720 ml  Output 325 ml  Net 395 ml   Filed Weights   01/03/20 1956  Weight: 99.8 kg    ROS: Review of Systems  Respiratory: Negative for shortness of breath.   Cardiovascular: Negative for chest pain.  Gastrointestinal: Negative for abdominal pain, nausea and vomiting.   Exam: Physical Exam HENT:     Head: Normocephalic.     Mouth/Throat:     Pharynx: No oropharyngeal exudate.  Eyes:     General: Lids are normal.     Conjunctiva/sclera: Conjunctivae normal.     Pupils: Pupils are equal, round, and reactive to light.  Cardiovascular:     Rate and Rhythm: Normal rate and regular rhythm.     Heart sounds: Normal heart sounds, S1 normal and S2 normal.  Pulmonary:     Breath sounds: No decreased breath sounds, wheezing, rhonchi or rales.  Abdominal:     Palpations: Abdomen is soft.     Tenderness: There is no abdominal tenderness.  Musculoskeletal:     Right ankle: Swelling present.     Left ankle: Swelling present.  Skin:    General: Skin is warm.     Findings: No rash.     Comments: Because she was sitting in the chair unable to look at her buttock today. Right lower extremity cellulitis has improved.  Neurological:     Mental Status: She is alert.     Comments: Answers questions  appropriately.       Data Reviewed: Basic Metabolic Panel: Recent Labs  Lab 01/03/20 1959 01/05/20 1352 01/06/20 0748 01/07/20 0912 01/08/20 0828  NA 143 139 138 140 139  K 4.2 4.6 4.5 4.3 4.8  CL 102 101 100 101 98  CO2 27 29 28 30  32  GLUCOSE 201* 165* 159* 220* 154*  BUN 38* 35* 34* 30* 27*  CREATININE 1.08* 1.46* 1.32* 1.33* 1.29*  CALCIUM 8.4* 8.2* 8.2* 8.1* 8.5*   CBC: Recent Labs  Lab 01/03/20 1959 01/05/20 1352 01/06/20 0748  WBC 12.8* 10.9* 10.2  NEUTROABS  --   --  6.8  HGB 11.5* 11.0* 10.4*  HCT 38.1 37.0 35.5*  MCV 77.4* 79.4* 79.4*  PLT 315 279 268   BNP (last 3 results) Recent Labs    11/19/19 2013 11/25/19 1738 12/08/19 1448  BNP 76.0 81.8 57.9    CBG: Recent Labs  Lab 01/07/20 0758 01/07/20 1130 01/07/20 1640 01/08/20 0833 01/08/20 1041  GLUCAP 144* 188* 116* 139* 256*    Recent Results (from the past 240 hour(s))  Respiratory Panel by RT PCR (Flu A&B, Covid) - Nasopharyngeal Swab     Status: None   Collection Time: 01/05/20  4:17 AM   Specimen: Nasopharyngeal Swab  Result Value Ref Range Status  SARS Coronavirus 2 by RT PCR NEGATIVE NEGATIVE Final    Comment: (NOTE) SARS-CoV-2 target nucleic acids are NOT DETECTED.  The SARS-CoV-2 RNA is generally detectable in upper respiratoy specimens during the acute phase of infection. The lowest concentration of SARS-CoV-2 viral copies this assay can detect is 131 copies/mL. A negative result does not preclude SARS-Cov-2 infection and should not be used as the sole basis for treatment or other patient management decisions. A negative result may occur with  improper specimen collection/handling, submission of specimen other than nasopharyngeal swab, presence of viral mutation(s) within the areas targeted by this assay, and inadequate number of viral copies (<131 copies/mL). A negative result must be combined with clinical observations, patient history, and epidemiological information.  The expected result is Negative.  Fact Sheet for Patients:  PinkCheek.be  Fact Sheet for Healthcare Providers:  GravelBags.it  This test is no t yet approved or cleared by the [place] FDA and  has been authorized for detection and/or diagnosis of SARS-CoV-2 by FDA under an Emergency Use Authorization (EUA). This EUA will remain  in effect (meaning this test can be used) for the duration of the COVID-19 declaration under Section 564(b)(1) of the Act, 21 U.S.C. section 360bbb-3(b)(1), unless the authorization is terminated or revoked sooner.     Influenza A by PCR NEGATIVE NEGATIVE Final   Influenza B by PCR NEGATIVE NEGATIVE Final    Comment: (NOTE) The Xpert Xpress SARS-CoV-2/FLU/RSV assay is intended as an aid in  the diagnosis of influenza from Nasopharyngeal swab specimens and  should not be used as a sole basis for treatment. Nasal washings and  aspirates are unacceptable for Xpert Xpress SARS-CoV-2/FLU/RSV  testing.  Fact Sheet for Patients: PinkCheek.be  Fact Sheet for Healthcare Providers: GravelBags.it  This test is not yet approved or cleared by the [place] FDA and  has been authorized for detection and/or diagnosis of SARS-CoV-2 by  FDA under an Emergency Use Authorization (EUA). This EUA will remain  in effect (meaning this test can be used) for the duration of the  Covid-19 declaration under Section 564(b)(1) of the Act, 21  U.S.C. section 360bbb-3(b)(1), unless the authorization is  terminated or revoked. Performed at [facility], [address] [place], [place] [address]      Scheduled Meds: . aspirin EC  81 mg Oral Daily  . cephALEXin  500 mg Oral Q8H  . colchicine  0.6 mg Oral Daily  . docusate sodium  100 mg Oral BID  . enoxaparin (LOVENOX) injection  40 mg Subcutaneous Q24H  . ferrous sulfate  325 mg Oral Daily   . fluticasone  2 spray Each Nare Daily  . [START ON 01/09/2020] furosemide  40 mg Oral q AM   And  . [START ON 01/09/2020] furosemide  40 mg Oral Q1500  . hydrocortisone   Rectal BID  . insulin aspart  0-15 Units Subcutaneous TID WC  . insulin aspart  0-5 Units Subcutaneous QHS  . insulin glargine  8 Units Subcutaneous QHS  . lactulose  10 g Oral Daily  . loratadine  10 mg Oral Daily  . metoprolol tartrate  12.5 mg Oral BID  . mirtazapine  15 mg Oral QHS  . nystatin   Topical BID  . potassium chloride  10 mEq Oral Daily  . QUEtiapine  12.5 mg Oral QHS  . senna-docusate  1 tablet Oral Daily  . sodium chloride flush  3 mL Intravenous Q12H  . vitamin B-12  1,000 mcg  Oral Daily   Continuous Infusions: . sodium chloride      Assessment/Plan:  1. Right lower extremity cellulitis.  Continue p.o. Keflex. 2. Fungal infection buttock and on the skin folds with erythema.  Continue Desitin cream and anti-fungal nystatin powder. 3. Chronic diastolic congestive heart failure.  Decrease Lasix to 40 mg twice daily.  Metoprolol dose decreased secondary to bradycardia previously. 4. 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  Creatinine down to 1.29. 5. Obesity with a BMI of 35.51 6. Short-term memory loss.  B12 level on the lower side.  Received 2 doses of IM B12 and now on oral B12.  RPR nonreactive, TSH and calcium in normal range. 7. COPD with chronic respiratory failure.  Continue oxygen supplementation. 8. History of gout on colchicine daily 9. Weakness.  Patient participated with physical therapy today.  Transitional care team looking into options.      Code Status:     Code Status Orders  (From admission, onward)         Start     Ordered   01/04/20 1000  Full code  Continuous        01/04/20 1001        Code Status History    Date Active Date Inactive Code Status Order ID Comments User Context   01/04/2020 0600 01/04/2020 1001 Full Code [id number]   Athena Masse, MD ED   12/08/2019 1404 12/09/2019 2112 Full Code [id number]  Collier Bullock, MD ED   11/19/2019 2204 11/24/2019 1800 Full Code [id number]  Mansy, Arvella Merles, MD ED   10/22/2019 2231 10/31/2019 0440 Full Code [id number]  Elwyn Reach, MD ED   10/10/2019 0723 10/10/2019 1917 Full Code [id number]  Ivor Costa, MD ED   10/03/2019 0919 10/04/2019 2033 Full Code [id number]  Collier Bullock, MD Inpatient   09/07/2019 0733 09/13/2019 0002 DNR [id number]  Mansy, Arvella Merles, MD ED   09/07/2019 0256 09/07/2019 0733 DNR [id number]  Mansy, Arvella Merles, MD ED   08/01/2019 1102 08/03/2019 2223 DNR [id number]  Jimmy Footman, NP ED   07/11/2019 1242 07/13/2019 2316 DNR [id number]  Asencion Gowda, NP Inpatient   07/09/2019 2123 07/11/2019 1242 Partial Code [id number]  Neena Rhymes, MD ED   07/09/2019 2014 07/09/2019 2122 Full Code [id number]  Norins, Heinz Knuckles, MD ED   Advance Care Planning Activity     Disposition Plan: Status is: Inpatient  Dispo: The patient is from: [facility]              Anticipated d/c is to: Initially thought patient would be a candidate for rehab but may be a candidate for assisted living.              Anticipated d/c date is: Transitional care team looking into options for this patient              Patient currently medically stable and can be discharged once we have a safe disposition.  Time spent: 27 minutes, case discussed with transitional care team.  Loletha Grayer  [facility]

## 2020-01-08 NOTE — Care Management Important Message (Signed)
Important Message  Patient Details  Name: Karla Sanchez MRN: [id number] Date of Birth: April 10, 1940   Medicare Important Message Given:  Yes     Dannette Barbara 01/08/2020, 11:53 AM

## 2020-01-08 NOTE — Evaluation (Signed)
Physical Therapy Evaluation Patient Details Name: Karla Sanchez MRN: [id number] DOB: [birth year] Today's Date: 01/08/2020   History of Present Illness  Karla Sanchez is a 79 y.o. 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 MD assessment includes R LE cellulitis and physical deconditioning  Clinical Impression  Pt readily agreed to participate in therapy. Pt was able to come to sitting EOB with Mod IND for use of rails and increased time. Pt able to perform STS to RW Mod IND. PT able to ambulate from bed to chair with SBA. At baseline, pt is primarily WC bound and 3-5 is usually pt's maximum walking distance. Pt was able to STS but had some incontinence and had to sit back down. Pt attempted ambulation again and pt was able to ambulate but was still incontient throughout ambulation. Pt was able to come back to supine with Mod IND. Pt was extensively educated on the importance of getting a reevaluation for power WC as pt reported that she needed a new one but was unable to get it due to not going to her appointment. Pt is at baseline functioning but could benefit from HHPT to assist with establishing HEP to maintain functional mobility. Pt is currently living out of her [name] and is unsure of where she is discharging to after hospital stay. Pt will benefit from HHPT services upon discharge to safely address deficits listed in patient problem list for decreased caregiver assistance and eventual return to PLOF.     Follow Up Recommendations Home health PT    Equipment Recommendations  None recommended by PT    Recommendations for Other Services       Precautions / Restrictions Restrictions Weight Bearing Restrictions: No       Mobility  Bed Mobility Overal bed mobility: Modified Independent             General bed mobility comments: use of bed rails and increased time  Transfers Overall transfer level: Modified independent               General transfer comment: pt able to come to standing without PT assist. Requires some increased effort  Ambulation/Gait Ambulation/Gait assistance: Supervision Gait Distance (Feet): 3 Feet x2 Assistive device: Rolling walker (2 wheeled)       General Gait Details: pt reports that the bones in her feet are very weak and that WC is primary mode of transport at baseline. Walks 3 about 3-4 feet at max at baseline  Stairs            Wheelchair Mobility    Modified Rankin (Stroke Patients Only)       Balance Overall balance assessment: Needs assistance Sitting-balance support: Feet supported;No upper extremity supported Sitting balance-Leahy Scale: Good Sitting balance - Comments: pt able to maintain quiet sitting and shift weight outside base of support       Standing balance comment: pt able to maintain quiet standing balance                             Pertinent Vitals/Pain Pain Assessment: 0-10 Pain Score: 4  Pain Location: R LE Pain Descriptors / Indicators: Aching Pain Intervention(s): Limited activity within patient's tolerance;Monitored during session;Repositioned    Home Living Family/patient expects  to be discharged to:: Unsure Living Arrangements: Alone               Additional Comments: [place] [facility] [place] [facility] [place]. pt wants to return to living in [place] but is unsure of where she is going    Prior Function Level of Independence: Independent with assistive device(s)         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 and can only take 2  steps for transfers; does not walk d/t this and does not stand for any length of time d/t this--pt reports it will "crush" the bones in her feet)     Hand Dominance   Dominant Hand: Right    Extremity/Trunk Assessment                Communication   Communication: No difficulties  Cognition Arousal/Alertness: Awake/alert Behavior During Therapy: WFL for tasks assessed/performed Overall Cognitive Status: Within Functional Limits for tasks assessed                                 General Comments: pt does not always respond appropriately to questions and often needs redirecting to stay on task      General Comments General comments (skin integrity, edema, etc.): pt is incontinent upon standing on multiple occasions    Exercises Other Exercises Other Exercises: pt educated on POC and importance of getting a WC reevaluation Other Exercises: pt performs multiple STS to allow for linen and gown changing   Assessment/Plan    PT Assessment Patient needs continued PT services  PT Problem List Decreased mobility;Cardiopulmonary status limiting activity;Decreased balance       PT Treatment Interventions      PT Goals (Current goals can be found in the Care Plan section)  Acute Rehab PT Goals Patient Stated Goal: to be in less pain Time For Goal Achievement: 01/21/20 Potential to Achieve Goals: Good    Frequency     Barriers to discharge        Co-evaluation               AM-PAC PT "6 Clicks" Mobility  Outcome Measure Help needed turning from your back to your side while in a flat bed without using bedrails?: None Help needed moving from lying on your back to sitting on the side of a flat bed without using bedrails?: None Help needed moving to and from a bed to a chair (including a wheelchair)?: A Little Help needed standing up from a chair using your arms (e.g., wheelchair or bedside chair)?: A Little Help needed to walk in hospital room?: A  Little Help needed climbing 3-5 steps with a railing? : A Lot 6 Click Score: 19    End of Session Equipment Utilized During Treatment: Oxygen;Gait belt Activity Tolerance: Patient tolerated treatment well Patient left: in bed;with call bell/phone within reach (bed alarm not on like pt was found) Nurse Communication: Mobility status PT Visit Diagnosis: Muscle weakness (generalized) (M62.81)    Time: 5462-7035 PT Time Calculation (min) (ACUTE ONLY): 54 min   Charges:              Hervey Ard, SPT 01/08/20. 12:46 PM

## 2020-01-08 NOTE — TOC Progression Note (Signed)
Transition of Care [facility]) - Progression Note    Patient Details  Name: Karla Sanchez MRN: [id number] Date of Birth: January 27, 1941  [facility]) CM/SW [place], RN Phone Number: 01/08/2020, 3:39 PM  Clinical Narrative:   RNCM attempted to meet with patient at bedside to further discuss discharge planning but patient sleeping soundly and did not easily arouse to verbal stimuli. RNCM will try back at a later time.     Expected Discharge Plan: Skilled Nursing Facility Barriers to Discharge: Continued Medical Work up  Expected Discharge Plan and Services Expected Discharge Plan: [place] In-house Referral: Clinical Social Work   Post Acute Care Choice: Durable Medical Equipment Living arrangements for the past 2 months: Hotel/Motel                                       Social Determinants of Health (SDOH) Interventions    Readmission Risk Interventions Readmission Risk Prevention Plan 10/24/2019  Medication Review (RN Care Manager) Complete  HRI or [place] Patient Refused

## 2020-01-09 DIAGNOSIS — B369 Superficial mycosis, unspecified: Secondary | ICD-10-CM | POA: Diagnosis not present

## 2020-01-09 DIAGNOSIS — E1122 Type 2 diabetes mellitus with diabetic chronic kidney disease: Secondary | ICD-10-CM | POA: Diagnosis not present

## 2020-01-09 DIAGNOSIS — E538 Deficiency of other specified B group vitamins: Secondary | ICD-10-CM

## 2020-01-09 DIAGNOSIS — I5032 Chronic diastolic (congestive) heart failure: Secondary | ICD-10-CM | POA: Diagnosis not present

## 2020-01-09 DIAGNOSIS — L03115 Cellulitis of right lower limb: Secondary | ICD-10-CM | POA: Diagnosis not present

## 2020-01-09 LAB — GLUCOSE, CAPILLARY
Glucose-Capillary: 131 mg/dL — ABNORMAL HIGH (ref 70–99)
Glucose-Capillary: 356 mg/dL — ABNORMAL HIGH (ref 70–99)
Glucose-Capillary: 227 mg/dL — ABNORMAL HIGH (ref 70–99)
Glucose-Capillary: 276 mg/dL — ABNORMAL HIGH (ref 70–99)

## 2020-01-09 MED ORDER — POTASSIUM CHLORIDE CRYS ER 10 MEQ PO TBCR: 10.0000 meq | EXTENDED_RELEASE_TABLET | Freq: Every day | ORAL | 0 refills | Status: DC

## 2020-01-09 MED ORDER — LACTULOSE 10 GM/15ML PO SOLN
10.0000 g | Freq: Every day | ORAL | 0 refills | Status: DC
Start: 2020-01-09 — End: 2020-06-09

## 2020-01-09 MED ORDER — SENNOSIDES-DOCUSATE SODIUM 8.6-50 MG PO TABS
1.0000 | ORAL_TABLET | Freq: Every day | ORAL | 0 refills | Status: DC
Start: 2020-01-09 — End: 2020-06-09

## 2020-01-09 MED ORDER — ZINC OXIDE 40 % EX OINT: 1.0000 "application " | TOPICAL_OINTMENT | Freq: Two times a day (BID) | CUTANEOUS | 0 refills | Status: DC

## 2020-01-09 MED ORDER — ALBUTEROL SULFATE HFA 108 (90 BASE) MCG/ACT IN AERS: 2.0000 | INHALATION_SPRAY | Freq: Four times a day (QID) | RESPIRATORY_TRACT | 0 refills | Status: AC | PRN

## 2020-01-09 MED ORDER — INSULIN LISPRO (1 UNIT DIAL) 100 UNIT/ML (KWIKPEN): 3.0000 [IU] | PEN_INJECTOR | Freq: Three times a day (TID) | SUBCUTANEOUS | 0 refills | Status: DC

## 2020-01-09 MED ORDER — TRAZODONE HCL 50 MG PO TABS: 50.0000 mg | ORAL_TABLET | Freq: Every evening | ORAL | 0 refills | Status: DC | PRN

## 2020-01-09 MED ORDER — CEPHALEXIN 500 MG PO CAPS: 500.0000 mg | ORAL_CAPSULE | Freq: Three times a day (TID) | ORAL | 0 refills | Status: AC

## 2020-01-09 MED ORDER — DOCUSATE SODIUM 100 MG PO CAPS
100.0000 mg | ORAL_CAPSULE | Freq: Every evening | ORAL | 0 refills | Status: DC
Start: 2020-01-09 — End: 2021-05-01

## 2020-01-09 MED ORDER — NYSTATIN 100000 UNIT/GM EX POWD: Freq: Two times a day (BID) | CUTANEOUS | 0 refills | Status: DC

## 2020-01-09 MED ORDER — MIRTAZAPINE 15 MG PO TABS
15.0000 mg | ORAL_TABLET | Freq: Every day | ORAL | 0 refills | Status: DC
Start: 2020-01-09 — End: 2020-06-13

## 2020-01-09 MED ORDER — HYDROCORTISONE (PERIANAL) 2.5 % EX CREA
TOPICAL_CREAM | Freq: Two times a day (BID) | CUTANEOUS | 0 refills | Status: DC
Start: 2020-01-09 — End: 2020-06-09

## 2020-01-09 MED ORDER — CYANOCOBALAMIN 1000 MCG PO TABS: 1000.0000 ug | ORAL_TABLET | Freq: Every day | ORAL | 0 refills | Status: DC

## 2020-01-09 MED ORDER — ASPIRIN EC 81 MG PO TBEC
81.0000 mg | DELAYED_RELEASE_TABLET | Freq: Every day | ORAL | 0 refills | Status: DC
Start: 2020-01-09 — End: 2021-09-04

## 2020-01-09 MED ORDER — FUROSEMIDE 40 MG PO TABS
40.0000 mg | ORAL_TABLET | Freq: Two times a day (BID) | ORAL | 0 refills | Status: DC
Start: 2020-01-09 — End: 2020-06-13

## 2020-01-09 MED ORDER — METOPROLOL TARTRATE 25 MG PO TABS: 12.5000 mg | ORAL_TABLET | Freq: Two times a day (BID) | ORAL | 0 refills | Status: DC

## 2020-01-09 MED ORDER — INSULIN GLARGINE 100 UNIT/ML SOLOSTAR PEN: 10.0000 [IU] | PEN_INJECTOR | Freq: Every day | SUBCUTANEOUS | 0 refills | Status: DC

## 2020-01-09 MED ORDER — IPRATROPIUM-ALBUTEROL 0.5-2.5 (3) MG/3ML IN SOLN: 3.0000 mL | Freq: Four times a day (QID) | RESPIRATORY_TRACT | 0 refills | Status: DC | PRN

## 2020-01-09 MED ORDER — INSULIN PEN NEEDLE 31G X 8 MM MISC: 1.0000 | Freq: Three times a day (TID) | 0 refills | Status: DC

## 2020-01-09 MED ORDER — FERROUS SULFATE 325 (65 FE) MG PO TABS
325.0000 mg | ORAL_TABLET | Freq: Every day | ORAL | 0 refills | Status: DC
Start: 2020-01-09 — End: 2021-05-01

## 2020-01-09 NOTE — TOC Progression Note (Addendum)
Transition of Care [facility]) - Progression Note    Patient Details  Name: Karla Sanchez MRN: [id number] Date of Birth: 09/23/1940  Transition of Care [facility]) CM/SW Contact  Shelbie Ammons, RN Phone Number: 01/09/2020, 9:43 AM  Clinical Narrative:     8:30am: Reached out and left VMs for Sadie Haber at [phone] and her supervisor Crystal at [phone] to discuss patient case.   RNCM met with patient in room, patient sitting up in recliner talking on phone. Patient reports that she understands that she is being discharged today but that she has reached out to the [facility] and they are booked up for the next week. Provided patient with list of extended stay motels in the area. Patient re-iterates that her [name] is here and she has ran out of gas. She reports that he has someone that is able to go get her gas but they can't come into the hospital. This RNCM took patient's keys to the front desk to be picked up by Sonia Panipinto.   12:15pm: RNCM met with patient in room, she reports that she doesn't know what she's going to do she doesn't have anywhere to go. She reports that she has called the [facility] but they are unable to offer anything right now. Provided patient with list of extended stay motels to reach out to.   2pm: Met with patient in room, she reports that she has reached out to the extended stay motels but that they are unable to assist her in a wheelchair. Discussed what options patient is considering and she reports she is unsure.   3:15pm: Met with patient again in room, discussed if she had come up with any solutions of what to do when she leaves here. Patient reports that she will just go sit in her [name] if she is discharged due to not having anywhere to go and she can't drive.  RNCM reached out to patient's nephew Annie Main to discuss current issues. Annie Main reports that he has tried to help his aunt in the past and has been unsuccessful, he reports that he is just got  married and now he doesn't have a home currently, he reports that he will not be able to assist.  RNCM provided patient with a list of ALF/FCH in the area to reach out to and determine if this might be an option for her.  After several calls throughout day received a return call from [facility] with DSS. She reports that she has tried numerous times to help patient and that since patient has capacity to make her own decisions and since she has repeatedly refused their help she along with her supervisory will more than likely decide to discharge patient from DSS involvement.     Expected Discharge Plan: [place] Barriers to Discharge: Continued Medical Work up  Expected Discharge Plan and Services Expected Discharge Plan: [place] In-house Referral: Clinical Social Work   Post Acute Care Choice: Durable Medical Equipment Living arrangements for the past 2 months: Hotel/Motel Expected Discharge Date: 01/09/20                                     Social Determinants of Health (SDOH) Interventions    Readmission Risk Interventions Readmission Risk Prevention Plan 10/24/2019  Medication Review (RN Care Manager) Complete  HRI or [place] Patient Refused

## 2020-01-09 NOTE — Discharge Instructions (Signed)

## 2020-01-09 NOTE — Progress Notes (Signed)
Patient ID: Karla Sanchez, female   DOB: 14-Apr-1940, 79 y.o.   MRN: [id number] [facility] PROGRESS NOTE  Karla Sanchez ALP:[id number] DOB: 06/16/1940 DOA: 01/04/2020 PCP: Alfredia Client, MD  HPI/Subjective: Patient seen this morning was sitting in a pool of urine. Yesterday the patient was able to get up to the commode. Patient states that she is cold. The room was very cold temperature wise and we increased the temperature. Her right leg cellulitis has improved.  Objective: Vitals:   01/09/20 0902 01/09/20 1550  BP: (!) 159/63 (!) 158/53  Pulse:  70  Resp:  18  Temp:  99.1 F (37.3 C)  SpO2:  97%    Intake/Output Summary (Last 24 hours) at 01/09/2020 1551 Last data filed at 01/09/2020 1429 Gross per 24 hour  Intake 600 ml  Output 0 ml  Net 600 ml   Filed Weights   01/03/20 1956  Weight: 99.8 kg    ROS: Review of Systems  Respiratory: Negative for shortness of breath.   Cardiovascular: Negative for chest pain.  Gastrointestinal: Negative for abdominal pain, nausea and vomiting.   Exam: Physical Exam HENT:     Head: Normocephalic.     Mouth/Throat:     Pharynx: No oropharyngeal exudate.  Eyes:     General: Lids are normal.     Conjunctiva/sclera: Conjunctivae normal.     Pupils: Pupils are equal, round, and reactive to light.  Cardiovascular:     Rate and Rhythm: Normal rate and regular rhythm.     Heart sounds: Normal heart sounds, S1 normal and S2 normal.  Pulmonary:     Breath sounds: No decreased breath sounds, wheezing, rhonchi or rales.  Abdominal:     Palpations: Abdomen is soft.     Tenderness: There is no abdominal tenderness.  Musculoskeletal:     Right lower leg: Swelling present.     Left lower leg: Swelling present.  Skin:    General: Skin is warm.     Comments: Right lower extremity erythema improved Bilateral buttock erythema still present but less angry than on presentation.  Neurological:     Mental Status: She is  alert.     Comments: Answers questions appropriately       Data Reviewed: Basic Metabolic Panel: Recent Labs  Lab 01/03/20 1959 01/05/20 1352 01/06/20 0748 01/07/20 0912 01/08/20 0828  NA 143 139 138 140 139  K 4.2 4.6 4.5 4.3 4.8  CL 102 101 100 101 98  CO2 27 29 28 30  32  GLUCOSE 201* 165* 159* 220* 154*  BUN 38* 35* 34* 30* 27*  CREATININE 1.08* 1.46* 1.32* 1.33* 1.29*  CALCIUM 8.4* 8.2* 8.2* 8.1* 8.5*   CBC: Recent Labs  Lab 01/03/20 1959 01/05/20 1352 01/06/20 0748  WBC 12.8* 10.9* 10.2  NEUTROABS  --   --  6.8  HGB 11.5* 11.0* 10.4*  HCT 38.1 37.0 35.5*  MCV 77.4* 79.4* 79.4*  PLT 315 279 268   BNP (last 3 results) Recent Labs    11/19/19 2013 11/25/19 1738 12/08/19 1448  BNP 76.0 81.8 57.9    CBG: Recent Labs  Lab 01/08/20 0833 01/08/20 1041 01/08/20 1727 01/09/20 0857 01/09/20 1146  GLUCAP 139* 256* 138* 131* 356*    Recent Results (from the past 240 hour(s))  Respiratory Panel by RT PCR (Flu A&B, Covid) - Nasopharyngeal Swab     Status: None   Collection Time: 01/05/20  4:17 AM   Specimen: Nasopharyngeal Swab  Result Value Ref Range Status  SARS Coronavirus 2 by RT PCR NEGATIVE NEGATIVE Final    Comment: (NOTE) SARS-CoV-2 target nucleic acids are NOT DETECTED.  The SARS-CoV-2 RNA is generally detectable in upper respiratoy specimens during the acute phase of infection. The lowest concentration of SARS-CoV-2 viral copies this assay can detect is 131 copies/mL. A negative result does not preclude SARS-Cov-2 infection and should not be used as the sole basis for treatment or other patient management decisions. A negative result may occur with  improper specimen collection/handling, submission of specimen other than nasopharyngeal swab, presence of viral mutation(s) within the areas targeted by this assay, and inadequate number of viral copies (<131 copies/mL). A negative result must be combined with clinical observations, patient  history, and epidemiological information. The expected result is Negative.  Fact Sheet for Patients:  PinkCheek.be  Fact Sheet for Healthcare Providers:  GravelBags.it  This test is no t yet approved or cleared by the [place] FDA and  has been authorized for detection and/or diagnosis of SARS-CoV-2 by FDA under an Emergency Use Authorization (EUA). This EUA will remain  in effect (meaning this test can be used) for the duration of the COVID-19 declaration under Section 564(b)(1) of the Act, 21 U.S.C. section 360bbb-3(b)(1), unless the authorization is terminated or revoked sooner.     Influenza A by PCR NEGATIVE NEGATIVE Final   Influenza B by PCR NEGATIVE NEGATIVE Final    Comment: (NOTE) The Xpert Xpress SARS-CoV-2/FLU/RSV assay is intended as an aid in  the diagnosis of influenza from Nasopharyngeal swab specimens and  should not be used as a sole basis for treatment. Nasal washings and  aspirates are unacceptable for Xpert Xpress SARS-CoV-2/FLU/RSV  testing.  Fact Sheet for Patients: PinkCheek.be  Fact Sheet for Healthcare Providers: GravelBags.it  This test is not yet approved or cleared by the [place] FDA and  has been authorized for detection and/or diagnosis of SARS-CoV-2 by  FDA under an Emergency Use Authorization (EUA). This EUA will remain  in effect (meaning this test can be used) for the duration of the  Covid-19 declaration under Section 564(b)(1) of the Act, 21  U.S.C. section 360bbb-3(b)(1), unless the authorization is  terminated or revoked. Performed at [facility], [address] [place], [place] [address]      Scheduled Meds: . aspirin EC  81 mg Oral Daily  . cephALEXin  500 mg Oral Q8H  . colchicine  0.6 mg Oral Daily  . docusate sodium  100 mg Oral BID  . enoxaparin (LOVENOX) injection  40 mg Subcutaneous  Q24H  . ferrous sulfate  325 mg Oral Daily  . fluticasone  2 spray Each Nare Daily  . furosemide  40 mg Oral q AM   And  . furosemide  40 mg Oral Q1500  . hydrocortisone   Rectal BID  . insulin aspart  0-15 Units Subcutaneous TID WC  . insulin aspart  0-5 Units Subcutaneous QHS  . insulin glargine  8 Units Subcutaneous QHS  . lactulose  10 g Oral Daily  . loratadine  10 mg Oral Daily  . metoprolol tartrate  12.5 mg Oral BID  . mirtazapine  15 mg Oral QHS  . nystatin   Topical BID  . potassium chloride  10 mEq Oral Daily  . QUEtiapine  12.5 mg Oral QHS  . senna-docusate  1 tablet Oral Daily  . sodium chloride flush  3 mL Intravenous Q12H  . vitamin B-12  1,000 mcg Oral Daily   Continuous Infusions: .  sodium chloride      Assessment/Plan:  1. Unsafe discharge plan today. Unable to send to homeless shelter secondary to oxygen. Hotel did not take her back. Transitional care team working on things. 2. Right lower extremity cellulitis. Finished up course of p.o. Keflex. 3. Fungal infection on buttock and skin folds with erythema. Continue Desitin cream and antifungal nystatin powder. Sitting in urine this morning does not help this. 4. Chronic diastolic congestive heart failure. Continue Lasix at lower dose 40 mg twice a day. Metoprolol dose decreased secondary to bradycardia previously. 5. 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 Continue Lantus insulin. Continue to monitor creatinine. 6. Obesity with a BMI of 35.51. 7. B12 deficiency patient received IM B12 for 2 days and now on oral B12. 8. Short-term memory loss. Replacing B12 secondary to deficiency. RPR nonreactive. TSH and calcium normal range. 9. COPD with chronic respiratory failure continue oxygen supplementation 10. Gout. Continue colchicine. 11. Weakness. Patient participated in physical therapy yesterday and is back to her baseline mobility and they recommended home with home health.    Code  Status:     Code Status Orders  (From admission, onward)         Start     Ordered   01/04/20 1000  Full code  Continuous        01/04/20 1001        Code Status History    Date Active Date Inactive Code Status Order ID Comments User Context   01/04/2020 0600 01/04/2020 1001 Full Code [id number]  Athena Masse, MD ED   12/08/2019 1404 12/09/2019 2112 Full Code [id number]  Collier Bullock, MD ED   11/19/2019 2204 11/24/2019 1800 Full Code [id number]  Mansy, Arvella Merles, MD ED   10/22/2019 2231 10/31/2019 0440 Full Code [id number]  Elwyn Reach, MD ED   10/10/2019 0723 10/10/2019 1917 Full Code [id number]  Ivor Costa, MD ED   10/03/2019 0919 10/04/2019 2033 Full Code [id number]  Collier Bullock, MD Inpatient   09/07/2019 0733 09/13/2019 0002 DNR [id number]  Mansy, Arvella Merles, MD ED   09/07/2019 0256 09/07/2019 0733 DNR [id number]  Mansy, Arvella Merles, MD ED   08/01/2019 1102 08/03/2019 2223 DNR [id number]  Jimmy Footman, NP ED   07/11/2019 1242 07/13/2019 2316 DNR [id number]  Asencion Gowda, NP Inpatient   07/09/2019 2123 07/11/2019 1242 Partial Code [id number]  Neena Rhymes, MD ED   07/09/2019 2014 07/09/2019 2122 Full Code [id number]  Norins, Heinz Knuckles, MD ED   Advance Care Planning Activity     Disposition Plan: Status is: Inpatient  Dispo: The patient is from: [facility]              Anticipated d/c is to: Unclear at this point              Anticipated d/c date is: Transitional care team trying to work on a safe disposition              Patient currently medically stable and can be discharged once a safe disposition can occur.  Time spent: 37 minutes  [place]

## 2020-01-10 ENCOUNTER — Inpatient Hospital Stay: Payer: Medicare Other

## 2020-01-10 DIAGNOSIS — L03115 Cellulitis of right lower limb: Secondary | ICD-10-CM | POA: Diagnosis not present

## 2020-01-10 LAB — GLUCOSE, CAPILLARY
Glucose-Capillary: 150 mg/dL — ABNORMAL HIGH (ref 70–99)
Glucose-Capillary: 253 mg/dL — ABNORMAL HIGH (ref 70–99)
Glucose-Capillary: 161 mg/dL — ABNORMAL HIGH (ref 70–99)
Glucose-Capillary: 93 mg/dL (ref 70–99)

## 2020-01-10 MED ORDER — FUROSEMIDE 10 MG/ML IJ SOLN
40.0000 mg | Freq: Once | INTRAMUSCULAR | Status: AC
  Administered 2020-01-11: 40 mg via INTRAVENOUS
  Filled 2020-01-10: qty 4

## 2020-01-10 MED ORDER — INSULIN ASPART 100 UNIT/ML ~~LOC~~ SOLN
2.0000 [IU] | Freq: Three times a day (TID) | SUBCUTANEOUS | Status: DC
  Administered 2020-01-10 – 2020-01-15 (×8): 2 [IU] via SUBCUTANEOUS
  Filled 2020-01-10 (×8): qty 1

## 2020-01-10 MED ORDER — GERHARDT'S BUTT CREAM
TOPICAL_CREAM | Freq: Every day | CUTANEOUS | Status: DC
  Filled 2020-01-10 (×2): qty 1

## 2020-01-10 NOTE — Progress Notes (Signed)
Inpatient Diabetes Program Recommendations  AACE/ADA: New Consensus Statement on Inpatient Glycemic Control (2015)  Target Ranges:  Prepandial:   less than 140 mg/dL      Peak postprandial:   less than 180 mg/dL (1-2 hours)      Critically ill patients:  140 - 180 mg/dL   Lab Results  Component Value Date   GLUCAP 253 (H) 01/10/2020   HGBA1C 8.2 (H) 01/05/2020    Review of Glycemic Control Results for Karla Sanchez, Karla Sanchez (MRN [id number]) as of 01/10/2020 13:36  Ref. Range 01/09/2020 11:46 01/09/2020 17:10 01/09/2020 20:45 01/10/2020 08:05 01/10/2020 11:56  Glucose-Capillary Latest Ref Range: 70 - 99 mg/dL 356 (H) 227 (H) 276 (H) 150 (H) 253 (H)   Diabetes history: DM2 Outpatient Diabetes medications: Lantus 15 QHS + Humalog TID (no range listed)  Current orders for Inpatient glycemic control: Lantus 8 units qd + Novolog moderate correction tid + hs 0-5 units  Inpatient Diabetes Program Recommendations:    Noted postprandial CBGs elevated -Add Novolog 2 units tid meal coverage if eats 50%  Thank you, Bethena Roys E. Raywood Wailes, RN, MSN, CDE  Diabetes Coordinator Inpatient Glycemic Control Team Team Pager [phone] (8am-5pm) 01/10/2020 1:43 PM

## 2020-01-10 NOTE — Plan of Care (Signed)
  Problem: Clinical Measurements: Goal: Will remain free from infection Outcome: Progressing Goal: Respiratory complications will improve Outcome: Progressing Goal: Cardiovascular complication will be avoided Outcome: Progressing   Problem: Nutrition: Goal: Adequate nutrition will be maintained Outcome: Progressing

## 2020-01-10 NOTE — Progress Notes (Signed)
PROGRESS NOTE    Karla Sanchez  OZH:[id number] DOB: Aug 11, 1940 DOA: 01/04/2020 PCP: Alfredia Client, MD  Brief Narrative:   79 y.o. 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 Patient was recently discharged from [facility] after treatment for bilateral lower extremity cellulitis as well as right labial cellulitis. Patient also complains of a rash over her pannus as well as under her breast for which she has been applying Desitin without any significant improvement.  Has not been [place] for 6days.  Attempt discharge been hampered by unsafe discharge plans.  Patient is refused go nursing facility in any case does not really have any skilled needs at this time.  This morning was complaining of shortness of breath and lack of response to Desitin.   Assessment & Plan:   Principal Problem:   Cellulitis of right leg Active Problems:   COPD exacerbation (HCC)   Chronic diastolic CHF (congestive heart failure) (HCC)   Type 2 diabetes mellitus with stage 3 chronic kidney disease (HCC)   Chronic kidney disease, stage 3b (HCC)   Chronic respiratory failure with hypoxia (HCC)   Obesity (BMI 30-39.9)   Fungal skin infection   Weakness   Short-term memory loss   B12 deficiency  Fungal infection on buttock and skin folds with erythema Patient response to Desitin has been limited Plan: Add Gerhardt Butt cream Continue antifungal nystatin powder Ensure frequent turns and cleaning  Right lower extremity cellulitis Finished course of p.o. Keflex  Shortness of breath Not requiring oxygen Interstitial edema noted on chest x-ray Already on p.o. Lasix We will give an additional IV Lasix dose 40 mg x 1  Chronic  diastolic congestive heart failure P.o. 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  COPD Chronic hypoxic respiratory failure Not acutely exacerbated Continue supplemental oxygen As needed bronchodilators  Gout Colchicine  Weakness Functional decline Patient is able to work physical therapy.  Is at her baseline level of mobility.  Recommend home health.  Patient has an unstable living situation at the moment.  Disposition is an issue    DVT prophylaxis: Lovenox Code Status: Full Family Communication: None today Disposition Plan:Status is: Inpatient  Remains inpatient appropriate because:Inpatient level of care appropriate due to severity of illness   Dispo: The patient is from: Home              Anticipated d/c is to: ALF              Anticipated d/c date is: 1 day              Patient currently is not medically stable to d/c.         Consultants:   None  Procedures:   None  Antimicrobials:   None   Subjective: Seen and examined.  Report some skin irritation.  Also reports some shortness of breath.  Objective: Vitals:   01/09/20 1550 01/10/20 0618 01/10/20 0805 01/10/20 1529  BP: (!) 158/53  (!) 157/61 (!) 166/74  Pulse: 70  63 77  Resp: 18  17 18   Temp: 99.1 F (37.3 C)  98.1 F (36.7 C) 98.2 F (36.8  C)  TempSrc: Oral     SpO2: 97%  92% 91%  Weight:  115.1 kg    Height:        Intake/Output Summary (Last 24 hours) at 01/10/2020 1617 Last data filed at 01/10/2020 1300 Gross per 24 hour  Intake 0 ml  Output --  Net 0 ml   Filed Weights   01/03/20 1956 01/10/20 0618  Weight: 99.8 kg 115.1 kg    Examination:  General exam: Appears calm and comfortable  Respiratory system: Mild bibasilar crackles.  Normal work of breathing.  Room air Cardiovascular system: S1 & S2  heard, RRR. No JVD, murmurs, rubs, gallops or clicks. No pedal edema. Gastrointestinal system: Abdomen is nondistended, soft and nontender. No organomegaly or masses felt. Normal bowel sounds heard. Central nervous system: Alert and oriented. No focal neurological deficits. Extremities: Symmetric 5 x 5 power. Skin: No rashes, lesions or ulcers Psychiatry: Judgement and insight appear normal. Mood & affect appropriate.     Data Reviewed: I have personally reviewed following labs and imaging studies  CBC: Recent Labs  Lab 01/03/20 1959 01/05/20 1352 01/06/20 0748  WBC 12.8* 10.9* 10.2  NEUTROABS  --   --  6.8  HGB 11.5* 11.0* 10.4*  HCT 38.1 37.0 35.5*  MCV 77.4* 79.4* 79.4*  PLT 315 279 619   Basic Metabolic Panel: Recent Labs  Lab 01/03/20 1959 01/05/20 1352 01/06/20 0748 01/07/20 0912 01/08/20 0828  NA 143 139 138 140 139  K 4.2 4.6 4.5 4.3 4.8  CL 102 101 100 101 98  CO2 27 29 28 30  32  GLUCOSE 201* 165* 159* 220* 154*  BUN 38* 35* 34* 30* 27*  CREATININE 1.08* 1.46* 1.32* 1.33* 1.29*  CALCIUM 8.4* 8.2* 8.2* 8.1* 8.5*   GFR: Estimated Creatinine Clearance: 45.6 mL/min (A) (by C-G formula based on SCr of 1.29 mg/dL (H)). Liver Function Tests: No results for input(s): AST, ALT, ALKPHOS, BILITOT, PROT, ALBUMIN in the last 168 hours. No results for input(s): LIPASE, AMYLASE in the last 168 hours. No results for input(s): AMMONIA in the last 168 hours. Coagulation Profile: No results for input(s): INR, PROTIME in the last 168 hours. Cardiac Enzymes: No results for input(s): CKTOTAL, CKMB, CKMBINDEX, TROPONINI in the last 168 hours. BNP (last 3 results) No results for input(s): PROBNP in the last 8760 hours. HbA1C: No results for input(s): HGBA1C in the last 72 hours. CBG: Recent Labs  Lab 01/09/20 1146 01/09/20 1710 01/09/20 2045 01/10/20 0805 01/10/20 1156  GLUCAP 356* 227* 276* 150* 253*   Lipid Profile: No results for input(s): CHOL, HDL, LDLCALC,  TRIG, CHOLHDL, LDLDIRECT in the last 72 hours. Thyroid Function Tests: No results for input(s): TSH, T4TOTAL, FREET4, T3FREE, THYROIDAB in the last 72 hours. Anemia Panel: No results for input(s): VITAMINB12, FOLATE, FERRITIN, TIBC, IRON, RETICCTPCT in the last 72 hours. Sepsis Labs: No results for input(s): PROCALCITON, LATICACIDVEN in the last 168 hours.  Recent Results (from the past 240 hour(s))  Respiratory Panel by RT PCR (Flu A&B, Covid) - Nasopharyngeal Swab     Status: None   Collection Time: 01/05/20  4:17 AM   Specimen: Nasopharyngeal Swab  Result Value Ref Range Status   SARS Coronavirus 2 by RT PCR NEGATIVE NEGATIVE Final    Comment: (NOTE) SARS-CoV-2 target nucleic acids are NOT DETECTED.  The SARS-CoV-2 RNA is generally detectable in upper respiratoy specimens during the acute phase of infection. The lowest concentration of SARS-CoV-2 viral copies this assay can detect is 131  copies/mL. A negative result does not preclude SARS-Cov-2 infection and should not be used as the sole basis for treatment or other patient management decisions. A negative result may occur with  improper specimen collection/handling, submission of specimen other than nasopharyngeal swab, presence of viral mutation(s) within the areas targeted by this assay, and inadequate number of viral copies (<131 copies/mL). A negative result must be combined with clinical observations, patient history, and epidemiological information. The expected result is Negative.  Fact Sheet for Patients:  PinkCheek.be  Fact Sheet for Healthcare Providers:  GravelBags.it  This test is no t yet approved or cleared by the [place] FDA and  has been authorized for detection and/or diagnosis of SARS-CoV-2 by FDA under an Emergency Use Authorization (EUA). This EUA will remain  in effect (meaning this test can be used) for the duration of the COVID-19  declaration under Section 564(b)(1) of the Act, 21 U.S.C. section 360bbb-3(b)(1), unless the authorization is terminated or revoked sooner.     Influenza A by PCR NEGATIVE NEGATIVE Final   Influenza B by PCR NEGATIVE NEGATIVE Final    Comment: (NOTE) The Xpert Xpress SARS-CoV-2/FLU/RSV assay is intended as an aid in  the diagnosis of influenza from Nasopharyngeal swab specimens and  should not be used as a sole basis for treatment. Nasal washings and  aspirates are unacceptable for Xpert Xpress SARS-CoV-2/FLU/RSV  testing.  Fact Sheet for Patients: PinkCheek.be  Fact Sheet for Healthcare Providers: GravelBags.it  This test is not yet approved or cleared by the [place] FDA and  has been authorized for detection and/or diagnosis of SARS-CoV-2 by  FDA under an Emergency Use Authorization (EUA). This EUA will remain  in effect (meaning this test can be used) for the duration of the  Covid-19 declaration under Section 564(b)(1) of the Act, 21  U.S.C. section 360bbb-3(b)(1), unless the authorization is  terminated or revoked. Performed at [facility], [address] [place], [place] [address]          Radiology Studies: [facility] Chest [place] 1 View  Result Date: 01/10/2020 CLINICAL DATA:  Shortness of breath.  Cellulitis EXAM: PORTABLE CHEST 1 VIEW COMPARISON:  Seven days ago FINDINGS: New vascular prominence. There is likely trace pleural effusions. Hyperinflation with diaphragm flattening. Cardiomegaly IMPRESSION: 1. Interval interstitial edema/vascular congestion. 2. COPD. Electronically Signed   By: Monte Fantasia M.D.   On: 01/10/2020 10:21        Scheduled Meds: . aspirin EC  81 mg Oral Daily  . cephALEXin  500 mg Oral Q8H  . colchicine  0.6 mg Oral Daily  . docusate sodium  100 mg Oral BID  . enoxaparin (LOVENOX) injection  40 mg Subcutaneous Q24H  . ferrous sulfate  325 mg Oral Daily  .  fluticasone  2 spray Each Nare Daily  . furosemide  40 mg Intravenous Once  . furosemide  40 mg Oral q AM   And  . furosemide  40 mg Oral Q1500  . Gerhardt's butt cream   Topical Daily  . hydrocortisone   Rectal BID  . insulin aspart  0-15 Units Subcutaneous TID WC  . insulin aspart  0-5 Units Subcutaneous QHS  . insulin aspart  2 Units Subcutaneous TID WC  . insulin glargine  8 Units Subcutaneous QHS  . lactulose  10 g Oral Daily  . loratadine  10 mg Oral Daily  . metoprolol tartrate  12.5 mg Oral BID  . mirtazapine  15 mg Oral QHS  .  nystatin   Topical BID  . potassium chloride  10 mEq Oral Daily  . QUEtiapine  12.5 mg Oral QHS  . senna-docusate  1 tablet Oral Daily  . sodium chloride flush  3 mL Intravenous Q12H  . vitamin B-12  1,000 mcg Oral Daily   Continuous Infusions: . sodium chloride       LOS: 6 days    Time spent: 25 minutes    Sidney Ace, MD [facility] Pager [phone]  If 7PM-7AM, please contact night-coverage 01/10/2020, 4:17 PM

## 2020-01-10 NOTE — TOC Progression Note (Signed)
Transition of Care [facility]) - Progression Note    Patient Details  Name: Karla Sanchez MRN: [id number] Date of Birth: 08/28/1940  Transition of Care [facility] CM/SW [place], RN Phone Number: 01/10/2020, 2:18 PM  Clinical Narrative:     10am: RNCM met with patient at bedside. Patient lying in bed, sleepy reports that she didn't sleep well last night due to visitor's in the hall in the middle of the night. Discussed that this was more than likely due to an admission in a neighboring room. RNCM attempted to engage patient about her discharge plans however patient reports that right now she believes the MD is working on finding her a place, reminded patient that this is something that MD does not do and that a list of homes in the county had been provided to her yesterday. Patient relays that her short term memory has gotten so bad that she does not even remember talking to this RNCM yesterday. Patient does however report that she will start calling some of the facilities on the list.   2:10pm: RNCM reached out to [employer] with [facility] group [facility], however he reports that he wouldn't be able to come evaluate patient until next week. RNCM reached out to [facility] with [facility] and left VM for return call. RNCM reached out to [place] with [facility], she reported that information could be e-mailed and they would look at it and possibly come see patient tomorrow. RNCM e-mailed last progress note as well as FL-2 for review.    Expected Discharge Plan: [place] Barriers to Discharge: Continued Medical Work up  Expected Discharge Plan and Services Expected Discharge Plan: [place] In-house Referral: Clinical Social Work   Post Acute Care Choice: Durable Medical Equipment Living arrangements for the past 2 months: Hotel/Motel Expected Discharge Date: 01/09/20                                     Social Determinants of  Health (SDOH) Interventions    Readmission Risk Interventions Readmission Risk Prevention Plan 10/24/2019  Medication Review (RN Care Manager) Complete  HRI or [place] Patient Refused

## 2020-01-10 NOTE — Plan of Care (Signed)
Pt refusing to be awakened until 8am. States she is not wet despite the smell of urine. Problem: Clinical Measurements: Goal: Will remain free from infection Outcome: Progressing Goal: Respiratory complications will improve Outcome: Progressing Goal: Cardiovascular complication will be avoided Outcome: Progressing

## 2020-01-10 NOTE — Progress Notes (Signed)
Patient is refusing enoxaparin (last dose 10/17) and mechanical VTE ppx. D/w patient the risk of clots in the setting of being hospitalized and patient verbalized understanding. Patient stated that she does not feel she needs blood thinners.   If patient is open to oral VTE ppx, may consider Xarelto 10 mg daily.   Kristeen Miss, PharmD Clinical Pharmacist

## 2020-01-10 NOTE — Progress Notes (Signed)
Pt yelling out because she hears another nurse doing an admission. The nurse tried to close her door and she began yelling out again to "open her door!". This nurse tried to explain that we have another pt next to her that is being admitted, pt interrupts and  Says "no there's not!" and keeps shouting that she needs to get some sleep.  Also, pt refusing VS and does not want anyone waking her up again this shift. She will not use chuck pads or soft pads between her legs to keep dry. Pt told this nurse " don't suggest anything else."

## 2020-01-11 DIAGNOSIS — L03115 Cellulitis of right lower limb: Secondary | ICD-10-CM | POA: Diagnosis not present

## 2020-01-11 LAB — GLUCOSE, CAPILLARY
Glucose-Capillary: 139 mg/dL — ABNORMAL HIGH (ref 70–99)
Glucose-Capillary: 196 mg/dL — ABNORMAL HIGH (ref 70–99)
Glucose-Capillary: 164 mg/dL — ABNORMAL HIGH (ref 70–99)

## 2020-01-11 NOTE — Care Management Important Message (Signed)
Important Message  Patient Details  Name: Karla Sanchez MRN: [id number] Date of Birth: [birth year]   Medicare Important Message Given:  Yes     Juliann Pulse A Amdrew Oboyle 01/11/2020, 11:11 AM

## 2020-01-11 NOTE — Progress Notes (Signed)
PT Cancellation Note  Patient Details Name: Karla Sanchez MRN: [id number] DOB: January 10, 1941   Cancelled Treatment:    Reason Eval/Treat Not Completed: Patient declined to participate with PT services this date stating "I don't like exercise and I can get to the chair myself".  Will attempt to see pt at a future date/time as medically appropriate.     Linus Salmons PT, DPT 01/11/20, 2:02 PM

## 2020-01-11 NOTE — Progress Notes (Signed)
Pt refused vital signs and blood sugar at 1700.

## 2020-01-11 NOTE — Plan of Care (Signed)
?  Problem: Clinical Measurements: ?Goal: Ability to maintain clinical measurements within normal limits will improve ?Outcome: Progressing ?Goal: Will remain free from infection ?Outcome: Progressing ?Goal: Diagnostic test results will improve ?Outcome: Progressing ?  ?

## 2020-01-11 NOTE — TOC Progression Note (Signed)
Transition of Care [facility]) - Progression Note    Patient Details  Name: Hydia Copelin MRN: [id number] Date of Birth: 02-10-1941  Transition of Care [facility]) CM/SW [place], RN Phone Number: 01/11/2020, 3:00 PM  Clinical Narrative:   RNCM met with patient in room 3 different times, each time she stated she didn't remember anyone coming in earlier. Kasey with [facility] came to assess patient but after assessment reported that he didn't think that he would be able to offer patient a bed at this time due to inappropriateness for his facility.     Expected Discharge Plan: [place] Barriers to Discharge: Continued Medical Work up  Expected Discharge Plan and Services Expected Discharge Plan: [place] In-house Referral: Clinical Social Work   Post Acute Care Choice: Durable Medical Equipment Living arrangements for the past 2 months: Hotel/Motel Expected Discharge Date: 01/09/20                                     Social Determinants of Health (SDOH) Interventions    Readmission Risk Interventions Readmission Risk Prevention Plan 10/24/2019  Medication Review (RN Care Manager) Complete  HRI or [place] Patient Refused

## 2020-01-11 NOTE — Progress Notes (Signed)
PROGRESS NOTE    Karla Sanchez  MEQ:[id number] DOB: 01-Apr-1940 DOA: 01/04/2020 PCP: Alfredia Client, MD  Brief Narrative:   79 y.o. 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 Patient was recently discharged from [facility] after treatment for bilateral lower extremity cellulitis as well as right labial cellulitis. Patient also complains of a rash over her pannus as well as under her breast for which she has been applying Desitin without any significant improvement.  Patient seen and examined.  Shortness of breath improved.  On home rate of oxygen.  Patient is at a complex disposition.  She does not have any skilled needs however at this point considering her advanced age, higher level debility and her homelessness we do not have a safe disposition plan.  Case management is working on evaluations from local group homes.   Assessment & Plan:   Principal Problem:   Cellulitis of right leg Active Problems:   COPD exacerbation (HCC)   Chronic diastolic CHF (congestive heart failure) (HCC)   Type 2 diabetes mellitus with stage 3 chronic kidney disease (HCC)   Chronic kidney disease, stage 3b (HCC)   Chronic respiratory failure with hypoxia (HCC)   Obesity (BMI 30-39.9)   Fungal skin infection   Weakness   Short-term memory loss   B12 deficiency  Fungal infection on buttock and skin folds with erythema Patient response to Desitin has been limited Plan: Add Gerhardt Butt cream Continue antifungal nystatin powder Ensure frequent turns and cleaning  Right lower extremity cellulitis Finished course of p.o. Keflex  Shortness of breath Back on home rate of oxygen One-time IV Lasix given in addition to p.o.  Lasix per home dose Shortness of breath improved  Chronic diastolic congestive heart failure P.o. Lasix as above Metoprolol  Type 2 diabetes mellitus with chronic kidney disease stage III POA Continue basal bolus insulin regimen Monitor creatinine  Obesity BMI 35.5  B12 deficiency Replaced IM n.p.o.  COPD Chronic hypoxic respiratory failure Not acutely exacerbated Continue supplemental oxygen As needed bronchodilators  Gout Colchicine  Weakness Functional decline Patient is able to work physical therapy.  Is at her baseline level of mobility.  Recommend home health.  Patient has an unstable living situation at the moment.  Disposition is an issue    DVT prophylaxis: Lovenox Code Status: Full Family Communication: None today Disposition Plan:Status is: Inpatient  Remains inpatient appropriate because:Unsafe d/c plan   Dispo: The patient is from: Home              Anticipated d/c is to: Group home              Anticipated d/c date is: 1 day              Patient currently is medically stable to d/c.[place] Kitchen  Patient is currently medically stable for discharge.  However disposition is an issue.  This patient has an unstable living situation.  She was previously living at a hotel however is unable to return there.  She no longer has a stable place of residence and may be living out of her [name].  Considering her advanced age, overall frailty it would be last resort to discharge this patient to "home".  Per case management there is representative from the group home that will  evaluate patient today.  If we have a safe disposition patient continues to refuse disposition to group home then we may have no option spoke to discharge home.  Consultants:   None  Procedures:   None  Antimicrobials:   None   Subjective: Seen and examined.  Shortness of breath improved.  Continues to endorse skin irritation  Objective: Vitals:   01/10/20 0618 01/10/20 0805 01/10/20 1529  01/11/20 0747  BP:  (!) 157/61 (!) 166/74 (!) 130/54  Pulse:  63 77 79  Resp:  17 18 17   Temp:  98.1 F (36.7 C) 98.2 F (36.8 C) 98.4 F (36.9 C)  TempSrc:      SpO2:  92% 91% 97%  Weight: 115.1 kg     Height:        Intake/Output Summary (Last 24 hours) at 01/11/2020 1247 Last data filed at 01/11/2020 1039 Gross per 24 hour  Intake 600 ml  Output --  Net 600 ml   Filed Weights   01/03/20 1956 01/10/20 0618  Weight: 99.8 kg 115.1 kg    Examination:  General exam: No acute distress.  Appears frail and chronically ill Respiratory system: Mild bibasilar crackles.  Normal work of breathing.  2 L  cardiovascular system: S1-S2 heard, regular rate and rhythm, no murmurs Gastrointestinal system: Obese, nontender, nondistended, normal bowel sounds  Central nervous system: Alert and oriented. No focal neurological deficits. Extremities: Symmetric 5 x 5 power. Skin: No rashes, lesions or ulcers Psychiatry: Judgement and insight appear impaired. Mood & affect appropriate.     Data Reviewed: I have personally reviewed following labs and imaging studies  CBC: Recent Labs  Lab 01/05/20 1352 01/06/20 0748  WBC 10.9* 10.2  NEUTROABS  --  6.8  HGB 11.0* 10.4*  HCT 37.0 35.5*  MCV 79.4* 79.4*  PLT 279 485   Basic Metabolic Panel: Recent Labs  Lab 01/05/20 1352 01/06/20 0748 01/07/20 0912 01/08/20 0828  NA 139 138 140 139  K 4.6 4.5 4.3 4.8  CL 101 100 101 98  CO2 29 28 30  32  GLUCOSE 165* 159* 220* 154*  BUN 35* 34* 30* 27*  CREATININE 1.46* 1.32* 1.33* 1.29*  CALCIUM 8.2* 8.2* 8.1* 8.5*   GFR: Estimated Creatinine Clearance: 45.6 mL/min (A) (by C-G formula based on SCr of 1.29 mg/dL (H)). Liver Function Tests: No results for input(s): AST, ALT, ALKPHOS, BILITOT, PROT, ALBUMIN in the last 168 hours. No results for input(s): LIPASE, AMYLASE in the last 168 hours. No results for input(s): AMMONIA in the last 168 hours. Coagulation Profile: No results for  input(s): INR, PROTIME in the last 168 hours. Cardiac Enzymes: No results for input(s): CKTOTAL, CKMB, CKMBINDEX, TROPONINI in the last 168 hours. BNP (last 3 results) No results for input(s): PROBNP in the last 8760 hours. HbA1C: No results for input(s): HGBA1C in the last 72 hours. CBG: Recent Labs  Lab 01/10/20 1156 01/10/20 1625 01/10/20 2021 01/11/20 0748 01/11/20 1156  GLUCAP 253* 161* 93 139* 196*   Lipid Profile: No results for input(s): CHOL, HDL, LDLCALC, TRIG, CHOLHDL, LDLDIRECT in the last 72 hours. Thyroid Function Tests: No results for input(s): TSH, T4TOTAL, FREET4, T3FREE, THYROIDAB in the last 72 hours. Anemia Panel: No results for input(s): VITAMINB12, FOLATE, FERRITIN, TIBC, IRON, RETICCTPCT in the last 72 hours. Sepsis Labs: No results for input(s): PROCALCITON, LATICACIDVEN in the last 168 hours.  Recent Results (from the past 240 hour(s))  Respiratory Panel by RT PCR (Flu A&B, Covid) - Nasopharyngeal Swab  Status: None   Collection Time: 01/05/20  4:17 AM   Specimen: Nasopharyngeal Swab  Result Value Ref Range Status   SARS Coronavirus 2 by RT PCR NEGATIVE NEGATIVE Final    Comment: (NOTE) SARS-CoV-2 target nucleic acids are NOT DETECTED.  The SARS-CoV-2 RNA is generally detectable in upper respiratoy specimens during the acute phase of infection. The lowest concentration of SARS-CoV-2 viral copies this assay can detect is 131 copies/mL. A negative result does not preclude SARS-Cov-2 infection and should not be used as the sole basis for treatment or other patient management decisions. A negative result may occur with  improper specimen collection/handling, submission of specimen other than nasopharyngeal swab, presence of viral mutation(s) within the areas targeted by this assay, and inadequate number of viral copies (<131 copies/mL). A negative result must be combined with clinical observations, patient history, and epidemiological information.  The expected result is Negative.  Fact Sheet for Patients:  PinkCheek.be  Fact Sheet for Healthcare Providers:  GravelBags.it  This test is no t yet approved or cleared by the [place] FDA and  has been authorized for detection and/or diagnosis of SARS-CoV-2 by FDA under an Emergency Use Authorization (EUA). This EUA will remain  in effect (meaning this test can be used) for the duration of the COVID-19 declaration under Section 564(b)(1) of the Act, 21 U.S.C. section 360bbb-3(b)(1), unless the authorization is terminated or revoked sooner.     Influenza A by PCR NEGATIVE NEGATIVE Final   Influenza B by PCR NEGATIVE NEGATIVE Final    Comment: (NOTE) The Xpert Xpress SARS-CoV-2/FLU/RSV assay is intended as an aid in  the diagnosis of influenza from Nasopharyngeal swab specimens and  should not be used as a sole basis for treatment. Nasal washings and  aspirates are unacceptable for Xpert Xpress SARS-CoV-2/FLU/RSV  testing.  Fact Sheet for Patients: PinkCheek.be  Fact Sheet for Healthcare Providers: GravelBags.it  This test is not yet approved or cleared by the [place] FDA and  has been authorized for detection and/or diagnosis of SARS-CoV-2 by  FDA under an Emergency Use Authorization (EUA). This EUA will remain  in effect (meaning this test can be used) for the duration of the  Covid-19 declaration under Section 564(b)(1) of the Act, 21  U.S.C. section 360bbb-3(b)(1), unless the authorization is  terminated or revoked. Performed at [facility], [address] [place], [place] [address]          Radiology Studies: [facility] Chest [place] 1 View  Result Date: 01/10/2020 CLINICAL DATA:  Shortness of breath.  Cellulitis EXAM: PORTABLE CHEST 1 VIEW COMPARISON:  Seven days ago FINDINGS: New vascular prominence. There is likely trace pleural  effusions. Hyperinflation with diaphragm flattening. Cardiomegaly IMPRESSION: 1. Interval interstitial edema/vascular congestion. 2. COPD. Electronically Signed   By: Monte Fantasia M.D.   On: 01/10/2020 10:21        Scheduled Meds: . aspirin EC  81 mg Oral Daily  . cephALEXin  500 mg Oral Q8H  . colchicine  0.6 mg Oral Daily  . docusate sodium  100 mg Oral BID  . ferrous sulfate  325 mg Oral Daily  . fluticasone  2 spray Each Nare Daily  . furosemide  40 mg Oral q AM   And  . furosemide  40 mg Oral Q1500  . Gerhardt's butt cream   Topical Daily  . hydrocortisone   Rectal BID  . insulin aspart  0-15 Units Subcutaneous TID WC  . insulin aspart  0-5 Units  Subcutaneous QHS  . insulin aspart  2 Units Subcutaneous TID WC  . insulin glargine  8 Units Subcutaneous QHS  . lactulose  10 g Oral Daily  . loratadine  10 mg Oral Daily  . metoprolol tartrate  12.5 mg Oral BID  . mirtazapine  15 mg Oral QHS  . nystatin   Topical BID  . potassium chloride  10 mEq Oral Daily  . QUEtiapine  12.5 mg Oral QHS  . senna-docusate  1 tablet Oral Daily  . sodium chloride flush  3 mL Intravenous Q12H  . vitamin B-12  1,000 mcg Oral Daily   Continuous Infusions: . sodium chloride       LOS: 7 days    Time spent: 15 minutes    Sidney Ace, MD [facility] Pager [phone]  If 7PM-7AM, please contact night-coverage 01/11/2020, 12:47 PM

## 2020-01-11 NOTE — Progress Notes (Signed)
Patient refuses all HS medications except gehradt's butt cream and trazodone. This nurse attempts to reeducate patient on importance of complying with medication regimen. Patient voices understanding but still refuses medications. Will continue to monitor.

## 2020-01-11 NOTE — Plan of Care (Signed)
  Problem: Health Behavior/Discharge Planning: Goal: Ability to manage health-related needs will improve Outcome: Not Progressing   

## 2020-01-12 DIAGNOSIS — L03115 Cellulitis of right lower limb: Secondary | ICD-10-CM | POA: Diagnosis not present

## 2020-01-12 MED ORDER — LIDOCAINE 5 % EX PTCH
1.0000 | MEDICATED_PATCH | CUTANEOUS | Status: DC
  Administered 2020-01-12 – 2020-01-14 (×2): 1 via TRANSDERMAL
  Filled 2020-01-12 (×4): qty 1

## 2020-01-12 MED ORDER — METHOCARBAMOL 500 MG PO TABS: 500.0000 mg | ORAL_TABLET | Freq: Three times a day (TID) | ORAL | Status: DC | PRN

## 2020-01-12 MED ORDER — ALPRAZOLAM 0.25 MG PO TABS
0.2500 mg | ORAL_TABLET | Freq: Every day | ORAL | Status: AC
Start: 2020-01-12 — End: 2020-01-13

## 2020-01-12 NOTE — Progress Notes (Signed)
PT Cancellation Note  Patient Details Name: Karla Sanchez MRN: [id number] DOB: 1941-02-02   Cancelled Treatment:    Reason Eval/Treat Not Completed: Patient declined to participate with PT services this date secondary to pain in her buttocks.  Per patient nursing aware and have addressed the issue.  Will attempt to see pt at a future date/time as medically appropriate.     Linus Salmons PT, DPT 01/12/20, 3:20 PM

## 2020-01-12 NOTE — Progress Notes (Signed)
Pt refused toileting at this time

## 2020-01-12 NOTE — Plan of Care (Signed)
  Problem: Education: Goal: Knowledge of General Education information will improve Description: Including pain rating scale, medication(s)/side effects and non-pharmacologic comfort measures Outcome: Not Progressing   

## 2020-01-12 NOTE — TOC Progression Note (Signed)
Transition of Care [facility]) - Progression Note    Patient Details  Name: Briante Loveall MRN: [id number] Date of Birth: 1940-12-27  Transition of Care [facility]) CM/SW [place], RN Phone Number: 01/12/2020, 1:42 PM  Clinical Narrative:   RNCM met with patient and MD in room to discuss discharge planning. Patient eventually reports that she will leave and drive back to [place]. MD plans to discharge tomorrow.     Expected Discharge Plan: [place] Barriers to Discharge: Continued Medical Work up  Expected Discharge Plan and Services Expected Discharge Plan: [place] In-house Referral: Clinical Social Work   Post Acute Care Choice: Durable Medical Equipment Living arrangements for the past 2 months: Hotel/Motel Expected Discharge Date: 01/09/20                                     Social Determinants of Health (SDOH) Interventions    Readmission Risk Interventions Readmission Risk Prevention Plan 10/24/2019  Medication Review (RN Care Manager) Complete  HRI or [place] Patient Refused

## 2020-01-12 NOTE — Plan of Care (Signed)
  Problem: Health Behavior/Discharge Planning: Goal: Ability to manage health-related needs will improve Outcome: Not Progressing   

## 2020-01-12 NOTE — Progress Notes (Signed)
PROGRESS NOTE    Karla Sanchez  FOY:[id number] DOB: 07/10/1940 DOA: 01/04/2020 PCP: Alfredia Client, MD  Brief Narrative:   79 y.o. 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 Patient was recently discharged from [facility] after treatment for bilateral lower extremity cellulitis as well as right labial cellulitis. Patient also complains of a rash over her pannus as well as under her breast for which she has been applying Desitin without any significant improvement.  Patient seen and examined.  Shortness of breath improved.  On home rate of oxygen.  Patient is at a complex disposition.  She does not have any skilled needs however at this point considering her advanced age, higher level debility and her homelessness we do not have a safe disposition plan.  Case management is working on evaluations from local group homes.  10/22: Patient seen and examined this morning.  Has been sleeping, refusing turns and vitals and nursing care.  Myself and TOC representative [place] had a long conversation with patient at bedside.  Unfortunately the patient has been refused from a group home who came to evaluate yesterday.  She currently does not have a stable living situation and endorses that she has been trying to find one unsuccessfully.  She is complaining of poor sleep and lower back pain   Assessment & Plan:   Principal Problem:   Cellulitis of right leg Active Problems:   COPD exacerbation (HCC)   Chronic diastolic CHF (congestive heart failure) (HCC)   Type 2 diabetes mellitus with stage 3 chronic kidney disease (HCC)   Chronic kidney disease, stage 3b (HCC)   Chronic respiratory failure with hypoxia (HCC)   Obesity  (BMI 30-39.9)   Fungal skin infection   Weakness   Short-term memory loss   B12 deficiency  Fungal infection on buttock and skin folds with erythema Patient response to Desitin has been limited Plan: Gerhardt Butt cream Continue antifungal nystatin powder Ensure frequent turns and cleaning  Right lower extremity cellulitis Finished course of p.o. Keflex  Shortness of breath Back on home rate of oxygen Shortness of breath improved PO lasix per home dose  Chronic diastolic congestive heart failure P.o. Lasix as above Metoprolol  Type 2 diabetes mellitus with chronic kidney disease stage III POA Continue basal bolus insulin regimen Monitor creatinine  Obesity BMI 35.5  B12 deficiency Replaced IM n.p.o.  COPD Chronic hypoxic respiratory failure Not acutely exacerbated Continue supplemental oxygen As needed bronchodilators  Gout Colchicine  Weakness Functional decline Patient is able to work physical therapy.  Is at her baseline level of mobility.  Recommend home health.  Patient has an unstable living situation at the moment.  Disposition is an issue    DVT prophylaxis: Lovenox Code Status: Full Family Communication: None today Disposition Plan:Status is: Inpatient  Remains inpatient appropriate because:Unsafe d/c plan   Dispo: The patient is from: Home              Anticipated d/c is to: Home              Anticipated d/c date is: 1 day              Patient currently is medically stable to d/c.Marland Kitchen  Patient is currently medically stable for discharge.  However disposition is an  issue.  This patient has an unstable living situation.  She was previously living at a hotel however is unable to return there.  She no longer has a stable place of residence and may be living out of her [name].  Considering her advanced age, overall frailty it would be last resort to discharge this patient to "home".  Case management and I discussed at length with patient today.  Given her  advanced age and frailty we will allow for additional 24 hours of inpatient stay and then plan for discharge Saturday morning 01/13/2020.  Both case management and the patient will look into living situations even if that means in another hotel.  Consultants:   None  Procedures:   None  Antimicrobials:   None   Subjective: Seen and examined.  Endorses back pain and poor sleep.  Objective: Vitals:   01/10/20 0805 01/10/20 1529 01/11/20 0747 01/12/20 0947  BP: (!) 157/61 (!) 166/74 (!) 130/54 (!) 115/41  Pulse: 63 77 79 86  Resp: 17 18 17 18   Temp: 98.1 F (36.7 C) 98.2 F (36.8 C) 98.4 F (36.9 C) 98.4 F (36.9 C)  TempSrc:    Oral  SpO2: 92% 91% 97% 94%  Weight:      Height:       No intake or output data in the 24 hours ending 01/12/20 [place]   01/03/20 1956 01/10/20 0618  Weight: 99.8 kg 115.1 kg    Examination:  General exam: No acute distress.  Appears frail and chronically ill Respiratory system: Mild bibasilar crackles.  Normal work of breathing.  2 L  cardiovascular system: S1-S2 heard, regular rate and rhythm, no murmurs Gastrointestinal system: Obese, nontender, nondistended, normal bowel sounds  Central nervous system: Alert and oriented. No focal neurological deficits. Extremities: Decreased power bilateral lower extremities Skin: No rashes, lesions or ulcers Psychiatry: Judgement and insight appear impaired. Mood & affect appropriate.     Data Reviewed: I have personally reviewed following labs and imaging studies  CBC: Recent Labs  Lab 01/05/20 1352 01/06/20 0748  WBC 10.9* 10.2  NEUTROABS  --  6.8  HGB 11.0* 10.4*  HCT 37.0 35.5*  MCV 79.4* 79.4*  PLT 279 469   Basic Metabolic Panel: Recent Labs  Lab 01/05/20 1352 01/06/20 0748 01/07/20 0912 01/08/20 0828  NA 139 138 140 139  K 4.6 4.5 4.3 4.8  CL 101 100 101 98  CO2 29 28 30  32  GLUCOSE 165* 159* 220* 154*  BUN 35* 34* 30* 27*  CREATININE 1.46* 1.32* 1.33*  1.29*  CALCIUM 8.2* 8.2* 8.1* 8.5*   GFR: Estimated Creatinine Clearance: 45.6 mL/min (A) (by C-G formula based on SCr of 1.29 mg/dL (H)). Liver Function Tests: No results for input(s): AST, ALT, ALKPHOS, BILITOT, PROT, ALBUMIN in the last 168 hours. No results for input(s): LIPASE, AMYLASE in the last 168 hours. No results for input(s): AMMONIA in the last 168 hours. Coagulation Profile: No results for input(s): INR, PROTIME in the last 168 hours. Cardiac Enzymes: No results for input(s): CKTOTAL, CKMB, CKMBINDEX, TROPONINI in the last 168 hours. BNP (last 3 results) No results for input(s): PROBNP in the last 8760 hours. HbA1C: No results for input(s): HGBA1C in the last 72 hours. CBG: Recent Labs  Lab 01/10/20 1625 01/10/20 2021 01/11/20 0748 01/11/20 1156 01/11/20 2002  GLUCAP 161* 93 139* 196* 164*   Lipid Profile: No results for input(s): CHOL, HDL, LDLCALC, TRIG, CHOLHDL, LDLDIRECT in the last 72 hours. Thyroid Function  Tests: No results for input(s): TSH, T4TOTAL, FREET4, T3FREE, THYROIDAB in the last 72 hours. Anemia Panel: No results for input(s): VITAMINB12, FOLATE, FERRITIN, TIBC, IRON, RETICCTPCT in the last 72 hours. Sepsis Labs: No results for input(s): PROCALCITON, LATICACIDVEN in the last 168 hours.  Recent Results (from the past 240 hour(s))  Respiratory Panel by RT PCR (Flu A&B, Covid) - Nasopharyngeal Swab     Status: None   Collection Time: 01/05/20  4:17 AM   Specimen: Nasopharyngeal Swab  Result Value Ref Range Status   SARS Coronavirus 2 by RT PCR NEGATIVE NEGATIVE Final    Comment: (NOTE) SARS-CoV-2 target nucleic acids are NOT DETECTED.  The SARS-CoV-2 RNA is generally detectable in upper respiratoy specimens during the acute phase of infection. The lowest concentration of SARS-CoV-2 viral copies this assay can detect is 131 copies/mL. A negative result does not preclude SARS-Cov-2 infection and should not be used as the sole basis for  treatment or other patient management decisions. A negative result may occur with  improper specimen collection/handling, submission of specimen other than nasopharyngeal swab, presence of viral mutation(s) within the areas targeted by this assay, and inadequate number of viral copies (<131 copies/mL). A negative result must be combined with clinical observations, patient history, and epidemiological information. The expected result is Negative.  Fact Sheet for Patients:  PinkCheek.be  Fact Sheet for Healthcare Providers:  GravelBags.it  This test is no t yet approved or cleared by the [place] FDA and  has been authorized for detection and/or diagnosis of SARS-CoV-2 by FDA under an Emergency Use Authorization (EUA). This EUA will remain  in effect (meaning this test can be used) for the duration of the COVID-19 declaration under Section 564(b)(1) of the Act, 21 U.S.C. section 360bbb-3(b)(1), unless the authorization is terminated or revoked sooner.     Influenza A by PCR NEGATIVE NEGATIVE Final   Influenza B by PCR NEGATIVE NEGATIVE Final    Comment: (NOTE) The Xpert Xpress SARS-CoV-2/FLU/RSV assay is intended as an aid in  the diagnosis of influenza from Nasopharyngeal swab specimens and  should not be used as a sole basis for treatment. Nasal washings and  aspirates are unacceptable for Xpert Xpress SARS-CoV-2/FLU/RSV  testing.  Fact Sheet for Patients: PinkCheek.be  Fact Sheet for Healthcare Providers: GravelBags.it  This test is not yet approved or cleared by the [place] FDA and  has been authorized for detection and/or diagnosis of SARS-CoV-2 by  FDA under an Emergency Use Authorization (EUA). This EUA will remain  in effect (meaning this test can be used) for the duration of the  Covid-19 declaration under Section 564(b)(1) of the Act, 21    U.S.C. section 360bbb-3(b)(1), unless the authorization is  terminated or revoked. Performed at [facility], [address] [place], [place] [address]          Radiology Studies: No results found.      Scheduled Meds: . ALPRAZolam  0.25 mg Oral QHS  . aspirin EC  81 mg Oral Daily  . colchicine  0.6 mg Oral Daily  . docusate sodium  100 mg Oral BID  . ferrous sulfate  325 mg Oral Daily  . fluticasone  2 spray Each Nare Daily  . furosemide  40 mg Oral q AM   And  . furosemide  40 mg Oral Q1500  . Gerhardt's butt cream   Topical Daily  . hydrocortisone   Rectal BID  . insulin aspart  0-15 Units Subcutaneous TID WC  .  insulin aspart  0-5 Units Subcutaneous QHS  . insulin aspart  2 Units Subcutaneous TID WC  . insulin glargine  8 Units Subcutaneous QHS  . lactulose  10 g Oral Daily  . lidocaine  1 patch Transdermal Q24H  . loratadine  10 mg Oral Daily  . metoprolol tartrate  12.5 mg Oral BID  . mirtazapine  15 mg Oral QHS  . nystatin   Topical BID  . potassium chloride  10 mEq Oral Daily  . QUEtiapine  12.5 mg Oral QHS  . senna-docusate  1 tablet Oral Daily  . sodium chloride flush  3 mL Intravenous Q12H  . vitamin B-12  1,000 mcg Oral Daily   Continuous Infusions: . sodium chloride       LOS: 8 days    Time spent: 15 minutes    Sidney Ace, MD [facility] Pager [phone]  If 7PM-7AM, please contact night-coverage 01/12/2020, 12:49 PM

## 2020-01-12 NOTE — Progress Notes (Signed)
Patient refuses VS and FSBS. States no one is to enter her room until 0800 unless she asks them to.

## 2020-01-12 NOTE — Progress Notes (Signed)
Pt refused full assessment.  Asked for BP medication. Educated pt on needing a BP before taking the medication.  Pt allowed RN to do vital signs.  Refused blood glucose.  Refused most medications this a.m.

## 2020-01-13 DIAGNOSIS — L03115 Cellulitis of right lower limb: Secondary | ICD-10-CM | POA: Diagnosis not present

## 2020-01-13 LAB — GLUCOSE, CAPILLARY
Glucose-Capillary: 170 mg/dL — ABNORMAL HIGH (ref 70–99)
Glucose-Capillary: 199 mg/dL — ABNORMAL HIGH (ref 70–99)
Glucose-Capillary: 144 mg/dL — ABNORMAL HIGH (ref 70–99)

## 2020-01-13 MED ORDER — DIPHENHYDRAMINE HCL 25 MG PO CAPS
25.0000 mg | ORAL_CAPSULE | Freq: Every evening | ORAL | Status: DC | PRN
  Administered 2020-01-13 – 2020-01-14 (×2): 25 mg via ORAL
  Filled 2020-01-13 (×2): qty 1

## 2020-01-13 NOTE — Progress Notes (Signed)
Patient refused for RN to remove lidocaine patch.

## 2020-01-13 NOTE — Progress Notes (Signed)
PROGRESS NOTE    Karla Sanchez  VPX:[id number] DOB: Jun 07, 1940 DOA: 01/04/2020 PCP: Alfredia Client, MD  Brief Narrative:   79 y.o. 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 Patient was recently discharged from [facility] after treatment for bilateral lower extremity cellulitis as well as right labial cellulitis. Patient also complains of a rash over her pannus as well as under her breast for which she has been applying Desitin without any significant improvement.  Patient seen and examined.  Shortness of breath improved.  On home rate of oxygen.  Patient is at a complex disposition.  She does not have any skilled needs however at this point considering her advanced age, higher level debility and her homelessness we do not have a safe disposition plan.  Case management is working on evaluations from local group homes.  10/22: Patient seen and examined this morning.  Has been sleeping, refusing turns and vitals and nursing care.  Myself and TOC representative [place] had a long conversation with patient at bedside.  Unfortunately the patient has been refused from a group home who came to evaluate yesterday.  She currently does not have a stable living situation and endorses that she has been trying to find one unsuccessfully.  She is complaining of poor sleep and lower back pain.   At this time we have not yet secured a safe discharge plan.  Patient does not have a stable living situation and with multiple medical comorbidities at this time discharge would not be a safe option.   Assessment & Plan:   Principal Problem:   Cellulitis of right leg Active Problems:   COPD exacerbation (HCC)   Chronic diastolic CHF  (congestive heart failure) (HCC)   Type 2 diabetes mellitus with stage 3 chronic kidney disease (HCC)   Chronic kidney disease, stage 3b (HCC)   Chronic respiratory failure with hypoxia (HCC)   Obesity (BMI 30-39.9)   Fungal skin infection   Weakness   Short-term memory loss   B12 deficiency  Fungal infection on buttock and skin folds with erythema Patient response to Desitin has been limited Plan: Gerhardt Butt cream Continue antifungal nystatin powder Ensure frequent turns and cleaning  Right lower extremity cellulitis Finished course of p.o. Keflex  Shortness of breath Back on home rate of oxygen Shortness of breath improved PO lasix per home dose  Chronic diastolic congestive heart failure P.o. Lasix as above Metoprolol  Type 2 diabetes mellitus with chronic kidney disease stage III POA Continue basal bolus insulin regimen Monitor creatinine  Obesity BMI 35.5  B12 deficiency Replaced IM n.p.o.  COPD Chronic hypoxic respiratory failure Not acutely exacerbated Continue supplemental oxygen As needed bronchodilators  Gout Colchicine  Weakness Functional decline Patient is able to work physical therapy.  Is at her baseline level of mobility.  Recommend home health.  Patient has an unstable living situation at the moment.  Disposition is an issue    DVT prophylaxis: Lovenox Code Status: Full Family Communication: None today Disposition Plan:Status is: Inpatient  Remains inpatient appropriate because:Unsafe d/c plan   Dispo: The patient is from: Home              Anticipated d/c is to: Home              Anticipated  d/c date is: 1 day              Patient currently is medically stable to d/c.Marland Kitchen  Patient is currently medically stable for discharge from the hospital.  However we have not secured a safe discharge plan.  Patient currently living out of her [name].  She was previously living at a hotel however per her report there are currently no rooms  available.  I have strongly encouraged her to reach out to the hotel or any potential places of residence she can secure.  She states she is called multiple assisted living facilities but they either are too expensive or do not have availability.  Attempt to place the plate patient in a group home have been unsuccessful.  She currently has no requirements necessitating skilled nursing facility placement in any case this would only be a temporary solution.  Case management thus far has been unable to secure a disposition.  We will keep the patient in the hospital for today considering lack of safe discharge plan I have discussed with the patient and the bedside nurse the need to find the patient a place to stay.  We will attempt to discharge patient on 01/14/2020.  Consultants:   None  Procedures:   None  Antimicrobials:   None   Subjective: Patient seen and examined.  Continues to endorse poor sleep and back pain.  Objective: Vitals:   01/10/20 1529 01/11/20 0747 01/12/20 0947 01/13/20 0710  BP:   (!) 115/41 (!) 151/80  Pulse: 77 79 86 71  Resp: 18 17 18 15   Temp:  98.4 F (36.9 C) 98.4 F (36.9 C) 98.2 F (36.8 C)  TempSrc:   Oral Oral  SpO2: 91% 97% 94% 99%  Weight:      Height:        Intake/Output Summary (Last 24 hours) at 01/13/2020 1054 Last data filed at 01/13/2020 0245 Gross per 24 hour  Intake 360 ml  Output 0 ml  Net 360 ml   Filed Weights   01/03/20 1956 01/10/20 0618  Weight: 99.8 kg 115.1 kg    Examination:  General exam: No acute distress.  Appears frail and chronically ill Respiratory system: Mild bibasilar crackles.  Normal work of breathing.  2 L  cardiovascular system: S1-S2 heard, regular rate and rhythm, no murmurs Gastrointestinal system: Obese, nontender, nondistended, normal bowel sounds  Central nervous system: Alert and oriented. No focal neurological deficits. Extremities: Decreased power bilateral lower extremities Skin: No rashes,  lesions or ulcers Psychiatry: Judgement and insight appear impaired. Mood & affect appropriate.     Data Reviewed: I have personally reviewed following labs and imaging studies  CBC: No results for input(s): WBC, NEUTROABS, HGB, HCT, MCV, PLT in the last 168 hours. Basic Metabolic Panel: Recent Labs  Lab 01/07/20 0912 01/08/20 0828  NA 140 139  K 4.3 4.8  CL 101 98  CO2 30 32  GLUCOSE 220* 154*  BUN 30* 27*  CREATININE 1.33* 1.29*  CALCIUM 8.1* 8.5*   GFR: Estimated Creatinine Clearance: 45.6 mL/min (A) (by C-G formula based on SCr of 1.29 mg/dL (H)). Liver Function Tests: No results for input(s): AST, ALT, ALKPHOS, BILITOT, PROT, ALBUMIN in the last 168 hours. No results for input(s): LIPASE, AMYLASE in the last 168 hours. No results for input(s): AMMONIA in the last 168 hours. Coagulation Profile: No results for input(s): INR, PROTIME in the last 168 hours. Cardiac Enzymes: No results for input(s): CKTOTAL, CKMB, CKMBINDEX, TROPONINI in the  last 168 hours. BNP (last 3 results) No results for input(s): PROBNP in the last 8760 hours. HbA1C: No results for input(s): HGBA1C in the last 72 hours. CBG: Recent Labs  Lab 01/10/20 2021 01/11/20 0748 01/11/20 1156 01/11/20 2002 01/13/20 0711  GLUCAP 93 139* 196* 164* 170*   Lipid Profile: No results for input(s): CHOL, HDL, LDLCALC, TRIG, CHOLHDL, LDLDIRECT in the last 72 hours. Thyroid Function Tests: No results for input(s): TSH, T4TOTAL, FREET4, T3FREE, THYROIDAB in the last 72 hours. Anemia Panel: No results for input(s): VITAMINB12, FOLATE, FERRITIN, TIBC, IRON, RETICCTPCT in the last 72 hours. Sepsis Labs: No results for input(s): PROCALCITON, LATICACIDVEN in the last 168 hours.  Recent Results (from the past 240 hour(s))  Respiratory Panel by RT PCR (Flu A&B, Covid) - Nasopharyngeal Swab     Status: None   Collection Time: 01/05/20  4:17 AM   Specimen: Nasopharyngeal Swab  Result Value Ref Range Status    SARS Coronavirus 2 by RT PCR NEGATIVE NEGATIVE Final    Comment: (NOTE) SARS-CoV-2 target nucleic acids are NOT DETECTED.  The SARS-CoV-2 RNA is generally detectable in upper respiratoy specimens during the acute phase of infection. The lowest concentration of SARS-CoV-2 viral copies this assay can detect is 131 copies/mL. A negative result does not preclude SARS-Cov-2 infection and should not be used as the sole basis for treatment or other patient management decisions. A negative result may occur with  improper specimen collection/handling, submission of specimen other than nasopharyngeal swab, presence of viral mutation(s) within the areas targeted by this assay, and inadequate number of viral copies (<131 copies/mL). A negative result must be combined with clinical observations, patient history, and epidemiological information. The expected result is Negative.  Fact Sheet for Patients:  PinkCheek.be  Fact Sheet for Healthcare Providers:  GravelBags.it  This test is no t yet approved or cleared by the [place] FDA and  has been authorized for detection and/or diagnosis of SARS-CoV-2 by FDA under an Emergency Use Authorization (EUA). This EUA will remain  in effect (meaning this test can be used) for the duration of the COVID-19 declaration under Section 564(b)(1) of the Act, 21 U.S.C. section 360bbb-3(b)(1), unless the authorization is terminated or revoked sooner.     Influenza A by PCR NEGATIVE NEGATIVE Final   Influenza B by PCR NEGATIVE NEGATIVE Final    Comment: (NOTE) The Xpert Xpress SARS-CoV-2/FLU/RSV assay is intended as an aid in  the diagnosis of influenza from Nasopharyngeal swab specimens and  should not be used as a sole basis for treatment. Nasal washings and  aspirates are unacceptable for Xpert Xpress SARS-CoV-2/FLU/RSV  testing.  Fact Sheet for  Patients: PinkCheek.be  Fact Sheet for Healthcare Providers: GravelBags.it  This test is not yet approved or cleared by the [place] FDA and  has been authorized for detection and/or diagnosis of SARS-CoV-2 by  FDA under an Emergency Use Authorization (EUA). This EUA will remain  in effect (meaning this test can be used) for the duration of the  Covid-19 declaration under Section 564(b)(1) of the Act, 21  U.S.C. section 360bbb-3(b)(1), unless the authorization is  terminated or revoked. Performed at [facility], [address], [place], [place] [address]          Radiology Studies: No results found.      Scheduled Meds: . ALPRAZolam  0.25 mg Oral QHS  . aspirin EC  81 mg Oral Daily  . colchicine  0.6 mg Oral Daily  . docusate  sodium  100 mg Oral BID  . ferrous sulfate  325 mg Oral Daily  . fluticasone  2 spray Each Nare Daily  . furosemide  40 mg Oral q AM   And  . furosemide  40 mg Oral Q1500  . Gerhardt's butt cream   Topical Daily  . hydrocortisone   Rectal BID  . insulin aspart  0-15 Units Subcutaneous TID WC  . insulin aspart  0-5 Units Subcutaneous QHS  . insulin aspart  2 Units Subcutaneous TID WC  . insulin glargine  8 Units Subcutaneous QHS  . lactulose  10 g Oral Daily  . lidocaine  1 patch Transdermal Q24H  . loratadine  10 mg Oral Daily  . metoprolol tartrate  12.5 mg Oral BID  . mirtazapine  15 mg Oral QHS  . nystatin   Topical BID  . potassium chloride  10 mEq Oral Daily  . QUEtiapine  12.5 mg Oral QHS  . senna-docusate  1 tablet Oral Daily  . sodium chloride flush  3 mL Intravenous Q12H  . vitamin B-12  1,000 mcg Oral Daily   Continuous Infusions: . sodium chloride       LOS: 9 days    Time spent: 15 minutes    Sidney Ace, MD [facility] Pager [phone]  If 7PM-7AM, please contact night-coverage 01/13/2020, 10:54 AM

## 2020-01-13 NOTE — Progress Notes (Signed)
Later in the shift, patient agreed to allow RN to remove Lidocaine patch. Please note that patient did not allow RN to fully assess her. Nurse only able to partial assessment.

## 2020-01-14 DIAGNOSIS — L03115 Cellulitis of right lower limb: Secondary | ICD-10-CM | POA: Diagnosis not present

## 2020-01-14 LAB — GLUCOSE, CAPILLARY
Glucose-Capillary: 149 mg/dL — ABNORMAL HIGH (ref 70–99)
Glucose-Capillary: 218 mg/dL — ABNORMAL HIGH (ref 70–99)
Glucose-Capillary: 195 mg/dL — ABNORMAL HIGH (ref 70–99)
Glucose-Capillary: 156 mg/dL — ABNORMAL HIGH (ref 70–99)

## 2020-01-14 MED ORDER — LOPERAMIDE HCL 2 MG PO CAPS
2.0000 mg | ORAL_CAPSULE | ORAL | Status: DC | PRN
  Administered 2020-01-14: 2 mg via ORAL
  Filled 2020-01-14 (×2): qty 1

## 2020-01-14 NOTE — Progress Notes (Signed)
Received report from [place], Therapist, sports. Assuming care of patient at this time.

## 2020-01-14 NOTE — Progress Notes (Signed)
Patient refuses all medications except prn sleep aid and lantus.

## 2020-01-14 NOTE — Progress Notes (Signed)
PROGRESS NOTE    Karla Sanchez  PYP:[id number] DOB: 1941-03-01 DOA: 01/04/2020 PCP: Alfredia Client, MD  Brief Narrative:   79 y.o. 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 Patient was recently discharged from [facility] after treatment for bilateral lower extremity cellulitis as well as right labial cellulitis. Patient also complains of a rash over her pannus as well as under her breast for which she has been applying Desitin without any significant improvement.  Patient seen and examined.  Shortness of breath improved.  On home rate of oxygen.  Patient is at a complex disposition.  She does not have any skilled needs however at this point considering her advanced age, higher level debility and her homelessness we do not have a safe disposition plan.  Case management is working on evaluations from local group homes.  10/22: Patient seen and examined this morning.  Has been sleeping, refusing turns and vitals and nursing care.  Myself and TOC representative Vernon had a long conversation with patient at bedside.  Unfortunately the patient has been refused from a group home who came to evaluate yesterday.  She currently does not have a stable living situation and endorses that she has been trying to find one unsuccessfully.  She is complaining of poor sleep and lower back pain.   At this time we have not yet secured a safe discharge plan.  Patient does not have a stable living situation and with multiple medical comorbidities at this time discharge would not be a safe option.   Assessment & Plan:   Principal Problem:   Cellulitis of right leg Active Problems:   COPD exacerbation (HCC)   Chronic diastolic CHF  (congestive heart failure) (HCC)   Type 2 diabetes mellitus with stage 3 chronic kidney disease (HCC)   Chronic kidney disease, stage 3b (HCC)   Chronic respiratory failure with hypoxia (HCC)   Obesity (BMI 30-39.9)   Fungal skin infection   Weakness   Short-term memory loss   B12 deficiency  Fungal infection on buttock and skin folds with erythema Patient response to Desitin has been limited Plan: Gerhardt Butt cream Continue antifungal nystatin powder Ensure frequent turns and cleaning  Right lower extremity cellulitis Finished course of p.o. Keflex No further systemic antibiotics  Shortness of breath Back on home rate of oxygen Shortness of breath improved PO lasix per home dose  Chronic diastolic congestive heart failure P.o. Lasix as above Metoprolol  Type 2 diabetes mellitus with chronic kidney disease stage III POA Continue basal bolus insulin regimen Monitor creatinine  Obesity BMI 35.5  B12 deficiency Replaced IM n.p.o.  COPD Chronic hypoxic respiratory failure Not acutely exacerbated Continue supplemental oxygen As needed bronchodilators  Gout Colchicine  Weakness Functional decline Patient is able to work physical therapy.  Is at her baseline level of mobility.  Recommend home health.  Patient has an unstable living situation at the moment.  Disposition is an issue    DVT prophylaxis: Lovenox Code Status: Full Family Communication: None today Disposition Plan:Status is: Inpatient  Remains inpatient appropriate because:Unsafe d/c plan   Dispo: The patient is from: Home              Anticipated d/c is to: Home  Anticipated d/c date is: 1 day              Patient currently is medically stable to d/c.Marland Kitchen  Patient is currently medically stable for discharge from the hospital.  However we have not secured a safe discharge plan.  Patient currently living out of her [name].  She was previously living at a hotel however per her report there  are currently no rooms available.  I have strongly encouraged her to reach out to the hotel or any potential places of residence she can secure.  She states she is called multiple assisted living facilities but they either are too expensive or do not have availability.  Attempt to place the plate patient in a group home have been unsuccessful.  She currently has no requirements necessitating skilled nursing facility placement in any case this would only be a temporary solution.  Unfortunately we still do not have a safe disposition.  I do not feel that discharging the patient to live in her [name] would be safe as the patient is elderly, debilitated, multiple comorbidities, requiring baseline supplemental oxygen.  She states that she is able to stay till tomorrow she will be able to secure a more stable living situation.  I have agreed to this.  Plan to discharge on 01/15/2020.   Consultants:   None  Procedures:   None  Antimicrobials:   None   Subjective: Patient seen and examined.  Continues to endorse poor sleep and back pain.  Objective: Vitals:   01/14/20 0658 01/14/20 0759 01/14/20 1150 01/14/20 1152  BP:  (!) 171/47 (!) 144/50 (!) 144/50  Pulse:  64  60  Resp:  16    Temp:  98.4 F (36.9 C) 99 F (37.2 C)   TempSrc:  Oral Oral   SpO2:  97%  96%  Weight: 114.4 kg     Height:        Intake/Output Summary (Last 24 hours) at 01/14/2020 1413 Last data filed at 01/14/2020 0920 Gross per 24 hour  Intake 3 ml  Output 300 ml  Net -297 ml   Filed Weights   01/03/20 1956 01/10/20 0618 01/14/20 0658  Weight: 99.8 kg 115.1 kg 114.4 kg    Examination:  General exam: No acute distress.  Appears frail and chronically ill Respiratory system: Mild bibasilar crackles.  Normal work of breathing.  2 L  cardiovascular system: S1-S2 heard, regular rate and rhythm, no murmurs Gastrointestinal system: Obese, nontender, nondistended, normal bowel sounds  Central nervous system: Alert  and oriented. No focal neurological deficits. Extremities: Decreased power bilateral lower extremities Skin: No rashes, lesions or ulcers Psychiatry: Judgement and insight appear impaired. Mood & affect appropriate.     Data Reviewed: I have personally reviewed following labs and imaging studies  CBC: No results for input(s): WBC, NEUTROABS, HGB, HCT, MCV, PLT in the last 168 hours. Basic Metabolic Panel: Recent Labs  Lab 01/08/20 0828  NA 139  K 4.8  CL 98  CO2 32  GLUCOSE 154*  BUN 27*  CREATININE 1.29*  CALCIUM 8.5*   GFR: Estimated Creatinine Clearance: 45.4 mL/min (A) (by C-G formula based on SCr of 1.29 mg/dL (H)). Liver Function Tests: No results for input(s): AST, ALT, ALKPHOS, BILITOT, PROT, ALBUMIN in the last 168 hours. No results for input(s): LIPASE, AMYLASE in the last 168 hours. No results for input(s): AMMONIA in the last 168 hours. Coagulation Profile: No results for input(s): INR, PROTIME in the last 168 hours. Cardiac Enzymes:  No results for input(s): CKTOTAL, CKMB, CKMBINDEX, TROPONINI in the last 168 hours. BNP (last 3 results) No results for input(s): PROBNP in the last 8760 hours. HbA1C: No results for input(s): HGBA1C in the last 72 hours. CBG: Recent Labs  Lab 01/13/20 0711 01/13/20 1642 01/13/20 2101 01/14/20 0758 01/14/20 1153  GLUCAP 170* 199* 144* 149* 218*   Lipid Profile: No results for input(s): CHOL, HDL, LDLCALC, TRIG, CHOLHDL, LDLDIRECT in the last 72 hours. Thyroid Function Tests: No results for input(s): TSH, T4TOTAL, FREET4, T3FREE, THYROIDAB in the last 72 hours. Anemia Panel: No results for input(s): VITAMINB12, FOLATE, FERRITIN, TIBC, IRON, RETICCTPCT in the last 72 hours. Sepsis Labs: No results for input(s): PROCALCITON, LATICACIDVEN in the last 168 hours.  Recent Results (from the past 240 hour(s))  Respiratory Panel by RT PCR (Flu A&B, Covid) - Nasopharyngeal Swab     Status: None   Collection Time: 01/05/20  4:17  AM   Specimen: Nasopharyngeal Swab  Result Value Ref Range Status   SARS Coronavirus 2 by RT PCR NEGATIVE NEGATIVE Final    Comment: (NOTE) SARS-CoV-2 target nucleic acids are NOT DETECTED.  The SARS-CoV-2 RNA is generally detectable in upper respiratoy specimens during the acute phase of infection. The lowest concentration of SARS-CoV-2 viral copies this assay can detect is 131 copies/mL. A negative result does not preclude SARS-Cov-2 infection and should not be used as the sole basis for treatment or other patient management decisions. A negative result may occur with  improper specimen collection/handling, submission of specimen other than nasopharyngeal swab, presence of viral mutation(s) within the areas targeted by this assay, and inadequate number of viral copies (<131 copies/mL). A negative result must be combined with clinical observations, patient history, and epidemiological information. The expected result is Negative.  Fact Sheet for Patients:  PinkCheek.be  Fact Sheet for Healthcare Providers:  GravelBags.it  This test is no t yet approved or cleared by the [place] FDA and  has been authorized for detection and/or diagnosis of SARS-CoV-2 by FDA under an Emergency Use Authorization (EUA). This EUA will remain  in effect (meaning this test can be used) for the duration of the COVID-19 declaration under Section 564(b)(1) of the Act, 21 U.S.C. section 360bbb-3(b)(1), unless the authorization is terminated or revoked sooner.     Influenza A by PCR NEGATIVE NEGATIVE Final   Influenza B by PCR NEGATIVE NEGATIVE Final    Comment: (NOTE) The Xpert Xpress SARS-CoV-2/FLU/RSV assay is intended as an aid in  the diagnosis of influenza from Nasopharyngeal swab specimens and  should not be used as a sole basis for treatment. Nasal washings and  aspirates are unacceptable for Xpert Xpress SARS-CoV-2/FLU/RSV    testing.  Fact Sheet for Patients: PinkCheek.be  Fact Sheet for Healthcare Providers: GravelBags.it  This test is not yet approved or cleared by the [place] FDA and  has been authorized for detection and/or diagnosis of SARS-CoV-2 by  FDA under an Emergency Use Authorization (EUA). This EUA will remain  in effect (meaning this test can be used) for the duration of the  Covid-19 declaration under Section 564(b)(1) of the Act, 21  U.S.C. section 360bbb-3(b)(1), unless the authorization is  terminated or revoked. Performed at [facility], [address] [place], [place] [address]          Radiology Studies: No results found.      Scheduled Meds: . aspirin EC  81 mg Oral Daily  . colchicine  0.6 mg Oral Daily  .  docusate sodium  100 mg Oral BID  . ferrous sulfate  325 mg Oral Daily  . fluticasone  2 spray Each Nare Daily  . furosemide  40 mg Oral q AM   And  . furosemide  40 mg Oral Q1500  . Gerhardt's butt cream   Topical Daily  . hydrocortisone   Rectal BID  . insulin aspart  0-15 Units Subcutaneous TID WC  . insulin aspart  0-5 Units Subcutaneous QHS  . insulin aspart  2 Units Subcutaneous TID WC  . insulin glargine  8 Units Subcutaneous QHS  . lactulose  10 g Oral Daily  . lidocaine  1 patch Transdermal Q24H  . loratadine  10 mg Oral Daily  . metoprolol tartrate  12.5 mg Oral BID  . mirtazapine  15 mg Oral QHS  . nystatin   Topical BID  . potassium chloride  10 mEq Oral Daily  . QUEtiapine  12.5 mg Oral QHS  . senna-docusate  1 tablet Oral Daily  . sodium chloride flush  3 mL Intravenous Q12H  . vitamin B-12  1,000 mcg Oral Daily   Continuous Infusions: . sodium chloride       LOS: 10 days    Time spent: 15 minutes    Sidney Ace, MD [facility] Pager [phone]  If 7PM-7AM, please contact night-coverage 01/14/2020, 2:13 PM

## 2020-01-15 DIAGNOSIS — L03115 Cellulitis of right lower limb: Secondary | ICD-10-CM | POA: Diagnosis not present

## 2020-01-15 LAB — GLUCOSE, CAPILLARY: Glucose-Capillary: 217 mg/dL — ABNORMAL HIGH (ref 70–99)

## 2020-01-15 MED ORDER — LIDOCAINE 5 % EX PTCH: 1.0000 | MEDICATED_PATCH | CUTANEOUS | 0 refills | Status: DC

## 2020-01-15 NOTE — Discharge Summary (Signed)
Physician Discharge Summary  Karla Sanchez FKC:[id number] DOB: 1940/06/08 DOA: 01/04/2020  PCP: Alfredia Client, MD  Admit date: 01/04/2020 Discharge date: 01/15/2020  Admitted From: Home Disposition:  Home  Recommendations for Outpatient Follow-up:  1. Follow up with PCP in 1-2 weeks 2.   Home Health:No Equipment/Devices:None Discharge Condition:Stable CODE STATUS:Full Diet recommendation: Heart Healthy / Carb Modified Brief/Interim Summary: 79 y.o.femalewith medical history significant forCOPD with chronic respiratory failure on 2 L of oxygen, chronic diastolic dysfunction CHF, diabetes mellitus with complications of diabetic neuropathy, hypertension who was brought into the emergency room for evaluation of right lower extremity swelling and redness. Patient was recently discharged from [facility] after treatment for bilateral lower extremity cellulitis as well as right labial cellulitis. Patient also complains of a rash over her pannus as well as under her breast for which she has been applying Desitin without any significant improvement.  Patient seen and examined.  Shortness of breath improved.  On home rate of oxygen.  Patient is at a complex disposition.  She does not have any skilled needs however at this point considering her advanced age, higher level debility and her homelessness we do not have a safe disposition plan.  Case management is working on evaluations from local group homes.  10/22: Patient seen and examined this morning.  Has been sleeping, refusing turns and vitals and nursing care.  Myself and TOC representative [place] had a long conversation with patient at bedside.  Unfortunately the patient has been refused from a group home who came to evaluate yesterday.  She currently does not have a stable living situation and endorses that she has been trying to find one unsuccessfully.  Patient was able to secure a place of residence at [facility] where she has been staying for several months at a time.  She is discharged home in stable condition.  Given her advanced age, unstable living situation, multiple medical comorbidities patient remains at high risk for readmission.  Discharge Diagnoses:  Principal Problem:   Cellulitis of right leg Active Problems:   COPD exacerbation (HCC)   Chronic diastolic CHF (congestive heart failure) (HCC)   Type 2 diabetes mellitus with stage 3 chronic kidney disease (HCC)   Chronic kidney disease, stage 3b (HCC)   Chronic respiratory failure with hypoxia (HCC)   Obesity (BMI 30-39.9)   Fungal skin infection   Weakness   Short-term memory loss   B12 deficiency  Fungal infection on buttock and skin folds with erythema Can continue OTC Desitin on discharge Outpatient PCP follow-up  Right lower extremity cellulitis Finished course of p.o. Keflex No further systemic antibiotics  Shortness of breath Back on home rate of oxygen Shortness of breath improved PO lasix per home dose  Chronic diastolic congestive heart failure P.o. Lasix as above Metoprolol  Type 2 diabetes mellitus with chronic kidney disease stage III POA Can resume home regimen on discharge  Obesity BMI 35.5  B12 deficiency Replaced  COPD Chronic hypoxic respiratory failure Not acutely exacerbated Continue supplemental oxygen As needed bronchodilators Resume home regimen on discharge  Gout Colchicine  Weakness Functional decline Patient is able to work physical therapy.  Is at her baseline level of mobility.  Recommend home health.  Patient has an unstable living situation at the moment.  We are unable to set up home health   Discharge Instructions  Discharge Instructions    Diet - low sodium heart healthy   Complete by: As directed    Increase activity slowly  Complete by: As directed      Allergies as of 01/15/2020      Reactions   Codeine Nausea Only, Other (See Comments), Swelling    Other Reaction: nausea and vomiting Other reaction(s): NAUSEA   Other Other (See Comments)   Other reaction(s): SWELLING Other reaction(s): MUSCLE PAIN Muscle aches Other reaction(s): MUSCLE PAIN Muscle aches Other reaction(s): MUSCLE PAIN Muscle aches   Exenatide Nausea And Vomiting   Levofloxacin Other (See Comments)   Other Reaction: sloughing of buccal mucosa Other reaction(s): OTHER Lost skin in mouth Patient states she can take Cipro without difficulty and has taken it many times in the past Other Reaction: sloughing of buccal mucosa   Losartan Other (See Comments)   Weight gain Weight gain   Tetracyclines & Related Other (See Comments)      Medication List    STOP taking these medications   allopurinol 100 MG tablet Commonly known as: ZYLOPRIM   benzonatate 200 MG capsule Commonly known as: TESSALON   cholestyramine 4 g packet Commonly known as: QUESTRAN   guaiFENesin 100 MG/5ML Soln Commonly known as: ROBITUSSIN   insulin glargine 100 UNIT/ML injection Commonly known as: LANTUS Replaced by: insulin glargine 100 UNIT/ML Solostar Pen   insulin lispro 100 UNIT/ML injection Commonly known as: HUMALOG Replaced by: insulin lispro 100 UNIT/ML KwikPen   polyethylene glycol 17 g packet Commonly known as: MIRALAX / GLYCOLAX   zolpidem 5 MG tablet Commonly known as: AMBIEN     TAKE these medications   acetaminophen 325 MG tablet Commonly known as: TYLENOL Take 650 mg by mouth every 6 (six) hours as needed for mild pain or fever.   albuterol 108 (90 Base) MCG/ACT inhaler Commonly known as: VENTOLIN HFA Inhale 2 puffs into the lungs every 6 (six) hours as needed for wheezing or shortness of breath.   aspirin EC 81 MG tablet Take 1 tablet (81 mg total) by mouth daily. Swallow whole.   colchicine 0.6 MG tablet Take 0.6 mg by mouth daily as needed (acute gout flare).   cyanocobalamin 1000 MCG tablet Take 1 tablet (1,000 mcg total) by mouth daily.    docusate sodium 100 MG capsule Commonly known as: COLACE Take 1 capsule (100 mg total) by mouth at bedtime.   ferrous sulfate 325 (65 FE) MG tablet Take 1 tablet (325 mg total) by mouth daily.   furosemide 40 MG tablet Commonly known as: LASIX Take 1 tablet (40 mg total) by mouth 2 (two) times daily. What changed: how much to take   hydrocortisone 2.5 % rectal cream Commonly known as: ANUSOL-HC Place rectally 2 (two) times daily.   insulin glargine 100 UNIT/ML Solostar Pen Commonly known as: LANTUS Inject 10 Units into the skin daily. Replaces: insulin glargine 100 UNIT/ML injection   insulin lispro 100 UNIT/ML KwikPen Commonly known as: HUMALOG Inject 3 Units into the skin 3 (three) times daily before meals. Replaces: insulin lispro 100 UNIT/ML injection   Insulin Pen Needle 31G X 8 MM Misc 1 Dose by Does not apply route 3 (three) times daily before meals.   ipratropium-albuterol 0.5-2.5 (3) MG/3ML Soln Commonly known as: DUONEB Take 3 mLs by nebulization every 6 (six) hours as needed.   lactulose 10 GM/15ML solution Commonly known as: CHRONULAC Take 15 mLs (10 g total) by mouth daily.   lidocaine 5 % Commonly known as: LIDODERM Place 1 patch onto the skin daily. Remove & Discard patch within 12 hours or as directed by MD   liver  oil-zinc oxide 40 % ointment Commonly known as: DESITIN Apply 1 application topically in the morning and at bedtime. Uses on buttock What changed:   when to take this  reasons to take this   metoprolol tartrate 25 MG tablet Commonly known as: LOPRESSOR Take 0.5 tablets (12.5 mg total) by mouth 2 (two) times daily. What changed:   medication strength  how much to take   mirtazapine 15 MG tablet Commonly known as: REMERON Take 1 tablet (15 mg total) by mouth at bedtime.   nystatin powder Commonly known as: MYCOSTATIN/NYSTOP Apply topically 2 (two) times daily.   potassium chloride 10 MEQ tablet Commonly known as:  KLOR-CON Take 1 tablet (10 mEq total) by mouth daily.   senna-docusate 8.6-50 MG tablet Commonly known as: Senokot-S Take 1 tablet by mouth daily.   traZODone 50 MG tablet Commonly known as: DESYREL Take 1 tablet (50 mg total) by mouth at bedtime as needed for sleep.     ASK your doctor about these medications   cephALEXin 500 MG capsule Commonly known as: KEFLEX Take 1 capsule (500 mg total) by mouth 3 (three) times daily for 4 days. Ask about: Should I take this medication?       Follow-up Information    Alfredia Client, MD In 5 days.   Specialty: Family Medicine Why: Physician is not longer w/ Practice;  Patient to contact their PCP for Follow-Up.  Contact information: [address] [place] [address] [phone]              Allergies  Allergen Reactions  . Codeine Nausea Only, Other (See Comments) and Swelling    Other Reaction: nausea and vomiting Other reaction(s): NAUSEA   . Other Other (See Comments)    Other reaction(s): SWELLING  Other reaction(s): MUSCLE PAIN Muscle aches Other reaction(s): MUSCLE PAIN Muscle aches Other reaction(s): MUSCLE PAIN Muscle aches   . Exenatide Nausea And Vomiting  . Levofloxacin Other (See Comments)    Other Reaction: sloughing of buccal mucosa Other reaction(s): OTHER Lost skin in mouth Patient states she can take Cipro without difficulty and has taken it many times in the past Other Reaction: sloughing of buccal mucosa   . Losartan Other (See Comments)    Weight gain Weight gain   . Tetracyclines & Related Other (See Comments)    Consultations:  None   Procedures/Studies: DG Chest 2 View  Result Date: 01/03/2020 CLINICAL DATA:  4 66-year-old female with shortness of breath. EXAM: CHEST - 2 VIEW COMPARISON:  Chest radiograph dated 12/08/2019. FINDINGS: Minimal left lung base atelectasis/scarring. Blunting of the left costophrenic angle may be related to  scarring. Trace left pleural effusion is not excluded clinical correlation is recommended. The right lung is clear. No pneumothorax. The cardiac silhouette is within limits. Atherosclerotic calcification of the aorta. Degenerative changes of the spine. No acute osseous pathology. IMPRESSION: Minimal left lung base atelectasis/scarring. Electronically Signed   By: Anner Crete M.D.   On: 01/03/2020 20:22   CT ABDOMEN PELVIS W CONTRAST  Result Date: 01/04/2020 CLINICAL DATA:  Abdominal pain. Fever. Perineal cellulitis. Question labial abscess. EXAM: CT ABDOMEN AND PELVIS WITH CONTRAST TECHNIQUE: Multidetector CT imaging of the abdomen and pelvis was performed using the standard protocol following bolus administration of intravenous contrast. CONTRAST:  132mL OMNIPAQUE IOHEXOL 300 MG/ML  SOLN COMPARISON:  None. FINDINGS: Lower chest: Mild dependent atelectasis is present. The heart is enlarged. Coronary artery calcifications are present. No significant pericardial effusion  is present. Hepatobiliary: No focal liver abnormality is seen. No gallstones, gallbladder wall thickening, or biliary dilatation. Pancreas: Unremarkable. No pancreatic ductal dilatation or surrounding inflammatory changes. Spleen: Water density the lesion in the anterior aspect of the spleen measures 5.7 cm compatible with a simple cyst. No other focal lesions are present. Adrenals/Urinary Tract: Adrenal glands are within normal limits bilaterally. Water density exophytic cyst is present posteriorly at the upper pole of the left kidney. Lesion measures 2.0 cm. 1.7 cm simple cyst is present at the lower pole of the left kidney. Lobulations are noted in both kidneys without other discrete mass. No stone or obstruction is present. The ureters are within normal limits bilaterally. The urinary bladder is normal. Stomach/Bowel: Stomach is within normal limits. Duodenal diverticulum is present without inflammatory changes. Small bowel is otherwise  unremarkable. Partial right colectomy noted. Anastomosis is intact. Transverse and descending colon are within normal limits. Multiple loops of small bowel extend into a ventral paraumbilical hernia. No obstruction is present. Vascular/Lymphatic: Atherosclerotic changes are present in the aorta and branch vessels without aneurysm. No significant adenopathy is present. Reproductive: Uterus unremarkable. 3.5 cm left adnexal cyst is noted. Adnexa are otherwise within normal limits. Other: Skin thickening inflammatory changes are present in the inferior pannus. Ventral hernia noted as above. No significant free fluid or free air is present. Skin thickening is present along the perineum extending to the labia. No abscess is present. Musculoskeletal: L1 inferior endplate compression fracture appears remote. Vertebral body heights are otherwise maintained. Anterior osteophytes are fused in the thoracic spine. No focal lytic or blastic lesions are present. Degenerative changes are present at the SI joints bilaterally. Bony pelvis is otherwise normal. Degenerative changes are present in the hips. Hips are located. No acute abnormalities are present. IMPRESSION: 1. Skin thickening and inflammatory changes in the inferior pannus compatible with cellulitis. 2. Skin thickening along the perineum extending to the labia. This is consistent with clinical diagnosis of cellulitis. No deeper inflammation or abscess is present. 3. Ventral paraumbilical hernia containing loops of small bowel without obstruction. 4. Cardiomegaly without failure. 5. Coronary artery disease. 6. Benign-appearing left renal cysts. 7. L1 inferior endplate compression fracture appears remote. 8. 3.5 cm left adnexal cyst. Recommend follow-up US in 6-12 months. Note: This recommendation does not apply to premenarchal patients and to those with increased risk (genetic, family history, elevated tumor markers or other high-risk factors) of ovarian cancer.  Reference: JACR 2020 Feb; 17(2):248-254 9. Aortic Atherosclerosis (ICD10-I70.0). Electronically Signed   By: San Morelle M.D.   On: 01/04/2020 05:15   US Venous Img Lower Unilateral Right  Result Date: 01/04/2020 CLINICAL DATA:  Right leg pain and swelling EXAM: RIGHT LOWER EXTREMITY VENOUS DOPPLER ULTRASOUND TECHNIQUE: Gray-scale sonography with compression, as well as color and duplex ultrasound, were performed to evaluate the deep venous system(s) from the level of the common femoral vein through the popliteal and proximal calf veins. COMPARISON:  None. FINDINGS: VENOUS Normal compressibility of the common femoral, superficial femoral, and popliteal veins, as well as the visualized calf veins. Visualized portions of profunda femoral vein and great saphenous vein unremarkable. No filling defects to suggest DVT on grayscale or color Doppler imaging. Doppler waveforms show normal direction of venous flow, normal respiratory plasticity and response to augmentation. Limited views of the contralateral common femoral vein are unremarkable. OTHER None. Limitations: none IMPRESSION: Negative. Electronically Signed   By: Fidela Salisbury MD   On: 01/04/2020 03:03   DG Chest Port 1 [address]  Result Date: 01/10/2020 CLINICAL DATA:  Shortness of breath.  Cellulitis EXAM: PORTABLE CHEST 1 VIEW COMPARISON:  Seven days ago FINDINGS: New vascular prominence. There is likely trace pleural effusions. Hyperinflation with diaphragm flattening. Cardiomegaly IMPRESSION: 1. Interval interstitial edema/vascular congestion. 2. COPD. Electronically Signed   By: Monte Fantasia M.D.   On: 01/10/2020 10:21    (Echo, Carotid, EGD, Colonoscopy, ERCP)    Subjective: Seen and examined on the day of discharge.  Stable, no distress.  Stable for discharge   Discharge Exam: Vitals:   01/14/20 1524 01/14/20 2339  BP: (!) 157/72 (!) 172/54  Pulse: 73 70  Resp: 18 17  Temp: 98.2 F (36.8 C) 98.2 F (36.8 C)  SpO2: 94% 98%    Vitals:   01/14/20 1500 01/14/20 1524 01/14/20 2339 01/15/20 0627  BP: 93/76 (!) 157/72 (!) 172/54   Pulse: 68 73 70   Resp: 16 18 17    Temp: 98.2 F (36.8 C) 98.2 F (36.8 C) 98.2 F (36.8 C)   TempSrc: Oral Oral Oral   SpO2: 94% 94% 98%   Weight:    113.8 kg  Height:        General: Pt is alert, awake, not in acute distress Cardiovascular: RRR, S1/S2 +, no rubs, no gallops Respiratory: CTA bilaterally, no wheezing, no rhonchi Abdominal: Soft, NT, ND, bowel sounds + Extremities: no edema, no cyanosis    The results of significant diagnostics from this hospitalization (including imaging, microbiology, ancillary and laboratory) are listed below for reference.     Microbiology: No results found for this or any previous visit (from the past 240 hour(s)).   Labs: BNP (last 3 results) Recent Labs    11/19/19 2013 11/25/19 1738 12/08/19 1448  BNP 76.0 81.8 61.9   Basic Metabolic Panel: No results for input(s): NA, K, CL, CO2, GLUCOSE, BUN, CREATININE, CALCIUM, MG, PHOS in the last 168 hours. Liver Function Tests: No results for input(s): AST, ALT, ALKPHOS, BILITOT, PROT, ALBUMIN in the last 168 hours. No results for input(s): LIPASE, AMYLASE in the last 168 hours. No results for input(s): AMMONIA in the last 168 hours. CBC: No results for input(s): WBC, NEUTROABS, HGB, HCT, MCV, PLT in the last 168 hours. Cardiac Enzymes: No results for input(s): CKTOTAL, CKMB, CKMBINDEX, TROPONINI in the last 168 hours. BNP: Invalid input(s): POCBNP CBG: Recent Labs  Lab 01/13/20 2101 01/14/20 0758 01/14/20 1153 01/14/20 1701 01/14/20 2043  GLUCAP 144* 149* 218* 195* 156*   D-Dimer No results for input(s): DDIMER in the last 72 hours. Hgb A1c No results for input(s): HGBA1C in the last 72 hours. Lipid Profile No results for input(s): CHOL, HDL, LDLCALC, TRIG, CHOLHDL, LDLDIRECT in the last 72 hours. Thyroid function studies No results for input(s): TSH, T4TOTAL,  T3FREE, THYROIDAB in the last 72 hours.  Invalid input(s): FREET3 Anemia work up No results for input(s): VITAMINB12, FOLATE, FERRITIN, TIBC, IRON, RETICCTPCT in the last 72 hours. Urinalysis    Component Value Date/Time   COLORURINE STRAW (A) 07/30/2019 2204   APPEARANCEUR CLEAR (A) 07/30/2019 2204   LABSPEC 1.009 07/30/2019 2204   PHURINE 5.0 07/30/2019 [place] 07/30/2019 2204   HGBUR NEGATIVE 07/30/2019 2204   [place] 07/30/2019 2204   KETONESUR NEGATIVE 07/30/2019 2204   PROTEINUR 100 (A) 07/30/2019 2204   NITRITE NEGATIVE 07/30/2019 [place] 07/30/2019 2204   Sepsis Labs Invalid input(s): PROCALCITONIN,  WBC,  LACTICIDVEN Microbiology No results found for this or any previous visit (  from the past 240 hour(s)).   Time coordinating discharge: Over 30 minutes  SIGNED:   Sidney Ace, MD  [facility] 01/15/2020, 10:56 AM Pager   If 7PM-7AM, please contact night-coverage

## 2020-01-15 NOTE — Progress Notes (Signed)
Patient wants to take her 0700 Lasix with her morning medications.

## 2020-01-15 NOTE — Progress Notes (Signed)
This RN went in to get pts VS and BG. Pt yelling at this nurse stating "get out of here, I want to sleep". I don't want you to do anything, she also refused her am medication. MD notified.

## 2020-01-15 NOTE — Care Management Important Message (Signed)
Important Message  Patient Details  Name: Karla Sanchez MRN: [id number] Date of Birth: October 12, 1940   Medicare Important Message Given:  Yes     Juliann Pulse A Kerline Trahan 01/15/2020, 10:13 AM

## 2020-01-16 ENCOUNTER — Telehealth (HOSPITAL_COMMUNITY): Payer: Self-pay

## 2020-01-16 NOTE — Telephone Encounter (Signed)
Contacted her to set up appt since she was just discharged from [facility].  She advised when she was discharged she drove to [place] and got her a room at [facility].  She states she is staying there 3 days til she find her a place to live.  She states she knows she does not want SNFF, she states there is a room available at a high rise in [place] and if does not work out she states she will go to [facility] rescue to stay.  Wished her well and advised her to let me know if she comes back to [facility], she advises she has no plans of coming back here.   Toast [phone]

## 2020-04-25 ENCOUNTER — Encounter: Payer: Self-pay | Admitting: Emergency Medicine

## 2020-04-25 ENCOUNTER — Inpatient Hospital Stay
Admission: EM | Admit: 2020-04-25 | Discharge: 2020-04-28 | DRG: 291 | Discharge status: Against Medical Advice | Payer: Medicare Other | Attending: Internal Medicine | Admitting: Internal Medicine

## 2020-04-25 ENCOUNTER — Emergency Department: Payer: Medicare Other

## 2020-04-25 ENCOUNTER — Other Ambulatory Visit: Payer: Self-pay

## 2020-04-25 DIAGNOSIS — Z885 Allergy status to narcotic agent status: Secondary | ICD-10-CM | POA: Diagnosis not present

## 2020-04-25 DIAGNOSIS — Z85038 Personal history of other malignant neoplasm of large intestine: Secondary | ICD-10-CM | POA: Diagnosis not present

## 2020-04-25 DIAGNOSIS — J449 Chronic obstructive pulmonary disease, unspecified: Secondary | ICD-10-CM | POA: Diagnosis present

## 2020-04-25 DIAGNOSIS — Z9049 Acquired absence of other specified parts of digestive tract: Secondary | ICD-10-CM

## 2020-04-25 DIAGNOSIS — Z6835 Body mass index (BMI) 35.0-35.9, adult: Secondary | ICD-10-CM | POA: Diagnosis not present

## 2020-04-25 DIAGNOSIS — Z888 Allergy status to other drugs, medicaments and biological substances status: Secondary | ICD-10-CM | POA: Diagnosis not present

## 2020-04-25 DIAGNOSIS — T380X5A Adverse effect of glucocorticoids and synthetic analogues, initial encounter: Secondary | ICD-10-CM | POA: Diagnosis present

## 2020-04-25 DIAGNOSIS — R0602 Shortness of breath: Secondary | ICD-10-CM | POA: Diagnosis not present

## 2020-04-25 DIAGNOSIS — N1832 Chronic kidney disease, stage 3b: Secondary | ICD-10-CM | POA: Diagnosis present

## 2020-04-25 DIAGNOSIS — E1165 Type 2 diabetes mellitus with hyperglycemia: Secondary | ICD-10-CM | POA: Diagnosis present

## 2020-04-25 DIAGNOSIS — R7989 Other specified abnormal findings of blood chemistry: Secondary | ICD-10-CM | POA: Diagnosis present

## 2020-04-25 DIAGNOSIS — E669 Obesity, unspecified: Secondary | ICD-10-CM | POA: Diagnosis present

## 2020-04-25 DIAGNOSIS — R778 Other specified abnormalities of plasma proteins: Secondary | ICD-10-CM | POA: Diagnosis not present

## 2020-04-25 DIAGNOSIS — M109 Gout, unspecified: Secondary | ICD-10-CM | POA: Diagnosis present

## 2020-04-25 DIAGNOSIS — M19019 Primary osteoarthritis, unspecified shoulder: Secondary | ICD-10-CM

## 2020-04-25 DIAGNOSIS — I248 Other forms of acute ischemic heart disease: Secondary | ICD-10-CM | POA: Diagnosis present

## 2020-04-25 DIAGNOSIS — J441 Chronic obstructive pulmonary disease with (acute) exacerbation: Secondary | ICD-10-CM | POA: Diagnosis present

## 2020-04-25 DIAGNOSIS — I5033 Acute on chronic diastolic (congestive) heart failure: Principal | ICD-10-CM | POA: Diagnosis present

## 2020-04-25 DIAGNOSIS — R0601 Orthopnea: Secondary | ICD-10-CM

## 2020-04-25 DIAGNOSIS — I130 Hypertensive heart and chronic kidney disease with heart failure and stage 1 through stage 4 chronic kidney disease, or unspecified chronic kidney disease: Principal | ICD-10-CM | POA: Diagnosis present

## 2020-04-25 DIAGNOSIS — Z79899 Other long term (current) drug therapy: Secondary | ICD-10-CM | POA: Diagnosis not present

## 2020-04-25 DIAGNOSIS — E119 Type 2 diabetes mellitus without complications: Secondary | ICD-10-CM

## 2020-04-25 DIAGNOSIS — Z794 Long term (current) use of insulin: Secondary | ICD-10-CM

## 2020-04-25 DIAGNOSIS — R0902 Hypoxemia: Secondary | ICD-10-CM | POA: Diagnosis present

## 2020-04-25 DIAGNOSIS — Z20822 Contact with and (suspected) exposure to covid-19: Secondary | ICD-10-CM | POA: Diagnosis present

## 2020-04-25 LAB — COMPREHENSIVE METABOLIC PANEL
ALT: 16 U/L (ref 0–44)
AST: 25 U/L (ref 15–41)
Albumin: 3.4 g/dL — ABNORMAL LOW (ref 3.5–5.0)
Alkaline Phosphatase: 86 U/L (ref 38–126)
Anion gap: 10 (ref 5–15)
BUN: 30 mg/dL — ABNORMAL HIGH (ref 8–23)
CO2: 25 mmol/L (ref 22–32)
Calcium: 8.9 mg/dL (ref 8.9–10.3)
Chloride: 103 mmol/L (ref 98–111)
Creatinine, Ser: 1 mg/dL (ref 0.44–1.00)
GFR, Estimated: 57 mL/min — ABNORMAL LOW (ref >60)
Glucose, Bld: 165 mg/dL — ABNORMAL HIGH (ref 70–99)
Potassium: 5.6 mmol/L — ABNORMAL HIGH (ref 3.5–5.1)
Sodium: 138 mmol/L (ref 135–145)
Total Bilirubin: 0.6 mg/dL (ref 0.3–1.2)
Total Protein: 6.9 g/dL (ref 6.5–8.1)

## 2020-04-25 LAB — TROPONIN I (HIGH SENSITIVITY)
Troponin I (High Sensitivity): 29 ng/L — ABNORMAL HIGH (ref <18)
Troponin I (High Sensitivity): 30 ng/L — ABNORMAL HIGH (ref <18)

## 2020-04-25 LAB — URINALYSIS, COMPLETE (UACMP) WITH MICROSCOPIC
Bacteria, UA: NONE SEEN
Bilirubin Urine: NEGATIVE
Glucose, UA: NEGATIVE mg/dL
Ketones, ur: NEGATIVE mg/dL
Leukocytes,Ua: NEGATIVE
Nitrite: NEGATIVE
Protein, ur: >300 mg/dL — AB
Specific Gravity, Urine: 1.02 (ref 1.005–1.030)
pH: 7 (ref 5.0–8.0)

## 2020-04-25 LAB — CBC WITH DIFFERENTIAL/PLATELET
Abs Immature Granulocytes: 0.02 10*3/uL (ref 0.00–0.07)
Basophils Absolute: 0 10*3/uL (ref 0.0–0.1)
Basophils Relative: 0 %
Eosinophils Absolute: 0.3 10*3/uL (ref 0.0–0.5)
Eosinophils Relative: 4 %
HCT: 41.9 % (ref 36.0–46.0)
Hemoglobin: 12.9 g/dL (ref 12.0–15.0)
Immature Granulocytes: 0 %
Lymphocytes Relative: 34 %
Lymphs Abs: 2.6 10*3/uL (ref 0.7–4.0)
MCH: 26 pg (ref 26.0–34.0)
MCHC: 30.8 g/dL (ref 30.0–36.0)
MCV: 84.5 fL (ref 80.0–100.0)
Monocytes Absolute: 0.5 10*3/uL (ref 0.1–1.0)
Monocytes Relative: 7 %
Neutro Abs: 4.2 10*3/uL (ref 1.7–7.7)
Neutrophils Relative %: 55 %
Platelets: 288 10*3/uL (ref 150–400)
RBC: 4.96 MIL/uL (ref 3.87–5.11)
RDW: 15.2 % (ref 11.5–15.5)
WBC: 7.7 10*3/uL (ref 4.0–10.5)
nRBC: 0 % (ref 0.0–0.2)

## 2020-04-25 LAB — MAGNESIUM: Magnesium: 2.3 mg/dL (ref 1.7–2.4)

## 2020-04-25 LAB — BRAIN NATRIURETIC PEPTIDE: B Natriuretic Peptide: 169.6 pg/mL — ABNORMAL HIGH (ref 0.0–100.0)

## 2020-04-25 LAB — POC SARS CORONAVIRUS 2 AG -  ED: SARS Coronavirus 2 Ag: NEGATIVE

## 2020-04-25 LAB — CBG MONITORING, ED: Glucose-Capillary: 142 mg/dL — ABNORMAL HIGH (ref 70–99)

## 2020-04-25 MED ORDER — ALBUTEROL SULFATE HFA 108 (90 BASE) MCG/ACT IN AERS
2.0000 | INHALATION_SPRAY | Freq: Four times a day (QID) | RESPIRATORY_TRACT | Status: DC | PRN
  Filled 2020-04-25: qty 6.7

## 2020-04-25 MED ORDER — FUROSEMIDE 10 MG/ML IJ SOLN
60.0000 mg | Freq: Once | INTRAMUSCULAR | Status: AC
  Administered 2020-04-25: 60 mg via INTRAVENOUS
  Filled 2020-04-25: qty 8

## 2020-04-25 MED ORDER — VITAMIN B-12 1000 MCG PO TABS
1000.0000 ug | ORAL_TABLET | Freq: Every day | ORAL | Status: DC
  Administered 2020-04-26 – 2020-04-28 (×3): 1000 ug via ORAL
  Filled 2020-04-25 (×4): qty 1

## 2020-04-25 MED ORDER — ENOXAPARIN SODIUM 60 MG/0.6ML ~~LOC~~ SOLN
0.5000 mg/kg | SUBCUTANEOUS | Status: DC
  Administered 2020-04-26: 50 mg via SUBCUTANEOUS
  Filled 2020-04-25 (×2): qty 0.6

## 2020-04-25 MED ORDER — ASPIRIN EC 81 MG PO TBEC
81.0000 mg | DELAYED_RELEASE_TABLET | Freq: Every day | ORAL | Status: DC
Start: 2020-04-26 — End: 2020-04-28
  Administered 2020-04-26 – 2020-04-28 (×3): 81 mg via ORAL
  Filled 2020-04-25 (×3): qty 1

## 2020-04-25 MED ORDER — FUROSEMIDE 10 MG/ML IJ SOLN
40.0000 mg | Freq: Two times a day (BID) | INTRAMUSCULAR | Status: DC
  Administered 2020-04-26 – 2020-04-27 (×2): 40 mg via INTRAVENOUS
  Filled 2020-04-25 (×4): qty 4

## 2020-04-25 MED ORDER — DOCUSATE SODIUM 100 MG PO CAPS
100.0000 mg | ORAL_CAPSULE | Freq: Every day | ORAL | Status: DC
  Administered 2020-04-26 – 2020-04-28 (×3): 100 mg via ORAL
  Filled 2020-04-25 (×3): qty 1

## 2020-04-25 MED ORDER — MIRTAZAPINE 15 MG PO TABS
15.0000 mg | ORAL_TABLET | Freq: Every evening | ORAL | Status: DC | PRN
  Administered 2020-04-26: 15 mg via ORAL
  Filled 2020-04-25: qty 1

## 2020-04-25 MED ORDER — FUROSEMIDE 10 MG/ML IJ SOLN: 40.0000 mg | Freq: Two times a day (BID) | INTRAMUSCULAR | Status: DC

## 2020-04-25 MED ORDER — FERROUS SULFATE 325 (65 FE) MG PO TABS
325.0000 mg | ORAL_TABLET | Freq: Every day | ORAL | Status: DC
  Administered 2020-04-26 – 2020-04-28 (×2): 325 mg via ORAL
  Filled 2020-04-25 (×4): qty 1

## 2020-04-25 MED ORDER — ACETAMINOPHEN 325 MG PO TABS: 650.0000 mg | ORAL_TABLET | Freq: Four times a day (QID) | ORAL | Status: DC | PRN

## 2020-04-25 MED ORDER — INSULIN ASPART 100 UNIT/ML ~~LOC~~ SOLN
0.0000 [IU] | Freq: Three times a day (TID) | SUBCUTANEOUS | Status: DC
  Administered 2020-04-26: 3 [IU] via SUBCUTANEOUS
  Administered 2020-04-27: 2 [IU] via SUBCUTANEOUS
  Administered 2020-04-27: 1 [IU] via SUBCUTANEOUS
  Administered 2020-04-27: 2 [IU] via SUBCUTANEOUS
  Filled 2020-04-25 (×4): qty 1

## 2020-04-25 NOTE — ED Notes (Signed)
Extra blue and gray top sent

## 2020-04-25 NOTE — ED Notes (Signed)
States non-ambulatory ,  Uses a wheelchair at home

## 2020-04-25 NOTE — ED Triage Notes (Signed)
SOB for aprox 2-+days [place], [place], currently traveling from [place] to find a property )  , hx of chf, diabetic ( noncompliant w/meds , unable to get meds " stated ran out yesterday )

## 2020-04-25 NOTE — H&P (Signed)
History and Physical    PLEASE NOTE THAT DRAGON DICTATION SOFTWARE WAS USED IN THE CONSTRUCTION OF THIS NOTE.   Karla Sanchez WJX:[id number] DOB: 1941-03-17 DOA: 04/25/2020  PCP: Alfredia Client, MD Patient coming from: home   I have personally briefly reviewed patient's old medical records in [place]  Chief Complaint: Shortness of breath  HPI: Karla Sanchez is a 80 y.o. female with medical history significant for chronic diastolic heart failure with most recent echocardiogram in April 2021 showing LVEF 55 to 60% and grade 2 diastolic dysfunction, hypertension, type 2 diabetes mellitus who is admitted to [facility] on 04/25/2020 with acute on chronic diastolic heart failure after presenting from home  to [facility] Emergency Department complaining of shortness of breath.   The patient reports 2 to 3 days of progressive shortness of breath associated with orthopnea and possible increase in peripheral edema.  He denies any associated chest pain, diaphoresis, palpitations, nausea, vomiting, syncope, or syncope.  She reports associated nonproductive cough, but denies any hemoptysis, calf tenderness, or new lower extremity erythema.  Denies any recent trauma, travel, surgical procedures, periods of prolonged diminished ambulatory status.  No recent melena or hematochezia.  Denies any associated subjective fever, chills, rigors, or generalized myalgias.  Denies any recent headache, neck stiffness, rhinorrhea, rhinitis, sore throat, abdominal pain, diarrhea, rash.  No known recent COVID-19 exposures.  The patient acknowledges suboptimal compliance with majority of her outpatient medications over the course of the last month.  Over that time, she reports that she has missed the vast majority of her doses of medications, including that of her Lasix, Lantus, Humalog, and Lopressor.  Per chart review, most recent echocardiogram was performed in April 2021, and showed  moderately dilated left atrium, moderate LVH, LVEF 55 to 60%, no evidence of focal wall motion normalities, and showed grade 2 diastolic dysfunction.  Per chart review, it appears that she follows with [facility] cardiology as her outpatient cardiology service.  She denies any known baseline supplemental oxygen requirements.    ED Course:  Vital signs in the ED were notable for the following: Temperature 98.4; heart rate 62-69; blood pressure 174/58; respiratory rate 19-21; initial oxygen saturation noted to be 83% on room air, which subsequently improved to 98 to 100% on 3 L nasal cannula.   Labs were notable for the following: CMP was notable for the following: Sodium 138, bicarbonate 25, creatinine 1.0, glucose 165.  CBC was notable for the following: White blood cell count 7700, hemoglobin 12.9.  BNP 170, relative to most recent prior value of 158 when checked in September 2021.  High-sensitivity troponin I initially noted to be 29, with repeat value found to be 30.  Urinalysis showed no evidence of white blood cells, no bacteria, and was nitrate negative as well as leukocyte esterase negative.  Screening COVID-19 antigen performed in the ED this evening was found to be negative.  EKG, by way of comparison to most recent prior performed on 10 26,021, appears to show sinus rhythm with PACs and heart rate 60, nonspecific T wave inversion in lead III, which is unchanged from most recent prior EKG, and nonspecific T wave inversion in V4 through V6, while showing no evidence of ST changes, including no evidence of ST elevation.  Chest x-ray showed no evidence of acute cardiopulmonary process, but incidentally showed severe degenerative changes in the bilateral glenohumeral joints, with potential left-sided glenohumeral dislocation or subluxation, with associated radiology instructions to further evaluate with dedicated left  shoulder plain films.  While in the ED, the following were administered: Lasix 60  mg IV x1.  Subsequently, the patient was admitted to the med telemetry floor for further evaluation management of presenting acute hypoxic respiratory distress in the setting of acute on chronic diastolic heart failure.     Review of Systems: As per HPI otherwise 10 point review of systems negative.   Past Medical History:  Diagnosis Date  . CHF (congestive heart failure) ([place])   . Colon cancer [facility])    s/p right and left colectomy 2020  . COPD (chronic obstructive pulmonary disease) ([place])   . Diabetes mellitus without complication ([place])   . Diastolic heart failure ([place])   . Gout   . Hypertension     Past Surgical History:  Procedure Laterality Date  . COLON SURGERY    . TUBAL LIGATION  1970    Social History:  reports that she has never smoked. She has never used smokeless tobacco. She reports previous alcohol use. She reports that she does not use drugs.   Allergies  Allergen Reactions  . Codeine Nausea Only, Other (See Comments) and Swelling    Other Reaction: nausea and vomiting Other reaction(s): NAUSEA   . Other Other (See Comments)    Other reaction(s): SWELLING  Other reaction(s): MUSCLE PAIN Muscle aches Other reaction(s): MUSCLE PAIN Muscle aches Other reaction(s): MUSCLE PAIN Muscle aches   . Exenatide Nausea And Vomiting  . Levofloxacin Other (See Comments)    Other Reaction: sloughing of buccal mucosa Other reaction(s): OTHER Lost skin in mouth Patient states she can take Cipro without difficulty and has taken it many times in the past Other Reaction: sloughing of buccal mucosa   . Losartan Other (See Comments)    Weight gain Weight gain   . Tetracyclines & Related Other (See Comments)    Family History  Problem Relation Age of Onset  . Lung cancer Father      Prior to Admission medications   Medication Sig Start Date End Date Taking? Authorizing Provider  acetaminophen (TYLENOL) 325 MG tablet Take 650 mg by mouth every 6 (six) hours as  needed for mild pain or fever.     [provider]  albuterol (VENTOLIN HFA) 108 (90 Base) MCG/ACT inhaler Inhale 2 puffs into the lungs every 6 (six) hours as needed for wheezing or shortness of breath. 01/09/20   Loletha Grayer, MD  aspirin EC 81 MG tablet Take 1 tablet (81 mg total) by mouth daily. Swallow whole. 01/09/20   Loletha Grayer, MD  colchicine 0.6 MG tablet Take 0.6 mg by mouth daily as needed (acute gout flare).     [provider]  docusate sodium (COLACE) 100 MG capsule Take 1 capsule (100 mg total) by mouth at bedtime. 01/09/20   Loletha Grayer, MD  ferrous sulfate 325 (65 FE) MG tablet Take 1 tablet (325 mg total) by mouth daily. 01/09/20   Loletha Grayer, MD  furosemide (LASIX) 40 MG tablet Take 1 tablet (40 mg total) by mouth 2 (two) times daily. 01/09/20   Loletha Grayer, MD  hydrocortisone (ANUSOL-HC) 2.5 % rectal cream Place rectally 2 (two) times daily. 01/09/20   Loletha Grayer, MD  insulin glargine (LANTUS) 100 UNIT/ML Solostar Pen Inject 10 Units into the skin daily. 01/09/20   Loletha Grayer, MD  insulin lispro (HUMALOG) 100 UNIT/ML KwikPen Inject 3 Units into the skin 3 (three) times daily before meals. 01/09/20   Loletha Grayer, MD  Insulin Pen Needle 31G  X 8 MM MISC 1 Dose by Does not apply route 3 (three) times daily before meals. 01/09/20   Wieting, Richard, MD  ipratropium-albuterol (DUONEB) 0.5-2.5 (3) MG/3ML SOLN Take 3 mLs by nebulization every 6 (six) hours as needed. 01/09/20   Alford Highland, MD  lactulose (CHRONULAC) 10 GM/15ML solution Take 15 mLs (10 g total) by mouth daily. 01/09/20   Wieting, Richard, MD  lidocaine (LIDODERM) 5 % Place 1 patch onto the skin daily. Remove & Discard patch within 12 hours or as directed by MD 01/15/20   Tresa Moore, MD  liver oil-zinc oxide (DESITIN) 40 % ointment Apply 1 application topically in the morning and at bedtime. Uses on buttock 01/09/20   Alford Highland, MD  metoprolol  tartrate (LOPRESSOR) 25 MG tablet Take 0.5 tablets (12.5 mg total) by mouth 2 (two) times daily. 01/09/20   Alford Highland, MD  mirtazapine (REMERON) 15 MG tablet Take 1 tablet (15 mg total) by mouth at bedtime. 01/09/20   Alford Highland, MD  nystatin (MYCOSTATIN/NYSTOP) powder Apply topically 2 (two) times daily. 01/09/20   Alford Highland, MD  potassium chloride (KLOR-CON) 10 MEQ tablet Take 1 tablet (10 mEq total) by mouth daily. 01/09/20 01/08/21  Alford Highland, MD  senna-docusate (SENOKOT-S) 8.6-50 MG tablet Take 1 tablet by mouth daily. 01/09/20   Alford Highland, MD  traZODone (DESYREL) 50 MG tablet Take 1 tablet (50 mg total) by mouth at bedtime as needed for sleep. 01/09/20   Alford Highland, MD  vitamin B-12 1000 MCG tablet Take 1 tablet (1,000 mcg total) by mouth daily. 01/09/20   Alford Highland, MD     Objective    Physical Exam: Vitals:   04/25/20 1554 04/25/20 1615 04/25/20 1616 04/25/20 1630  BP: (!) 185/76     Pulse: 62   (!) 59  Resp: 20   20  Temp: 98.4 F (36.9 C)     TempSrc: Oral     SpO2: 95% 98%  98%  Weight:   99.8 kg   Height:   5\' 6"  (1.676 m)     General: appears to be stated age; alert, oriented; mildly increased work of breathing noted Skin: warm, dry, no rash Head:  AT/[place] Mouth:  Oral mucosa membranes appear moist, normal dentition Neck: supple; trachea midline Heart:  RRR; did not appreciate any M/R/G Lungs: CTAB, did not appreciate any wheezes, rales, or rhonchi Abdomen: + BS; soft, ND, NT Vascular: 2+ pedal pulses b/l; 2+ radial pulses b/l Extremities: Trace edema in the bilateral lower extremities, no muscle wasting Neuro: strength and sensation intact in upper and lower extremities b/l   Labs on Admission: I have personally reviewed following labs and imaging studies  CBC: Recent Labs  Lab 04/25/20 1552  WBC 7.7  NEUTROABS 4.2  HGB 12.9  HCT 41.9  MCV 84.5  PLT 288   Basic Metabolic Panel: Recent Labs  Lab  04/25/20 1552  NA 138  K 5.6*  CL 103  CO2 25  GLUCOSE 165*  BUN 30*  CREATININE 1.00  CALCIUM 8.9   GFR: Estimated Creatinine Clearance: 54.4 mL/min (by C-G formula based on SCr of 1 mg/dL). Liver Function Tests: Recent Labs  Lab 04/25/20 1552  AST 25  ALT 16  ALKPHOS 86  BILITOT 0.6  PROT 6.9  ALBUMIN 3.4*   No results for input(s): LIPASE, AMYLASE in the last 168 hours. No results for input(s): AMMONIA in the last 168 hours. Coagulation Profile: No results for input(s): INR, PROTIME in  the last 168 hours. Cardiac Enzymes: No results for input(s): CKTOTAL, CKMB, CKMBINDEX, TROPONINI in the last 168 hours. BNP (last 3 results) No results for input(s): PROBNP in the last 8760 hours. HbA1C: No results for input(s): HGBA1C in the last 72 hours. CBG: No results for input(s): GLUCAP in the last 168 hours. Lipid Profile: No results for input(s): CHOL, HDL, LDLCALC, TRIG, CHOLHDL, LDLDIRECT in the last 72 hours. Thyroid Function Tests: No results for input(s): TSH, T4TOTAL, FREET4, T3FREE, THYROIDAB in the last 72 hours. Anemia Panel: No results for input(s): VITAMINB12, FOLATE, FERRITIN, TIBC, IRON, RETICCTPCT in the last 72 hours. Urine analysis:    Component Value Date/Time   COLORURINE STRAW (A) 07/30/2019 2204   APPEARANCEUR CLEAR (A) 07/30/2019 2204   LABSPEC 1.009 07/30/2019 2204   PHURINE 5.0 07/30/2019 [place] 07/30/2019 2204   HGBUR NEGATIVE 07/30/2019 2204   [place] 07/30/2019 [place] 07/30/2019 2204   PROTEINUR 100 (A) 07/30/2019 2204   NITRITE NEGATIVE 07/30/2019 2204   LEUKOCYTESUR NEGATIVE 07/30/2019 2204    Radiological Exams on Admission: DG Chest Portable 1 View  Result Date: 04/25/2020 CLINICAL DATA:  CHF exacerbation. EXAM: PORTABLE CHEST 1 VIEW COMPARISON:  01/10/2020 FINDINGS: There are chronic changes of both lung fields bilaterally including pleuroparenchymal scarring and atelectasis. The  heart size is enlarged but stable from prior study. Aortic calcifications are noted. There is no pneumothorax. No large pleural effusion. There are end-stage degenerative changes of both glenohumeral joints. The left glenohumeral joint appears to be inferiorly subluxed and may be frankly dislocated with the humeral head perched on the glenoid. IMPRESSION: 1. COPD without evidence for an acute cardiopulmonary process. 2. 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 There are findings suspicious for a left-sided glenohumeral dislocation or subluxation. The left humeral head appears to be perched on the glenoid. Dedicated left shoulder radiographs are recommended for further evaluation of this finding. This is new since 2021. Electronically Signed   By: Constance Holster M.D.   On: 04/25/2020 16:13     EKG: Independently reviewed, with result as described above.    Assessment/Plan   Karla Sanchez is a 80 y.o. female with medical history significant for chronic diastolic heart failure with most recent echocardiogram in April 2021 showing LVEF 55 to 60% and grade 2 diastolic dysfunction, hypertension, type 2 diabetes mellitus who is admitted to [facility] on 04/25/2020 with acute on chronic diastolic heart failure after presenting from home  to [facility] Emergency Department complaining of shortness of breath.    Principal Problem:   Acute on chronic diastolic heart failure (HCC) Active Problems:   DM2 (diabetes mellitus, type 2) (HCC)   COPD (chronic obstructive pulmonary disease) (HCC)   SOB (shortness of breath)   Elevated troponin    #) Acute on chronic diastolic heart failure: Diagnosis on reasons of presenting to 3 days of progressive shortness of breath associated with orthopnea, presenting BNP is 3 times the most recent prior finding, all in the context of patient's report of suboptimal compliance over the course of the last month with her outpatient  medication regimen, during which she reports missing the vast majority of her doses of Lasix.  This is in the setting of a known history of chronic diastolic heart failure, with most recent echocardiogram in April 2021 showing LVEF 55 to 60% and grade 2 diastolic dysfunction, with additional details stemming from this previous echocardiogram, as listed above.  EKG does not  appear to show evidence of acute ischemic changes.  Clinically, acute PE is felt to be less likely at the present time.  No evidence of pneumothorax on presenting chest x-ray.  Patient's home diuretic regimen is prescribed to be Lasix 40 mg p.o. twice daily, with the majority of the doses of such missed over the last month, as above.  Note, the patient received Lasix 60 mg IV x1 in the ED this evening.  She is prescribed Lopressor as an outpatient, and will existing beta-blockers are typically resumed in the setting of acutely decompensated heart failure, I feel that her suboptimal compliance with her home medications, including her beta-blocker, is sufficiently significant that I will initially hold her home Lopressor so as to prevent further exacerbation from potential loss of relative compensatory tachycardia in the setting of acute on chronic diastolic heart failure.     Plan: monitor strict I's & O's and daily weights. Monitor on telemetry, including trend in HR in response to diuresis, as above. Monitor continuous pulse oximetry. Repeat BMP in the morning, including for monitoring of trend of potassium, bicarbonate, and renal function in response to interval diuresis efforts. Check serum magnesium level. Will reassess volume status in the morning and clinically correlate to help guide additional diuresis efforts. In the meantime, I have ordered Lasix 40 mg IV twice daily. Close monitoring of ensuing blood pressure to help dictate need for additional afterload reduction in order to increase cardiac output.  As it is approaching 1 year  from her most recent echogram, with interval hospitalization for acute on chronic diastolic heart failure, I have ordered a repeat echocardiogram to occur tomorrow morning, with readership assigned to [facility] cardiology.  We will repeat high-sensitivity troponin I tomorrow morning, as further detailed below.      #) Acute hypoxic respiratory distress: in the context of no known baseline supplemental oxygen requirements, presenting O2 sat was noted to be 83% on room air, with subsequent improvement to 98 to 100% on 3 L nasal cannula, thereby meeting criteria for acute hypoxic respiratory distress as opposed to acute hypoxic respiratory failure at this time.  While the patient reports a history of COPD, clinically, her presenting acute hypoxic respiratory distress appears more consistent with acutely decompensated heart failure, as further described above. ACS is felt to be less likely at this time in the absence of any recent chest pain and presenting EKG showing no evidence of acute ischemic process.  No evidence of pneumothorax on presenting chest x-ray, and screening COVID-19 testing performed in the ED today was found to be negative.   Plan: further evaluation and management of presenting acute on chronic diastolic heart failure, as above, including monitoring of continuous pulse oximetry with prn supplemental O2 to maintain O2 sats greater than or equal to 92%. monitor on telemetry. Check CMP and CBC in the morning. Check serum Mg and Phos levels.  In the setting of reported CVA, will check VBG to evaluate hypercapnia.  Will refrain from IV steroids, and focus on diuresis efforts given suspected primary process of acutely decompensated heart failure.  As needed albuterol inhaler.      #) Elevated troponin: mildly elevated initial troponin of 29, with repeat value trending up very slightly to 30. suspect that this mildly elevated troponin is on the basis of supply demand mismatch in the setting of  acute hypoxic respiratory distress due to acute on chronic diastolic heart failure as opposed to representing a type I process due to acute plaque rupture.  EKG without any overt evidence of acute ischemic changes medically no evidence of ST elevation.  Additionally, presentation is not associate with any chest pain.  Overall, ACS is felt to be less likely relative to type II supply demand mismatch, as above, will closely monitor on telemetry overnight while treating suspected underlying acute on chronic diastolic heart failure, as further described above.    Plan: Continue outpatient aspirin.  Repeat troponin in the morning, as above.  Also ordered an echocardiogram for the morning, mainly for evaluation of acute on chronic diastolic heart failure, as above, will also monitor closely for evidence of interval focal wall motion normalities, given the complete absence of such on most recent echocardiogram from April 2021.  Monitor on telemetry.  Check serum magnesium level and repeat BMP in the morning.  Repeat CBC in the morning.  Additional evaluation management of presenting acute on chronic diastolic heart failure as suspected driving for spine mildly elevated troponin, as above.       #) Type 2 diabetes mellitus: Prescribed insulin regimen consists of Lantus 10 units subcu daily as well as Humalog 3 units scheduled 3 times daily with meals.  The patient acknowledges suboptimal compliance as an outpatient with both her basal as well as short acting insulin, missing the majority of the doses of both of these medications over the course of the last month.  Presenting blood sugar per presenting CMP noted to be 165.  Plan: Accu-Cheks before every meal and at bedtime with low-dose sliding scale insulin.  Counseled the patient on the importance of improved compliance with outpatient medications.  Check hemoglobin A1c.      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  Plan: I have ordered dedicated plain films of the left shoulder to further evaluate.  As needed acetaminophen for discomfort.     #) COPD: The patient reports a history of such.  Outpatient respiratory regimen is limited to as needed albuterol inhaler.  As described above, I suspect the presenting acute hypoxia respiratory distress is more likely on the basis of acute on chronic diastolic heart failure, particular given concomitant orthopnea and BNP that is 3 times her prior value.  Overall, I do not appreciate any overt acute exacerbation of her underlying COPD, although she will certainly be at risk for development of such on the basis of physiologic stressors posed by her suspected presenting acutely decompensated heart failure.  Plan: As needed albuterol inhaler.  Monitor on continuous pulse oximetry.  Monitor on telemetry.  Check VBG.  Work-up and management of presenting acute on chronic diastolic heart failure, including IV diuresis efforts, as further described above.    DVT prophylaxis: Lovenox 40 units subcu daily Code Status: Full code Family Communication: none Disposition Plan: Per Rounding Team Consults called: none  Admission status: Inpatient; med telemetry     Of note, this patient was added by me to the following Admit List/Treatment Team:  armcadmits      PLEASE NOTE THAT DRAGON DICTATION SOFTWARE WAS USED IN THE CONSTRUCTION OF THIS NOTE.   [facility] Pager [phone] From 12PM- 12AM  Otherwise, please contact night-coverage  [email or web address] Password [name]  04/25/2020, 5:04 PM

## 2020-04-25 NOTE — ED Provider Notes (Signed)
[facility] Emergency Department Provider Note ____________________________________________   Event Date/Time   First MD Initiated Contact with Patient 04/25/20 1537     (approximate)  I have reviewed the triage vital signs and the nursing notes.  HISTORY  Chief Complaint Shortness of Breath (SOB ongoing for aprox 2-days, Hx of CHF)   HPI Karla Sanchez is a 80 y.o. femalewho presents to the ED for evaluation of shortness of breath.   Chart review indicates hx dCHF, obesity, DM on insulin, COPD, HTN and gout.  Patient resides at a local hotel.  She reports 2 days of increasing shortness of breath with associated orthopnea, increased lower extremity swelling and generalized weakness.  Denies fevers, increased cough or productive cough.  Denies abdominal pain, emesis or stool changes.  Denies dysuria, but is report increased urinary frequency. Patient reports that she does not use oxygen at home.  Noted to be 82% with a good waveform upon arrival to the ED on room air.  Past Medical History:  Diagnosis Date  . CHF (congestive heart failure) ([place])   . Colon cancer [facility])    s/p right and left colectomy 2020  . COPD (chronic obstructive pulmonary disease) ([place])   . Diabetes mellitus without complication ([place])   . Diastolic heart failure ([place])   . Gout   . Hypertension     Patient Active Problem List   Diagnosis Date Noted  . B12 deficiency   . Short-term memory loss   . Weakness   . Fungal skin infection   . Obesity (BMI 30-39.9)   . Cellulitis of right leg 01/04/2020  . COPD with acute exacerbation ([place]) 12/08/2019  . Physical deconditioning 12/08/2019  . Acute respiratory failure with hypoxia ([place])   . Iron deficiency anemia   . Type 2 diabetes mellitus without complication, with long-term current use of insulin ([place])   . CHF exacerbation ([place]) 11/20/2019  . Acute on chronic diastolic (congestive) heart failure ([place]) 11/19/2019  . Acute  pulmonary edema (HCC)   . Acute on chronic diastolic CHF (congestive heart failure) ([place]) 10/22/2019  . Cellulitis of right foot 10/22/2019  . Non compliance with medical treatment   . Chronic respiratory failure with hypoxia ([place])   . Hypertension   . Chronic kidney disease, stage 3b ([place])   . Acute on chronic respiratory failure with hypoxia ([place])   . Diarrhea   . COPD (chronic obstructive pulmonary disease) ([place]) 10/03/2019  . Obesity, Class III, BMI 40-49.9 (morbid obesity) ([place]) 10/03/2019  . Acute exacerbation of CHF (congestive heart failure) ([place]) 09/08/2019  . Acute CHF (congestive heart failure) ([place]) 09/07/2019  . COPD exacerbation ([place]) 07/09/2019  . Chronic diastolic CHF (congestive heart failure) ([place]) 07/09/2019  . Type 2 diabetes mellitus with stage 3 chronic kidney disease ([place]) 07/09/2019  . Peripheral edema 07/09/2019  . Gout 07/09/2019  . Obesity, diabetes, and hypertension syndrome ([place]) 07/09/2019  . Anemia of chronic disease 07/09/2019    Past Surgical History:  Procedure Laterality Date  . COLON SURGERY    . TUBAL LIGATION  1970    Prior to Admission medications   Medication Sig Start Date End Date Taking? Authorizing Provider  acetaminophen (TYLENOL) 325 MG tablet Take 650 mg by mouth every 6 (six) hours as needed for mild pain or fever.     [provider]  albuterol (VENTOLIN HFA) 108 (90 Base) MCG/ACT inhaler Inhale 2 puffs into the lungs every 6 (six) hours as needed for wheezing or shortness  of breath. 01/09/20   Loletha Grayer, MD  aspirin EC 81 MG tablet Take 1 tablet (81 mg total) by mouth daily. Swallow whole. 01/09/20   Loletha Grayer, MD  colchicine 0.6 MG tablet Take 0.6 mg by mouth daily as needed (acute gout flare).     [provider]  docusate sodium (COLACE) 100 MG capsule Take 1 capsule (100 mg total) by mouth at bedtime. 01/09/20   Loletha Grayer, MD  ferrous sulfate 325 (65 FE) MG tablet Take 1 tablet (325 mg  total) by mouth daily. 01/09/20   Loletha Grayer, MD  furosemide (LASIX) 40 MG tablet Take 1 tablet (40 mg total) by mouth 2 (two) times daily. 01/09/20   Loletha Grayer, MD  hydrocortisone (ANUSOL-HC) 2.5 % rectal cream Place rectally 2 (two) times daily. 01/09/20   Loletha Grayer, MD  insulin glargine (LANTUS) 100 UNIT/ML Solostar Pen Inject 10 Units into the skin daily. 01/09/20   Loletha Grayer, MD  insulin lispro (HUMALOG) 100 UNIT/ML KwikPen Inject 3 Units into the skin 3 (three) times daily before meals. 01/09/20   Loletha Grayer, MD  Insulin Pen Needle 31G X 8 MM MISC 1 Dose by Does not apply route 3 (three) times daily before meals. 01/09/20   Wieting, Richard, MD  ipratropium-albuterol (DUONEB) 0.5-2.5 (3) MG/3ML SOLN Take 3 mLs by nebulization every 6 (six) hours as needed. 01/09/20   Loletha Grayer, MD  lactulose (CHRONULAC) 10 GM/15ML solution Take 15 mLs (10 g total) by mouth daily. 01/09/20   Wieting, Richard, MD  lidocaine (LIDODERM) 5 % Place 1 patch onto the skin daily. Remove & Discard patch within 12 hours or as directed by MD 01/15/20   Sidney Ace, MD  liver oil-zinc oxide (DESITIN) 40 % ointment Apply 1 application topically in the morning and at bedtime. Uses on buttock 01/09/20   Loletha Grayer, MD  metoprolol tartrate (LOPRESSOR) 25 MG tablet Take 0.5 tablets (12.5 mg total) by mouth 2 (two) times daily. 01/09/20   Loletha Grayer, MD  mirtazapine (REMERON) 15 MG tablet Take 1 tablet (15 mg total) by mouth at bedtime. 01/09/20   Loletha Grayer, MD  nystatin (MYCOSTATIN/NYSTOP) powder Apply topically 2 (two) times daily. 01/09/20   Loletha Grayer, MD  potassium chloride (KLOR-CON) 10 MEQ tablet Take 1 tablet (10 mEq total) by mouth daily. 01/09/20 01/08/21  Loletha Grayer, MD  senna-docusate (SENOKOT-S) 8.6-50 MG tablet Take 1 tablet by mouth daily. 01/09/20   Loletha Grayer, MD  traZODone (DESYREL) 50 MG tablet Take 1 tablet (50 mg total) by  mouth at bedtime as needed for sleep. 01/09/20   Loletha Grayer, MD  vitamin B-12 1000 MCG tablet Take 1 tablet (1,000 mcg total) by mouth daily. 01/09/20   Loletha Grayer, MD    Allergies Codeine, Other, Exenatide, Levofloxacin, Losartan, and Tetracyclines & related  Family History  Problem Relation Age of Onset  . Lung cancer Father     Social History Social History   Tobacco Use  . Smoking status: Never Smoker  . Smokeless tobacco: Never Used  Substance Use Topics  . Alcohol use: Not Currently  . Drug use: Never    Review of Systems  Constitutional: No fever/chills.  Positive generalized weakness. Eyes: No visual changes. ENT: No sore throat. Cardiovascular: Denies chest pain. Respiratory: Positive shortness of breath and orthopnea. Gastrointestinal: No abdominal pain.  No nausea, no vomiting.  No diarrhea.  No constipation. Genitourinary: Negative for dysuria. Musculoskeletal: Negative for back pain. Skin: Negative for rash. Neurological:  Negative for headaches, focal weakness or numbness.  ____________________________________________   PHYSICAL EXAM:  VITAL SIGNS: Vitals:   04/25/20 1615 04/25/20 1630  BP:    Pulse:  (!) 59  Resp:  20  Temp:    SpO2: 98% 98%     Constitutional: Alert and oriented.  Sitting upright in bed.  Dyspneic.  Appears uncomfortable. Eyes: Conjunctivae are normal. PERRL. EOMI. Head: Atraumatic. Nose: No congestion/rhinnorhea. Mouth/Throat: Mucous membranes are moist.  Oropharynx non-erythematous. Neck: No stridor. No cervical spine tenderness to palpation. Cardiovascular: Normal rate, regular rhythm. Grossly normal heart sounds.  Good peripheral circulation. Respiratory: Tachypneic to the mid 20s.  Bibasilar crackles.  No wheezes.  Requiring nasal cannula.. Gastrointestinal: Soft , nondistended, nontender to palpation. No CVA tenderness. Musculoskeletal: . No signs of acute trauma. Pitting edema to bilateral lower  extremities. Neurologic:  Normal speech and language. No gross focal neurologic deficits are appreciated. No gait instability noted. Skin:  Skin is warm, dry and intact. No rash noted. Psychiatric: Mood and affect are normal. Speech and behavior are normal.  ____________________________________________   LABS (all labs ordered are listed, but only abnormal results are displayed)  Labs Reviewed  COMPREHENSIVE METABOLIC PANEL - Abnormal; Notable for the following components:      Result Value   Potassium 5.6 (*)    Glucose, Bld 165 (*)    BUN 30 (*)    Albumin 3.4 (*)    GFR, Estimated 57 (*)    All other components within normal limits  BRAIN NATRIURETIC PEPTIDE - Abnormal; Notable for the following components:   B Natriuretic Peptide 169.6 (*)    All other components within normal limits  TROPONIN I (HIGH SENSITIVITY) - Abnormal; Notable for the following components:   Troponin I (High Sensitivity) 29 (*)    All other components within normal limits  POC SARS CORONAVIRUS 2 AG -  ED - Normal  CBC WITH DIFFERENTIAL/PLATELET  URINALYSIS, COMPLETE (UACMP) WITH MICROSCOPIC   ____________________________________________  12 Lead EKG  Sinus rhythm, rate of 60 bpm.  Normal axis.  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  No evidence of acute ischemia. ____________________________________________  RADIOLOGY  ED MD interpretation: 1 view CXR reviewed by me with hyperinflation, pulmonary vascular congestion.  No evidence of discrete lobar filtration  Official radiology report(s): DG Chest Portable 1 View  Result Date: 04/25/2020 CLINICAL DATA:  CHF exacerbation. EXAM: PORTABLE CHEST 1 VIEW COMPARISON:  01/10/2020 FINDINGS: There are chronic changes of both lung fields bilaterally including pleuroparenchymal scarring and atelectasis. The heart size is enlarged but stable from prior study. Aortic calcifications are noted. There is no pneumothorax. No large pleural effusion.  There are end-stage degenerative changes of both glenohumeral joints. The left glenohumeral joint appears to be inferiorly subluxed and may be frankly dislocated with the humeral head perched on the glenoid. IMPRESSION: 1. COPD without evidence for an acute cardiopulmonary process. 2. 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 There are findings suspicious for a left-sided glenohumeral dislocation or subluxation. The left humeral head appears to be perched on the glenoid. Dedicated left shoulder radiographs are recommended for further evaluation of this finding. This is new since 2021. Electronically Signed   By: Constance Holster M.D.   On: 04/25/2020 16:13    ____________________________________________   PROCEDURES and INTERVENTIONS  Procedure(s) performed (including Critical Care):  .1-3 Lead EKG Interpretation Performed by: Vladimir Crofts, MD Authorized by: Vladimir Crofts, MD     Interpretation: normal     ECG rate:  70   ECG rate assessment: normal     Rhythm: sinus rhythm     Ectopy: none     Conduction: normal      Medications  furosemide (LASIX) injection 60 mg (has no administration in time range)    ____________________________________________   MDM / ED COURSE   80 year old woman presents to the ED with evidence of a CHF exacerbation requiring admission.  Hypoxic to the low 80s on room air, necessitating nasal cannula, otherwise hemodynamically stable.  Exam with stigmata of volume overload in an obese patient.  She has slight tachypnea, but no distress, signs of trauma or neurovascular deficits.  Blood work with elevated BNP, further suggestive of CHF exacerbation.  No evidence of sepsis.  CXR demonstrates no infiltrates to suggest pneumonia.  We will initiate diuresis in the ED and discussed the case with hospitalist for medical admission.      ____________________________________________   FINAL CLINICAL IMPRESSION(S) / ED DIAGNOSES  Final  diagnoses:  Shortness of breath  Hypoxia  Acute on chronic diastolic congestive heart failure ([place])  Orthopnea     ED Discharge Orders    None       Jemiah [place]   Note:  This document was prepared using Systems analyst and may include unintentional dictation errors.   Vladimir Crofts, MD 04/25/20 [phone]

## 2020-04-25 NOTE — Progress Notes (Signed)
PHARMACIST - PHYSICIAN COMMUNICATION  CONCERNING:  Enoxaparin (Lovenox) for DVT Prophylaxis    RECOMMENDATION: Patient was prescribed enoxaprin 40mg  q24 hours for VTE prophylaxis.   Filed Weights   04/25/20 1616  Weight: 99.8 kg (220 lb)    Body mass index is 35.51 kg/m.  Estimated Creatinine Clearance: 54.4 mL/min (by C-G formula based on SCr of 1 mg/dL).   Based on [place] patient is candidate for enoxaparin 0.5mg /kg TBW SQ every 24 hours based on BMI being >30.  DESCRIPTION: Pharmacy has adjusted enoxaparin dose per [facility] policy.  Patient is now receiving enoxaparin 50 mg every 24 hours    Dorothe Pea, PharmD Clinical Pharmacist  04/25/2020 8:01 PM

## 2020-04-26 ENCOUNTER — Inpatient Hospital Stay: Payer: Medicare Other

## 2020-04-26 ENCOUNTER — Inpatient Hospital Stay: Admit: 2020-04-26 | Payer: Medicare Other

## 2020-04-26 DIAGNOSIS — I5033 Acute on chronic diastolic (congestive) heart failure: Secondary | ICD-10-CM

## 2020-04-26 LAB — BASIC METABOLIC PANEL
Anion gap: 9 (ref 5–15)
BUN: 27 mg/dL — ABNORMAL HIGH (ref 8–23)
CO2: 30 mmol/L (ref 22–32)
Calcium: 8.6 mg/dL — ABNORMAL LOW (ref 8.9–10.3)
Chloride: 102 mmol/L (ref 98–111)
Creatinine, Ser: 0.98 mg/dL (ref 0.44–1.00)
GFR, Estimated: 59 mL/min — ABNORMAL LOW (ref >60)
Glucose, Bld: 168 mg/dL — ABNORMAL HIGH (ref 70–99)
Potassium: 4.9 mmol/L (ref 3.5–5.1)
Sodium: 141 mmol/L (ref 135–145)

## 2020-04-26 LAB — BLOOD GAS, VENOUS
Acid-Base Excess: 6.6 mmol/L — ABNORMAL HIGH (ref 0.0–2.0)
Bicarbonate: 33.5 mmol/L — ABNORMAL HIGH (ref 20.0–28.0)
O2 Saturation: 98.5 %
Patient temperature: 37
pCO2, Ven: 58 mmHg (ref 44.0–60.0)
pH, Ven: 7.37 (ref 7.250–7.430)
pO2, Ven: 118 mmHg — ABNORMAL HIGH (ref 32.0–45.0)

## 2020-04-26 LAB — MAGNESIUM: Magnesium: 2 mg/dL (ref 1.7–2.4)

## 2020-04-26 LAB — CBC
HCT: 37.3 % (ref 36.0–46.0)
Hemoglobin: 11.5 g/dL — ABNORMAL LOW (ref 12.0–15.0)
MCH: 26 pg (ref 26.0–34.0)
MCHC: 30.8 g/dL (ref 30.0–36.0)
MCV: 84.2 fL (ref 80.0–100.0)
Platelets: 261 10*3/uL (ref 150–400)
RBC: 4.43 MIL/uL (ref 3.87–5.11)
RDW: 15.2 % (ref 11.5–15.5)
WBC: 7.6 10*3/uL (ref 4.0–10.5)
nRBC: 0 % (ref 0.0–0.2)

## 2020-04-26 LAB — TROPONIN I (HIGH SENSITIVITY): Troponin I (High Sensitivity): 24 ng/L — ABNORMAL HIGH (ref <18)

## 2020-04-26 LAB — CBG MONITORING, ED
Glucose-Capillary: 209 mg/dL — ABNORMAL HIGH (ref 70–99)
Glucose-Capillary: 119 mg/dL — ABNORMAL HIGH (ref 70–99)

## 2020-04-26 LAB — PHOSPHORUS: Phosphorus: 4.5 mg/dL (ref 2.5–4.6)

## 2020-04-26 LAB — HEMOGLOBIN A1C
Hgb A1c MFr Bld: 7.5 % — ABNORMAL HIGH (ref 4.8–5.6)
Mean Plasma Glucose: 168.55 mg/dL

## 2020-04-26 MED ORDER — AMLODIPINE BESYLATE 5 MG PO TABS
5.0000 mg | ORAL_TABLET | Freq: Every day | ORAL | Status: DC
  Administered 2020-04-26 – 2020-04-28 (×3): 5 mg via ORAL
  Filled 2020-04-26 (×3): qty 1

## 2020-04-26 NOTE — ED Notes (Signed)
Pt had very large bowel movement.  She moved back to bed to eat lunch.

## 2020-04-26 NOTE — Progress Notes (Signed)
PROGRESS NOTE    Karla Sanchez  ZOX:[id number] DOB: January 15, 1941 DOA: 04/25/2020 PCP: Alfredia Client, MD    Brief Narrative:  Karla Sanchez is a 80 y.o. female with medical history significant for chronic diastolic heart failure with most recent echocardiogram in April 2021 showing LVEF 55 to 60% and grade 2 diastolic dysfunction, hypertension, type 2 diabetes mellitus who is admitted to [facility] on 04/25/2020 with acute on chronic diastolic heart failure after presenting from home  to [facility] Emergency Department complaining of shortness of breath    Consultants:   TOC  Procedures: Echo  Antimicrobials:       Subjective: Still with sob. No cp  Objective: Vitals:   04/26/20 1100 04/26/20 1130 04/26/20 1430 04/26/20 1500  BP: (!) 168/138 (!) 161/61 (!) 166/70 (!) 158/69  Pulse: (!) 59 (!) 58 62 66  Resp: (!) 23 18 15 18   Temp:    97.7 F (36.5 C)  TempSrc:    Oral  SpO2: 100% 100% 99% 100%  Weight:      Height:        Intake/Output Summary (Last 24 hours) at 04/26/2020 1523 Last data filed at 04/25/2020 2031 Gross per 24 hour  Intake -  Output 1050 ml  Net -1050 ml   Filed Weights   04/25/20 1616  Weight: 99.8 kg    Examination:  General exam: Appears calm and comfortable  Respiratory system: scattered crackles, no wheezing Cardiovascular system: S1 & S2 heard, RRR. No JVD, murmurs, rubs, gallops or clicks.  Gastrointestinal system: Abdomen is nondistended, soft and nontender. Normal bowel sounds heard. Central nervous system: Alert and oriented. No focal neurological deficits. Extremities: mild edema b/l Skin: warm, dry Psychiatry: Judgement and insight appear normal. Mood & affect appropriate.     Data Reviewed: I have personally reviewed following labs and imaging studies  CBC: Recent Labs  Lab 04/25/20 1552 04/26/20 0541  WBC 7.7 7.6  NEUTROABS 4.2  --   HGB 12.9 11.5*  HCT 41.9 37.3  MCV 84.5 84.2  PLT 288  811   Basic Metabolic Panel: Recent Labs  Lab 04/25/20 1552 04/25/20 1813 04/26/20 0541  NA 138  --  141  K 5.6*  --  4.9  CL 103  --  102  CO2 25  --  30  GLUCOSE 165*  --  168*  BUN 30*  --  27*  CREATININE 1.00  --  0.98  CALCIUM 8.9  --  8.6*  MG  --  2.3 2.0  PHOS  --   --  4.5   GFR: Estimated Creatinine Clearance: 55.5 mL/min (by C-G formula based on SCr of 0.98 mg/dL). Liver Function Tests: Recent Labs  Lab 04/25/20 1552  AST 25  ALT 16  ALKPHOS 86  BILITOT 0.6  PROT 6.9  ALBUMIN 3.4*   No results for input(s): LIPASE, AMYLASE in the last 168 hours. No results for input(s): AMMONIA in the last 168 hours. Coagulation Profile: No results for input(s): INR, PROTIME in the last 168 hours. Cardiac Enzymes: No results for input(s): CKTOTAL, CKMB, CKMBINDEX, TROPONINI in the last 168 hours. BNP (last 3 results) No results for input(s): PROBNP in the last 8760 hours. HbA1C: Recent Labs    04/26/20 0541  HGBA1C 7.5*   CBG: Recent Labs  Lab 04/25/20 2211 04/26/20 0831 04/26/20 1142  GLUCAP 142* 209* 119*   Lipid Profile: No results for input(s): CHOL, HDL, LDLCALC, TRIG, CHOLHDL, LDLDIRECT in the last 72 hours.  Thyroid Function Tests: No results for input(s): TSH, T4TOTAL, FREET4, T3FREE, THYROIDAB in the last 72 hours. Anemia Panel: No results for input(s): VITAMINB12, FOLATE, FERRITIN, TIBC, IRON, RETICCTPCT in the last 72 hours. Sepsis Labs: No results for input(s): PROCALCITON, LATICACIDVEN in the last 168 hours.  No results found for this or any previous visit (from the past 240 hour(s)).       Radiology Studies: DG Shoulder Right  Result Date: 04/26/2020 CLINICAL DATA:  Arthritis. EXAM: RIGHT SHOULDER - 2+ VIEW COMPARISON:  None. FINDINGS: There is no evidence of fracture or dislocation. Moderate narrowing and osteophyte formation is seen involving the right glenohumeral joint. Moderate degenerative changes seen involving the right  acromioclavicular joint. Soft tissues are unremarkable. 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 No acute abnormality seen in the right shoulder. Electronically Signed   By: Marijo Conception M.D.   On: 04/26/2020 08:38   DG Chest Portable 1 View  Result Date: 04/25/2020 CLINICAL DATA:  CHF exacerbation. EXAM: PORTABLE CHEST 1 VIEW COMPARISON:  01/10/2020 FINDINGS: There are chronic changes of both lung fields bilaterally including pleuroparenchymal scarring and atelectasis. The heart size is enlarged but stable from prior study. Aortic calcifications are noted. There is no pneumothorax. No large pleural effusion. There are end-stage degenerative changes of both glenohumeral joints. The left glenohumeral joint appears to be inferiorly subluxed and may be frankly dislocated with the humeral head perched on the glenoid. IMPRESSION: 1. COPD without evidence for an acute cardiopulmonary process. 2. 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 There are findings suspicious for a left-sided glenohumeral dislocation or subluxation. The left humeral head appears to be perched on the glenoid. Dedicated left shoulder radiographs are recommended for further evaluation of this finding. This is new since 2021. Electronically Signed   By: Constance Holster M.D.   On: 04/25/2020 16:13        Scheduled Meds: . aspirin EC  81 mg Oral Daily  . docusate sodium  100 mg Oral Daily  . enoxaparin (LOVENOX) injection  0.5 mg/kg Subcutaneous Q24H  . ferrous sulfate  325 mg Oral Q breakfast  . furosemide  40 mg Intravenous Q12H  . insulin aspart  0-9 Units Subcutaneous TID WC  . cyanocobalamin  1,000 mcg Oral Daily   Continuous Infusions:  Assessment & Plan:   Principal Problem:   Acute on chronic diastolic heart failure (HCC) Active Problems:   DM2 (diabetes mellitus, type 2) (HCC)   COPD (chronic obstructive pulmonary disease) (HCC)    SOB (shortness of breath)   Elevated troponin   #) Acute on chronic diastolic heart failure: EF 55 to 123456, grade 2 diastolic dysfunction Mildly still volume overloaded We will continue Lasix IV twice daily Add amlodipine to better control BP Echo pending BNP elevated I's and O's Daily weight Low-sodium diet       #) Acute hypoxic respiratory distress: in the context of no known baseline supplemental oxygen requirements, presenting O2 sat was noted to be 83% on room air, with subsequent improvement to 98 to 100% on 3 L nasal cannula, thereby meeting criteria for acute hypoxic respiratory distress as opposed to acute hypoxic respiratory failure at this time.   Patient does report she was on oxygen previously but been she did not require it.  This is likely due to her acute diastolic heart failure.   Covid negative Wean oxygen as tolerated, keeping O2 sat above 92%         #)  Elevated troponin: Mildly elevated. Likely due to demand ischemia Trending down No chest pain Echo pending      #) Type 2 diabetes mellitus:  BG stable Continue R-ISS Follow-up hemoglobin A1c      #) Degenerative changes of the bilateral glenohumeral joints: X-ray without dislocation PT OT   #) History of COPD: Without acute exacerbation Continue inhalers     DVT prophylaxis: Lovenox Code Status: Full Family Communication: None at bedside  Status is: Inpatient  Remains inpatient appropriate because:Inpatient level of care appropriate due to severity of illness   Dispo: The patient is from: Home              Anticipated d/c is to: TBD              Anticipated d/c date is: 2 days              Patient currently is not medically stable to d/c.   Difficult to place patient No            LOS: 1 day   Time spent: 35 minutes with more than 50% on [place], MD [facility] Pager [phone]  If 7PM-7AM, please contact  night-coverage 04/26/2020, 3:23 PM

## 2020-04-26 NOTE — Consult Note (Signed)
   Heart Failure Nurse Navigator Note  HFpEF 55-60% the echocardiogram performed in April 2021.  There are no regional wall motion abnormalities.  The internal cavity of the left ventricle is moderately dilated.  Moderate LVH.  Grade 2 diastolic dysfunction.  Right ventricular systolic pressures are moderately elevated.  The left atrium is moderately dilated.  Right atria is mild to moderately dilated.  Mild mitral regurgitation.   Echocardiogram performed on this admission is pending.   Presented to the emergency room with 2 to 3 days of progressive shortness of breath and orthopnea.  She states when she thinks back on it she has been short of breath since Christmas but the last 2 to 3 days it worsened.  Comorbidities:  Hypertension Type 2 diabetes COPD Gout Obesity Chronic kidney disease stage III   Medications:  Aspirin 81 mg daily Furosemide 40 mg IV every 12 hours  Home medications include furosemide 40 mg 2 times a day metoprolol tartrate 12-1/2 mg 2 times a day.  Labs: Sodium 141, potassium 4.9, chloride 102, CO2 30, BUN 27, creatinine 0.98, magnesium 2.0, phosphorus 4.5, white blood count 7.6, hemoglobin 11.5, hematocrit 37.3, platelet count 261, troponin level 24, 30, 29, BNP  169.  Hemoglobin A1c is 7.5 Blood pressure 161/61, was 177/83 on admission. BMI 35.5 Weight 99.8 kg  Assessment:   General-she is awake and alert watching TV in the emergency room.  In no acute distress.  HEENT-no JVD, normocephalic.  Cardiac-heart tones of regular rate and rhythm.  Abdomen-soft non tender  Musculoskeletal-no lower extremity edema noted pedal pulses are palpable at 2+.  Psych-is pleasant and appropriate makes good eye contact.  Neurologic-speech is clear, moves all extremities without difficulty.    Initial meeting with patient in the emergency room.  States that she is currently living in a hotel and does not have access to a scale to weigh herself daily.  She goes  on to state she is wanting to talk to the care coordinators about nursing home placement.  Discussed the outpatient heart failure clinic with the patient and she does not feel that she interested in that ability at this time.  When questioned about missing some of her medications, she states that she had not missed the Lasix and metoprolol but had missed a couple days of her diabetic medications.  Pricilla Riffle RN, CHFN

## 2020-04-26 NOTE — ED Notes (Signed)
Pt assisted to bedside commode

## 2020-04-27 LAB — BASIC METABOLIC PANEL
Anion gap: 9 (ref 5–15)
BUN: 29 mg/dL — ABNORMAL HIGH (ref 8–23)
CO2: 31 mmol/L (ref 22–32)
Calcium: 8.5 mg/dL — ABNORMAL LOW (ref 8.9–10.3)
Chloride: 102 mmol/L (ref 98–111)
Creatinine, Ser: 1.07 mg/dL — ABNORMAL HIGH (ref 0.44–1.00)
GFR, Estimated: 53 mL/min — ABNORMAL LOW (ref >60)
Glucose, Bld: 128 mg/dL — ABNORMAL HIGH (ref 70–99)
Potassium: 4.8 mmol/L (ref 3.5–5.1)
Sodium: 142 mmol/L (ref 135–145)

## 2020-04-27 LAB — GLUCOSE, CAPILLARY
Glucose-Capillary: 126 mg/dL — ABNORMAL HIGH (ref 70–99)
Glucose-Capillary: 161 mg/dL — ABNORMAL HIGH (ref 70–99)
Glucose-Capillary: 180 mg/dL — ABNORMAL HIGH (ref 70–99)
Glucose-Capillary: 225 mg/dL — ABNORMAL HIGH (ref 70–99)

## 2020-04-27 MED ORDER — PANTOPRAZOLE SODIUM 20 MG PO TBEC
20.0000 mg | DELAYED_RELEASE_TABLET | Freq: Every day | ORAL | Status: DC
  Administered 2020-04-28: 20 mg via ORAL
  Filled 2020-04-27 (×2): qty 1

## 2020-04-27 MED ORDER — INSULIN ASPART 100 UNIT/ML ~~LOC~~ SOLN
0.0000 [IU] | Freq: Every day | SUBCUTANEOUS | Status: DC
  Administered 2020-04-27: 2 [IU] via SUBCUTANEOUS
  Filled 2020-04-27: qty 1

## 2020-04-27 MED ORDER — METHYLPREDNISOLONE SODIUM SUCC 125 MG IJ SOLR
60.0000 mg | Freq: Every day | INTRAMUSCULAR | Status: DC
  Administered 2020-04-27 – 2020-04-28 (×2): 60 mg via INTRAVENOUS
  Filled 2020-04-27 (×2): qty 2

## 2020-04-27 MED ORDER — INSULIN ASPART 100 UNIT/ML ~~LOC~~ SOLN
0.0000 [IU] | Freq: Three times a day (TID) | SUBCUTANEOUS | Status: DC
  Administered 2020-04-28: 2 [IU] via SUBCUTANEOUS
  Administered 2020-04-28: 5 [IU] via SUBCUTANEOUS
  Filled 2020-04-27 (×2): qty 1

## 2020-04-27 MED ORDER — FUROSEMIDE 10 MG/ML IJ SOLN
40.0000 mg | Freq: Every day | INTRAMUSCULAR | Status: DC
  Administered 2020-04-28: 40 mg via INTRAVENOUS
  Filled 2020-04-27: qty 4

## 2020-04-27 NOTE — Progress Notes (Signed)
PROGRESS NOTE    Karla Sanchez  KHT:[id number] DOB: 1941/02/22 DOA: 04/25/2020 PCP: Alfredia Client, MD    Brief Narrative:  Karla Sanchez is a 80 y.o. female with medical history significant for chronic diastolic heart failure with most recent echocardiogram in April 2021 showing LVEF 55 to 60% and grade 2 diastolic dysfunction, hypertension, type 2 diabetes mellitus who is admitted to [facility] on 04/25/2020 with acute on chronic diastolic heart failure after presenting from home  to [facility] complaining of shortness of breath  2/5-pt refused vitals last night. At times refused her pills. With ambulation on RA 02 sat 86% with PT, improved to 90% with rest on 2L  Consultants:   TOC  Procedures: Echo  Antimicrobials:       Subjective: Less sob. Less le edema . No cp  Objective: Vitals:   04/26/20 2323 04/27/20 0433 04/27/20 0829 04/27/20 1108  BP: (!) 154/69  (!) 161/65 (!) 152/56  Pulse: (!) 59  75 (!) 54  Resp: 20  18 16   Temp: (!) 97.4 F (36.3 C)  97.6 F (36.4 C)   TempSrc: Oral  Oral   SpO2: 96%  96% 94%  Weight:  114.3 kg    Height:        Intake/Output Summary (Last 24 hours) at 04/27/2020 1420 Last data filed at 04/27/2020 1353 Gross per 24 hour  Intake 240 ml  Output 500 ml  Net -260 ml   Filed Weights   04/25/20 1616 04/27/20 0433  Weight: 99.8 kg 114.3 kg    Examination: Sitting in chair, NAD End expiratory mild wheezing, increased expiratory time Regular S1-S2 no gallops Soft nontender nondistended positive bowel sounds Decrease lower extremity edema, most of the edema is in the foot Aaxox3, grossly intact Mood and affect appropriate in current setting  Data Reviewed: I have personally reviewed following labs and imaging studies  CBC: Recent Labs  Lab 04/25/20 1552 04/26/20 0541  WBC 7.7 7.6  NEUTROABS 4.2  --   HGB 12.9 11.5*  HCT 41.9 37.3  MCV 84.5 84.2  PLT 288 532   Basic  Metabolic Panel: Recent Labs  Lab 04/25/20 1552 04/25/20 1813 04/26/20 0541 04/27/20 0902  NA 138  --  141 142  K 5.6*  --  4.9 4.8  CL 103  --  102 102  CO2 25  --  30 31  GLUCOSE 165*  --  168* 128*  BUN 30*  --  27* 29*  CREATININE 1.00  --  0.98 1.07*  CALCIUM 8.9  --  8.6* 8.5*  MG  --  2.3 2.0  --   PHOS  --   --  4.5  --    GFR: Estimated Creatinine Clearance: 54.7 mL/min (A) (by C-G formula based on SCr of 1.07 mg/dL (H)). Liver Function Tests: Recent Labs  Lab 04/25/20 1552  AST 25  ALT 16  ALKPHOS 86  BILITOT 0.6  PROT 6.9  ALBUMIN 3.4*   No results for input(s): LIPASE, AMYLASE in the last 168 hours. No results for input(s): AMMONIA in the last 168 hours. Coagulation Profile: No results for input(s): INR, PROTIME in the last 168 hours. Cardiac Enzymes: No results for input(s): CKTOTAL, CKMB, CKMBINDEX, TROPONINI in the last 168 hours. BNP (last 3 results) No results for input(s): PROBNP in the last 8760 hours. HbA1C: Recent Labs    04/26/20 0541  HGBA1C 7.5*   CBG: Recent Labs  Lab 04/25/20 2211 04/26/20 0831  04/26/20 1142 04/27/20 0751 04/27/20 1105  GLUCAP 142* 209* 119* 126* 161*   Lipid Profile: No results for input(s): CHOL, HDL, LDLCALC, TRIG, CHOLHDL, LDLDIRECT in the last 72 hours. Thyroid Function Tests: No results for input(s): TSH, T4TOTAL, FREET4, T3FREE, THYROIDAB in the last 72 hours. Anemia Panel: No results for input(s): VITAMINB12, FOLATE, FERRITIN, TIBC, IRON, RETICCTPCT in the last 72 hours. Sepsis Labs: No results for input(s): PROCALCITON, LATICACIDVEN in the last 168 hours.  No results found for this or any previous visit (from the past 240 hour(s)).       Radiology Studies: DG Shoulder Right  Result Date: 04/26/2020 CLINICAL DATA:  Arthritis. EXAM: RIGHT SHOULDER - 2+ VIEW COMPARISON:  None. FINDINGS: There is no evidence of fracture or dislocation. Moderate narrowing and osteophyte formation is seen involving  the right glenohumeral joint. Moderate degenerative changes seen involving the right acromioclavicular joint. Soft tissues are unremarkable. 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 No acute abnormality seen in the right shoulder. Electronically Signed   By: Marijo Conception M.D.   On: 04/26/2020 08:38   DG Chest Portable 1 View  Result Date: 04/25/2020 CLINICAL DATA:  CHF exacerbation. EXAM: PORTABLE CHEST 1 VIEW COMPARISON:  01/10/2020 FINDINGS: There are chronic changes of both lung fields bilaterally including pleuroparenchymal scarring and atelectasis. The heart size is enlarged but stable from prior study. Aortic calcifications are noted. There is no pneumothorax. No large pleural effusion. There are end-stage degenerative changes of both glenohumeral joints. The left glenohumeral joint appears to be inferiorly subluxed and may be frankly dislocated with the humeral head perched on the glenoid. IMPRESSION: 1. COPD without evidence for an acute cardiopulmonary process. 2. 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 There are findings suspicious for a left-sided glenohumeral dislocation or subluxation. The left humeral head appears to be perched on the glenoid. Dedicated left shoulder radiographs are recommended for further evaluation of this finding. This is new since 2021. Electronically Signed   By: Constance Holster M.D.   On: 04/25/2020 16:13        Scheduled Meds: . amLODipine  5 mg Oral Daily  . aspirin EC  81 mg Oral Daily  . docusate sodium  100 mg Oral Daily  . enoxaparin (LOVENOX) injection  0.5 mg/kg Subcutaneous Q24H  . ferrous sulfate  325 mg Oral Q breakfast  . [START ON 04/28/2020] furosemide  40 mg Intravenous Daily  . insulin aspart  0-9 Units Subcutaneous TID WC  . methylPREDNISolone (SOLU-MEDROL) injection  60 mg Intravenous Daily  . cyanocobalamin  1,000 mcg Oral Daily   Continuous  Infusions:  Assessment & Plan:   Principal Problem:   Acute on chronic diastolic heart failure (HCC) Active Problems:   DM2 (diabetes mellitus, type 2) (HCC)   COPD (chronic obstructive pulmonary disease) (HCC)   SOB (shortness of breath)   Elevated troponin   #) Acute on chronic diastolic heart failure: EF 55 to [id number], grade 2 diastolic dysfunction Mildly still volume overloaded 2/5-becoming more euvolemic on exam  We will decrease Lasix to 40 mg IV daily  Echo pending  Blood pressure control  BNP was elevated  Continue I's and O's and daily weight  Counseled patient about making sure she does not refuse any of her medications here       #) Acute hypoxic respiratory distress: Due to acute on chronic diastolic heart failure in the context of no known baseline supplemental oxygen requirements, presenting O2 sat  was noted to be 83% on room air, with subsequent improvement to 98 to 100% on 3 L nasal cannula, thereby meeting criteria for acute hypoxic respiratory distress as opposed to acute hypoxic respiratory failure at this time.   2/5-patient also has history of COPD and reports being on 5 L of oxygen in the past and finally was weaned down to room air at some point.   We will continue with Lasix 40 mg IV daily to start today  She is mildly wheezing on exam we will add IV Solu-Medrol  O2 supplementation to keep O2 above 92% wean as tolerated  Covid negative      #) History of COPD: mildly wheezy on exam Will start iv steroid Continue inhalers duonebs     #) Elevated troponin: Mildly elevated. Likely due to demand ischemia 2/5-Trended down No cp Echo pending      #) Type 2 diabetes mellitus: controlled A1c 7.5 bg stable Monitor on steroids RISS        #) Degenerative changes of the bilateral glenohumeral joints: X-ray without dislocation PT-HH OT-SNF          DVT prophylaxis: Lovenox Code Status: Full Family  Communication: None at bedside  Status is: Inpatient  Remains inpatient appropriate because:Inpatient level of care appropriate due to severity of illness   Dispo: The patient is from: Home              Anticipated d/c is to: TBD              Anticipated d/c date is: 2 days              Patient currently is not medically stable to d/c.needs iv steroid and lasix   Difficult to place patient No            LOS: 2 days   Time spent: 35 minutes with more than 50% on [place], MD [facility] Pager [phone]  If 7PM-7AM, please contact night-coverage 04/27/2020, 2:20 PM

## 2020-04-27 NOTE — Evaluation (Signed)
Occupational Therapy Evaluation Patient Details Name: Karla Sanchez MRN: [id number] DOB: May 10, 1940 Today's Date: 04/27/2020    History of Present Illness Pt is a 80 y/o F who is admitted with acute on chronic diastolic heart failure after presenting to ED from home with c/o SOB. PMH: chronic diastolic heart failure, HTN, DM2, colon CA, COPD, gout   Clinical Impression   Pt seen for OT evaluation this date in setting of acute hospitalization d/t SOB. Pt known to this hospital and this author. Pt has been living in the [facility] for close to a year now and uses manual w/c for fxl mobility at baseline, but is usually able to perform stand pivot transfers independently. Pt does present this date with decreased functional activity tolerance and general strength requiring some increased assist for basic ADLs assessed including: CGA for LB dressing in standing with RW for balance, CGA for ADL Transfers, and SETUP for seated UB ADLs d/t low endurance to gather needed items. [name] main c/o in terms of meaningful occupation is that she has become increasingly less able to perform necessary IADLs such as cooking, cleaning, getting groceries, and laundry. Recommend f/u skilled OT services in STR at SNF upon d/c from acute setting for pt to attempt to reach highest attainable level of safety with performance of IADLs, ADLs and ADL mobility.     Follow Up Recommendations  SNF    Equipment Recommendations  3 in 1 bedside commode    Recommendations for Other Services       Precautions / Restrictions Precautions Precautions: Fall Restrictions Weight Bearing Restrictions: No      Mobility Bed Mobility               General bed mobility comments: not observed, pt received sitting in recliner pre/post    Transfers Overall transfer level: Needs assistance   Transfers: Sit to/from Stand;Stand Pivot Transfers Sit to Stand: Min guard Stand pivot transfers: Min guard       General  transfer comment: increased time    Balance Overall balance assessment: Needs assistance Sitting-balance support: Feet supported Sitting balance-Leahy Scale: Good       Standing balance-Leahy Scale: Poor Standing balance comment: UE support                           ADL either performed or assessed with clinical judgement   ADL                                         General ADL Comments: requires SETUP for seated UB ADLs, CGA for standing LB ADLs d/t limited standing tolerance. Uses RW for balance     Vision Patient Visual Report: No change from baseline       Perception     Praxis      Pertinent Vitals/Pain Pain Assessment: Faces Faces Pain Scale: Hurts a little bit Pain Location: gout pain in middle finger L hand, uncomfortable IV site Pain Descriptors / Indicators: Discomfort;Aching Pain Intervention(s): Monitored during session     Hand Dominance Right   Extremity/Trunk Assessment Upper Extremity Assessment Upper Extremity Assessment: Generalized weakness   Lower Extremity Assessment Lower Extremity Assessment: Generalized weakness   Cervical / Trunk Assessment Cervical / Trunk Assessment: Kyphotic   Communication Communication Communication: No difficulties   Cognition Arousal/Alertness: Awake/alert Behavior During Therapy: WFL for tasks assessed/performed Overall  Cognitive Status: Within Functional Limits for tasks assessed                                     General Comments  Pt incontinent of urine with PT providing assistance for changing to clean gown & non skid socks    Exercises Other Exercises Other Exercises: OT facilitates ed with pt re: d/c recommendations including likely need for rehabilitation as she has grown increasingly unable to perform IADLs safely. Pt in agreement.   Shoulder Instructions      Home Living Family/patient expects to be discharged to:: Other (Comment) ([facility]) Living Arrangements: Alone                               Additional Comments: Pt living at [facility]      Prior Functioning/Environment Level of Independence: Needs assistance  Gait / Transfers Assistance Needed: Pt uses w/c for mobility, only performs stand pivot transfers mod I bed<>w/c<>commode. Pt reports she hasn't walked due to gout in BLE. ADL's / Homemaking Assistance Needed: Endorses she is independent with bathing, dressing, toileting with increased time, however, states she has increasingly become unable to perform IADLs for herself such as cooking, cleaning and laundry.   Comments: Pt uses w/c for mobility, only performs stand pivot transfers mod I bed<>w/c. Pt reports she hasn't walked due to gout in BLE.        OT Problem List: Decreased strength;Decreased activity tolerance;Impaired balance (sitting and/or standing);Cardiopulmonary status limiting activity;Obesity;Pain      OT Treatment/Interventions: Self-care/ADL training;Therapeutic exercise;Energy conservation;Therapeutic activities;Patient/family education;Balance training    OT Goals(Current goals can be found in the care plan section) Acute Rehab OT Goals Patient Stated Goal: get better OT Goal Formulation: With patient Time For Goal Achievement: 05/11/20 Potential to Achieve Goals: Fair ADL Goals Pt Will Perform Lower Body Dressing: with supervision Pt Will Transfer to Toilet: with supervision  OT Frequency: Min 1X/week   Barriers to D/C:            Co-evaluation              AM-PAC OT "6 Clicks" Daily Activity     Outcome Measure Help from another person eating meals?: None Help from another person taking care of personal grooming?: A Little Help from another person toileting, which includes using toliet, bedpan, or urinal?: A Little Help from another person bathing (including washing, rinsing, drying)?: A Little Help from another person to put on and taking off regular  upper body clothing?: A Little Help from another person to put on and taking off regular lower body clothing?: A Little 6 Click Score: 19   End of Session Equipment Utilized During Treatment: Gait belt;Rolling walker;Oxygen  Activity Tolerance: Patient tolerated treatment well Patient left: in chair;with call bell/phone within reach  OT Visit Diagnosis: Unsteadiness on feet (R26.81);Muscle weakness (generalized) (M62.81)                Time: 0960-4540 OT Time Calculation (min): 23 min Charges:  OT General Charges $OT Visit: 1 Visit OT Evaluation $OT Eval Moderate Complexity: 1 Mod OT Treatments $Self Care/Home Management : 8-22 mins  Gerrianne Scale, MS, OTR/L ascom [phone] 04/27/20, 2:02 PM

## 2020-04-27 NOTE — Evaluation (Signed)
Physical Therapy Evaluation Patient Details Name: Karla Sanchez MRN: [id number] DOB: 1940/08/12 Today's Date: 04/27/2020   History of Present Illness  Pt is a 80 y/o F who is admitted with acute on chronic diastolic heart failure after presenting to ED from home with c/o SOB. PMH: chronic diastolic heart failure, HTN, DM2, colon CA, COPD, gout  Clinical Impression  Pt agreeable to PT evaluation & performs stand pivot transfer bed>recliner with CGA & extra time. Pt on 2L/min supplemental oxygen upon PT arrival, SpO2 97%. Pt placed on room air & maintains >90% at rest but drops to 86% after transferring with no improvement with pursed lip breathing so pt placed back on 2L/min & pt returns >90%. Pt assisted with changing clothing following incontinent void. Pt would benefit from acute PT services to improve balance with stand pivot transfers to reduce fall risk prior to d/c.     Follow Up Recommendations Home health PT;Supervision - Intermittent    Equipment Recommendations  None recommended by PT    Recommendations for Other Services       Precautions / Restrictions Precautions Precautions: Fall Restrictions Weight Bearing Restrictions: No      Mobility  Bed Mobility               General bed mobility comments: not observed, pt received sitting EOB & left in recliner at end of session    Transfers Overall transfer level: Needs assistance   Transfers: Sit to/from Stand;Stand Pivot Transfers Sit to Stand: Min guard;From elevated surface Stand pivot transfers: Min guard       General transfer comment: bed>recliner, holding to [name] hand/tray table, extra time to complete turn  Ambulation/Gait                Stairs            Wheelchair Mobility    Modified Rankin (Stroke Patients Only)       Balance Overall balance assessment: Needs assistance Sitting-balance support: Feet supported Sitting balance-Leahy Scale: Good       Standing  balance-Leahy Scale: Poor Standing balance comment: requires UE support during stand pivot transfer                             Pertinent Vitals/Pain Pain Assessment: Faces Faces Pain Scale: Hurts a little bit Pain Location: gout pain in middle finger L hand, uncomfortable IV site Pain Descriptors / Indicators: Discomfort;Aching Pain Intervention(s): Monitored during session    Home Living Family/patient expects to be discharged to::  (hotel) Living Arrangements: Alone               Additional Comments: Pt living at [facility]    Prior Function Level of Independence: Independent with assistive device(s)         Comments: Pt uses w/c for mobility, only performs stand pivot transfers mod I bed<>w/c. Pt reports she hasn't walked due to gout in BLE.     Hand Dominance        Extremity/Trunk Assessment   Upper Extremity Assessment Upper Extremity Assessment: Generalized weakness    Lower Extremity Assessment Lower Extremity Assessment: Generalized weakness    Cervical / Trunk Assessment Cervical / Trunk Assessment: Kyphotic  Communication   Communication: No difficulties  Cognition Arousal/Alertness: Awake/alert Behavior During Therapy: WFL for tasks assessed/performed Overall Cognitive Status: Within Functional Limits for tasks assessed  General Comments General comments (skin integrity, edema, etc.): Pt incontinent of urine with PT providing assistance for changing to clean gown & non skid socks    Exercises     Assessment/Plan    PT Assessment Patient needs continued PT services  PT Problem List Decreased strength;Decreased mobility;Decreased activity tolerance;Cardiopulmonary status limiting activity;Decreased balance;Pain       PT Treatment Interventions DME instruction;Therapeutic activities;Modalities;Therapeutic exercise;Patient/family education;Wheelchair mobility  training;Balance training;Functional mobility training    PT Goals (Current goals can be found in the Care Plan section)  Acute Rehab PT Goals Patient Stated Goal: get better PT Goal Formulation: With patient Time For Goal Achievement: 05/11/20 Potential to Achieve Goals: Good    Frequency Min 2X/week   Barriers to discharge Decreased caregiver support      Co-evaluation               AM-PAC PT "6 Clicks" Mobility  Outcome Measure Help needed turning from your back to your side while in a flat bed without using bedrails?: A Little Help needed moving from lying on your back to sitting on the side of a flat bed without using bedrails?: A Little Help needed moving to and from a bed to a chair (including a wheelchair)?: A Little Help needed standing up from a chair using your arms (e.g., wheelchair or bedside chair)?: A Little Help needed to walk in hospital room?: Total Help needed climbing 3-5 steps with a railing? : Total 6 Click Score: 14    End of Session Equipment Utilized During Treatment: Oxygen Activity Tolerance: Patient tolerated treatment well Patient left: in chair;with call bell/phone within reach;with nursing/sitter in room Nurse Communication: Mobility status PT Visit Diagnosis: Muscle weakness (generalized) (M62.81);Unsteadiness on feet (R26.81)    Time: 0350-0938 PT Time Calculation (min) (ACUTE ONLY): 24 min   Charges:   PT Evaluation $PT Eval Low Complexity: 1 Low PT Treatments $Therapeutic Activity: 8-22 mins        Lavone Nian, PT, DPT 04/27/20, 11:18 AM   [name] 04/27/2020, 11:16 AM

## 2020-04-28 LAB — GLUCOSE, CAPILLARY
Glucose-Capillary: 183 mg/dL — ABNORMAL HIGH (ref 70–99)
Glucose-Capillary: 253 mg/dL — ABNORMAL HIGH (ref 70–99)

## 2020-04-28 LAB — BASIC METABOLIC PANEL
Anion gap: 10 (ref 5–15)
BUN: 43 mg/dL — ABNORMAL HIGH (ref 8–23)
CO2: 27 mmol/L (ref 22–32)
Calcium: 8.7 mg/dL — ABNORMAL LOW (ref 8.9–10.3)
Chloride: 100 mmol/L (ref 98–111)
Creatinine, Ser: 1.3 mg/dL — ABNORMAL HIGH (ref 0.44–1.00)
GFR, Estimated: 42 mL/min — ABNORMAL LOW (ref >60)
Glucose, Bld: 239 mg/dL — ABNORMAL HIGH (ref 70–99)
Potassium: 5.6 mmol/L — ABNORMAL HIGH (ref 3.5–5.1)
Sodium: 137 mmol/L (ref 135–145)

## 2020-04-28 MED ORDER — ENOXAPARIN SODIUM 60 MG/0.6ML ~~LOC~~ SOLN
0.5000 mg/kg | SUBCUTANEOUS | Status: DC
  Filled 2020-04-28: qty 0.6

## 2020-04-28 MED ORDER — SODIUM POLYSTYRENE SULFONATE 15 GM/60ML PO SUSP: 30.0000 g | Freq: Once | ORAL | Status: DC

## 2020-04-28 NOTE — Plan of Care (Signed)
Patient left AMA.

## 2020-04-28 NOTE — Progress Notes (Signed)
Patient states she she is going home to [facility] and wants to be discharged.  MD notified of patient request.  She states she will not stay in the hospital.

## 2020-04-28 NOTE — Progress Notes (Signed)
MD states for patient to leave AMA if she will not reconsider.  RN encouraged patient to stay due to concerns of patient's health however patient became more escalated stating "you can't keep me here. I have rights and she only came to get insulin refilled."  Patient refused to sign AMA form.  PIV was removed prior to leaving.  Patient called taxi for transport.

## 2020-04-28 NOTE — TOC Initial Note (Signed)
Transition of Care [facility]) - Initial/Assessment Note    Patient Details  Name: Karla Sanchez MRN: [id number] Date of Birth: May 24, 1940  Transition of Care [facility]) CM/SW Contact:    Elliot Gurney [place], [place] Phone Number: [phone] 04/28/2020, 10:43 AM  Clinical Narrative:                 Patient is an 80 year old female who admitted with complaints of shortness of breath.  Per patient, she currently resides at the [facility] and has been there on and off for the last year while she looks for an apartment to move into. Patient's goals conflicting, she initially stated that she was willing to return to the hotel with [facility] as recommended by PT and then later stated that she felt that she needed a nursing home stay. Patient's concerns discussed with PT who stated that she would be seen again Monday or Tuesday.  [place], [facility] [phone]   Transition of Care to continue to follow to coordinate discharge needs.   Guliana Weyandt, LCSW Transition of Care [phone]   Expected Discharge Plan: [place]     Patient Goals and CMS Choice Patient states their goals for this hospitalization and ongoing recovery are:: "I am ready to go home"   Choice offered to / list presented to : Patient  Expected Discharge Plan and Services Expected Discharge Plan: [place] Acute Care Choice: [place] arrangements for the past 2 months: Hotel/Motel ([place])                                      Prior Living Arrangements/Services Living arrangements for the past 2 months: Hotel/Motel ([place]) Lives with:: Self Patient language and need for interpreter reviewed:: Yes Do you feel safe going back to the place where you live?: Yes      Need for Family Participation in Patient Care: Yes (Comment) Care giver support system in place?: Yes (comment)    Criminal Activity/Legal Involvement Pertinent to Current Situation/Hospitalization: No - Comment as needed  Activities of Daily Living Home Assistive Devices/Equipment: Environmental consultant (specify type),Wheelchair (maual and nonworking electric wheelchair) ADL Screening (condition at time of admission) Patient's cognitive ability adequate to safely complete daily activities?: Yes Is the patient deaf or have difficulty hearing?: No Does the patient have difficulty seeing, even when wearing glasses/contacts?: No Does the patient have difficulty concentrating, remembering, or making decisions?: No Patient able to express need for assistance with ADLs?: No Does the patient have difficulty dressing or bathing?: No Independently performs ADLs?: Yes (appropriate for developmental age) Does the patient have difficulty walking or climbing stairs?: Yes Weakness of Legs: Both Weakness of Arms/Hands: Both  Permission Sought/Granted   Permission granted to share information with : Yes, Verbal Permission Granted  Share Information with NAME: Sanders granted to share info w Relationship: Friend  Permission granted to share info w Contact Information: [phone]  [phone]  Emotional Assessment Appearance:: Appears stated age Attitude/Demeanor/Rapport: Engaged Affect (typically observed): Accepting,Adaptable Orientation: : Oriented to Self,Oriented to Place,Oriented to  Time,Oriented to Situation Alcohol / Substance Use: Not Applicable Psych Involvement: No (comment)  Admission diagnosis:  Orthopnea [R06.01] Shortness of breath [R06.02] Acute on chronic diastolic heart failure ([place]) [I50.33]  Hypoxia [R09.02] 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 Acute on chronic diastolic heart failure ([place]) 04/25/2020  . SOB (shortness of breath)  04/25/2020  . Elevated troponin 04/25/2020  . B12 deficiency   . Short-term memory loss   . Weakness   . Fungal skin infection   . Obesity (BMI 30-39.9)   . Cellulitis of right leg 01/04/2020  . COPD with acute exacerbation ([place]) 12/08/2019  . Physical deconditioning 12/08/2019  . Acute respiratory failure with hypoxia ([place])   . Iron deficiency anemia   . Type 2 diabetes mellitus without complication, with long-term current use of insulin ([place])   . CHF exacerbation ([place]) 11/20/2019  . Acute on chronic diastolic (congestive) heart failure ([place]) 11/19/2019  . Acute pulmonary edema (HCC)   . Acute on chronic diastolic CHF (congestive heart failure) ([place]) 10/22/2019  . Cellulitis of right foot 10/22/2019  . Non compliance with medical treatment   . Chronic respiratory failure with hypoxia ([place])   . Hypertension   . Chronic kidney disease, stage 3b ([place])   . Acute on chronic respiratory failure with hypoxia ([place])   . Diarrhea   . COPD (chronic obstructive pulmonary disease) ([place]) 10/03/2019  . Obesity, Class III, BMI 40-49.9 (morbid obesity) ([employer]) 10/03/2019  . Acute exacerbation of CHF (congestive heart failure) ([place]) 09/08/2019  . Acute CHF (congestive heart failure) ([place]) 09/07/2019  . COPD exacerbation ([place]) 07/09/2019  . Chronic diastolic CHF (congestive heart failure) ([place]) 07/09/2019  . DM2 (diabetes mellitus, type 2) ([place]) 07/09/2019  . Peripheral edema 07/09/2019  . Gout 07/09/2019  . Obesity, diabetes, and hypertension syndrome ([place]) 07/09/2019  . Anemia of chronic disease 07/09/2019   PCP:  Alfredia Client, MD Pharmacy:   [facility] [place], [place] [address] Phone: [phone] Fax: [phone]  [place], [place] - [place] [address] Phone: [phone] Fax: [phone]     Social  Determinants of Health (SDOH) Interventions    Readmission Risk Interventions Readmission Risk Prevention Plan 10/24/2019  Medication Review ([place]) Complete  HRI or [place] Patient Refused

## 2020-04-28 NOTE — Discharge Summary (Signed)
Karla Sanchez [id number] DOB: 02-Sep-1940 DOA: 04/25/2020  PCP: Alfredia Client, MD  Admit date: 04/25/2020 Discharge date: 04/28/2020  Admitted From: home Disposition:  Signed AMA     Discharge Condition:Stable CODE STATUS:full    Brief/Interim Summary: Karla Sanchez a 80 y.o.femalewith medical history significant forchronic diastolic heart failure with most recent echocardiogram in April 2021 showing LVEF 55 to 60% and grade 2 diastolic dysfunction, hypertension, type 2 diabetes mellituswho is admitted to [facility]/2022with acute on chronic diastolic heart failureafter presenting from home to [facility] Department complaining of shortness of breath.ED Course:  Vital signs in the ED were notable for the following: Temperature 98.4; heart rate 62-69; blood pressure 174/58; respiratory rate 19-21; initial oxygen saturation noted to be 83% on room air, which subsequently improved to 98 to 100% on 3 L nasal cannula. Screening COVID-19 antigen performed in the ED this evening was found to be negative.  #) Acute on chronic diastolic heart failure: EF 55 to [id number], grade 2 diastolic dysfunction Improving.  Creatinine up , will dc lasix iv today and monitor Echo pending bnp was elevated bp control         #) Acute hypoxic respiratory distress: Due to acute on chronic diastolic heart failure in the context of no known baseline supplemental oxygen requirements, presenting O2 satwas noted to be 83% on room air, with subsequent improvement to 98 to 100% on 3 L nasal cannula, thereby meeting criteria for acute hypoxic respiratory distress as opposed to acute hypoxic respiratory failure at this time. Still wheezy on exam, likely componen of acute COPD exacerbation.   We will need to continue on IV steroids.   O2 supplementation to keep O2 above 92%.  She may end up with oxygen on discharge as her O2 sats drops to 80s with  ambulation. However , nsg contacted me that pt leaving AMA    #) acute exacerbation of COPD:  Still wheezy on exam, was treated with IV steroids  treat     #) Elevated troponin: Mildly elevated. Likely due to demand ischemia Trended down No cp Echo pending      #) Type 2 diabetes mellitus:controlled A1c 7.5 bg was stable now at times elevated due to steroids          #) Degenerative changes of the bilateral glenohumeral joints: X-ray without dislocation [id number]       Discharge Diagnoses:  Principal Problem:   Acute on chronic diastolic heart failure (HCC) Active Problems:   DM2 (diabetes mellitus, type 2) (HCC)   COPD (chronic obstructive pulmonary disease) (HCC)   SOB (shortness of breath)   Elevated troponin    Discharge Instructions     Allergies  Allergen Reactions  . Codeine Nausea Only, Other (See Comments) and Swelling    Other Reaction: nausea and vomiting Other reaction(s): NAUSEA   . Other Other (See Comments)    Other reaction(s): SWELLING  Other reaction(s): MUSCLE PAIN Muscle aches Other reaction(s): MUSCLE PAIN Muscle aches Other reaction(s): MUSCLE PAIN Muscle aches   . Exenatide Nausea And Vomiting  . Levofloxacin Other (See Comments)    Other Reaction: sloughing of buccal mucosa Other reaction(s): OTHER Lost skin in mouth Patient states she can take Cipro without difficulty and has taken it many times in the past Other Reaction: sloughing of buccal mucosa   . Losartan Other (See Comments)    Weight gain Weight gain   . Tetracyclines & Related Other (See Comments)  Consultations:     Procedures/Studies: DG Shoulder Right  Result Date: 04/26/2020 CLINICAL DATA:  Arthritis. EXAM: RIGHT SHOULDER - 2+ VIEW COMPARISON:  None. FINDINGS: There is no evidence of fracture or dislocation. Moderate narrowing and osteophyte formation is seen involving the right glenohumeral joint.  Moderate degenerative changes seen involving the right acromioclavicular joint. Soft tissues are unremarkable. 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 No acute abnormality seen in the right shoulder. Electronically Signed   By: Marijo Conception M.D.   On: 04/26/2020 08:38   DG Chest Portable 1 View  Result Date: 04/25/2020 CLINICAL DATA:  CHF exacerbation. EXAM: PORTABLE CHEST 1 VIEW COMPARISON:  01/10/2020 FINDINGS: There are chronic changes of both lung fields bilaterally including pleuroparenchymal scarring and atelectasis. The heart size is enlarged but stable from prior study. Aortic calcifications are noted. There is no pneumothorax. No large pleural effusion. There are end-stage degenerative changes of both glenohumeral joints. The left glenohumeral joint appears to be inferiorly subluxed and may be frankly dislocated with the humeral head perched on the glenoid. IMPRESSION: 1. COPD without evidence for an acute cardiopulmonary process. 2. 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 There are findings suspicious for a left-sided glenohumeral dislocation or subluxation. The left humeral head appears to be perched on the glenoid. Dedicated left shoulder radiographs are recommended for further evaluation of this finding. This is new since 2021. Electronically Signed   By: Constance Holster M.D.   On: 04/25/2020 16:13       Subjective: This am complaing of being hungry .   Discharge Exam: Vitals:   04/28/20 0855 04/28/20 1218  BP: (!) 149/62 (!) 183/76  Pulse: 80 91  Resp: 18 20  Temp: 98.4 F (36.9 C) (!) 97.2 F (36.2 C)  SpO2: 98% 92%   Vitals:   04/27/20 2000 04/28/20 0500 04/28/20 0855 04/28/20 1218  BP: (!) 170/73  (!) 149/62 (!) 183/76  Pulse: 78  80 91  Resp: 19  18 20   Temp: 98.7 F (37.1 C)  98.4 F (36.9 C) (!) 97.2 F (36.2 C)  TempSrc: Oral  Oral Axillary  SpO2: 94%  98% 92%  Weight:  114  kg    Height:        General: Pt is alert, awake, not in acute distress Cardiovascular: RRR, S1/S2 +, no rubs, no gallops Respiratory: expiratory wheezing b/l Abdominal: Soft, NT, ND, bowel sounds + Extremities: foot edema b/l    The results of significant diagnostics from this hospitalization (including imaging, microbiology, ancillary and laboratory) are listed below for reference.     Microbiology: No results found for this or any previous visit (from the past 240 hour(s)).   Labs: BNP (last 3 results) Recent Labs    11/25/19 1738 12/08/19 1448 04/25/20 1552  BNP 81.8 57.9 825.0*   Basic Metabolic Panel: Recent Labs  Lab 04/25/20 1552 04/25/20 1813 04/26/20 0541 04/27/20 0902 04/28/20 1119  NA 138  --  141 142 137  K 5.6*  --  4.9 4.8 5.6*  CL 103  --  102 102 100  CO2 25  --  30 31 27   GLUCOSE 165*  --  168* 128* 239*  BUN 30*  --  27* 29* 43*  CREATININE 1.00  --  0.98 1.07* 1.30*  CALCIUM 8.9  --  8.6* 8.5* 8.7*  MG  --  2.3 2.0  --   --   PHOS  --   --  4.5  --   --    Liver Function Tests: Recent Labs  Lab 04/25/20 1552  AST 25  ALT 16  ALKPHOS 86  BILITOT 0.6  PROT 6.9  ALBUMIN 3.4*   No results for input(s): LIPASE, AMYLASE in the last 168 hours. No results for input(s): AMMONIA in the last 168 hours. CBC: Recent Labs  Lab 04/25/20 1552 04/26/20 0541  WBC 7.7 7.6  NEUTROABS 4.2  --   HGB 12.9 11.5*  HCT 41.9 37.3  MCV 84.5 84.2  PLT 288 261   Cardiac Enzymes: No results for input(s): CKTOTAL, CKMB, CKMBINDEX, TROPONINI in the last 168 hours. BNP: Invalid input(s): POCBNP CBG: Recent Labs  Lab 04/27/20 1105 04/27/20 1544 04/27/20 1959 04/28/20 0751 04/28/20 1216  GLUCAP 161* 180* 225* 183* 253*   D-Dimer No results for input(s): DDIMER in the last 72 hours. Hgb A1c Recent Labs    04/26/20 0541  HGBA1C 7.5*   Lipid Profile No results for input(s): CHOL, HDL, LDLCALC, TRIG, CHOLHDL, LDLDIRECT in the last 72  hours. Thyroid function studies No results for input(s): TSH, T4TOTAL, T3FREE, THYROIDAB in the last 72 hours.  Invalid input(s): FREET3 Anemia work up No results for input(s): VITAMINB12, FOLATE, FERRITIN, TIBC, IRON, RETICCTPCT in the last 72 hours. Urinalysis    Component Value Date/Time   COLORURINE YELLOW 04/25/2020 1813   APPEARANCEUR CLEAR 04/25/2020 1813   LABSPEC 1.020 04/25/2020 1813   PHURINE 7.0 04/25/2020 1813   GLUCOSEU NEGATIVE 04/25/2020 1813   HGBUR TRACE (A) 04/25/2020 1813   BILIRUBINUR NEGATIVE 04/25/2020 1813   KETONESUR NEGATIVE 04/25/2020 1813   PROTEINUR >300 (A) 04/25/2020 1813   NITRITE NEGATIVE 04/25/2020 1813   LEUKOCYTESUR NEGATIVE 04/25/2020 1813   Sepsis Labs Invalid input(s): PROCALCITONIN,  WBC,  LACTICIDVEN Microbiology No results found for this or any previous visit (from the past 240 hour(s)).   Time coordinating discharge: Over 30 minutes  SIGNED:   Nolberto Hanlon, MD  [facility] 04/28/2020, 4:00 PM Pager   If 7PM-7AM, please contact night-coverage [email or web address] Password [email or web address]

## 2020-06-09 ENCOUNTER — Emergency Department: Payer: Medicare Other

## 2020-06-09 ENCOUNTER — Inpatient Hospital Stay
Admission: EM | Admit: 2020-06-09 | Discharge: 2020-06-13 | DRG: 280 | Disposition: A | Discharge status: Home / Self Care | Payer: Medicare Other | Attending: Internal Medicine | Admitting: Internal Medicine

## 2020-06-09 DIAGNOSIS — J9601 Acute respiratory failure with hypoxia: Secondary | ICD-10-CM | POA: Diagnosis present

## 2020-06-09 DIAGNOSIS — Z9111 Patient's noncompliance with dietary regimen: Secondary | ICD-10-CM

## 2020-06-09 DIAGNOSIS — E785 Hyperlipidemia, unspecified: Secondary | ICD-10-CM | POA: Diagnosis present

## 2020-06-09 DIAGNOSIS — E875 Hyperkalemia: Secondary | ICD-10-CM | POA: Diagnosis present

## 2020-06-09 DIAGNOSIS — I452 Bifascicular block: Secondary | ICD-10-CM | POA: Diagnosis present

## 2020-06-09 DIAGNOSIS — Z881 Allergy status to other antibiotic agents status: Secondary | ICD-10-CM

## 2020-06-09 DIAGNOSIS — Z66 Do not resuscitate: Secondary | ICD-10-CM | POA: Diagnosis present

## 2020-06-09 DIAGNOSIS — Z20822 Contact with and (suspected) exposure to covid-19: Secondary | ICD-10-CM | POA: Diagnosis present

## 2020-06-09 DIAGNOSIS — D509 Iron deficiency anemia, unspecified: Secondary | ICD-10-CM | POA: Diagnosis present

## 2020-06-09 DIAGNOSIS — N1832 Chronic kidney disease, stage 3b: Secondary | ICD-10-CM | POA: Diagnosis present

## 2020-06-09 DIAGNOSIS — I21A1 Myocardial infarction type 2: Secondary | ICD-10-CM | POA: Diagnosis present

## 2020-06-09 DIAGNOSIS — R0602 Shortness of breath: Secondary | ICD-10-CM | POA: Diagnosis present

## 2020-06-09 DIAGNOSIS — I130 Hypertensive heart and chronic kidney disease with heart failure and stage 1 through stage 4 chronic kidney disease, or unspecified chronic kidney disease: Secondary | ICD-10-CM | POA: Diagnosis present

## 2020-06-09 DIAGNOSIS — I340 Nonrheumatic mitral (valve) insufficiency: Secondary | ICD-10-CM | POA: Diagnosis not present

## 2020-06-09 DIAGNOSIS — N1831 Chronic kidney disease, stage 3a: Secondary | ICD-10-CM | POA: Diagnosis not present

## 2020-06-09 DIAGNOSIS — Z6841 Body Mass Index (BMI) 40.0 and over, adult: Secondary | ICD-10-CM

## 2020-06-09 DIAGNOSIS — J9621 Acute and chronic respiratory failure with hypoxia: Secondary | ICD-10-CM | POA: Diagnosis present

## 2020-06-09 DIAGNOSIS — E119 Type 2 diabetes mellitus without complications: Secondary | ICD-10-CM

## 2020-06-09 DIAGNOSIS — I509 Heart failure, unspecified: Secondary | ICD-10-CM

## 2020-06-09 DIAGNOSIS — M109 Gout, unspecified: Secondary | ICD-10-CM | POA: Diagnosis present

## 2020-06-09 DIAGNOSIS — J441 Chronic obstructive pulmonary disease with (acute) exacerbation: Secondary | ICD-10-CM | POA: Diagnosis present

## 2020-06-09 DIAGNOSIS — E1165 Type 2 diabetes mellitus with hyperglycemia: Secondary | ICD-10-CM | POA: Diagnosis present

## 2020-06-09 DIAGNOSIS — Z79899 Other long term (current) drug therapy: Secondary | ICD-10-CM

## 2020-06-09 DIAGNOSIS — E1122 Type 2 diabetes mellitus with diabetic chronic kidney disease: Secondary | ICD-10-CM | POA: Diagnosis present

## 2020-06-09 DIAGNOSIS — Z885 Allergy status to narcotic agent status: Secondary | ICD-10-CM

## 2020-06-09 DIAGNOSIS — I878 Other specified disorders of veins: Secondary | ICD-10-CM | POA: Diagnosis present

## 2020-06-09 DIAGNOSIS — Z9119 Patient's noncompliance with other medical treatment and regimen: Secondary | ICD-10-CM | POA: Diagnosis not present

## 2020-06-09 DIAGNOSIS — T383X6A Underdosing of insulin and oral hypoglycemic [antidiabetic] drugs, initial encounter: Secondary | ICD-10-CM | POA: Diagnosis present

## 2020-06-09 DIAGNOSIS — Z85038 Personal history of other malignant neoplasm of large intestine: Secondary | ICD-10-CM

## 2020-06-09 DIAGNOSIS — I5033 Acute on chronic diastolic (congestive) heart failure: Secondary | ICD-10-CM | POA: Diagnosis present

## 2020-06-09 DIAGNOSIS — Z993 Dependence on wheelchair: Secondary | ICD-10-CM

## 2020-06-09 DIAGNOSIS — Z888 Allergy status to other drugs, medicaments and biological substances status: Secondary | ICD-10-CM

## 2020-06-09 DIAGNOSIS — Z794 Long term (current) use of insulin: Secondary | ICD-10-CM | POA: Diagnosis not present

## 2020-06-09 DIAGNOSIS — Z801 Family history of malignant neoplasm of trachea, bronchus and lung: Secondary | ICD-10-CM

## 2020-06-09 DIAGNOSIS — Z7982 Long term (current) use of aspirin: Secondary | ICD-10-CM

## 2020-06-09 DIAGNOSIS — F418 Other specified anxiety disorders: Secondary | ICD-10-CM | POA: Diagnosis present

## 2020-06-09 DIAGNOSIS — I350 Nonrheumatic aortic (valve) stenosis: Secondary | ICD-10-CM | POA: Diagnosis not present

## 2020-06-09 DIAGNOSIS — J449 Chronic obstructive pulmonary disease, unspecified: Secondary | ICD-10-CM

## 2020-06-09 DIAGNOSIS — E66813 Obesity, class 3: Secondary | ICD-10-CM | POA: Diagnosis present

## 2020-06-09 LAB — APTT: aPTT: 30 seconds (ref 24–36)

## 2020-06-09 LAB — URINALYSIS, COMPLETE (UACMP) WITH MICROSCOPIC
Bilirubin Urine: NEGATIVE
Glucose, UA: 50 mg/dL — AB
Hgb urine dipstick: NEGATIVE
Ketones, ur: NEGATIVE mg/dL
Leukocytes,Ua: NEGATIVE
Nitrite: POSITIVE — AB
Protein, ur: 100 mg/dL — AB
Specific Gravity, Urine: 1.008 (ref 1.005–1.030)
pH: 6 (ref 5.0–8.0)

## 2020-06-09 LAB — CBC WITH DIFFERENTIAL/PLATELET
Abs Immature Granulocytes: 0.04 10*3/uL (ref 0.00–0.07)
Basophils Absolute: 0 10*3/uL (ref 0.0–0.1)
Basophils Relative: 0 %
Eosinophils Absolute: 0.3 10*3/uL (ref 0.0–0.5)
Eosinophils Relative: 3 %
HCT: 41.7 % (ref 36.0–46.0)
Hemoglobin: 12.5 g/dL (ref 12.0–15.0)
Immature Granulocytes: 1 %
Lymphocytes Relative: 28 %
Lymphs Abs: 2.2 10*3/uL (ref 0.7–4.0)
MCH: 25.5 pg — ABNORMAL LOW (ref 26.0–34.0)
MCHC: 30 g/dL (ref 30.0–36.0)
MCV: 85.1 fL (ref 80.0–100.0)
Monocytes Absolute: 0.5 10*3/uL (ref 0.1–1.0)
Monocytes Relative: 6 %
Neutro Abs: 4.9 10*3/uL (ref 1.7–7.7)
Neutrophils Relative %: 62 %
Platelets: 272 10*3/uL (ref 150–400)
RBC: 4.9 MIL/uL (ref 3.87–5.11)
RDW: 15.9 % — ABNORMAL HIGH (ref 11.5–15.5)
WBC: 7.9 10*3/uL (ref 4.0–10.5)
nRBC: 0 % (ref 0.0–0.2)

## 2020-06-09 LAB — PROTIME-INR
INR: 0.9 (ref 0.8–1.2)
Prothrombin Time: 12.2 seconds (ref 11.4–15.2)

## 2020-06-09 LAB — BETA-HYDROXYBUTYRIC ACID: Beta-Hydroxybutyric Acid: 0.09 mmol/L (ref 0.05–0.27)

## 2020-06-09 LAB — COMPREHENSIVE METABOLIC PANEL
ALT: 14 U/L (ref 0–44)
AST: 16 U/L (ref 15–41)
Albumin: 2.9 g/dL — ABNORMAL LOW (ref 3.5–5.0)
Alkaline Phosphatase: 83 U/L (ref 38–126)
Anion gap: 8 (ref 5–15)
BUN: 29 mg/dL — ABNORMAL HIGH (ref 8–23)
CO2: 28 mmol/L (ref 22–32)
Calcium: 8.5 mg/dL — ABNORMAL LOW (ref 8.9–10.3)
Chloride: 101 mmol/L (ref 98–111)
Creatinine, Ser: 1.12 mg/dL — ABNORMAL HIGH (ref 0.44–1.00)
GFR, Estimated: 50 mL/min — ABNORMAL LOW (ref >60)
Glucose, Bld: 236 mg/dL — ABNORMAL HIGH (ref 70–99)
Potassium: 5.2 mmol/L — ABNORMAL HIGH (ref 3.5–5.1)
Sodium: 137 mmol/L (ref 135–145)
Total Bilirubin: 0.5 mg/dL (ref 0.3–1.2)
Total Protein: 6.4 g/dL — ABNORMAL LOW (ref 6.5–8.1)

## 2020-06-09 LAB — LACTIC ACID, PLASMA
Lactic Acid, Venous: 1.6 mmol/L (ref 0.5–1.9)
Lactic Acid, Venous: 1 mmol/L (ref 0.5–1.9)

## 2020-06-09 LAB — GLUCOSE, CAPILLARY: Glucose-Capillary: 155 mg/dL — ABNORMAL HIGH (ref 70–99)

## 2020-06-09 LAB — BLOOD GAS, VENOUS
Acid-Base Excess: 5 mmol/L — ABNORMAL HIGH (ref 0.0–2.0)
Bicarbonate: 33 mmol/L — ABNORMAL HIGH (ref 20.0–28.0)
FIO2: 0.21
O2 Saturation: 76.6 %
Patient temperature: 37
pCO2, Ven: 64 mmHg — ABNORMAL HIGH (ref 44.0–60.0)
pH, Ven: 7.32 (ref 7.250–7.430)
pO2, Ven: 45 mmHg (ref 32.0–45.0)

## 2020-06-09 LAB — CBG MONITORING, ED: Glucose-Capillary: 241 mg/dL — ABNORMAL HIGH (ref 70–99)

## 2020-06-09 LAB — TROPONIN I (HIGH SENSITIVITY)
Troponin I (High Sensitivity): 238 ng/L (ref <18)
Troponin I (High Sensitivity): 260 ng/L (ref <18)
Troponin I (High Sensitivity): 306 ng/L (ref <18)

## 2020-06-09 LAB — BRAIN NATRIURETIC PEPTIDE: B Natriuretic Peptide: 551.8 pg/mL — ABNORMAL HIGH (ref 0.0–100.0)

## 2020-06-09 LAB — SARS CORONAVIRUS 2 (TAT 6-24 HRS): SARS Coronavirus 2: NEGATIVE

## 2020-06-09 MED ORDER — FUROSEMIDE 10 MG/ML IJ SOLN
40.0000 mg | Freq: Once | INTRAMUSCULAR | Status: AC
  Administered 2020-06-09: 40 mg via INTRAVENOUS
  Filled 2020-06-09: qty 4

## 2020-06-09 MED ORDER — COLCHICINE 0.6 MG PO TABS
0.6000 mg | ORAL_TABLET | Freq: Every day | ORAL | Status: DC | PRN
  Filled 2020-06-09: qty 1

## 2020-06-09 MED ORDER — SODIUM ZIRCONIUM CYCLOSILICATE 5 G PO PACK
5.0000 g | PACK | Freq: Once | ORAL | Status: DC
  Filled 2020-06-09 (×2): qty 1

## 2020-06-09 MED ORDER — SODIUM ZIRCONIUM CYCLOSILICATE 5 G PO PACK
5.0000 g | PACK | Freq: Once | ORAL | Status: AC
  Administered 2020-06-10: 5 g via ORAL
  Filled 2020-06-09 (×2): qty 1

## 2020-06-09 MED ORDER — METOPROLOL TARTRATE 25 MG PO TABS: 12.5000 mg | ORAL_TABLET | Freq: Two times a day (BID) | ORAL | Status: DC

## 2020-06-09 MED ORDER — HYDRALAZINE HCL 20 MG/ML IJ SOLN: 5.0000 mg | Freq: Four times a day (QID) | INTRAMUSCULAR | Status: DC | PRN

## 2020-06-09 MED ORDER — FERROUS SULFATE 325 (65 FE) MG PO TABS
325.0000 mg | ORAL_TABLET | Freq: Every day | ORAL | Status: DC
  Administered 2020-06-10 – 2020-06-13 (×4): 325 mg via ORAL
  Filled 2020-06-09 (×4): qty 1

## 2020-06-09 MED ORDER — ATORVASTATIN CALCIUM 20 MG PO TABS
20.0000 mg | ORAL_TABLET | Freq: Every day | ORAL | Status: DC
  Administered 2020-06-10 – 2020-06-13 (×4): 20 mg via ORAL
  Filled 2020-06-09 (×4): qty 1

## 2020-06-09 MED ORDER — INSULIN ASPART 100 UNIT/ML ~~LOC~~ SOLN
0.0000 [IU] | SUBCUTANEOUS | Status: DC
  Administered 2020-06-10: 8 [IU] via SUBCUTANEOUS
  Administered 2020-06-10: 11 [IU] via SUBCUTANEOUS
  Administered 2020-06-10: 5 [IU] via SUBCUTANEOUS
  Administered 2020-06-10 – 2020-06-11 (×2): 3 [IU] via SUBCUTANEOUS
  Administered 2020-06-11: 5 [IU] via SUBCUTANEOUS
  Administered 2020-06-11: 3 [IU] via SUBCUTANEOUS
  Administered 2020-06-11 – 2020-06-12 (×2): 5 [IU] via SUBCUTANEOUS
  Administered 2020-06-12: 3 [IU] via SUBCUTANEOUS
  Administered 2020-06-12: 5 [IU] via SUBCUTANEOUS
  Administered 2020-06-12: 3 [IU] via SUBCUTANEOUS
  Administered 2020-06-13: 8 [IU] via SUBCUTANEOUS
  Administered 2020-06-13: 2 [IU] via SUBCUTANEOUS
  Filled 2020-06-09 (×14): qty 1

## 2020-06-09 MED ORDER — HEPARIN SODIUM (PORCINE) 5000 UNIT/ML IJ SOLN
4000.0000 [IU] | Freq: Once | INTRAMUSCULAR | Status: DC
  Filled 2020-06-09: qty 1

## 2020-06-09 MED ORDER — FUROSEMIDE 10 MG/ML IJ SOLN
40.0000 mg | Freq: Two times a day (BID) | INTRAMUSCULAR | Status: DC
  Administered 2020-06-09: 40 mg via INTRAVENOUS
  Filled 2020-06-09: qty 4

## 2020-06-09 MED ORDER — ASPIRIN 81 MG PO CHEW
324.0000 mg | CHEWABLE_TABLET | Freq: Once | ORAL | Status: AC
  Administered 2020-06-09: 324 mg via ORAL
  Filled 2020-06-09: qty 4

## 2020-06-09 MED ORDER — HYDRALAZINE HCL 10 MG PO TABS
10.0000 mg | ORAL_TABLET | Freq: Three times a day (TID) | ORAL | Status: DC
  Administered 2020-06-10 – 2020-06-11 (×6): 10 mg via ORAL
  Filled 2020-06-09 (×10): qty 1

## 2020-06-09 MED ORDER — ASPIRIN EC 81 MG PO TBEC
81.0000 mg | DELAYED_RELEASE_TABLET | Freq: Every day | ORAL | Status: DC
  Administered 2020-06-10 – 2020-06-13 (×4): 81 mg via ORAL
  Filled 2020-06-09 (×4): qty 1

## 2020-06-09 MED ORDER — ZINC OXIDE 40 % EX OINT
TOPICAL_OINTMENT | Freq: Two times a day (BID) | CUTANEOUS | Status: DC
  Administered 2020-06-13: 1 via TOPICAL
  Filled 2020-06-09 (×2): qty 113

## 2020-06-09 MED ORDER — HEPARIN BOLUS VIA INFUSION
4000.0000 [IU] | Freq: Once | INTRAVENOUS | Status: AC
  Administered 2020-06-09: 4000 [IU] via INTRAVENOUS
  Filled 2020-06-09: qty 4000

## 2020-06-09 MED ORDER — HEPARIN (PORCINE) 25000 UT/250ML-% IV SOLN
1550.0000 [IU]/h | INTRAVENOUS | Status: DC
  Administered 2020-06-09: 1200 [IU]/h via INTRAVENOUS
  Administered 2020-06-10: 1550 [IU]/h via INTRAVENOUS
  Filled 2020-06-09 (×2): qty 250

## 2020-06-09 MED ORDER — FUROSEMIDE 10 MG/ML IJ SOLN
40.0000 mg | Freq: Three times a day (TID) | INTRAMUSCULAR | Status: DC
  Administered 2020-06-10 – 2020-06-11 (×5): 40 mg via INTRAVENOUS
  Filled 2020-06-09 (×5): qty 4

## 2020-06-09 NOTE — ED Notes (Signed)
Dr. Nevada Crane contacted to notify her of patient's hypertension.

## 2020-06-09 NOTE — ED Notes (Signed)
Patient reports noncompliance with diabetes and CHF medications x several weeks r/t not having a PCP in this area. Patient reports worse shortness of breath with ambulation and exertion. Patient also reports swelling to BLE x 2 weeks as well.

## 2020-06-09 NOTE — ED Notes (Signed)
External urinary catheter removed at patient request. Patient states "I'm so raw down there, it hurts so bad, please take it off." Patient encouraged to let this RN cleanse peri area, and reapply catheter, as patient is on Lasix, but patient insisted she would wet on incontinence bed pads instead. Patient aware of need for urine sample.

## 2020-06-09 NOTE — ED Notes (Addendum)
Patient moved to portable cardiac monitoring until transport to inpatient bed at the request of Lorriane Shire, ED charge RN.

## 2020-06-09 NOTE — ED Notes (Signed)
PA notified of patient oxygen demands, and 3L via [place] applied.

## 2020-06-09 NOTE — ED Notes (Signed)
Patient updated on plan of care. Patient aware of pending admission to the floor.

## 2020-06-09 NOTE — ED Notes (Signed)
Portable cxr performed at this time.

## 2020-06-09 NOTE — Progress Notes (Addendum)
Pt was admitted on the floor with no signs of distress. Pt alert x 4. VSS.  Pt was educated about safety ascom within pt reach. Pt states hungry and have no diet order. Pt also complaints of raw on bottom. Ouma NP made aware. Will continue to monitor.  Update 2158: NP Stark Klein states will place order. Will continue to monitor.  Update 0154: Pt bed was wet and offered to change but pt refused. Pt also states that she wants the purewick on but when offered pt again refused. Pt was laying on the edge of the bed and instructed to get in the middle but pt again refused. Floor mats was offered but again declined by pt. Pt was educated about a wet bed and its affect to skin but pt still refused and states "I just want to be left alone and just sleep at this time". Will continue to monitor.  Update 0540: Per pt request purewqick was placed. Will continue to monitor.  Update 0558: Pt refused EKG at this time. NP Ouma made aware and MD Nehemiah Massed. MD Nehemiah Massed okay to get it at 0800 per pt request. Will continue to monitor.

## 2020-06-09 NOTE — ED Notes (Signed)
Patient transported to inpatient unit by Jenny Reichmann, RN. Patient ready to be accepted on floor at this time per [facility], inpatient RN.

## 2020-06-09 NOTE — ED Provider Notes (Addendum)
[facility] Emergency Department Provider Note  ____________________________________________  Time seen: Approximately 1:41 PM  I have reviewed the triage vital signs and the nursing notes.   HISTORY  Chief Complaint Shortness of Breath    HPI Karla Sanchez is a 80 y.o. female who presents the emergency department via EMS for complaint of malaise, shortness of breath, weakness, increased peripheral edema.  Patient lives full-time at a local hotel.  Patient states that she has not been taking any of her prescribed medications over the past week.  She is a diabetic, has CHF, hypertension, COPD, anemia, gout.  Patient states that she is an insulin-dependent diabetic and has not had insulin for the past week.  She states that she has noticed increased peripheral edema as she has not been taking her Lasix over the past week as well.  Patient feels short of breath and feels that "I need oxygen."  She denies any headache, visual changes, chest pain, abdominal pain, nausea vomiting, diarrhea, constipation, dysuria, polyuria, hematuria.  Patient's main complaint is weakness, shortness of breath and increased peripheral edema.         Past Medical History:  Diagnosis Date   CHF (congestive heart failure) (HCC)    Colon cancer (HCC)    s/p right and left colectomy 2020   COPD (chronic obstructive pulmonary disease) (HCC)    Diabetes mellitus without complication (HCC)    Diastolic heart failure ([place])    Gout    Hypertension     Patient Active Problem List   Diagnosis Date Noted   Acute on chronic diastolic heart failure ([place]) 04/25/2020   SOB (shortness of breath) 04/25/2020   Elevated troponin 04/25/2020   B12 deficiency    Short-term memory loss    Weakness    Fungal skin infection    Obesity (BMI 30-39.9)    Cellulitis of right leg 01/04/2020   COPD with acute exacerbation ([place]) 12/08/2019   Physical deconditioning 12/08/2019    Acute respiratory failure with hypoxia (HCC)    Iron deficiency anemia    Type 2 diabetes mellitus without complication, with long-term current use of insulin (HCC)    CHF exacerbation ([place]) 11/20/2019   Acute on chronic diastolic (congestive) heart failure ([place]) 11/19/2019   Acute pulmonary edema (HCC)    Acute on chronic diastolic CHF (congestive heart failure) ([place]) 10/22/2019   Cellulitis of right foot 10/22/2019   Non compliance with medical treatment    Chronic respiratory failure with hypoxia ([place])    Hypertension    Chronic kidney disease, stage 3b (HCC)    Acute on chronic respiratory failure with hypoxia (HCC)    Diarrhea    COPD (chronic obstructive pulmonary disease) ([place]) 10/03/2019   Obesity, Class III, BMI 40-49.9 (morbid obesity) ([place]) 10/03/2019   Acute exacerbation of CHF (congestive heart failure) ([place]) 09/08/2019   Acute CHF (congestive heart failure) ([place]) 09/07/2019   COPD exacerbation (HCC) 07/09/2019   Chronic diastolic CHF (congestive heart failure) ([place]) 07/09/2019   DM2 (diabetes mellitus, type 2) ([place]) 07/09/2019   Peripheral edema 07/09/2019   Gout 07/09/2019   Obesity, diabetes, and hypertension syndrome ([place]) 07/09/2019   Anemia of chronic disease 07/09/2019    Past Surgical History:  Procedure Laterality Date   COLON SURGERY     TUBAL LIGATION  1970    Prior to Admission medications   Medication Sig Start Date End Date Taking? Authorizing Provider  albuterol (VENTOLIN HFA) 108 (90 Base) MCG/ACT inhaler Inhale 2 puffs  into the lungs every 6 (six) hours as needed for wheezing or shortness of breath. 01/09/20  Yes Loletha Grayer, MD  aspirin EC 81 MG tablet Take 1 tablet (81 mg total) by mouth daily. Swallow whole. 01/09/20  Yes Wieting, Richard, MD  docusate sodium (COLACE) 100 MG capsule Take 1 capsule (100 mg total) by mouth at bedtime. 01/09/20  Yes Wieting, Richard, MD  ferrous sulfate 325 (65 FE) MG tablet Take 1  tablet (325 mg total) by mouth daily. 01/09/20  Yes Wieting, Richard, MD  insulin glargine (LANTUS) 100 UNIT/ML Solostar Pen Inject 10 Units into the skin daily. Patient taking differently: Inject 100 Units into the skin daily. 01/09/20  Yes Wieting, Richard, MD  insulin lispro (HUMALOG) 100 UNIT/ML KwikPen Inject 3 Units into the skin 3 (three) times daily before meals. 01/09/20  Yes Wieting, Richard, MD  Insulin Pen Needle 31G X 8 MM MISC 1 Dose by Does not apply route 3 (three) times daily before meals. 01/09/20  Yes Wieting, Richard, MD  ipratropium-albuterol (DUONEB) 0.5-2.5 (3) MG/3ML SOLN Take 3 mLs by nebulization every 6 (six) hours as needed. 01/09/20  Yes Wieting, Richard, MD  metoprolol tartrate (LOPRESSOR) 25 MG tablet Take 0.5 tablets (12.5 mg total) by mouth 2 (two) times daily. 01/09/20  Yes Wieting, Richard, MD  potassium chloride (KLOR-CON) 10 MEQ tablet Take 1 tablet (10 mEq total) by mouth daily. 01/09/20 01/08/21 Yes Wieting, Richard, MD  traZODone (DESYREL) 50 MG tablet Take 1 tablet (50 mg total) by mouth at bedtime as needed for sleep. 01/09/20  Yes Wieting, Richard, MD  acetaminophen (TYLENOL) 325 MG tablet Take 650 mg by mouth every 6 (six) hours as needed for mild pain or fever.     [provider]  colchicine 0.6 MG tablet Take 0.6 mg by mouth daily as needed (acute gout flare).     [provider]  furosemide (LASIX) 40 MG tablet Take 1 tablet (40 mg total) by mouth 2 (two) times daily. 01/09/20   Loletha Grayer, MD  LEVEMIR 100 UNIT/ML injection SMARTSIG:15 Unit(s) SUB-Q Every Night 03/08/20   [provider]  liver oil-zinc oxide (DESITIN) 40 % ointment Apply 1 application topically in the morning and at bedtime. Uses on buttock 01/09/20   Loletha Grayer, MD  mirtazapine (REMERON) 15 MG tablet Take 1 tablet (15 mg total) by mouth at bedtime. 01/09/20   Loletha Grayer, MD  nystatin (MYCOSTATIN/NYSTOP) powder Apply topically 2 (two) times  daily. 01/09/20   Loletha Grayer, MD    Allergies Codeine, Other, Exenatide, Levofloxacin, Losartan, and Tetracyclines & related  Family History  Problem Relation Age of Onset   Lung cancer Father     Social History Social History   Tobacco Use   Smoking status: Never Smoker   Smokeless tobacco: Never Used  Scientific laboratory technician Use: Never used  Substance Use Topics   Alcohol use: Not Currently   Drug use: Never     Review of Systems  Constitutional: No fever/chills.  Positive for generalized weakness Eyes: No visual changes. No discharge ENT: No upper respiratory complaints. Cardiovascular: no chest pain.  Increased peripheral edema Respiratory: no cough.  Positive SOB. Gastrointestinal: No abdominal pain.  No nausea, no vomiting.  No diarrhea.  No constipation. Genitourinary: Negative for dysuria. No hematuria Musculoskeletal: Negative for musculoskeletal pain. Skin: Negative for rash, abrasions, lacerations, ecchymosis. Neurological: Negative for headaches, focal weakness or numbness.  10 System ROS otherwise negative.  ____________________________________________   PHYSICAL EXAM:  VITAL SIGNS: ED Triage Vitals  Enc Vitals Group     BP      Pulse      Resp      Temp      Temp src      SpO2      Weight      Height      Head Circumference      Peak Flow      Pain Score      Pain Loc      Pain Edu?      Excl. in [place]?      Constitutional: Alert and oriented. Well appearing and in no acute distress. Eyes: Conjunctivae are normal. PERRL. EOMI. Head: Atraumatic. ENT:      Ears:       Nose: No congestion/rhinnorhea.      Mouth/Throat: Mucous membranes are moist.  Neck: No stridor.  Neck is supple full range of motion Hematological/Lymphatic/Immunilogical: No cervical lymphadenopathy. Cardiovascular: Normal rate, regular rhythm. Normal S1 and S2.  Good peripheral circulation.  Visualization of the lower extremities reveals 3+ pitting  edema. Respiratory: Normal respiratory effort without tachypnea or retractions. Lungs with some mild crackles/rales bilateral lower lung fields.  No wheezing.Kermit Balo air entry to the bases with no decreased or absent breath sounds. Gastrointestinal: Bowel sounds 4 quadrants. Soft and nontender to palpation. No guarding or rigidity. No palpable masses. No distention. No CVA tenderness. Musculoskeletal: Full range of motion to all extremities. No gross deformities appreciated. Neurologic:  Normal speech and language. No gross focal neurologic deficits are appreciated.  Cranial nerves grossly intact Skin:  Skin is warm, dry and intact. No rash noted. Psychiatric: Mood and affect are normal. Speech and behavior are normal. Patient exhibits appropriate insight and judgement.   ____________________________________________   LABS (all labs ordered are listed, but only abnormal results are displayed)  Labs Reviewed  COMPREHENSIVE METABOLIC PANEL - Abnormal; Notable for the following components:      Result Value   Potassium 5.2 (*)    Glucose, Bld 236 (*)    BUN 29 (*)    Creatinine, Ser 1.12 (*)    Calcium 8.5 (*)    Total Protein 6.4 (*)    Albumin 2.9 (*)    GFR, Estimated 50 (*)    All other components within normal limits  BRAIN NATRIURETIC PEPTIDE - Abnormal; Notable for the following components:   B Natriuretic Peptide 551.8 (*)    All other components within normal limits  CBC WITH DIFFERENTIAL/PLATELET - Abnormal; Notable for the following components:   MCH 25.5 (*)    RDW 15.9 (*)    All other components within normal limits  BLOOD GAS, VENOUS - Abnormal; Notable for the following components:   pCO2, Ven 64 (*)    Bicarbonate 33.0 (*)    Acid-Base Excess 5.0 (*)    All other components within normal limits  CBG MONITORING, ED - Abnormal; Notable for the following components:   Glucose-Capillary 241 (*)    All other components within normal limits  TROPONIN I (HIGH  SENSITIVITY) - Abnormal; Notable for the following components:   Troponin I (High Sensitivity) 238 (*)    All other components within normal limits  TROPONIN I (HIGH SENSITIVITY) - Abnormal; Notable for the following components:   Troponin I (High Sensitivity) 260 (*)    All other components within normal limits  SARS CORONAVIRUS 2 (TAT 6-24 HRS)  LACTIC ACID, PLASMA  LACTIC ACID, PLASMA  BETA-HYDROXYBUTYRIC ACID  PROTIME-INR  APTT  URINALYSIS, COMPLETE (UACMP) WITH MICROSCOPIC  HEPARIN LEVEL (UNFRACTIONATED)  CBC   ____________________________________________  EKG  ED ECG REPORT I, Charline Bills Normagene Harvie,  personally viewed and interpreted this ECG.   Date: 06/09/2020  EKG Time: 1357 hrs.  Rate: 72 bpm  Rhythm: unchanged from previous tracings, normal sinus rhythm, RBBB, prolonged PR interval, EKG largely unchanged from 04/25/2020  Axis: Left axis deviation  Intervals:Prolonged PR interval, does not appear to be greater than 20 ms  ST&T Change: No ST elevation or depression noted  Sinus rhythm with right bundle branch block.  Prolonged PR interval.  Largely unchanged from previous EKG from 04/25/2020  ____________________________________________  RADIOLOGY I personally viewed and evaluated these images as part of my medical decision making, as well as reviewing the written report by the radiologist.  ED Provider Interpretation: Pulmonary vascular congestion with appearance of possible interstitial edema.  No consolidation consistent with pneumonia  DG Chest Port 1 View  Result Date: 06/09/2020 CLINICAL DATA:  Respiratory distress. EXAM: PORTABLE CHEST 1 VIEW COMPARISON:  Single-view of the chest 05/15/2020 and 12/08/2019. FINDINGS: There is pulmonary vascular congestion. No consolidative process, pneumothorax or effusion. Heart size is normal. Aortic atherosclerosis. No acute bony abnormality. Severe degenerative disease about the shoulders noted. IMPRESSION: Pulmonary  vascular congestion.  No focal abnormality. Aortic Atherosclerosis (ICD10-I70.0). Electronically Signed   By: Inge Rise M.D.   On: 06/09/2020 14:47    ____________________________________________    PROCEDURES  Procedure(s) performed:    .Critical Care Performed by: Darletta Moll, PA-C Authorized by: Darletta Moll, PA-C   Critical care provider statement:    Critical care time (minutes):  48   Critical care was necessary to treat or prevent imminent or life-threatening deterioration of the following conditions:  Cardiac failure and respiratory failure   Critical care was time spent personally by me on the following activities:  Obtaining history from patient or surrogate, examination of patient, evaluation of patient's response to treatment, discussions with consultants, development of treatment plan with patient or surrogate, ordering and performing treatments and interventions, ordering and review of laboratory studies, ordering and review of radiographic studies, pulse oximetry, re-evaluation of patient's condition and review of old charts   Care discussed with: admitting provider        Medications  heparin ADULT infusion 100 units/mL (25000 units/23mL) (1,200 Units/hr Intravenous New Bag/Given 06/09/20 1742)  furosemide (LASIX) injection 40 mg (40 mg Intravenous Given 06/09/20 1909)  sodium zirconium cyclosilicate (LOKELMA) packet 5 g (has no administration in time range)  furosemide (LASIX) injection 40 mg (40 mg Intravenous Given 06/09/20 1421)  aspirin chewable tablet 324 mg (324 mg Oral Given 06/09/20 1737)  heparin bolus via infusion 4,000 Units (4,000 Units Intravenous Bolus from Bag 06/09/20 1742)     ____________________________________________   INITIAL IMPRESSION / ASSESSMENT AND PLAN / ED COURSE  Pertinent labs & imaging results that were available during my care of the patient were reviewed by me and considered in my medical decision making  (see chart for details).  Review of the [place] CSRS was performed in accordance of the [place] prior to dispensing any controlled drugs.           Patient's diagnosis is consistent with COPD, CHF, elevated troponin.  Patient presented to emergency department complaining of shortness of breath, increased peripheral edema.  Patient has a history of CHF, diabetes and has not been taking her medicines for the past week.  Patient did have evidence of  fluid overload with pulmonary congestion and interstitial edema on chest x-ray with increased lower extremity edema.  Patient was slightly hypoxic but was maintained well on 3 L of oxygen.  No indication for BiPAP currently.  Patient did have an elevated troponin but has no chest pain.  I feel that this is likely strain secondary to the patient's CHF but will start heparin.  At this time I have low concern for PE as patient has had no pleuritic pain, no history of DVT or PE..  Given the patient's increased troponin, evidence of fluid overload I feel that she requires admission.  Contacted hospitalist who agrees to admit the patient at this time.       ____________________________________________  FINAL CLINICAL IMPRESSION(S) / ED DIAGNOSES  Final diagnoses:  COPD (chronic obstructive pulmonary disease) (HCC)  CHF (congestive heart failure) (HCC)  SOB (shortness of breath)      NEW MEDICATIONS STARTED DURING THIS VISIT:  ED Discharge Orders    None          This chart was dictated using voice recognition software/Dragon. Despite best efforts to proofread, errors can occur which can change the meaning. Any change was purely unintentional.    Darletta Moll, PA-C 06/09/20 1923    Darletta Moll, PA-C 06/10/20 1536    Lucrezia Starch, MD 06/11/20 [phone]

## 2020-06-09 NOTE — Progress Notes (Signed)
ANTICOAGULATION CONSULT NOTE - Initial Consult  Pharmacy Consult for heparin Indication: chest pain/ACS  Allergies  Allergen Reactions  . Codeine Nausea Only, Other (See Comments) and Swelling    Other Reaction: nausea and vomiting Other reaction(s): NAUSEA   . Other Other (See Comments)    Other reaction(s): SWELLING  Other reaction(s): MUSCLE PAIN Muscle aches Other reaction(s): MUSCLE PAIN Muscle aches Other reaction(s): MUSCLE PAIN Muscle aches   . Exenatide Nausea And Vomiting  . Levofloxacin Other (See Comments)    Other Reaction: sloughing of buccal mucosa Other reaction(s): OTHER Lost skin in mouth Patient states she can take Cipro without difficulty and has taken it many times in the past Other Reaction: sloughing of buccal mucosa   . Losartan Other (See Comments)    Weight gain Weight gain   . Tetracyclines & Related Other (See Comments)    Patient Measurements: Height: 5\' 6"  (167.6 cm) Weight: 125 kg (275 lb 9.2 oz) IBW/kg (Calculated) : 59.3 HEPARIN DW (KG): 89.4  Vital Signs: Temp: 97.9 F (36.6 C) (03/20 1400) Temp Source: Oral (03/20 1400) BP: 102/34 (03/20 1600) Pulse Rate: 59 (03/20 1600)  Labs: Recent Labs    06/09/20 1400  HGB 12.5  HCT 41.7  PLT 272  CREATININE 1.12*  TROPONINIHS 238*    Estimated Creatinine Clearance: 54.1 mL/min (A) (by C-G formula based on SCr of 1.12 mg/dL (H)).   Medical History: Past Medical History:  Diagnosis Date  . CHF (congestive heart failure) ([place])   . Colon cancer [facility])    s/p right and left colectomy 2020  . COPD (chronic obstructive pulmonary disease) ([place])   . Diabetes mellitus without complication ([place])   . Diastolic heart failure ([place])   . Gout   . Hypertension     Assessment: 80 y.o. female presenting with SOB and found to have elevated troponin, ACS. Pharmacy has been consulted for IV heparin dosing.   Hgb and platelets WNL. SCr roughly at baseline compared to previous admission. No  overt bleeding noted. No prior to admission anticoagulants noted.  Goal of Therapy:  Heparin level 0.3-0.7 units/ml Monitor platelets by anticoagulation protocol: Yes  Plan:  Obtain baseline aPTT and PT/INR Heparin bolus 4000 units x 1 Then start heparin infusion at 1200 units/hr Check HL 8 hours from start of infusion Monitor CBC, daily heparin level  Continue to monitor for signs/symptoms of bleeding   Brendolyn Patty, PharmD Clinical Pharmacist  06/09/2020   4:32 PM

## 2020-06-09 NOTE — ED Triage Notes (Signed)
Patient [place] the ED from a hotel, with complaints of worsening SOB x few weeks, and high blood sugars. Patient reports noncomplaince with medication r/t no PCP x 2 weeks. Patient reports hx of CHF and DM.

## 2020-06-09 NOTE — ED Notes (Signed)
Attempt made to contact inpatient RN for report. No answer received. Agricultural consultant notified.

## 2020-06-09 NOTE — H&P (Addendum)
History and Physical  Lynze Reddy MGQ:[id number] DOB: 02-19-1941 DOA: 06/09/2020  Referring physician: Guerry Minors, [place], [place] PCP: Alfredia Client, MD  Outpatient Specialists: Cardiology, oncology. Patient coming from: Home, lives in the hotel voluntarily, not homeless.  Chief Complaint: Worsening shortness of breath and lower extremities swelling.  HPI: Karla Sanchez is a 80 y.o. 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  States that last night her dyspnea worsened significantly.  Was woken out of sleep due to shortness of breath.  Has had difficulties laying down flat.  She denies any chest pain or lower extremity tenderness.  Has had a nonproductive cough for the last 2 to 3 days.  Reports running out of some of her home medications including insulin and Lasix this morning.  She has been eating mainly hotel food and restaurant food without salt restriction.  She decided to come in to the ED for further evaluation.  States she has been living in a hotel by choice.  Moved here from [place], no family left there.  Her son and daughter are busy traveling for work.  States she can no longer take care of herself and is receptive to SNF placement.  Work-up in the ED revealed acute on chronic diastolic CHF with elevated BNP, pulmonary edema on chest x-ray, elevated troponin S 260 from 238.  Started on heparin drip by EDP until ACS is ruled out.  Cardiology consulted by EDP.  TRH asked to admit.  ED Course:  Afebrile, BP 176/58, pulse 63, respiration rate 20, O2 saturation 99% on 3 L.  Lab studies remarkable for serum potassium 5.2, serum glucose 236, creatinine 1.12, GFR  50, BNP 551, troponin S 260 from 238.  CBC unremarkable.  12 EKG sinus rhythm rate of 72 with nonspecific ST-T changes.  QTc 497.  Review of Systems: Review of systems as noted in the HPI. All other systems reviewed and are negative.   Past Medical History:  Diagnosis Date  . CHF (congestive heart failure) ([place])   . Colon cancer [facility])    s/p right and left colectomy 2020  . COPD (chronic obstructive pulmonary disease) ([place])   . Diabetes mellitus without complication ([place])   . Diastolic heart failure ([place])   . Gout   . Hypertension    Past Surgical History:  Procedure Laterality Date  . COLON SURGERY    . TUBAL LIGATION  1970    Social History:  reports that she has never smoked. She has never used smokeless tobacco. She reports previous alcohol use. She reports that she does not use drugs.   Allergies  Allergen Reactions  . Codeine Nausea Only, Other (See Comments) and Swelling    Other Reaction: nausea and vomiting Other reaction(s): NAUSEA   . Other Other (See Comments)    Other reaction(s): SWELLING  Other reaction(s): MUSCLE PAIN Muscle aches Other reaction(s): MUSCLE PAIN Muscle aches Other reaction(s): MUSCLE PAIN Muscle aches   . Exenatide Nausea And Vomiting  . Levofloxacin Other (See Comments)    Other Reaction: sloughing of buccal mucosa Other reaction(s): OTHER Lost skin in mouth Patient states she can take Cipro without difficulty and has taken it many times in the past Other Reaction: sloughing of buccal mucosa   .  Losartan Other (See Comments)    Weight gain Weight gain   . Tetracyclines & Related Other (See Comments)    Family History  Problem Relation Age of Onset  . Lung cancer Father       Prior to Admission medications   Medication Sig Start Date End Date Taking? Authorizing Provider  albuterol (VENTOLIN HFA) 108 (90 Base) MCG/ACT inhaler Inhale 2 puffs into the lungs every 6 (six) hours as needed for wheezing or shortness of breath.  01/09/20  Yes Loletha Grayer, MD  aspirin EC 81 MG tablet Take 1 tablet (81 mg total) by mouth daily. Swallow whole. 01/09/20  Yes Wieting, Richard, MD  docusate sodium (COLACE) 100 MG capsule Take 1 capsule (100 mg total) by mouth at bedtime. 01/09/20  Yes Wieting, Richard, MD  ferrous sulfate 325 (65 FE) MG tablet Take 1 tablet (325 mg total) by mouth daily. 01/09/20  Yes Wieting, Richard, MD  insulin glargine (LANTUS) 100 UNIT/ML Solostar Pen Inject 10 Units into the skin daily. Patient taking differently: Inject 100 Units into the skin daily. 01/09/20  Yes Wieting, Richard, MD  insulin lispro (HUMALOG) 100 UNIT/ML KwikPen Inject 3 Units into the skin 3 (three) times daily before meals. 01/09/20  Yes Wieting, Richard, MD  Insulin Pen Needle 31G X 8 MM MISC 1 Dose by Does not apply route 3 (three) times daily before meals. 01/09/20  Yes Wieting, Richard, MD  ipratropium-albuterol (DUONEB) 0.5-2.5 (3) MG/3ML SOLN Take 3 mLs by nebulization every 6 (six) hours as needed. 01/09/20  Yes Wieting, Richard, MD  metoprolol tartrate (LOPRESSOR) 25 MG tablet Take 0.5 tablets (12.5 mg total) by mouth 2 (two) times daily. 01/09/20  Yes Wieting, Richard, MD  potassium chloride (KLOR-CON) 10 MEQ tablet Take 1 tablet (10 mEq total) by mouth daily. 01/09/20 01/08/21 Yes Wieting, Richard, MD  traZODone (DESYREL) 50 MG tablet Take 1 tablet (50 mg total) by mouth at bedtime as needed for sleep. 01/09/20  Yes Wieting, Richard, MD  acetaminophen (TYLENOL) 325 MG tablet Take 650 mg by mouth every 6 (six) hours as needed for mild pain or fever.     [provider]  colchicine 0.6 MG tablet Take 0.6 mg by mouth daily as needed (acute gout flare).     [provider]  furosemide (LASIX) 40 MG tablet Take 1 tablet (40 mg total) by mouth 2 (two) times daily. 01/09/20   Loletha Grayer, MD  LEVEMIR 100 UNIT/ML injection SMARTSIG:15 Unit(s) SUB-Q Every Night 03/08/20   [provider]  liver  oil-zinc oxide (DESITIN) 40 % ointment Apply 1 application topically in the morning and at bedtime. Uses on buttock 01/09/20   Loletha Grayer, MD  mirtazapine (REMERON) 15 MG tablet Take 1 tablet (15 mg total) by mouth at bedtime. 01/09/20   Loletha Grayer, MD  nystatin (MYCOSTATIN/NYSTOP) powder Apply topically 2 (two) times daily. 01/09/20   Loletha Grayer, MD    Physical Exam: BP (!) 102/34   Pulse (!) 59   Temp 97.9 F (36.6 C) (Oral)   Resp 16   Ht 5\' 6"  (1.676 m)   Wt 125 kg   SpO2 (S) 95%   BMI 44.48 kg/m   . General: 80 y.o. year-old female well developed well nourished in no acute distress.  Alert and oriented x3. . Cardiovascular: Regular rate and rhythm with no rubs or gallops.  No thyromegaly or JVD noted.  2+ pitting edema in lower extremities bilaterally.  2/4 pulses in all 4 extremities. Marland Kitchen  Respiratory: Mild rales at bases with no wheezing noted.  Good inspiratory effort. . Abdomen: Soft nontender nondistended with normal bowel sounds x4 quadrants. . Muskuloskeletal: No cyanosis or clubbing.  2+ pitting edema in lower extremities bilaterally. . Neuro: CN II-XII intact, strength, sensation, reflexes . Skin: No ulcerative lesions noted or rashes . Psychiatry: Judgement and insight appear normal. Mood is appropriate for condition and setting          Labs on Admission:  Basic Metabolic Panel: Recent Labs  Lab 06/09/20 1400  NA 137  K 5.2*  CL 101  CO2 28  GLUCOSE 236*  BUN 29*  CREATININE 1.12*  CALCIUM 8.5*   Liver Function Tests: Recent Labs  Lab 06/09/20 1400  AST 16  ALT 14  ALKPHOS 83  BILITOT 0.5  PROT 6.4*  ALBUMIN 2.9*   No results for input(s): LIPASE, AMYLASE in the last 168 hours. No results for input(s): AMMONIA in the last 168 hours. CBC: Recent Labs  Lab 06/09/20 1400  WBC 7.9  NEUTROABS 4.9  HGB 12.5  HCT 41.7  MCV 85.1  PLT 272   Cardiac Enzymes: No results for input(s): CKTOTAL, CKMB, CKMBINDEX, TROPONINI in the last  168 hours.  BNP (last 3 results) Recent Labs    12/08/19 1448 04/25/20 1552 06/09/20 1400  BNP 57.9 169.6* 551.8*    ProBNP (last 3 results) No results for input(s): PROBNP in the last 8760 hours.  CBG: Recent Labs  Lab 06/09/20 1346  GLUCAP 241*    Radiological Exams on Admission: DG Chest Port 1 View  Result Date: 06/09/2020 CLINICAL DATA:  Respiratory distress. EXAM: PORTABLE CHEST 1 VIEW COMPARISON:  Single-view of the chest 05/15/2020 and 12/08/2019. FINDINGS: There is pulmonary vascular congestion. No consolidative process, pneumothorax or effusion. Heart size is normal. Aortic atherosclerosis. No acute bony abnormality. Severe degenerative disease about the shoulders noted. IMPRESSION: Pulmonary vascular congestion.  No focal abnormality. Aortic Atherosclerosis (ICD10-I70.0). Electronically Signed   By: Inge Rise M.D.   On: 06/09/2020 14:47    EKG: I independently viewed the EKG done and my findings are as followed: Sinus rhythm rate of 72 with nonspecific ST-T changes.  QTc 497.    Assessment/Plan Present on Admission: **None**  Active Problems:   * No active hospital problems. *  Acute on chronic diastolic CHF likely secondary to medication and diet noncompliance. Presented with bilateral lower extremity edema, worsening dyspnea at rest, associated with nonproductive cough, PND and orthopnea of 2 to 3 days duration. Ran out of her home medications including Lasix this morning, has not abide by a salt restriction diet. Elevated BNP, pulmonary edema on chest x-ray, personally reviewed, elevated troponin, new hypoxia. Obtain 2D echo Ongoing diuresing Cardiology consult Strict I's and O's and daily weight  Elevated troponin, rule out ACS Suspect demand ischemia in the setting of acute on chronic diastolic CHF and hypoxemia Currently denies any anginal symptoms. Full dose aspirin given. Started on heparin drip in the ED, continue until seen by  cardiology. Cardiology consulted by EDP. No evidence of acute ischemia on twelve-lead EKG Cycle troponin, last troponin 260 from 238. Obtain fasting lipid panel. Closely monitor on telemetry. Repeat twelve-lead EKG Follow 2D echo results.  Acute hypoxic respiratory failure secondary to pulmonary edema Not on oxygen supplementation at baseline Currently requiring 3 L to maintain O2 saturation greater than 92% Personally reviewed chest x-ray done on admission which shows increasing pulmonary vascularity suggestive of pulmonary edema. Wean off oxygen supplementation as tolerated  Type 2 diabetes with hyperglycemia Likely secondary to noncompliance, states she ran out of her insulin. Obtain hemoglobin A1c Start insulin sliding scale  Gout flare affecting her left third finger States she ran out of her home colchicine. Resume her home medications.  Hyperkalemia Presented with serum potassium 5.2 Give a dose of Lokelma 5 mg x 1. Started on IV Lasix 40 mg twice daily. Repeat BMP in the morning.  CKD 3A Appears to be at her baseline creatinine 1.1 with GFR 50. Avoid nephrotoxic agents Monitor urine output Repeat BMP in the morning.  Physical debility/wheelchair bound. PT OT to assess in the morning Fall precautions. Will likely need placement TOC consulted to assist with SNF placement.    DVT prophylaxis: Heparin drip.  Code Status: DNR stated by the patient herself.  She is alert oriented x3.  Family Communication: None at bedside.  She states she will call family herself.  Disposition Plan: Admit to MedSurg unit with remote telemetry.  Consults called: Cardiology consulted by EDP.  Admission status: Inpatient status.  Patient will require at least 2 midnights for further evaluation and treatment of present condition.   Status is: Inpatient    Dispo:  Patient From: Home  Planned Disposition: Home  Anticipated discharge date 06/11/2020 or when cardiology signs  of.  Medically stable for discharge: No, ongoing management of acute on chronic diastolic CHF, elevated troponin with concern for NSTEMI.         Kayleen Memos MD [facility] Pager [phone]  If 7PM-7AM, please contact night-coverage [email or web address] Password [facility]  06/09/2020, 6:13 PM

## 2020-06-10 ENCOUNTER — Inpatient Hospital Stay (HOSPITAL_COMMUNITY)
Admit: 2020-06-10 | Discharge: 2020-06-10 | Disposition: A | Discharge status: Home / Self Care | Payer: Medicare Other | Attending: Internal Medicine | Admitting: Internal Medicine

## 2020-06-10 DIAGNOSIS — Z794 Long term (current) use of insulin: Secondary | ICD-10-CM

## 2020-06-10 DIAGNOSIS — M109 Gout, unspecified: Secondary | ICD-10-CM

## 2020-06-10 DIAGNOSIS — N1832 Chronic kidney disease, stage 3b: Secondary | ICD-10-CM

## 2020-06-10 DIAGNOSIS — R0602 Shortness of breath: Secondary | ICD-10-CM

## 2020-06-10 DIAGNOSIS — I350 Nonrheumatic aortic (valve) stenosis: Secondary | ICD-10-CM

## 2020-06-10 DIAGNOSIS — E1122 Type 2 diabetes mellitus with diabetic chronic kidney disease: Secondary | ICD-10-CM

## 2020-06-10 DIAGNOSIS — N1831 Chronic kidney disease, stage 3a: Secondary | ICD-10-CM

## 2020-06-10 DIAGNOSIS — I340 Nonrheumatic mitral (valve) insufficiency: Secondary | ICD-10-CM

## 2020-06-10 LAB — ECHOCARDIOGRAM COMPLETE
AR max vel: 1.88 cm2
AV Area VTI: 1.95 cm2
AV Area mean vel: 1.77 cm2
AV Mean grad: 7 mmHg
AV Peak grad: 11.3 mmHg
Ao pk vel: 1.68 m/s
Area-P 1/2: 5.23 cm2
Height: 66 in
MV VTI: 2.59 cm2
S' Lateral: 3.8 cm
Weight: 4081.16 oz

## 2020-06-10 LAB — CBC
HCT: 39.9 % (ref 36.0–46.0)
Hemoglobin: 12 g/dL (ref 12.0–15.0)
MCH: 25.5 pg — ABNORMAL LOW (ref 26.0–34.0)
MCHC: 30.1 g/dL (ref 30.0–36.0)
MCV: 84.9 fL (ref 80.0–100.0)
Platelets: 265 10*3/uL (ref 150–400)
RBC: 4.7 MIL/uL (ref 3.87–5.11)
RDW: 15.7 % — ABNORMAL HIGH (ref 11.5–15.5)
WBC: 8.9 10*3/uL (ref 4.0–10.5)
nRBC: 0 % (ref 0.0–0.2)

## 2020-06-10 LAB — HEMOGLOBIN A1C
Hgb A1c MFr Bld: 8.6 % — ABNORMAL HIGH (ref 4.8–5.6)
Mean Plasma Glucose: 200.12 mg/dL

## 2020-06-10 LAB — GLUCOSE, CAPILLARY
Glucose-Capillary: 103 mg/dL — ABNORMAL HIGH (ref 70–99)
Glucose-Capillary: 116 mg/dL — ABNORMAL HIGH (ref 70–99)
Glucose-Capillary: 170 mg/dL — ABNORMAL HIGH (ref 70–99)
Glucose-Capillary: 247 mg/dL — ABNORMAL HIGH (ref 70–99)
Glucose-Capillary: 282 mg/dL — ABNORMAL HIGH (ref 70–99)
Glucose-Capillary: 325 mg/dL — ABNORMAL HIGH (ref 70–99)

## 2020-06-10 LAB — HEPARIN LEVEL (UNFRACTIONATED): Heparin Unfractionated: <0.1 IU/mL — ABNORMAL LOW (ref 0.30–0.70)

## 2020-06-10 LAB — BASIC METABOLIC PANEL WITH GFR
Anion gap: 8 (ref 5–15)
BUN: 28 mg/dL — ABNORMAL HIGH (ref 8–23)
CO2: 28 mmol/L (ref 22–32)
Calcium: 8.5 mg/dL — ABNORMAL LOW (ref 8.9–10.3)
Chloride: 101 mmol/L (ref 98–111)
Creatinine, Ser: 1.17 mg/dL — ABNORMAL HIGH (ref 0.44–1.00)
GFR, Estimated: 47 mL/min — ABNORMAL LOW
Glucose, Bld: 236 mg/dL — ABNORMAL HIGH (ref 70–99)
Potassium: 4.7 mmol/L (ref 3.5–5.1)
Sodium: 137 mmol/L (ref 135–145)

## 2020-06-10 LAB — LIPID PANEL
Cholesterol: 246 mg/dL — ABNORMAL HIGH (ref 0–200)
HDL: 53 mg/dL
LDL Cholesterol: 151 mg/dL — ABNORMAL HIGH (ref 0–99)
Total CHOL/HDL Ratio: 4.6 ratio
Triglycerides: 208 mg/dL — ABNORMAL HIGH
VLDL: 42 mg/dL — ABNORMAL HIGH (ref 0–40)

## 2020-06-10 LAB — TROPONIN I (HIGH SENSITIVITY)
Troponin I (High Sensitivity): 288 ng/L (ref <18)
Troponin I (High Sensitivity): 235 ng/L (ref <18)

## 2020-06-10 LAB — PHOSPHORUS: Phosphorus: 4.3 mg/dL (ref 2.5–4.6)

## 2020-06-10 LAB — MAGNESIUM: Magnesium: 2.1 mg/dL (ref 1.7–2.4)

## 2020-06-10 MED ORDER — ENOXAPARIN SODIUM 60 MG/0.6ML ~~LOC~~ SOLN
60.0000 mg | SUBCUTANEOUS | Status: DC
  Filled 2020-06-10 (×4): qty 0.6

## 2020-06-10 MED ORDER — METOPROLOL TARTRATE 25 MG PO TABS
25.0000 mg | ORAL_TABLET | Freq: Two times a day (BID) | ORAL | Status: DC
  Administered 2020-06-10 – 2020-06-13 (×6): 25 mg via ORAL
  Filled 2020-06-10 (×7): qty 1

## 2020-06-10 MED ORDER — HEPARIN BOLUS VIA INFUSION
2700.0000 [IU] | Freq: Once | INTRAVENOUS | Status: AC
  Administered 2020-06-10: 2700 [IU] via INTRAVENOUS
  Filled 2020-06-10: qty 2700

## 2020-06-10 MED ORDER — LOPERAMIDE HCL 2 MG PO CAPS: 2.0000 mg | ORAL_CAPSULE | ORAL | Status: DC | PRN

## 2020-06-10 MED ORDER — GLUCERNA SHAKE PO LIQD
237.0000 mL | Freq: Two times a day (BID) | ORAL | Status: DC
  Administered 2020-06-10 – 2020-06-11 (×2): 237 mL via ORAL

## 2020-06-10 MED ORDER — ALLOPURINOL 100 MG PO TABS
100.0000 mg | ORAL_TABLET | Freq: Every day | ORAL | Status: DC
  Administered 2020-06-10 – 2020-06-13 (×4): 100 mg via ORAL
  Filled 2020-06-10 (×4): qty 1

## 2020-06-10 NOTE — Progress Notes (Signed)
PROGRESS NOTE    Karla Sanchez [facility]   DUK:[id number]  DOB: 1940-09-06  DOA: 06/09/2020 PCP: Alfredia Client, MD   Brief Narrative:  Karla Sanchez is a 80 y.o. 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 He is admitted again for acute heart failure.  Troponin noted to be 238.  She was admitted from 2/3-2/6 for acute heart failure and left AMA, she states today, because she knew she was better. She further states that she drinks a lot of liquids.  Subjective: Shortness of breath is a little better today.  After I evaluated her, she told the nurse that she was having dysuria.    Assessment & Plan:   Principal Problem:   Acute on chronic diastolic heart failure  -Suspect this is secondary to excess fluid drinking -Continue IV diuretics-we will likely need to increase dose of diuretics when she is discharged -Continue to follow I  & O and daily weights -We will request a dietitian consult to talk to her about 1200 cc fluid restriction and sodium and carbohydrate restriction  Active Problems:  Type II NSTEMI -Secondary to above mentioned heart failure  Burning micturition -Obtain clean-catch UA    DM2 (diabetes mellitus, type 2), uncontrolled with hyperglycemia -The patient has various different types and doses of insulin listed in the computer-she tells me she cannot remember what she was taking and how much she was taking -Follow on sliding scale insulin for now    Component Value Date/Time   HGBA1C 8.6 (H) 06/10/2020 0116     Gout -See as needed colchicine ordered for  outpatient -We will start allopurinol 100 mg daily    Obesity, Class III, BMI 40-49.9 (morbid obesity)  Body mass index is 41.17 kg/m.     Chronic kidney disease, stage 3b  -Follow with diuresis    Time spent in minutes: 35 DVT prophylaxis: Lovenox Code Status: DO NOT RESUSCITATE Family Communication:  Level of Care: Level of care: Med-Surg Disposition Plan:  Status is: Inpatient  Remains inpatient appropriate because:IV treatments appropriate due to intensity of illness or inability to take PO   Dispo:  Patient From: Home  Planned Disposition: [place]  Medically stable for discharge: No        Consultants:   Cardiology Procedures:   2D echo Antimicrobials:  Anti-infectives (From admission, onward)   None       Objective: Vitals:   06/10/20 0500 06/10/20 0539 06/10/20 0758 06/10/20 1128  BP: (!) 159/63  (!) 127/59 (!) 143/69  Pulse: 66  67 (!) 56  Resp:   18 20  Temp:   98.7 F (37.1 C) 97.9 F (36.6 C)  TempSrc:   Oral Oral  SpO2: 95%  97% 98%  Weight:  115.7 kg    Height:        Intake/Output Summary (Last 24 hours) at 06/10/2020 1342 Last data filed at 06/10/2020 0630 Gross per 24 hour  Intake 265.48 ml  Output 400 ml  Net -134.52 ml   Filed Weights   06/09/20 1400 06/10/20 0539  Weight: 125 kg 115.7 kg    Examination: General exam: Appears comfortable  HEENT: PERRLA,  oral mucosa moist, no sclera icterus or thrush Respiratory system: Crackles at bases respiratory effort normal. Cardiovascular system: S1 & S2 heard, RRR.   Gastrointestinal system: Abdomen soft, non-tender, nondistended. Normal bowel sounds. Central nervous system: Alert and oriented. No focal neurological deficits. Extremities: No cyanosis, clubbing 2+ pedal edema Skin: No rashes or ulcers Psychiatry:  Mood & affect appropriate.     Data Reviewed: I have personally reviewed following labs and imaging studies  CBC: Recent Labs  Lab 06/09/20 1400  06/10/20 0116  WBC 7.9 8.9  NEUTROABS 4.9  --   HGB 12.5 12.0  HCT 41.7 39.9  MCV 85.1 84.9  PLT 272 025   Basic Metabolic Panel: Recent Labs  Lab 06/09/20 1400 06/10/20 0116  NA 137 137  K 5.2* 4.7  CL 101 101  CO2 28 28  GLUCOSE 236* 236*  BUN 29* 28*  CREATININE 1.12* 1.17*  CALCIUM 8.5* 8.5*  MG  --  2.1  PHOS  --  4.3   GFR: Estimated Creatinine Clearance: 49.6 mL/min (A) (by C-G formula based on SCr of 1.17 mg/dL (H)). Liver Function Tests: Recent Labs  Lab 06/09/20 1400  AST 16  ALT 14  ALKPHOS 83  BILITOT 0.5  PROT 6.4*  ALBUMIN 2.9*   No results for input(s): LIPASE, AMYLASE in the last 168 hours. No results for input(s): AMMONIA in the last 168 hours. Coagulation Profile: Recent Labs  Lab 06/09/20 1400  INR 0.9   Cardiac Enzymes: No results for input(s): CKTOTAL, CKMB, CKMBINDEX, TROPONINI in the last 168 hours. BNP (last 3 results) No results for input(s): PROBNP in the last 8760 hours. HbA1C: Recent Labs    06/10/20 0116  HGBA1C 8.6*   CBG: Recent Labs  Lab 06/09/20 2038 06/10/20 0004 06/10/20 0458 06/10/20 0759 06/10/20 1129  GLUCAP 155* 247* 116* 170* 325*   Lipid Profile: Recent Labs    06/10/20 0116  CHOL 246*  HDL 53  LDLCALC 151*  TRIG 208*  CHOLHDL 4.6   Thyroid Function Tests: No results for input(s): TSH, T4TOTAL, FREET4, T3FREE, THYROIDAB in the last 72 hours. Anemia Panel: No results for input(s): VITAMINB12, FOLATE, FERRITIN, TIBC, IRON, RETICCTPCT in the last 72 hours. Urine analysis:    Component Value Date/Time   COLORURINE YELLOW (A) 06/09/2020 2050   APPEARANCEUR HAZY (A) 06/09/2020 2050   LABSPEC 1.008 06/09/2020 2050   PHURINE 6.0 06/09/2020 2050   GLUCOSEU 50 (A) 06/09/2020 2050   HGBUR NEGATIVE 06/09/2020 2050   [place] NEGATIVE 06/09/2020 2050   [place] 06/09/2020 2050   PROTEINUR 100 (A) 06/09/2020 2050   NITRITE POSITIVE (A) 06/09/2020 2050   LEUKOCYTESUR NEGATIVE 06/09/2020  2050   Sepsis Labs: @LABRCNTIP (procalcitonin:4,lacticidven:4) ) Recent Results (from the past 240 hour(s))  SARS CORONAVIRUS 2 (TAT 6-24 HRS) Nasopharyngeal Nasopharyngeal Swab     Status: None   Collection Time: 06/09/20  2:00 PM   Specimen: Nasopharyngeal Swab  Result Value Ref Range Status   SARS Coronavirus 2 NEGATIVE NEGATIVE Final    Comment: (NOTE) SARS-CoV-2 target nucleic acids are NOT DETECTED.  The SARS-CoV-2 RNA is generally detectable in upper and lower respiratory specimens during the acute phase of infection. Negative results do not preclude SARS-CoV-2 infection, do not rule out co-infections with other pathogens, and should not be used as the sole basis for treatment or other patient management decisions. Negative results must be combined with clinical observations, patient history, and epidemiological information. The expected result is Negative.  Fact Sheet for Patients:  SugarRoll.be  Fact Sheet for Healthcare Providers: https://www.woods-mathews.com/  This test is not yet approved or cleared by the [place] FDA and  has been authorized for detection and/or diagnosis of SARS-CoV-2 by FDA under an Emergency Use Authorization (EUA). This EUA will remain  in effect (meaning this test can be used) for the duration of the COVID-19 declaration under Se ction 564(b)(1) of the Act, 21 U.S.C. section 360bbb-3(b)(1), unless the authorization is terminated or revoked sooner.  Performed at [facility], [place] [address] [place], [place] [address]          Radiology Studies: DG Chest Port 1 View  Result Date: 06/09/2020 CLINICAL DATA:  Respiratory distress. EXAM: PORTABLE CHEST 1 VIEW COMPARISON:  Single-view of the chest 05/15/2020 and 12/08/2019. FINDINGS: There is pulmonary vascular congestion. No consolidative process, pneumothorax or effusion. Heart size is normal. Aortic atherosclerosis. No acute bony  abnormality. Severe degenerative disease about the shoulders noted. IMPRESSION: Pulmonary vascular congestion.  No focal abnormality. Aortic Atherosclerosis (ICD10-I70.0). Electronically Signed   By: Inge Rise M.D.   On: 06/09/2020 14:47   ECHOCARDIOGRAM COMPLETE  Result Date: 06/10/2020    ECHOCARDIOGRAM REPORT   Patient Name:   Karla Sanchez [facility] Date of Exam: 06/10/2020 Medical Rec #:  [id number]              Height:       66.0 in Accession #:    [id number]             Weight:       255.1 lb Date of Birth:  03-28-1940              BSA:          2.218 m Patient Age:    47 years               BP:           127/59 mmHg Patient Gender: F                      HR:           72 bpm. Exam Location:  [facility] Procedure: 2D Echo, Color Doppler, Cardiac Doppler and Strain Analysis Indications:     Elevated troponin  History:         Patient has prior history of Echocardiogram examinations, most                  recent 07/11/2019. CHF, COPD; Risk Factors:Hypertension and                  Diabetes.  Sonographer:     Charmayne Sheer RDCS (AE) Referring Phys:  [id number] [place] Diagnosing Phys: Kathlyn Sacramento MD  Sonographer Comments: Suboptimal apical window. Image acquisition challenging due to COPD. Global longitudinal strain was attempted. IMPRESSIONS  1. Left ventricular ejection fraction, by estimation, is 55 to 60%. The left ventricle has normal function. The left ventricle has no regional wall motion abnormalities. There is mild left ventricular hypertrophy. Left ventricular diastolic parameters are consistent with Grade I diastolic dysfunction (impaired relaxation).  2. Right ventricular systolic function is normal. The right ventricular size is normal. Tricuspid regurgitation signal is inadequate for assessing PA pressure.  3. Left atrial size was mildly dilated.  4. The mitral valve is normal in structure. Mild mitral valve regurgitation. No evidence of mitral stenosis.  5. The aortic valve is normal in  structure. Aortic valve regurgitation is not  visualized. Mild aortic valve stenosis. Aortic valve area, by VTI measures 1.95 cm. Aortic valve mean gradient measures 7.0 mmHg.  6. The inferior vena cava is dilated in size with >50% respiratory variability, suggesting right atrial pressure of 8 mmHg. FINDINGS  Left Ventricle: Left ventricular ejection fraction, by estimation, is 55 to 60%. The left ventricle has normal function. The left ventricle has no regional wall motion abnormalities. Global longitudinal strain performed but not reported based on interpreter judgement due to suboptimal tracking. The left ventricular internal cavity size was normal in size. There is mild left ventricular hypertrophy. Left ventricular diastolic parameters are consistent with Grade I diastolic dysfunction (impaired relaxation). Right Ventricle: The right ventricular size is normal. No increase in right ventricular wall thickness. Right ventricular systolic function is normal. Tricuspid regurgitation signal is inadequate for assessing PA pressure. Left Atrium: Left atrial size was mildly dilated. Right Atrium: Right atrial size was normal in size. Pericardium: There is no evidence of pericardial effusion. Mitral Valve: The mitral valve is normal in structure. Mild mitral annular calcification. Mild mitral valve regurgitation. No evidence of mitral valve stenosis. MV peak gradient, 4.7 mmHg. The mean mitral valve gradient is 2.0 mmHg. Tricuspid Valve: The tricuspid valve is normal in structure. Tricuspid valve regurgitation is not demonstrated. No evidence of tricuspid stenosis. Aortic Valve: The aortic valve is normal in structure. Aortic valve regurgitation is not visualized. Mild aortic stenosis is present. Aortic valve mean gradient measures 7.0 mmHg. Aortic valve peak gradient measures 11.3 mmHg. Aortic valve area, by VTI measures 1.95 cm. Pulmonic Valve: The pulmonic valve was normal in structure. Pulmonic valve regurgitation  is not visualized. No evidence of pulmonic stenosis. Aorta: The aortic root is normal in size and structure. Venous: The inferior vena cava is dilated in size with greater than 50% respiratory variability, suggesting right atrial pressure of 8 mmHg. IAS/Shunts: No atrial level shunt detected by color flow Doppler.  LEFT VENTRICLE PLAX 2D LVIDd:         5.40 cm  Diastology LVIDs:         3.80 cm  LV e' medial:    4.24 cm/s LV PW:         1.40 cm  LV E/e' medial:  17.6 LV IVS:        0.90 cm  LV e' lateral:   6.64 cm/s LVOT diam:     2.10 cm  LV E/e' lateral: 11.2 LV SV:         64 LV SV Index:   29 LVOT Area:     3.46 cm  RIGHT VENTRICLE RV Basal diam:  2.40 cm LEFT ATRIUM             Index       RIGHT ATRIUM           Index LA diam:        4.50 cm 2.03 cm/m  RA Area:     16.50 cm LA Vol (A2C):   29.0 ml 13.08 ml/m RA Volume:   42.30 ml  19.07 ml/m LA Vol (A4C):   43.3 ml 19.53 ml/m LA Biplane Vol: 37.3 ml 16.82 ml/m  AORTIC VALVE                    PULMONIC VALVE AV Area (Vmax):    1.88 cm     PV Vmax:       1.29 m/s AV Area (Vmean):   1.77 cm     PV  Vmean:      87.900 cm/s AV Area (VTI):     1.95 cm     PV VTI:        0.235 m AV Vmax:           168.00 cm/s  PV Peak grad:  6.7 mmHg AV Vmean:          121.000 cm/s PV Mean grad:  3.0 mmHg AV VTI:            0.327 m AV Peak Grad:      11.3 mmHg AV Mean Grad:      7.0 mmHg LVOT Vmax:         91.40 cm/s LVOT Vmean:        62.000 cm/s LVOT VTI:          0.184 m LVOT/AV VTI ratio: 0.56  AORTA Ao Root diam: 2.80 cm MITRAL VALVE MV Area (PHT): 5.23 cm     SHUNTS MV Area VTI:   2.59 cm     Systemic VTI:  0.18 m MV Peak grad:  4.7 mmHg     Systemic Diam: 2.10 cm MV Mean grad:  2.0 mmHg MV Vmax:       1.08 m/s MV Vmean:      64.0 cm/s MV Decel Time: 145 msec MV E velocity: 74.70 cm/s MV A velocity: 115.00 cm/s MV E/A ratio:  0.65 Kathlyn Sacramento MD Electronically signed by Kathlyn Sacramento MD Signature Date/Time: 06/10/2020/12:19:16 PM    Final       Scheduled  Meds: . aspirin EC  81 mg Oral Daily  . atorvastatin  20 mg Oral Daily  . ferrous sulfate  325 mg Oral Daily  . furosemide  40 mg Intravenous TID  . hydrALAZINE  10 mg Oral Q8H  . insulin aspart  0-15 Units Subcutaneous Q4H  . liver oil-zinc oxide   Topical BID  . metoprolol tartrate  25 mg Oral BID   Continuous Infusions:   LOS: 1 day      Debbe Odea, MD [facility] Pager: [email or web address] 06/10/2020, 1:42 PM

## 2020-06-10 NOTE — Progress Notes (Signed)
Initial Nutrition Assessment  DOCUMENTATION CODES:   Morbid obesity  INTERVENTION:  Provide Glucerna Shake po BID, each supplement provides 220 kcal and 10 grams of protein.  Diet handouts regarding low sodium, fluid restriction, diabetes diet placed in pt discharge instructions.   NUTRITION DIAGNOSIS:   Increased nutrient needs related to chronic illness (CHF) as evidenced by estimated needs.  GOAL:   Patient will meet greater than or equal to 90% of their needs  MONITOR:   PO intake,Supplement acceptance,Skin,Weight trends,Labs,I & O's  REASON FOR ASSESSMENT:   Consult,Malnutrition Screening Tool Diet education  ASSESSMENT:   80 y.o. 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 Pt admitted with acute on chronic diastolic heart failure.  Meal completion has been 100%. Pt currently on IV diuretics. MD suspects acute on chronic heart failure secondary to excess fluid drinking. Pt unavailable during attempted time of contact. RD to order nutritional supplements to aid in caloric and protein needs. RD additionally consulted for diet education reagarding, low sodium, fluid restriction, and diabetes diet. Handouts "Low sodium nutrition therapy" and "Heart Healthy nutrition therapy" from the Academy of Nutrition and Dietetics Manual was placed in pt discharge instructions. RD contact information provided.   Unable to complete Nutrition-Focused physical exam at this time.   Labs and medications reviewed.   Diet Order:   Diet Order            Diet heart healthy/carb modified Room service appropriate? Yes; Fluid consistency: Thin; Fluid restriction: 1200 mL Fluid  Diet effective now                 EDUCATION NEEDS:   Education needs  have been addressed  Skin:  Skin Assessment: Reviewed RN Assessment  Last BM:  3/20  Height:   Ht Readings from Last 1 Encounters:  06/09/20 5\' 6"  (1.676 m)    Weight:   Wt Readings from Last 1 Encounters:  06/10/20 115.7 kg   BMI:  Body mass index is 41.17 kg/m.  Estimated Nutritional Needs:   Kcal:  1850-2050  Protein:  100-115 grams  Fluid:  1.2 L/day  Corrin Parker, MS, RD, LDN RD pager number/after hours weekend pager number on Amion.

## 2020-06-10 NOTE — Discharge Instructions (Signed)
Heart Healthy, Consistent Carbohydrate Nutrition Therapy   A heart-healthy and consistent carbohydrate diet is recommended to manage heart disease and diabetes. To follow a heart-healthy and consistent carbohydrate diet, . Eat a balanced diet with whole grains, fruits and vegetables, and lean protein sources.  . Choose heart-healthy unsaturated fats. Limit saturated fats, trans fats, and cholesterol intake. Eat more plant-based or vegetarian meals using beans and soy foods for protein.  . Eat whole, unprocessed foods to limit the amount of sodium (salt) you eat.  . Choose a consistent amount of carbohydrate at each meal and snack. Limit refined carbohydrates especially sugar, sweets and sugar-sweetened beverages.  . If you drink alcohol, do so in moderation: one serving per day (women) and two servings per day (men). o One serving is equivalent to 12 ounces beer, 5 ounces wine, or 1.5 ounces distilled spirits  Tips Tips for Choosing Heart-Healthy Fats Choose lean protein and low-fat dairy foods to reduce saturated fat intake. . Saturated fat is usually found in animal-based protein and is associated with certain health risks. Saturated fat is the biggest contributor to raise low-density lipoprotein (LDL) cholesterol levels. Research shows that limiting saturated fat lowers unhealthy cholesterol levels. Eat no more than 7% of your total calories each day from saturated fat. Ask your RDN to help you determine how much saturated fat is right for you. . There are many foods that do not contain large amounts of saturated fats. Swapping these foods to replace foods high in saturated fats will help you limit the saturated fat you eat and improve your cholesterol levels. You can also try eating more plant-based or vegetarian meals. Instead of. Try:  Whole milk, cheese, yogurt, and ice cream 1% or skim milk, low-fat cheese, non-fat yogurt, and low-fat ice cream  Fatty, marbled beef and pork Lean beef, pork,  or venison  Poultry with skin Poultry without skin  Butter, stick margarine Reduced-fat, whipped, or liquid spreads  Coconut oil, palm oil Liquid vegetable oils: corn, canola, olive, soybean and safflower oils   Avoid foods that contain trans fats. . Trans fats increase levels of LDL-cholesterol. Hydrogenated fat in processed foods is the main source of trans fats in foods.  . Trans fats can be found in stick margarine, shortening, processed sweets, baked goods, some fried foods, and packaged foods made with hydrogenated oils. Avoid foods with "partially hydrogenated oil" on the ingredient list such as: cookies, pastries, baked goods, biscuits, crackers, microwave popcorn, and frozen dinners. Choose foods with heart healthy fats. . Polyunsaturated and monounsaturated fat are unsaturated fats that may help lower your blood cholesterol level when used in place of saturated fat in your diet. . Ask your RDN about taking a dietary supplement with plant sterols and stanols to help lower your cholesterol level. Marland Kitchen Research shows that substituting saturated fats with unsaturated fats is beneficial to cholesterol levels. Try these easy swaps: Instead of. Try:  Butter, stick margarine, or solid shortening Reduced-fat, whipped, or liquid spreads  Beef, pork, or poultry with skin Fish and seafood  Chips, crackers, snack foods Raw or unsalted nuts and seeds or nut butters Hummus with vegetables Avocado on toast  Coconut oil, palm oil Liquid vegetable oils: corn, canola, olive, soybean and safflower oils  Limit the amount of cholesterol you eat to less than 200 milligrams per day. . Cholesterol is a substance carried through the bloodstream via lipoproteins, which are known as "transporters" of fat. Some body functions need cholesterol to work properly, but too much  cholesterol in the bloodstream can damage arteries and build up blood vessel linings (which can lead to heart attack and stroke). You should eat  less than 200 milligrams cholesterol per day. Marland Kitchen People respond differently to eating cholesterol. There is no test available right now that can figure out which people will respond more to dietary cholesterol and which will respond less. For individuals with high intake of dietary cholesterol, different types of increase (none, small, moderate, large) in LDL-cholesterol levels are all possible.  . Food sources of cholesterol include egg yolks and organ meats such as liver, gizzards. Limit egg yolks to two to four per week and avoid organ meats like liver and gizzards to control cholesterol intake. Tips for Choosing Heart-Healthy Carbohydrates Consume a consistent amount of carbohydrate . It is important to eat foods with carbohydrates in moderation because they impact your blood glucose level. Carbohydrates can be found in many foods such as: . Grains (breads, crackers, rice, pasta, and cereals)  . Starchy Vegetables (potatoes, corn, and peas)  . Beans and legumes  . Milk, soy milk, and yogurt  . Fruit and fruit juice  . Sweets (cakes, cookies, ice cream, jam and jelly) . Your RDN will help you set a goal for how many carbohydrate servings to eat at your meals and snacks. For many adults, eating 3 to 5 servings of carbohydrate foods at each meal and 1 or 2 carbohydrate servings for each snack works well.  . Check your blood glucose level regularly. It can tell you if you need to adjust when you eat carbohydrates. . Choose foods rich in viscous (soluble) fiber . Viscous, or soluble, is found in the walls of plant cells. Viscous fiber is found only in plant-based foods. Eating foods with fiber helps to lower your unhealthy cholesterol and keep your blood glucose in range  . Rich sources of viscous fiber include vegetables (asparagus, Brussels sprouts, sweet potatoes, turnips) fruit (apricots, mangoes, oranges), legumes, and whole grains (barley, oats, and oat bran).  . As you increase your fiber  intake gradually, also increase the amount of water you drink. This will help prevent constipation.  . If you have difficulty achieving this goal, ask your RDN about fiber laxatives. Choose fiber supplements made with viscous fibers such as psyllium seed husks or methylcellulose to help lower unhealthy cholesterol.  . Limit refined carbohydrates  . There are three types of carbohydrates: starches, sugar, and fiber. Some carbohydrates occur naturally in food, like the starches in rice or corn or the sugars in fruits and milk. Refined carbohydrates--foods with high amounts of simple sugars--can raise triglyceride levels. High triglyceride levels are associated with coronary heart disease. . Some examples of refined carbohydrate foods are table sugar, sweets, and beverages sweetened with added sugar. Tips for Reducing Sodium (Salt) Although sodium is important for your body to function, too much sodium can be harmful for people with high blood pressure. As sodium and fluid buildup in your tissues and bloodstream, your blood pressure increases. High blood pressure may cause damage to other organs and increase your risk for a stroke. Even if you take a pill for blood pressure or a water pill (diuretic) to remove fluid, it is still important to have less salt in your diet. Ask your doctor and RDN what amount of sodium is right for you. Marland Kitchen Avoid processed foods. Eat more fresh foods.  . Fresh fruits and vegetables are naturally low in sodium, as well as frozen vegetables and fruits that have  no added juices or sauces.  . Fresh meats are lower in sodium than processed meats, such as bacon, sausage, and hotdogs. Read the nutrition label or ask your butcher to help you find a fresh meat that is low in sodium. . Eat less salt--at the table and when cooking.  . A single teaspoon of table salt has 2,300 mg of sodium.  . Leave the salt out of recipes for pasta, casseroles, and soups.  . Ask your RDN how to cook your  favorite recipes without sodium . Be a Paramedic.  . Look for food packages that say "salt-free" or "sodium-free." These items contain less than 5 milligrams of sodium per serving.  Marland Kitchen "Very low-sodium" products contain less than 35 milligrams of sodium per serving.  Marland Kitchen "Low-sodium" products contain less than 140 milligrams of sodium per serving.  . Beware for "Unsalted" or "No Added Salt" products. These items may still be high in sodium. Check the nutrition label. . Add flavors to your food without adding sodium.  . Try lemon juice, lime juice, fruit juice or vinegar.  . Dry or fresh herbs add flavor. Try basil, bay leaf, dill, rosemary, parsley, sage, dry mustard, nutmeg, thyme, and paprika.  . Pepper, red pepper flakes, and cayenne pepper can add spice t your meals without adding sodium. Hot sauce contains sodium, but if you use just a drop or two, it will not add up to much.  Sharyn Lull a sodium-free seasoning blend or make your own at home. Additional Lifestyle Tips Achieve and maintain a healthy weight. . Talk with your RDN or your doctor about what is a healthy weight for you. . Set goals to reach and maintain that weight.  . To lose weight, reduce your calorie intake along with increasing your physical activity. A weight loss of 10 to 15 pounds could reduce LDL-cholesterol by 5 milligrams per deciliter. Participate in physical activity. . Talk with your health care team to find out what types of physical activity are best for you. Set a plan to get about 30 minutes of exercise on most days.  Foods Recommended Food Group Foods Recommended  Grains Whole grain breads and cereals, including whole wheat, barley, rye, buckwheat, corn, teff, quinoa, millet, amaranth, brown or wild rice, sorghum, and oats Pasta, especially whole wheat or other whole grain types  [employer], quinoa or wild rice Whole grain crackers, bread, rolls, pitas Home-made bread with reduced-sodium baking soda  Protein  Foods Lean cuts of beef and pork (loin, leg, round, extra lean hamburger)  Skinless Cytogeneticist and other wild game Dried beans and peas Nuts and nut butters Meat alternatives made with soy or textured vegetable protein  Egg whites or egg substitute Cold cuts made with lean meat or soy protein  Dairy Nonfat (skim), low-fat, or 1%-fat milk  Nonfat or low-fat yogurt or cottage cheese Fat-free and low-fat cheese  Vegetables Fresh, frozen, or canned vegetables without added fat or salt   Fruits Fresh, frozen, canned, or dried fruit   Oils Unsaturated oils (corn, olive, peanut, soy, sunflower, canola)  Soft or liquid margarines and vegetable oil spreads  Salad dressings Seeds and nuts  Avocado   Foods Not Recommended Food Group Foods Not Recommended  Grains Breads or crackers topped with salt Cereals (hot or cold) with more than 300 mg sodium per serving Biscuits, cornbread, and other "quick" breads prepared with baking soda Bread crumbs or stuffing mix from a store High-fat bakery products, such as  doughnuts, biscuits, croissants, danish pastries, pies, cookies Instant cooking foods to which you add hot water and stir--potatoes, noodles, rice, etc. Packaged starchy foods--seasoned noodle or rice dishes, stuffing mix, macaroni and cheese dinner Snacks made with partially hydrogenated oils, including chips, cheese puffs, snack mixes, regular crackers, butter-flavored popcorn  Protein Foods Higher-fat cuts of meats (ribs, t-bone steak, regular hamburger) Bacon, sausage, or hot dogs Cold cuts, such as salami or bologna, deli meats, cured meats, corned beef Organ meats (liver, brains, gizzards, sweetbreads) Poultry with skin Fried or smoked meat, poultry, and fish Whole eggs and egg yolks (more than 2-4 per week) Salted legumes, nuts, seeds, or nut/seed butters Meat alternatives with high levels of sodium (>300 mg per serving) or saturated fat (>5 g per serving)  Dairy Whole  milk,?2% fat milk, buttermilk Whole milk yogurt or ice cream Cream Half-&-half Cream cheese Sour cream Cheese  Vegetables Canned or frozen vegetables with salt, fresh vegetables prepared with salt, butter, cheese, or cream sauce Fried vegetables Pickled vegetables such as olives, pickles, or sauerkraut  Fruits Fried fruits Fruits served with butter or cream  Oils Butter, stick margarine, shortening Partially hydrogenated oils or trans fats Tropical oils (coconut, palm, palm kernel oils)  Other Candy, sugar sweetened soft drinks and desserts Salt, sea salt, garlic salt, and seasoning mixes containing salt Bouillon cubes Ketchup, barbecue sauce, Worcestershire sauce, soy sauce, teriyaki sauce Miso Salsa Pickles, olives, relish   Heart Healthy Consistent Carbohydrate Vegetarian (Lacto-Ovo) Sample 1-Day Menu  Breakfast 1 cup oatmeal, cooked (2 carbohydrate servings)   cup blueberries (1 carbohydrate serving)  11 almonds, without salt  1 cup 1% milk (1 carbohydrate serving)  1 cup coffee  Morning Snack 1 cup fat-free plain yogurt (1 carbohydrate serving)  Lunch 1 whole wheat bun (1 carbohydrate servings)  1 black bean burger (1 carbohydrate servings)  1 slice cheddar cheese, low sodium  2 slices tomatoes  2 leaves lettuce  1 teaspoon mustard  1 small pear (1 carbohydrate servings)  1 cup green tea, unsweetened  Afternoon Snack 1/3 cup trail mix with nuts, seeds, and raisins, without salt (1 carbohydrate servinga)  Evening Meal  cup meatless chicken  2/3 cup brown rice, cooked (2 carbohydrate servings)  1 cup broccoli, cooked (2/3 carbohydrate serving)   cup carrots, cooked (1/3 carbohydrate serving)  2 teaspoons olive oil  1 teaspoon balsamic vinegar  1 whole wheat dinner roll (1 carbohydrate serving)  1 teaspoon margarine, soft, tub  1 cup 1% milk (1 carbohydrate serving)  Evening Snack 1 extra small banana (1 carbohydrate serving)  1 tablespoon peanut butter    Heart Healthy Consistent Carbohydrate Vegan Sample 1-Day Menu  Breakfast 1 cup oatmeal, cooked (2 carbohydrate servings)   cup blueberries (1 carbohydrate serving)  11 almonds, without salt  1 cup soymilk fortified with calcium, vitamin B12, and vitamin D  1 cup coffee  Morning Snack 6 ounces soy yogurt (1 carbohydrate servings)  Lunch 1 whole wheat bun(1 carbohydrate servings)  1 black bean burger (1 carbohydrate serving)  2 slices tomatoes  2 leaves lettuce  1 teaspoon mustard  1 small pear (1 carbohydrate servings)  1 cup green tea, unsweetened  Afternoon Snack 1/3 cup trail mix with nuts, seeds, and raisins, without salt (1 carbohydrate servings)  Evening Meal  cup meatless chicken  2/3 cup brown rice, cooked (2 carbohydrate servings)  1 cup broccoli, cooked (2/3 carbohydrate serving)   cup carrots, cooked (1/3 carbohydrate serving)  2 teaspoons olive oil  1  teaspoon balsamic vinegar  1 whole wheat dinner roll (1 carbohydrate serving)  1 teaspoon margarine, soft, tub  1 cup soymilk fortified with calcium, vitamin B12, and vitamin D  Evening Snack 1 extra small banana (1 carbohydrate serving)  1 tablespoon peanut butter    Heart Healthy Consistent Carbohydrate Sample 1-Day Menu  Breakfast 1 cup cooked oatmeal (2 carbohydrate servings)  3/4 cup blueberries (1 carbohydrate serving)  1 ounce almonds  1 cup skim milk (1 carbohydrate serving)  1 cup coffee  Morning Snack 1 cup sugar-free nonfat yogurt (1 carbohydrate serving)  Lunch 2 slices whole-wheat bread (2 carbohydrate servings)  2 ounces lean [place] breast  1 ounce low-fat Swiss cheese  1 teaspoon mustard  1 slice tomato  1 lettuce leaf  1 small pear (1 carbohydrate serving)  1 cup skim milk (1 carbohydrate serving)  Afternoon Snack 1 ounce trail mix with unsalted nuts, seeds, and raisins (1 carbohydrate serving)  Evening Meal 3 ounces salmon  2/3 cup cooked brown rice (2 carbohydrate servings)  1 teaspoon  soft margarine  1 cup cooked broccoli with 1/2 cup cooked carrots (1 carbohydrate serving  Carrots, cooked, boiled, drained, without salt  1 cup lettuce  1 teaspoon olive oil with vinegar for dressing  1 small whole grain roll (1 carbohydrate serving)  1 teaspoon soft margarine  1 cup unsweetened tea  Evening Snack 1 extra-small banana (1 carbohydrate serving)  Copyright 2020  [employer]. All rights reserved.  Low Sodium Nutrition Therapy  Eating less sodium can help you if you have high blood pressure, heart failure, or kidney or liver disease.   Your body needs a little sodium, but too much sodium can cause your body to hold onto extra water. This extra water will raise your blood pressure and can cause damage to your heart, kidneys, or liver as they are forced to work harder.   Sometimes you can see how the extra fluid affects you because your hands, legs, or belly swell. You may also hold water around your heart and lungs, which makes it hard to breathe.   Even if you take medication for blood pressure or a water pill (diuretic) to remove fluid, it is still important to have less salt in your diet.   Check with your primary care provider before drinking alcohol since it may affect the amount of fluid in your body and how your heart, kidneys, or liver work. Sodium in Food A low-sodium meal plan limits the sodium that you get from food and beverages to 1,500-2,000 milligrams (mg) per day. Salt is the main source of sodium. Read the nutrition label on the package to find out how much sodium is in one serving of a food.  . Select foods with 140 milligrams (mg) of sodium or less per serving.  . You may be able to eat one or two servings of foods with a little more than 140 milligrams (mg) of sodium if you are closely watching how much sodium you eat in a day.  . Check the serving size on the label. The amount of sodium listed on the label shows the amount in one  serving of the food. So, if you eat more than one serving, you will get more sodium than the amount listed.  Tips Cutting Back on Sodium . Eat more fresh foods.  . Fresh fruits and vegetables are low in sodium, as well as frozen vegetables and fruits that have no added juices or  sauces.  . Fresh meats are lower in sodium than processed meats, such as bacon, sausage, and hotdogs.  . Not all processed foods are unhealthy, but some processed foods may have too much sodium.  . Eat less salt at the table and when cooking. One of the ingredients in salt is sodium.  . One teaspoon of table salt has 2,300 milligrams of sodium.  . Leave the salt out of recipes for pasta, casseroles, and soups. . Be a smart shopper.  . Food packages that say "Salt-free", sodium-free", "very low sodium," and "low sodium" have less than 140 milligrams of sodium per serving.  . Beware of products identified as "Unsalted," "No Salt Added," "Reduced Sodium," or "Lower Sodium." These items may still be high in sodium. You should always check the nutrition label. . Add flavors to your food without adding sodium.  . Try lemon juice, lime juice, or vinegar.  . Dry or fresh herbs add flavor.  Sharyn Lull a sodium-free seasoning blend or make your own at home. . You can purchase salt-free or sodium-free condiments like barbeque sauce in stores and online. Ask your registered dietitian nutritionist for recommendations and where to find them.  .  Eating in Restaurants . Choose foods carefully when you eat outside your home. Restaurant foods can be very high in sodium. Many restaurants provide nutrition facts on their menus or their websites. If you cannot find that information, ask your server. Let your server know that you want your food to be cooked without salt and that you would like your salad dressing and sauces to be served on the side.  .   . Foods Recommended . Food Group . Foods Recommended  . Grains . Bread, bagels, rolls  without salted tops Homemade bread made with reduced-sodium baking powder Cold cereals, especially shredded wheat and puffed rice Oats, grits, or cream of wheat Pastas, quinoa, and rice Popcorn, pretzels or crackers without salt Corn tortillas  . Protein Foods . Fresh meats and fish; [place] bacon (check the nutrition labels - make sure they are not packaged in a sodium solution) Canned or packed tuna (no more than 4 ounces at 1 serving) Beans and peas Soybeans) and tofu Eggs Nuts or nut butters without salt  . Dairy . Milk or milk powder Plant milks, such as rice and soy Yogurt, including Greek yogurt Small amounts of natural cheese (blocks of cheese) or reduced-sodium cheese can be used in moderation. (Swiss, ricotta, and fresh mozzarella cheese are lower in sodium than the others) Cream Cheese Low sodium cottage cheese  . Vegetables . Fresh and frozen vegetables without added sauces or salt Homemade soups (without salt) Low-sodium, salt-free or sodium-free canned vegetables and soups  . Fruit . Fresh and canned fruits Dried fruits, such as raisins, cranberries, and prunes  . Oils . Tub or liquid margarine, regular or without salt Canola, corn, peanut, olive, safflower, or sunflower oils  . Condiments . Fresh or dried herbs such as basil, bay leaf, dill, mustard (dry), nutmeg, paprika, parsley, rosemary, sage, or thyme.  Low sodium ketchup Vinegar  Lemon or lime juice Pepper, red pepper flakes, and cayenne. Hot sauce contains sodium, but if you use just a drop or two, it will not add up to much.  Salt-free or sodium-free seasoning mixes and marinades Simple salad dressings: vinegar and oil  .  Marland Kitchen Foods Not Recommended . Food Group . Foods Not Recommended  . Grains . Breads or crackers topped with salt Cereals (hot/cold)  with more than 300 mg sodium per serving Biscuits, cornbread, and other "quick" breads prepared with baking soda Pre-packaged bread crumbs Seasoned and  packaged rice and pasta mixes Self-rising flours  . Protein Foods . Cured meats: Bacon, ham, sausage, pepperoni and hot dogs Canned meats (chili, vienna sausage, or sardines) Smoked fish and meats Frozen meals that have more than 600 mg of sodium per serving Egg substitute (with added sodium)  . Dairy . Buttermilk Processed cheese spreads Cottage cheese (1 cup may have over 500 mg of sodium; look for low-sodium.) American or feta cheese Shredded Cheese has more sodium than blocks of cheese String cheese  . Vegetables . Canned vegetables (unless they are salt-free, sodium-free or low sodium) Frozen vegetables with seasoning and sauces Sauerkraut and pickled vegetables Canned or dried soups (unless they are salt-free, sodium-free, or low sodium) [place] fries and onion rings  . Fruit . Dried fruits preserved with additives that have sodium  . Oils . Salted butter or margarine, all types of olives  . Condiments . Salt, sea salt, kosher salt, onion salt, and garlic salt Seasoning mixes with salt Bouillon cubes Ketchup Barbeque sauce and Worcestershire sauce unless low sodium Soy sauce Salsa, pickles, olives, relish Salad dressings: ranch, blue cheese, [place], and [place].  .  . Low Sodium Sample 1-Day Menu  . Breakfast . 1 cup cooked oatmeal  . 1 slice whole wheat bread toast  . 1 tablespoon peanut butter without salt  . 1 banana  . 1 cup 1% milk  . Lunch . Tacos made with: 2 corn tortillas  .  cup black beans, low sodium  .  cup roasted or grilled chicken (without skin)  .  avocado  . Squeeze of lime juice  . 1 cup salad greens  . 1 tablespoon low-sodium salad dressing  .  cup strawberries  . 1 orange  . Afternoon Snack . 1/3 cup grapes  . 6 ounces yogurt  . Evening Meal . 3 ounces herb-baked fish  . 1 baked potato  . 2 teaspoons olive oil  .  cup cooked carrots  . 2 thick slices tomatoes on:  . 2 lettuce leaves  . 1 teaspoon olive oil  . 1 teaspoon balsamic  vinegar  . 1 cup 1% milk  . Evening Snack . 1 apple  .  cup almonds without salt  .  Marland Kitchen Low-Sodium Vegetarian (Lacto-Ovo) Sample 1-Day Menu  . Breakfast . 1 cup cooked oatmeal  . 1 slice whole wheat toast  . 1 tablespoon peanut butter without salt  . 1 banana  . 1 cup 1% milk  . Lunch . Tacos made with: 2 corn tortillas  .  cup black beans, low sodium  .  cup roasted or grilled chicken (without skin)  .  avocado  . Squeeze of lime juice  . 1 cup salad greens  . 1 tablespoon low-sodium salad dressing  .  cup strawberries  . 1 orange  . Evening Meal . Stir fry made with:  cup tofu  . 1 cup brown rice  .  cup broccoli  .  cup green beans  .  cup peppers  .  tablespoon peanut oil  . 1 orange  . 1 cup 1% milk  . Evening Snack . 4 strips celery  . 2 tablespoons hummus  . 1 hard-boiled egg  .  Marland Kitchen Low-Sodium Vegan Sample 1-Day Menu  . Breakfast . 1 cup cooked oatmeal  . 1 tablespoon peanut butter without  salt  . 1 cup blueberries  . 1 cup soymilk fortified with calcium, vitamin B12, and vitamin D  . Lunch . 1 small whole wheat pita  .  cup cooked lentils  . 2 tablespoons hummus  . 4 carrot sticks  . 1 medium apple  . 1 cup soymilk fortified with calcium, vitamin B12, and vitamin D  . Evening Meal . Stir fry made with:  cup tofu  . 1 cup brown rice  .  cup broccoli  .  cup green beans  .  cup peppers  .  tablespoon peanut oil  . 1 cup cantaloupe  . Evening Snack . 1 cup soy yogurt  .  cup mixed nuts  . Copyright 2020  Academy of Nutrition and Dietetics. All rights reserved .  Marland Kitchen Sodium Free Flavoring Tips .  Marland Kitchen When cooking, the following items may be used for flavoring instead of salt or seasonings that contain sodium. . Remember: A little bit of spice goes a long way! Be careful not to overseason. Marland Kitchen Spice Blend Recipe (makes about ? cup) . 5 teaspoons onion powder  . 2 teaspoons garlic powder  . 2 teaspoons paprika  . 2 teaspoon dry mustard   . 1 teaspoon crushed thyme leaves  .  teaspoon white pepper  .  teaspoon celery seed Food Item Flavorings  Beef Basil, bay leaf, caraway, curry, dill, dry mustard, garlic, grape jelly, green pepper, mace, marjoram, mushrooms (fresh), nutmeg, onion or onion powder, parsley, pepper, rosemary, sage  Chicken Basil, cloves, cranberries, mace, mushrooms (fresh), nutmeg, oregano, paprika, parsley, pineapple, saffron, sage, savory, tarragon, thyme, tomato, turmeric  Egg Chervil, curry, dill, dry mustard, garlic or garlic powder, green pepper, jelly, mushrooms (fresh), nutmeg, onion powder, paprika, parsley, rosemary, tarragon, tomato  Fish Basil, bay leaf, chervil, curry, dill, dry mustard, green pepper, lemon juice, marjoram, mushrooms (fresh), paprika, pepper, tarragon, tomato, turmeric  Lamb Cloves, curry, dill, garlic or garlic powder, mace, mint, mint jelly, onion, oregano, parsley, pineapple, rosemary, tarragon, thyme  Pork Applesauce, basil, caraway, chives, cloves, garlic or garlic powder, onion or onion powder, rosemary, thyme  Veal Apricots, basil, bay leaf, currant jelly, curry, ginger, marjoram, mushrooms (fresh), oregano, paprika  Vegetables Basil, dill, garlic or garlic powder, ginger, lemon juice, mace, marjoram, nutmeg, onion or onion powder, tarragon, tomato, sugar or sugar substitute, salt-free salad dressing, vinegar  Desserts Allspice, anise, cinnamon, cloves, ginger, mace, nutmeg, vanilla extract, other extracts   Copyright 2020  Academy of Nutrition and Dietetics. All rights reserved  Fluid Restricted Nutrition Therapy  You have been prescribed this diet because your condition affects how much fluid you can eat or drink. If your heart, liver, or kidneys aren't working properly, you may not be able to effectively eliminate fluids from the body and this may cause swelling (edema) in the legs, arms, and/or stomach. Drink no more than ____1200_____ liters or ____40____ ounces or  ___5_____cups of fluid per day.  . You don't need to stop eating or drinking the same fluids you normally would, but you may need to eat or drink less than usual.  . Your registered dietitian nutritionist will help you determine the correct amount of fluid to consume during the day Breakfast Include fluids taken with medications  Lunch Include fluids taken with medications  Dinner Include fluids taken with medications  Bedtime Snack Include fluids taken with medications     Tips What Are Fluids?  A fluid is anything that is liquid or anything that would  melt if left at room temperature. You will need to count these foods and liquids--including any liquid used to take medication--as part of your daily fluid intake. Some examples are: . Alcohol (drink only with your doctor's permission)  . Coffee, tea, and other hot beverages  . Gelatin (Jell-O)  . Gravy  . Ice cream, sherbet, sorbet  . Ice cubes, ice chips  . Milk, liquid creamer  . Nutritional supplements  . Popsicles  . Vegetable and fruit juices; fluid in canned fruit  . Watermelon  . Yogurt  . Soft drinks, lemonade, limeade  . Soups  . Syrup How Do I Measure My Fluid Intake? Marland Kitchen Record your fluid intake daily.  . Tip: Every day, each time you eat or drink fluids, pour water in the same amount into an empty container that can hold the same amount of fluids you are allowed daily. This may help you keep track of how much fluid you are taking in throughout the day.  . To accurately keep track of how much liquid you take in, measure the size of the cups, glasses, and bowls you use. If you eat soup, measure how much of it is liquid and how much is solid (such as noodles, vegetables, meat). Conversions for Measuring Fluid Intake  Milliliters (mL) Liters (L) Ounces (oz) Cups (c)  1000 1 32 4  1200 1.2 40 5  1500 1.5 50 6 1/4  1800 1.8 60 7 1/2  2000 2 67 8 1/3  Tips to Reduce Your Thirst . Chew gum or suck on hard candy.  . Rinse  or gargle with mouthwash. Do not swallow.  . Ice chips or popsicles my help quench thirst, but this too needs to be calculated into the total restriction. Melt ice chips or cubes first to figure out how much fluid they produce (for example, experiment with melting  cup ice chips or 2 ice cubes).  . Add a lemon wedge to your water.  . Limit how much salt you take in. A high salt intake might make you thirstier.  . Don't eat or drink all your allowed liquids at once. Space your liquids out through the day.  . Use small glasses and cups and sip slowly. If allowed, take your medications with fluids you eat or drink during a meal.   Fluid-Restricted Nutrition Therapy Sample 1-Day Menu  Breakfast 1 slice wheat toast  1 tablespoon peanut butter  1/2 cup yogurt (120 milliliters)  1/2 cup blueberries  1 cup milk (240 milliliters)   Lunch 3 ounces sliced [place]  2 slices whole wheat bread  1/2 cup lettuce for sandwich  2 slices tomato for sandwich  1 ounce reduced-fat, reduced-sodium cheese  1/2 cup fresh carrot sticks  1 banana  1 cup unsweetened tea (240 milliliters)   Evening Meal 8 ounces soup (240 milliliters)  3 ounces salmon  1/2 cup quinoa  1 cup green beans  1 cup mixed greens salad  1 tablespoon olive oil  1 cup coffee (240 milliliters)  Evening Snack 1/2 cup sliced peaches  1/2 cup frozen yogurt (120 milliliters)  1 cup water (240 milliliters)  Copyright 2020  Academy of Nutrition and Dietetics. All rights reserved  Corrin Parker, MS, RD, LDN Clinical Dietitian Office phone [phone]

## 2020-06-10 NOTE — Progress Notes (Signed)
*  PRELIMINARY RESULTS* Echocardiogram 2D Echocardiogram has been performed.  Karla Sanchez 06/10/2020, 11:17 AM

## 2020-06-10 NOTE — TOC Initial Note (Signed)
Transition of Care [facility]) - Initial/Assessment Note    Patient Details  Name: Karla Sanchez MRN: [id number] Date of Birth: 08-04-1940  [facility] of [facility]) CM/SW Contact:    Kerin Salen, RN Phone Number: 06/10/2020, 2:08 PM  Clinical Narrative:  Spoke with patient who voices living in the [place], [place] for a year due to death of husband and sister did not want to return to her home in [place]. Preferred to come here, lived here, in [place]. Have a handicapped equipped [place] and can drive if needed. While in the [place], breakfast is free and she can call for food to be delivered to room from various local restaurants. Non-ambulatory use wheelchair and motorized wheel chair. No family to call, daughter lives in [place] with two grandsons. Patient states she has paid for her funeral plot at [place]. Patient PCP, Dr. Buck Mam wrote refills however no visit in 3 years. Patient receptive to SNF and says without a support system she understands that she can not continue to live in a hotel. Prefers to go to local SNF because she does have a [place] in the area. Patient understands that I will submitt bed search and follow up with results and selection.                 Expected Discharge Plan: [place] Barriers to Discharge: Continued Medical Work up   Patient Goals and CMS Choice Patient states their goals for this hospitalization and ongoing recovery are:: I prefer to go to Nursing Home, around here will be ok. CMS Medicare.gov Compare Post Acute Care list provided to:: Patient Choice offered to / list presented to : Patient  Expected Discharge Plan and Services Expected Discharge Plan: [place] In-house Referral: Clinical Social Work Discharge Planning Services: NA Post Acute Care Choice: [place] Living arrangements for the past 2 months: Hotel/Motel                 DME Arranged: N/A DME Agency:  NA       HH Arranged: NA [place] Agency: NA        Prior Living Arrangements/Services Living arrangements for the past 2 months: Hotel/Motel Lives with:: Self Patient language and need for interpreter reviewed:: Yes Do you feel safe going back to the place where you live?: Yes      Need for Family Participation in Patient Care: No (Comment) Care giver support system in place?: No (comment)   Criminal Activity/Legal Involvement Pertinent to Current Situation/Hospitalization: No - Comment as needed  Activities of Daily Living Home Assistive Devices/Equipment: Wheelchair ADL Screening (condition at time of admission) Patient's cognitive ability adequate to safely complete daily activities?: Yes Is the patient deaf or have difficulty hearing?: No Does the patient have difficulty seeing, even when wearing glasses/contacts?: No Does the patient have difficulty concentrating, remembering, or making decisions?: No Patient able to express need for assistance with ADLs?: Yes Does the patient have difficulty dressing or bathing?: Yes Independently performs ADLs?: Yes (appropriate for developmental age) Does the patient have difficulty walking or climbing stairs?: Yes Weakness of Legs: Both Weakness of Arms/Hands: None  Permission Sought/Granted Permission sought to share information with : Case Manager Permission granted to share information with : No              Emotional Assessment Appearance:: Appears stated age Attitude/Demeanor/Rapport: Engaged Affect (typically observed): Accepting Orientation: : Oriented to Self,Oriented to Place,Oriented to  Time,Oriented  to Situation Alcohol / Substance Use: Not Applicable Psych Involvement: No (comment)  Admission diagnosis:  COPD (chronic obstructive pulmonary disease) (HCC) [J44.9] CHF (congestive heart failure) (HCC) [I50.9] SOB (shortness of breath) [R06.02] Acute exacerbation of CHF (congestive heart failure) ([place])  [I50.9] Patient Active Problem List   Diagnosis Date Noted  . Acute on chronic diastolic heart failure ([place]) 04/25/2020  . SOB (shortness of breath) 04/25/2020  . Elevated troponin 04/25/2020  . B12 deficiency   . Short-term memory loss   . Weakness   . Fungal skin infection   . Obesity (BMI 30-39.9)   . Cellulitis of right leg 01/04/2020  . COPD with acute exacerbation ([place]) 12/08/2019  . Physical deconditioning 12/08/2019  . Acute respiratory failure with hypoxia ([place])   . Iron deficiency anemia   . Type 2 diabetes mellitus without complication, with long-term current use of insulin ([place])   . CHF exacerbation ([place]) 11/20/2019  . Acute on chronic diastolic (congestive) heart failure ([place]) 11/19/2019  . Acute pulmonary edema (HCC)   . Acute on chronic diastolic CHF (congestive heart failure) ([place]) 10/22/2019  . Cellulitis of right foot 10/22/2019  . Non compliance with medical treatment   . Chronic respiratory failure with hypoxia ([place])   . Hypertension   . Chronic kidney disease, stage 3b ([place])   . Acute on chronic respiratory failure with hypoxia ([place])   . Diarrhea   . COPD (chronic obstructive pulmonary disease) ([place]) 10/03/2019  . Obesity, Class III, BMI 40-49.9 (morbid obesity) ([place]) 10/03/2019  . Chronic diastolic CHF (congestive heart failure) ([place]) 07/09/2019  . DM2 (diabetes mellitus, type 2) ([place]) 07/09/2019  . Peripheral edema 07/09/2019  . Gout 07/09/2019  . Obesity, diabetes, and hypertension syndrome ([place]) 07/09/2019  . Anemia of chronic disease 07/09/2019   PCP:  Alfredia Client, MD Pharmacy:   [facility] [address] [place] [address] Phone: [phone] Fax: [phone]  [place], [place] [address] [place] [address] Phone: [phone] Fax: [phone]     Social Determinants of Health  (SDOH) Interventions    Readmission Risk Interventions Readmission Risk Prevention Plan 06/10/2020 10/24/2019  Transportation Screening Complete -  Medication Review ([place]) Complete Complete  PCP or Specialist appointment within 3-5 days of discharge Complete -  [place] or Home Care Consult Complete Complete  SW Recovery Care/Counseling Consult Complete -  Palliative Care Screening Not Applicable -  [place] Complete Patient Refused

## 2020-06-10 NOTE — Consult Note (Signed)
[facility] Consultation Note  Patient ID: Karla Sanchez, MRN: [id number], DOB/AGE: 10-29-1940 80 y.o. Admit date: 06/09/2020   Date of Consult: 06/10/2020 Primary Physician: Alfredia Client, MD Primary Cardiologist: None  Chief Complaint:  Chief Complaint  Patient presents with  . Shortness of Breath   Reason for Consult: Shortness of breath with heart failure  HPI: 80 y.o. female with known chronic obstructive pulmonary disease with exacerbation chronic diastolic dysfunction congestive heart failure chronic kidney disease stage III with progression of significant lower extremity edema pulmonary edema shortness of breath PND and orthopnea over the last week.  The patient has had the symptoms and issues in the recent past and has been followed by the congestive heart failure clinic.  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  Troponin is 306/288/236 most consistent with demand ischemia rather than acute coronary syndrome.  Glomerular filtration rate is 47 and BNP is 551.  Chest x-ray shows pulmonary vascular congestion consistent with pulmonary edema.  The patient does have hypoxia which she has had improvements of symptoms with oxygen supplementation.  Currently the patient has had some urine output with furosemide and the patient is more comfortable at this time without evidence of anginal symptoms.  Past Medical History:  Diagnosis Date  . CHF (congestive heart failure) ([place])   . Colon cancer [facility])    s/p right and left colectomy 2020  . COPD (chronic obstructive pulmonary disease) ([place])   . Diabetes mellitus without complication ([place])   . Diastolic heart failure ([place])   . Gout   . Hypertension       Surgical History:  Past Surgical History:  Procedure Laterality Date  . COLON SURGERY    . TUBAL LIGATION  1970     Home Meds: Prior  to Admission medications   Medication Sig Start Date End Date Taking? Authorizing Provider  albuterol (VENTOLIN HFA) 108 (90 Base) MCG/ACT inhaler Inhale 2 puffs into the lungs every 6 (six) hours as needed for wheezing or shortness of breath. 01/09/20  Yes Loletha Grayer, MD  aspirin EC 81 MG tablet Take 1 tablet (81 mg total) by mouth daily. Swallow whole. 01/09/20  Yes Wieting, Richard, MD  docusate sodium (COLACE) 100 MG capsule Take 1 capsule (100 mg total) by mouth at bedtime. 01/09/20  Yes Wieting, Richard, MD  ferrous sulfate 325 (65 FE) MG tablet Take 1 tablet (325 mg total) by mouth daily. 01/09/20  Yes Wieting, Richard, MD  insulin glargine (LANTUS) 100 UNIT/ML Solostar Pen Inject 10 Units into the skin daily. Patient taking differently: Inject 100 Units into the skin daily. 01/09/20  Yes Wieting, Richard, MD  insulin lispro (HUMALOG) 100 UNIT/ML KwikPen Inject 3 Units into the skin 3 (three) times daily before meals. 01/09/20  Yes Wieting, Richard, MD  Insulin Pen Needle 31G X 8 MM MISC 1 Dose by Does not apply route 3 (three) times daily before meals. 01/09/20  Yes Wieting, Richard, MD  ipratropium-albuterol (DUONEB) 0.5-2.5 (3) MG/3ML SOLN Take 3 mLs by nebulization every 6 (six) hours as needed. 01/09/20  Yes Wieting, Richard, MD  metoprolol tartrate (LOPRESSOR) 25 MG tablet Take 0.5 tablets (12.5 mg total) by mouth 2 (two) times daily. 01/09/20  Yes Wieting, Richard, MD  potassium chloride (KLOR-CON) 10 MEQ tablet Take 1 tablet (10 mEq total) by mouth daily. 01/09/20 01/08/21 Yes Loletha Grayer, MD  traZODone (  DESYREL) 50 MG tablet Take 1 tablet (50 mg total) by mouth at bedtime as needed for sleep. 01/09/20  Yes Wieting, Richard, MD  acetaminophen (TYLENOL) 325 MG tablet Take 650 mg by mouth every 6 (six) hours as needed for mild pain or fever.     [provider]  colchicine 0.6 MG tablet Take 0.6 mg by mouth daily as needed (acute gout flare).     [provider]  furosemide (LASIX) 40 MG tablet Take 1 tablet (40 mg total) by mouth 2 (two) times daily. 01/09/20   Loletha Grayer, MD  LEVEMIR 100 UNIT/ML injection SMARTSIG:15 Unit(s) SUB-Q Every Night 03/08/20   [provider]  liver oil-zinc oxide (DESITIN) 40 % ointment Apply 1 application topically in the morning and at bedtime. Uses on buttock 01/09/20   Loletha Grayer, MD  mirtazapine (REMERON) 15 MG tablet Take 1 tablet (15 mg total) by mouth at bedtime. 01/09/20   Loletha Grayer, MD  nystatin (MYCOSTATIN/NYSTOP) powder Apply topically 2 (two) times daily. 01/09/20   Loletha Grayer, MD    Inpatient Medications:  . aspirin EC  81 mg Oral Daily  . atorvastatin  20 mg Oral Daily  . ferrous sulfate  325 mg Oral Daily  . furosemide  40 mg Intravenous TID  . hydrALAZINE  10 mg Oral Q8H  . insulin aspart  0-15 Units Subcutaneous Q4H  . liver oil-zinc oxide   Topical BID  . metoprolol tartrate  12.5 mg Oral BID   . heparin 1,550 Units/hr (06/10/20 0522)    Allergies:  Allergies  Allergen Reactions  . Codeine Nausea Only, Other (See Comments) and Swelling    Other Reaction: nausea and vomiting Other reaction(s): NAUSEA   . Other Other (See Comments)    Other reaction(s): SWELLING  Other reaction(s): MUSCLE PAIN Muscle aches Other reaction(s): MUSCLE PAIN Muscle aches Other reaction(s): MUSCLE PAIN Muscle aches   . Exenatide Nausea And Vomiting  . Levofloxacin Other (See Comments)    Other Reaction: sloughing of buccal mucosa Other reaction(s): OTHER Lost skin in mouth Patient states she can take Cipro without difficulty and has taken it many times in the past Other Reaction: sloughing of buccal mucosa   . Losartan Other (See Comments)    Weight gain Weight gain   . Tetracyclines & Related Other (See Comments)    Social History   Socioeconomic History  . Marital status: Widowed    Spouse name: Not on file  . Number of children: Not on file  . Years of  education: Not on file  . Highest education level: Not on file  Occupational History  . Not on file  Tobacco Use  . Smoking status: Never Smoker  . Smokeless tobacco: Never Used  Vaping Use  . Vaping Use: Never used  Substance and Sexual Activity  . Alcohol use: Not Currently  . Drug use: Never  . Sexual activity: Not Currently  Other Topics Concern  . Not on file  Social History Narrative  . Not on file   Social Determinants of Health   Financial Resource Strain: Not on file  Food Insecurity: Not on file  Transportation Needs: Not on file  Physical Activity: Not on file  Stress: Not on file  Social Connections: Not on file  Intimate Partner Violence: Not on file     Family History  Problem Relation Age of Onset  . Lung cancer Father      Review of Systems Positive for shortness of breath  PND orthopnea Negative for: General:  chills, fever, night sweats or weight changes.  Cardiovascular: Positive for PND orthopnea negative for syncope dizziness  Dermatological skin lesions rashes Respiratory: Cough congestion Urologic: Frequent urination urination at night and hematuria Abdominal: negative for nausea, vomiting, diarrhea, bright red blood per rectum, melena, or hematemesis Neurologic: negative for visual changes, and/or hearing changes  All other systems reviewed and are otherwise negative except as noted above.  Labs: No results for input(s): CKTOTAL, CKMB, TROPONINI in the last 72 hours. Lab Results  Component Value Date   WBC 8.9 06/10/2020   HGB 12.0 06/10/2020   HCT 39.9 06/10/2020   MCV 84.9 06/10/2020   PLT 265 06/10/2020    Recent Labs  Lab 06/09/20 1400 06/10/20 0116  NA 137 137  K 5.2* 4.7  CL 101 101  CO2 28 28  BUN 29* 28*  CREATININE 1.12* 1.17*  CALCIUM 8.5* 8.5*  PROT 6.4*  --   BILITOT 0.5  --   ALKPHOS 83  --   ALT 14  --   AST 16  --   GLUCOSE 236* 236*   Lab Results  Component Value Date   CHOL 246 (H) 06/10/2020   HDL 53  06/10/2020   LDLCALC 151 (H) 06/10/2020   TRIG 208 (H) 06/10/2020   No results found for: DDIMER  Radiology/Studies:  [facility] Chest Port 1 View  Result Date: 06/09/2020 CLINICAL DATA:  Respiratory distress. EXAM: PORTABLE CHEST 1 VIEW COMPARISON:  Single-view of the chest 05/15/2020 and 12/08/2019. FINDINGS: There is pulmonary vascular congestion. No consolidative process, pneumothorax or effusion. Heart size is normal. Aortic atherosclerosis. No acute bony abnormality. Severe degenerative disease about the shoulders noted. IMPRESSION: Pulmonary vascular congestion.  No focal abnormality. Aortic Atherosclerosis (ICD10-I70.0). Electronically Signed   By: Inge Rise M.D.   On: 06/09/2020 14:47    EKG: Normal sinus rhythm left anterior fascicular block and right bundle branch block  Weights: Filed Weights   06/09/20 1400 06/10/20 0539  Weight: 125 kg 115.7 kg     Physical Exam: Blood pressure (!) 159/63, pulse 66, temperature 98 F (36.7 C), temperature source Oral, resp. rate 17, height 5\' 6"  (1.676 m), weight 115.7 kg, SpO2 95 %. Body mass index is 41.17 kg/m. General: Well developed, well nourished, in no acute distress. Head eyes ears nose throat: Normocephalic, atraumatic, sclera non-icteric, no xanthomas, nares are without discharge. No apparent thyromegaly and/or mass  Lungs: Normal respiratory effort.  Some wheezes, bibasilar rales, few are honchi.  Heart: RRR with normal S1 S2. no murmur gallop, no rub, PMI is normal size and placement, carotid upstroke normal without bruit, jugular venous pressure is normal Abdomen: Soft, non-tender,  distended with normoactive bowel sounds. No hepatomegaly. No rebound/guarding. No obvious abdominal masses. Abdominal aorta is normal size without bruit Extremities: 2+ edema. no cyanosis, no clubbing, no ulcers  Peripheral : 2+ bilateral upper extremity pulses, 2+ bilateral femoral pulses, 2+ bilateral dorsal pedal pulse Neuro: Alert and  oriented. No facial asymmetry. No focal deficit. Moves all extremities spontaneously. Musculoskeletal: Normal muscle tone without kyphosis Psych:  Responds to questions appropriately with a normal affect.    Assessment: 80 year old female with acute on chronic diastolic dysfunction congestive heart failure with elevated troponin consistent with demand ischemia and chronic disease with an abnormal EKG and possible exacerbation of COPD without evidence of myocardial infarction and/or acute coronary syndrome  Plan: 1.  Continuation of intravenous Lasix watching closely for acute chronic kidney disease. 2.  Continuation  of metoprolol at 12.5 mg twice per day and possibly increase to 25 mg today if able for diastolic dysfunction congestive heart failure and hypertension control 3.  Further treatment of low-dose ACE inhibitor angiotensin receptor blocker for cardiovascular disease as well as heart failure and hypertension control 4.  Echocardiogram for LV systolic dysfunction valvular heart disease 5.  High intensity cholesterol therapy 6.  Further treatment options after improvements of per above  Signed, Corey Skains M.D. [facility] 06/10/2020, 7:22 AM

## 2020-06-10 NOTE — Progress Notes (Signed)
Pt non compliant with bed changes will not notify staff of need to use bathroom, but can get up 1 assist to bsc refuses to use purewick.

## 2020-06-10 NOTE — Plan of Care (Signed)
  Problem: Clinical Measurements: Goal: Respiratory complications will improve Outcome: Progressing   Problem: Clinical Measurements: Goal: Cardiovascular complication will be avoided Outcome: Progressing   Problem: Safety: Goal: Ability to remain free from injury will improve Outcome: Progressing   Problem: Activity: Goal: Risk for activity intolerance will decrease Outcome: Progressing

## 2020-06-10 NOTE — Evaluation (Signed)
Physical Therapy Evaluation Patient Details Name: Karla Sanchez MRN: [id number] DOB: [birth year] Today's Date: 06/10/2020   History of Present Illness  Pt is an 80 y.o. 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 Admitted for CHF exacerbation and hypoxia due to pulmonary edema. Per chart pt with elevated troponin due to demand ischemia.  Pt uses a WC at baseline. Performs 2-3 step MOD I transfers to/from [facility] and propels with feet.    Clinical Impression  Pt sitting EOB finishing breakfast at PT entrance. Pt reported 9/10 pain in L middle finger, and 6/10 in L foot due to swelling, reported that she told the MD. Pt oriented, able to provide clear PLOF, stated she lives in a hotel in a handicap apartment. Normally modI for ADLs, and stand pivots to a WC. Endorsed weakness and more difficulty with mobility/ADLs recently. Has a nephew checks in on her.  The patient demonstrated sit <> stand and stand pivot transfers with RW. Initially minA to rise from elevated bed surface, once in standing CGA for safety/steadying. Pt exhibited good sitting balance, reliant on RW with transfers. Able to sit on commode with supervision and then perform pericare with CGA in standing. Up in recliner at end of session with legs elevated, pt aware of LE edema and verbalized understanding of importance of elevation.  Overall the patient demonstrated deficits (see "PT Problem List") that impede the patient's functional abilities, safety, and mobility and would benefit from skilled PT intervention. Recommendation is SNF due to current level of assistance needed, decline from PLOF, and lack of caregiver support.     Follow Up Recommendations SNF    Equipment Recommendations  None recommended by PT    Recommendations for Other Services       Precautions / Restrictions Precautions Precautions: Fall Restrictions Weight Bearing Restrictions: No      Mobility  Bed Mobility Overal bed mobility: Modified Independent             General bed mobility comments: pt sitting EOB eating breakfast when PT entered room    Transfers Overall transfer level: Needs assistance Equipment used: Rolling walker (2 wheeled) Transfers: Sit to/from [facility] Sit to Stand: Min assist;From elevated surface Stand pivot transfers: Min guard;From elevated surface       General transfer comment: pt needed minA to transfer from EOB to [facility], once in standing able to transfer with CGA  Ambulation/Gait             General Gait Details: deferred, pt non-ambulatory at baseline  Stairs            Wheelchair Mobility    Modified Rankin (Stroke Patients Only)       Balance Overall balance assessment: Needs assistance Sitting-balance support: Feet supported Sitting balance-Leahy Scale: Good       Standing balance-Leahy Scale: Poor Standing balance comment: reliant on UE support                             Pertinent Vitals/Pain Pain Assessment: 0-10 Pain Score: 9  Pain Location: L middle finger, L foot pt reported pain as a 4/10. Pain Descriptors / Indicators: Grimacing;Aching;Sore Pain Intervention(s): Limited activity within  patient's tolerance;Monitored during session;Repositioned    Home Living Family/patient expects to be discharged to::  (hotel)                 Additional Comments: Pt living at [facility] in a handicap room    Prior Function Level of Independence: Needs assistance   Gait / Transfers Assistance Needed: Pt uses w/c for mobility, only performs stand pivot transfers mod I bed<>w/c<>commode. Pt reports she hasn't walked due to  gout in BLE.  ADL's / Homemaking Assistance Needed: Endorses she is independent with bathing, dressing, toileting with increased time, however, states she has increasingly become unable to perform ADLs and IADLs for herself such as cooking, cleaning and laundry.        Hand Dominance   Dominant Hand: Right    Extremity/Trunk Assessment   Upper Extremity Assessment Upper Extremity Assessment: Defer to OT evaluation    Lower Extremity Assessment Lower Extremity Assessment: Generalized weakness (LE grossly 4-/5)    Cervical / Trunk Assessment Cervical / Trunk Assessment: Kyphotic  Communication   Communication: No difficulties  Cognition Arousal/Alertness: Awake/alert Behavior During Therapy: WFL for tasks assessed/performed Overall Cognitive Status: Within Functional Limits for tasks assessed                                        General Comments General comments (skin integrity, edema, etc.): Pt with significant pitting edema noted in bilateral LEs, legs elevated at end of session    Exercises Other Exercises Other Exercises: Pt able to sit on [facility] with supervision, and perform pericare with CGA with RW.   Assessment/Plan    PT Assessment Patient needs continued PT services  PT Problem List Decreased strength;Decreased activity tolerance;Decreased balance;Decreased mobility       PT Treatment Interventions DME instruction;Balance training;Gait training;Neuromuscular re-education;Functional mobility training;Patient/family education;Therapeutic activities;Wheelchair mobility training;Therapeutic exercise    PT Goals (Current goals can be found in the Care Plan section)  Acute Rehab PT Goals Patient Stated Goal: to feel better PT Goal Formulation: With patient Time For Goal Achievement: 06/24/20 Potential to Achieve Goals: Good    Frequency Min 2X/week   Barriers to discharge        Co-evaluation               AM-PAC PT "6 Clicks"  Mobility  Outcome Measure Help needed turning from your back to your side while in a flat bed without using bedrails?: A Little Help needed moving from lying on your back to sitting on the side of a flat bed without using bedrails?: A Little Help needed moving to and from a bed to a chair (including a wheelchair)?: A Little Help needed standing up from a chair using your arms (e.g., wheelchair or bedside chair)?: A Little Help needed to walk in hospital room?: Total Help needed climbing 3-5 steps with a railing? : Total 6 Click Score: 14    End of Session Equipment Utilized During Treatment: Gait belt;Oxygen (4L) Activity Tolerance: Patient tolerated treatment well Patient left: in chair;with call bell/phone within reach;with chair alarm set Nurse Communication: Mobility status PT Visit Diagnosis: Other abnormalities of gait and mobility (R26.89);Difficulty in walking, not elsewhere classified (R26.2);Pain Pain - Right/Left: Left Pain - part of body: Leg    Time: 7494-4967 PT Time Calculation (min) (ACUTE ONLY): 22 min   Charges:   PT Evaluation $PT Eval Low Complexity: 1 Low  PT Treatments $Therapeutic Activity: 8-22 mins        Lieutenant Diego PT, DPT 9:38 AM,06/10/20

## 2020-06-10 NOTE — Progress Notes (Signed)
Pt continues to get up without letting staff know. Pt got up to bedside commode alone and ripped IV out of the arm. Pt made aware, again, to let staff know when she needs to get up.

## 2020-06-10 NOTE — Progress Notes (Signed)
PHARMACIST - PHYSICIAN COMMUNICATION  CONCERNING:  Enoxaparin (Lovenox) for DVT Prophylaxis    RECOMMENDATION: Patient was prescribed enoxaprin 40mg  q24 hours for VTE prophylaxis.   Filed Weights   06/09/20 1400 06/10/20 0539  Weight: 125 kg (275 lb 9.2 oz) 115.7 kg (255 lb 1.2 oz)    Body mass index is 41.17 kg/m.  Estimated Creatinine Clearance: 49.6 mL/min (A) (by C-G formula based on SCr of 1.17 mg/dL (H)).   Based on [place] patient is candidate for enoxaparin 0.5mg /kg TBW SQ every 24 hours based on BMI being >30.  DESCRIPTION: Pharmacy has adjusted enoxaparin dose per [facility] policy.  Patient is now receiving enoxaparin 60 mg every 24 hours    Dorothe Pea, PharmD, BCPS Clinical Pharmacist  06/10/2020 1:58 PM

## 2020-06-10 NOTE — Progress Notes (Signed)
ANTICOAGULATION CONSULT NOTE - Initial Consult  Pharmacy Consult for heparin Indication: chest pain/ACS  Allergies  Allergen Reactions  . Codeine Nausea Only, Other (See Comments) and Swelling    Other Reaction: nausea and vomiting Other reaction(s): NAUSEA   . Other Other (See Comments)    Other reaction(s): SWELLING  Other reaction(s): MUSCLE PAIN Muscle aches Other reaction(s): MUSCLE PAIN Muscle aches Other reaction(s): MUSCLE PAIN Muscle aches   . Exenatide Nausea And Vomiting  . Levofloxacin Other (See Comments)    Other Reaction: sloughing of buccal mucosa Other reaction(s): OTHER Lost skin in mouth Patient states she can take Cipro without difficulty and has taken it many times in the past Other Reaction: sloughing of buccal mucosa   . Losartan Other (See Comments)    Weight gain Weight gain   . Tetracyclines & Related Other (See Comments)    Patient Measurements: Height: 5\' 6"  (167.6 cm) Weight: 125 kg (275 lb 9.2 oz) IBW/kg (Calculated) : 59.3 HEPARIN DW (KG): 89.4  Vital Signs: Temp: 98 F (36.7 C) (03/20 2100) Temp Source: Oral (03/20 2100) BP: 159/63 (03/21 0500) Pulse Rate: 66 (03/21 0500)  Labs: Recent Labs    06/09/20 1400 06/09/20 1601 06/09/20 2133 06/09/20 2324 06/10/20 0116  HGB 12.5  --   --   --  12.0  HCT 41.7  --   --   --  39.9  PLT 272  --   --   --  265  APTT 30  --   --   --   --   LABPROT 12.2  --   --   --   --   INR 0.9  --   --   --   --   HEPARINUNFRC  --   --   --   --  <0.10*  CREATININE 1.12*  --   --   --  1.17*  TROPONINIHS 238* 260* 306* 288*  --     Estimated Creatinine Clearance: 51.8 mL/min (A) (by C-G formula based on SCr of 1.17 mg/dL (H)).   Medical History: Past Medical History:  Diagnosis Date  . CHF (congestive heart failure) ([place])   . Colon cancer [facility])    s/p right and left colectomy 2020  . COPD (chronic obstructive pulmonary disease) ([place])   . Diabetes mellitus without complication ([place])    . Diastolic heart failure ([place])   . Gout   . Hypertension     Assessment: 80 y.o. female presenting with SOB and found to have elevated troponin, ACS. Pharmacy has been consulted for IV heparin dosing.   Hgb and platelets WNL. SCr roughly at baseline compared to previous admission. No overt bleeding noted. No prior to admission anticoagulants noted.  Goal of Therapy:  Heparin level 0.3-0.7 units/ml Monitor platelets by anticoagulation protocol: Yes  Plan:  3/21:  HL @ 0116: < 0.1 Will order Heparin 2700 units IV X 1 bolus and increase drip rate to 1550 units/hr. Will recheck HL 8 hrs after rate change.    Allahna Husband D Clinical Pharmacist  06/10/2020   5:05 AM

## 2020-06-10 NOTE — Progress Notes (Signed)
OT Cancellation Note  Patient Details Name: Karla Sanchez MRN: [id number] DOB: Feb 16, 1941   Cancelled Treatment:    Reason Eval/Treat Not Completed: Fatigue/lethargy limiting ability to participate;Patient declined, no reason specified. Attempted x2 this date, pt refused stating fatigue and desire to nap in recliner. All needs addressed, call bell in reach, chair alarm on. Will follow up and attempt to initiate services at later date/time as pt able to participate.   Dessie Coma, M.S. OTR/L  06/10/20, 2:53 PM  ascom [phone]

## 2020-06-11 LAB — BASIC METABOLIC PANEL
Anion gap: 7 (ref 5–15)
BUN: 37 mg/dL — ABNORMAL HIGH (ref 8–23)
CO2: 30 mmol/L (ref 22–32)
Calcium: 8.4 mg/dL — ABNORMAL LOW (ref 8.9–10.3)
Chloride: 102 mmol/L (ref 98–111)
Creatinine, Ser: 1.48 mg/dL — ABNORMAL HIGH (ref 0.44–1.00)
GFR, Estimated: 36 mL/min — ABNORMAL LOW (ref >60)
Glucose, Bld: 128 mg/dL — ABNORMAL HIGH (ref 70–99)
Potassium: 4.9 mmol/L (ref 3.5–5.1)
Sodium: 139 mmol/L (ref 135–145)

## 2020-06-11 LAB — GLUCOSE, CAPILLARY
Glucose-Capillary: 136 mg/dL — ABNORMAL HIGH (ref 70–99)
Glucose-Capillary: 192 mg/dL — ABNORMAL HIGH (ref 70–99)
Glucose-Capillary: 181 mg/dL — ABNORMAL HIGH (ref 70–99)
Glucose-Capillary: 219 mg/dL — ABNORMAL HIGH (ref 70–99)
Glucose-Capillary: 216 mg/dL — ABNORMAL HIGH (ref 70–99)

## 2020-06-11 LAB — CBC
HCT: 41.3 % (ref 36.0–46.0)
Hemoglobin: 12.6 g/dL (ref 12.0–15.0)
MCH: 25.9 pg — ABNORMAL LOW (ref 26.0–34.0)
MCHC: 30.5 g/dL (ref 30.0–36.0)
MCV: 84.8 fL (ref 80.0–100.0)
Platelets: 265 10*3/uL (ref 150–400)
RBC: 4.87 MIL/uL (ref 3.87–5.11)
RDW: 15.9 % — ABNORMAL HIGH (ref 11.5–15.5)
WBC: 8.9 10*3/uL (ref 4.0–10.5)
nRBC: 0 % (ref 0.0–0.2)

## 2020-06-11 MED ORDER — HYDROCODONE-ACETAMINOPHEN 5-325 MG PO TABS
1.0000 | ORAL_TABLET | ORAL | Status: AC | PRN
  Administered 2020-06-11 – 2020-06-12 (×2): 1 via ORAL
  Filled 2020-06-11 (×2): qty 1

## 2020-06-11 MED ORDER — ZOLPIDEM TARTRATE 5 MG PO TABS
5.0000 mg | ORAL_TABLET | Freq: Every evening | ORAL | Status: DC | PRN
  Administered 2020-06-11: 5 mg via ORAL
  Filled 2020-06-11: qty 1

## 2020-06-11 NOTE — TOC Progression Note (Signed)
Transition of Care [facility]) - Progression Note    Patient Details  Name: Karla Sanchez MRN: [id number] Date of Birth: 1940/09/02  Transition of Care [facility]) CM/SW Contact  Eileen Stanford, LCSW Phone Number: 06/11/2020, 2:19 PM  Clinical Narrative:   CSW spoke with pt at bedside. Pt states she lives at hotel but cannot go back there because she has run out of money. Pt states she doesn't need rehab because she is wheelchair bound at baseline. Pt states her nephew is her guardian. Pt gave CSW verbal permission to contact her Nephew to try to determine a dc plan. Pt was inquiring about a long term bed in a nursing home however, pt doesn't have medicaid. Pt states she drives herself to appointmetns in her handicapped [name]. Pt states she moved from [place] to the hotel in [place] so here PCP is in [place]. Pt is interested in a PCP here in [place], specifically [facility]. Pt states she gets her meds via mail order. CSW to reach out to Nephew to determine dispo.    Expected Discharge Plan: [place] Barriers to Discharge: Continued Medical Work up  Expected Discharge Plan and Services Expected Discharge Plan: [place] In-house Referral: Clinical Social Work Discharge Planning Services: NA Post Acute Care Choice: [place] Living arrangements for the past 2 months: Hotel/Motel                 DME Arranged: N/A DME Agency: NA       HH Arranged: NA [place] Agency: NA         Social Determinants of Health (SDOH) Interventions    Readmission Risk Interventions Readmission Risk Prevention Plan 06/10/2020 10/24/2019  Transportation Screening Complete -  Medication Review Press photographer) Complete Complete  PCP or Specialist appointment within 3-5 days of discharge Complete -  [place] or Home Care Consult Complete Complete  SW Recovery Care/Counseling Consult Complete -  Palliative Care Screening Not Applicable -  [place] Complete Patient Refused

## 2020-06-11 NOTE — Progress Notes (Signed)
Mobility Specialist - Progress Note   06/11/20 1400  Mobility  Activity Transferred to/from [facility]  Level of Assistance Standby assist, set-up cues, supervision of patient - no hands on  Assistive Device Front wheel walker  Distance Ambulated (ft) 6 ft  Mobility Response Tolerated well  Mobility performed by Mobility specialist  $Mobility charge 1 Mobility    Pt ambulated from recliner-BSC-recliner with supervision. Pt adamantly refused gait belt d/t being in a hurry to urinate. Pt stood to RW with very close supervision and use of momentum. Supervision for peri-hygiene.    Kathee Delton Mobility Specialist 06/11/20, 2:47 PM

## 2020-06-11 NOTE — NC FL2 (Signed)
[place] LEVEL OF CARE SCREENING TOOL     IDENTIFICATION  Patient Name: Karla Sanchez Birthdate: February 16, 1941 Sex: female Admission Date (Current Location): 06/09/2020  [place] and [place] Number:  Engineering geologist and Address:  [facility], [address], [place], [place] [address]      Provider Number: [id number]  Attending Physician Name and Address:  Debbe Odea, MD  Relative Name and Phone Number:       Current Level of Care: Hospital Recommended Level of Care: [place] Prior Approval Number:    Date Approved/Denied:   PASRR Number: [id number]  Discharge Plan: SNF    Current Diagnoses: Patient Active Problem List   Diagnosis Date Noted  . Acute on chronic diastolic heart failure ([place]) 04/25/2020  . SOB (shortness of breath) 04/25/2020  . Elevated troponin 04/25/2020  . B12 deficiency   . Short-term memory loss   . Weakness   . Fungal skin infection   . Obesity (BMI 30-39.9)   . Cellulitis of right leg 01/04/2020  . COPD with acute exacerbation ([place]) 12/08/2019  . Physical deconditioning 12/08/2019  . Acute respiratory failure with hypoxia ([place])   . Iron deficiency anemia   . Type 2 diabetes mellitus without complication, with long-term current use of insulin ([place])   . CHF exacerbation ([place]) 11/20/2019  . Acute on chronic diastolic (congestive) heart failure ([place]) 11/19/2019  . Acute pulmonary edema (HCC)   . Acute on chronic diastolic CHF (congestive heart failure) ([place]) 10/22/2019  . Cellulitis of right foot 10/22/2019  . Non compliance with medical treatment   . Chronic respiratory failure with hypoxia ([place])   . Hypertension   . Chronic kidney disease, stage 3b ([place])   . Acute on chronic respiratory failure with hypoxia ([place])   . Diarrhea   . COPD (chronic obstructive pulmonary disease) ([place]) 10/03/2019  . Obesity, Class III, BMI 40-49.9 (morbid obesity) ([place]) 10/03/2019  . Chronic  diastolic CHF (congestive heart failure) ([place]) 07/09/2019  . DM2 (diabetes mellitus, type 2) ([place]) 07/09/2019  . Peripheral edema 07/09/2019  . Gout 07/09/2019  . Obesity, diabetes, and hypertension syndrome ([place]) 07/09/2019  . Anemia of chronic disease 07/09/2019    Orientation RESPIRATION BLADDER Height & Weight     Self,Time,Situation,Place  Normal Continent Weight: 255 lb 1.2 oz (115.7 kg) Height:  5\' 6"  (167.6 cm)  BEHAVIORAL SYMPTOMS/MOOD NEUROLOGICAL BOWEL NUTRITION STATUS      Continent Diet (Diet heart healthy/carb modified, Fluid restriction: 1200 mL Fluid)  AMBULATORY STATUS COMMUNICATION OF NEEDS Skin   Extensive Assist Verbally Normal                       Personal Care Assistance Level of Assistance  Bathing,Feeding,Dressing Bathing Assistance: Maximum assistance Feeding assistance: Independent Dressing Assistance: Maximum assistance     Functional Limitations Info  Sight,Hearing,Speech Sight Info: Adequate Hearing Info: Adequate Speech Info: Adequate    SPECIAL CARE FACTORS FREQUENCY  PT (By licensed PT),OT (By licensed OT)     PT Frequency: 5x OT Frequency: 5x            Contractures Contractures Info: Not present    Additional Factors Info  Code Status,Allergies Code Status Info: DNR Allergies Info: Codeine, Other, Exenatide, Levofloxacin, Losartan, Tetracyclines & Related           Current Medications (06/11/2020):  This is the current hospital active medication list Current Facility-Administered Medications  Medication Dose Route Frequency Provider Last  Rate Last Admin  . allopurinol (ZYLOPRIM) tablet 100 mg  100 mg Oral Daily Debbe Odea, MD   100 mg at 06/11/20 2330  . aspirin EC tablet 81 mg  81 mg Oral Daily Kayleen Memos, DO   81 mg at 06/11/20 0762  . atorvastatin (LIPITOR) tablet 20 mg  20 mg Oral Daily Irene Pap N, DO   20 mg at 06/11/20 2633  . enoxaparin (LOVENOX) injection 60 mg  60 mg Subcutaneous Q24H Rizwan, Saima,  MD      . feeding supplement (GLUCERNA SHAKE) (GLUCERNA SHAKE) liquid 237 mL  237 mL Oral BID BM Rizwan, Saima, MD   237 mL at 06/11/20 0840  . ferrous sulfate tablet 325 mg  325 mg Oral Daily Kayleen Memos, DO   325 mg at 06/11/20 3545  . furosemide (LASIX) injection 40 mg  40 mg Intravenous TID Irene Pap N, DO   40 mg at 06/10/20 2150  . hydrALAZINE (APRESOLINE) injection 5 mg  5 mg Intravenous Q6H PRN Irene Pap N, DO      . hydrALAZINE (APRESOLINE) tablet 10 mg  10 mg Oral Q8H Hall, Carole N, DO   10 mg at 06/11/20 0553  . insulin aspart (novoLOG) injection 0-15 Units  0-15 Units Subcutaneous Q4H Kayleen Memos, DO   3 Units at 06/11/20 6256  . liver oil-zinc oxide (DESITIN) 40 % ointment   Topical BID Lang Snow, NP   Given at 06/11/20 0840  . loperamide (IMODIUM) capsule 2 mg  2 mg Oral PRN Debbe Odea, MD      . metoprolol tartrate (LOPRESSOR) tablet 25 mg  25 mg Oral BID Corey Skains, MD   25 mg at 06/10/20 2149     Discharge Medications: Please see discharge summary for a list of discharge medications.  Relevant Imaging Results:  Relevant Lab Results:   Additional Information [id number]  Gerrianne Scale Jahmir Salo, LCSW

## 2020-06-11 NOTE — Evaluation (Addendum)
Occupational Therapy Evaluation Patient Details Name: Karla Sanchez MRN: [id number] DOB: 1940/05/02 Today's Date: 06/11/2020    History of Present Illness Pt is an 80 y.o. 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 Admitted for CHF exacerbation and hypoxia due to pulmonary edema. Per chart pt with elevated troponin due to demand ischemia.  Pt uses a WC at baseline. Performs 2-3 step MOD I transfers to/from [facility] and propels with feet.   Clinical Impression   Karla Sanchez was seen for OT evaluation this date. Prior to hospital admission, pt was MOD I for mobility and ADLs using w/c. Pt lives in [place] accessible [place]. Pt presents to acute OT demonstrating impaired ADL performance and functional mobility 2/2 decreased activity tolerance and functional strength/ROM/balance deficits. Pt currently requires SUPERVISION doff B socks seated EOC, MAX A don B compression socks seated EOC (MD in room requesting ted hose). MIN A + HHA for simulated BSC t/f. Pt would benefit from skilled OT to address noted impairments and functional limitations (see below for any additional details) in order to maximize safety and independence while minimizing falls risk and caregiver burden. Upon hospital discharge, recommend STR to maximize pt safety and return to PLOF.     Follow Up Recommendations  SNF    Equipment Recommendations  None recommended by OT    Recommendations for Other Services       Precautions / Restrictions Precautions Precautions: Fall Restrictions Weight Bearing Restrictions: No      Mobility Bed Mobility               General bed mobility comments: pt  received and left in chair    Transfers Overall transfer level: Needs assistance Equipment used: 1 person hand held assist Transfers: Sit to/from Stand Sit to Stand: Min assist              Balance Overall balance assessment: Needs assistance Sitting-balance support: Feet supported Sitting balance-Leahy Scale: Good     Standing balance support: Bilateral upper extremity supported Standing balance-Leahy Scale: Poor Standing balance comment: reliant on UE support                           ADL either performed or assessed with clinical judgement   ADL Overall ADL's : Needs assistance/impaired                                       General ADL Comments: SUPERVISION doff B socks seated EOC, MAX A don B compression socks seated EOC. MIN A + HHA for simulated BSC t/f                  Pertinent Vitals/Pain Pain Assessment: Faces Faces Pain Scale: Hurts little more Pain Location: L middle finger Pain Descriptors / Indicators: Grimacing;Aching;Sore Pain Intervention(s): Limited activity within patient's tolerance;Repositioned     Hand Dominance Right   Extremity/Trunk Assessment Upper Extremity Assessment Upper Extremity Assessment: Generalized weakness   Lower Extremity Assessment Lower Extremity Assessment: Generalized weakness   Cervical / Trunk Assessment Cervical / Trunk Assessment: Kyphotic   Communication Communication Communication: No difficulties  Cognition Arousal/Alertness: Awake/alert Behavior During Therapy: WFL for tasks assessed/performed Overall Cognitive Status: Within Functional Limits for tasks assessed                                     General Comments  BLE edema. SpO2 96% on RA, desat 89% on RA, resolved with 1L [place]    Exercises Exercises: Other exercises Other Exercises Other Exercises: Pt educated re: OT role, DME recs d/c recs, ECS, falls prevention Other Exercises: LBD, sit<>stand,  sitting/standing balance/tolerance   Shoulder Instructions      Home Living Family/patient expects to be discharged to:: Other (Comment) ([place])                                 Additional Comments: Pt living at [facility] in a handicap room      Prior Functioning/Environment Level of Independence: Needs assistance  Gait / Transfers Assistance Needed: Pt uses w/c for mobility, only performs stand pivot transfers mod I bed<>w/c<>commode. Pt reports she hasn't walked due to gout in BLE. ADL's / Homemaking Assistance Needed: Endorses she is independent with bathing, dressing, toileting with increased time, however, states she has increasingly become unable to perform ADLs and IADLs for herself such as cooking, cleaning and laundry.            OT Problem List: Decreased strength;Decreased activity tolerance;Decreased range of motion;Impaired balance (sitting and/or standing);Decreased safety awareness;Increased edema      OT Treatment/Interventions: Self-care/ADL training;Therapeutic exercise;Energy conservation;DME and/or AE instruction;Therapeutic activities;Patient/family education;Balance training    OT Goals(Current goals can be found in the care plan section) Acute Rehab OT Goals Patient Stated Goal: to feel better OT Goal Formulation: With patient Time For Goal Achievement: 06/25/20 Potential to Achieve Goals: Good ADL Goals Pt Will Perform Grooming: with modified independence;standing (pt will tolerate >5 mins standing) Pt Will Perform Lower Body Dressing: with modified independence;sit to/from stand (c LRAD PRN) Pt Will Transfer to Toilet: with modified independence;ambulating;bedside commode (c LRAD PRN)  OT Frequency: Min 1X/week              AM-PAC OT "6 Clicks" Daily Activity     Outcome Measure Help from another person eating meals?: None Help from another person taking care of personal grooming?: A Little Help from another  person toileting, which includes using toliet, bedpan, or urinal?: A Little Help from another person bathing (including washing, rinsing, drying)?: A Lot Help from another person to put on and taking off regular upper body clothing?: A Little Help from another person to put on and taking off regular lower body clothing?: A Lot 6 Click Score: 17   End of Session Nurse Communication: Mobility status  Activity Tolerance: Patient limited by fatigue Patient left: in chair;with call bell/phone within reach;with chair alarm set  OT Visit Diagnosis: Other abnormalities of gait and mobility (R26.89);Muscle weakness (generalized) (M62.81)                Time: 6269-4854 OT Time Calculation (min): 18 min Charges:  OT General Charges $OT Visit: 1 Visit OT Evaluation $OT Eval Low Complexity: 1 Low OT Treatments $Self Care/Home Management : 8-22 mins  Dessie Coma, M.S. OTR/L  06/11/20, 1:50 PM  ascom [phone]

## 2020-06-11 NOTE — TOC Progression Note (Signed)
Transition of Care [facility]) - Progression Note    Patient Details  Name: Karla Sanchez MRN: [id number] Date of Birth: 01-Jul-1940  Transition of Care [facility]) CM/SW Contact  Eileen Stanford, LCSW Phone Number: 06/11/2020, 3:57 PM  Clinical Narrative:   CSW spoke with pt's Nephew. [name] Nephew states this is a ongoing cycle with her. [name] Nephew states he has told her she needs to get into a Assisted living facility. Pt states she is willing to go to rehab and look for a long term bed somewhere. [name] only bed offer is [facility] [name] nephew was asking if there was any facilities near [place]--CSW will reach out to facilities in [place].    Expected Discharge Plan: [place] Barriers to Discharge: Continued Medical Work up  Expected Discharge Plan and Services Expected Discharge Plan: [place] In-house Referral: Clinical Social Work Discharge Planning Services: NA Post Acute Care Choice: [place] Living arrangements for the past 2 months: Hotel/Motel                 DME Arranged: N/A DME Agency: NA       HH Arranged: NA [place] Agency: NA         Social Determinants of Health (SDOH) Interventions    Readmission Risk Interventions Readmission Risk Prevention Plan 06/10/2020 10/24/2019  Transportation Screening Complete -  Medication Review Press photographer) Complete Complete  PCP or Specialist appointment within 3-5 days of discharge Complete -  [place] or Home Care Consult Complete Complete  SW Recovery Care/Counseling Consult Complete -  Palliative Care Screening Not Applicable -  [place] Complete Patient Refused

## 2020-06-11 NOTE — TOC Progression Note (Signed)
Transition of Care [facility]) - Progression Note    Patient Details  Name: Gwenn Teodoro MRN: [id number] Date of Birth: Aug 24, 1940  Transition of Care [facility]) CM/SW Contact  Eileen Stanford, LCSW Phone Number: 06/11/2020, 10:05 AM  Clinical Narrative:   Bed search initiated     Expected Discharge Plan: [place] Barriers to Discharge: Continued Medical Work up  Expected Discharge Plan and Services Expected Discharge Plan: [place] In-house Referral: Clinical Social Work Discharge Planning Services: NA Post Acute Care Choice: [place] Living arrangements for the past 2 months: Hotel/Motel                 DME Arranged: N/A DME Agency: NA       HH Arranged: NA [place] Agency: NA         Social Determinants of Health (SDOH) Interventions    Readmission Risk Interventions Readmission Risk Prevention Plan 06/10/2020 10/24/2019  Transportation Screening Complete -  Medication Review Press photographer) Complete Complete  PCP or Specialist appointment within 3-5 days of discharge Complete -  [place] or Home Care Consult Complete Complete  SW Recovery Care/Counseling Consult Complete -  Palliative Care Screening Not Applicable -  [place] Complete Patient Refused

## 2020-06-11 NOTE — Progress Notes (Signed)
Alert and oriented x 3. Denies pain or discomfort. Declined blood glucose checks and insulin after she went to sleep saying she will only do it during the day. Plan of care followed. Pt says she is looking for a place [place] stay [place]. Looking into seneior living or nursing home.

## 2020-06-11 NOTE — Progress Notes (Signed)
PROGRESS NOTE    Karla Sanchez [facility]   TML:[id number]  DOB: Jun 21, 1940  DOA: 06/09/2020 PCP: Alfredia Client, MD   Brief Narrative:  Karla Sanchez is a 80 y.o. 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 He is admitted again for acute heart failure.  Troponin noted to be 238.  She was admitted from 2/3-2/6 for acute heart failure and left AMA, she states today, because she knew she was better. She further states that she drinks a lot of liquids.  Subjective: She is feeling irritation in her vaginal area and no longer wants to wear the purewick. She is willing to go to SNF as recommended by PT. No dyspnea today but feet very swollen.     Assessment & Plan:   Principal Problem:   Acute on chronic diastolic heart failure   AKI on CKD 3b  -Suspect this is secondary to excess fluid drinking- given IV Lasix -Continue to follow I  & O and daily weights -I requested a dietitian consult to talk to her about 1200 cc fluid restriction and sodium and carbohydrate restriction - she seems to be euvolemic now - Cr has risen to 1.48- hold Lasix  Active Problems:  Type II NSTEMI -Secondary to above mentioned heart failure  Pedal edema  - due to venous stasis- elevate -  place TEDS today  Hyperlipidemia - discussed dietary changes- since she is going to a SNF, she will have to be adherent to a low fat diet - cont Lipitor and recheck in 3 months  Burning micturition -I ordered a clean-catch UA    DM2 (diabetes mellitus, type 2), uncontrolled with hyperglycemia -The patient has various different  types and doses of insulin listed in the computer-she tells me she cannot remember what she was taking and how much she was taking -Follow on sliding scale insulin for now- sugars erratic ranging from 100-325- will not change anything today    Component Value Date/Time   HGBA1C 8.6 (H) 06/10/2020 0116     Gout - has as needed colchicine ordered for outpatient -We will start allopurinol 100 mg daily for pervention    Obesity, Class III, BMI 40-49.9 (morbid obesity)  Body mass index is 42.16 kg/m.       Time spent in minutes: 35 DVT prophylaxis: Lovenox Code Status: DO NOT RESUSCITATE Family Communication:  Level of Care: Level of care: Med-Surg Disposition Plan:  Status is: Inpatient  Remains inpatient appropriate because:IV treatments appropriate due to intensity of illness or inability to take PO   Dispo:  Patient From: Home  Planned Disposition: [place]  Medically stable for discharge: yes        Consultants:   Cardiology Procedures:   2D echo Antimicrobials:  Anti-infectives (From admission, onward)   None       Objective: Vitals:   06/11/20 0820 06/11/20 1050 06/11/20 1150 06/11/20 1243  BP: (!) 100/37 (!) 164/73  (!) 129/45  Pulse: 66 (!) 57  (!) 55  Resp:    19  Temp:    98 F (36.7 C)  TempSrc:    Oral  SpO2:    94%  Weight:  118.5 kg   Height:        Intake/Output Summary (Last 24 hours) at 06/11/2020 1457 Last data filed at 06/11/2020 1439 Gross per 24 hour  Intake 840 ml  Output 750 ml  Net 90 ml   Filed Weights   06/09/20 1400 06/10/20 0539 06/11/20 1150  Weight: 125 kg 115.7 kg 118.5 kg    Examination: General exam: Appears comfortable  HEENT: PERRLA, oral mucosa moist, no sclera icterus or thrush Respiratory system: Crackles at bases respiratory effort normal. Cardiovascular system: S1 & S2 heard, RRR.   Gastrointestinal system: Abdomen soft, non-tender, nondistended. Normal bowel sounds. Central nervous  system: Alert and oriented. No focal neurological deficits. Extremities: No cyanosis, clubbing 2+ pedal edema Skin: No rashes or ulcers Psychiatry:  Mood & affect appropriate.     Data Reviewed: I have personally reviewed following labs and imaging studies  CBC: Recent Labs  Lab 06/09/20 1400 06/10/20 0116 06/11/20 0451  WBC 7.9 8.9 8.9  NEUTROABS 4.9  --   --   HGB 12.5 12.0 12.6  HCT 41.7 39.9 41.3  MCV 85.1 84.9 84.8  PLT 272 265 017   Basic Metabolic Panel: Recent Labs  Lab 06/09/20 1400 06/10/20 0116 06/11/20 0451  NA 137 137 139  K 5.2* 4.7 4.9  CL 101 101 102  CO2 28 28 30   GLUCOSE 236* 236* 128*  BUN 29* 28* 37*  CREATININE 1.12* 1.17* 1.48*  CALCIUM 8.5* 8.5* 8.4*  MG  --  2.1  --   PHOS  --  4.3  --    GFR: Estimated Creatinine Clearance: 39.7 mL/min (A) (by C-G formula based on SCr of 1.48 mg/dL (H)). Liver Function Tests: Recent Labs  Lab 06/09/20 1400  AST 16  ALT 14  ALKPHOS 83  BILITOT 0.5  PROT 6.4*  ALBUMIN 2.9*   No results for input(s): LIPASE, AMYLASE in the last 168 hours. No results for input(s): AMMONIA in the last 168 hours. Coagulation Profile: Recent Labs  Lab 06/09/20 1400  INR 0.9   Cardiac Enzymes: No results for input(s): CKTOTAL, CKMB, CKMBINDEX, TROPONINI in the last 168 hours. BNP (last 3 results) No results for input(s): PROBNP in the last 8760 hours. HbA1C: Recent Labs    06/10/20 0116  HGBA1C 8.6*   CBG: Recent Labs  Lab 06/10/20 1632 06/10/20 2017 06/11/20 0535 06/11/20 0816 06/11/20 1157  GLUCAP 103* 282* 136* 192* 181*   Lipid Profile: Recent Labs    06/10/20 0116  CHOL 246*  HDL 53  LDLCALC 151*  TRIG 208*  CHOLHDL 4.6   Thyroid Function Tests: No results for input(s): TSH, T4TOTAL, FREET4, T3FREE, THYROIDAB in the last 72 hours. Anemia Panel: No results for input(s): VITAMINB12, FOLATE, FERRITIN, TIBC, IRON, RETICCTPCT in the last 72 hours. Urine analysis:    Component Value  Date/Time   COLORURINE YELLOW (A) 06/09/2020 2050   APPEARANCEUR HAZY (A) 06/09/2020 2050   LABSPEC 1.008 06/09/2020 2050   PHURINE 6.0 06/09/2020 2050   GLUCOSEU 50 (A) 06/09/2020 2050   HGBUR NEGATIVE 06/09/2020 2050   BILIRUBINUR NEGATIVE 06/09/2020 2050   [place] 06/09/2020 2050   PROTEINUR 100 (A) 06/09/2020 2050   NITRITE POSITIVE (A) 06/09/2020 2050   LEUKOCYTESUR NEGATIVE 06/09/2020 2050   Sepsis Labs: @LABRCNTIP (procalcitonin:4,lacticidven:4) ) Recent Results (from the past 240 hour(s))  SARS CORONAVIRUS 2 (TAT 6-24 HRS) Nasopharyngeal Nasopharyngeal Swab     Status: None   Collection Time: 06/09/20  2:00 PM   Specimen: Nasopharyngeal Swab  Result Value Ref Range Status   SARS Coronavirus 2 NEGATIVE NEGATIVE Final    Comment: (NOTE) SARS-CoV-2 target nucleic acids are NOT DETECTED.  The SARS-CoV-2 RNA is generally detectable in upper and lower respiratory specimens during the acute phase of infection. Negative results do not preclude SARS-CoV-2 infection, do not rule out co-infections with other pathogens, and should not be used as the sole basis for treatment or other patient management decisions. Negative results must be combined with clinical observations, patient history, and epidemiological information. The expected result is Negative.  Fact Sheet for Patients: SugarRoll.be  Fact Sheet for Healthcare Providers: https://www.woods-mathews.com/  This test is not yet approved or cleared by the [place] FDA and  has been authorized for detection and/or diagnosis of SARS-CoV-2 by FDA under an Emergency Use Authorization (EUA). This EUA will remain  in effect (meaning this test can be used) for the duration of the COVID-19 declaration under Se ction 564(b)(1) of the Act, 21 U.S.C. section 360bbb-3(b)(1), unless the authorization is terminated or revoked sooner.  Performed at [facility], [place]  [address] [place], [place] [address]          Radiology Studies: ECHOCARDIOGRAM COMPLETE  Result Date: 06/10/2020    ECHOCARDIOGRAM REPORT   Patient Name:   MARILUZ CRESPO [facility] Date of Exam: 06/10/2020 Medical Rec #:  [id number]              Height:       66.0 in Accession #:    [id number]             Weight:       255.1 lb Date of Birth:  February 28, 1941              BSA:          2.218 m Patient Age:    89 years               BP:           127/59 mmHg Patient Gender: F                      HR:           72 bpm. Exam Location:  [facility] Procedure: 2D Echo, Color Doppler, Cardiac Doppler and Strain Analysis Indications:     Elevated troponin  History:         Patient has prior history of Echocardiogram examinations, most                  recent 07/11/2019. CHF, COPD; Risk Factors:Hypertension and                  Diabetes.  Sonographer:     Charmayne Sheer RDCS (AE) Referring Phys:  [id number] [place] Diagnosing Phys: Kathlyn Sacramento MD  Sonographer Comments: Suboptimal apical window. Image acquisition challenging due to COPD. Global longitudinal strain was attempted. IMPRESSIONS  1. Left ventricular ejection fraction, by estimation, is 55 to 60%. The left ventricle has normal function. The left ventricle has no regional wall motion abnormalities. There is mild left ventricular hypertrophy. Left ventricular diastolic parameters are consistent with Grade I diastolic dysfunction (impaired relaxation).  2. Right ventricular systolic function is normal. The right ventricular size is normal. Tricuspid regurgitation signal is inadequate for assessing PA pressure.  3. Left atrial size was mildly dilated.  4. The mitral valve is normal in structure. Mild mitral valve regurgitation. No evidence of mitral stenosis.  5. The aortic valve is normal in structure. Aortic valve regurgitation is not visualized. Mild aortic valve stenosis. Aortic valve area, by VTI measures 1.95 cm. Aortic valve mean gradient measures 7.0 mmHg.  6. The  inferior vena cava is dilated in size with >50% respiratory variability, suggesting right atrial pressure of 8 mmHg. FINDINGS  Left Ventricle: Left ventricular ejection fraction, by estimation, is 55 to 60%. The left ventricle has normal function. The left ventricle has no regional wall motion abnormalities. Global longitudinal strain performed but not reported based on interpreter judgement due to suboptimal tracking. The left ventricular internal cavity size was normal in size. There is mild left ventricular hypertrophy. Left ventricular diastolic parameters are consistent with Grade I diastolic dysfunction (impaired relaxation). Right Ventricle: The right ventricular size is normal. No increase in right ventricular wall thickness. Right ventricular systolic function is normal. Tricuspid regurgitation signal is inadequate for assessing PA pressure. Left Atrium: Left atrial size was mildly dilated. Right Atrium: Right atrial size was normal in size. Pericardium: There is no evidence of pericardial effusion. Mitral Valve: The mitral valve is normal in structure. Mild mitral annular calcification. Mild mitral valve regurgitation. No evidence of mitral valve stenosis. MV peak gradient, 4.7 mmHg. The mean mitral valve gradient is 2.0 mmHg. Tricuspid Valve: The tricuspid valve is normal in structure. Tricuspid valve regurgitation is not demonstrated. No evidence of tricuspid stenosis. Aortic Valve: The aortic valve is normal in structure. Aortic valve regurgitation is not visualized. Mild aortic stenosis is present. Aortic valve mean gradient measures 7.0 mmHg. Aortic valve peak gradient measures 11.3 mmHg. Aortic valve area, by VTI measures 1.95 cm. Pulmonic Valve: The pulmonic valve was normal in structure. Pulmonic valve regurgitation is not visualized. No evidence of pulmonic stenosis. Aorta: The aortic root is normal in size and structure. Venous: The inferior vena cava is dilated in size with greater than 50%  respiratory variability, suggesting right atrial pressure of 8 mmHg. IAS/Shunts: No atrial level shunt detected by color flow Doppler.  LEFT VENTRICLE PLAX 2D LVIDd:         5.40 cm  Diastology LVIDs:         3.80 cm  LV e' medial:    4.24 cm/s LV PW:         1.40 cm  LV E/e' medial:  17.6 LV IVS:        0.90 cm  LV e' lateral:   6.64 cm/s LVOT diam:     2.10 cm  LV E/e' lateral: 11.2 LV SV:         64 LV SV Index:   29 LVOT Area:     3.46 cm  RIGHT VENTRICLE RV Basal diam:  2.40 cm LEFT ATRIUM             Index       RIGHT ATRIUM           Index LA diam:        4.50 cm 2.03 cm/m  RA Area:     16.50 cm LA Vol (A2C):   29.0 ml 13.08 ml/m RA Volume:   42.30 ml  19.07 ml/m LA Vol (A4C):   43.3 ml 19.53 ml/m LA Biplane Vol: 37.3 ml 16.82 ml/m  AORTIC VALVE                    PULMONIC VALVE AV Area (Vmax):    1.88 cm     PV Vmax:  1.29 m/s AV Area (Vmean):   1.77 cm     PV Vmean:      87.900 cm/s AV Area (VTI):     1.95 cm     PV VTI:        0.235 m AV Vmax:           168.00 cm/s  PV Peak grad:  6.7 mmHg AV Vmean:          121.000 cm/s PV Mean grad:  3.0 mmHg AV VTI:            0.327 m AV Peak Grad:      11.3 mmHg AV Mean Grad:      7.0 mmHg LVOT Vmax:         91.40 cm/s LVOT Vmean:        62.000 cm/s LVOT VTI:          0.184 m LVOT/AV VTI ratio: 0.56  AORTA Ao Root diam: 2.80 cm MITRAL VALVE MV Area (PHT): 5.23 cm     SHUNTS MV Area VTI:   2.59 cm     Systemic VTI:  0.18 m MV Peak grad:  4.7 mmHg     Systemic Diam: 2.10 cm MV Mean grad:  2.0 mmHg MV Vmax:       1.08 m/s MV Vmean:      64.0 cm/s MV Decel Time: 145 msec MV E velocity: 74.70 cm/s MV A velocity: 115.00 cm/s MV E/A ratio:  0.65 Kathlyn Sacramento MD Electronically signed by Kathlyn Sacramento MD Signature Date/Time: 06/10/2020/12:19:16 PM    Final       Scheduled Meds: . allopurinol  100 mg Oral Daily  . aspirin EC  81 mg Oral Daily  . atorvastatin  20 mg Oral Daily  . enoxaparin (LOVENOX) injection  60 mg Subcutaneous Q24H  . feeding  supplement (GLUCERNA SHAKE)  237 mL Oral BID BM  . ferrous sulfate  325 mg Oral Daily  . hydrALAZINE  10 mg Oral Q8H  . insulin aspart  0-15 Units Subcutaneous Q4H  . liver oil-zinc oxide   Topical BID  . metoprolol tartrate  25 mg Oral BID   Continuous Infusions:   LOS: 2 days      Debbe Odea, MD [facility] Pager: [email or web address] 06/11/2020, 2:57 PM

## 2020-06-12 LAB — BASIC METABOLIC PANEL
Anion gap: 9 (ref 5–15)
BUN: 42 mg/dL — ABNORMAL HIGH (ref 8–23)
CO2: 28 mmol/L (ref 22–32)
Calcium: 8.7 mg/dL — ABNORMAL LOW (ref 8.9–10.3)
Chloride: 102 mmol/L (ref 98–111)
Creatinine, Ser: 1.41 mg/dL — ABNORMAL HIGH (ref 0.44–1.00)
GFR, Estimated: 38 mL/min — ABNORMAL LOW (ref >60)
Glucose, Bld: 155 mg/dL — ABNORMAL HIGH (ref 70–99)
Potassium: 5 mmol/L (ref 3.5–5.1)
Sodium: 139 mmol/L (ref 135–145)

## 2020-06-12 LAB — GLUCOSE, CAPILLARY
Glucose-Capillary: 156 mg/dL — ABNORMAL HIGH (ref 70–99)
Glucose-Capillary: 205 mg/dL — ABNORMAL HIGH (ref 70–99)
Glucose-Capillary: 188 mg/dL — ABNORMAL HIGH (ref 70–99)
Glucose-Capillary: 231 mg/dL — ABNORMAL HIGH (ref 70–99)
Glucose-Capillary: 139 mg/dL — ABNORMAL HIGH (ref 70–99)

## 2020-06-12 MED ORDER — ISOSORBIDE MONONITRATE ER 30 MG PO TB24
30.0000 mg | ORAL_TABLET | Freq: Every day | ORAL | Status: DC
  Administered 2020-06-12 – 2020-06-13 (×2): 30 mg via ORAL
  Filled 2020-06-12 (×2): qty 1

## 2020-06-12 MED ORDER — HYDRALAZINE HCL 50 MG PO TABS
50.0000 mg | ORAL_TABLET | Freq: Three times a day (TID) | ORAL | Status: DC
  Administered 2020-06-12: 50 mg via ORAL
  Filled 2020-06-12 (×2): qty 1

## 2020-06-12 MED ORDER — INSULIN GLARGINE 100 UNIT/ML ~~LOC~~ SOLN
10.0000 [IU] | Freq: Every day | SUBCUTANEOUS | Status: DC
  Administered 2020-06-12: 10 [IU] via SUBCUTANEOUS
  Filled 2020-06-12 (×2): qty 0.1

## 2020-06-12 MED ORDER — FUROSEMIDE 40 MG PO TABS
40.0000 mg | ORAL_TABLET | Freq: Every day | ORAL | Status: DC
  Administered 2020-06-13: 40 mg via ORAL
  Filled 2020-06-12 (×2): qty 1

## 2020-06-12 NOTE — Progress Notes (Signed)
[facility] Encounter Note  Patient: Karla Sanchez / Admit Date: 06/09/2020 / Date of Encounter: 06/12/2020, 7:54 AM   Subjective: Patient overall has felt much better since admission with less shortness of breath and no evidence of chest discomfort.  Patient's urine output has been poor based on readings although there has been some improvements of lower extremity edema and pulmonary edema as well as oxygenation.  Patient is ambulating well without evidence of chest discomfort.  Patient slept in the bed well last night with no evidence of symptoms whatsoever  Echocardiogram showing normal LV systolic function ejection fraction of 55% and no evidence of significant valvular heart disease requiring further intervention  Review of Systems: Positive for: Shortness of breath Negative for: Vision change, hearing change, syncope, dizziness, nausea, vomiting,diarrhea, bloody stool, stomach pain, cough, congestion, diaphoresis, urinary frequency, urinary pain,skin lesions, skin rashes Others previously listed  Objective: Telemetry: Normal sinus rhythm with bundle branch block Physical Exam: Blood pressure (!) 143/78, pulse (!) 50, temperature 98.2 F (36.8 C), temperature source Oral, resp. rate 19, height 5\' 6"  (1.676 m), weight 118.5 kg, SpO2 97 %. Body mass index is 42.16 kg/m. General: Well developed, well nourished, in no acute distress. Head: Normocephalic, atraumatic, sclera non-icteric, no xanthomas, nares are without discharge. Neck: No apparent masses Lungs: Normal respirations with few wheezes, no rhonchi, no rales few crackles   Heart: Regular rate and rhythm, normal S1 S2, no murmur, no rub, no gallop, PMI is normal size and placement, carotid upstroke normal without bruit, jugular venous pressure normal Abdomen: Soft, non-tender, distended with normoactive bowel sounds. No hepatosplenomegaly. Abdominal aorta is normal size without bruit Extremities:  Trace edema, no clubbing, no cyanosis, no ulcers,  Peripheral: 2+ radial, 2+ femoral, 2+ dorsal pedal pulses Neuro: Alert and oriented. Moves all extremities spontaneously. Psych:  Responds to questions appropriately with a normal affect.   Intake/Output Summary (Last 24 hours) at 06/12/2020 0754 Last data filed at 06/11/2020 1920 Gross per 24 hour  Intake 840 ml  Output 850 ml  Net -10 ml    Inpatient Medications:  . allopurinol  100 mg Oral Daily  . aspirin EC  81 mg Oral Daily  . atorvastatin  20 mg Oral Daily  . enoxaparin (LOVENOX) injection  60 mg Subcutaneous Q24H  . feeding supplement (GLUCERNA SHAKE)  237 mL Oral BID BM  . ferrous sulfate  325 mg Oral Daily  . hydrALAZINE  10 mg Oral Q8H  . insulin aspart  0-15 Units Subcutaneous Q4H  . liver oil-zinc oxide   Topical BID  . metoprolol tartrate  25 mg Oral BID   Infusions:   Labs: Recent Labs    06/10/20 0116 06/11/20 0451  NA 137 139  K 4.7 4.9  CL 101 102  CO2 28 30  GLUCOSE 236* 128*  BUN 28* 37*  CREATININE 1.17* 1.48*  CALCIUM 8.5* 8.4*  MG 2.1  --   PHOS 4.3  --    Recent Labs    06/09/20 1400  AST 16  ALT 14  ALKPHOS 83  BILITOT 0.5  PROT 6.4*  ALBUMIN 2.9*   Recent Labs    06/09/20 1400 06/10/20 0116 06/11/20 0451  WBC 7.9 8.9 8.9  NEUTROABS 4.9  --   --   HGB 12.5 12.0 12.6  HCT 41.7 39.9 41.3  MCV 85.1 84.9 84.8  PLT 272 265 265   No results for input(s): CKTOTAL, CKMB, TROPONINI in the last 72 hours. Invalid input(s):  POCBNP Recent Labs    06/10/20 0116  HGBA1C 8.6*     Weights: Filed Weights   06/09/20 1400 06/10/20 0539 06/11/20 1150  Weight: 125 kg 115.7 kg 118.5 kg     Radiology/Studies:  DG Chest Port 1 View  Result Date: 06/09/2020 CLINICAL DATA:  Respiratory distress. EXAM: PORTABLE CHEST 1 VIEW COMPARISON:  Single-view of the chest 05/15/2020 and 12/08/2019. FINDINGS: There is pulmonary vascular congestion. No consolidative process, pneumothorax or effusion.  Heart size is normal. Aortic atherosclerosis. No acute bony abnormality. Severe degenerative disease about the shoulders noted. IMPRESSION: Pulmonary vascular congestion.  No focal abnormality. Aortic Atherosclerosis (ICD10-I70.0). Electronically Signed   By: Inge Rise M.D.   On: 06/09/2020 14:47   ECHOCARDIOGRAM COMPLETE  Result Date: 06/10/2020    ECHOCARDIOGRAM REPORT   Patient Name:   Karla Sanchez [facility] Date of Exam: 06/10/2020 Medical Rec #:  [id number]              Height:       66.0 in Accession #:    [id number]             Weight:       255.1 lb Date of Birth:  1940-08-03              BSA:          2.218 m Patient Age:    80 years               BP:           127/59 mmHg Patient Gender: F                      HR:           72 bpm. Exam Location:  [facility] Procedure: 2D Echo, Color Doppler, Cardiac Doppler and Strain Analysis Indications:     Elevated troponin  History:         Patient has prior history of Echocardiogram examinations, most                  recent 07/11/2019. CHF, COPD; Risk Factors:Hypertension and                  Diabetes.  Sonographer:     Charmayne Sheer RDCS (AE) Referring Phys:  [id number] [place] Diagnosing Phys: Kathlyn Sacramento MD  Sonographer Comments: Suboptimal apical window. Image acquisition challenging due to COPD. Global longitudinal strain was attempted. IMPRESSIONS  1. Left ventricular ejection fraction, by estimation, is 55 to 60%. The left ventricle has normal function. The left ventricle has no regional wall motion abnormalities. There is mild left ventricular hypertrophy. Left ventricular diastolic parameters are consistent with Grade I diastolic dysfunction (impaired relaxation).  2. Right ventricular systolic function is normal. The right ventricular size is normal. Tricuspid regurgitation signal is inadequate for assessing PA pressure.  3. Left atrial size was mildly dilated.  4. The mitral valve is normal in structure. Mild mitral valve regurgitation. No evidence  of mitral stenosis.  5. The aortic valve is normal in structure. Aortic valve regurgitation is not visualized. Mild aortic valve stenosis. Aortic valve area, by VTI measures 1.95 cm. Aortic valve mean gradient measures 7.0 mmHg.  6. The inferior vena cava is dilated in size with >50% respiratory variability, suggesting right atrial pressure of 8 mmHg. FINDINGS  Left Ventricle: Left ventricular ejection fraction, by estimation, is 55 to 60%. The left ventricle has normal function. The left ventricle has  no regional wall motion abnormalities. Global longitudinal strain performed but not reported based on interpreter judgement due to suboptimal tracking. The left ventricular internal cavity size was normal in size. There is mild left ventricular hypertrophy. Left ventricular diastolic parameters are consistent with Grade I diastolic dysfunction (impaired relaxation). Right Ventricle: The right ventricular size is normal. No increase in right ventricular wall thickness. Right ventricular systolic function is normal. Tricuspid regurgitation signal is inadequate for assessing PA pressure. Left Atrium: Left atrial size was mildly dilated. Right Atrium: Right atrial size was normal in size. Pericardium: There is no evidence of pericardial effusion. Mitral Valve: The mitral valve is normal in structure. Mild mitral annular calcification. Mild mitral valve regurgitation. No evidence of mitral valve stenosis. MV peak gradient, 4.7 mmHg. The mean mitral valve gradient is 2.0 mmHg. Tricuspid Valve: The tricuspid valve is normal in structure. Tricuspid valve regurgitation is not demonstrated. No evidence of tricuspid stenosis. Aortic Valve: The aortic valve is normal in structure. Aortic valve regurgitation is not visualized. Mild aortic stenosis is present. Aortic valve mean gradient measures 7.0 mmHg. Aortic valve peak gradient measures 11.3 mmHg. Aortic valve area, by VTI measures 1.95 cm. Pulmonic Valve: The pulmonic valve  was normal in structure. Pulmonic valve regurgitation is not visualized. No evidence of pulmonic stenosis. Aorta: The aortic root is normal in size and structure. Venous: The inferior vena cava is dilated in size with greater than 50% respiratory variability, suggesting right atrial pressure of 8 mmHg. IAS/Shunts: No atrial level shunt detected by color flow Doppler.  LEFT VENTRICLE PLAX 2D LVIDd:         5.40 cm  Diastology LVIDs:         3.80 cm  LV e' medial:    4.24 cm/s LV PW:         1.40 cm  LV E/e' medial:  17.6 LV IVS:        0.90 cm  LV e' lateral:   6.64 cm/s LVOT diam:     2.10 cm  LV E/e' lateral: 11.2 LV SV:         64 LV SV Index:   29 LVOT Area:     3.46 cm  RIGHT VENTRICLE RV Basal diam:  2.40 cm LEFT ATRIUM             Index       RIGHT ATRIUM           Index LA diam:        4.50 cm 2.03 cm/m  RA Area:     16.50 cm LA Vol (A2C):   29.0 ml 13.08 ml/m RA Volume:   42.30 ml  19.07 ml/m LA Vol (A4C):   43.3 ml 19.53 ml/m LA Biplane Vol: 37.3 ml 16.82 ml/m  AORTIC VALVE                    PULMONIC VALVE AV Area (Vmax):    1.88 cm     PV Vmax:       1.29 m/s AV Area (Vmean):   1.77 cm     PV Vmean:      87.900 cm/s AV Area (VTI):     1.95 cm     PV VTI:        0.235 m AV Vmax:           168.00 cm/s  PV Peak grad:  6.7 mmHg AV Vmean:          121.000  cm/s PV Mean grad:  3.0 mmHg AV VTI:            0.327 m AV Peak Grad:      11.3 mmHg AV Mean Grad:      7.0 mmHg LVOT Vmax:         91.40 cm/s LVOT Vmean:        62.000 cm/s LVOT VTI:          0.184 m LVOT/AV VTI ratio: 0.56  AORTA Ao Root diam: 2.80 cm MITRAL VALVE MV Area (PHT): 5.23 cm     SHUNTS MV Area VTI:   2.59 cm     Systemic VTI:  0.18 m MV Peak grad:  4.7 mmHg     Systemic Diam: 2.10 cm MV Mean grad:  2.0 mmHg MV Vmax:       1.08 m/s MV Vmean:      64.0 cm/s MV Decel Time: 145 msec MV E velocity: 74.70 cm/s MV A velocity: 115.00 cm/s MV E/A ratio:  0.65 Kathlyn Sacramento MD Electronically signed by Kathlyn Sacramento MD Signature Date/Time:  06/10/2020/12:19:16 PM    Final      Assessment and Recommendation  80 y.o. female with acute on chronic diastolic dysfunction congestive heart failure without evidence of myocardial infarction as well as COPD exacerbation chronic kidney disease stage III now clinically improved 1.  Continue medication management for acute on chronic diastolic dysfunction congestive heart failure with beta-blocker.  Cannot increase dose of beta-blocker at this time due to bradycardia.  Will add isosorbide hydralazine combination for now for better hypertension control and treatment of above.  Will consider other angiotensin receptor blocker if necessary or able depending on chronic kidney disease 2.  No further cardiac diagnostics necessary at this time 3.  Patient is to have congestive heart failure clinic evaluation and treatment due to recurrent episodes of hospitalization  Signed, Serafina Royals M.D. FACC

## 2020-06-12 NOTE — Care Management Important Message (Signed)
Important Message  Patient Details  Name: Karla Sanchez MRN: [id number] Date of Birth: 1940-12-21   Medicare Important Message Given:  Yes     Dannette Barbara 06/12/2020, 11:30 AM

## 2020-06-12 NOTE — Progress Notes (Signed)
Patient refuses a.m. labs, CBG monitoring and V/S this morning. She claims she "would like to sleep, this is the best sleep she has gotten in a while." This RN will re-attempt collection at later time.

## 2020-06-12 NOTE — Progress Notes (Signed)
Mobility Specialist - Progress Note   06/12/20 1500  Mobility  Range of Motion/Exercises All extremities (AP, SLR, HS, ABD, NR, SR)  Level of Assistance Minimal assist, patient does 75% or more  Assistive Device None  Distance Ambulated (ft) 0 ft  Mobility Response Tolerated well  Mobility performed by Mobility specialist  $Mobility charge 1 Mobility    Pre-mobility: 56 HR, 88% SpO2 During mobility: 54 HR, 86% SpO2 Post-mobility: 53 HR, 91% SpO2   Pt agitated upon arrival, but pleasant throughout session. Declined OOB activity d/t pain in LE. Participated in supine therex: ankle pumps x10, straight leg raises x10, heel slides x10, abduction x10, neck rolls x10, and shoulder raises x10. Pt expresses that she often experiences pain d/t gout and muscle stiffness. Pt educated on importance of mobility and encourages to continue exercises in between sessions, pt showed understanding. VC needed for technique. Pt on RA with sats ranging 88-91%, pt denies SOB. Pt hyper-verbal, requiring max redirection.    Kathee Delton Mobility Specialist 06/12/20, 3:46 PM

## 2020-06-12 NOTE — Progress Notes (Signed)
Patient A/Ox4 but very forgetful. Patient agitated with staff because "the room tempeture keeps changing by itself". Patient also refusing to have her sugar checked Q4, only allowing staff to check her nighttime blood sugar. Patient also refusing to allow staff to check her vitals throughout the night, and refusing the bed alarm. RN educated the importance of the bed alarm and stated that it is just for the patient's stately but patient is still refusing at this time. Pain medicine given for tooth and gout pain, Ambien given for sleep aid. Will continue with the current plan of care.

## 2020-06-12 NOTE — TOC Progression Note (Signed)
Transition of Care [facility]) - Progression Note    Patient Details  Name: Karla Sanchez MRN: [id number] Date of Birth: 05-26-1940  Transition of Care [facility] CM/SW [place], RN Phone Number: 06/12/2020, 3:56 PM  Clinical Narrative:  Discussed with patient in detail about an additional two more SNF acceptance for Rehab services, [facility] and [facility] Now with 3 choices patient is indecisive and will not make a decision. Now requesting to go back to [place] areas, informed patient that I have extended the search and there are no bed offers. I did explain to patient that she is medically stable for discharge and needed to make a decision tomorrow. Patient voices understanding.     Expected Discharge Plan: [place] Barriers to Discharge: Continued Medical Work up  Expected Discharge Plan and Services Expected Discharge Plan: [place] In-house Referral: Clinical Social Work Discharge Planning Services: NA Post Acute Care Choice: [place] Living arrangements for the past 2 months: Hotel/Motel                 DME Arranged: N/A DME Agency: NA       HH Arranged: NA [place] Agency: NA         Social Determinants of Health (SDOH) Interventions    Readmission Risk Interventions Readmission Risk Prevention Plan 06/10/2020 10/24/2019  Transportation Screening Complete -  Medication Review Press photographer) Complete Complete  PCP or Specialist appointment within 3-5 days of discharge Complete -  [place] or Home Care Consult Complete Complete  SW Recovery Care/Counseling Consult Complete -  Palliative Care Screening Not Applicable -  [place] Complete Patient Refused

## 2020-06-12 NOTE — Consult Note (Signed)
   Heart Failure Nurse Navigator Note  HFpEF 55%.  Mild LVH.  Grade 1 diastolic dysfunction.  Normal right ventricular systolic function.  Mild left atrial enlargement.  Mild aortic valve stenosis.   She presented to the emergency room with complaints of increasing shortness of breath and lower extremity swelling.  Comorbidities:  Hypertension Type 2 diabetes Anxiety depression Gout  Medications:  Aspirin 81 mg daily Allopurinol 100 mg daily Atorvastatin 20 mg daily Furosemide 40 mg by mouth daily Hydralazine 50 mg every 8 hours Isosorbide mononitrate 30 mg daily Metoprolol tartrate 25 mg 2 times a day   Labs:  Sodium 139, potassium 5, chloride 102, CO2 28, BUN 42, creatinine 1.41. Weight is 118.5 kg up from 115.7 of yesterday Intake 840 mL Output 850 mL BMI 42.16 Blood pressure 106/49  Assessment:  General-she is awake and alert, no acute distress noted.  HEENT-pupils are equal, normocephalic.  Cardiac-heart tones with regular rate and rhythm no murmur gallop appreciated.  Chest-breath sounds are clear to posterior auscultation.  Abdomen -obese rounded nontender.  Musculoskeletal-1+ pitting edema on the left and trace on the right.   Psych-is pleasant and appropriate makes good eye contact.  Neurologic-speech is clear, moves all extremities without difficulty.    Met with patient today, she is awake and alert sitting up in the chair at bedside, states she had a good night sleep last night.  States that prior to admission she had been living in a hotel and is hoping that she can get placed in a SNF.  She states that she gets her breakfast through the hotel and then lunch and dinner she orders from [place].  She does admit to getting their soups [place] onion and etc. which she states that are really good, for the meal she will get chicken or the fish with a vegetable.  She does not use salt.  Discouraged her from ordering the soups as they are high in  sodium.  Discussed fluid restriction, she feels that she does not drink over eight 8 ounce cups in a days time.  Also discussed being compliant with her medications.  She states that she is having troubles with her her term memory.  Discussed the outpatient heart failure clinic, for which she has an appointment on April 18 at 9 AM.  She was given the informational postcard.  We will continue to follow.   Pricilla Riffle RN [facility]

## 2020-06-12 NOTE — Progress Notes (Signed)
RN notified by CCMD of patient HR in the 40's RN to room to assess patient, patient Is asleep in chair and easy to arouse HR of 45. MD Posey Pronto notified. Will continue to monitor patient.

## 2020-06-12 NOTE — Progress Notes (Signed)
[facility] [place] NAME: Karla Sanchez    MR#:  [id number]  DATE OF BIRTH:  1940-06-25  SUBJECTIVE:  sitting out in the chair. Dozing off. Tells me the cloudy weather is making her very sleepy  REVIEW OF SYSTEMS:   Review of Systems  Constitutional: Negative for chills, fever and weight loss.  HENT: Negative for ear discharge, ear pain and nosebleeds.   Eyes: Negative for blurred vision, pain and discharge.  Respiratory: Positive for shortness of breath. Negative for sputum production, wheezing and stridor.   Cardiovascular: Negative for chest pain, palpitations, orthopnea and PND.  Gastrointestinal: Negative for abdominal pain, diarrhea, nausea and vomiting.  Genitourinary: Negative for frequency and urgency.  Musculoskeletal: Negative for back pain and joint pain.  Neurological: Positive for weakness. Negative for sensory change, speech change and focal weakness.  Psychiatric/Behavioral: Negative for depression and hallucinations. The patient is not nervous/anxious.    Tolerating Diet:yesTolerating PT: WC bound  DRUG ALLERGIES:   Allergies  Allergen Reactions  . Codeine Nausea Only, Other (See Comments) and Swelling    Other Reaction: nausea and vomiting Other reaction(s): NAUSEA   . Other Other (See Comments)    Other reaction(s): SWELLING  Other reaction(s): MUSCLE PAIN Muscle aches Other reaction(s): MUSCLE PAIN Muscle aches Other reaction(s): MUSCLE PAIN Muscle aches   . Exenatide Nausea And Vomiting  . Levofloxacin Other (See Comments)    Other Reaction: sloughing of buccal mucosa Other reaction(s): OTHER Lost skin in mouth Patient states she can take Cipro without difficulty and has taken it many times in the past Other Reaction: sloughing of buccal mucosa   . Losartan Other (See Comments)    Weight gain Weight gain   . Tetracyclines & Related Other (See Comments)    VITALS:  Blood pressure (!) 106/49,  pulse (!) 43, temperature (!) 97.5 F (36.4 C), temperature source Oral, resp. rate 18, height 5\' 6"  (1.676 m), weight 118.5 kg, SpO2 (!) 89 %.  PHYSICAL EXAMINATION:   Physical Exam  GENERAL:  80 y.o.-year-old patient lying in the bed with no acute distress. Obese HEENT: Head atraumatic, normocephalic. Oropharynx and nasopharynx clear.  LUNGS: decreased breath sounds bilaterally, no wheezing, rales, rhonchi. No use of accessory muscles of respiration.  CARDIOVASCULAR: S1, S2 normal. No murmurs, rubs, or gallops.  ABDOMEN: Soft, nontender, nondistended. Bowel sounds present. 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    NEUROLOGIC: grossly non focal  PSYCHIATRIC:  patient is alert and oriented x 3.  SKIN:per RN        LABORATORY PANEL:  CBC Recent Labs  Lab 06/11/20 0451  WBC 8.9  HGB 12.6  HCT 41.3  PLT 265    Chemistries  Recent Labs  Lab 06/09/20 1400 06/10/20 0116 06/11/20 0451 06/12/20 0902  NA 137 137   < > 139  K 5.2* 4.7   < > 5.0  CL 101 101   < > 102  CO2 28 28   < > 28  GLUCOSE 236* 236*   < > 155*  BUN 29* 28*   < > 42*  CREATININE 1.12* 1.17*   < > 1.41*  CALCIUM 8.5* 8.5*   < > 8.7*  MG  --  2.1  --   --   AST 16  --   --   --   ALT 14  --   --   --   ALKPHOS 83  --   --   --  BILITOT 0.5  --   --   --    < > = values in this interval not displayed.   Cardiac Enzymes No results for input(s): TROPONINI in the last 168 hours. RADIOLOGY:  No results found. 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 ED due to worsening shortness of breath of 2 to 3 days duration, associated with bilateral lower  extremity edema She further states that she drinks a lot of liquids.   Acute on chronic diastolic heart failure   AKI on CKD 3b  -Suspect this is secondary to excess fluid drinking- given IV Lasix -Continue to follow I  & O and daily weights UOP about 1250 cc so far -cont 1200 cc fluid restriction and sodium and carbohydrate restriction - she seems to be euvolemic now however has some chronic leg edema - Cr down to 1.4 (1.48) resume home lasix 40  Daily for now and then increase to bid  Type II NSTEMI -Secondary to above mentioned heart failure --no cp  Chronic bilateral Pedal edema/pt is WC dependent - due to venous stasis- elevate -  place TEDS today  Hyperlipidemia - discussed dietary changes - cont Lipitor a  Burning micturition -UA no wbc, few bacteria. Cont to monitor    DM2 (diabetes mellitus, type 2), uncontrolled with hyperglycemia -start Lantus 10 units qd--not sure how much complinace she has to it Labs (Brief)          Component Value Date/Time   HGBA1C 8.6 (H) 06/10/2020 0116       Gout - has as needed colchicine ordered for outpatient - now on allopurinol 100 mg daily for pervention    Obesity, Class III, BMI 40-49.9 (morbid obesity)  Body mass index is 42.16 kg/m.       DVT prophylaxis: Lovenox Code Status: DO NOT RESUSCITATE Family Communication:  Level of Care: Level of care: Med-Surg Disposition Plan:  Status is: Inpatient  medically is best at baseline for discharge              Patient From: Home             Planned Disposition: [place]-- patient does not want to go to [facility]. She wants to try go to facility and [place]. Social worker aware.             Medically stable for discharge: yes  I did discuss the plan with patient's nephew [name] who does not have much say with patient's medical issues since he tells me patient makes her own decision.                         TOTAL TIME TAKING  CARE OF THIS PATIENT: **25* minutes.  >50% time spent on counselling and coordination of care  Note: This dictation was prepared with Dragon dictation along with smaller phrase technology. Any transcriptional errors that result from this process are unintentional.  Fritzi Mandes M.D    [facility]   CC: Primary care physician; Alfredia Client, MDPatient ID: Karla Sanchez, female   DOB: 22-Jan-1941, 80 y.o.   MRN: [id number]

## 2020-06-13 LAB — GLUCOSE, CAPILLARY
Glucose-Capillary: 123 mg/dL — ABNORMAL HIGH (ref 70–99)
Glucose-Capillary: 270 mg/dL — ABNORMAL HIGH (ref 70–99)

## 2020-06-13 MED ORDER — ISOSORBIDE MONONITRATE ER 30 MG PO TB24: 30.0000 mg | ORAL_TABLET | Freq: Every day | ORAL | 0 refills | Status: DC

## 2020-06-13 MED ORDER — METOPROLOL TARTRATE 25 MG PO TABS: 25.0000 mg | ORAL_TABLET | Freq: Two times a day (BID) | ORAL | 2 refills | Status: DC

## 2020-06-13 MED ORDER — INSULIN GLARGINE 100 UNIT/ML ~~LOC~~ SOLN
15.0000 [IU] | Freq: Every day | SUBCUTANEOUS | Status: DC
  Administered 2020-06-13: 15 [IU] via SUBCUTANEOUS
  Filled 2020-06-13 (×2): qty 0.15

## 2020-06-13 MED ORDER — PREDNISONE 20 MG PO TABS: ORAL_TABLET | ORAL | 0 refills | Status: AC

## 2020-06-13 MED ORDER — COLCHICINE 0.6 MG PO TABS: 0.6000 mg | ORAL_TABLET | Freq: Every day | ORAL | 0 refills | Status: DC | PRN

## 2020-06-13 MED ORDER — ATORVASTATIN CALCIUM 20 MG PO TABS: 20.0000 mg | ORAL_TABLET | Freq: Every day | ORAL | 0 refills | Status: DC

## 2020-06-13 MED ORDER — FUROSEMIDE 40 MG PO TABS: 40.0000 mg | ORAL_TABLET | Freq: Two times a day (BID) | ORAL | 2 refills | Status: DC

## 2020-06-13 MED ORDER — COLCHICINE 0.6 MG PO TABS
0.6000 mg | ORAL_TABLET | Freq: Every day | ORAL | Status: DC
  Administered 2020-06-13: 0.6 mg via ORAL
  Filled 2020-06-13: qty 1

## 2020-06-13 MED ORDER — POTASSIUM CHLORIDE CRYS ER 10 MEQ PO TBCR: 10.0000 meq | EXTENDED_RELEASE_TABLET | Freq: Every day | ORAL | 1 refills | Status: DC

## 2020-06-13 MED ORDER — LEVEMIR 100 UNIT/ML ~~LOC~~ SOLN: 15.0000 [IU] | Freq: Every day | SUBCUTANEOUS | 11 refills | Status: DC

## 2020-06-13 MED ORDER — HYDRALAZINE HCL 50 MG PO TABS: 50.0000 mg | ORAL_TABLET | Freq: Two times a day (BID) | ORAL | Status: DC

## 2020-06-13 MED ORDER — PREDNISONE 50 MG PO TABS: 50.0000 mg | ORAL_TABLET | Freq: Every day | ORAL | Status: DC

## 2020-06-13 NOTE — Discharge Summary (Signed)
[facility] NAME: Karla Sanchez    MR#:  [id number]  DATE OF BIRTH:  Sep 10, 1940  DATE OF ADMISSION:  06/09/2020 ADMITTING PHYSICIAN: Kayleen Memos, DO  DATE OF DISCHARGE: 06/13/2020  PRIMARY CARE PHYSICIAN: Alfredia Client, MD    ADMISSION DIAGNOSIS:  COPD (chronic obstructive pulmonary disease) ([place]) [J44.9] CHF (congestive heart failure) (HCC) [I50.9] SOB (shortness of breath) [R06.02] Acute exacerbation of CHF (congestive heart failure) ([place]) [I50.9]  DISCHARGE DIAGNOSIS:  Acute on Chronic CHF, diastolic  SECONDARY DIAGNOSIS:   Past Medical History:  Diagnosis Date  . CHF (congestive heart failure) ([place])   . Colon cancer [facility])    s/p right and left colectomy 2020  . COPD (chronic obstructive pulmonary disease) ([place])   . Diabetes mellitus without complication ([place])   . Diastolic heart failure ([place])   . Gout   . 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   Acute on chronic diastolic heart failure  AKI on CKD 3b -Suspect this is secondary to excess fluid drinking- given IV Lasix -Continue to follow I &O and daily weights UOP about 1250 cc so far -cont 1200 cc fluid restriction and sodium and carbohydrate restriction - she seems to be euvolemic now however has some chronic leg edema - Cr down to 1.4 (1.48) resume home lasix 40  Daily for now and  increased to bid  Type II NSTEMI -Secondary to above  mentioned heart failure --no cp  Chronic bilateral Pedal edema/pt is WC dependent - due to venous stasis- elevate legs  Hyperlipidemia - discussed dietary changes - cont Lipitor a  Burning micturition -UA no wbc, few bacteria. Cont to monitor  DM2 (diabetes mellitus, type 2), uncontrolled with hyperglycemia -start Lantus 10 units qd--not sure how much complinace she has to it Labs (Brief)         Component Value Date/Time   HGBA1C 8.6 (H) 06/10/2020 0116     Gout -hasas needed colchicine ordered for outpatient - now on allopurinol 100 mg dailyfor pervention  Obesity, Class III, BMI 40-49.9 (morbid obesity)  Body mass index is 42.16 kg/m.     DVT prophylaxis:Lovenox Code Status:DO NOT RESUSCITATE Family Communication:  Level of Care: Level of care:Med-Surg Disposition Plan: Status is: Inpatient  medically is best at baseline for discharge.  Patient has been indecisive about her discharge plan. This morning she told me she wants to now go to rehab. The rehab facility that has offered her she is on her do not accept list discussed with patient and she was" surprised" about it. She tells me she will go back to her hotel in [place] and will figure out her living situation thereafter. Unfortunately her nephew is not able to help given patient making her own decisions. At this time patient will be discharged to her hotel. TOC informed '[facility] [place] [facility] pt's request.  CONSULTS OBTAINED:    DRUG ALLERGIES:   Allergies  Allergen  Reactions  . Codeine Nausea Only, Other (See Comments) and Swelling    Other Reaction: nausea and vomiting Other reaction(s): NAUSEA   . Other Other (See Comments)    Other reaction(s): SWELLING  Other reaction(s): MUSCLE PAIN Muscle aches Other reaction(s): MUSCLE PAIN Muscle aches Other reaction(s): MUSCLE PAIN Muscle aches   . Exenatide Nausea  And Vomiting  . Levofloxacin Other (See Comments)    Other Reaction: sloughing of buccal mucosa Other reaction(s): OTHER Lost skin in mouth Patient states she can take Cipro without difficulty and has taken it many times in the past Other Reaction: sloughing of buccal mucosa   . Losartan Other (See Comments)    Weight gain Weight gain   . Tetracyclines & Related Other (See Comments)    DISCHARGE MEDICATIONS:   Allergies as of 06/13/2020      Reactions   Codeine Nausea Only, Other (See Comments), Swelling   Other Reaction: nausea and vomiting Other reaction(s): NAUSEA   Other Other (See Comments)   Other reaction(s): SWELLING Other reaction(s): MUSCLE PAIN Muscle aches Other reaction(s): MUSCLE PAIN Muscle aches Other reaction(s): MUSCLE PAIN Muscle aches   Exenatide Nausea And Vomiting   Levofloxacin Other (See Comments)   Other Reaction: sloughing of buccal mucosa Other reaction(s): OTHER Lost skin in mouth Patient states she can take Cipro without difficulty and has taken it many times in the past Other Reaction: sloughing of buccal mucosa   Losartan Other (See Comments)   Weight gain Weight gain   Tetracyclines & Related Other (See Comments)      Medication List    STOP taking these medications   insulin glargine 100 UNIT/ML Solostar Pen Commonly known as: LANTUS   insulin lispro 100 UNIT/ML KwikPen Commonly known as: HUMALOG   ipratropium-albuterol 0.5-2.5 (3) MG/3ML Soln Commonly known as: DUONEB   mirtazapine 15 MG tablet Commonly known as: REMERON     TAKE these medications   acetaminophen 325 MG tablet Commonly known as: TYLENOL Take 650 mg by mouth every 6 (six) hours as needed for mild pain or fever.   albuterol 108 (90 Base) MCG/ACT inhaler Commonly known as: VENTOLIN HFA Inhale 2 puffs into the lungs every 6 (six) hours as needed for wheezing or shortness of breath.   aspirin EC 81 MG tablet Take 1 tablet (81 mg total) by mouth daily.  Swallow whole.   atorvastatin 20 MG tablet Commonly known as: LIPITOR Take 1 tablet (20 mg total) by mouth daily. Start taking on: June 14, 2020   colchicine 0.6 MG tablet Take 1 tablet (0.6 mg total) by mouth daily as needed (acute gout flare).   docusate sodium 100 MG capsule Commonly known as: COLACE Take 1 capsule (100 mg total) by mouth at bedtime.   ferrous sulfate 325 (65 FE) MG tablet Take 1 tablet (325 mg total) by mouth daily.   furosemide 40 MG tablet Commonly known as: LASIX Take 1 tablet (40 mg total) by mouth 2 (two) times daily.   Insulin Pen Needle 31G X 8 MM Misc 1 Dose by Does not apply route 3 (three) times daily before meals.   isosorbide mononitrate 30 MG 24 hr tablet Commonly known as: IMDUR Take 1 tablet (30 mg total) by mouth daily. Start taking on: June 14, 2020   Levemir 100 UNIT/ML injection Generic drug: insulin detemir Inject 0.15 mLs (15 Units total) into the skin daily. What changed: See the new instructions.   liver oil-zinc oxide 40 % ointment Commonly known as:  DESITIN Apply 1 application topically in the morning and at bedtime. Uses on buttock   metoprolol tartrate 25 MG tablet Commonly known as: LOPRESSOR Take 1 tablet (25 mg total) by mouth 2 (two) times daily. What changed: how much to take   nystatin powder Commonly known as: MYCOSTATIN/NYSTOP Apply topically 2 (two) times daily.   potassium chloride 10 MEQ tablet Commonly known as: KLOR-CON Take 1 tablet (10 mEq total) by mouth daily.   predniSONE 20 MG tablet Commonly known as: DELTASONE Take daily for 5 days Start taking on: June 14, 2020   traZODone 50 MG tablet Commonly known as: DESYREL Take 1 tablet (50 mg total) by mouth at bedtime as needed for sleep.       If you experience worsening of your admission symptoms, develop shortness of breath, life threatening emergency, suicidal or homicidal thoughts you must seek medical attention immediately by calling  911 or calling your MD immediately  if symptoms less severe.  You Must read complete instructions/literature along with all the possible adverse reactions/side effects for all the Medicines you take and that have been prescribed to you. Take any new Medicines after you have completely understood and accept all the possible adverse reactions/side effects.   Please note  You were cared for by a hospitalist during your hospital stay. If you have any questions about your discharge medications or the care you received while you were in the hospital after you are discharged, you can call the unit and asked to speak with the hospitalist on call if the hospitalist that took care of you is not available. Once you are discharged, your primary care physician will handle any further medical issues. Please note that NO REFILLS for any discharge medications will be authorized once you are discharged, as it is imperative that you return to your primary care physician (or establish a relationship with a primary care physician if you do not have one) for your aftercare needs so that they can reassess your need for medications and monitor your lab values. Today   SUBJECTIVE   My knee hurts  VITAL SIGNS:  Blood pressure (!) 165/74, pulse 78, temperature 97.7 F (36.5 C), resp. rate 19, height 5\' 6"  (1.676 m), weight 118.5 kg, SpO2 93 %.  I/O:    Intake/Output Summary (Last 24 hours) at 06/13/2020 1011 Last data filed at 06/13/2020 0930 Gross per 24 hour  Intake 240 ml  Output 500 ml  Net -260 ml    PHYSICAL EXAMINATION:  GENERAL:  80 y.o.-year-old patient lying in the bed with no acute distress. obese LUNGS: Normal breath sounds bilaterally, no wheezing, rales,rhonchi or crepitation. No use of accessory muscles of respiration.  CARDIOVASCULAR: S1, S2 normal. No murmurs, rubs, or gallops.  ABDOMEN: Soft, non-tender, non-distended. Bowel sounds present. No organomegaly or mass.  EXTREMITIES: bilateral  chronic venous stasis with  pedal edema, no cyanosis, or clubbing.  NEUROLOGIC: non-focal PSYCHIATRIC: patient is alert and oriented x 3.  SKIN: No obvious rash, lesion, or ulcer.   DATA REVIEW:   CBC  Recent Labs  Lab 06/11/20 0451  WBC 8.9  HGB 12.6  HCT 41.3  PLT 265    Chemistries  Recent Labs  Lab 06/09/20 1400 06/10/20 0116 06/11/20 0451 06/12/20 0902  NA 137 137   < > 139  K 5.2* 4.7   < > 5.0  CL 101 101   < > 102  CO2 28 28   < > 28  GLUCOSE 236* 236*   < >  155*  BUN 29* 28*   < > 42*  CREATININE 1.12* 1.17*   < > 1.41*  CALCIUM 8.5* 8.5*   < > 8.7*  MG  --  2.1  --   --   AST 16  --   --   --   ALT 14  --   --   --   ALKPHOS 83  --   --   --   BILITOT 0.5  --   --   --    < > = values in this interval not displayed.    Microbiology Results   Recent Results (from the past 240 hour(s))  SARS CORONAVIRUS 2 (TAT 6-24 HRS) Nasopharyngeal Nasopharyngeal Swab     Status: None   Collection Time: 06/09/20  2:00 PM   Specimen: Nasopharyngeal Swab  Result Value Ref Range Status   SARS Coronavirus 2 NEGATIVE NEGATIVE Final    Comment: (NOTE) SARS-CoV-2 target nucleic acids are NOT DETECTED.  The SARS-CoV-2 RNA is generally detectable in upper and lower respiratory specimens during the acute phase of infection. Negative results do not preclude SARS-CoV-2 infection, do not rule out co-infections with other pathogens, and should not be used as the sole basis for treatment or other patient management decisions. Negative results must be combined with clinical observations, patient history, and epidemiological information. The expected result is Negative.  Fact Sheet for Patients: SugarRoll.be  Fact Sheet for Healthcare Providers: https://www.woods-mathews.com/  This test is not yet approved or cleared by the [place] FDA and  has been authorized for detection and/or diagnosis of SARS-CoV-2 by FDA under an  Emergency Use Authorization (EUA). This EUA will remain  in effect (meaning this test can be used) for the duration of the COVID-19 declaration under Se ction 564(b)(1) of the Act, 21 U.S.C. section 360bbb-3(b)(1), unless the authorization is terminated or revoked sooner.  Performed at [facility], [place] [address] [place], [place] [address]     RADIOLOGY:  No results found.   CODE STATUS:     Code Status Orders  (From admission, onward)         Start     Ordered   06/09/20 1804  Do not attempt resuscitation (DNR)  Continuous       Question Answer Comment  In the event of cardiac or respiratory ARREST Do not call a "code blue"   In the event of cardiac or respiratory ARREST Do not perform Intubation, CPR, defibrillation or ACLS   In the event of cardiac or respiratory ARREST Use medication by any route, position, wound care, and other measures to relive pain and suffering. May use oxygen, suction and manual treatment of airway obstruction as needed for comfort.      06/09/20 1803        Code Status History    Date Active Date Inactive Code Status Order ID Comments User Context   04/25/2020 1957 04/28/2020 2105 Full Code [id number]  Rhetta Mura, DO ED   01/04/2020 1001 01/15/2020 1954 Full Code [id number]  Collier Bullock, MD ED   01/04/2020 0600 01/04/2020 1001 Full Code [id number]  Athena Masse, MD ED   12/08/2019 1404 12/09/2019 2112 Full Code [id number]  Collier Bullock, MD ED   11/19/2019 2204 11/24/2019 1800 Full Code [id number]  Mansy, Arvella Merles, MD ED   10/22/2019 2231 10/31/2019 0440 Full Code [id number]  Elwyn Reach, MD ED   10/10/2019 0723 10/10/2019 1917 Full Code [id number]  Ivor Costa, MD  ED   10/03/2019 0919 10/04/2019 2033 Full Code [id number]  Collier Bullock, MD Inpatient   09/07/2019 0733 09/13/2019 0002 DNR [id number]  Christel Mormon, MD ED   09/07/2019 0256 09/07/2019 0733 DNR [id number]  Sidney Ace, Arvella Merles, MD ED   08/01/2019 1102 08/03/2019 2223 DNR [id number]   Jimmy Footman, NP ED   07/11/2019 1242 07/13/2019 2316 DNR [id number]  Asencion Gowda, NP Inpatient   07/09/2019 2123 07/11/2019 1242 Partial Code [id number]  Neena Rhymes, MD ED   07/09/2019 2014 07/09/2019 2122 Full Code [id number]  Norins, Heinz Knuckles, MD ED   Advance Care Planning Activity    Advance Directive Documentation   Flowsheet Row Most Recent Value  Type of Advance Directive Healthcare Power of Attorney, Living will  Pre-existing out of facility DNR order (yellow form or pink MOST form) --  "MOST" Form in Place? --       TOTAL TIME TAKING CARE OF THIS PATIENT: 35* minutes.    Fritzi Mandes M.D  [facility]    CC: Primary care physician; Alfredia Client, MD

## 2020-06-13 NOTE — Progress Notes (Signed)
Karla Sanchez to be D/C'd Home per MD order.  Discussed prescriptions and follow up appointments with the patient. [facility], medication list explained in detail. Pt verbalized understanding.  Skin clean, dry and intact without evidence of skin break down, no evidence of skin tears noted. IV catheter discontinued intact. Site without signs and symptoms of complications. Dressing and pressure applied. Pt denies pain at this time. No complaints noted.  An After Visit Summary was printed and given to the patient.Pt awaiting transportation, would like to eat lunch before she goes.  [name]

## 2020-06-13 NOTE — Progress Notes (Signed)
SATURATION QUALIFICATIONS: (This note is used to comply with regulatory documentation for home oxygen)  Patient Saturations on Room Air at Rest = 91%  Patient Saturations on Room Air while Ambulating = n/a%  Patient Saturations on n/a Liters of oxygen while Ambulating = n/a%  Please briefly explain why patient needs home oxygen:

## 2020-06-13 NOTE — Progress Notes (Signed)
Patient refusing to have vitals done. She does not want to be woken up until breakfast comes.

## 2020-06-13 NOTE — TOC Transition Note (Signed)
Transition of Care [facility]) - CM/SW Discharge Note   Patient Details  Name: Karla Sanchez MRN: [id number] Date of Birth: May 14, 1940  [facility] of [facility] CM/SW Contact:  Kerin Salen, RN Phone Number: 06/13/2020, 12:52 PM   Clinical Narrative:  Patient to discharge today, denies SNF acceptance for [facility] and [facility] Patient states she will return to [facility]. Patient states staff is nice to her, she's been there now over a year. Will think about SNF at a later time, may move back to [facility]. [employer] called will transport at 2pm. TOC needs resolved.     Final next level of care: Other (comment) ([facility] per patients request.) Barriers to Discharge: Other (comment) (Patient refused SNF offers.)   Patient Goals and CMS Choice Patient states their goals for this hospitalization and ongoing recovery are:: To return to [facility], do not want SNF at this time. CMS Medicare.gov Compare Post Acute Care list provided to:: Patient Choice offered to / list presented to : Patient  Discharge Placement                Patient to be transferred to facility by: Georgeann Oppenheim Name of family member notified: Patient did not want to call family.    Discharge Plan and Services In-house Referral: Clinical Social Work Discharge Planning Services: NA Post Acute Care Choice: Skilled Nursing Facility          DME Arranged: N/A DME Agency: NA       HH Arranged: NA HH Agency: NA        Social Determinants of Health (SDOH) Interventions     Readmission Risk Interventions Readmission Risk Prevention Plan 06/10/2020 10/24/2019  Transportation Screening Complete -  Medication Review Press photographer) Complete Complete  PCP or Specialist appointment within 3-5 days of discharge Complete -  [place] or Home Care Consult Complete Complete  SW Recovery Care/Counseling Consult Complete -  Palliative Care Screening Not Applicable -  [place] Complete Patient Refused

## 2020-07-08 ENCOUNTER — Ambulatory Visit: Payer: Federal, State, Local not specified - PPO | Admitting: Family

## 2020-08-13 ENCOUNTER — Other Ambulatory Visit: Payer: Self-pay

## 2020-08-13 ENCOUNTER — Encounter: Payer: Self-pay | Admitting: Emergency Medicine

## 2020-08-13 ENCOUNTER — Emergency Department: Payer: Medicare Other

## 2020-08-13 ENCOUNTER — Inpatient Hospital Stay
Admission: EM | Admit: 2020-08-13 | Discharge: 2020-08-16 | DRG: 291 | Disposition: A | Discharge status: Home / Self Care | Payer: Medicare Other | Attending: Internal Medicine | Admitting: Internal Medicine

## 2020-08-13 DIAGNOSIS — J9601 Acute respiratory failure with hypoxia: Secondary | ICD-10-CM | POA: Diagnosis present

## 2020-08-13 DIAGNOSIS — Z9049 Acquired absence of other specified parts of digestive tract: Secondary | ICD-10-CM | POA: Diagnosis not present

## 2020-08-13 DIAGNOSIS — Z85038 Personal history of other malignant neoplasm of large intestine: Secondary | ICD-10-CM | POA: Diagnosis not present

## 2020-08-13 DIAGNOSIS — I509 Heart failure, unspecified: Secondary | ICD-10-CM

## 2020-08-13 DIAGNOSIS — Z20822 Contact with and (suspected) exposure to covid-19: Secondary | ICD-10-CM | POA: Diagnosis present

## 2020-08-13 DIAGNOSIS — Z79899 Other long term (current) drug therapy: Secondary | ICD-10-CM | POA: Diagnosis not present

## 2020-08-13 DIAGNOSIS — I2510 Atherosclerotic heart disease of native coronary artery without angina pectoris: Secondary | ICD-10-CM | POA: Diagnosis present

## 2020-08-13 DIAGNOSIS — R3915 Urgency of urination: Secondary | ICD-10-CM | POA: Diagnosis present

## 2020-08-13 DIAGNOSIS — Z7982 Long term (current) use of aspirin: Secondary | ICD-10-CM | POA: Diagnosis not present

## 2020-08-13 DIAGNOSIS — N1832 Chronic kidney disease, stage 3b: Secondary | ICD-10-CM | POA: Diagnosis present

## 2020-08-13 DIAGNOSIS — N1831 Chronic kidney disease, stage 3a: Secondary | ICD-10-CM | POA: Diagnosis not present

## 2020-08-13 DIAGNOSIS — Z888 Allergy status to other drugs, medicaments and biological substances status: Secondary | ICD-10-CM

## 2020-08-13 DIAGNOSIS — R350 Frequency of micturition: Secondary | ICD-10-CM | POA: Diagnosis present

## 2020-08-13 DIAGNOSIS — R531 Weakness: Secondary | ICD-10-CM | POA: Diagnosis present

## 2020-08-13 DIAGNOSIS — I452 Bifascicular block: Secondary | ICD-10-CM | POA: Diagnosis present

## 2020-08-13 DIAGNOSIS — E1122 Type 2 diabetes mellitus with diabetic chronic kidney disease: Secondary | ICD-10-CM | POA: Diagnosis present

## 2020-08-13 DIAGNOSIS — E119 Type 2 diabetes mellitus without complications: Secondary | ICD-10-CM

## 2020-08-13 DIAGNOSIS — I10 Essential (primary) hypertension: Secondary | ICD-10-CM | POA: Diagnosis present

## 2020-08-13 DIAGNOSIS — Z87440 Personal history of urinary (tract) infections: Secondary | ICD-10-CM

## 2020-08-13 DIAGNOSIS — Z794 Long term (current) use of insulin: Secondary | ICD-10-CM

## 2020-08-13 DIAGNOSIS — Z885 Allergy status to narcotic agent status: Secondary | ICD-10-CM

## 2020-08-13 DIAGNOSIS — I5033 Acute on chronic diastolic (congestive) heart failure: Secondary | ICD-10-CM | POA: Diagnosis present

## 2020-08-13 DIAGNOSIS — I130 Hypertensive heart and chronic kidney disease with heart failure and stage 1 through stage 4 chronic kidney disease, or unspecified chronic kidney disease: Principal | ICD-10-CM | POA: Diagnosis present

## 2020-08-13 DIAGNOSIS — M109 Gout, unspecified: Secondary | ICD-10-CM | POA: Diagnosis present

## 2020-08-13 DIAGNOSIS — E785 Hyperlipidemia, unspecified: Secondary | ICD-10-CM | POA: Diagnosis present

## 2020-08-13 DIAGNOSIS — Z801 Family history of malignant neoplasm of trachea, bronchus and lung: Secondary | ICD-10-CM

## 2020-08-13 DIAGNOSIS — J449 Chronic obstructive pulmonary disease, unspecified: Secondary | ICD-10-CM | POA: Diagnosis present

## 2020-08-13 LAB — BASIC METABOLIC PANEL
Anion gap: 10 (ref 5–15)
BUN: 34 mg/dL — ABNORMAL HIGH (ref 8–23)
CO2: 24 mmol/L (ref 22–32)
Calcium: 8.3 mg/dL — ABNORMAL LOW (ref 8.9–10.3)
Chloride: 106 mmol/L (ref 98–111)
Creatinine, Ser: 1.47 mg/dL — ABNORMAL HIGH (ref 0.44–1.00)
GFR, Estimated: 36 mL/min — ABNORMAL LOW (ref >60)
Glucose, Bld: 252 mg/dL — ABNORMAL HIGH (ref 70–99)
Potassium: 4.5 mmol/L (ref 3.5–5.1)
Sodium: 140 mmol/L (ref 135–145)

## 2020-08-13 LAB — CBC
HCT: 40 % (ref 36.0–46.0)
Hemoglobin: 12.5 g/dL (ref 12.0–15.0)
MCH: 26.8 pg (ref 26.0–34.0)
MCHC: 31.3 g/dL (ref 30.0–36.0)
MCV: 85.7 fL (ref 80.0–100.0)
Platelets: 257 10*3/uL (ref 150–400)
RBC: 4.67 MIL/uL (ref 3.87–5.11)
RDW: 15.4 % (ref 11.5–15.5)
WBC: 7.3 10*3/uL (ref 4.0–10.5)
nRBC: 0 % (ref 0.0–0.2)

## 2020-08-13 LAB — TROPONIN I (HIGH SENSITIVITY): Troponin I (High Sensitivity): 14 ng/L (ref <18)

## 2020-08-13 LAB — RESP PANEL BY RT-PCR (FLU A&B, COVID) ARPGX2
Influenza A by PCR: NEGATIVE
Influenza B by PCR: NEGATIVE
SARS Coronavirus 2 by RT PCR: NEGATIVE

## 2020-08-13 LAB — BRAIN NATRIURETIC PEPTIDE: B Natriuretic Peptide: 198.4 pg/mL — ABNORMAL HIGH (ref 0.0–100.0)

## 2020-08-13 MED ORDER — ALBUTEROL SULFATE (2.5 MG/3ML) 0.083% IN NEBU: 2.5000 mg | INHALATION_SOLUTION | RESPIRATORY_TRACT | Status: DC | PRN

## 2020-08-13 MED ORDER — FUROSEMIDE 10 MG/ML IJ SOLN
80.0000 mg | Freq: Once | INTRAMUSCULAR | Status: AC
  Administered 2020-08-14: 80 mg via INTRAVENOUS
  Filled 2020-08-13: qty 8

## 2020-08-13 MED ORDER — ALBUTEROL SULFATE HFA 108 (90 BASE) MCG/ACT IN AERS: 2.0000 | INHALATION_SPRAY | RESPIRATORY_TRACT | Status: DC | PRN

## 2020-08-13 NOTE — ED Provider Notes (Signed)
[facility] Emergency Department Provider Note ____________________________________________   Event Date/Time   First MD Initiated Contact with Patient 08/13/20 2037     (approximate)  I have reviewed the triage vital signs and the nursing notes.   HISTORY  Chief Complaint Shortness of Breath    HPI Karla Sanchez is a 80 y.o. female with PMH as noted below including CHF, COPD, diabetes, gout, and hypertension who presents with persistent shortness of breath and swelling, and is requesting placement in a nursing facility.  The patient states that she has been hospitalized several times in the last few months, most recently last week in [place].  The patient has no permanent housing and has been living out of her car.  She states that she was admitted for a week for CHF and swelling and had minimal improvement.  She then drove herself here.  She states that she had been staying in [place], but is from [place] originally and has acknowledged that she needs to go to a skilled nursing facility, so she came back to her hometown and presented here so that she could be placed.  The patient reports persistent shortness of breath and bilateral lower extremity swelling although states that these symptoms are chronic and not particularly worse in the last few days.  She also reports chronic itching and irritation to the skin of her labia and inner buttocks.  She states that she has been on cream for this without relief.  She was recently treated for UTI.  She has no vaginal bleeding or discharge.   Past Medical History:  Diagnosis Date  . CHF (congestive heart failure) ([place])   . Colon cancer [facility])    s/p right and left colectomy 2020  . COPD (chronic obstructive pulmonary disease) ([place])   . Diabetes mellitus without complication ([place])   . Diastolic heart failure ([employer])   . Gout   . Hypertension     Patient Active Problem List   Diagnosis Date Noted  .  Acute CHF (congestive heart failure) ([place]) 08/13/2020  . Acute on chronic diastolic heart failure ([place]) 04/25/2020  . SOB (shortness of breath) 04/25/2020  . Elevated troponin 04/25/2020  . B12 deficiency   . Short-term memory loss   . Weakness   . Fungal skin infection   . Obesity (BMI 30-39.9)   . Cellulitis of right leg 01/04/2020  . COPD with acute exacerbation ([place]) 12/08/2019  . Physical deconditioning 12/08/2019  . Acute respiratory failure with hypoxia ([place])   . Iron deficiency anemia   . Type 2 diabetes mellitus without complication, with long-term current use of insulin ([place])   . CHF exacerbation ([place]) 11/20/2019  . Acute on chronic diastolic (congestive) heart failure ([place]) 11/19/2019  . Acute pulmonary edema ([facility])   . Acute on chronic diastolic CHF (congestive heart failure) ([place]) 10/22/2019  . Cellulitis of right foot 10/22/2019  . Non compliance with medical treatment   . Chronic respiratory failure with hypoxia ([place])   . Hypertension   . Chronic kidney disease, stage 3b ([place])   . Acute on chronic respiratory failure with hypoxia ([place])   . Diarrhea   . COPD (chronic obstructive pulmonary disease) ([place]) 10/03/2019  . Obesity, Class III, BMI 40-49.9 (morbid obesity) ([place]) 10/03/2019  . Chronic diastolic CHF (congestive heart failure) ([place]) 07/09/2019  . DM2 (diabetes mellitus, type 2) ([place]) 07/09/2019  . Peripheral edema 07/09/2019  . Gout 07/09/2019  . Obesity, diabetes, and hypertension syndrome ([place]) 07/09/2019  .  Anemia of chronic disease 07/09/2019    Past Surgical History:  Procedure Laterality Date  . COLON SURGERY    . TUBAL LIGATION  1970    Prior to Admission medications   Medication Sig Start Date End Date Taking? Authorizing Provider  acetaminophen (TYLENOL) 325 MG tablet Take 650 mg by mouth every 6 (six) hours as needed for mild pain or fever.     [provider]  albuterol (VENTOLIN HFA) 108 (90 Base) MCG/ACT inhaler Inhale 2 puffs into  the lungs every 6 (six) hours as needed for wheezing or shortness of breath. 01/09/20   Loletha Grayer, MD  aspirin EC 81 MG tablet Take 1 tablet (81 mg total) by mouth daily. Swallow whole. Patient not taking: No sig reported 01/09/20   Loletha Grayer, MD  atorvastatin (LIPITOR) 20 MG tablet Take 1 tablet (20 mg total) by mouth daily. 06/14/20   Fritzi Mandes, MD  colchicine 0.6 MG tablet Take 1 tablet (0.6 mg total) by mouth daily as needed (acute gout flare). 06/13/20   Fritzi Mandes, MD  docusate sodium (COLACE) 100 MG capsule Take 1 capsule (100 mg total) by mouth at bedtime. 01/09/20   Loletha Grayer, MD  ferrous sulfate 325 (65 FE) MG tablet Take 1 tablet (325 mg total) by mouth daily. Patient not taking: No sig reported 01/09/20   Loletha Grayer, MD  furosemide (LASIX) 40 MG tablet Take 1 tablet (40 mg total) by mouth 2 (two) times daily. 06/13/20   Fritzi Mandes, MD  Insulin Pen Needle 31G X 8 MM MISC 1 Dose by Does not apply route 3 (three) times daily before meals. 01/09/20   Loletha Grayer, MD  isosorbide mononitrate (IMDUR) 30 MG 24 hr tablet Take 1 tablet (30 mg total) by mouth daily. 06/14/20   Fritzi Mandes, MD  LEVEMIR 100 UNIT/ML injection Inject 0.15 mLs (15 Units total) into the skin daily. 06/13/20   Fritzi Mandes, MD  liver oil-zinc oxide (DESITIN) 40 % ointment Apply 1 application topically in the morning and at bedtime. Uses on buttock 01/09/20   Loletha Grayer, MD  metoprolol tartrate (LOPRESSOR) 25 MG tablet Take 1 tablet (25 mg total) by mouth 2 (two) times daily. 06/13/20   Fritzi Mandes, MD  nystatin (MYCOSTATIN/NYSTOP) powder Apply topically 2 (two) times daily. 01/09/20   Loletha Grayer, MD  potassium chloride (KLOR-CON) 10 MEQ tablet Take 1 tablet (10 mEq total) by mouth daily. 06/13/20 06/13/21  Fritzi Mandes, MD  traZODone (DESYREL) 50 MG tablet Take 1 tablet (50 mg total) by mouth at bedtime as needed for sleep. 01/09/20   Loletha Grayer, MD    Allergies Codeine,  Other, Exenatide, Levofloxacin, Losartan, and Tetracyclines & related  Family History  Problem Relation Age of Onset  . Lung cancer Father     Social History Social History   Tobacco Use  . Smoking status: Never Smoker  . Smokeless tobacco: Never Used  Vaping Use  . Vaping Use: Never used  Substance Use Topics  . Alcohol use: Not Currently  . Drug use: Never    Review of Systems  Constitutional: No fever/chills. Eyes: No visual changes. ENT: No sore throat. Cardiovascular: Denies chest pain. Respiratory: Positive for shortness of breath. Gastrointestinal: No vomiting or diarrhea.  Genitourinary: Negative for dysuria.  Musculoskeletal: Negative for back pain. Skin: Positive for rash. Neurological: Negative for headaches, focal weakness or numbness.   ____________________________________________   PHYSICAL EXAM:  VITAL SIGNS: ED Triage Vitals  Enc Vitals Group  BP 08/13/20 1751 103/90     Pulse Rate 08/13/20 1751 77     Resp 08/13/20 1751 (!) 24     Temp 08/13/20 1751 98.6 F (37 C)     Temp Source 08/13/20 1751 Oral     SpO2 08/13/20 1751 93 %     Weight 08/13/20 1749 261 lb 3.9 oz (118.5 kg)     Height 08/13/20 1749 5\' 6"  (1.676 m)     Head Circumference --      Peak Flow --      Pain Score 08/13/20 1750 8     Pain Loc --      Pain Edu? --      Excl. in [place]? --     Constitutional: Alert and oriented.  Somewhat chronically ill-appearing but relatively comfortable and in no acute distress. Eyes: Conjunctivae are normal.  Head: Atraumatic. Nose: No congestion/rhinnorhea. Mouth/Throat: Mucous membranes are moist.   Neck: Normal range of motion.  Cardiovascular: Normal rate, regular rhythm. Grossly normal heart sounds.  Good peripheral circulation. Respiratory: Normal respiratory effort.  No retractions.  Somewhat diminished breath sounds bilaterally. Gastrointestinal: Soft and nontender. No distention.  Genitourinary: Faint erythema to the labial skin  especially on the left.  Mild edema of the labial soft tissues with no focal tenderness, palpable mass, fluctuance, or purulent drainage. Musculoskeletal: 1+ bilateral lower extremity edema.  Extremities warm and well perfused.  Neurologic:  Normal speech and language. No gross focal neurologic deficits are appreciated.  Skin:  Skin is warm and dry.  Faint erythema and raw appearing skin to bilateral inner buttocks with no open wounds or lesions or purulent drainage. Psychiatric: Mood and affect are normal. Speech and behavior are normal.  ____________________________________________   LABS (all labs ordered are listed, but only abnormal results are displayed)  Labs Reviewed  BASIC METABOLIC PANEL - Abnormal; Notable for the following components:      Result Value   Glucose, Bld 252 (*)    BUN 34 (*)    Creatinine, Ser 1.47 (*)    Calcium 8.3 (*)    GFR, Estimated 36 (*)    All other components within normal limits  BRAIN NATRIURETIC PEPTIDE - Abnormal; Notable for the following components:   B Natriuretic Peptide 198.4 (*)    All other components within normal limits  RESP PANEL BY RT-PCR (FLU A&B, COVID) ARPGX2  CBC  URINALYSIS, COMPLETE (UACMP) WITH MICROSCOPIC  TROPONIN I (HIGH SENSITIVITY)  TROPONIN I (HIGH SENSITIVITY)   ____________________________________________  EKG  ED ECG REPORT I, Arta Silence, the attending physician, personally viewed and interpreted this ECG.  Date: 08/13/2020 EKG Time: 1755 Rate: 71 Rhythm: normal sinus rhythm QRS Axis: normal Intervals: RBBB, LAFB ST/T Wave abnormalities: Nonspecific abnormalities Narrative Interpretation: Nonspecific abnormalities with no evidence of acute ischemia; no significant change when compared to EKG of 06/09/2020  ____________________________________________  RADIOLOGY  Chest x-ray interpreted by me shows mild pulmonary vascular congestion with no focal consolidation or  edema  ____________________________________________   PROCEDURES  Procedure(s) performed: No  Procedures  Critical Care performed: No ____________________________________________   INITIAL IMPRESSION / ASSESSMENT AND PLAN / ED COURSE  Pertinent labs & imaging results that were available during my care of the patient were reviewed by me and considered in my medical decision making (see chart for details).  80 year old female with PMH as noted above including CHF, COPD, diabetes, gout, and hypertension presents with persistent chronic symptoms of shortness of breath and lower extremity swelling after recent  admissions.  She primarily is here to seek nursing facility placement.  She is currently living out of her car and had been staying in [place].  I reviewed the past medical records in [place].  The patient was most recently admitted here in late March with acute on chronic CHF and COPD exacerbations.  She was also just admitted at [facility] with acute on chronic CHF, and discharged on 5/18.  At that time she was offered social work evaluation for placement but refused and insisted on going back to her [name] that was parked at another hospital.  On exam today the patient is overall comfortable appearing.  Her vital signs are normal except for hypertension.  O2 saturation is in low to mid 90s on room air.  She has no increased work of breathing or respiratory distress.  Breath sounds are slightly diminished bilaterally but there are no frank rales.  She has very mild bilateral lower extremity edema.  The labia, inner thighs, and inner buttocks show slightly erythematous, irritated skin but no open lesions or wounds.  1.  CHF: Currently the patient has no respiratory distress, significant increased work of breathing or hypoxia.  Peripheral edema appears mild.  Chest x-ray shows no significant pulmonary edema.  Overall this appears to be chronic CHF with no acute exacerbation.  I  have added on troponin and BNP for further evaluation.  However, I do not suspect that she will require admission at this time.  2.  Rash: The skin of the labia and inner thighs and buttocks appears dry and irritated.  The patient has been applying Desitin and states she had some prescription cream which did not help.  Overall this appears to be chronically dry and irritated skin likely due to urinary incontinence.  Fungal infection is also possible.  We will prescribe topical ointment.  3.  Disposition: The patient currently is staying in a car.  She has multiple medical comorbidities.  She has been offered social work evaluation for placement in the past.  She now appears to be willing to consider SNF placement.  If she does not end up requiring inpatient admission, I will consult social work team for evaluation.  ----------------------------------------- 11:45 PM on 08/13/2020 -----------------------------------------  The patient has had several episodes of the O2 saturation going down to the 80s, so she will need admission for diuresis and further treatment of CHF before she can be placed.  I consulted Dr. Sidney Ace from the hospitalist service.  ____________________________________________   FINAL CLINICAL IMPRESSION(S) / ED DIAGNOSES  Final diagnoses:  Acute on chronic congestive heart failure, unspecified heart failure type ([place])      NEW MEDICATIONS STARTED DURING THIS VISIT:  New Prescriptions   No medications on file     Note:  This document was prepared using Dragon voice recognition software and may include unintentional dictation errors.    Arta Silence, MD 08/13/20 [phone]

## 2020-08-13 NOTE — ED Triage Notes (Signed)
Pt c/o shortness of breath x several years, and "my crotch is so raw", pt states uses desitin and dial soap without relief. Pt states drove here today [facility], pt states is moving back to [place] and will need to move into SNF. Pt able to speak in full and complete sentences without difficulty.

## 2020-08-13 NOTE — H&P (Signed)
[place]   PATIENT NAME: Karla Sanchez    MR#:  [id number]  DATE OF BIRTH:  [birth year]  DATE OF ADMISSION:  08/13/2020  PRIMARY CARE PHYSICIAN: Alfredia Client, MD   Patient is coming from: Home.  REQUESTING/REFERRING PHYSICIAN: Arta Silence, MD  CHIEF COMPLAINT:   Chief Complaint  Patient presents with  . Shortness of Breath    HISTORY OF PRESENT ILLNESS:  Karla Sanchez is a 80 y.o. Caucasian female with medical history significant for diastolic CHF, COPD, type diabetes mellitus, gout and hypertension, who presented to the ER with acute onset of worsening dyspnea as well as lower extremity edema with associated orthopnea and occasional paroxysmal nocturnal dyspnea.  She has been having mild dry cough without wheezing or hemoptysis.  No fever or chills.  No headache or dizziness or blurred vision.  She admitted to urinary frequency and urgency without dysuria, or hematuria or flank pain.  No nausea or vomiting or abdominal pain.  ED Course: Upon presentation to the ER blood pressure was 103/90 with 24 with 93% on room air. Pulse oximetry has dropped to 88% on room air and it was up to 96% on 2 L of O2 by nasal cannula..  EKG as reviewed by me : Showed sinus rhythm with rate of 71 with first-degree AV block with PACs, right bundle branch block and left anterior fascicular block and minimal voltage criteria for LVH  Imaging: Two-view chest x-ray showed mild pulm vascular congestion without frank edema, coarse interstitial changes, bronchitic features and hyperinflation compatible with history of COPD.  It showed aortic atherosclerosis. PAST MEDICAL HISTORY:   Past Medical History:  Diagnosis Date  . CHF (congestive heart failure) ([place])   . Colon cancer [facility])    s/p right and left colectomy 2020  . COPD (chronic obstructive pulmonary disease) ([place])   . Diabetes mellitus without complication ([place])   . Diastolic heart failure ([place])   . Gout    . Hypertension     PAST SURGICAL HISTORY:   Past Surgical History:  Procedure Laterality Date  . COLON SURGERY    . TUBAL LIGATION  1970    SOCIAL HISTORY:   Social History   Tobacco Use  . Smoking status: Never Smoker  . Smokeless tobacco: Never Used  Substance Use Topics  . Alcohol use: Not Currently    FAMILY HISTORY:   Family History  Problem Relation Age of Onset  . Lung cancer Father     DRUG ALLERGIES:   Allergies  Allergen Reactions  . Codeine Nausea Only, Other (See Comments) and Swelling    Other Reaction: nausea and vomiting Other reaction(s): NAUSEA   . Other Other (See Comments)    Other reaction(s): SWELLING  Other reaction(s): MUSCLE PAIN Muscle aches Other reaction(s): MUSCLE PAIN Muscle aches Other reaction(s): MUSCLE PAIN Muscle aches   . Exenatide Nausea And Vomiting  . Levofloxacin Other (See Comments)    Other Reaction: sloughing of buccal mucosa Other reaction(s): OTHER Lost skin in mouth Patient states she can take Cipro without difficulty and has taken it many times in the past Other Reaction: sloughing of buccal mucosa   . Losartan Other (See Comments)    Weight gain Weight gain   . Tetracyclines & Related Other (See Comments)    REVIEW OF SYSTEMS:   ROS As per history of present illness. All pertinent systems were reviewed above. Constitutional, HEENT, cardiovascular, respiratory, GI, GU, musculoskeletal, neuro, psychiatric, endocrine, integumentary and hematologic  systems were reviewed and are otherwise negative/unremarkable except for positive findings mentioned above in the HPI.   MEDICATIONS AT HOME:   Prior to Admission medications   Medication Sig Start Date End Date Taking? Authorizing Provider  acetaminophen (TYLENOL) 325 MG tablet Take 650 mg by mouth every 6 (six) hours as needed for mild pain or fever.     [provider]  albuterol (VENTOLIN HFA) 108 (90 Base) MCG/ACT inhaler Inhale 2 puffs into  the lungs every 6 (six) hours as needed for wheezing or shortness of breath. 01/09/20   Loletha Grayer, MD  aspirin EC 81 MG tablet Take 1 tablet (81 mg total) by mouth daily. Swallow whole. Patient not taking: No sig reported 01/09/20   Loletha Grayer, MD  atorvastatin (LIPITOR) 20 MG tablet Take 1 tablet (20 mg total) by mouth daily. 06/14/20   Fritzi Mandes, MD  colchicine 0.6 MG tablet Take 1 tablet (0.6 mg total) by mouth daily as needed (acute gout flare). 06/13/20   Fritzi Mandes, MD  docusate sodium (COLACE) 100 MG capsule Take 1 capsule (100 mg total) by mouth at bedtime. 01/09/20   Loletha Grayer, MD  ferrous sulfate 325 (65 FE) MG tablet Take 1 tablet (325 mg total) by mouth daily. Patient not taking: No sig reported 01/09/20   Loletha Grayer, MD  furosemide (LASIX) 40 MG tablet Take 1 tablet (40 mg total) by mouth 2 (two) times daily. 06/13/20   Fritzi Mandes, MD  Insulin Pen Needle 31G X 8 MM MISC 1 Dose by Does not apply route 3 (three) times daily before meals. 01/09/20   Loletha Grayer, MD  isosorbide mononitrate (IMDUR) 30 MG 24 hr tablet Take 1 tablet (30 mg total) by mouth daily. 06/14/20   Fritzi Mandes, MD  LEVEMIR 100 UNIT/ML injection Inject 0.15 mLs (15 Units total) into the skin daily. 06/13/20   Fritzi Mandes, MD  liver oil-zinc oxide (DESITIN) 40 % ointment Apply 1 application topically in the morning and at bedtime. Uses on buttock 01/09/20   Loletha Grayer, MD  metoprolol tartrate (LOPRESSOR) 25 MG tablet Take 1 tablet (25 mg total) by mouth 2 (two) times daily. 06/13/20   Fritzi Mandes, MD  nystatin (MYCOSTATIN/NYSTOP) powder Apply topically 2 (two) times daily. 01/09/20   Loletha Grayer, MD  potassium chloride (KLOR-CON) 10 MEQ tablet Take 1 tablet (10 mEq total) by mouth daily. 06/13/20 06/13/21  Fritzi Mandes, MD  traZODone (DESYREL) 50 MG tablet Take 1 tablet (50 mg total) by mouth at bedtime as needed for sleep. 01/09/20   Wieting, Richard, MD      VITAL SIGNS:  Blood  pressure (!) 166/63, pulse 77, temperature 98.6 F (37 C), temperature source Oral, resp. rate (!) 21, height 5\' 6"  (1.676 m), weight 118.5 kg, SpO2 94 %.  PHYSICAL EXAMINATION:  Physical Exam  GENERAL:  80 y.o.-year-old Caucasian female patient lying in the bed with mild respiratory distress with conversational dyspnea. EYES: Pupils equal, round, reactive to light and accommodation. No scleral icterus. Extraocular muscles intact.  HEENT: Head atraumatic, normocephalic. Oropharynx and nasopharynx clear.  NECK:  Supple, no jugular venous distention. No thyroid enlargement, no tenderness.  LUNGS: Diminished bibasal breath sounds with bibasal rales. CARDIOVASCULAR: Regular rate and rhythm, S1, S2 normal. No murmurs, rubs, or gallops.  ABDOMEN: Soft, nondistended, nontender. Bowel sounds present. No organomegaly or mass.  EXTREMITIES: 1+ bilateral lower extremity pitting edema with no cyanosis, or clubbing.  NEUROLOGIC: Cranial nerves II through XII are intact. Muscle strength 5/5  in all extremities. Sensation intact. Gait not checked.  PSYCHIATRIC: The patient is alert and oriented x 3.  Normal affect and good eye contact. SKIN: No obvious rash, lesion, or ulcer.   LABORATORY PANEL:   CBC Recent Labs  Lab 08/13/20 1758  WBC 7.3  HGB 12.5  HCT 40.0  PLT 257   ------------------------------------------------------------------------------------------------------------------  Chemistries  Recent Labs  Lab 08/13/20 1758  NA 140  K 4.5  CL 106  CO2 24  GLUCOSE 252*  BUN 34*  CREATININE 1.47*  CALCIUM 8.3*   ------------------------------------------------------------------------------------------------------------------  Cardiac Enzymes No results for input(s): TROPONINI in the last 168 hours. ------------------------------------------------------------------------------------------------------------------  RADIOLOGY:  DG Chest 2 View  Result Date: 08/13/2020 CLINICAL  DATA:  Shortness of breath for several years EXAM: CHEST - 2 VIEW COMPARISON:  Radiograph 06/09/2020 FINDINGS: No new focal consolidation. Hyperinflation, coarsened interstitial and bronchitic features are similar to comparison priors. Mild pulmonary vascular congestion is noted as well without convincing features frank pulmonary edema at this time. Stable cardiomediastinal contours with a calcified aorta. No pneumothorax or effusion. Severe degenerative changes in the bilateral shoulders. Multilevel degenerative changes the spine as well. IMPRESSION: Mild pulmonary vascular congestion without frank edema. Coarsened interstitial changes, bronchitic features and hyperinflation compatible with patient history of COPD. Aortic Atherosclerosis (ICD10-I70.0). Electronically Signed   By: Lovena Le M.D.   On: 08/13/2020 18:18      IMPRESSION AND PLAN:  Active Problems:   Acute CHF (congestive heart failure) ([place])  1.  Acute on chronic diastolic CHF with subsequent acute hypoxic respiratory failure. - The patient will be admitted to a progressive unit bed. - She will be diuresed with IV Lasix. - We will follow serial troponin I's. - She had a 2D echo on 06/10/2020 that showed EF of 55-60% with grade 1 diastolic function, mild left axis deviation and mild mitral regurgitation. - Cardiology consult will be obtained. - Notify Dr. Nehemiah Massed about the patient. - Case management consult to be obtained for SNF discharge  2.  Essential hypertension. - We will continue amlodipine, Lopressor and hydralazine.  3.  Dyslipidemia. - We will continue statin therapy.  4.  Gout. - We will continue allopurinol and colchicine.  5.  Type II diabetes mellitus. - The patient will be placed on supplemental coverage with NovoLog. - We will continue basal coverage.  6.  Coronary artery disease. - We will continue Imdur and as needed sublingual nitroglycerin.  7.  COPD. - We will continue albuterol and hold off  long-acting beta agonist.  DVT prophylaxis: Lovenox. Code Status: Partial code.  The patient is DNI only. Family Communication:  The plan of care was discussed in details with the patient (who requested no other family members to be notified). I answered all questions. The patient agreed to proceed with the above mentioned plan. Further management will depend upon hospital course. Disposition Plan: Back to previous home environment Consults called: Cardiology consult. All the records are reviewed and case discussed with ED provider.  Status is: Inpatient  Remains inpatient appropriate because:Ongoing diagnostic testing needed not appropriate for outpatient work up, Unsafe d/c plan, IV treatments appropriate due to intensity of illness or inability to take PO and Inpatient level of care appropriate due to severity of illness   Dispo: The patient is from: Home              Anticipated d/c is to: Home              Patient currently  is not medically stable to d/c.   Difficult to place patient No   TOTAL TIME TAKING CARE OF THIS PATIENT: 55 minutes.    Christel Mormon M.D on 08/13/2020 at 11:47 PM  [facility]   From 7 PM-7 AM, contact night-coverage [email or web address]  CC: Primary care physician; Alfredia Client, MD

## 2020-08-14 ENCOUNTER — Encounter: Payer: Self-pay | Admitting: Family Medicine

## 2020-08-14 DIAGNOSIS — J9601 Acute respiratory failure with hypoxia: Secondary | ICD-10-CM

## 2020-08-14 DIAGNOSIS — N1832 Chronic kidney disease, stage 3b: Secondary | ICD-10-CM | POA: Diagnosis not present

## 2020-08-14 DIAGNOSIS — N1831 Chronic kidney disease, stage 3a: Secondary | ICD-10-CM

## 2020-08-14 DIAGNOSIS — E1122 Type 2 diabetes mellitus with diabetic chronic kidney disease: Secondary | ICD-10-CM | POA: Diagnosis not present

## 2020-08-14 DIAGNOSIS — Z794 Long term (current) use of insulin: Secondary | ICD-10-CM

## 2020-08-14 DIAGNOSIS — E785 Hyperlipidemia, unspecified: Secondary | ICD-10-CM | POA: Diagnosis not present

## 2020-08-14 DIAGNOSIS — I5033 Acute on chronic diastolic (congestive) heart failure: Secondary | ICD-10-CM | POA: Diagnosis not present

## 2020-08-14 DIAGNOSIS — I10 Essential (primary) hypertension: Secondary | ICD-10-CM | POA: Diagnosis not present

## 2020-08-14 DIAGNOSIS — J449 Chronic obstructive pulmonary disease, unspecified: Secondary | ICD-10-CM

## 2020-08-14 LAB — URINALYSIS, COMPLETE (UACMP) WITH MICROSCOPIC
Bilirubin Urine: NEGATIVE
Glucose, UA: 150 mg/dL — AB
Hgb urine dipstick: NEGATIVE
Ketones, ur: NEGATIVE mg/dL
Nitrite: POSITIVE — AB
Protein, ur: >=300 mg/dL — AB
Specific Gravity, Urine: 1.014 (ref 1.005–1.030)
pH: 5 (ref 5.0–8.0)

## 2020-08-14 LAB — CBC
HCT: 39.5 % (ref 36.0–46.0)
Hemoglobin: 12.2 g/dL (ref 12.0–15.0)
MCH: 26.8 pg (ref 26.0–34.0)
MCHC: 30.9 g/dL (ref 30.0–36.0)
MCV: 86.6 fL (ref 80.0–100.0)
Platelets: 228 10*3/uL (ref 150–400)
RBC: 4.56 MIL/uL (ref 3.87–5.11)
RDW: 15.6 % — ABNORMAL HIGH (ref 11.5–15.5)
WBC: 7.4 10*3/uL (ref 4.0–10.5)
nRBC: 0 % (ref 0.0–0.2)

## 2020-08-14 LAB — BASIC METABOLIC PANEL
Anion gap: 8 (ref 5–15)
BUN: 33 mg/dL — ABNORMAL HIGH (ref 8–23)
CO2: 26 mmol/L (ref 22–32)
Calcium: 8.4 mg/dL — ABNORMAL LOW (ref 8.9–10.3)
Chloride: 106 mmol/L (ref 98–111)
Creatinine, Ser: 1.38 mg/dL — ABNORMAL HIGH (ref 0.44–1.00)
GFR, Estimated: 39 mL/min — ABNORMAL LOW (ref >60)
Glucose, Bld: 254 mg/dL — ABNORMAL HIGH (ref 70–99)
Potassium: 4.3 mmol/L (ref 3.5–5.1)
Sodium: 140 mmol/L (ref 135–145)

## 2020-08-14 LAB — HEMOGLOBIN A1C
Hgb A1c MFr Bld: 8.5 % — ABNORMAL HIGH (ref 4.8–5.6)
Mean Plasma Glucose: 197 mg/dL

## 2020-08-14 LAB — GLUCOSE, CAPILLARY
Glucose-Capillary: 197 mg/dL — ABNORMAL HIGH (ref 70–99)
Glucose-Capillary: 208 mg/dL — ABNORMAL HIGH (ref 70–99)
Glucose-Capillary: 143 mg/dL — ABNORMAL HIGH (ref 70–99)
Glucose-Capillary: 182 mg/dL — ABNORMAL HIGH (ref 70–99)

## 2020-08-14 LAB — TROPONIN I (HIGH SENSITIVITY): Troponin I (High Sensitivity): 13 ng/L (ref <18)

## 2020-08-14 MED ORDER — HYDRALAZINE HCL 25 MG PO TABS
25.0000 mg | ORAL_TABLET | Freq: Three times a day (TID) | ORAL | Status: DC
  Administered 2020-08-14 – 2020-08-16 (×5): 25 mg via ORAL
  Filled 2020-08-14 (×5): qty 1

## 2020-08-14 MED ORDER — ISOSORBIDE MONONITRATE ER 30 MG PO TB24
30.0000 mg | ORAL_TABLET | Freq: Every day | ORAL | Status: DC
  Administered 2020-08-14 – 2020-08-16 (×3): 30 mg via ORAL
  Filled 2020-08-14 (×3): qty 1

## 2020-08-14 MED ORDER — INSULIN DETEMIR 100 UNIT/ML ~~LOC~~ SOLN
15.0000 [IU] | Freq: Every day | SUBCUTANEOUS | Status: DC
  Administered 2020-08-14 – 2020-08-16 (×3): 15 [IU] via SUBCUTANEOUS
  Filled 2020-08-14 (×4): qty 0.15

## 2020-08-14 MED ORDER — ENOXAPARIN SODIUM 60 MG/0.6ML IJ SOSY
0.5000 mg/kg | PREFILLED_SYRINGE | INTRAMUSCULAR | Status: DC
  Administered 2020-08-15 – 2020-08-16 (×2): 60 mg via SUBCUTANEOUS
  Filled 2020-08-14 (×4): qty 0.6

## 2020-08-14 MED ORDER — INSULIN ASPART 100 UNIT/ML IJ SOLN
0.0000 [IU] | Freq: Every day | INTRAMUSCULAR | Status: DC
  Administered 2020-08-15: 2 [IU] via SUBCUTANEOUS
  Filled 2020-08-14: qty 1

## 2020-08-14 MED ORDER — TRAZODONE HCL 50 MG PO TABS: 25.0000 mg | ORAL_TABLET | Freq: Every evening | ORAL | Status: DC | PRN

## 2020-08-14 MED ORDER — ONDANSETRON HCL 4 MG/2ML IJ SOLN: 4.0000 mg | Freq: Four times a day (QID) | INTRAMUSCULAR | Status: DC | PRN

## 2020-08-14 MED ORDER — ACETAMINOPHEN 650 MG RE SUPP: 650.0000 mg | Freq: Four times a day (QID) | RECTAL | Status: DC | PRN

## 2020-08-14 MED ORDER — COLCHICINE 0.6 MG PO TABS
0.6000 mg | ORAL_TABLET | Freq: Every day | ORAL | Status: DC | PRN
  Administered 2020-08-15: 0.6 mg via ORAL
  Filled 2020-08-14: qty 1

## 2020-08-14 MED ORDER — ZOLPIDEM TARTRATE 5 MG PO TABS
5.0000 mg | ORAL_TABLET | Freq: Every evening | ORAL | Status: DC | PRN
  Administered 2020-08-15: 5 mg via ORAL
  Filled 2020-08-14: qty 1

## 2020-08-14 MED ORDER — DOCUSATE SODIUM 100 MG PO CAPS: 100.0000 mg | ORAL_CAPSULE | Freq: Every day | ORAL | Status: DC | PRN

## 2020-08-14 MED ORDER — FUROSEMIDE 10 MG/ML IJ SOLN
40.0000 mg | Freq: Two times a day (BID) | INTRAMUSCULAR | Status: DC
  Administered 2020-08-14 – 2020-08-15 (×3): 40 mg via INTRAVENOUS
  Filled 2020-08-14 (×4): qty 4

## 2020-08-14 MED ORDER — ATORVASTATIN CALCIUM 20 MG PO TABS
20.0000 mg | ORAL_TABLET | Freq: Every day | ORAL | Status: DC
  Administered 2020-08-14 – 2020-08-16 (×3): 20 mg via ORAL
  Filled 2020-08-14 (×3): qty 1

## 2020-08-14 MED ORDER — TRAZODONE HCL 50 MG PO TABS
50.0000 mg | ORAL_TABLET | Freq: Every evening | ORAL | Status: DC | PRN
  Filled 2020-08-14: qty 1

## 2020-08-14 MED ORDER — ZINC OXIDE 40 % EX OINT
TOPICAL_OINTMENT | CUTANEOUS | Status: DC | PRN
  Filled 2020-08-14 (×2): qty 113

## 2020-08-14 MED ORDER — ONDANSETRON HCL 4 MG PO TABS: 4.0000 mg | ORAL_TABLET | Freq: Four times a day (QID) | ORAL | Status: DC | PRN

## 2020-08-14 MED ORDER — MAGNESIUM HYDROXIDE 400 MG/5ML PO SUSP
30.0000 mL | Freq: Every day | ORAL | Status: DC | PRN
  Administered 2020-08-16: 30 mL via ORAL
  Filled 2020-08-14: qty 30

## 2020-08-14 MED ORDER — POTASSIUM CHLORIDE CRYS ER 10 MEQ PO TBCR
10.0000 meq | EXTENDED_RELEASE_TABLET | Freq: Every day | ORAL | Status: DC
  Administered 2020-08-14 – 2020-08-16 (×3): 10 meq via ORAL
  Filled 2020-08-14 (×3): qty 1

## 2020-08-14 MED ORDER — HYDRALAZINE HCL 20 MG/ML IJ SOLN: 10.0000 mg | Freq: Four times a day (QID) | INTRAMUSCULAR | Status: DC | PRN

## 2020-08-14 MED ORDER — INSULIN ASPART 100 UNIT/ML IJ SOLN
0.0000 [IU] | Freq: Three times a day (TID) | INTRAMUSCULAR | Status: DC
  Administered 2020-08-14: 2 [IU] via SUBCUTANEOUS
  Administered 2020-08-14 – 2020-08-15 (×2): 5 [IU] via SUBCUTANEOUS
  Administered 2020-08-15: 3 [IU] via SUBCUTANEOUS
  Administered 2020-08-15 – 2020-08-16 (×3): 2 [IU] via SUBCUTANEOUS
  Filled 2020-08-14 (×7): qty 1

## 2020-08-14 MED ORDER — ACETAMINOPHEN 325 MG PO TABS
650.0000 mg | ORAL_TABLET | Freq: Four times a day (QID) | ORAL | Status: DC | PRN
  Administered 2020-08-15 – 2020-08-16 (×2): 650 mg via ORAL
  Filled 2020-08-14 (×3): qty 2

## 2020-08-14 MED ORDER — METOPROLOL TARTRATE 25 MG PO TABS
25.0000 mg | ORAL_TABLET | Freq: Two times a day (BID) | ORAL | Status: DC
  Administered 2020-08-14 – 2020-08-16 (×4): 25 mg via ORAL
  Filled 2020-08-14 (×6): qty 1

## 2020-08-14 MED ORDER — ALBUTEROL SULFATE HFA 108 (90 BASE) MCG/ACT IN AERS: 2.0000 | INHALATION_SPRAY | Freq: Four times a day (QID) | RESPIRATORY_TRACT | Status: DC | PRN

## 2020-08-14 MED ORDER — NYSTATIN 100000 UNIT/GM EX POWD
Freq: Two times a day (BID) | CUTANEOUS | Status: DC
  Filled 2020-08-14: qty 15

## 2020-08-14 NOTE — TOC Initial Note (Addendum)
Transition of Care [facility]) - Initial/Assessment Note    Patient Details  Name: Karla Sanchez MRN: [id number] Date of Birth: [birth year]  Transition of Care [facility] CM/SW Contact:    Alberteen Sam, LCSW Phone Number: 08/14/2020, 12:41 PM  Clinical Narrative:                  CSW met with patient at bedside to initiate discharge planning discussion.   Patient reports she is terminally ill and has no bones in her hands or feet, that she needs to go to SNF for long term. Patient reports being interested in a long term bed as she has no place to stay. Patient reports she has been living in her car and receives $2200 per month.   Patient reports prior to driving herself to this hospital, she was looking for apartments with a budget of $600-$700 per month for rent. Reports she drove herself to this hospital as she has family in the area including nephew [name], several nieces and cousins. Patient reports none of these family members are able to provide support to her upon discharge.   Patient mentioned previously being at [facility] in [facility]. Patient also Aware of [facility] locally.   Patient reports unsure of plan at discharge if unable to get into a SNF.   CSW called  [facility] in [place] at [phone], they report patient is in fact a resident at their facility and can return upon discharge. Fax FL2 and dc summary to [phone] when medically ready.    Expected Discharge Plan:  (TBD) Barriers to Discharge: Continued Medical Work up   Patient Goals and CMS Choice Patient states their goals for this hospitalization and ongoing recovery are:: to go to SNF CMS Medicare.gov Compare Post Acute Care list provided to:: Patient Choice offered to / list presented to : Patient  Expected Discharge Plan and Services Expected Discharge Plan:  (TBD)     Post Acute Care Choice: [place] Living arrangements for the past 2 months:  Marketing executive)                                       Prior Living Arrangements/Services Living arrangements for the past 2 months:  Marketing executive) Lives with:: Self Patient language and need for interpreter reviewed:: Yes Do you feel safe going back to the place where you live?: No   wants to go to SNF  Need for Family Participation in Patient Care: Yes (Comment) Care giver support system in place?: No (comment)   Criminal Activity/Legal Involvement Pertinent to Current Situation/Hospitalization: No - Comment as needed  Activities of Daily Living Home Assistive Devices/Equipment: Wheelchair ADL Screening (condition at time of admission) Patient's cognitive ability adequate to safely complete daily activities?: Yes Is the patient deaf or have difficulty hearing?: No Does the patient have difficulty seeing, even when wearing glasses/contacts?: No Does the patient have difficulty concentrating, remembering, or making decisions?: No Patient able to express need for assistance with ADLs?: Yes Does the patient have difficulty dressing or bathing?: No Independently performs ADLs?: Yes (appropriate for developmental age) Does the patient have difficulty walking or climbing stairs?: Yes Weakness of Legs: None Weakness of Arms/Hands: None  Permission Sought/Granted Permission sought to share information with : Case Manager,Facility Contact Representative,Family Supports Permission granted to share information with : Yes, Verbal Permission Granted     Permission granted to share info  w AGENCY: SNFs        Emotional Assessment Appearance:: Appears stated age Attitude/Demeanor/Rapport: Complaining Affect (typically observed): Agitated Orientation: : Oriented to Self,Oriented to Place,Oriented to  Time,Oriented to Situation Alcohol / Substance Use: Not Applicable Psych Involvement: No (comment)  Admission diagnosis:  Acute CHF (congestive heart failure) (HCC) [I50.9] Acute on chronic congestive heart failure, unspecified heart  failure type [facility]) [I50.9] Patient Active Problem List   Diagnosis Date Noted  . Acute CHF (congestive heart failure) ([place]) 08/13/2020  . Acute on chronic diastolic heart failure ([place]) 04/25/2020  . SOB (shortness of breath) 04/25/2020  . Elevated troponin 04/25/2020  . B12 deficiency   . Short-term memory loss   . Weakness   . Fungal skin infection   . Obesity (BMI 30-39.9)   . Cellulitis of right leg 01/04/2020  . COPD with acute exacerbation ([place]) 12/08/2019  . Physical deconditioning 12/08/2019  . Acute respiratory failure with hypoxia ([place])   . Iron deficiency anemia   . Type 2 diabetes mellitus without complication, with long-term current use of insulin ([place])   . CHF exacerbation ([place]) 11/20/2019  . Acute on chronic diastolic (congestive) heart failure ([place]) 11/19/2019  . Acute pulmonary edema (HCC)   . Acute on chronic diastolic CHF (congestive heart failure) ([place]) 10/22/2019  . Cellulitis of right foot 10/22/2019  . Non compliance with medical treatment   . Chronic respiratory failure with hypoxia ([place])   . Hypertension   . Chronic kidney disease, stage 3b ([place])   . Acute on chronic respiratory failure with hypoxia ([place])   . Diarrhea   . COPD (chronic obstructive pulmonary disease) ([place]) 10/03/2019  . Obesity, Class III, BMI 40-49.9 (morbid obesity) ([place]) 10/03/2019  . Chronic diastolic CHF (congestive heart failure) ([place]) 07/09/2019  . DM2 (diabetes mellitus, type 2) ([place]) 07/09/2019  . Peripheral edema 07/09/2019  . Gout 07/09/2019  . Obesity, diabetes, and hypertension syndrome ([place]) 07/09/2019  . Anemia of chronic disease 07/09/2019   PCP:  Alfredia Client, MD Pharmacy:   [facility] [place], [address] [place] [address] Phone: [phone] Fax: [phone]  [place], [place]  [address] Phone: [phone] Fax: [phone]     Social Determinants of Health (SDOH) Interventions    Readmission Risk Interventions Readmission Risk Prevention Plan 06/10/2020 10/24/2019  Transportation Screening Complete -  Medication Review ([place]) Complete Complete  PCP or Specialist appointment within 3-5 days of discharge Complete -  [place] or Home Care Consult Complete Complete  SW Recovery Care/Counseling Consult Complete -  Palliative Care Screening Not Applicable -  [place] Complete Patient Refused

## 2020-08-14 NOTE — Progress Notes (Signed)
Anticoagulation monitoring(Lovenox):  80 yo female ordered Lovenox 40 mg Q24h  Filed Weights   08/13/20 1749  Weight: 118.5 kg (261 lb 3.9 oz)   BMI 42.17    Lab Results  Component Value Date   CREATININE 1.47 (H) 08/13/2020   CREATININE 1.41 (H) 06/12/2020   CREATININE 1.48 (H) 06/11/2020   Estimated Creatinine Clearance: 40 mL/min (A) (by C-G formula based on SCr of 1.47 mg/dL (H)). Hemoglobin & Hematocrit     Component Value Date/Time   HGB 12.5 08/13/2020 1758   HCT 40.0 08/13/2020 1758   HCT 36.4 11/20/2019 1138     Per Protocol for Patient with estCrcl > 30 ml/min and BMI > 30, will transition to Lovenox 60 mg Q24h.

## 2020-08-14 NOTE — Progress Notes (Addendum)
PROGRESS NOTE    Beena Catano [facility]  KVQ:[id number] DOB: 1941-01-21 DOA: 08/13/2020 PCP: Alfredia Client, MD    Brief Narrative:  80 year old female admitted to the hospital with worsening shortness of breath.  Found to have decompensated CHF and admitted for intravenous diuresis.   Assessment & Plan:   Active Problems:   DM2 (diabetes mellitus, type 2) (HCC)   Gout   COPD (chronic obstructive pulmonary disease) (HCC)   Hypertension   Chronic kidney disease, stage 3b (HCC)   Acute on chronic diastolic (congestive) heart failure (HCC)   Acute CHF (congestive heart failure) (HCC)   Acute on chronic diastolic congestive heart failure -Currently on intravenous Lasix -Intake and output has not been accurate -Continues have some evidence of volume overload -Continue with diuresis for now -Recent echocardiogram from 05/2020 showed normal ejection fraction grade 1 diastolic dysfunction -Cardiac enzymes are negative  Hypertension -Continued on Lopressor and hydralazine  Hyperlipidemia -Continue statin  Gout -No evidence of flares at this time -Continue on colchicine as needed  Insulin-dependent diabetes -Continue on basal coverage with sliding scale insulin -Blood sugars have been stable  Chronic kidney disease stage IIIb -Creatinine is currently at baseline -Continue to monitor in the setting of diuresis  COPD -No evidence of wheezing at this time -Continue bronchodilators as needed   DVT prophylaxis: lovenox  Code Status: partial code, DNI Family Communication: no family present Disposition Plan: Status is: Inpatient  Remains inpatient appropriate because:IV treatments appropriate due to intensity of illness or inability to take PO and Inpatient level of care appropriate due to severity of illness   Dispo: The patient is from: SNF              Anticipated d/c is to: SNF              Patient currently is not medically stable to d/c.   Difficult  to place patient No         Consultants:     Procedures:     Antimicrobials:       Subjective: She feels tired and says she has not slept in the last 2 nights. Reports she is chronically on [place] and is asking if this can be continued. Feels that her breathing is mildly better since admission, but not back to baseline yet  Objective: Vitals:   08/14/20 0148 08/14/20 0409 08/14/20 0823 08/14/20 1127  BP: (!) 177/71 (!) 158/145 (!) 170/46 (!) 171/48  Pulse: 66 (!) 57 (!) 50 (!) 55  Resp: 20 16 18    Temp: 98.3 F (36.8 C) 97.7 F (36.5 C) 98.9 F (37.2 C) 97.8 F (36.6 C)  TempSrc: Oral  Oral   SpO2: 91% 99% 100% 100%  Weight: 111.7 kg     Height: 5\' 6"  (1.676 m)       Intake/Output Summary (Last 24 hours) at 08/14/2020 1713 Last data filed at 08/14/2020 1359 Gross per 24 hour  Intake 960 ml  Output 500 ml  Net 460 ml   Filed Weights   08/13/20 1749 08/14/20 0148  Weight: 118.5 kg 111.7 kg    Examination:  General exam: Appears calm and comfortable  Respiratory system: Crackles at bases. Respiratory effort normal. Cardiovascular system: S1 & S2 heard, RRR. No JVD, murmurs, rubs, gallops or clicks.  Gastrointestinal system: Abdomen is nondistended, soft and nontender. No organomegaly or masses felt. Normal bowel sounds heard. Central nervous system: Alert and oriented. No focal neurological deficits. Extremities: 1+ edema bilaterally Skin: No rashes,  lesions or ulcers Psychiatry: Judgement and insight appear normal. Mood & affect appropriate.     Data Reviewed: I have personally reviewed following labs and imaging studies  CBC: Recent Labs  Lab 08/13/20 1758 08/14/20 0503  WBC 7.3 7.4  HGB 12.5 12.2  HCT 40.0 39.5  MCV 85.7 86.6  PLT 257 793   Basic Metabolic Panel: Recent Labs  Lab 08/13/20 1758 08/14/20 0503  NA 140 140  K 4.5 4.3  CL 106 106  CO2 24 26  GLUCOSE 252* 254*  BUN 34* 33*  CREATININE 1.47* 1.38*  CALCIUM 8.3* 8.4*    GFR: Estimated Creatinine Clearance: 41.2 mL/min (A) (by C-G formula based on SCr of 1.38 mg/dL (H)). Liver Function Tests: No results for input(s): AST, ALT, ALKPHOS, BILITOT, PROT, ALBUMIN in the last 168 hours. No results for input(s): LIPASE, AMYLASE in the last 168 hours. No results for input(s): AMMONIA in the last 168 hours. Coagulation Profile: No results for input(s): INR, PROTIME in the last 168 hours. Cardiac Enzymes: No results for input(s): CKTOTAL, CKMB, CKMBINDEX, TROPONINI in the last 168 hours. BNP (last 3 results) No results for input(s): PROBNP in the last 8760 hours. HbA1C: No results for input(s): HGBA1C in the last 72 hours. CBG: Recent Labs  Lab 08/14/20 0136 08/14/20 1219 08/14/20 1614  GLUCAP 197* 208* 143*   Lipid Profile: No results for input(s): CHOL, HDL, LDLCALC, TRIG, CHOLHDL, LDLDIRECT in the last 72 hours. Thyroid Function Tests: No results for input(s): TSH, T4TOTAL, FREET4, T3FREE, THYROIDAB in the last 72 hours. Anemia Panel: No results for input(s): VITAMINB12, FOLATE, FERRITIN, TIBC, IRON, RETICCTPCT in the last 72 hours. Sepsis Labs: No results for input(s): PROCALCITON, LATICACIDVEN in the last 168 hours.  Recent Results (from the past 240 hour(s))  Resp Panel by RT-PCR (Flu A&B, Covid) Nasopharyngeal Swab     Status: None   Collection Time: 08/13/20 10:25 PM   Specimen: Nasopharyngeal Swab; Nasopharyngeal(NP) swabs in vial transport medium  Result Value Ref Range Status   SARS Coronavirus 2 by RT PCR NEGATIVE NEGATIVE Final    Comment: (NOTE) SARS-CoV-2 target nucleic acids are NOT DETECTED.  The SARS-CoV-2 RNA is generally detectable in upper respiratory specimens during the acute phase of infection. The lowest concentration of SARS-CoV-2 viral copies this assay can detect is 138 copies/mL. A negative result does not preclude SARS-Cov-2 infection and should not be used as the sole basis for treatment or other patient  management decisions. A negative result may occur with  improper specimen collection/handling, submission of specimen other than nasopharyngeal swab, presence of viral mutation(s) within the areas targeted by this assay, and inadequate number of viral copies(<138 copies/mL). A negative result must be combined with clinical observations, patient history, and epidemiological information. The expected result is Negative.  Fact Sheet for Patients:  EntrepreneurPulse.com.au  Fact Sheet for Healthcare Providers:  IncredibleEmployment.be  This test is no t yet approved or cleared by the [place] FDA and  has been authorized for detection and/or diagnosis of SARS-CoV-2 by FDA under an Emergency Use Authorization (EUA). This EUA will remain  in effect (meaning this test can be used) for the duration of the COVID-19 declaration under Section 564(b)(1) of the Act, 21 U.S.C.section 360bbb-3(b)(1), unless the authorization is terminated  or revoked sooner.       Influenza A by PCR NEGATIVE NEGATIVE Final   Influenza B by PCR NEGATIVE NEGATIVE Final    Comment: (NOTE) The Xpert Xpress SARS-CoV-2/FLU/RSV plus assay is intended  as an aid in the diagnosis of influenza from Nasopharyngeal swab specimens and should not be used as a sole basis for treatment. Nasal washings and aspirates are unacceptable for Xpert Xpress SARS-CoV-2/FLU/RSV testing.  Fact Sheet for Patients: EntrepreneurPulse.com.au  Fact Sheet for Healthcare Providers: IncredibleEmployment.be  This test is not yet approved or cleared by the [place] FDA and has been authorized for detection and/or diagnosis of SARS-CoV-2 by FDA under an Emergency Use Authorization (EUA). This EUA will remain in effect (meaning this test can be used) for the duration of the COVID-19 declaration under Section 564(b)(1) of the Act, 21 U.S.C. section 360bbb-3(b)(1),  unless the authorization is terminated or revoked.  Performed at [facility], [address] [place], [place] [address]          Radiology Studies: DG Chest 2 View  Result Date: 08/13/2020 CLINICAL DATA:  Shortness of breath for several years EXAM: CHEST - 2 VIEW COMPARISON:  Radiograph 06/09/2020 FINDINGS: No new focal consolidation. Hyperinflation, coarsened interstitial and bronchitic features are similar to comparison priors. Mild pulmonary vascular congestion is noted as well without convincing features frank pulmonary edema at this time. Stable cardiomediastinal contours with a calcified aorta. No pneumothorax or effusion. Severe degenerative changes in the bilateral shoulders. Multilevel degenerative changes the spine as well. IMPRESSION: Mild pulmonary vascular congestion without frank edema. Coarsened interstitial changes, bronchitic features and hyperinflation compatible with patient history of COPD. Aortic Atherosclerosis (ICD10-I70.0). Electronically Signed   By: Lovena Le M.D.   On: 08/13/2020 18:18        Scheduled Meds: . atorvastatin  20 mg Oral Daily  . enoxaparin (LOVENOX) injection  0.5 mg/kg Subcutaneous Q24H  . furosemide  40 mg Intravenous BID  . insulin aspart  0-15 Units Subcutaneous TID WC  . insulin aspart  0-5 Units Subcutaneous QHS  . insulin detemir  15 Units Subcutaneous Daily  . isosorbide mononitrate  30 mg Oral Daily  . metoprolol tartrate  25 mg Oral BID  . nystatin   Topical BID  . potassium chloride  10 mEq Oral Daily   Continuous Infusions:   LOS: 1 day    Time spent: 76mins    Kathie Dike, MD [facility]   If 7PM-7AM, please contact night-coverage [email or web address]  08/14/2020, 5:13 PM

## 2020-08-14 NOTE — NC FL2 (Signed)
[place] LEVEL OF CARE SCREENING TOOL     IDENTIFICATION  Patient Name: Karla Sanchez Birthdate: 18-Jan-1941 Sex: female Admission Date (Current Location): 08/13/2020  [facility] and [place] Number:  Engineering geologist and Address:  [facility], [address], [place], [place] [address]      Provider Number: [id number]  Attending Physician Name and Address:  Kathie Dike, MD  Relative Name and Phone Number:       Current Level of Care: Hospital Recommended Level of Care: [place] Prior Approval Number:    Date Approved/Denied:   PASRR Number: [id number]  Discharge Plan: SNF    Current Diagnoses: Patient Active Problem List   Diagnosis Date Noted  . Acute CHF (congestive heart failure) ([place]) 08/13/2020  . Acute on chronic diastolic heart failure ([place]) 04/25/2020  . SOB (shortness of breath) 04/25/2020  . Elevated troponin 04/25/2020  . B12 deficiency   . Short-term memory loss   . Weakness   . Fungal skin infection   . Obesity (BMI 30-39.9)   . Cellulitis of right leg 01/04/2020  . COPD with acute exacerbation ([place]) 12/08/2019  . Physical deconditioning 12/08/2019  . Acute respiratory failure with hypoxia ([place])   . Iron deficiency anemia   . Type 2 diabetes mellitus without complication, with long-term current use of insulin ([place])   . CHF exacerbation ([place]) 11/20/2019  . Acute on chronic diastolic (congestive) heart failure ([place]) 11/19/2019  . Acute pulmonary edema (HCC)   . Acute on chronic diastolic CHF (congestive heart failure) ([place]) 10/22/2019  . Cellulitis of right foot 10/22/2019  . Non compliance with medical treatment   . Chronic respiratory failure with hypoxia ([place])   . Hypertension   . Chronic kidney disease, stage 3b ([place])   . Acute on chronic respiratory failure with hypoxia ([place])   . Diarrhea   . COPD (chronic obstructive pulmonary disease) ([place]) 10/03/2019  . Obesity, Class  III, BMI 40-49.9 (morbid obesity) ([place]) 10/03/2019  . Chronic diastolic CHF (congestive heart failure) ([place]) 07/09/2019  . DM2 (diabetes mellitus, type 2) ([place]) 07/09/2019  . Peripheral edema 07/09/2019  . Gout 07/09/2019  . Obesity, diabetes, and hypertension syndrome ([place]) 07/09/2019  . Anemia of chronic disease 07/09/2019    Orientation RESPIRATION BLADDER Height & Weight     Self,Time,Situation,Place  O2 (nasal cannula) Incontinent Weight: 246 lb 4.1 oz (111.7 kg) Height:  5\' 6"  (167.6 cm)  BEHAVIORAL SYMPTOMS/MOOD NEUROLOGICAL BOWEL NUTRITION STATUS      Continent Diet (see discharge summary)  AMBULATORY STATUS COMMUNICATION OF NEEDS Skin   Limited Assist Verbally Other (Comment) (excoriated buttocks, sacrum and groin)                       Personal Care Assistance Level of Assistance  Bathing,Feeding,Dressing,Total care Bathing Assistance: Independent Feeding assistance: Independent Dressing Assistance: Limited assistance Total Care Assistance: Limited assistance   Functional Limitations Info  Sight,Hearing,Speech Sight Info: Adequate Hearing Info: Adequate Speech Info: Adequate    SPECIAL CARE FACTORS FREQUENCY  PT (By licensed PT),OT (By licensed OT)     PT Frequency: min 4x weekly OT Frequency: min 4x weekly            Contractures Contractures Info: Not present    Additional Factors Info  Code Status,Allergies Code Status Info: Partial Allergies Info: Codeine, exenatide, levofloxacin, losartan, tetracyclines           Current Medications (08/14/2020):  This is the  current hospital active medication list Current Facility-Administered Medications  Medication Dose Route Frequency Provider Last Rate Last Admin  . acetaminophen (TYLENOL) tablet 650 mg  650 mg Oral Q6H PRN Mansy, Jan A, MD       Or  . acetaminophen (TYLENOL) suppository 650 mg  650 mg Rectal Q6H PRN Mansy, Jan A, MD      . albuterol (PROVENTIL) (2.5 MG/3ML) 0.083% nebulizer  solution 2.5 mg  2.5 mg Nebulization Q2H PRN Naaman Plummer, MD      . atorvastatin (LIPITOR) tablet 20 mg  20 mg Oral Daily Mansy, Jan A, MD   20 mg at 08/14/20 1146  . colchicine tablet 0.6 mg  0.6 mg Oral Daily PRN Mansy, Jan A, MD      . docusate sodium (COLACE) capsule 100 mg  100 mg Oral Daily PRN Mansy, Jan A, MD      . enoxaparin (LOVENOX) injection 60 mg  0.5 mg/kg Subcutaneous Q24H Mansy, Jan A, MD      . furosemide (LASIX) injection 40 mg  40 mg Intravenous BID Mansy, Jan A, MD   40 mg at 08/14/20 1204  . insulin aspart (novoLOG) injection 0-15 Units  0-15 Units Subcutaneous TID WC Kathie Dike, MD   5 Units at 08/14/20 1233  . insulin aspart (novoLOG) injection 0-5 Units  0-5 Units Subcutaneous QHS Kathie Dike, MD      . insulin detemir (LEVEMIR) injection 15 Units  15 Units Subcutaneous Daily Mansy, Jan A, MD   15 Units at 08/14/20 1200  . isosorbide mononitrate (IMDUR) 24 hr tablet 30 mg  30 mg Oral Daily Mansy, Jan A, MD   30 mg at 08/14/20 1145  . magnesium hydroxide (MILK OF MAGNESIA) suspension 30 mL  30 mL Oral Daily PRN Mansy, Jan A, MD      . metoprolol tartrate (LOPRESSOR) tablet 25 mg  25 mg Oral BID Mansy, Jan A, MD   25 mg at 08/14/20 0231  . nystatin (MYCOSTATIN/NYSTOP) topical powder   Topical BID Mansy, Arvella Merles, MD   Given at 08/14/20 1203  . ondansetron (ZOFRAN) tablet 4 mg  4 mg Oral Q6H PRN Mansy, Jan A, MD       Or  . ondansetron [facility]) injection 4 mg  4 mg Intravenous Q6H PRN Mansy, Jan A, MD      . potassium chloride (KLOR-CON) CR tablet 10 mEq  10 mEq Oral Daily Mansy, Jan A, MD   10 mEq at 08/14/20 1146  . traZODone (DESYREL) tablet 50 mg  50 mg Oral QHS PRN Mansy, Jan A, MD      . zolpidem (AMBIEN) tablet 5 mg  5 mg Oral QHS PRN Mansy, Arvella Merles, MD         Discharge Medications: Please see discharge summary for a list of discharge medications.  Relevant Imaging Results:  Relevant Lab Results:   Additional Information JSE:[id number]  Alberteen Sam, LCSW

## 2020-08-14 NOTE — Consult Note (Signed)
[facility] Consultation Note  Patient ID: Karla Sanchez, MRN: [id number], DOB/AGE: April 03, 1940 80 y.o. Admit date: 08/13/2020   Date of Consult: 08/14/2020 Primary Physician: Alfredia Client, MD Primary Cardiologist: None  Chief Complaint:  Chief Complaint  Patient presents with  . Shortness of Breath   Reason for Consult: Heart failure  HPI: 80 y.o. female with known chronic diastolic dysfunction congestive heart failure diabetes hypertension hyperlipidemia chronic kidney disease stage III with glomerular filtration rate of 39 and decubitus ulcer who has had significant progression of shortness of breath lower extremity edema weakness and fatigue.  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  She does have a chest x-ray consistent with congestive heart failure and pulmonary edema.  She has received intravenous furosemide with some improvements of the symptoms and currently is improved overall.  The patient has had a decubitus ulcer for which she is because most of her significant concerns.  Troponin level is 13 consistent with demand ischemia rather than acute coronary syndrome.  She has had some reasonable urine output and no evidence of anginal symptoms right now  Past Medical History:  Diagnosis Date  . CHF (congestive heart failure) ([place])   . Colon cancer [facility])    s/p right and left colectomy 2020  . COPD (chronic obstructive pulmonary disease) ([place])   . Diabetes mellitus without complication ([place])   . Diastolic heart failure ([place])   . Gout   . Hypertension       Surgical History:  Past Surgical History:  Procedure Laterality Date  . COLON SURGERY    . TUBAL LIGATION  1970     Home Meds: Prior to Admission medications   Medication Sig Start Date End Date Taking? Authorizing Provider  acetaminophen (TYLENOL) 325 MG tablet  Take 650 mg by mouth every 6 (six) hours as needed for mild pain or fever.     [provider]  albuterol (VENTOLIN HFA) 108 (90 Base) MCG/ACT inhaler Inhale 2 puffs into the lungs every 6 (six) hours as needed for wheezing or shortness of breath. 01/09/20   Loletha Grayer, MD  aspirin EC 81 MG tablet Take 1 tablet (81 mg total) by mouth daily. Swallow whole. Patient not taking: No sig reported 01/09/20   Loletha Grayer, MD  atorvastatin (LIPITOR) 20 MG tablet Take 1 tablet (20 mg total) by mouth daily. Patient not taking: Reported on 08/14/2020 06/14/20   Fritzi Mandes, MD  colchicine 0.6 MG tablet Take 1 tablet (0.6 mg total) by mouth daily as needed (acute gout flare). 06/13/20   Fritzi Mandes, MD  docusate sodium (COLACE) 100 MG capsule Take 1 capsule (100 mg total) by mouth at bedtime. 01/09/20   Loletha Grayer, MD  ferrous sulfate 325 (65 FE) MG tablet Take 1 tablet (325 mg total) by mouth daily. Patient not taking: No sig reported 01/09/20   Loletha Grayer, MD  furosemide (LASIX) 40 MG tablet Take 1 tablet (40 mg total) by mouth 2 (two) times daily. 06/13/20   Fritzi Mandes, MD  Insulin Pen Needle 31G X 8 MM MISC 1 Dose by Does not apply route 3 (three) times daily before meals. 01/09/20   Loletha Grayer, MD  isosorbide mononitrate (IMDUR) 30 MG 24 hr tablet Take 1 tablet (30 mg total) by mouth daily. 06/14/20   Fritzi Mandes, MD  LEVEMIR 100 UNIT/ML injection Inject 0.15 mLs (  15 Units total) into the skin daily. 06/13/20   Fritzi Mandes, MD  liver oil-zinc oxide (DESITIN) 40 % ointment Apply 1 application topically in the morning and at bedtime. Uses on buttock 01/09/20   Loletha Grayer, MD  metoprolol tartrate (LOPRESSOR) 25 MG tablet Take 1 tablet (25 mg total) by mouth 2 (two) times daily. 06/13/20   Fritzi Mandes, MD  nystatin (MYCOSTATIN/NYSTOP) powder Apply topically 2 (two) times daily. 01/09/20   Loletha Grayer, MD  potassium chloride (KLOR-CON) 10 MEQ tablet Take 1 tablet (10  mEq total) by mouth daily. 06/13/20 06/13/21  Fritzi Mandes, MD  traZODone (DESYREL) 50 MG tablet Take 1 tablet (50 mg total) by mouth at bedtime as needed for sleep. 01/09/20   Loletha Grayer, MD    Inpatient Medications:  . atorvastatin  20 mg Oral Daily  . enoxaparin (LOVENOX) injection  0.5 mg/kg Subcutaneous Q24H  . furosemide  40 mg Intravenous BID  . hydrALAZINE  25 mg Oral Q8H  . insulin aspart  0-15 Units Subcutaneous TID WC  . insulin aspart  0-5 Units Subcutaneous QHS  . insulin detemir  15 Units Subcutaneous Daily  . isosorbide mononitrate  30 mg Oral Daily  . metoprolol tartrate  25 mg Oral BID  . nystatin   Topical BID  . potassium chloride  10 mEq Oral Daily     Allergies:  Allergies  Allergen Reactions  . Codeine Nausea Only, Other (See Comments) and Swelling    Other Reaction: nausea and vomiting Other reaction(s): NAUSEA   . Other Other (See Comments)    Other reaction(s): SWELLING  Other reaction(s): MUSCLE PAIN Muscle aches Other reaction(s): MUSCLE PAIN Muscle aches Other reaction(s): MUSCLE PAIN Muscle aches   . Exenatide Nausea And Vomiting  . Levofloxacin Other (See Comments)    Other Reaction: sloughing of buccal mucosa Other reaction(s): OTHER Lost skin in mouth Patient states she can take Cipro without difficulty and has taken it many times in the past Other Reaction: sloughing of buccal mucosa   . Losartan Other (See Comments)    Weight gain Weight gain   . Tetracyclines & Related Other (See Comments)    Social History   Socioeconomic History  . Marital status: Widowed    Spouse name: Not on file  . Number of children: Not on file  . Years of education: Not on file  . Highest education level: Not on file  Occupational History  . Not on file  Tobacco Use  . Smoking status: Never Smoker  . Smokeless tobacco: Never Used  Vaping Use  . Vaping Use: Never used  Substance and Sexual Activity  . Alcohol use: Not Currently  . Drug  use: Never  . Sexual activity: Not Currently  Other Topics Concern  . Not on file  Social History Narrative  . Not on file   Social Determinants of Health   Financial Resource Strain: Not on file  Food Insecurity: Not on file  Transportation Needs: Not on file  Physical Activity: Not on file  Stress: Not on file  Social Connections: Not on file  Intimate Partner Violence: Not on file     Family History  Problem Relation Age of Onset  . Lung cancer Father      Review of Systems Positive for shortness of breath Negative for: General:  chills, fever, night sweats or weight changes.  Cardiovascular: PND orthopnea syncope dizziness  Dermatological skin lesions rashes Respiratory: Cough congestion Urologic: Frequent urination urination at night and  hematuria Abdominal: negative for nausea, vomiting, diarrhea, bright red blood per rectum, melena, or hematemesis Neurologic: negative for visual changes, and/or hearing changes  All other systems reviewed and are otherwise negative except as noted above.  Labs: No results for input(s): CKTOTAL, CKMB, TROPONINI in the last 72 hours. Lab Results  Component Value Date   WBC 7.4 08/14/2020   HGB 12.2 08/14/2020   HCT 39.5 08/14/2020   MCV 86.6 08/14/2020   PLT 228 08/14/2020    Recent Labs  Lab 08/14/20 0503  NA 140  K 4.3  CL 106  CO2 26  BUN 33*  CREATININE 1.38*  CALCIUM 8.4*  GLUCOSE 254*   Lab Results  Component Value Date   CHOL 246 (H) 06/10/2020   HDL 53 06/10/2020   LDLCALC 151 (H) 06/10/2020   TRIG 208 (H) 06/10/2020   No results found for: DDIMER  Radiology/Studies:  DG Chest 2 View  Result Date: 08/13/2020 CLINICAL DATA:  Shortness of breath for several years EXAM: CHEST - 2 VIEW COMPARISON:  Radiograph 06/09/2020 FINDINGS: No new focal consolidation. Hyperinflation, coarsened interstitial and bronchitic features are similar to comparison priors. Mild pulmonary vascular congestion is noted as well  without convincing features frank pulmonary edema at this time. Stable cardiomediastinal contours with a calcified aorta. No pneumothorax or effusion. Severe degenerative changes in the bilateral shoulders. Multilevel degenerative changes the spine as well. IMPRESSION: Mild pulmonary vascular congestion without frank edema. Coarsened interstitial changes, bronchitic features and hyperinflation compatible with patient history of COPD. Aortic Atherosclerosis (ICD10-I70.0). Electronically Signed   By: Lovena Le M.D.   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 rate 18, height 5\' 6"  (1.676 m), weight 111.7 kg, SpO2 100 %. Body mass index is 39.75 kg/m. General: Well developed, well nourished, in no acute distress. Head eyes ears nose throat: Normocephalic, atraumatic, sclera non-icteric, no xanthomas, nares are without discharge. No apparent thyromegaly and/or mass  Lungs: Normal respiratory effort.  no wheezes, bibasilar rales, no rhonchi.  Heart: RRR with normal S1 S2. no murmur gallop, no rub, PMI is normal size and placement, carotid upstroke normal without bruit, jugular venous pressure is normal Abdomen: Soft, non-tender,  distended with normoactive bowel sounds. No hepatomegaly. No rebound/guarding. No obvious abdominal masses. Abdominal aorta is normal size without bruit Extremities: 1+ edema. no cyanosis, no clubbing, positive ulcers  Peripheral : 2+ bilateral upper extremity pulses, 2+ bilateral femoral pulses, 2+ bilateral dorsal pedal pulse Neuro: Alert and oriented. No facial asymmetry. No focal deficit. Moves all extremities spontaneously. Musculoskeletal: Normal muscle tone without kyphosis Psych:  Responds to questions  appropriately with a normal affect.    Assessment: 80 year old female with acute on chronic diastolic dysfunction congestive heart failure diabetes hypertension hyperlipidemia chronic kidney disease stage III with a glomerular filtration rate of 39 and no current evidence of angina or acute coronary syndrome having decubitus ulcer and shortness of breath improved with diuretics  Plan: 1.  Continue supportive care and concern for decubitus ulcer and treatment options 2.  Continue intravenous Lasix for acute diastolic dysfunction congestive heart failure with pulmonary edema 3.  Follow closely for worsening chronic kidney disease and concerns 4.  Continue medication management including metoprolol isosorbide hydralazine  combination for cardiomyopathy and congestive heart failure 5.  No further cardiac diagnostics necessary at this time due to no evidence of myocardial infarction 6.  Further treatment options after above  Signed, Corey Skains M.D. [facility] 08/14/2020, 6:08 PM

## 2020-08-14 NOTE — Progress Notes (Signed)
Mobility Specialist - Progress Note   08/14/20 1500  Mobility  Activity Refused mobility  Mobility performed by Mobility specialist    Pt politely declined mobility this date, no reason specified. Pt sleeping on arrival, reports she hasn't been able to sleep for past 2 days. States, "I just can't do it today, I'm sorry". Will attempt session another date/time.  Kathee Delton Mobility Specialist 08/14/20, 3:52 PM

## 2020-08-14 NOTE — Plan of Care (Signed)

## 2020-08-14 NOTE — Consult Note (Signed)
   Heart  Failure Nurse Navigator Note  HFpEF 55%.  Mild LVH.  Grade 1 diastolic dysfunction.  Normal right ventricular systolic function.  Mild left atrial enlargement.  Mild aortic stenosis.  Comorbidities:  Hypertension Type 2 diabetes Anxiety/depression Gout   Medications:  Atorvastatin 20 mg daily Furosemide 40 mg IV 2 times a day Isosorbide mononitrate 30 mg daily Metoprolol tartrate 25 mg 2 times a day   Labs: Sodium 140, potassium 4.3, chloride 106, CO2 26, BUN 33, creatinine 1.38 Intake 480 mL Output not documented Weight 111.7 kilogram  Assessment:  General-she is awake and alert lying in bed in no acute distress, pure wick is currently disconnected and patient is soaked in urine.  staff made aware.   HEENT-pupils are equal, normocephalic  Cardiac-heart tones of regular rate and rhythm.  Chest-  Abdomen-rounded soft obese nontender   Met with patient today.  She states that she is resides at a facility in [place], [place].  States that is where she gets her meals.  She states that she is recently been hospitalized and discharged from [place] with heart failure, within the last couple weeks.  She states that she had come to [facility] to visit when she felt she was in trouble and needed to come to the ED.   Pricilla Riffle RN [facility]

## 2020-08-15 DIAGNOSIS — E1122 Type 2 diabetes mellitus with diabetic chronic kidney disease: Secondary | ICD-10-CM | POA: Diagnosis not present

## 2020-08-15 DIAGNOSIS — I5033 Acute on chronic diastolic (congestive) heart failure: Secondary | ICD-10-CM | POA: Diagnosis not present

## 2020-08-15 DIAGNOSIS — N1832 Chronic kidney disease, stage 3b: Secondary | ICD-10-CM | POA: Diagnosis not present

## 2020-08-15 DIAGNOSIS — J449 Chronic obstructive pulmonary disease, unspecified: Secondary | ICD-10-CM | POA: Diagnosis not present

## 2020-08-15 LAB — BASIC METABOLIC PANEL
Anion gap: 10 (ref 5–15)
BUN: 34 mg/dL — ABNORMAL HIGH (ref 8–23)
CO2: 29 mmol/L (ref 22–32)
Calcium: 8.6 mg/dL — ABNORMAL LOW (ref 8.9–10.3)
Chloride: 102 mmol/L (ref 98–111)
Creatinine, Ser: 1.14 mg/dL — ABNORMAL HIGH (ref 0.44–1.00)
GFR, Estimated: 49 mL/min — ABNORMAL LOW (ref >60)
Glucose, Bld: 224 mg/dL — ABNORMAL HIGH (ref 70–99)
Potassium: 4.9 mmol/L (ref 3.5–5.1)
Sodium: 141 mmol/L (ref 135–145)

## 2020-08-15 LAB — GLUCOSE, CAPILLARY
Glucose-Capillary: 224 mg/dL — ABNORMAL HIGH (ref 70–99)
Glucose-Capillary: 190 mg/dL — ABNORMAL HIGH (ref 70–99)
Glucose-Capillary: 124 mg/dL — ABNORMAL HIGH (ref 70–99)
Glucose-Capillary: 218 mg/dL — ABNORMAL HIGH (ref 70–99)

## 2020-08-15 MED ORDER — FUROSEMIDE 40 MG PO TABS
80.0000 mg | ORAL_TABLET | Freq: Two times a day (BID) | ORAL | Status: DC
  Administered 2020-08-15 – 2020-08-16 (×2): 80 mg via ORAL
  Filled 2020-08-15: qty 2

## 2020-08-15 MED ORDER — DAPAGLIFLOZIN PROPANEDIOL 5 MG PO TABS
10.0000 mg | ORAL_TABLET | Freq: Every day | ORAL | Status: DC
  Administered 2020-08-15 – 2020-08-16 (×2): 10 mg via ORAL
  Filled 2020-08-15 (×2): qty 2

## 2020-08-15 NOTE — Progress Notes (Signed)
   08/15/20 1325  Clinical Encounter Type  Visited With Patient not available  Referral From Nurse  Consult/Referral To Chaplain;Other (Comment) (Order req for AD)  Chaplain Burris attempted to visit with Karla Sanchez but she was not available (patient sleeping). Will follow-up later on 5/26 or on 5/27 for pastoral are and to discuss any needs regarding advance directives.

## 2020-08-15 NOTE — Progress Notes (Addendum)
Patient refuses to have IV placed by IV team for IV lasix administration. RN explained to patient the necessity for IV lasix treatment for her volume overload, she says she "will not be stuck anymore, he can give me oral medications or nothing or write my discharge papers and I will leave."  MD notified.  Nurse assistant to bedside with RN to help change patient bedding and apply medicated ointment to bottom and groin as patient is saturated and lying in her own urine. Patient refusing any treatment or hygiene at this time, educated patient on importance of staying clean and dry in order to reduce further skin damage, she acknowledged with continued resistance. Will reassess at a later time and inform oncoming shift.

## 2020-08-15 NOTE — Progress Notes (Signed)
PROGRESS NOTE    Karla Sanchez [facility]  ZWC:[id number] DOB: 19-Oct-1940 DOA: 08/13/2020 PCP: Alfredia Client, MD    Brief Narrative:  80 year old female admitted to the hospital with worsening shortness of breath.  Found to have decompensated CHF and admitted for intravenous diuresis.   Assessment & Plan:   Active Problems:   DM2 (diabetes mellitus, type 2) (HCC)   Gout   COPD (chronic obstructive pulmonary disease) (HCC)   Hypertension   Chronic kidney disease, stage 3b (HCC)   Acute on chronic diastolic (congestive) heart failure (HCC)   Acute CHF (congestive heart failure) (HCC)   Acute on chronic diastolic congestive heart failure -Currently on intravenous Lasix -Intake and output has not been accurate -Continues have some evidence of volume overload -Continue with diuresis for now -Recent echocardiogram from 05/2020 showed normal ejection fraction grade 1 diastolic dysfunction -Cardiac enzymes are negative -creatinine improving with diuresis -appreciate cardiology input  Hypertension -Continued on Lopressor and hydralazine  Hyperlipidemia -Continue statin  Gout -No evidence of flares at this time -Continue on colchicine as needed  Insulin-dependent diabetes -Continue on basal coverage with sliding scale insulin -Blood sugars have been stable  Chronic kidney disease stage IIIb -Creatinine is currently at baseline, with some improvement with diuresis -Continue to monitor in the setting of diuresis  COPD -No evidence of wheezing at this time -Continue bronchodilators as needed   DVT prophylaxis: lovenox  Code Status: partial code, DNI Family Communication: no family present Disposition Plan: Status is: Inpatient  Remains inpatient appropriate because:IV treatments appropriate due to intensity of illness or inability to take PO and Inpatient level of care appropriate due to severity of illness   Dispo: The patient is from: SNF               Anticipated d/c is to: SNF              Patient currently is not medically stable to d/c.   Difficult to place patient No         Consultants:   cardiology  Procedures:     Antimicrobials:       Subjective: Feels that breathing is mildly better today. No chest pain. She has been having urinary accidents so intake/output is not accurate  Objective: Vitals:   08/14/20 1127 08/14/20 1939 08/15/20 0910 08/15/20 1113  BP: (!) 171/48 (!) 162/59 (!) 170/74 (!) 158/67  Pulse: (!) 55 (!) 53 60 (!) 53  Resp:  20 18 18   Temp: 97.8 F (36.6 C) 97.9 F (36.6 C) 97.7 F (36.5 C) 97.6 F (36.4 C)  TempSrc:    Oral  SpO2: 100% 96% 92% 100%  Weight:   109.7 kg   Height:        Intake/Output Summary (Last 24 hours) at 08/15/2020 1251 Last data filed at 08/15/2020 1047 Gross per 24 hour  Intake 720 ml  Output 900 ml  Net -180 ml   Filed Weights   08/13/20 1749 08/14/20 0148 08/15/20 0910  Weight: 118.5 kg 111.7 kg 109.7 kg    Examination:  General exam: Alert, awake, oriented x 3 Respiratory system: Crackles at bases. Respiratory effort normal. Cardiovascular system:RRR. No murmurs, rubs, gallops. Gastrointestinal system: Abdomen is nondistended, soft and nontender. No organomegaly or masses felt. Normal bowel sounds heard. Central nervous system: Alert and oriented. No focal neurological deficits. Extremities: 1+ edema bilaterally Skin: No rashes, lesions or ulcers Psychiatry: Judgement and insight appear normal. Mood & affect appropriate.  Data Reviewed: I have personally reviewed following labs and imaging studies  CBC: Recent Labs  Lab 08/13/20 1758 08/14/20 0503  WBC 7.3 7.4  HGB 12.5 12.2  HCT 40.0 39.5  MCV 85.7 86.6  PLT 257 846   Basic Metabolic Panel: Recent Labs  Lab 08/13/20 1758 08/14/20 0503 08/15/20 0945  NA 140 140 141  K 4.5 4.3 4.9  CL 106 106 102  CO2 24 26 29   GLUCOSE 252* 254* 224*  BUN 34* 33* 34*  CREATININE 1.47*  1.38* 1.14*  CALCIUM 8.3* 8.4* 8.6*   GFR: Estimated Creatinine Clearance: 49.4 mL/min (A) (by C-G formula based on SCr of 1.14 mg/dL (H)). Liver Function Tests: No results for input(s): AST, ALT, ALKPHOS, BILITOT, PROT, ALBUMIN in the last 168 hours. No results for input(s): LIPASE, AMYLASE in the last 168 hours. No results for input(s): AMMONIA in the last 168 hours. Coagulation Profile: No results for input(s): INR, PROTIME in the last 168 hours. Cardiac Enzymes: No results for input(s): CKTOTAL, CKMB, CKMBINDEX, TROPONINI in the last 168 hours. BNP (last 3 results) No results for input(s): PROBNP in the last 8760 hours. HbA1C: Recent Labs    08/14/20 1016  HGBA1C 8.5*   CBG: Recent Labs  Lab 08/14/20 1219 08/14/20 1614 08/14/20 2148 08/15/20 0844 08/15/20 1111  GLUCAP 208* 143* 182* 224* 190*   Lipid Profile: No results for input(s): CHOL, HDL, LDLCALC, TRIG, CHOLHDL, LDLDIRECT in the last 72 hours. Thyroid Function Tests: No results for input(s): TSH, T4TOTAL, FREET4, T3FREE, THYROIDAB in the last 72 hours. Anemia Panel: No results for input(s): VITAMINB12, FOLATE, FERRITIN, TIBC, IRON, RETICCTPCT in the last 72 hours. Sepsis Labs: No results for input(s): PROCALCITON, LATICACIDVEN in the last 168 hours.  Recent Results (from the past 240 hour(s))  Resp Panel by RT-PCR (Flu A&B, Covid) Nasopharyngeal Swab     Status: None   Collection Time: 08/13/20 10:25 PM   Specimen: Nasopharyngeal Swab; Nasopharyngeal(NP) swabs in vial transport medium  Result Value Ref Range Status   SARS Coronavirus 2 by RT PCR NEGATIVE NEGATIVE Final    Comment: (NOTE) SARS-CoV-2 target nucleic acids are NOT DETECTED.  The SARS-CoV-2 RNA is generally detectable in upper respiratory specimens during the acute phase of infection. The lowest concentration of SARS-CoV-2 viral copies this assay can detect is 138 copies/mL. A negative result does not preclude SARS-Cov-2 infection and should  not be used as the sole basis for treatment or other patient management decisions. A negative result may occur with  improper specimen collection/handling, submission of specimen other than nasopharyngeal swab, presence of viral mutation(s) within the areas targeted by this assay, and inadequate number of viral copies(<138 copies/mL). A negative result must be combined with clinical observations, patient history, and epidemiological information. The expected result is Negative.  Fact Sheet for Patients:  EntrepreneurPulse.com.au  Fact Sheet for Healthcare Providers:  IncredibleEmployment.be  This test is no t yet approved or cleared by the [place] FDA and  has been authorized for detection and/or diagnosis of SARS-CoV-2 by FDA under an Emergency Use Authorization (EUA). This EUA will remain  in effect (meaning this test can be used) for the duration of the COVID-19 declaration under Section 564(b)(1) of the Act, 21 U.S.C.section 360bbb-3(b)(1), unless the authorization is terminated  or revoked sooner.       Influenza A by PCR NEGATIVE NEGATIVE Final   Influenza B by PCR NEGATIVE NEGATIVE Final    Comment: (NOTE) The Xpert Xpress SARS-CoV-2/FLU/RSV plus assay is  intended as an aid in the diagnosis of influenza from Nasopharyngeal swab specimens and should not be used as a sole basis for treatment. Nasal washings and aspirates are unacceptable for Xpert Xpress SARS-CoV-2/FLU/RSV testing.  Fact Sheet for Patients: EntrepreneurPulse.com.au  Fact Sheet for Healthcare Providers: IncredibleEmployment.be  This test is not yet approved or cleared by the [place] FDA and has been authorized for detection and/or diagnosis of SARS-CoV-2 by FDA under an Emergency Use Authorization (EUA). This EUA will remain in effect (meaning this test can be used) for the duration of the COVID-19 declaration under  Section 564(b)(1) of the Act, 21 U.S.C. section 360bbb-3(b)(1), unless the authorization is terminated or revoked.  Performed at [facility], [address] [place], [place] [address]          Radiology Studies: DG Chest 2 View  Result Date: 08/13/2020 CLINICAL DATA:  Shortness of breath for several years EXAM: CHEST - 2 VIEW COMPARISON:  Radiograph 06/09/2020 FINDINGS: No new focal consolidation. Hyperinflation, coarsened interstitial and bronchitic features are similar to comparison priors. Mild pulmonary vascular congestion is noted as well without convincing features frank pulmonary edema at this time. Stable cardiomediastinal contours with a calcified aorta. No pneumothorax or effusion. Severe degenerative changes in the bilateral shoulders. Multilevel degenerative changes the spine as well. IMPRESSION: Mild pulmonary vascular congestion without frank edema. Coarsened interstitial changes, bronchitic features and hyperinflation compatible with patient history of COPD. Aortic Atherosclerosis (ICD10-I70.0). Electronically Signed   By: Lovena Le M.D.   On: 08/13/2020 18:18        Scheduled Meds: . atorvastatin  20 mg Oral Daily  . dapagliflozin propanediol  10 mg Oral Daily  . enoxaparin (LOVENOX) injection  0.5 mg/kg Subcutaneous Q24H  . furosemide  40 mg Intravenous BID  . hydrALAZINE  25 mg Oral Q8H  . insulin aspart  0-15 Units Subcutaneous TID WC  . insulin aspart  0-5 Units Subcutaneous QHS  . insulin detemir  15 Units Subcutaneous Daily  . isosorbide mononitrate  30 mg Oral Daily  . metoprolol tartrate  25 mg Oral BID  . nystatin   Topical BID  . potassium chloride  10 mEq Oral Daily   Continuous Infusions:   LOS: 2 days    Time spent: 80mins    Kathie Dike, MD [facility]   If 7PM-7AM, please contact night-coverage [email or web address]  08/15/2020, 12:51 PM

## 2020-08-15 NOTE — Progress Notes (Signed)
Was informed by staff that patient needed new IV  and was refusing to have IV team attempt to place an IV  Upon my arrival, patient appeared to be resting comfortably, she did not have any shortness of breath and was on room air.  Overall lower extremity edema has improved.  She is insistent that she does not want an IV and wishes to continue Lasix by mouth.  Lasix will be transition to oral dosing of 80 mg twice daily.  Will reassess labs in the morning and overall patient condition.  If she is stable, I suspect that she should be able to discharge tomorrow on oral diuretics since she is not requiring any further IV treatments.  [employer]

## 2020-08-15 NOTE — Progress Notes (Signed)
RN noted that patients bed was wet with urine while completing assessment. When RN attempted to change bed sheets for patient, patient became upset and told RN "my bed is fine I know that it is wet, I am tired I have not slept in two nights I do not need my bed changed it feels fine". RN attempted to educate the importance of skin integrity, keeping the skin clean and dry but patient continues to refuse care. Patient also made RN aware that she was refusing all further vital signs. Patient wishes to be left alone throughout the night and refuses all care and interventions until she wakes up in the morning. Call bell within reach. Bed alarm on, bed in lowest position.     Update: Patient called RN due to [facility] [place] being displaced. RN attempted to get patient to use [facility] but patient adamently refuses stating " I will not use the bedside commade, I need the long pee stick". RN educated patient on the importance of mobility and skin integrity. Patient reports that she is unable to ambulate Patient became argumentative with RN stating that the doctor ordered it for her. RN attempted to further educate the patient on current orders but patient began yelling demanding that RN give her purewick. Rn attempted to place purewick for patient but patient refused. She snatched purewick from RN hands stating " I will put it in myself". Patient then rolled over on her side and went back to sleep. Pateint continues to refuse vital signs. Call bell within reach. Bed in lowest position and bed alarm is set.

## 2020-08-15 NOTE — Progress Notes (Signed)
Pt allowed me to assess both upper extremities with tourniquet for V access than announced I can only use her Posterior areas - these areas are bruised and nothing visually seen to provide access. Showed patient where she has viable sites anteriorly and she refused IV access. Asked me to tell her nurse and the Dr. that she does not want any more IV's and that she would take 50 pills over having to have another IV. RN & DR Roderic Palau notified.

## 2020-08-15 NOTE — Progress Notes (Signed)
[facility] Cardiology [facility] Encounter Note  Patient: Karla Sanchez [facility] / Admit Date: 08/13/2020 / Date of Encounter: 08/15/2020, 7:25 AM   Subjective: Overall patient feels better and is breathing better since admission.  No evidence of anginal symptoms or myocardial infarction with troponin at 13.  Patient has had either poor urine output or not well documented.  Some few basilar crackles remain.  Telemetry shows normal sinus rhythm with bundle branch block.  Patient still has decubitus ulcer.  Review of Systems: Positive for: Shortness of breath Negative for: Vision change, hearing change, syncope, dizziness, nausea, vomiting,diarrhea, bloody stool, stomach pain, cough, congestion, diaphoresis, urinary frequency, urinary pain,skin lesions, skin rashes Others previously listed  Objective: Telemetry: Normal sinus rhythm Physical Exam: Blood pressure (!) 162/59, pulse (!) 53, temperature 97.9 F (36.6 C), resp. rate 20, height 5\' 6"  (1.676 m), weight 111.7 kg, SpO2 96 %. Body mass index is 39.75 kg/m. General: Well developed, well nourished, in no acute distress. Head: Normocephalic, atraumatic, sclera non-icteric, no xanthomas, nares are without discharge. Neck: No apparent masses Lungs: Normal respirations with no wheezes, no rhonchi, no rales , basilar crackles   Heart: Regular rate and rhythm, normal S1 S2, no murmur, no rub, no gallop, PMI is normal size and placement, carotid upstroke normal without bruit, jugular venous pressure normal Abdomen: Soft, non-tender,  distended with normoactive bowel sounds. No hepatosplenomegaly. Abdominal aorta is normal size without bruit Extremities: 1+ edema, no clubbing, no cyanosis, no ulcers,  Peripheral: 2+ radial, 2+ femoral, 2+ dorsal pedal pulses Neuro: Alert and oriented. Moves all extremities spontaneously. Psych:  Responds to questions appropriately with a normal affect.   Intake/Output Summary (Last 24 hours) at 08/15/2020  0725 Last data filed at 08/14/2020 1830 Gross per 24 hour  Intake 720 ml  Output 600 ml  Net 120 ml    Inpatient Medications:  . atorvastatin  20 mg Oral Daily  . enoxaparin (LOVENOX) injection  0.5 mg/kg Subcutaneous Q24H  . furosemide  40 mg Intravenous BID  . hydrALAZINE  25 mg Oral Q8H  . insulin aspart  0-15 Units Subcutaneous TID WC  . insulin aspart  0-5 Units Subcutaneous QHS  . insulin detemir  15 Units Subcutaneous Daily  . isosorbide mononitrate  30 mg Oral Daily  . metoprolol tartrate  25 mg Oral BID  . nystatin   Topical BID  . potassium chloride  10 mEq Oral Daily   Infusions:   Labs: Recent Labs    08/13/20 1758 08/14/20 0503  NA 140 140  K 4.5 4.3  CL 106 106  CO2 24 26  GLUCOSE 252* 254*  BUN 34* 33*  CREATININE 1.47* 1.38*  CALCIUM 8.3* 8.4*   No results for input(s): AST, ALT, ALKPHOS, BILITOT, PROT, ALBUMIN in the last 72 hours. Recent Labs    08/13/20 1758 08/14/20 0503  WBC 7.3 7.4  HGB 12.5 12.2  HCT 40.0 39.5  MCV 85.7 86.6  PLT 257 228   No results for input(s): CKTOTAL, CKMB, TROPONINI in the last 72 hours. Invalid input(s): POCBNP Recent Labs    08/14/20 1016  HGBA1C 8.5*     Weights: Filed Weights   08/13/20 1749 08/14/20 0148  Weight: 118.5 kg 111.7 kg     Radiology/Studies:  DG Chest 2 View  Result Date: 08/13/2020 CLINICAL DATA:  Shortness of breath for several years EXAM: CHEST - 2 VIEW COMPARISON:  Radiograph 06/09/2020 FINDINGS: No new focal consolidation. Hyperinflation, coarsened interstitial and bronchitic features are similar to  comparison priors. Mild pulmonary vascular congestion is noted as well without convincing features frank pulmonary edema at this time. Stable cardiomediastinal contours with a calcified aorta. No pneumothorax or effusion. Severe degenerative changes in the bilateral shoulders. Multilevel degenerative changes the spine as well. IMPRESSION: Mild pulmonary vascular congestion without frank  edema. Coarsened interstitial changes, bronchitic features and hyperinflation compatible with patient history of COPD. Aortic Atherosclerosis (ICD10-I70.0). Electronically Signed   By: Lovena Le M.D.   On: 08/13/2020 18:18     Assessment and Recommendation  80 y.o. female with known diabetes hypertension hyperlipidemia with acute on chronic diastolic dysfunction congestive heart failure with chronic kidney disease stage III with a glomerular filtration rate of 39 and decubitus ulcer slightly improved with appropriate medication management 1.  Continue intravenous Lasix watching closely for worsening chronic kidney disease for pulmonary edema and lower extremity edema 2.  No further cardiac diagnostics necessary at this time due to previous known normal LV systolic function and no current evidence of myocardial infarction 3.  Continuation of metoprolol for heart rate control and treatment of diastolic dysfunction congestive heart failure and will add dapagliflozin as well 4.  Begin ambulation and follow-up for improvements of symptoms 5.  Dr. Saralyn Pilar to be covering for weekend  Signed, Serafina Royals M.D. FACC

## 2020-08-16 DIAGNOSIS — J449 Chronic obstructive pulmonary disease, unspecified: Secondary | ICD-10-CM | POA: Diagnosis not present

## 2020-08-16 DIAGNOSIS — N1832 Chronic kidney disease, stage 3b: Secondary | ICD-10-CM | POA: Diagnosis not present

## 2020-08-16 DIAGNOSIS — I509 Heart failure, unspecified: Secondary | ICD-10-CM

## 2020-08-16 DIAGNOSIS — E1122 Type 2 diabetes mellitus with diabetic chronic kidney disease: Secondary | ICD-10-CM | POA: Diagnosis not present

## 2020-08-16 LAB — CBC
HCT: 40.7 % (ref 36.0–46.0)
Hemoglobin: 12.4 g/dL (ref 12.0–15.0)
MCH: 26.2 pg (ref 26.0–34.0)
MCHC: 30.5 g/dL (ref 30.0–36.0)
MCV: 86 fL (ref 80.0–100.0)
Platelets: 236 10*3/uL (ref 150–400)
RBC: 4.73 MIL/uL (ref 3.87–5.11)
RDW: 15.2 % (ref 11.5–15.5)
WBC: 6.5 10*3/uL (ref 4.0–10.5)
nRBC: 0 % (ref 0.0–0.2)

## 2020-08-16 LAB — BASIC METABOLIC PANEL WITH GFR
Anion gap: 10 (ref 5–15)
BUN: 33 mg/dL — ABNORMAL HIGH (ref 8–23)
CO2: 26 mmol/L (ref 22–32)
Calcium: 8.4 mg/dL — ABNORMAL LOW (ref 8.9–10.3)
Chloride: 103 mmol/L (ref 98–111)
Creatinine, Ser: 1.21 mg/dL — ABNORMAL HIGH (ref 0.44–1.00)
GFR, Estimated: 45 mL/min — ABNORMAL LOW
Glucose, Bld: 221 mg/dL — ABNORMAL HIGH (ref 70–99)
Potassium: 4.9 mmol/L (ref 3.5–5.1)
Sodium: 139 mmol/L (ref 135–145)

## 2020-08-16 LAB — GLUCOSE, CAPILLARY
Glucose-Capillary: 131 mg/dL — ABNORMAL HIGH (ref 70–99)
Glucose-Capillary: 150 mg/dL — ABNORMAL HIGH (ref 70–99)
Glucose-Capillary: 163 mg/dL — ABNORMAL HIGH (ref 70–99)

## 2020-08-16 MED ORDER — DAPAGLIFLOZIN PROPANEDIOL 10 MG PO TABS: 10.0000 mg | ORAL_TABLET | Freq: Every day | ORAL | 1 refills | Status: DC

## 2020-08-16 MED ORDER — FUROSEMIDE 40 MG PO TABS: ORAL_TABLET | ORAL | 2 refills | Status: DC

## 2020-08-16 MED ORDER — ATORVASTATIN CALCIUM 20 MG PO TABS: 20.0000 mg | ORAL_TABLET | Freq: Every day | ORAL | 0 refills | Status: DC

## 2020-08-16 MED ORDER — NYSTATIN 100000 UNIT/GM EX POWD
Freq: Three times a day (TID) | CUTANEOUS | Status: DC
  Filled 2020-08-16: qty 15

## 2020-08-16 MED ORDER — HYDRALAZINE HCL 25 MG PO TABS: 25.0000 mg | ORAL_TABLET | Freq: Three times a day (TID) | ORAL | 1 refills | Status: DC

## 2020-08-16 NOTE — Care Management Important Message (Signed)
Important Message  Patient Details  Name: Karla Sanchez MRN: [id number] Date of Birth: [birth year]   Medicare Important Message Given:  Yes     [name] Perine 08/16/2020, 1:05 PM

## 2020-08-16 NOTE — Discharge Summary (Signed)
Physician Discharge Summary  Promise Weldin [facility] YBO:[id number] DOB: 07-13-1940 DOA: 08/13/2020  PCP: Alfredia Client, MD  Admit date: 08/13/2020 Discharge date: 08/16/2020  Admitted From: SNF Disposition:  SNF  Recommendations for Outpatient Follow-up:  1. Follow up with PCP in 1-2 weeks 2. Please obtain BMP/CBC in one week 3. Follow up with Dr. Nehemiah Massed in 1-2 weeks  Discharge Condition: stable CODE STATUS:partial code, DNI Diet recommendation: heart healthy, carb modified  Brief/Interim Summary: 80 year old female admitted to the hospital with worsening shortness of breath.  Found to have decompensated CHF and admitted for intravenous diuresis.  Discharge Diagnoses:  Active Problems:   DM2 (diabetes mellitus, type 2) (HCC)   Gout   COPD (chronic obstructive pulmonary disease) (HCC)   Hypertension   Chronic kidney disease, stage 3b (HCC)   Acute on chronic diastolic (congestive) heart failure (HCC)   Acute CHF (congestive heart failure) (HCC)  Acute on chronic diastolic congestive heart failure -she was initially treated with intravenous Lasix -Intake and output has not been accurate -overall volume status has improved -she was transitioned to oral diuretics and maintained good urine output -Recent echocardiogram from 05/2020 showed normal ejection fraction grade 1 diastolic dysfunction -Cardiac enzymes are negative -creatinine currently stable -appreciate cardiology input -she is breathing comfortably on room air and can follow up with cardiology in 1-2 weeks  Hypertension -Continued on Lopressor and hydralazine  Hyperlipidemia -Continue statin  Gout -No evidence of flares at this time -Continue on colchicine as needed  Insulin-dependent diabetes -Continue on basal coverage with sliding scale insulin -Blood sugars have been stable -she was also started on farxiga  Chronic kidney disease stage IIIb -Creatinine is currently at baseline, with  some improvement with diuresis -Continue to monitor as an outpatient  COPD -No evidence of wheezing at this time -Continue bronchodilators as needed  Discharge Instructions  Discharge Instructions    Diet - low sodium heart healthy   Complete by: As directed    Increase activity slowly   Complete by: As directed      Allergies as of 08/16/2020      Reactions   Codeine Nausea Only, Other (See Comments), Swelling   Other Reaction: nausea and vomiting Other reaction(s): NAUSEA   Other Other (See Comments)   Other reaction(s): SWELLING Other reaction(s): MUSCLE PAIN Muscle aches Other reaction(s): MUSCLE PAIN Muscle aches Other reaction(s): MUSCLE PAIN Muscle aches   Exenatide Nausea And Vomiting   Levofloxacin Other (See Comments)   Other Reaction: sloughing of buccal mucosa Other reaction(s): OTHER Lost skin in mouth Patient states she can take Cipro without difficulty and has taken it many times in the past Other Reaction: sloughing of buccal mucosa   Losartan Other (See Comments)   Weight gain Weight gain   Tetracyclines & Related Other (See Comments)      Medication List    TAKE these medications   acetaminophen 325 MG tablet Commonly known as: TYLENOL Take 650 mg by mouth every 6 (six) hours as needed for mild pain or fever.   albuterol 108 (90 Base) MCG/ACT inhaler Commonly known as: VENTOLIN HFA Inhale 2 puffs into the lungs every 6 (six) hours as needed for wheezing or shortness of breath.   aspirin EC 81 MG tablet Take 1 tablet (81 mg total) by mouth daily. Swallow whole.   atorvastatin 20 MG tablet Commonly known as: LIPITOR Take 1 tablet (20 mg total) by mouth daily.   colchicine 0.6 MG tablet Take 1 tablet (0.6 mg total) by  mouth daily as needed (acute gout flare).   dapagliflozin propanediol 10 MG Tabs tablet Commonly known as: FARXIGA Take 1 tablet (10 mg total) by mouth daily. Start taking on: Aug 17, 2020   docusate sodium 100 MG  capsule Commonly known as: COLACE Take 1 capsule (100 mg total) by mouth at bedtime.   ferrous sulfate 325 (65 FE) MG tablet Take 1 tablet (325 mg total) by mouth daily.   furosemide 40 MG tablet Commonly known as: LASIX Take 80mg  po qam and 40mg  po qpm What changed:   how much to take  how to take this  when to take this  additional instructions   hydrALAZINE 25 MG tablet Commonly known as: APRESOLINE Take 1 tablet (25 mg total) by mouth every 8 (eight) hours.   Insulin Pen Needle 31G X 8 MM Misc 1 Dose by Does not apply route 3 (three) times daily before meals.   isosorbide mononitrate 30 MG 24 hr tablet Commonly known as: IMDUR Take 1 tablet (30 mg total) by mouth daily.   Levemir 100 UNIT/ML injection Generic drug: insulin detemir Inject 0.15 mLs (15 Units total) into the skin daily.   liver oil-zinc oxide 40 % ointment Commonly known as: DESITIN Apply 1 application topically in the morning and at bedtime. Uses on buttock   metoprolol tartrate 25 MG tablet Commonly known as: LOPRESSOR Take 1 tablet (25 mg total) by mouth 2 (two) times daily.   nystatin powder Commonly known as: MYCOSTATIN/NYSTOP Apply topically 2 (two) times daily.   pantoprazole 40 MG tablet Commonly known as: PROTONIX Take 40 mg by mouth daily.   potassium chloride 10 MEQ tablet Commonly known as: KLOR-CON Take 1 tablet (10 mEq total) by mouth daily.   traZODone 50 MG tablet Commonly known as: DESYREL Take 1 tablet (50 mg total) by mouth at bedtime as needed for sleep.       Allergies  Allergen Reactions  . Codeine Nausea Only, Other (See Comments) and Swelling    Other Reaction: nausea and vomiting Other reaction(s): NAUSEA   . Other Other (See Comments)    Other reaction(s): SWELLING  Other reaction(s): MUSCLE PAIN Muscle aches Other reaction(s): MUSCLE PAIN Muscle aches Other reaction(s): MUSCLE PAIN Muscle aches   . Exenatide Nausea And Vomiting  . Levofloxacin  Other (See Comments)    Other Reaction: sloughing of buccal mucosa Other reaction(s): OTHER Lost skin in mouth Patient states she can take Cipro without difficulty and has taken it many times in the past Other Reaction: sloughing of buccal mucosa   . Losartan Other (See Comments)    Weight gain Weight gain   . Tetracyclines & Related Other (See Comments)    Consultations:  cardiology   Procedures/Studies: DG Chest 2 View  Result Date: 08/13/2020 CLINICAL DATA:  Shortness of breath for several years EXAM: CHEST - 2 VIEW COMPARISON:  Radiograph 06/09/2020 FINDINGS: No new focal consolidation. Hyperinflation, coarsened interstitial and bronchitic features are similar to comparison priors. Mild pulmonary vascular congestion is noted as well without convincing features frank pulmonary edema at this time. Stable cardiomediastinal contours with a calcified aorta. No pneumothorax or effusion. Severe degenerative changes in the bilateral shoulders. Multilevel degenerative changes the spine as well. IMPRESSION: Mild pulmonary vascular congestion without frank edema. Coarsened interstitial changes, bronchitic features and hyperinflation compatible with patient history of COPD. Aortic Atherosclerosis (ICD10-I70.0). Electronically Signed   By: Lovena Le M.D.   On: 08/13/2020 18:18       Subjective: Feels  that breathing is better.   Discharge Exam: Vitals:   08/15/20 2030 08/16/20 0417 08/16/20 0700 08/16/20 1136  BP: (!) 169/76 (!) 162/65  (!) 136/57  Pulse: 67 (!) 56  (!) 48  Resp: 20 (!) 21  16  Temp: 98.2 F (36.8 C) 98.3 F (36.8 C)  97.9 F (36.6 C)  TempSrc: Oral Oral    SpO2: 90% 97%  94%  Weight:   109.5 kg   Height:        General: Pt is alert, awake, not in acute distress Cardiovascular: RRR, S1/S2 +, no rubs, no gallops Respiratory: CTA bilaterally, no wheezing, no rhonchi Abdominal: Soft, NT, ND, bowel sounds + Extremities: no edema, no cyanosis    The results  of significant diagnostics from this hospitalization (including imaging, microbiology, ancillary and laboratory) are listed below for reference.     Microbiology: Recent Results (from the past 240 hour(s))  Resp Panel by RT-PCR (Flu A&B, Covid) Nasopharyngeal Swab     Status: None   Collection Time: 08/13/20 10:25 PM   Specimen: Nasopharyngeal Swab; Nasopharyngeal(NP) swabs in vial transport medium  Result Value Ref Range Status   SARS Coronavirus 2 by RT PCR NEGATIVE NEGATIVE Final    Comment: (NOTE) SARS-CoV-2 target nucleic acids are NOT DETECTED.  The SARS-CoV-2 RNA is generally detectable in upper respiratory specimens during the acute phase of infection. The lowest concentration of SARS-CoV-2 viral copies this assay can detect is 138 copies/mL. A negative result does not preclude SARS-Cov-2 infection and should not be used as the sole basis for treatment or other patient management decisions. A negative result may occur with  improper specimen collection/handling, submission of specimen other than nasopharyngeal swab, presence of viral mutation(s) within the areas targeted by this assay, and inadequate number of viral copies(<138 copies/mL). A negative result must be combined with clinical observations, patient history, and epidemiological information. The expected result is Negative.  Fact Sheet for Patients:  EntrepreneurPulse.com.au  Fact Sheet for Healthcare Providers:  IncredibleEmployment.be  This test is no t yet approved or cleared by the [place] FDA and  has been authorized for detection and/or diagnosis of SARS-CoV-2 by FDA under an Emergency Use Authorization (EUA). This EUA will remain  in effect (meaning this test can be used) for the duration of the COVID-19 declaration under Section 564(b)(1) of the Act, 21 U.S.C.section 360bbb-3(b)(1), unless the authorization is terminated  or revoked sooner.       Influenza A  by PCR NEGATIVE NEGATIVE Final   Influenza B by PCR NEGATIVE NEGATIVE Final    Comment: (NOTE) The Xpert Xpress SARS-CoV-2/FLU/RSV plus assay is intended as an aid in the diagnosis of influenza from Nasopharyngeal swab specimens and should not be used as a sole basis for treatment. Nasal washings and aspirates are unacceptable for Xpert Xpress SARS-CoV-2/FLU/RSV testing.  Fact Sheet for Patients: EntrepreneurPulse.com.au  Fact Sheet for Healthcare Providers: IncredibleEmployment.be  This test is not yet approved or cleared by the [place] FDA and has been authorized for detection and/or diagnosis of SARS-CoV-2 by FDA under an Emergency Use Authorization (EUA). This EUA will remain in effect (meaning this test can be used) for the duration of the COVID-19 declaration under Section 564(b)(1) of the Act, 21 U.S.C. section 360bbb-3(b)(1), unless the authorization is terminated or revoked.  Performed at [facility], [address] [place], [place] [address]      Labs: BNP (last 3 results) Recent Labs    04/25/20 1552 06/09/20 1400 08/13/20  1758  BNP 169.6* 551.8* 062.3*   Basic Metabolic Panel: Recent Labs  Lab 08/13/20 1758 08/14/20 0503 08/15/20 0945 08/16/20 0904  NA 140 140 141 139  K 4.5 4.3 4.9 4.9  CL 106 106 102 103  CO2 24 26 29 26   GLUCOSE 252* 254* 224* 221*  BUN 34* 33* 34* 33*  CREATININE 1.47* 1.38* 1.14* 1.21*  CALCIUM 8.3* 8.4* 8.6* 8.4*   Liver Function Tests: No results for input(s): AST, ALT, ALKPHOS, BILITOT, PROT, ALBUMIN in the last 168 hours. No results for input(s): LIPASE, AMYLASE in the last 168 hours. No results for input(s): AMMONIA in the last 168 hours. CBC: Recent Labs  Lab 08/13/20 1758 08/14/20 0503 08/16/20 0904  WBC 7.3 7.4 6.5  HGB 12.5 12.2 12.4  HCT 40.0 39.5 40.7  MCV 85.7 86.6 86.0  PLT 257 228 236   Cardiac Enzymes: No results for input(s): CKTOTAL, CKMB,  CKMBINDEX, TROPONINI in the last 168 hours. BNP: Invalid input(s): POCBNP CBG: Recent Labs  Lab 08/15/20 1111 08/15/20 1626 08/15/20 2031 08/16/20 0754 08/16/20 1139  GLUCAP 190* 124* 218* 131* 150*   D-Dimer No results for input(s): DDIMER in the last 72 hours. Hgb A1c Recent Labs    08/14/20 1016  HGBA1C 8.5*   Lipid Profile No results for input(s): CHOL, HDL, LDLCALC, TRIG, CHOLHDL, LDLDIRECT in the last 72 hours. Thyroid function studies No results for input(s): TSH, T4TOTAL, T3FREE, THYROIDAB in the last 72 hours.  Invalid input(s): FREET3 Anemia work up No results for input(s): VITAMINB12, FOLATE, FERRITIN, TIBC, IRON, RETICCTPCT in the last 72 hours. Urinalysis    Component Value Date/Time   COLORURINE YELLOW (A) 08/13/2020 2338   APPEARANCEUR CLEAR (A) 08/13/2020 2338   LABSPEC 1.014 08/13/2020 2338   PHURINE 5.0 08/13/2020 2338   GLUCOSEU 150 (A) 08/13/2020 2338   HGBUR NEGATIVE 08/13/2020 2338   [place] 08/13/2020 2338   KETONESUR NEGATIVE 08/13/2020 2338   PROTEINUR >=300 (A) 08/13/2020 2338   NITRITE POSITIVE (A) 08/13/2020 2338   LEUKOCYTESUR TRACE (A) 08/13/2020 2338   Sepsis Labs Invalid input(s): PROCALCITONIN,  WBC,  LACTICIDVEN Microbiology Recent Results (from the past 240 hour(s))  Resp Panel by RT-PCR (Flu A&B, Covid) Nasopharyngeal Swab     Status: None   Collection Time: 08/13/20 10:25 PM   Specimen: Nasopharyngeal Swab; Nasopharyngeal(NP) swabs in vial transport medium  Result Value Ref Range Status   SARS Coronavirus 2 by RT PCR NEGATIVE NEGATIVE Final    Comment: (NOTE) SARS-CoV-2 target nucleic acids are NOT DETECTED.  The SARS-CoV-2 RNA is generally detectable in upper respiratory specimens during the acute phase of infection. The lowest concentration of SARS-CoV-2 viral copies this assay can detect is 138 copies/mL. A negative result does not preclude SARS-Cov-2 infection and should not be used as the sole basis for  treatment or other patient management decisions. A negative result may occur with  improper specimen collection/handling, submission of specimen other than nasopharyngeal swab, presence of viral mutation(s) within the areas targeted by this assay, and inadequate number of viral copies(<138 copies/mL). A negative result must be combined with clinical observations, patient history, and epidemiological information. The expected result is Negative.  Fact Sheet for Patients:  EntrepreneurPulse.com.au  Fact Sheet for Healthcare Providers:  IncredibleEmployment.be  This test is no t yet approved or cleared by the [place] FDA and  has been authorized for detection and/or diagnosis of SARS-CoV-2 by FDA under an Emergency Use Authorization (EUA). This EUA will remain  in  effect (meaning this test can be used) for the duration of the COVID-19 declaration under Section 564(b)(1) of the Act, 21 U.S.C.section 360bbb-3(b)(1), unless the authorization is terminated  or revoked sooner.       Influenza A by PCR NEGATIVE NEGATIVE Final   Influenza B by PCR NEGATIVE NEGATIVE Final    Comment: (NOTE) The Xpert Xpress SARS-CoV-2/FLU/RSV plus assay is intended as an aid in the diagnosis of influenza from Nasopharyngeal swab specimens and should not be used as a sole basis for treatment. Nasal washings and aspirates are unacceptable for Xpert Xpress SARS-CoV-2/FLU/RSV testing.  Fact Sheet for Patients: EntrepreneurPulse.com.au  Fact Sheet for Healthcare Providers: IncredibleEmployment.be  This test is not yet approved or cleared by the [place] FDA and has been authorized for detection and/or diagnosis of SARS-CoV-2 by FDA under an Emergency Use Authorization (EUA). This EUA will remain in effect (meaning this test can be used) for the duration of the COVID-19 declaration under Section 564(b)(1) of the Act, 21  U.S.C. section 360bbb-3(b)(1), unless the authorization is terminated or revoked.  Performed at [facility], [address], [place], [place] [address]      Time coordinating discharge: 59mins  SIGNED:   Kathie Dike, MD  [facility] 08/16/2020, 12:58 PM   If 7PM-7AM, please contact night-coverage [email or web address]

## 2020-08-16 NOTE — Progress Notes (Signed)
   08/16/20 0950  Clinical Encounter Type  Visited With Patient  Visit Type Initial;Spiritual support;Social support  Referral From Nurse  Consult/Referral To [place] responded to a spiritual consult request. PT declined AD. She stated she already had done one, years ago with a Chief Executive Officer. PT wanted to spend time with a Chaplain to further express her faith. PT spoke of heaven, and her desire to be with the [place]. She stated she did not want to be resuscitated because she knows where she is going. Chaplain ministered with presence, scripture, and reflective listening.

## 2020-08-16 NOTE — Progress Notes (Signed)
[facility]    SUBJECTIVE: The patient reports that her breathing is much improved, but not back to her baseline. She states she feels chest congestion without pain.   Vitals:   08/15/20 1629 08/15/20 2030 08/16/20 0417 08/16/20 0700  BP: (!) 151/72 (!) 169/76 (!) 162/65   Pulse: (!) 53 67 (!) 56   Resp: 18 20 (!) 21   Temp: 97.8 F (36.6 C) 98.2 F (36.8 C) 98.3 F (36.8 C)   TempSrc: Oral Oral Oral   SpO2: 99% 90% 97%   Weight:    109.5 kg  Height:         Intake/Output Summary (Last 24 hours) at 08/16/2020 5621 Last data filed at 08/15/2020 1830 Gross per 24 hour  Intake 960 ml  Output 2200 ml  Net -1240 ml      PHYSICAL EXAM  General: Well developed, well nourished, obese, sitting in chair eating breakfast, in no acute distress HEENT:  Normocephalic and atramatic Neck:  No JVD.  Lungs: normal effort of breathing on RA Heart: regular rate and rhythm Neuro: Alert and oriented X 3. Psych:  Good affect, responds appropriately   LABS: Basic Metabolic Panel: Recent Labs    08/14/20 0503 08/15/20 0945  NA 140 141  K 4.3 4.9  CL 106 102  CO2 26 29  GLUCOSE 254* 224*  BUN 33* 34*  CREATININE 1.38* 1.14*  CALCIUM 8.4* 8.6*   Liver Function Tests: No results for input(s): AST, ALT, ALKPHOS, BILITOT, PROT, ALBUMIN in the last 72 hours. No results for input(s): LIPASE, AMYLASE in the last 72 hours. CBC: Recent Labs    08/13/20 1758 08/14/20 0503  WBC 7.3 7.4  HGB 12.5 12.2  HCT 40.0 39.5  MCV 85.7 86.6  PLT 257 228   Cardiac Enzymes: No results for input(s): CKTOTAL, CKMB, CKMBINDEX, TROPONINI in the last 72 hours. BNP: Invalid input(s): POCBNP D-Dimer: No results for input(s): DDIMER in the last 72 hours. Hemoglobin A1C: Recent Labs    08/14/20 1016  HGBA1C 8.5*   Fasting Lipid Panel: No results for input(s): CHOL, HDL, LDLCALC, TRIG, CHOLHDL, LDLDIRECT in the last 72 hours. Thyroid Function Tests: No results for input(s): TSH, T4TOTAL,  T3FREE, THYROIDAB in the last 72 hours.  Invalid input(s): FREET3 Anemia Panel: No results for input(s): VITAMINB12, FOLATE, FERRITIN, TIBC, IRON, RETICCTPCT in the last 72 hours.  No results found.   Echo normal LVEF per echo 05/2020  TELEMETRY: sinus rhythm  ASSESSMENT AND PLAN:  Active Problems:   DM2 (diabetes mellitus, type 2) (HCC)   Gout   COPD (chronic obstructive pulmonary disease) (HCC)   Hypertension   Chronic kidney disease, stage 3b (HCC)   Acute on chronic diastolic (congestive) heart failure (HCC)   Acute CHF (congestive heart failure) ([place])    1. Acute on chronic diastolic CHF, patient reports that she is improving, but breathing not back to baseline, transitioned to oral Lasix. 2. CKD stage IIIb, stable 3. Hypertension, elevated this morning 4. COPD  Recommendations: 1. Continue PO Lasix 80 mg BID 2. Continue Farxiga, isosorbide mononitrate, metoprolol tartrate at lower dose of 12.5 mg BID, and potassium 3. Consider discharge later today or in morning pending nursing facility placement 4. Recommend follow up with Dr. Nehemiah Massed in 1-2 weeks to establish local cardiology care once discharged  Clabe Seal, PA-C 08/16/2020 9:09 AM

## 2020-08-16 NOTE — Progress Notes (Signed)
SATURATION QUALIFICATIONS: (This note is used to comply with regulatory documentation for home oxygen)  Patient Saturations on Room Air at Rest = 88%   Please briefly explain why patient needs home oxygen: She is on chronic 02 at 3liters at home. She cannot walk and refused to stand. On room air at rest she dropped to 88%

## 2020-08-16 NOTE — Consult Note (Addendum)
[facility] Nurse Consult Note: Reason for Consult: Consult requested for groin.  Pt is familiar to [facility] team from previous admission.  She has red moist patchy areas of partial thickness skin loss to inner gluteal folds/buttocks and groin; painful to touch. Appearance is consistent with moisture associated skin damage and red macular papular rash which is consistent with probable candidiasis. Dressing procedure/placement/frequency: Topical treatment orders provided for bedside nurses to perform to repel moisture and promote healing as follows:  Apply Desitin to buttocks/inner groin, then cover with antifungal powder TID and PRN when soiled.  Please re-consult if further assistance is needed.  Thank-you,  Julien Girt MSN, [place], [place], [place], [place]

## 2020-08-16 NOTE — Progress Notes (Signed)
Patient given hospital oxygen take for discharge home. Approved by case management.

## 2020-08-25 ENCOUNTER — Other Ambulatory Visit: Payer: Self-pay

## 2020-08-25 ENCOUNTER — Emergency Department
Admission: EM | Admit: 2020-08-25 | Discharge: 2020-08-29 | Disposition: A | Discharge status: Home / Self Care | Payer: Medicare Other | Attending: Emergency Medicine | Admitting: Emergency Medicine

## 2020-08-25 ENCOUNTER — Emergency Department: Payer: Medicare Other

## 2020-08-25 DIAGNOSIS — N1832 Chronic kidney disease, stage 3b: Secondary | ICD-10-CM | POA: Insufficient documentation

## 2020-08-25 DIAGNOSIS — Z79899 Other long term (current) drug therapy: Secondary | ICD-10-CM | POA: Diagnosis not present

## 2020-08-25 DIAGNOSIS — I509 Heart failure, unspecified: Secondary | ICD-10-CM

## 2020-08-25 DIAGNOSIS — Z85038 Personal history of other malignant neoplasm of large intestine: Secondary | ICD-10-CM | POA: Diagnosis not present

## 2020-08-25 DIAGNOSIS — Z7982 Long term (current) use of aspirin: Secondary | ICD-10-CM | POA: Insufficient documentation

## 2020-08-25 DIAGNOSIS — J441 Chronic obstructive pulmonary disease with (acute) exacerbation: Secondary | ICD-10-CM | POA: Diagnosis not present

## 2020-08-25 DIAGNOSIS — E119 Type 2 diabetes mellitus without complications: Secondary | ICD-10-CM | POA: Insufficient documentation

## 2020-08-25 DIAGNOSIS — I5033 Acute on chronic diastolic (congestive) heart failure: Secondary | ICD-10-CM | POA: Insufficient documentation

## 2020-08-25 DIAGNOSIS — E1122 Type 2 diabetes mellitus with diabetic chronic kidney disease: Secondary | ICD-10-CM | POA: Diagnosis not present

## 2020-08-25 DIAGNOSIS — Z20822 Contact with and (suspected) exposure to covid-19: Secondary | ICD-10-CM | POA: Diagnosis not present

## 2020-08-25 DIAGNOSIS — M109 Gout, unspecified: Secondary | ICD-10-CM | POA: Insufficient documentation

## 2020-08-25 DIAGNOSIS — R0602 Shortness of breath: Secondary | ICD-10-CM | POA: Diagnosis present

## 2020-08-25 DIAGNOSIS — D631 Anemia in chronic kidney disease: Secondary | ICD-10-CM | POA: Insufficient documentation

## 2020-08-25 DIAGNOSIS — I130 Hypertensive heart and chronic kidney disease with heart failure and stage 1 through stage 4 chronic kidney disease, or unspecified chronic kidney disease: Secondary | ICD-10-CM | POA: Insufficient documentation

## 2020-08-25 DIAGNOSIS — Z7984 Long term (current) use of oral hypoglycemic drugs: Secondary | ICD-10-CM | POA: Diagnosis not present

## 2020-08-25 LAB — CBC WITH DIFFERENTIAL/PLATELET
Abs Immature Granulocytes: 0.04 10*3/uL (ref 0.00–0.07)
Basophils Absolute: 0 10*3/uL (ref 0.0–0.1)
Basophils Relative: 0 %
Eosinophils Absolute: 0.1 10*3/uL (ref 0.0–0.5)
Eosinophils Relative: 1 %
HCT: 39 % (ref 36.0–46.0)
Hemoglobin: 12.1 g/dL (ref 12.0–15.0)
Immature Granulocytes: 0 %
Lymphocytes Relative: 17 %
Lymphs Abs: 1.9 10*3/uL (ref 0.7–4.0)
MCH: 26.4 pg (ref 26.0–34.0)
MCHC: 31 g/dL (ref 30.0–36.0)
MCV: 85.2 fL (ref 80.0–100.0)
Monocytes Absolute: 0.9 10*3/uL (ref 0.1–1.0)
Monocytes Relative: 8 %
Neutro Abs: 8 10*3/uL — ABNORMAL HIGH (ref 1.7–7.7)
Neutrophils Relative %: 74 %
Platelets: 215 10*3/uL (ref 150–400)
RBC: 4.58 MIL/uL (ref 3.87–5.11)
RDW: 15.7 % — ABNORMAL HIGH (ref 11.5–15.5)
WBC: 10.9 10*3/uL — ABNORMAL HIGH (ref 4.0–10.5)
nRBC: 0 % (ref 0.0–0.2)

## 2020-08-25 LAB — RESP PANEL BY RT-PCR (FLU A&B, COVID) ARPGX2
Influenza A by PCR: NEGATIVE
Influenza B by PCR: NEGATIVE
SARS Coronavirus 2 by RT PCR: NEGATIVE

## 2020-08-25 LAB — BASIC METABOLIC PANEL
Anion gap: 9 (ref 5–15)
BUN: 36 mg/dL — ABNORMAL HIGH (ref 8–23)
CO2: 25 mmol/L (ref 22–32)
Calcium: 8.3 mg/dL — ABNORMAL LOW (ref 8.9–10.3)
Chloride: 102 mmol/L (ref 98–111)
Creatinine, Ser: 1.18 mg/dL — ABNORMAL HIGH (ref 0.44–1.00)
GFR, Estimated: 47 mL/min — ABNORMAL LOW (ref >60)
Glucose, Bld: 298 mg/dL — ABNORMAL HIGH (ref 70–99)
Potassium: 5 mmol/L (ref 3.5–5.1)
Sodium: 136 mmol/L (ref 135–145)

## 2020-08-25 LAB — TROPONIN I (HIGH SENSITIVITY): Troponin I (High Sensitivity): 13 ng/L (ref <18)

## 2020-08-25 LAB — BRAIN NATRIURETIC PEPTIDE: B Natriuretic Peptide: 103.8 pg/mL — ABNORMAL HIGH (ref 0.0–100.0)

## 2020-08-25 MED ORDER — FUROSEMIDE 40 MG PO TABS
80.0000 mg | ORAL_TABLET | Freq: Once | ORAL | Status: AC
  Administered 2020-08-25: 80 mg via ORAL
  Filled 2020-08-25: qty 2

## 2020-08-25 MED ORDER — METOPROLOL TARTRATE 25 MG PO TABS
25.0000 mg | ORAL_TABLET | Freq: Two times a day (BID) | ORAL | Status: DC
  Administered 2020-08-25 – 2020-08-28 (×8): 25 mg via ORAL
  Filled 2020-08-25 (×7): qty 1

## 2020-08-25 MED ORDER — TRAZODONE HCL 50 MG PO TABS
50.0000 mg | ORAL_TABLET | Freq: Every day | ORAL | Status: DC
  Administered 2020-08-25 – 2020-08-27 (×3): 50 mg via ORAL
  Filled 2020-08-25 (×3): qty 1

## 2020-08-25 MED ORDER — COLCHICINE 0.6 MG PO TABS
0.6000 mg | ORAL_TABLET | Freq: Once | ORAL | Status: AC
  Administered 2020-08-25: 0.6 mg via ORAL
  Filled 2020-08-25: qty 1

## 2020-08-25 MED ORDER — OXYCODONE-ACETAMINOPHEN 5-325 MG PO TABS
1.0000 | ORAL_TABLET | Freq: Once | ORAL | Status: AC
  Administered 2020-08-25: 1 via ORAL
  Filled 2020-08-25: qty 1

## 2020-08-25 MED ORDER — COLCHICINE 0.6 MG PO TABS
0.6000 mg | ORAL_TABLET | Freq: Every day | ORAL | Status: DC
  Administered 2020-08-26 – 2020-08-28 (×3): 0.6 mg via ORAL
  Filled 2020-08-25 (×4): qty 1

## 2020-08-25 MED ORDER — HYDRALAZINE HCL 50 MG PO TABS
25.0000 mg | ORAL_TABLET | Freq: Three times a day (TID) | ORAL | Status: DC
  Administered 2020-08-25 – 2020-08-28 (×9): 25 mg via ORAL
  Filled 2020-08-25 (×10): qty 1

## 2020-08-25 MED ORDER — INSULIN DETEMIR 100 UNIT/ML ~~LOC~~ SOLN
15.0000 [IU] | Freq: Every day | SUBCUTANEOUS | Status: DC
  Administered 2020-08-25 – 2020-08-28 (×4): 15 [IU] via SUBCUTANEOUS
  Filled 2020-08-25 (×7): qty 0.15

## 2020-08-25 MED ORDER — ACETAMINOPHEN 500 MG PO TABS
1000.0000 mg | ORAL_TABLET | Freq: Three times a day (TID) | ORAL | Status: DC | PRN
  Administered 2020-08-25 – 2020-08-29 (×5): 1000 mg via ORAL
  Filled 2020-08-25 (×5): qty 2

## 2020-08-25 MED ORDER — ATORVASTATIN CALCIUM 20 MG PO TABS
20.0000 mg | ORAL_TABLET | Freq: Every day | ORAL | Status: DC
  Administered 2020-08-25 – 2020-08-28 (×4): 20 mg via ORAL
  Filled 2020-08-25 (×3): qty 1

## 2020-08-25 MED ORDER — PANTOPRAZOLE SODIUM 40 MG PO TBEC
40.0000 mg | DELAYED_RELEASE_TABLET | Freq: Every day | ORAL | Status: DC
  Administered 2020-08-25: 40 mg via ORAL
  Filled 2020-08-25 (×3): qty 1

## 2020-08-25 MED ORDER — FUROSEMIDE 40 MG PO TABS
40.0000 mg | ORAL_TABLET | Freq: Two times a day (BID) | ORAL | Status: DC
  Administered 2020-08-25 – 2020-08-26 (×3): 40 mg via ORAL
  Filled 2020-08-25 (×5): qty 1

## 2020-08-25 MED ORDER — ALBUTEROL SULFATE (2.5 MG/3ML) 0.083% IN NEBU
2.5000 mg | INHALATION_SOLUTION | Freq: Four times a day (QID) | RESPIRATORY_TRACT | Status: DC
  Administered 2020-08-25 (×2): 2.5 mg via RESPIRATORY_TRACT
  Filled 2020-08-25 (×3): qty 3

## 2020-08-25 MED ORDER — ISOSORBIDE MONONITRATE ER 60 MG PO TB24
30.0000 mg | ORAL_TABLET | Freq: Every day | ORAL | Status: DC
  Administered 2020-08-25 – 2020-08-28 (×4): 30 mg via ORAL
  Filled 2020-08-25 (×4): qty 1

## 2020-08-25 NOTE — Progress Notes (Signed)
PT Cancellation Note  Patient Details Name: Karla Sanchez MRN: [id number] DOB: June 28, 1940   Cancelled Treatment:    Reason Eval/Treat Not Completed: Other (comment) Orders received, chart reviewed. Pt does not have an MD note in the computer system yet. Will f/u as able & once POC has been established.   Lavone Nian, PT, DPT 08/25/20, 2:17 PM    [name] 08/25/2020, 2:16 PM

## 2020-08-25 NOTE — ED Provider Notes (Signed)
[facility] Emergency Department Provider Note ____________________________________________   Event Date/Time   First MD Initiated Contact with Patient 08/25/20 0913     (approximate)  I have reviewed the triage vital signs and the nursing notes.   HISTORY  Chief Complaint Shortness of Breath    HPI Karla Sanchez is a 80 y.o. female with PMH as noted below including CHF, COPD, diabetes, and gout who presents with wrist and leg pain over the last several days along with increased shortness of breath.  Both of the pain and shortness of breath are gradual in onset, persistent course.  She has not taken any medications to relieve them in the last several days.  The patient was recently admitted here for a CHF exacerbation.  She was subsequently discharged to an SNF in [place] and then admitted to [facility] a few days ago.  After discharge she returned to a hotel here in [place].  She states that she has been off of all of her medications since discharge on 6/1.  She states that the pain to the left wrist, left knee, and right great toe are all consistent with prior gout.  Previously she has been treated successfully with colchicine.  Past Medical History:  Diagnosis Date  . CHF (congestive heart failure) ([place])   . Colon cancer [facility])    s/p right and left colectomy 2020  . COPD (chronic obstructive pulmonary disease) ([place])   . Diabetes mellitus without complication ([place])   . Diastolic heart failure ([place])   . Gout   . Hypertension     Patient Active Problem List   Diagnosis Date Noted  . Acute CHF (congestive heart failure) ([place]) 08/13/2020  . Acute on chronic diastolic heart failure ([place]) 04/25/2020  . SOB (shortness of breath) 04/25/2020  . Elevated troponin 04/25/2020  . B12 deficiency   . Short-term memory loss   . Weakness   . Fungal skin infection   . Obesity (BMI 30-39.9)   . Cellulitis of right leg 01/04/2020  . COPD with  acute exacerbation ([place]) 12/08/2019  . Physical deconditioning 12/08/2019  . Acute respiratory failure with hypoxia ([place])   . Iron deficiency anemia   . Type 2 diabetes mellitus without complication, with long-term current use of insulin ([place])   . CHF exacerbation ([place]) 11/20/2019  . Acute on chronic diastolic (congestive) heart failure ([place]) 11/19/2019  . Acute pulmonary edema (HCC)   . Acute on chronic diastolic CHF (congestive heart failure) ([place]) 10/22/2019  . Cellulitis of right foot 10/22/2019  . Non compliance with medical treatment   . Chronic respiratory failure with hypoxia ([place])   . Hypertension   . Chronic kidney disease, stage 3b ([place])   . Acute on chronic respiratory failure with hypoxia ([place])   . Diarrhea   . COPD (chronic obstructive pulmonary disease) ([place]) 10/03/2019  . Obesity, Class III, BMI 40-49.9 (morbid obesity) ([place]) 10/03/2019  . Chronic diastolic CHF (congestive heart failure) ([place]) 07/09/2019  . DM2 (diabetes mellitus, type 2) ([place]) 07/09/2019  . Peripheral edema 07/09/2019  . Gout 07/09/2019  . Obesity, diabetes, and hypertension syndrome ([place]) 07/09/2019  . Anemia of chronic disease 07/09/2019    Past Surgical History:  Procedure Laterality Date  . COLON SURGERY    . TUBAL LIGATION  1970    Prior to Admission medications   Medication Sig Start Date End Date Taking? Authorizing Provider  acetaminophen (TYLENOL) 325 MG tablet Take 650 mg by mouth every 6 (six)  hours as needed for mild pain or fever.     [provider]  albuterol (VENTOLIN HFA) 108 (90 Base) MCG/ACT inhaler Inhale 2 puffs into the lungs every 6 (six) hours as needed for wheezing or shortness of breath. 01/09/20   Loletha Grayer, MD  aspirin EC 81 MG tablet Take 1 tablet (81 mg total) by mouth daily. Swallow whole. Patient not taking: No sig reported 01/09/20   Loletha Grayer, MD  atorvastatin (LIPITOR) 20 MG tablet Take 1 tablet (20 mg total) by mouth daily. 08/16/20    Kathie Dike, MD  colchicine 0.6 MG tablet Take 1 tablet (0.6 mg total) by mouth daily as needed (acute gout flare). 06/13/20   Fritzi Mandes, MD  dapagliflozin propanediol (FARXIGA) 10 MG TABS tablet Take 1 tablet (10 mg total) by mouth daily. 08/17/20   Kathie Dike, MD  docusate sodium (COLACE) 100 MG capsule Take 1 capsule (100 mg total) by mouth at bedtime. 01/09/20   Loletha Grayer, MD  ferrous sulfate 325 (65 FE) MG tablet Take 1 tablet (325 mg total) by mouth daily. Patient not taking: No sig reported 01/09/20   Loletha Grayer, MD  furosemide (LASIX) 40 MG tablet Take 80mg  po qam and 40mg  po qpm 08/16/20   Kathie Dike, MD  hydrALAZINE (APRESOLINE) 25 MG tablet Take 1 tablet (25 mg total) by mouth every 8 (eight) hours. 08/16/20   Kathie Dike, MD  Insulin Pen Needle 31G X 8 MM MISC 1 Dose by Does not apply route 3 (three) times daily before meals. 01/09/20   Loletha Grayer, MD  isosorbide mononitrate (IMDUR) 30 MG 24 hr tablet Take 1 tablet (30 mg total) by mouth daily. 06/14/20   Fritzi Mandes, MD  LEVEMIR 100 UNIT/ML injection Inject 0.15 mLs (15 Units total) into the skin daily. 06/13/20   Fritzi Mandes, MD  liver oil-zinc oxide (DESITIN) 40 % ointment Apply 1 application topically in the morning and at bedtime. Uses on buttock 01/09/20   Loletha Grayer, MD  metoprolol tartrate (LOPRESSOR) 25 MG tablet Take 1 tablet (25 mg total) by mouth 2 (two) times daily. 06/13/20   Fritzi Mandes, MD  nystatin (MYCOSTATIN/NYSTOP) powder Apply topically 2 (two) times daily. 01/09/20   Loletha Grayer, MD  pantoprazole (PROTONIX) 40 MG tablet Take 40 mg by mouth daily. 08/07/20   [provider]  potassium chloride (KLOR-CON) 10 MEQ tablet Take 1 tablet (10 mEq total) by mouth daily. 06/13/20 06/13/21  Fritzi Mandes, MD  traZODone (DESYREL) 50 MG tablet Take 1 tablet (50 mg total) by mouth at bedtime as needed for sleep. 01/09/20   Loletha Grayer, MD    Allergies Codeine, Other,  Exenatide, Levofloxacin, Losartan, and Tetracyclines & related  Family History  Problem Relation Age of Onset  . Lung cancer Father     Social History Social History   Tobacco Use  . Smoking status: Never Smoker  . Smokeless tobacco: Never Used  Vaping Use  . Vaping Use: Never used  Substance Use Topics  . Alcohol use: Not Currently  . Drug use: Never    Review of Systems  Constitutional: No fever/chills. Eyes: No visual changes. ENT: No sore throat. Cardiovascular: Denies chest pain. Respiratory: Positive for shortness of breath. Gastrointestinal: No vomiting or diarrhea.  Genitourinary: Negative for dysuria.  Musculoskeletal: Negative for back pain.  Positive for left wrist, left knee, and right great toe pain. Skin: Negative for rash. Neurological: Negative for headaches, focal weakness or numbness.   ____________________________________________   PHYSICAL  EXAM:  VITAL SIGNS: ED Triage Vitals  Enc Vitals Group     BP --      Pulse Rate 08/25/20 0909 89     Resp 08/25/20 0909 18     Temp 08/25/20 0912 98.5 F (36.9 C)     Temp Source 08/25/20 0912 Oral     SpO2 08/25/20 0909 95 %     Weight 08/25/20 0910 220 lb (99.8 kg)     Height 08/25/20 0910 5\' 6"  (1.676 m)     Head Circumference --      Peak Flow --      Pain Score 08/25/20 0910 10     Pain Loc --      Pain Edu? --      Excl. in [place]? --     Constitutional: Alert and oriented.  Somewhat chronically ill-appearing but in no acute distress. Eyes: Conjunctivae are normal.  Head: Atraumatic. Nose: No congestion/rhinnorhea. Mouth/Throat: Mucous membranes are moist.   Neck: Normal range of motion.  Cardiovascular: Normal rate, regular rhythm. Grossly normal heart sounds.  Good peripheral circulation. Respiratory: Normal respiratory effort.  No retractions. Lungs CTAB. Gastrointestinal: No distention.  Musculoskeletal: No lower extremity edema.  Extremities warm and well perfused.  Swelling and faint  erythema to the left lateral wrist and base of the right great toe.  No bony tenderness or deformity. Neurologic:  Normal speech and language. No gross focal neurologic deficits are appreciated.  Skin:  Skin is warm and dry. No rash noted. Psychiatric: Mood and affect are normal. Speech and behavior are normal.  ____________________________________________   LABS (all labs ordered are listed, but only abnormal results are displayed)  Labs Reviewed  BASIC METABOLIC PANEL - Abnormal; Notable for the following components:      Result Value   Glucose, Bld 298 (*)    BUN 36 (*)    Creatinine, Ser 1.18 (*)    Calcium 8.3 (*)    GFR, Estimated 47 (*)    All other components within normal limits  CBC WITH DIFFERENTIAL/PLATELET - Abnormal; Notable for the following components:   WBC 10.9 (*)    RDW 15.7 (*)    Neutro Abs 8.0 (*)    All other components within normal limits  BRAIN NATRIURETIC PEPTIDE - Abnormal; Notable for the following components:   B Natriuretic Peptide 103.8 (*)    All other components within normal limits  RESP PANEL BY RT-PCR (FLU A&B, COVID) ARPGX2  TROPONIN I (HIGH SENSITIVITY)   ____________________________________________  EKG  ED ECG REPORT I, Arta Silence, the attending physician, personally viewed and interpreted this ECG.  Date: 08/25/2020 EKG Time: 09 20 Rate: 77 Rhythm: normal sinus rhythm QRS Axis: normal Intervals: RBBB, LAFB ST/T Wave abnormalities: Nonspecific abnormalities Narrative Interpretation: no evidence of acute ischemia; no significant change when compared to EKG of 08/13/2020  ____________________________________________  RADIOLOGY  Chest x-ray interpreted by me shows no focal consolidation or edema ____________________________________________   PROCEDURES  Procedure(s) performed: No  Procedures  Critical Care performed: No ____________________________________________   INITIAL IMPRESSION / ASSESSMENT AND PLAN /  ED COURSE  Pertinent labs & imaging results that were available during my care of the patient were reviewed by me and considered in my medical decision making (see chart for details).  80 year old female with PMH as noted above including CHF, COPD, and gout presents with pain to the left wrist, left knee, and right great toe as well as increased shortness of breath over the last several  days.  She attributes the joint pain to a gout flare.  She has been off of all of her medications for the last few days.  I reviewed the past medical records in [place].  Additionally, I am familiar with this patient from prior ED visits.  A summary of her recent history is as follows: I saw the patient here in the ED on 5/24 with worsening shortness of breath consistent with a CHF exacerbation.  The patient had been staying in a hotel, and had previously declined SNF placement.  She stated at that time that she had been living in [place] for many years but had returned to [place], where she is from, and finally wanted to be placed in a facility.  She was admitted at that time for CHF exacerbation.  She was discharged to [facility] in [place] on 5/27.  She was seen in the ED at [facility] on 5/28 for a rash to her groin which was diagnosed as Candida and she was prescribed fluconazole for this and Bactrim for UTI.  Subsequently she was admitted for 3 days at [facility] for chest pain, hypertension, and AKI.  Per the admission notes, she apparently had bed offers at several nursing facilities but declined them because of location and wanted to return to [place] where she has family that could potentially assist her.  She was discharged on 6/2 and states that she has not had any of her medications since that time.  On exam currently, the patient is overall relatively well-appearing.  Her vital signs are normal.  O2 saturation is in the mid 90s on room air.  She has some erythema to the  painful joints consistent with gout.  Lungs are clear to auscultation.  There is no significant peripheral edema.  1.  Joint pain: This is consistent with gout, which the patient has a long history of.  The patient has had no recent trauma.  There is no indication for imaging.  There is no evidence of cellulitis or septic arthritis.  We will treat with colchicine and Percocet.  2.  Shortness of breath: This is consistent with chronic CHF.  The patient currently is not hypoxic and has no significant peripheral edema.  I doubt acute CHF exacerbation.  Will obtain a chest x-ray and labs.  3.  Disposition: The patient has now multiple times declined SNF placement and locations that she deems unfavorable to her and has returned to live in hotels locally.  I am concerned that she is unable to adequately take care of herself because of her numerous recent ED visits and admissions and inability to get her medications.  If there are no criteria for admission, I will consult social work to assist with placement and/or other appropriate disposition.  ----------------------------------------- 11:34 AM on 08/25/2020 -----------------------------------------  The patient reports that her gout pain has improved.  Work-up is overall reassuring.  Troponin is negative.  BNP is not significant elevated, there is no leukocytosis, and the creatinine is at baseline.  There is no evidence of edema on the chest x-ray, or any clinical evidence for acute CHF exacerbation.  At this time, the patient does not meet any criteria for inpatient admission.  I have placed a consult for PT evaluation and social work for SNF placement.  I have ordered the patient's home medications.  ____________________________________________   FINAL CLINICAL IMPRESSION(S) / ED DIAGNOSES  Final diagnoses:  Acute gout of multiple sites, unspecified cause  Chronic  congestive heart failure, unspecified heart failure type ([place])      NEW  MEDICATIONS STARTED DURING THIS VISIT:  New Prescriptions   No medications on file     Note:  This document was prepared using Dragon voice recognition software and may include unintentional dictation errors.    Arta Silence, MD 08/25/20 1527

## 2020-08-25 NOTE — ED Notes (Signed)
Attempted to get IV and blood work from pt- pt states she only wants her right lower arm used- was unsuccessful and pt would not allow RN to try again- will call lab to attempt to get blood work

## 2020-08-25 NOTE — ED Triage Notes (Signed)
[facility]- upon arrival pt in complaining of gout pain in her joints- pt is 95% on RA

## 2020-08-26 DIAGNOSIS — I130 Hypertensive heart and chronic kidney disease with heart failure and stage 1 through stage 4 chronic kidney disease, or unspecified chronic kidney disease: Secondary | ICD-10-CM | POA: Diagnosis not present

## 2020-08-26 MED ORDER — POLYETHYLENE GLYCOL 3350 17 G PO PACK
17.0000 g | PACK | Freq: Every day | ORAL | Status: DC | PRN
  Filled 2020-08-26: qty 1

## 2020-08-26 MED ORDER — COLCHICINE 0.6 MG PO TABS
0.6000 mg | ORAL_TABLET | Freq: Once | ORAL | Status: AC
  Administered 2020-08-26: 0.6 mg via ORAL
  Filled 2020-08-26: qty 1

## 2020-08-26 NOTE — Progress Notes (Signed)
   08/26/20 0720  Clinical Encounter Type  Visited With Patient  Visit Type Spiritual support;Social support;ED  Referral From Chaplain  Consult/Referral To [place] checked-in on patient who called for assistance. [place] provided hospitality (blanket, water) and offered compassionate presence. Chaplain Burris provided reflective listening and support for patient coping with pain and anticipating change of life circumstance. Daryel November also offered prayer with PT and visit was welcomed.

## 2020-08-26 NOTE — ED Notes (Signed)
Patient remains sleeping at this time. Respirations even and unlabored at this time.

## 2020-08-26 NOTE — ED Provider Notes (Signed)
Emergency Medicine Observation Re-evaluation Note  Karla Sanchez is a 80 y.o. female, seen on rounds today.  Pt initially presented to the ED for complaints of Shortness of Breath Currently, the patient is resting in recliner watching tv, denies any acute concerns.  Physical Exam  BP (!) 107/51 (BP Location: Right Wrist)   Pulse 71   Temp 98.6 F (37 C) (Oral)   Resp 16   Ht 5\' 6"  (1.676 m)   Wt 99.8 kg   SpO2 94%   BMI 35.51 kg/m  Physical Exam General: no acute distress Cardiac: normal rate Lungs: equal chest rise Psych: calm  ED Course / MDM  EKG:   I have reviewed the labs performed to date as well as medications administered while in observation.  Recent changes in the last 24 hours include none.  Plan  Current plan is for social work to [place]. Patient is not under full IVC at this time.   Lucrezia Starch, MD 08/26/20 [phone]

## 2020-08-26 NOTE — TOC Initial Note (Signed)
Transition of Care [facility]) - Progression Note    Patient Details  Name: Karla Sanchez MRN: [id number] Date of Birth: January 20, 1941  Transition of Care [facility]) CM/SW [place], [place] Phone Number:  [phone] 08/26/2020, 4:31 PM  Clinical Narrative:     Patient returns to [facility] ED due to SOB after recent discharge.  Patient had bed offer for SNF but chose to return to the [facility] instead.  Patient does not meet criteria for long term care placement due to monthly income exceeding Medicaid income cut off.  Patient has systematically refused SNF placement.  CSW attempted to contact patient ut neither contact number in chart was working.  CSW requested ED RN ask patient to contact this CSW, but patient never called.        Expected Discharge Plan and Services                                                 Social Determinants of Health (SDOH) Interventions    Readmission Risk Interventions Readmission Risk Prevention Plan 06/10/2020 10/24/2019  Transportation Screening Complete -  Medication Review Press photographer) Complete Complete  PCP or Specialist appointment within 3-5 days of discharge Complete -  [place] or Home Care Consult Complete Complete  SW Recovery Care/Counseling Consult Complete -  Palliative Care Screening Not Applicable -  [place] Complete Patient Refused

## 2020-08-26 NOTE — ED Notes (Signed)
Spoke with Dr Kerman Passey re: patient's c/o gout flare and constipation. Orders received.

## 2020-08-26 NOTE — ED Notes (Signed)
Pt given warm blanket at this time. Pt denies any further needs. Patient states she does not feel she needs her albuterol inhaler at this time.

## 2020-08-26 NOTE — Evaluation (Signed)
Physical Therapy Evaluation Patient Details Name: Karla Sanchez MRN: [id number] DOB: 1940-10-27 Today's Date: 08/26/2020   History of Present Illness  Pt is an 80 y/o F who presents with wrist & leg pain & increased SOB. [name] joint pain is consistent with gout, which she has a long hx of. PMH: CHF, COPD, DM, gout, colon CA, diastolic heart failure, HTN  Clinical Impression  Pt seen for PT evaluation with pt requesting to reposition in recliner 2/2 posterior thigh/buttocks feeling "raw" on R side. Pt requires 2 attempts and mod assist for successful sit>stand with 1UE HHA. Pt tolerates standing ~15 seconds but reports inability to attempt marching in place. After returning to sitting, pt declines further attempts at standing nor BLE exercises 2/2 pain & requesting a snack, noting she hasn't had anything since yesterday's breakfast. PT provided pt with water & crackers & notified nurse of pt's c/o pain. Discussed rehab recommendations with pt & pt is aware of need for improved strength & independence with mobility prior to return home. Will continue to follow pt acutely to progress activity tolerance, strength, endurance, transfers & bed mobility. Pt would benefit from STR upon d/c to maximize independence with functional mobility & reduce fall risk prior to return home.     Follow Up Recommendations SNF    Equipment Recommendations  None recommended by PT    Recommendations for Other Services       Precautions / Restrictions Precautions Precautions: Fall Restrictions Weight Bearing Restrictions: No      Mobility  Bed Mobility               General bed mobility comments: not observed as pt received & left sitting in recliner    Transfers Overall transfer level: Needs assistance Equipment used: 1 person hand held assist Transfers: Sit to/from Stand Sit to Stand: Mod assist         General transfer comment: 2 attempts for success, PT blocking R knee for increased  stability  Ambulation/Gait                Stairs            Wheelchair Mobility    Modified Rankin (Stroke Patients Only)       Balance Overall balance assessment: Needs assistance Sitting-balance support: Feet supported;No upper extremity supported Sitting balance-Leahy Scale: Fair     Standing balance support: Single extremity supported Standing balance-Leahy Scale: Poor                               Pertinent Vitals/Pain Pain Assessment: 0-10 Pain Score: 10-Worst pain ever Pain Location: B wrists (L>R), BLE, buttocks/posterior thigh Pain Descriptors / Indicators: Discomfort (c/o buttocks/posterior thigh of feeling "raw") Pain Intervention(s): Monitored during session;Limited activity within patient's tolerance;Patient requesting pain meds-RN notified    Home Living Family/patient expects to be discharged to:: Skilled nursing facility                 Additional Comments: Pt living at [facility] in a handicap room    Prior Function Level of Independence: Independent with assistive device(s)         Comments: Pt uses w/c for mobility, only performs stand pivot transfers mod I bed<>w/c. Pt reports she hasn't walked due to gout in BLE.     Hand Dominance   Dominant Hand: Right    Extremity/Trunk Assessment   Upper Extremity Assessment Upper Extremity Assessment: Generalized weakness (  limited by pain in B wrists (L wrist appears slightly swollen))    Lower Extremity Assessment Lower Extremity Assessment: Generalized weakness    Cervical / Trunk Assessment Cervical / Trunk Assessment: Kyphotic  Communication   Communication: No difficulties  Cognition Arousal/Alertness: Awake/alert Behavior During Therapy: WFL for tasks assessed/performed Overall Cognitive Status: Within Functional Limits for tasks assessed                                        General Comments      Exercises     Assessment/Plan     PT Assessment Patient needs continued PT services  PT Problem List Decreased strength;Decreased mobility;Decreased range of motion;Decreased activity tolerance;Obesity;Decreased balance;Pain;Decreased knowledge of use of DME       PT Treatment Interventions DME instruction;Wheelchair mobility training;Therapeutic exercise;Balance training;Neuromuscular re-education;Functional mobility training;Therapeutic activities;Patient/family education    PT Goals (Current goals can be found in the Care Plan section)  Acute Rehab PT Goals Patient Stated Goal: decreased pain PT Goal Formulation: With patient Time For Goal Achievement: 09/09/20 Potential to Achieve Goals: Fair    Frequency Min 2X/week   Barriers to discharge Decreased caregiver support      Co-evaluation               AM-PAC PT "6 Clicks" Mobility  Outcome Measure Help needed turning from your back to your side while in a flat bed without using bedrails?: A Little Help needed moving from lying on your back to sitting on the side of a flat bed without using bedrails?: A Lot Help needed moving to and from a bed to a chair (including a wheelchair)?: Total Help needed standing up from a chair using your arms (e.g., wheelchair or bedside chair)?: A Lot Help needed to walk in hospital room?: Total Help needed climbing 3-5 steps with a railing? : Total 6 Click Score: 10    End of Session Equipment Utilized During Treatment: Gait belt Activity Tolerance: Patient limited by pain Patient left: in chair;with call bell/phone within reach Nurse Communication: Patient requests pain meds PT Visit Diagnosis: Muscle weakness (generalized) (M62.81);Difficulty in walking, not elsewhere classified (R26.2);Unsteadiness on feet (R26.81);Pain Pain - Right/Left:  (bilateral) Pain - part of body: Ankle and joints of foot (wrists)    Time: 0902-0910 PT Time Calculation (min) (ACUTE ONLY): 8 min   Charges:   PT Evaluation $PT Eval Low  Complexity: Wann, PT, DPT 08/26/20, 9:25 AM   [name] 08/26/2020, 9:23 AM

## 2020-08-27 DIAGNOSIS — I130 Hypertensive heart and chronic kidney disease with heart failure and stage 1 through stage 4 chronic kidney disease, or unspecified chronic kidney disease: Secondary | ICD-10-CM | POA: Diagnosis not present

## 2020-08-27 MED ORDER — COLCHICINE 0.6 MG PO TABS
0.6000 mg | ORAL_TABLET | Freq: Once | ORAL | Status: AC
  Administered 2020-08-27: 0.6 mg via ORAL
  Filled 2020-08-27: qty 1

## 2020-08-27 NOTE — Progress Notes (Signed)
PT Cancellation Note  Patient Details Name: Karla Sanchez MRN: [id number] DOB: [birth year]   Cancelled Treatment:    Reason Eval/Treat Not Completed: Other (comment).  Pt on BSC upon PT arrival and pt reporting needing to stay on [facility] for toileting needs--pt not able to participate in therapy session d/t this.  Nurse present with pt when therapist left pt's room.  Will re-attempt PT treatment at a later date/time.  Leitha Bleak, PT 08/27/20, 4:12 PM

## 2020-08-27 NOTE — Progress Notes (Signed)
PT Cancellation Note  Patient Details Name: Karla Sanchez MRN: [id number] DOB: 02/17/1941   Cancelled Treatment:    Reason Eval/Treat Not Completed: Other (comment) Pt pleasant and very talkative during visit.  She was able to do some ankle pumps and a few intermittent reps of hip exercises but was very difficult to keep on task and only completed a few reps over the course of >10 minutes despite repeated cuing and encouragement.  She reports gout pain in wrists (R being the worst) and ankles and is not interested in doing any mobility or as it turns out, a lot of exercises.  Spoke with pt about expected progress, discharge planning and educated on HEP and promoted continued exercise here and moving forward.  Kreg Shropshire, DPT 08/27/2020, 5:14 PM

## 2020-08-27 NOTE — ED Notes (Signed)
Patient provided with meal tray, patient reports she needs soft foods because she does not have teeth in. Requested soft diet from dietary.

## 2020-08-27 NOTE — NC FL2 (Signed)
[place] LEVEL OF CARE SCREENING TOOL     IDENTIFICATION  Patient Name: Karla Sanchez Birthdate: 1940-09-02 Sex: female Admission Date (Current Location): 08/25/2020  [place] and [place] Number:  Engineering geologist and Address:  [facility], [address], [place], [place] [address]      Provider Number: [id number]  Attending Physician Name and Address:  No att. providers found  Relative Name and Phone Number:  Hazel Sams [phone]   [phone]    Current Level of Care:   Recommended Level of Care: [place] Prior Approval Number:    Date Approved/Denied:   PASRR Number: [id number]  Discharge Plan: SNF    Current Diagnoses: Patient Active Problem List   Diagnosis Date Noted  . Acute CHF (congestive heart failure) ([place]) 08/13/2020  . Acute on chronic diastolic heart failure ([place]) 04/25/2020  . SOB (shortness of breath) 04/25/2020  . Elevated troponin 04/25/2020  . B12 deficiency   . Short-term memory loss   . Weakness   . Fungal skin infection   . Obesity (BMI 30-39.9)   . Cellulitis of right leg 01/04/2020  . COPD with acute exacerbation ([place]) 12/08/2019  . Physical deconditioning 12/08/2019  . Acute respiratory failure with hypoxia ([place])   . Iron deficiency anemia   . Type 2 diabetes mellitus without complication, with long-term current use of insulin ([place])   . CHF exacerbation ([place]) 11/20/2019  . Acute on chronic diastolic (congestive) heart failure ([place]) 11/19/2019  . Acute pulmonary edema (HCC)   . Acute on chronic diastolic CHF (congestive heart failure) ([place]) 10/22/2019  . Cellulitis of right foot 10/22/2019  . Non compliance with medical treatment   . Chronic respiratory failure with hypoxia ([place])   . Hypertension   . Chronic kidney disease, stage 3b ([place])   . Acute on chronic respiratory failure with hypoxia ([place])   . Diarrhea   . COPD (chronic obstructive pulmonary  disease) ([place]) 10/03/2019  . Obesity, Class III, BMI 40-49.9 (morbid obesity) ([place]) 10/03/2019  . Chronic diastolic CHF (congestive heart failure) ([place]) 07/09/2019  . DM2 (diabetes mellitus, type 2) ([place]) 07/09/2019  . Peripheral edema 07/09/2019  . Gout 07/09/2019  . Obesity, diabetes, and hypertension syndrome ([place]) 07/09/2019  . Anemia of chronic disease 07/09/2019    Orientation RESPIRATION BLADDER Height & Weight     Self,Time,Situation,Place  Normal Incontinent Weight: 220 lb (99.8 kg) Height:  5\' 6"  (167.6 cm)  BEHAVIORAL SYMPTOMS/MOOD NEUROLOGICAL BOWEL NUTRITION STATUS      Incontinent Diet  AMBULATORY STATUS COMMUNICATION OF NEEDS Skin   Extensive Assist Verbally Normal                       Personal Care Assistance Level of Assistance  Bathing,Feeding,Dressing,Total care Bathing Assistance: Limited assistance Feeding assistance: Limited assistance Dressing Assistance: Limited assistance Total Care Assistance: Maximum assistance   Functional Limitations Info  Sight,Hearing,Speech Sight Info: Adequate Hearing Info: Adequate Speech Info: Adequate    SPECIAL CARE FACTORS FREQUENCY        PT Frequency: 5X per week OT Frequency: 5X per week            Contractures Contractures Info: Not present    Additional Factors Info                  Current Medications (08/27/2020):  This is the current hospital active medication list Current Facility-Administered Medications  Medication Dose Route Frequency Provider Last Rate Last  Admin  . acetaminophen (TYLENOL) tablet 1,000 mg  1,000 mg Oral Q8H PRN Carrie Mew, MD   1,000 mg at 08/26/20 2124  . albuterol (PROVENTIL) (2.5 MG/3ML) 0.083% nebulizer solution 2.5 mg  2.5 mg Inhalation Q6H Arta Silence, MD   2.5 mg at 08/25/20 1909  . atorvastatin (LIPITOR) tablet 20 mg  20 mg Oral Daily Arta Silence, MD   20 mg at 08/27/20 0956  . colchicine tablet 0.6 mg  0.6 mg Oral Daily Arta Silence, MD   0.6 mg at 08/27/20 0956  . furosemide (LASIX) tablet 40 mg  40 mg Oral BID Arta Silence, MD   40 mg at 08/26/20 2321  . hydrALAZINE (APRESOLINE) tablet 25 mg  25 mg Oral Q8H Arta Silence, MD   25 mg at 08/27/20 0718  . insulin detemir (LEVEMIR) injection 15 Units  15 Units Subcutaneous Daily Arta Silence, MD   15 Units at 08/27/20 0957  . isosorbide mononitrate (IMDUR) 24 hr tablet 30 mg  30 mg Oral Daily Arta Silence, MD   30 mg at 08/27/20 0955  . metoprolol tartrate (LOPRESSOR) tablet 25 mg  25 mg Oral BID Arta Silence, MD   25 mg at 08/27/20 0955  . pantoprazole (PROTONIX) EC tablet 40 mg  40 mg Oral Daily Arta Silence, MD   40 mg at 08/25/20 1315  . polyethylene glycol (MIRALAX / GLYCOLAX) packet 17 g  17 g Oral Daily PRN Harvest Dark, MD      . traZODone (DESYREL) tablet 50 mg  50 mg Oral QHS Arta Silence, MD   50 mg at 08/26/20 2322   Current Outpatient Medications  Medication Sig Dispense Refill  . acetaminophen (TYLENOL) 325 MG tablet Take 650 mg by mouth every 6 (six) hours as needed for mild pain or fever.     Marland Kitchen albuterol (VENTOLIN HFA) 108 (90 Base) MCG/ACT inhaler Inhale 2 puffs into the lungs every 6 (six) hours as needed for wheezing or shortness of breath. 8 g 0  . aspirin EC 81 MG tablet Take 1 tablet (81 mg total) by mouth daily. Swallow whole. (Patient not taking: No sig reported) 30 tablet 0  . atorvastatin (LIPITOR) 20 MG tablet Take 1 tablet (20 mg total) by mouth daily. 30 tablet 0  . colchicine 0.6 MG tablet Take 1 tablet (0.6 mg total) by mouth daily as needed (acute gout flare). 30 tablet 0  . dapagliflozin propanediol (FARXIGA) 10 MG TABS tablet Take 1 tablet (10 mg total) by mouth daily. 30 tablet 1  . docusate sodium (COLACE) 100 MG capsule Take 1 capsule (100 mg total) by mouth at bedtime. 30 capsule 0  . ferrous sulfate 325 (65 FE) MG tablet Take 1 tablet (325 mg total) by mouth daily. (Patient  not taking: No sig reported) 30 tablet 0  . furosemide (LASIX) 40 MG tablet Take 80mg  po qam and 40mg  po qpm 60 tablet 2  . hydrALAZINE (APRESOLINE) 25 MG tablet Take 1 tablet (25 mg total) by mouth every 8 (eight) hours. 90 tablet 1  . Insulin Pen Needle 31G X 8 MM MISC 1 Dose by Does not apply route 3 (three) times daily before meals. 200 each 0  . isosorbide mononitrate (IMDUR) 30 MG 24 hr tablet Take 1 tablet (30 mg total) by mouth daily. 30 tablet 0  . LEVEMIR 100 UNIT/ML injection Inject 0.15 mLs (15 Units total) into the skin daily. 10 mL 11  . liver oil-zinc oxide (DESITIN) 40 % ointment Apply  1 application topically in the morning and at bedtime. Uses on buttock 226 g 0  . metoprolol tartrate (LOPRESSOR) 25 MG tablet Take 1 tablet (25 mg total) by mouth 2 (two) times daily. 60 tablet 2  . nystatin (MYCOSTATIN/NYSTOP) powder Apply topically 2 (two) times daily. 60 g 0  . pantoprazole (PROTONIX) 40 MG tablet Take 40 mg by mouth daily.    . potassium chloride (KLOR-CON) 10 MEQ tablet Take 1 tablet (10 mEq total) by mouth daily. 30 tablet 1  . traZODone (DESYREL) 50 MG tablet Take 1 tablet (50 mg total) by mouth at bedtime as needed for sleep. 30 tablet 0     Discharge Medications: Please see discharge summary for a list of discharge medications.  Relevant Imaging Results:  Relevant Lab Results:   Additional Information SS# [id number]  Adelene Amas, LCSWA

## 2020-08-27 NOTE — ED Notes (Signed)
Patient assisted to bedside commode. Patient is now resting comfortably in recliner. Call light in reach. Fall precautions in place.

## 2020-08-27 NOTE — ED Notes (Addendum)
Patient is resting comfortably. Call light in reach. Fall precautions in place. Patient eating breakfast. No other needs at this time.

## 2020-08-27 NOTE — ED Notes (Signed)
Pt requested additional dose of colchicine tonight due to extra dose last night improving gout in the L foot and L wrist. Dr Archie Balboa notified and order obtained.

## 2020-08-27 NOTE — TOC Progression Note (Addendum)
Transition of Care [facility]) - Progression Note    Patient Details  Name: Karla Sanchez MRN: [id number] Date of Birth: [birth year]  Transition of Care [facility]) CM/SW [place], [place] Phone Number: 08/27/2020, 4:12 PM  Clinical Narrative:     CSW spoke with Ebony Hail at [facility] who stated she would take a look at patient's [facility] and referral and let me know about placement. Ebony Hail also stated she thinks patient would qualify for long term care placement and would assist with application if the patient is approved for placement.Ebony Hail is aware patient needs waiver.  CSW left voicemail with admin coordinator at [facility] requesting information about possible placement.   Barriers to Discharge: ED SNF auth  Expected Discharge Plan and Services                                                 Social Determinants of Health (SDOH) Interventions    Readmission Risk Interventions Readmission Risk Prevention Plan 06/10/2020 10/24/2019  Transportation Screening Complete -  Medication Review Press photographer) Complete Complete  PCP or Specialist appointment within 3-5 days of discharge Complete -  [place] or Home Care Consult Complete Complete  SW Recovery Care/Counseling Consult Complete -  Palliative Care Screening Not Applicable -  [place] Complete Patient Refused

## 2020-08-28 DIAGNOSIS — I130 Hypertensive heart and chronic kidney disease with heart failure and stage 1 through stage 4 chronic kidney disease, or unspecified chronic kidney disease: Secondary | ICD-10-CM | POA: Diagnosis not present

## 2020-08-28 MED ORDER — INSULIN PEN NEEDLE 31G X 8 MM MISC: 1.0000 | Freq: Three times a day (TID) | 0 refills | Status: DC

## 2020-08-28 MED ORDER — LEVEMIR 100 UNIT/ML ~~LOC~~ SOLN: 15.0000 [IU] | Freq: Every day | SUBCUTANEOUS | 1 refills | Status: DC

## 2020-08-28 NOTE — ED Notes (Signed)
Nephew returned call to this nurse. Unable to assist with transport at this time. Reviewing other options with charge nurse.

## 2020-08-28 NOTE — ED Notes (Signed)
PATIENT REFUSED VS STATED IT IS TO EARLY FOR VS AND SHE HAS NOT SLEPT IN 2 DAYS AND SHE NEEDS TO SLEEP

## 2020-08-28 NOTE — TOC Transition Note (Signed)
Transition of Care [facility]) - CM/SW Discharge Note   Patient Details  Name: Karla Sanchez MRN: [id number] Date of Birth: July 09, 1940  Transition of Care [facility]) CM/SW Contact:  Ova Freshwater Phone Number: [phone] 08/28/2020, 3:09 PM   Clinical Narrative:     CSW spoke with patient about SNF placement and update her about the co-pay days.  Patient stated she could not afford the co-pay. CSW stated the only other option was to send her back to the [facility] with home health services. Patient stated she would return to the [facility] and then she would drive to [facility]. Patient stated she wanted to contact her nephew Annie Main.  CSW got the guest phone for her and dialed with her nephews number.  EDP/ED Staff updated.    Barriers to Discharge: ED SNF auth   Patient Goals and CMS Choice        Discharge Placement                       Discharge Plan and Services                                     Social Determinants of Health (SDOH) Interventions     Readmission Risk Interventions Readmission Risk Prevention Plan 06/10/2020 10/24/2019  Transportation Screening Complete -  Medication Review Press photographer) Complete Complete  PCP or Specialist appointment within 3-5 days of discharge Complete -  [place] or Home Care Consult Complete Complete  SW Recovery Care/Counseling Consult Complete -  Palliative Care Screening Not Applicable -  [place] Complete Patient Refused

## 2020-08-28 NOTE — ED Notes (Signed)
Pt called out and states that she woke up in a wet bed.  Pt had saturated bed, helped her out of bed and into chair.  Changed her gown, cleaned bed with clorox and changed sheets.  Helped pt back into bed after cleaning her up.  Peri care and applied cream.  Gave pt something to drink, call bell in reach.

## 2020-08-28 NOTE — ED Notes (Signed)
Per Marylyn Ishihara at EMS, pt will not be transported back to hotel from ED. Reason being they are familiar with the patient "she abuses the system, we know for a fact she drives and she has family in [place] that she drives to visit". This RN will let patient know. Offer taxi voucher.

## 2020-08-28 NOTE — ED Notes (Signed)
Called nephew listed in chart, in an attempt to arrange transportation for patient to get taken to her vehicle located at the  [facility]. Nephew did not answer phone, message was left to contact this nurse.

## 2020-08-28 NOTE — ED Notes (Signed)
Called [place] to see if they provide transportation to get patient back to hotel. Hotel receptionist stated there is no shuttle or transportation for patient to come back and that she usually returns by EMS transport.

## 2020-08-28 NOTE — ED Notes (Signed)
Patient assisted to recliner at bedside by this RN. Pt resting comfortably. Call light in reach. Fall precautions in place. No other needs at this time.

## 2020-08-28 NOTE — ED Notes (Signed)
SW at bedside.

## 2020-08-28 NOTE — ED Provider Notes (Signed)
-----------------------------------------  3:38 PM on 08/28/2020 -----------------------------------------  Social work again met with patient today and explained process of SNF placement along with required co-pay.  Patient stated that she could not afford the co-pay and no longer wishes to be placed in SNF.  She instead wishes [facility] so that she can travel to [place] to meet with family.   , , MD 08/28/20 1539  

## 2020-08-28 NOTE — ED Notes (Signed)
Pt resting comfortably in recliner. Fall precautions in place. Call light in reach. No other needs at this time.

## 2020-08-29 NOTE — ED Notes (Signed)
Called per the RN Ladona Mow for transport to [facility] to pick up her car and leave.  [phone]

## 2020-08-29 NOTE — ED Provider Notes (Signed)
-----------------------------------------   5:21 AM on 08/29/2020 -----------------------------------------   Blood pressure (!) 160/75, pulse 69, temperature 98.2 F (36.8 C), temperature source Oral, resp. rate 18, height 5\' 6"  (1.676 m), weight 99.8 kg, SpO2 95 %.  The patient is calm and cooperative at this time.  There have been no acute events since the last update.  Has been awaiting transportation to take her back to motel.   Paulette Blanch, MD 08/29/20 Delrae Rend

## 2020-08-29 NOTE — ED Notes (Signed)
Pt given dry clothes, able to change into dry clothes independently.

## 2020-08-29 NOTE — ED Notes (Signed)
PRN tylenol given per pt request. See mar.

## 2020-08-30 ENCOUNTER — Ambulatory Visit: Payer: Federal, State, Local not specified - PPO | Admitting: Family

## 2020-08-30 ENCOUNTER — Telehealth: Payer: Self-pay | Admitting: Family

## 2020-08-30 NOTE — Telephone Encounter (Signed)
Patient did not show for her [facility] appointment on 08/30/20. Will attempt to reschedule.

## 2020-09-01 ENCOUNTER — Emergency Department: Payer: Medicare Other

## 2020-09-01 ENCOUNTER — Other Ambulatory Visit: Payer: Self-pay

## 2020-09-01 ENCOUNTER — Emergency Department
Admission: EM | Admit: 2020-09-01 | Discharge: 2020-09-01 | Disposition: A | Discharge status: Home / Self Care | Payer: Medicare Other | Attending: Emergency Medicine | Admitting: Emergency Medicine

## 2020-09-01 DIAGNOSIS — N1832 Chronic kidney disease, stage 3b: Secondary | ICD-10-CM | POA: Diagnosis not present

## 2020-09-01 DIAGNOSIS — Z794 Long term (current) use of insulin: Secondary | ICD-10-CM | POA: Diagnosis not present

## 2020-09-01 DIAGNOSIS — I5033 Acute on chronic diastolic (congestive) heart failure: Secondary | ICD-10-CM | POA: Diagnosis not present

## 2020-09-01 DIAGNOSIS — E1122 Type 2 diabetes mellitus with diabetic chronic kidney disease: Secondary | ICD-10-CM | POA: Diagnosis not present

## 2020-09-01 DIAGNOSIS — Z7982 Long term (current) use of aspirin: Secondary | ICD-10-CM | POA: Insufficient documentation

## 2020-09-01 DIAGNOSIS — I130 Hypertensive heart and chronic kidney disease with heart failure and stage 1 through stage 4 chronic kidney disease, or unspecified chronic kidney disease: Secondary | ICD-10-CM | POA: Insufficient documentation

## 2020-09-01 DIAGNOSIS — R0602 Shortness of breath: Secondary | ICD-10-CM | POA: Diagnosis not present

## 2020-09-01 DIAGNOSIS — J441 Chronic obstructive pulmonary disease with (acute) exacerbation: Secondary | ICD-10-CM | POA: Insufficient documentation

## 2020-09-01 DIAGNOSIS — R0789 Other chest pain: Secondary | ICD-10-CM | POA: Diagnosis not present

## 2020-09-01 DIAGNOSIS — Z7984 Long term (current) use of oral hypoglycemic drugs: Secondary | ICD-10-CM | POA: Diagnosis not present

## 2020-09-01 DIAGNOSIS — R079 Chest pain, unspecified: Secondary | ICD-10-CM | POA: Diagnosis present

## 2020-09-01 NOTE — ED Notes (Signed)
Pt refused CXR.  

## 2020-09-01 NOTE — ED Triage Notes (Signed)
[facility], pt states when she got back to the hotel she began to shave sob and chest pain. Pt states she also has a sore bottom.

## 2020-09-01 NOTE — ED Notes (Signed)
Attempting to call cab for pt at thsi time

## 2020-09-01 NOTE — ED Provider Notes (Signed)
[facility] Emergency Department Provider Note ____________________________________________   Event Date/Time   First MD Initiated Contact with Patient 09/01/20 0327     (approximate)  I have reviewed the triage vital signs and the nursing notes.  HISTORY  Chief Complaint Chest Pain   HPI [place] Carmack is a 80 y.o. femalewho presents to the ED for evaluation of chest pain.  Chart review indicates history of obesity, HTN, HLD, CHF, COPD Patient was just seen at [facility] ED less than 24 hours ago, discharged about 12 hours ago.  Seen for chest pain and dyspnea, and was seen by case management.  Benign work-up and discharged home. Nuclear medicine scan 2 weeks ago with no reversible defects and EF of 60%.  Patient presents to the ED for evaluation of 3-4 days of intermittent chest pain and shortness of breath for which she has been evaluated in the ED twice already at [place] and [facility].  She reports driving her car from [facility] ED to a local [place].  She reports an accidental episode of stooling herself while in the car, so she cleaned herself up in the car and went inside.  She reports eating lunch without abdominal pain, nausea or emesis.  She reports continued episodes of intermittent chest pain or shortness of breath without acute changes or new features today.  She reports feeling better now after she has been transported to our ED.  She reports that she may have stooled on herself again and is requesting assistance cleaning herself.  She reports irritation and discomfort to her bottom from this.   Past Medical History:  Diagnosis Date   CHF (congestive heart failure) (HCC)    Colon cancer (HCC)    s/p right and left colectomy 2020   COPD (chronic obstructive pulmonary disease) (HCC)    Diabetes mellitus without complication (HCC)    Diastolic heart failure (HCC)    Gout    Hypertension     Patient Active Problem List   Diagnosis Date  Noted   Acute CHF (congestive heart failure) ([place]) 08/13/2020   Acute on chronic diastolic heart failure ([place]) 04/25/2020   SOB (shortness of breath) 04/25/2020   Elevated troponin 04/25/2020   B12 deficiency    Short-term memory loss    Weakness    Fungal skin infection    Obesity (BMI 30-39.9)    Cellulitis of right leg 01/04/2020   COPD with acute exacerbation ([place]) 12/08/2019   Physical deconditioning 12/08/2019   Acute respiratory failure with hypoxia (HCC)    Iron deficiency anemia    Type 2 diabetes mellitus without complication, with long-term current use of insulin (HCC)    CHF exacerbation ([place]) 11/20/2019   Acute on chronic diastolic (congestive) heart failure ([place]) 11/19/2019   Acute pulmonary edema (HCC)    Acute on chronic diastolic CHF (congestive heart failure) ([place]) 10/22/2019   Cellulitis of right foot 10/22/2019   Non compliance with medical treatment    Chronic respiratory failure with hypoxia ([place])    Hypertension    Chronic kidney disease, stage 3b ([place])    Acute on chronic respiratory failure with hypoxia (HCC)    Diarrhea    COPD (chronic obstructive pulmonary disease) ([place]) 10/03/2019   Obesity, Class III, BMI 40-49.9 (morbid obesity) ([place]) 10/03/2019   Chronic diastolic CHF (congestive heart failure) ([place]) 07/09/2019   DM2 (diabetes mellitus, type 2) ([place]) 07/09/2019   Peripheral edema 07/09/2019   Gout 07/09/2019   Obesity, diabetes, and  hypertension syndrome ([place]) 07/09/2019   Anemia of chronic disease 07/09/2019    Past Surgical History:  Procedure Laterality Date   COLON SURGERY     TUBAL LIGATION  1970    Prior to Admission medications   Medication Sig Start Date End Date Taking? Authorizing Provider  acetaminophen (TYLENOL) 325 MG tablet Take 650 mg by mouth every 6 (six) hours as needed for mild pain or fever.     [provider]  albuterol (VENTOLIN HFA) 108 (90 Base) MCG/ACT inhaler Inhale 2 puffs into the lungs every 6 (six)  hours as needed for wheezing or shortness of breath. 01/09/20   Loletha Grayer, MD  aspirin EC 81 MG tablet Take 1 tablet (81 mg total) by mouth daily. Swallow whole. Patient not taking: No sig reported 01/09/20   Loletha Grayer, MD  atorvastatin (LIPITOR) 20 MG tablet Take 1 tablet (20 mg total) by mouth daily. Patient not taking: Reported on 08/28/2020 08/16/20   Kathie Dike, MD  colchicine 0.6 MG tablet Take 1 tablet (0.6 mg total) by mouth daily as needed (acute gout flare). 06/13/20   Fritzi Mandes, MD  dapagliflozin propanediol (FARXIGA) 10 MG TABS tablet Take 1 tablet (10 mg total) by mouth daily. Patient not taking: Reported on 08/28/2020 08/17/20   Kathie Dike, MD  docusate sodium (COLACE) 100 MG capsule Take 1 capsule (100 mg total) by mouth at bedtime. 01/09/20   Loletha Grayer, MD  ferrous sulfate 325 (65 FE) MG tablet Take 1 tablet (325 mg total) by mouth daily. Patient not taking: No sig reported 01/09/20   Loletha Grayer, MD  furosemide (LASIX) 40 MG tablet Take 80mg  po qam and 40mg  po qpm 08/16/20   Kathie Dike, MD  hydrALAZINE (APRESOLINE) 25 MG tablet Take 1 tablet (25 mg total) by mouth every 8 (eight) hours. Patient not taking: Reported on 08/28/2020 08/16/20   Kathie Dike, MD  Insulin Pen Needle 31G X 8 MM MISC 1 Dose by Does not apply route 3 (three) times daily before meals. 08/28/20   Blake Divine, MD  isosorbide mononitrate (IMDUR) 30 MG 24 hr tablet Take 1 tablet (30 mg total) by mouth daily. Patient not taking: Reported on 08/28/2020 06/14/20   Fritzi Mandes, MD  LEVEMIR 100 UNIT/ML injection Inject 0.15 mLs (15 Units total) into the skin daily. 08/28/20   Blake Divine, MD  liver oil-zinc oxide (DESITIN) 40 % ointment Apply 1 application topically in the morning and at bedtime. Uses on buttock 01/09/20   Loletha Grayer, MD  metoprolol tartrate (LOPRESSOR) 25 MG tablet Take 1 tablet (25 mg total) by mouth 2 (two) times daily. 06/13/20   Fritzi Mandes, MD  nystatin  (MYCOSTATIN/NYSTOP) powder Apply topically 2 (two) times daily. Patient not taking: Reported on 08/28/2020 01/09/20   Loletha Grayer, MD  pantoprazole (PROTONIX) 40 MG tablet Take 40 mg by mouth daily. Patient not taking: Reported on 08/28/2020 08/07/20   [provider]  potassium chloride (KLOR-CON) 10 MEQ tablet Take 1 tablet (10 mEq total) by mouth daily. Patient not taking: Reported on 08/28/2020 06/13/20 06/13/21  Fritzi Mandes, MD  traZODone (DESYREL) 50 MG tablet Take 1 tablet (50 mg total) by mouth at bedtime as needed for sleep. 01/09/20   Loletha Grayer, MD    Allergies Codeine, Other, Exenatide, Levofloxacin, Losartan, and Tetracyclines & related  Family History  Problem Relation Age of Onset   Lung cancer Father     Social History Social History   Tobacco Use   Smoking  status: Never   Smokeless tobacco: Never  Vaping Use   Vaping Use: Never used  Substance Use Topics   Alcohol use: Not Currently   Drug use: Never    Review of Systems  Constitutional: No fever/chills Eyes: No visual changes. ENT: No sore throat. Cardiovascular: Positive for chest pain. Respiratory: Positive for shortness of breath. Gastrointestinal: No abdominal pain.  No nausea, no vomiting.  No diarrhea.  No constipation. Positive for perianal discomfort after an episode of stool incontinence Genitourinary: Negative for dysuria. Musculoskeletal: Negative for back pain. Skin: Negative for rash. Neurological: Negative for headaches, focal weakness or numbness.  ____________________________________________   PHYSICAL EXAM:  VITAL SIGNS: Vitals:   09/01/20 0334  BP: 105/87  Pulse: (!) 56  Resp: 18  Temp: 98.7 F (37.1 C)  SpO2: 94%     Constitutional: Alert and oriented. Well appearing and in no acute distress.  Obese.  Assists with transfers.  Quite loquacious. Eyes: Conjunctivae are normal. PERRL. EOMI. Head: Atraumatic. Nose: No congestion/rhinnorhea. Mouth/Throat: Mucous  membranes are moist.  Oropharynx non-erythematous. Neck: No stridor. No cervical spine tenderness to palpation. Cardiovascular: Normal rate, regular rhythm. Grossly normal heart sounds.  Good peripheral circulation. Respiratory: Normal respiratory effort.  No retractions. Lungs CTAB. Gastrointestinal: Soft , nondistended, nontender to palpation. No CVA tenderness. Rectal exam: Chaperoned by Terri Piedra RN, brown stool in her pants and perianally.  No melena or hematochezia noted.  No hemorrhoids.  Little bit of erythema and irritation from this.  No induration or fluctuance. Musculoskeletal: No lower extremity tenderness.  No joint effusions. No signs of acute trauma. Trace bilateral pitting edema symmetrically Neurologic:  Normal speech and language. No gross focal neurologic deficits are appreciated.  Skin:  Skin is warm, dry and intact. No rash noted. Psychiatric: Mood and affect are normal. Speech and behavior are normal.  ____________________________________________   LABS (all labs ordered are listed, but only abnormal results are displayed)  Labs Reviewed - No data to display ____________________________________________  12 Lead EKG  Sinus rhythm, rate of 56 bpm.  Slightly prolonged PR interval at 226 ms, right bundle and left anterior fascicular blocks without evidence of acute ischemia.  Anterior biphasic T waves and interval changes are consistent with baseline EKG, compared from 6/5 ____________________________________________  RADIOLOGY  ED MD interpretation:  Pt refusing  Official radiology report(s): No results found.  ____________________________________________   PROCEDURES and INTERVENTIONS  Procedure(s) performed (including Critical Care):  .1-3 Lead EKG Interpretation  Date/Time: 09/01/2020 3:49 AM Performed by: Vladimir Crofts, MD Authorized by: Vladimir Crofts, MD     Interpretation: normal     ECG rate:  61   ECG rate assessment: normal     Rhythm: sinus  rhythm     Ectopy: none     Conduction: normal    Medications - No data to display  ____________________________________________   MDM / ED COURSE   Patient presents to the ED with various complaints, requesting something to drink, being cleaned up and sleeping overnight, without evidence of acute pathology, and ultimately amenable to outpatient management.  Normal vitals on room air.  Exam without evidence of acute derangements.  She does have some trace pitting edema bilaterally but no tachypnea, signs of respiratory distress.  Attempted to obtain an x-ray, but she refuses this.  Further refusing additional diagnostics.  Her EKG is nonischemic and at her baseline.  Nursing cleaned her up and she reports feeling better.  We will discharge with return precautions.  Clinical Course as of  09/01/20 0453  Sun Sep 01, 2020  0421 Reassessed.  Patient feeling better after nursing has cleaned her up and given her something to drink.  She is requesting to sleep here until morning. She is refusing x-ray and other diagnostics at this time. We discussed that she is unable to sleep here and not be worked up medically.  We discussed returning her to her hotel, she is agreeable. [DS]    Clinical Course User Index [DS] Vladimir Crofts, MD    ____________________________________________   FINAL CLINICAL IMPRESSION(S) / ED DIAGNOSES  Final diagnoses:  Other chest pain     ED Discharge Orders     None        Maura Braaten Tamala Julian   Note:  This document was prepared using Dragon voice recognition software and may include unintentional dictation errors.    Vladimir Crofts, MD 09/01/20 [phone]

## 2020-10-14 ENCOUNTER — Emergency Department
Admission: EM | Admit: 2020-10-14 | Discharge: 2020-10-14 | Disposition: A | Discharge status: Home / Self Care | Payer: Federal, State, Local not specified - PPO

## 2020-10-14 NOTE — ED Notes (Signed)
Pt refused to get off of the ambulance stretcher, stating that she didn't call 911 and she didn't want to be seen in the ED. Pt was demanding to be taken back to her vehicle by ems, it was told to pt that we could not do that. Pt refusing to be placed into a wheelchair. Pt yelling and cursing in the hall. Security was called, this RN and EDT placed pt into wheel chair by lifting green sheet and carefully transferring pt. Pt yelling she wants to be taken out to her car. Pt wheeled by [place], EDT to her vehicle.

## 2021-01-22 IMAGING — DX DG CHEST 1V PORT
1 series · 1 of 1 positions shown · non-contrast
Comparison: October 22, 2019

CLINICAL DATA: Cough and shortness of breath

EXAM:
PORTABLE CHEST 1 VIEW

[chest ap]
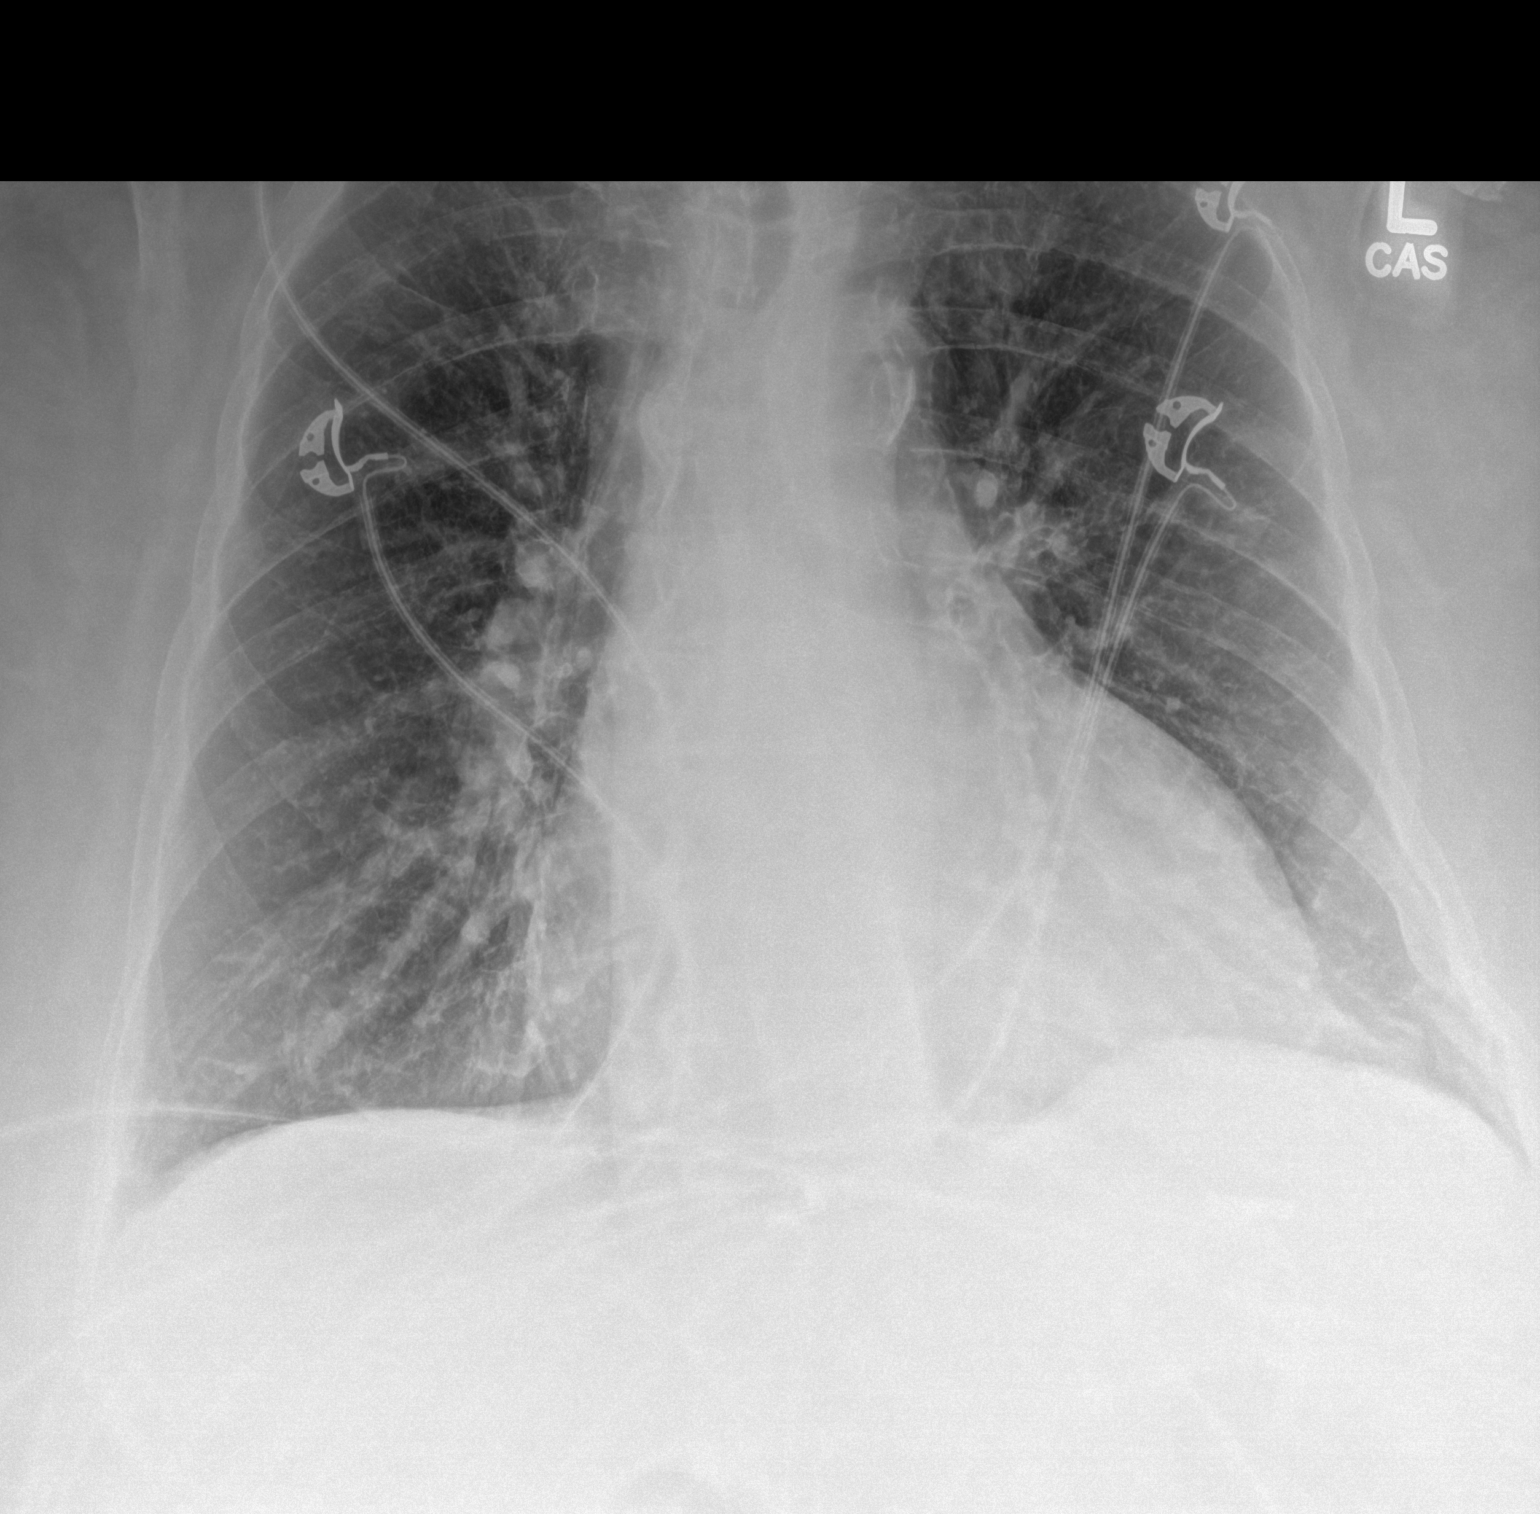

[1 of 1 positions shown; findings below may reference images not displayed]

FINDINGS: The heart size and mediastinal contours are unchanged with mild
cardiomegaly. Aortic knob calcifications. There is mild prominence
of the central pulmonary vasculature. No large airspace
consolidation or pleural effusion. No acute osseous abnormality.
Advanced bilateral shoulder osteoarthritis is seen.
IMPRESSION: Mild cardiomegaly and pulmonary vascular congestion.

## 2021-03-29 DIAGNOSIS — I5030 Unspecified diastolic (congestive) heart failure: Secondary | ICD-10-CM | POA: Diagnosis not present

## 2021-03-29 DIAGNOSIS — Z20822 Contact with and (suspected) exposure to covid-19: Secondary | ICD-10-CM | POA: Diagnosis not present

## 2021-03-29 DIAGNOSIS — Z7982 Long term (current) use of aspirin: Secondary | ICD-10-CM | POA: Diagnosis not present

## 2021-03-29 DIAGNOSIS — E119 Type 2 diabetes mellitus without complications: Secondary | ICD-10-CM | POA: Diagnosis not present

## 2021-03-29 DIAGNOSIS — Z794 Long term (current) use of insulin: Secondary | ICD-10-CM | POA: Diagnosis not present

## 2021-03-29 DIAGNOSIS — Z79899 Other long term (current) drug therapy: Secondary | ICD-10-CM | POA: Diagnosis not present

## 2021-03-29 DIAGNOSIS — I110 Hypertensive heart disease with heart failure: Secondary | ICD-10-CM | POA: Diagnosis not present

## 2021-03-29 DIAGNOSIS — R531 Weakness: Secondary | ICD-10-CM | POA: Diagnosis present

## 2021-03-29 DIAGNOSIS — J449 Chronic obstructive pulmonary disease, unspecified: Secondary | ICD-10-CM | POA: Diagnosis not present

## 2021-03-29 DIAGNOSIS — N390 Urinary tract infection, site not specified: Secondary | ICD-10-CM | POA: Diagnosis not present

## 2021-03-29 DIAGNOSIS — Z85038 Personal history of other malignant neoplasm of large intestine: Secondary | ICD-10-CM | POA: Diagnosis not present

## 2021-03-29 LAB — CBC WITH DIFFERENTIAL/PLATELET
Abs Immature Granulocytes: 0.02 10*3/uL (ref 0.00–0.07)
Basophils Absolute: 0 10*3/uL (ref 0.0–0.1)
Basophils Relative: 0 %
Eosinophils Absolute: 0.1 10*3/uL (ref 0.0–0.5)
Eosinophils Relative: 2 %
HCT: 38.5 % (ref 36.0–46.0)
Hemoglobin: 12.2 g/dL (ref 12.0–15.0)
Immature Granulocytes: 0 %
Lymphocytes Relative: 24 %
Lymphs Abs: 1.8 10*3/uL (ref 0.7–4.0)
MCH: 26.8 pg (ref 26.0–34.0)
MCHC: 31.7 g/dL (ref 30.0–36.0)
MCV: 84.6 fL (ref 80.0–100.0)
Monocytes Absolute: 0.6 10*3/uL (ref 0.1–1.0)
Monocytes Relative: 8 %
Neutro Abs: 4.7 10*3/uL (ref 1.7–7.7)
Neutrophils Relative %: 66 %
Platelets: 246 10*3/uL (ref 150–400)
RBC: 4.55 MIL/uL (ref 3.87–5.11)
RDW: 15.7 % — ABNORMAL HIGH (ref 11.5–15.5)
WBC: 7.2 10*3/uL (ref 4.0–10.5)
nRBC: 0 % (ref 0.0–0.2)

## 2021-03-29 LAB — COMPREHENSIVE METABOLIC PANEL
ALT: 11 U/L (ref 0–44)
AST: 12 U/L — ABNORMAL LOW (ref 15–41)
Albumin: 2.8 g/dL — ABNORMAL LOW (ref 3.5–5.0)
Alkaline Phosphatase: 83 U/L (ref 38–126)
Anion gap: 8 (ref 5–15)
BUN: 27 mg/dL — ABNORMAL HIGH (ref 8–23)
CO2: 25 mmol/L (ref 22–32)
Calcium: 8.5 mg/dL — ABNORMAL LOW (ref 8.9–10.3)
Chloride: 106 mmol/L (ref 98–111)
Creatinine, Ser: 1.23 mg/dL — ABNORMAL HIGH (ref 0.44–1.00)
GFR, Estimated: 44 mL/min — ABNORMAL LOW (ref >60)
Glucose, Bld: 159 mg/dL — ABNORMAL HIGH (ref 70–99)
Potassium: 4.6 mmol/L (ref 3.5–5.1)
Sodium: 139 mmol/L (ref 135–145)
Total Bilirubin: 0.7 mg/dL (ref 0.3–1.2)
Total Protein: 5.4 g/dL — ABNORMAL LOW (ref 6.5–8.1)

## 2021-03-29 LAB — RESP PANEL BY RT-PCR (FLU A&B, COVID) ARPGX2
Influenza A by PCR: NEGATIVE
Influenza B by PCR: NEGATIVE
SARS Coronavirus 2 by RT PCR: NEGATIVE

## 2021-03-29 LAB — CBG MONITORING, ED: Glucose-Capillary: 139 mg/dL — ABNORMAL HIGH (ref 70–99)

## 2021-03-29 NOTE — ED Notes (Signed)
Pts family friend [facility]) contacted with an update.

## 2021-03-29 NOTE — ED Notes (Signed)
Report from ems: pt here with weakness, blood pressure 106/68.

## 2021-03-29 NOTE — ED Triage Notes (Signed)
Pt presents via EMS with a complaint of weakness. Pt states she has not been feeling well for a while - shes wheelchair bound and feels her "health is declining". Denies SOB or CP.

## 2021-03-30 ENCOUNTER — Observation Stay
Admission: EM | Admit: 2021-03-30 | Discharge: 2021-03-30 | Discharge status: Against Medical Advice | Payer: Medicare Other | Attending: Internal Medicine | Admitting: Internal Medicine

## 2021-03-30 ENCOUNTER — Observation Stay: Payer: Medicare Other

## 2021-03-30 DIAGNOSIS — R531 Weakness: Secondary | ICD-10-CM | POA: Diagnosis not present

## 2021-03-30 DIAGNOSIS — Z79899 Other long term (current) drug therapy: Secondary | ICD-10-CM | POA: Insufficient documentation

## 2021-03-30 DIAGNOSIS — M109 Gout, unspecified: Secondary | ICD-10-CM

## 2021-03-30 DIAGNOSIS — Z7982 Long term (current) use of aspirin: Secondary | ICD-10-CM | POA: Insufficient documentation

## 2021-03-30 DIAGNOSIS — Z794 Long term (current) use of insulin: Secondary | ICD-10-CM | POA: Insufficient documentation

## 2021-03-30 DIAGNOSIS — N3001 Acute cystitis with hematuria: Secondary | ICD-10-CM

## 2021-03-30 DIAGNOSIS — I5032 Chronic diastolic (congestive) heart failure: Secondary | ICD-10-CM

## 2021-03-30 DIAGNOSIS — N3000 Acute cystitis without hematuria: Secondary | ICD-10-CM

## 2021-03-30 DIAGNOSIS — N390 Urinary tract infection, site not specified: Principal | ICD-10-CM | POA: Insufficient documentation

## 2021-03-30 DIAGNOSIS — E119 Type 2 diabetes mellitus without complications: Secondary | ICD-10-CM | POA: Insufficient documentation

## 2021-03-30 DIAGNOSIS — I5030 Unspecified diastolic (congestive) heart failure: Secondary | ICD-10-CM | POA: Insufficient documentation

## 2021-03-30 DIAGNOSIS — Z85038 Personal history of other malignant neoplasm of large intestine: Secondary | ICD-10-CM | POA: Insufficient documentation

## 2021-03-30 DIAGNOSIS — J449 Chronic obstructive pulmonary disease, unspecified: Secondary | ICD-10-CM | POA: Insufficient documentation

## 2021-03-30 DIAGNOSIS — I110 Hypertensive heart disease with heart failure: Secondary | ICD-10-CM | POA: Insufficient documentation

## 2021-03-30 DIAGNOSIS — Z20822 Contact with and (suspected) exposure to covid-19: Secondary | ICD-10-CM | POA: Insufficient documentation

## 2021-03-30 LAB — URINALYSIS, ROUTINE W REFLEX MICROSCOPIC
Bilirubin Urine: NEGATIVE
Glucose, UA: 50 mg/dL — AB
Ketones, ur: NEGATIVE mg/dL
Nitrite: NEGATIVE
Protein, ur: >=300 mg/dL — AB
Specific Gravity, Urine: 1.012 (ref 1.005–1.030)
WBC, UA: >50 WBC/hpf — ABNORMAL HIGH (ref 0–5)
pH: 5 (ref 5.0–8.0)

## 2021-03-30 LAB — BASIC METABOLIC PANEL
Anion gap: 6 (ref 5–15)
BUN: 28 mg/dL — ABNORMAL HIGH (ref 8–23)
CO2: 27 mmol/L (ref 22–32)
Calcium: 8.4 mg/dL — ABNORMAL LOW (ref 8.9–10.3)
Chloride: 106 mmol/L (ref 98–111)
Creatinine, Ser: 1.26 mg/dL — ABNORMAL HIGH (ref 0.44–1.00)
GFR, Estimated: 43 mL/min — ABNORMAL LOW (ref >60)
Glucose, Bld: 174 mg/dL — ABNORMAL HIGH (ref 70–99)
Potassium: 5.1 mmol/L (ref 3.5–5.1)
Sodium: 139 mmol/L (ref 135–145)

## 2021-03-30 LAB — CBC
HCT: 38.2 % (ref 36.0–46.0)
Hemoglobin: 11.7 g/dL — ABNORMAL LOW (ref 12.0–15.0)
MCH: 26.2 pg (ref 26.0–34.0)
MCHC: 30.6 g/dL (ref 30.0–36.0)
MCV: 85.7 fL (ref 80.0–100.0)
Platelets: 255 10*3/uL (ref 150–400)
RBC: 4.46 MIL/uL (ref 3.87–5.11)
RDW: 15.9 % — ABNORMAL HIGH (ref 11.5–15.5)
WBC: 7.4 10*3/uL (ref 4.0–10.5)
nRBC: 0 % (ref 0.0–0.2)

## 2021-03-30 LAB — HEMOGLOBIN A1C
Hgb A1c MFr Bld: 7.4 % — ABNORMAL HIGH (ref 4.8–5.6)
Mean Plasma Glucose: 165.68 mg/dL

## 2021-03-30 LAB — TSH: TSH: 1.457 u[IU]/mL (ref 0.350–4.500)

## 2021-03-30 MED ORDER — LACTATED RINGERS IV BOLUS: 1000.0000 mL | Freq: Once | INTRAVENOUS | Status: DC

## 2021-03-30 MED ORDER — SODIUM CHLORIDE 0.9 % IV SOLN: 1.0000 g | Freq: Once | INTRAVENOUS | Status: DC

## 2021-03-30 MED ORDER — INSULIN DETEMIR 100 UNIT/ML ~~LOC~~ SOLN
15.0000 [IU] | Freq: Every day | SUBCUTANEOUS | Status: DC
  Filled 2021-03-30: qty 0.15

## 2021-03-30 MED ORDER — ACETAMINOPHEN 325 MG PO TABS: 650.0000 mg | ORAL_TABLET | Freq: Four times a day (QID) | ORAL | Status: DC | PRN

## 2021-03-30 MED ORDER — SENNA 8.6 MG PO TABS: 1.0000 | ORAL_TABLET | Freq: Two times a day (BID) | ORAL | Status: DC

## 2021-03-30 MED ORDER — TRAZODONE HCL 50 MG PO TABS: 50.0000 mg | ORAL_TABLET | Freq: Every evening | ORAL | Status: DC | PRN

## 2021-03-30 MED ORDER — ENOXAPARIN SODIUM 40 MG/0.4ML IJ SOSY: 40.0000 mg | PREFILLED_SYRINGE | INTRAMUSCULAR | Status: DC

## 2021-03-30 MED ORDER — INSULIN ASPART 100 UNIT/ML IJ SOLN: 0.0000 [IU] | Freq: Three times a day (TID) | INTRAMUSCULAR | Status: DC

## 2021-03-30 MED ORDER — ASPIRIN EC 81 MG PO TBEC: 81.0000 mg | DELAYED_RELEASE_TABLET | Freq: Every day | ORAL | Status: DC

## 2021-03-30 MED ORDER — INSULIN ASPART 100 UNIT/ML IJ SOLN: 0.0000 [IU] | Freq: Every day | INTRAMUSCULAR | Status: DC

## 2021-03-30 MED ORDER — FERROUS SULFATE 325 (65 FE) MG PO TABS: 325.0000 mg | ORAL_TABLET | Freq: Every day | ORAL | Status: DC

## 2021-03-30 MED ORDER — ATORVASTATIN CALCIUM 20 MG PO TABS: 20.0000 mg | ORAL_TABLET | Freq: Every day | ORAL | Status: DC

## 2021-03-30 MED ORDER — POLYETHYLENE GLYCOL 3350 17 G PO PACK: 17.0000 g | PACK | Freq: Every day | ORAL | Status: DC | PRN

## 2021-03-30 MED ORDER — ALBUTEROL SULFATE (2.5 MG/3ML) 0.083% IN NEBU: 2.5000 mg | INHALATION_SOLUTION | Freq: Four times a day (QID) | RESPIRATORY_TRACT | Status: DC | PRN

## 2021-03-30 MED ORDER — ALBUTEROL SULFATE HFA 108 (90 BASE) MCG/ACT IN AERS: 2.0000 | INHALATION_SPRAY | Freq: Four times a day (QID) | RESPIRATORY_TRACT | Status: DC | PRN

## 2021-03-30 MED ORDER — ACETAMINOPHEN 325 MG RE SUPP: 650.0000 mg | Freq: Four times a day (QID) | RECTAL | Status: DC | PRN

## 2021-03-30 MED ORDER — DAPAGLIFLOZIN PROPANEDIOL 10 MG PO TABS: 10.0000 mg | ORAL_TABLET | Freq: Every day | ORAL | Status: DC

## 2021-03-30 MED ORDER — ENOXAPARIN SODIUM 60 MG/0.6ML IJ SOSY: 0.5000 mg/kg | PREFILLED_SYRINGE | INTRAMUSCULAR | Status: DC

## 2021-03-30 NOTE — Significant Event (Signed)
Alerted that patient would like to leave the hospital. Informed her of risks of leaving, to which she verbalizes understanding. By the time of my evaluation, she had removed her IV. She signed AMA paperwork after our discussion. RN to escort to lobby with wheelchair and supply clothing for departure.  Rollene Fare, MD MPH Hospitalist

## 2021-03-30 NOTE — ED Notes (Signed)
Patient threatening to rip out her IV that IV team placed. Refusing IV medication until she speaks with MD again. MD Cecille Rubin made aware through secure chat

## 2021-03-30 NOTE — ED Provider Notes (Addendum)
[facility] Provider Note    None    (approximate)   History   Weakness   HPI  Karla Sanchez is a 81 y.o. female  here with generalized weakness. Pt reports she was just discharged from a SNF, currently is living in a hotel. She drove to the hotel yesterday from [place] and did not eat/drink much. The cafeteria was closed that afternoon and she was trying to eat chili but it was "too spicy." Reports that she began to feel very weak, tired, and like she was going to pass out. Someone at the hotel came to check on her and called ems because she "looked so bad." She says she felt weak, tired. Felt like she was going to pass out. She now feels somewhat better though she still has not eaten or drank much. She does not dysuria, frequency for last few days and she has a h/o recurrent UTIs. No nausea, vomiting. No flank pain. No focal numbness or weakness.       Physical Exam   Triage Vital Signs: ED Triage Vitals  Enc Vitals Group     BP 03/29/21 1947 134/65     Pulse Rate 03/29/21 1947 65     Resp 03/29/21 1947 18     Temp 03/29/21 1947 97.6 F (36.4 C)     Temp Source 03/29/21 1947 Oral     SpO2 03/29/21 1947 96 %     Weight 03/29/21 1953 250 lb 11.2 oz (113.7 kg)     Height 03/29/21 1953 5\' 6"  (1.676 m)     Head Circumference --      Peak Flow --      Pain Score 03/29/21 1953 8     Pain Loc --      Pain Edu? --      Excl. in [place]? --     Most recent vital signs: Vitals:   03/29/21 2223 03/30/21 0238  BP: (!) 156/51 (!) 156/51  Pulse: 71 71  Resp: 16   Temp:    SpO2: 93%      General: Awake, no distress.  CV:  Good peripheral perfusion.  Resp:  Normal effort. Normal WOB. Clear breath sounds bilaterally. Abd:  No distention. Mild suprapubic TTP. No CVAT. Other:  No focal deficits. MAE. AOx4.    ED Results / Procedures / Treatments   Labs (all labs ordered are listed, but only abnormal results are displayed) Labs Reviewed  CBC  WITH DIFFERENTIAL/PLATELET - Abnormal; Notable for the following components:      Result Value   RDW 15.7 (*)    All other components within normal limits  COMPREHENSIVE METABOLIC PANEL - Abnormal; Notable for the following components:   Glucose, Bld 159 (*)    BUN 27 (*)    Creatinine, Ser 1.23 (*)    Calcium 8.5 (*)    Total Protein 5.4 (*)    Albumin 2.8 (*)    AST 12 (*)    GFR, Estimated 44 (*)    All other components within normal limits  URINALYSIS, ROUTINE W REFLEX MICROSCOPIC - Abnormal; Notable for the following components:   Color, Urine YELLOW (*)    APPearance TURBID (*)    Glucose, UA 50 (*)    Hgb urine dipstick MODERATE (*)    Protein, ur >=300 (*)    Leukocytes,Ua LARGE (*)    WBC, UA >50 (*)    Bacteria, UA MANY (*)    All other components  within normal limits  CBG MONITORING, ED - Abnormal; Notable for the following components:   Glucose-Capillary 139 (*)    All other components within normal limits  RESP PANEL BY RT-PCR (FLU A&B, COVID) ARPGX2  URINE CULTURE  CULTURE, BLOOD (ROUTINE X 2)  CULTURE, BLOOD (ROUTINE X 2)  BASIC METABOLIC PANEL  CBC  TSH     EKG  Normal sinus rhythm, VR 64. PR 232, QRS 166, QTc 468. No acute St elevation or depressions.   RADIOLOGY     PROCEDURES:  Critical Care performed: No  .1-3 Lead EKG Interpretation Performed by: Duffy Bruce, MD Authorized by: Duffy Bruce, MD     Interpretation: normal     ECG rate:  70-90   ECG rate assessment: normal     Rhythm: sinus rhythm     Ectopy: none     Conduction: normal   Comments:     Indication: Weakness    MEDICATIONS ORDERED IN ED: Medications  meropenem (MERREM) 1 g in sodium chloride 0.9 % 100 mL IVPB (has no administration in time range)  acetaminophen (TYLENOL) tablet 650 mg (has no administration in time range)    Or  acetaminophen (TYLENOL) suppository 650 mg (has no administration in time range)  polyethylene glycol (MIRALAX / GLYCOLAX) packet  17 g (has no administration in time range)  senna (SENOKOT) tablet 8.6 mg (has no administration in time range)  enoxaparin (LOVENOX) injection 57.5 mg (has no administration in time range)  aspirin EC tablet 81 mg (has no administration in time range)  atorvastatin (LIPITOR) tablet 20 mg (has no administration in time range)  dapagliflozin propanediol (FARXIGA) tablet 10 mg (has no administration in time range)  ferrous sulfate tablet 325 mg (has no administration in time range)  traZODone (DESYREL) tablet 50 mg (has no administration in time range)  albuterol (PROVENTIL) (2.5 MG/3ML) 0.083% nebulizer solution 2.5 mg (has no administration in time range)     IMPRESSION / MDM / ASSESSMENT AND PLAN / ED COURSE  I reviewed the triage vital signs and the nursing notes.                               The patient is on the cardiac monitor to evaluate for evidence of arrhythmia and/or significant heart rate changes.   Ddx: UTI with generalized weakness, dehydration, PNA, CVA, metabolic encephalopathy, deconditioning, polypharmacy.  81 yo F here with generalized weakness. H/o recurrent UTIs with similar symptoms. Pt is afebrile but weak here, unable to transition as she normally can. Labs show no leukocytosis. CMP with elevated BUN:Cr ratio possibly from dehydration. UA concerning for UTI. Reviewed old cultures from [facility] - pt has h/o ESBL E. Coli. Will start meropenem, admit given her age, weakness. Consulted hospitalist who is in agreement, will admit. Reviewed prior [facility] and admission records, has been admitted for both UTIs and CHF with weakness.   MEDICATIONS GIVEN IN ED: Medications  meropenem (MERREM) 1 g in sodium chloride 0.9 % 100 mL IVPB (has no administration in time range)  acetaminophen (TYLENOL) tablet 650 mg (has no administration in time range)    Or  acetaminophen (TYLENOL) suppository 650 mg (has no administration in time range)  polyethylene glycol (MIRALAX / GLYCOLAX) packet  17 g (has no administration in time range)  senna (SENOKOT) tablet 8.6 mg (has no administration in time range)  enoxaparin (LOVENOX) injection 57.5 mg (has no administration in time range)  aspirin EC tablet 81 mg (has no administration in time range)  atorvastatin (LIPITOR) tablet 20 mg (has no administration in time range)  dapagliflozin propanediol (FARXIGA) tablet 10 mg (has no administration in time range)  ferrous sulfate tablet 325 mg (has no administration in time range)  traZODone (DESYREL) tablet 50 mg (has no administration in time range)  albuterol (PROVENTIL) (2.5 MG/3ML) 0.083% nebulizer solution 2.5 mg (has no administration in time range)    Consults: Hospitalist   EMR reviewed [facility] records from recent admissions, old H&P from admissions here at [facility], culture results from [facility] IMPRESSION(S) / ED DIAGNOSES   Final diagnoses:  Acute cystitis with hematuria  Generalized weakness     Rx / DC Orders   ED Discharge Orders     None        Note:  This document was prepared using Dragon voice recognition software and may include unintentional dictation errors.   Duffy Bruce, MD 03/30/21 1536    Duffy Bruce, MD 03/30/21 1537

## 2021-03-30 NOTE — ED Notes (Signed)
Lab contacted to send phlebotomist  

## 2021-03-30 NOTE — ED Notes (Signed)
Pt requesting RN to rub and scratch her back, complied with pt request. Explanation of wait provided to pt who verbalizes understanding.

## 2021-03-30 NOTE — Progress Notes (Signed)
PHARMACIST - PHYSICIAN COMMUNICATION  CONCERNING:  Enoxaparin (Lovenox) for DVT Prophylaxis    RECOMMENDATION: Patient was prescribed enoxaprin 40mg  q24 hours for VTE prophylaxis.   Filed Weights   03/29/21 1953  Weight: 113.7 kg (250 lb 11.2 oz)    Body mass index is 40.46 kg/m.  Estimated Creatinine Clearance: 46.7 mL/min (A) (by C-G formula based on SCr of 1.23 mg/dL (H)).   Based on [place] patient is candidate for enoxaparin 0.5mg /kg TBW SQ every 24 hours based on BMI being >30.  DESCRIPTION: Pharmacy has adjusted enoxaparin dose per [facility] policy.  Patient is now receiving enoxaparin 57.5 mg every 24 hours    Berta Minor, PharmD Clinical Pharmacist  03/30/2021 3:17 PM

## 2021-03-30 NOTE — ED Notes (Signed)
Patient refuses to get in stretcher. Patient in recliner at this time. Pad under patient changed. New gown placed on patient.

## 2021-03-30 NOTE — ED Notes (Signed)
This float RN discharged this pt per previous shift orders and discussion with the doctor. This rn wheeled pt out to the lobby staff notified in lobby. Pt given [facility] cell number because that is where pt live at this time. Pt verbally understood AMA and signed the paper in her chart.

## 2021-03-30 NOTE — ED Notes (Signed)
Patient given phone upon request

## 2021-03-30 NOTE — Consult Note (Signed)
PHARMACY -  BRIEF ANTIBIOTIC NOTE   Pharmacy has received consult(s) for meropenem from an ED provider.  The patient's profile has been reviewed for ht/wt/allergies/indication/available labs.    One time order(s) placed for  --Meropenem 1 g IV  Further antibiotics/pharmacy consults should be ordered by admitting physician if indicated.                       Thank you, Benita Gutter 03/30/2021  2:02 PM

## 2021-03-30 NOTE — ED Notes (Signed)
Pt sleeping, no acute distress noted

## 2021-03-30 NOTE — H&P (Addendum)
History and Physical    Karla Sanchez ZYY:[id number] DOB: [birth year] DOA: 03/30/2021  PCP: Alfredia Client, MD  Patient coming from: home  Chief Complaint: weakness  HPI: Karla Sanchez is a 81 y.o. female with medical history significant for HFpEF, HTN, Gout, IDDM who presents with complaints of weakness.  She states she was in her usual state of health until about 430/5pm the night prior to admission. She began feeling significant weakness and lightheadedness. She denies any preceding nausea, vomiting, diarrhea, dysuria, hematuria, palpitations, headache, cough, chest pain, dyspnea. She denies any sick contacts and reports that she was recently discharged from a SNF after a recent hospital stay. She otherwise denies any other localizing symptoms.  ED Course: In the ED, she was monitored and initial lab workup was unrevealing. She was not able to transition as per her norm and given concern for safety of disposition and concern for UTI, she was admitted for further workup and evaluation.  Review of Systems: As per HPI otherwise all other systems reviewed and are negative.   Past Medical History:  Diagnosis Date   CHF (congestive heart failure) (HCC)    Colon cancer (HCC)    s/p right and left colectomy 2020   COPD (chronic obstructive pulmonary disease) (HCC)    Diabetes mellitus without complication (HCC)    Diastolic heart failure ([place])    Gout    Hypertension     Past Surgical History:  Procedure Laterality Date   [place]    Social History  reports that she has never smoked. She has never used smokeless tobacco. She reports that she does not currently use alcohol. She reports that she does not use drugs.  Allergies  Allergen Reactions   Codeine Nausea Only, Other (See Comments) and Swelling    Other Reaction: nausea and vomiting Other reaction(s): NAUSEA    Other Other (See Comments)    Other reaction(s):  SWELLING  Other reaction(s): MUSCLE PAIN Muscle aches Other reaction(s): MUSCLE PAIN Muscle aches Other reaction(s): MUSCLE PAIN Muscle aches    Exenatide Nausea And Vomiting   Levofloxacin Other (See Comments)    Other Reaction: sloughing of buccal mucosa Other reaction(s): OTHER Lost skin in mouth Patient states she can take Cipro without difficulty and has taken it many times in the past Other Reaction: sloughing of buccal mucosa    Losartan Other (See Comments)    Weight gain Weight gain    Tetracyclines & Related Other (See Comments)    Family History  Problem Relation Age of Onset   Lung cancer Father     Prior to Admission medications   Medication Sig Start Date End Date Taking? Authorizing Provider  acetaminophen (TYLENOL) 325 MG tablet Take 650 mg by mouth every 6 (six) hours as needed for mild pain or fever.     [provider]  albuterol (VENTOLIN HFA) 108 (90 Base) MCG/ACT inhaler Inhale 2 puffs into the lungs every 6 (six) hours as needed for wheezing or shortness of breath. 01/09/20   Loletha Grayer, MD  aspirin EC 81 MG tablet Take 1 tablet (81 mg total) by mouth daily. Swallow whole. Patient not taking: No sig reported 01/09/20   Loletha Grayer, MD  atorvastatin (LIPITOR) 20 MG tablet Take 1 tablet (20 mg total) by mouth daily. Patient not taking: Reported on 08/28/2020 08/16/20   Kathie Dike, MD  colchicine 0.6 MG tablet Take 1 tablet (0.6 mg total) by mouth  daily as needed (acute gout flare). 06/13/20   Fritzi Mandes, MD  dapagliflozin propanediol (FARXIGA) 10 MG TABS tablet Take 1 tablet (10 mg total) by mouth daily. Patient not taking: Reported on 08/28/2020 08/17/20   Kathie Dike, MD  docusate sodium (COLACE) 100 MG capsule Take 1 capsule (100 mg total) by mouth at bedtime. 01/09/20   Loletha Grayer, MD  ferrous sulfate 325 (65 FE) MG tablet Take 1 tablet (325 mg total) by mouth daily. Patient not taking: No sig reported 01/09/20   Loletha Grayer, MD  furosemide (LASIX) 40 MG tablet Take 80mg  po qam and 40mg  po qpm 08/16/20   Kathie Dike, MD  hydrALAZINE (APRESOLINE) 25 MG tablet Take 1 tablet (25 mg total) by mouth every 8 (eight) hours. Patient not taking: Reported on 08/28/2020 08/16/20   Kathie Dike, MD  Insulin Pen Needle 31G X 8 MM MISC 1 Dose by Does not apply route 3 (three) times daily before meals. 08/28/20   Blake Divine, MD  LEVEMIR 100 UNIT/ML injection Inject 0.15 mLs (15 Units total) into the skin daily. 08/28/20   Blake Divine, MD  liver oil-zinc oxide (DESITIN) 40 % ointment Apply 1 application topically in the morning and at bedtime. Uses on buttock 01/09/20   Loletha Grayer, MD  metoprolol tartrate (LOPRESSOR) 25 MG tablet Take 1 tablet (25 mg total) by mouth 2 (two) times daily. 06/13/20   Fritzi Mandes, MD  nystatin (MYCOSTATIN/NYSTOP) powder Apply topically 2 (two) times daily. Patient not taking: Reported on 08/28/2020 01/09/20   Loletha Grayer, MD  potassium chloride (KLOR-CON) 10 MEQ tablet Take 1 tablet (10 mEq total) by mouth daily. Patient not taking: Reported on 08/28/2020 06/13/20 06/13/21  Fritzi Mandes, MD  traZODone (DESYREL) 50 MG tablet Take 1 tablet (50 mg total) by mouth at bedtime as needed for sleep. 01/09/20   Loletha Grayer, MD    Physical Exam: Vitals:   03/29/21 1953 03/29/21 1956 03/29/21 2223 03/30/21 0238  BP:   (!) 156/51 (!) 156/51  Pulse:   71 71  Resp:   16   Temp:      TempSrc:      SpO2:  97% 93%   Weight: 113.7 kg     Height: 5\' 6"  (1.676 m)       Constitutional: NAD, calm, comfortable Vitals:   03/29/21 1953 03/29/21 1956 03/29/21 2223 03/30/21 0238  BP:   (!) 156/51 (!) 156/51  Pulse:   71 71  Resp:   16   Temp:      TempSrc:      SpO2:  97% 93%   Weight: 113.7 kg     Height: 5\' 6"  (1.676 m)      Eyes: PERRL, lids and conjunctivae normal ENMT: Mucous membranes are moist. .  Neck: normal, supple, no thyromegaly Respiratory: clear to auscultation  bilaterally, no wheezing, no crackles. Normal respiratory effort. No accessory muscle use.  Cardiovascular: Regular rate and rhythm, crescendo-decrescendo murmur. Pitting posterior calf edema.   Abdomen: no significant tenderness, no masses palpated. Obese, bowel sounds positive.  Musculoskeletal: no clubbing / cyanosis. No joint deformity upper and lower extremities. Moving all extremities Skin: no rashes, lesions, ulcers. No induration Neurologic: Sensation intact, alert, oriented Psychiatric: Normal judgment and insight. Alert and oriented x 3. Normal mood.   Labs on Admission: I have personally reviewed following labs and imaging studies  CBC: Recent Labs  Lab 03/29/21 2022  WBC 7.2  NEUTROABS 4.7  HGB 12.2  HCT 38.5  MCV 84.6  PLT 030    Basic Metabolic Panel: Recent Labs  Lab 03/29/21 2022  NA 139  K 4.6  CL 106  CO2 25  GLUCOSE 159*  BUN 27*  CREATININE 1.23*  CALCIUM 8.5*    GFR: Estimated Creatinine Clearance: 46.7 mL/min (A) (by C-G formula based on SCr of 1.23 mg/dL (H)).  Liver Function Tests: Recent Labs  Lab 03/29/21 2022  AST 12*  ALT 11  ALKPHOS 83  BILITOT 0.7  PROT 5.4*  ALBUMIN 2.8*    Urine analysis:    Component Value Date/Time   COLORURINE YELLOW (A) 03/30/2021 1315   APPEARANCEUR TURBID (A) 03/30/2021 1315   LABSPEC 1.012 03/30/2021 1315   PHURINE 5.0 03/30/2021 1315   GLUCOSEU 50 (A) 03/30/2021 1315   HGBUR MODERATE (A) 03/30/2021 1315   BILIRUBINUR NEGATIVE 03/30/2021 1315   [place] 03/30/2021 1315   PROTEINUR >=300 (A) 03/30/2021 1315   NITRITE NEGATIVE 03/30/2021 1315   LEUKOCYTESUR LARGE (A) 03/30/2021 1315    Radiological Exams on Admission: No results found.  EKG: Independently reviewed. Considerable baseline artifact w/likely NSR and RBBB.  Assessment/Plan Principal Problem:   UTI (urinary tract infection)  Karla Sanchez is a loquacious 81 yo F w/PMHx of diastolic CHF, Diabetes, Gout, recurrent  ESBL UTI who presents with generalized weakness.  Weakness/Physical Debility UTI Acute onset of weakness that has mostly resolved by the time of my evaluation w/o receiving antibiotics. Turbid U/A w/significant leuk esterase and Hgb, nitrite negative w/o dysuria. Given age, comorbidities, and clinical history, reasonable to admit for further evaluation. 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 Not on allopurinol as she claims it did not work - given significant symptoms, will assess with foot XR  Systolic murmur Suspect aortic sclerosis rather than stenosis, but will clarify further as no prior TTE available for review in the setting of weakness of unclear etiology. - TTE pending  Diabetes Patient is unclear about her home regimen and states she takes 60u nightly, though records suggest much less.  - SSI, BG QAC/HS - will start with 15u detemir and adjust regimen based on AM sugars - A1c pending  DVT prophylaxis: lovenox  Code Status:   DNR Family Communication:  NOK is Karla Sanchez, children live in other areas and are often traveling.  Disposition Plan:  Patient is from:  Home/hotel Anticipated DC to:  Home/hotel Anticipated DC date:  1/10 Anticipated DC barriers: Physical debility  Consults called:  N/A   Admission status:  obs   Severity of Illness: The appropriate patient status for this patient is OBSERVATION. Observation status is judged to be reasonable and necessary in order to provide the required intensity of service to ensure the patient's safety. The patient's presenting symptoms, physical exam findings, and initial radiographic and laboratory data in the context of their medical condition is felt to place them at decreased  risk for further clinical deterioration. Furthermore, it is anticipated that the patient will be medically stable for discharge from the hospital within 2 midnights of admission.    Cecille Rubin MD [facility]  How to contact the [facility] Attending or Consulting provider [place] or covering provider during after hours [place], for this patient?  Check the care team in [facility] and look for a) attending/consulting [facility] provider listed and b) the [facility] team listed Log into [email or web address] and use [facility] universal password to access. If you do not have the password, please contact the hospital operator. Locate the [facility] provider you are looking for under [facility] and page to a number that you can be directly reached. If you still have difficulty reaching the provider, please page the [facility] (Director on Call) for the Hospitalists listed on amion for assistance.  03/30/2021, 3:50 PM

## 2021-04-01 LAB — BLOOD CULTURE ID PANEL (REFLEXED) - BCID2

## 2021-04-01 LAB — URINE CULTURE: Culture: >=100000 — AB

## 2021-04-04 ENCOUNTER — Encounter: Payer: Self-pay | Admitting: Internal Medicine

## 2021-04-04 LAB — CULTURE, BLOOD (ROUTINE X 2)
Culture: NO GROWTH
Special Requests: ADEQUATE
Special Requests: ADEQUATE

## 2021-05-01 ENCOUNTER — Encounter: Payer: Self-pay | Admitting: Emergency Medicine

## 2021-05-01 ENCOUNTER — Emergency Department: Payer: Medicare Other

## 2021-05-01 ENCOUNTER — Other Ambulatory Visit: Payer: Self-pay

## 2021-05-01 ENCOUNTER — Inpatient Hospital Stay
Admission: EM | Admit: 2021-05-01 | Discharge: 2021-05-12 | DRG: 177 | Disposition: A | Discharge status: Skilled Nursing Facility | Payer: Medicare Other | Attending: Internal Medicine | Admitting: Internal Medicine

## 2021-05-01 DIAGNOSIS — I440 Atrioventricular block, first degree: Secondary | ICD-10-CM | POA: Diagnosis present

## 2021-05-01 DIAGNOSIS — N390 Urinary tract infection, site not specified: Secondary | ICD-10-CM

## 2021-05-01 DIAGNOSIS — Z66 Do not resuscitate: Secondary | ICD-10-CM | POA: Diagnosis present

## 2021-05-01 DIAGNOSIS — I452 Bifascicular block: Secondary | ICD-10-CM | POA: Diagnosis present

## 2021-05-01 DIAGNOSIS — B372 Candidiasis of skin and nail: Secondary | ICD-10-CM | POA: Diagnosis present

## 2021-05-01 DIAGNOSIS — Z6841 Body Mass Index (BMI) 40.0 and over, adult: Secondary | ICD-10-CM

## 2021-05-01 DIAGNOSIS — U071 COVID-19: Secondary | ICD-10-CM

## 2021-05-01 DIAGNOSIS — A0839 Other viral enteritis: Secondary | ICD-10-CM

## 2021-05-01 DIAGNOSIS — E1122 Type 2 diabetes mellitus with diabetic chronic kidney disease: Secondary | ICD-10-CM | POA: Diagnosis present

## 2021-05-01 DIAGNOSIS — I130 Hypertensive heart and chronic kidney disease with heart failure and stage 1 through stage 4 chronic kidney disease, or unspecified chronic kidney disease: Secondary | ICD-10-CM | POA: Diagnosis present

## 2021-05-01 DIAGNOSIS — E66813 Obesity, class 3: Secondary | ICD-10-CM | POA: Diagnosis present

## 2021-05-01 DIAGNOSIS — N1831 Chronic kidney disease, stage 3a: Secondary | ICD-10-CM | POA: Diagnosis present

## 2021-05-01 DIAGNOSIS — D649 Anemia, unspecified: Secondary | ICD-10-CM | POA: Diagnosis present

## 2021-05-01 DIAGNOSIS — Z85038 Personal history of other malignant neoplasm of large intestine: Secondary | ICD-10-CM | POA: Diagnosis not present

## 2021-05-01 DIAGNOSIS — Z794 Long term (current) use of insulin: Secondary | ICD-10-CM

## 2021-05-01 DIAGNOSIS — J449 Chronic obstructive pulmonary disease, unspecified: Secondary | ICD-10-CM | POA: Diagnosis present

## 2021-05-01 DIAGNOSIS — E1165 Type 2 diabetes mellitus with hyperglycemia: Secondary | ICD-10-CM | POA: Diagnosis present

## 2021-05-01 DIAGNOSIS — I10 Essential (primary) hypertension: Secondary | ICD-10-CM | POA: Diagnosis present

## 2021-05-01 DIAGNOSIS — Z9049 Acquired absence of other specified parts of digestive tract: Secondary | ICD-10-CM

## 2021-05-01 DIAGNOSIS — I5031 Acute diastolic (congestive) heart failure: Secondary | ICD-10-CM | POA: Diagnosis not present

## 2021-05-01 DIAGNOSIS — Z801 Family history of malignant neoplasm of trachea, bronchus and lung: Secondary | ICD-10-CM

## 2021-05-01 DIAGNOSIS — K5900 Constipation, unspecified: Secondary | ICD-10-CM | POA: Diagnosis present

## 2021-05-01 DIAGNOSIS — M109 Gout, unspecified: Secondary | ICD-10-CM | POA: Diagnosis present

## 2021-05-01 DIAGNOSIS — E119 Type 2 diabetes mellitus without complications: Secondary | ICD-10-CM

## 2021-05-01 DIAGNOSIS — R5381 Other malaise: Secondary | ICD-10-CM | POA: Diagnosis not present

## 2021-05-01 DIAGNOSIS — J9621 Acute and chronic respiratory failure with hypoxia: Secondary | ICD-10-CM | POA: Diagnosis present

## 2021-05-01 DIAGNOSIS — E669 Obesity, unspecified: Secondary | ICD-10-CM | POA: Diagnosis present

## 2021-05-01 DIAGNOSIS — J9611 Chronic respiratory failure with hypoxia: Secondary | ICD-10-CM | POA: Diagnosis present

## 2021-05-01 DIAGNOSIS — Z79899 Other long term (current) drug therapy: Secondary | ICD-10-CM | POA: Diagnosis not present

## 2021-05-01 DIAGNOSIS — I5033 Acute on chronic diastolic (congestive) heart failure: Secondary | ICD-10-CM | POA: Diagnosis present

## 2021-05-01 DIAGNOSIS — I509 Heart failure, unspecified: Secondary | ICD-10-CM

## 2021-05-01 DIAGNOSIS — N3000 Acute cystitis without hematuria: Secondary | ICD-10-CM

## 2021-05-01 LAB — URINALYSIS, ROUTINE W REFLEX MICROSCOPIC
Bilirubin Urine: NEGATIVE
Glucose, UA: 150 mg/dL — AB
Ketones, ur: NEGATIVE mg/dL
Nitrite: NEGATIVE
Protein, ur: >=300 mg/dL — AB
Specific Gravity, Urine: 1.018 (ref 1.005–1.030)
pH: 5 (ref 5.0–8.0)

## 2021-05-01 LAB — CBC
HCT: 37.1 % (ref 36.0–46.0)
Hemoglobin: 11.5 g/dL — ABNORMAL LOW (ref 12.0–15.0)
MCH: 26.5 pg (ref 26.0–34.0)
MCHC: 31 g/dL (ref 30.0–36.0)
MCV: 85.5 fL (ref 80.0–100.0)
Platelets: 304 10*3/uL (ref 150–400)
RBC: 4.34 MIL/uL (ref 3.87–5.11)
RDW: 16 % — ABNORMAL HIGH (ref 11.5–15.5)
WBC: 6.6 10*3/uL (ref 4.0–10.5)
nRBC: 0 % (ref 0.0–0.2)

## 2021-05-01 LAB — COMPREHENSIVE METABOLIC PANEL
ALT: 18 U/L (ref 0–44)
AST: 22 U/L (ref 15–41)
Albumin: 2.6 g/dL — ABNORMAL LOW (ref 3.5–5.0)
Alkaline Phosphatase: 87 U/L (ref 38–126)
Anion gap: 9 (ref 5–15)
BUN: 47 mg/dL — ABNORMAL HIGH (ref 8–23)
CO2: 25 mmol/L (ref 22–32)
Calcium: 7.7 mg/dL — ABNORMAL LOW (ref 8.9–10.3)
Chloride: 103 mmol/L (ref 98–111)
Creatinine, Ser: 1.44 mg/dL — ABNORMAL HIGH (ref 0.44–1.00)
GFR, Estimated: 37 mL/min — ABNORMAL LOW (ref >60)
Glucose, Bld: 372 mg/dL — ABNORMAL HIGH (ref 70–99)
Potassium: 4.9 mmol/L (ref 3.5–5.1)
Sodium: 137 mmol/L (ref 135–145)
Total Bilirubin: 0.3 mg/dL (ref 0.3–1.2)
Total Protein: 5.8 g/dL — ABNORMAL LOW (ref 6.5–8.1)

## 2021-05-01 LAB — CBG MONITORING, ED: Glucose-Capillary: 219 mg/dL — ABNORMAL HIGH (ref 70–99)

## 2021-05-01 LAB — LIPASE, BLOOD: Lipase: 64 U/L — ABNORMAL HIGH (ref 11–51)

## 2021-05-01 LAB — BRAIN NATRIURETIC PEPTIDE: B Natriuretic Peptide: 241 pg/mL — ABNORMAL HIGH (ref 0.0–100.0)

## 2021-05-01 LAB — TROPONIN I (HIGH SENSITIVITY)
Troponin I (High Sensitivity): 11 ng/L (ref <18)
Troponin I (High Sensitivity): 10 ng/L (ref <18)

## 2021-05-01 MED ORDER — ACETAMINOPHEN 325 MG PO TABS
650.0000 mg | ORAL_TABLET | Freq: Four times a day (QID) | ORAL | Status: DC | PRN
  Administered 2021-05-12: 650 mg via ORAL
  Filled 2021-05-01: qty 2

## 2021-05-01 MED ORDER — ONDANSETRON HCL 4 MG PO TABS: 4.0000 mg | ORAL_TABLET | Freq: Four times a day (QID) | ORAL | Status: DC | PRN

## 2021-05-01 MED ORDER — POLYETHYLENE GLYCOL 3350 17 G PO PACK: 17.0000 g | PACK | Freq: Every day | ORAL | Status: DC | PRN

## 2021-05-01 MED ORDER — TRAZODONE HCL 50 MG PO TABS
25.0000 mg | ORAL_TABLET | Freq: Every evening | ORAL | Status: DC | PRN
  Administered 2021-05-05 – 2021-05-06 (×2): 25 mg via ORAL
  Filled 2021-05-01 (×2): qty 1

## 2021-05-01 MED ORDER — FUROSEMIDE 10 MG/ML IJ SOLN
40.0000 mg | Freq: Two times a day (BID) | INTRAMUSCULAR | Status: DC
  Administered 2021-05-02 – 2021-05-06 (×9): 40 mg via INTRAVENOUS
  Filled 2021-05-01 (×8): qty 4

## 2021-05-01 MED ORDER — FUROSEMIDE 10 MG/ML IJ SOLN
40.0000 mg | Freq: Once | INTRAMUSCULAR | Status: AC
  Administered 2021-05-01: 40 mg via INTRAVENOUS
  Filled 2021-05-01: qty 4

## 2021-05-01 MED ORDER — ONDANSETRON HCL 4 MG/2ML IJ SOLN: 4.0000 mg | Freq: Four times a day (QID) | INTRAMUSCULAR | Status: DC | PRN

## 2021-05-01 MED ORDER — ENOXAPARIN SODIUM 60 MG/0.6ML IJ SOSY
0.5000 mg/kg | PREFILLED_SYRINGE | INTRAMUSCULAR | Status: DC
  Administered 2021-05-01 – 2021-05-09 (×4): 57.5 mg via SUBCUTANEOUS
  Filled 2021-05-01 (×6): qty 0.6

## 2021-05-01 MED ORDER — ACETAMINOPHEN 650 MG RE SUPP: 650.0000 mg | Freq: Four times a day (QID) | RECTAL | Status: DC | PRN

## 2021-05-01 NOTE — ED Notes (Signed)
Pt provided po food and liquid per request, as okay'd by MD. Pt repositioned and barrier cream applied to thighs and groin area.

## 2021-05-01 NOTE — ED Triage Notes (Signed)
Pt comes into the ED via POV c/o RLQ abd pain and diarrhea.  Pt had to be assisted out of her car due 3 fecal episodes while driving here.  Pt states she was discharged from [place] this morning.  PT present with feces running down her legs and full stools in her under wear.  Pt has even and unlabored respirations at this time. Pt unaware of what she was diagnosed with at [place].

## 2021-05-01 NOTE — H&P (Addendum)
[place]   PATIENT NAME: Karla Sanchez    MR#:  [id number]  DATE OF BIRTH:  1940/06/12  DATE OF ADMISSION:  05/01/2021  PRIMARY CARE PHYSICIAN: Alfredia Client, MD   Patient is coming from: Home  REQUESTING/REFERRING PHYSICIAN: Loraine Leriche, MD  CHIEF COMPLAINT:   Chief Complaint  Patient presents with   Diarrhea   Abdominal Pain  -Shortness of breath  HISTORY OF PRESENT ILLNESS:  Karla Sanchez is a 81 y.o. female with medical history significant for HFpEF, colon cancer status post left colectomy, COPD, type 2 diabetes mellitus, diastolic heart failure, gout and hypertension, who presented to the ER with acute onset of diarrhea and associated abdominal pain.  She denied any bright red bleeding per rectum or melena.  She admits to dyspnea with associated cough productive of yellow sputum over the last couple weeks as well as worsening lower extremity edema.  No chest pain or palpitations.  She has been having urinary frequency and urgency with no dysuria, oliguria or hematuria or flank pain.  She was admitted with COVID-19 from 2/3 to 2/6 at [facility] in [place].  She received 3 days of steroids and remdesivir.  She was readmitted again to [place] and discharged on 2/8 where she had IV diuresis.  She had chest CTA that was negative for PE at [facility] and that showed several small pleural effusion and abdominal and pelvic CT scan that was negative except for low control abdominal hernia.  It showed her prior hemicolectomy changes.  She was sent home from the ER at [facility] at 9:30 AM on 2/9.  ED Course: When she presented to the emergency room here BP was 156/51 with otherwise normal vital signs.  Labs reveal still positive COVID-19 PCR with negative influenza antigens and CMP revealed a blood glucose of 372, BUN of 47 with a creatinine of 1.44 up from 28/1.26 on 1/8 with a lipase of 64 and total protein  5.8 and albumin 2.6.  BNP was 241 and high-sensitivity  troponin I was 11 R10.  CBC showed mild anemia that is stable.  UA was suspicious for UTI with 11-20 WBCs with rare bacteria.  Showed more than 300 protein and trace leukocytes as well as 150 glucose.stool pathogens and C. difficile was sent.  EKG as reviewed by me : EKG showed normal sinus rhythm with a rate of 83 with prolonged PR interval, right bundle branch block and left anterior fascicular block with LVH by voltage criteria. Imaging: Portable chest x-ray showed: 1. Mild bronchial thickening with coarse interstitial lung markings, representing bronchitis and/or pulmonary vascular congestion. No focal consolidation or pleural effusion.   2.  Atherosclerotic calcification of aortic arch.  The patient was given 40 mg of IV Lasix.  She will be admitted to a cardiac telemetry bed for further evaluation and management.  PAST MEDICAL HISTORY:   Past Medical History:  Diagnosis Date   CHF (congestive heart failure) ([place])    Colon cancer ([place])    s/p right and left colectomy 2020   COPD (chronic obstructive pulmonary disease) (H[facility])    Diabetes mellitus without complication (H[facility])    Diastolic heart failure ([facility])    Gout    Hypertension     PAST SURGICAL HISTORY:   Past Surgical History:  Procedure Laterality Date   COLON SURGERY     TUBAL LIGATION  1970    SOCIAL HISTORY:   Social History   Tobacco Use   Smoking status:  Never   Smokeless tobacco: Never  Substance Use Topics   Alcohol use: Not Currently    FAMILY HISTORY:   Family History  Problem Relation Age of Onset   Lung cancer Father     DRUG ALLERGIES:   Allergies  Allergen Reactions   Codeine Nausea Only, Other (See Comments) and Swelling    Other Reaction: nausea and vomiting Other reaction(s): NAUSEA    Other Other (See Comments)    Other reaction(s): SWELLING  Other reaction(s): MUSCLE PAIN Muscle aches Other reaction(s): MUSCLE PAIN Muscle aches Other reaction(s): MUSCLE PAIN Muscle aches     Exenatide Nausea And Vomiting   Levofloxacin Other (See Comments)    Other Reaction: sloughing of buccal mucosa Other reaction(s): OTHER Lost skin in mouth Patient states she can take Cipro without difficulty and has taken it many times in the past Other Reaction: sloughing of buccal mucosa    Losartan Other (See Comments)    Weight gain Weight gain    Tetracyclines & Related Other (See Comments)    REVIEW OF SYSTEMS:   ROS As per history of present illness. All pertinent systems were reviewed above. Constitutional, HEENT, cardiovascular, respiratory, GI, GU, musculoskeletal, neuro, psychiatric, endocrine, integumentary and hematologic systems were reviewed and are otherwise negative/unremarkable except for positive findings mentioned above in the HPI.   MEDICATIONS AT HOME:   Prior to Admission medications   Medication Sig Start Date End Date Taking? Authorizing Provider  furosemide (LASIX) 40 MG tablet Take by mouth. 04/30/21 05/30/21 Yes [provider]  metoprolol tartrate (LOPRESSOR) 25 MG tablet Take 0.5 tablets by mouth in the morning and at bedtime. 04/22/21 04/22/22 Yes [provider]  traZODone (DESYREL) 100 MG tablet Take 1 tablet by mouth at bedtime as needed. 02/28/21 05/29/21 Yes [provider]  acetaminophen (TYLENOL) 325 MG tablet Take 650 mg by mouth every 6 (six) hours as needed for mild pain or fever.     [provider]  albuterol (VENTOLIN HFA) 108 (90 Base) MCG/ACT inhaler Inhale 2 puffs into the lungs every 6 (six) hours as needed for wheezing or shortness of breath. 01/09/20   Loletha Grayer, MD  allopurinol (ZYLOPRIM) 300 MG tablet Take 300 mg by mouth daily. 03/04/21   [provider]  AMBIEN 5 MG tablet Take 5 mg by mouth at bedtime as needed. 03/28/21   [provider]  amLODipine (NORVASC) 10 MG tablet Take 10 mg by mouth daily. 02/18/21   [provider]  aspirin EC 81 MG tablet Take 1 tablet  (81 mg total) by mouth daily. Swallow whole. Patient not taking: No sig reported 01/09/20   Loletha Grayer, MD  atorvastatin (LIPITOR) 20 MG tablet Take 1 tablet (20 mg total) by mouth daily. Patient not taking: Reported on 08/28/2020 08/16/20   Kathie Dike, MD  cetirizine (ZYRTEC) 10 MG tablet Take 10 mg by mouth daily. 03/04/21   [provider]  CLARITIN 10 MG tablet Take by mouth. 03/04/21   [provider]  colchicine 0.6 MG tablet Take 1 tablet (0.6 mg total) by mouth daily as needed (acute gout flare). 06/13/20   Fritzi Mandes, MD  dapagliflozin propanediol (FARXIGA) 10 MG TABS tablet Take 1 tablet (10 mg total) by mouth daily. Patient not taking: Reported on 08/28/2020 08/17/20   Kathie Dike, MD  dexamethasone (DECADRON) 4 MG tablet Take by mouth. 04/29/21   [provider]  diphenhydrAMINE (BENADRYL) 25 MG tablet Take 25 mg by mouth every 6 (six) hours  as needed. 03/04/21   [provider]  docusate sodium (COLACE) 100 MG capsule Take 1 capsule (100 mg total) by mouth at bedtime. 01/09/20   Loletha Grayer, MD  ferrous sulfate 325 (65 FE) MG tablet Take 1 tablet (325 mg total) by mouth daily. Patient not taking: No sig reported 01/09/20   Loletha Grayer, MD  [facility] ALLERGY RELIEF 50 MCG/ACT nasal spray SMARTSIG:1 Both Nares Daily 03/04/21   [provider]  furosemide (LASIX) 40 MG tablet Take 80mg  po qam and 40mg  po qpm 08/16/20   Kathie Dike, MD  gentamicin (GARAMYCIN) 0.3 % ophthalmic solution 2 drops 3 (three) times daily. 03/04/21   [provider]  GNP ANTI-GAS 180 MG CAPS Take 1 capsule by mouth every 6 (six) hours as needed. 03/04/21   [provider]  GNP IRON 200 (65 Fe) MG TABS Take 1 tablet by mouth 3 (three) times daily. 03/04/21   [provider]  HUMALOG 100 UNIT/ML injection SMARTSIG:3 Unit(s) SUB-Q 03/28/21   [provider]  HUMALOG KWIKPEN 100 UNIT/ML KwikPen SMARTSIG:Unit(s) SUB-Q 3  Times Daily 03/04/21   [provider]  hydrALAZINE (APRESOLINE) 25 MG tablet Take 1 tablet (25 mg total) by mouth every 8 (eight) hours. Patient not taking: Reported on 08/28/2020 08/16/20   Kathie Dike, MD  Insulin Pen Needle 31G X 8 MM MISC 1 Dose by Does not apply route 3 (three) times daily before meals. 08/28/20   Blake Divine, MD  LANTUS SOLOSTAR 100 UNIT/ML Solostar Pen Inject 10 Units into the skin at bedtime. 03/04/21   [provider]  LEVEMIR 100 UNIT/ML injection Inject 0.15 mLs (15 Units total) into the skin daily. 08/28/20   Blake Divine, MD  LEVEMIR FLEXTOUCH 100 UNIT/ML FlexTouch Pen Inject into the skin. 02/28/21   [provider]  lidocaine (LIDODERM) 5 % 2 patches daily. 02/18/21   [provider]  liver oil-zinc oxide (DESITIN) 40 % ointment Apply 1 application topically in the morning and at bedtime. Uses on buttock 01/09/20   Loletha Grayer, MD  magnesium oxide (MAG-OX) 400 MG tablet Take 1 tablet by mouth every morning. 03/04/21   [provider]  metoprolol succinate (TOPROL-XL) 25 MG 24 hr tablet Take 25 mg by mouth every morning. 03/04/21   [provider]  metoprolol tartrate (LOPRESSOR) 25 MG tablet Take 1 tablet (25 mg total) by mouth 2 (two) times daily. 06/13/20   Fritzi Mandes, MD  [facility] 17 GM/SCOOP powder Take by mouth. 03/04/21   [provider]  nitrofurantoin, macrocrystal-monohydrate, (MACROBID) 100 MG capsule Take 100 mg by mouth 2 (two) times daily. 04/25/21   [provider]  nystatin (MYCOSTATIN/NYSTOP) powder Apply topically 2 (two) times daily. Patient not taking: Reported on 08/28/2020 01/09/20   Loletha Grayer, MD  potassium chloride (KLOR-CON) 10 MEQ tablet Take 1 tablet (10 mEq total) by mouth daily. Patient not taking: Reported on 08/28/2020 06/13/20 06/13/21  Fritzi Mandes, MD  senna (SENOKOT) 8.6 MG TABS tablet SMARTSIG:2 Tablet(s) By Mouth Morning-Night 03/04/21   [provider]  torsemide (DEMADEX) 20 MG tablet Take by mouth 2 (two) times daily. 02/18/21   [provider]  traZODone (DESYREL) 50 MG tablet Take 1 tablet (50 mg total) by mouth at bedtime as needed for sleep. 01/09/20   Wieting, Richard, MD  TRELEGY ELLIPTA 100-62.5-25 MCG/ACT AEPB Inhale 1 puff into the lungs daily. 03/28/21   [provider]  triamcinolone cream (KENALOG) 0.1 % Apply 1 application topically 2 (  two) times daily. 03/04/21   [provider]  ULORIC 40 MG tablet Take 20 mg by mouth daily. 03/04/21   [provider]  VOLTAREN 1 % GEL SMARTSIG:Sparingly Topical 4 Times Daily 03/28/21   [provider]  ZOLOFT 25 MG tablet Take 25 mg by mouth daily. 03/04/21   [provider]      VITAL SIGNS:  Blood pressure (!) 138/54, pulse 69, temperature 98.1 F (36.7 C), temperature source Oral, resp. rate 17, height 5\' 6"  (1.676 m), weight 113.7 kg, SpO2 94 %.  PHYSICAL EXAMINATION:  Physical Exam  GENERAL:  81 y.o.-year-old Caucasian female patient lying in the bed with mild respiratory distress with conversational dyspnea.  EYES: Pupils equal, round, reactive to light and accommodation. No scleral icterus. Extraocular muscles intact.  HEENT: Head atraumatic, normocephalic. Oropharynx and nasopharynx clear.  NECK:  Supple, no jugular venous distention. No thyroid enlargement, no tenderness.  LUNGS: Slightly diminished bibasilar breath sounds with bibasilar rales.  No use of accessory muscles of respiration.  CARDIOVASCULAR: Regular rate and rhythm, S1, S2 normal. No murmurs, rubs, or gallops.  ABDOMEN: Soft, nondistended, nontender. Bowel sounds present. No organomegaly or mass.  EXTREMITIES: 2+ bilateral lower extremity pitting edema with no cyanosis, or clubbing.  NEUROLOGIC: Cranial nerves II through XII are intact. Muscle strength 5/5 in all extremities. Sensation intact. Gait not checked.  PSYCHIATRIC: The patient is alert and  oriented x 3.  Normal affect and good eye contact. SKIN: No obvious rash, lesion, or ulcer.   LABORATORY PANEL:   CBC Recent Labs  Lab 05/01/21 1701  WBC 6.6  HGB 11.5*  HCT 37.1  PLT 304   ------------------------------------------------------------------------------------------------------------------  Chemistries  Recent Labs  Lab 05/01/21 1701  NA 137  K 4.9  CL 103  CO2 25  GLUCOSE 372*  BUN 47*  CREATININE 1.44*  CALCIUM 7.7*  AST 22  ALT 18  ALKPHOS 87  BILITOT 0.3   ------------------------------------------------------------------------------------------------------------------  Cardiac Enzymes No results for input(s): TROPONINI in the last 168 hours. ------------------------------------------------------------------------------------------------------------------  RADIOLOGY:  DG Chest Portable 1 View  Result Date: 05/01/2021 CLINICAL DATA:  Shortness of breath, right lower quadrant abdominal pain and diarrhea. EXAM: PORTABLE CHEST 1 VIEW COMPARISON:  Chest radiograph dated August 25, 2020. FINDINGS: The heart size and mediastinal contours are within normal limits. Atherosclerotic calcification of aortic arch. Coarse interstitial markings prominent in the bilateral upper lobes concerning for mild fibrosis. Prominence of the bronchial markings concerning for bronchitis. No focal consolidation or large pleural effusion. Degenerative changes of the thoracic spine and bilateral glenohumeral osteoarthritis. IMPRESSION: 1. Mild bronchial thickening with coarse interstitial lung markings, representing bronchitis and/or pulmonary vascular congestion. No focal consolidation or pleural effusion. 2.  Atherosclerotic calcification of aortic arch. Electronically Signed   By: Keane Police D.O.   On: 05/01/2021 16:19      IMPRESSION AND PLAN:  Active Problems:   Acute CHF (congestive heart failure) ([place])  1.  Acute on chronic HFpEF. - The patient will be admitted to a cardiac  telemetry bed. - We will continue diuresis with IV Lasix. - We will follow serial troponins. - She had a 2D echo last year with EF of 55 to 60% with grade 1 diastolic dysfunction, mild mitral valve regurgitation and mild left atrial dilatation.  2.  Diarrhea with abdominal pain and recent negative abdominal CT scan for acute findings. - We will await results for stool pathogens and C. difficile sent by the ER physician.  3.  Recent COVID-19.. - We will continue isolation for 3 more days. - I believe her respiratory symptoms are more related to her CHF. - We will add mucolytic therapy for her productive cough.  4.  Suspected UTI. - This will be covered with IV Rocephin and will follow urine culture and sensitivity.  5.  Essential hypertension. - We will continue Norvasc and Lopressor.  6.  COPD. - We will continue his albuterol and hold off Trelegy.  7.  Type 2 diabetes mellitus. -Will place on supplement coverage with NovoLog.  DVT prophylaxis: Lovenox. Advanced Care Planning:  Code Status: Patient is DNR/DNI. Family Communication:  The plan of care was discussed in details with the patient (and family). I answered all questions. The patient agreed to proceed with the above mentioned plan. Further management will depend upon hospital course. Disposition Plan: Back to previous home environment Consults called: none. All the records are reviewed and case discussed with ED provider.  Status is: Inpatient  I certify that at the time of admission, it is my clinical judgment that the patient will require inpatient hospital care extending more than 2 midnights.                            Dispo: The patient is from: Home              Anticipated d/c is to: Home              Patient currently is not medically stable to d/c.              Difficult to place patient: No  Christel Mormon M.D on 05/01/2021 at 8:37 PM  [facility]   From 7 PM-7 AM, contact  night-coverage [email or web address]  CC: Primary care physician; Alfredia Client, MD

## 2021-05-01 NOTE — H&P (Incomplete)
[place]   PATIENT NAME: Karla Sanchez    MR#:  [id number]  DATE OF BIRTH:  December 13, 1940  DATE OF ADMISSION:  05/01/2021  PRIMARY CARE PHYSICIAN: Alfredia Client, MD   Patient is coming from: ***  REQUESTING/REFERRING PHYSICIAN: ***  CHIEF COMPLAINT:   Chief Complaint  Patient presents with   Diarrhea   Abdominal Pain    HISTORY OF PRESENT ILLNESS:  Karla Sanchez is a 81 y.o. female with medical history significant for ***  ED Course: *** EKG as reviewed by me : *** Imaging: *** PAST MEDICAL HISTORY:   Past Medical History:  Diagnosis Date   CHF (congestive heart failure) (HCC)    Colon cancer (HCC)    s/p right and left colectomy 2020   COPD (chronic obstructive pulmonary disease) ([place])    Diabetes mellitus without complication (HCC)    Diastolic heart failure (HCC)    Gout    Hypertension     PAST SURGICAL HISTORY:   Past Surgical History:  Procedure Laterality Date   COLON SURGERY     TUBAL LIGATION  1970    SOCIAL HISTORY:   Social History   Tobacco Use   Smoking status: Never   Smokeless tobacco: Never  Substance Use Topics   Alcohol use: Not Currently    FAMILY HISTORY:   Family History  Problem Relation Age of Onset   Lung cancer Father     DRUG ALLERGIES:   Allergies  Allergen Reactions   Codeine Nausea Only, Other (See Comments) and Swelling    Other Reaction: nausea and vomiting Other reaction(s): NAUSEA    Other Other (See Comments)    Other reaction(s): SWELLING  Other reaction(s): MUSCLE PAIN Muscle aches Other reaction(s): MUSCLE PAIN Muscle aches Other reaction(s): MUSCLE PAIN Muscle aches    Exenatide Nausea And Vomiting   Levofloxacin Other (See Comments)    Other Reaction: sloughing of buccal mucosa Other reaction(s): OTHER Lost skin in mouth Patient states she can take Cipro without difficulty and has taken it many times in the past Other Reaction: sloughing of buccal mucosa     Losartan Other (See Comments)    Weight gain Weight gain    Tetracyclines & Related Other (See Comments)    REVIEW OF SYSTEMS:   ROS As per history of present illness. All pertinent systems were reviewed above. Constitutional, HEENT, cardiovascular, respiratory, GI, GU, musculoskeletal, neuro, psychiatric, endocrine, integumentary and hematologic systems were reviewed and are otherwise negative/unremarkable except for positive findings mentioned above in the HPI.   MEDICATIONS AT HOME:   Prior to Admission medications   Medication Sig Start Date End Date Taking? Authorizing Provider  furosemide (LASIX) 40 MG tablet Take by mouth. 04/30/21 05/30/21 Yes [provider]  metoprolol tartrate (LOPRESSOR) 25 MG tablet Take 0.5 tablets by mouth in the morning and at bedtime. 04/22/21 04/22/22 Yes [provider]  traZODone (DESYREL) 100 MG tablet Take 1 tablet by mouth at bedtime as needed. 02/28/21 05/29/21 Yes [provider]  acetaminophen (TYLENOL) 325 MG tablet Take 650 mg by mouth every 6 (six) hours as needed for mild pain or fever.     [provider]  albuterol (VENTOLIN HFA) 108 (90 Base) MCG/ACT inhaler Inhale 2 puffs into the lungs every 6 (six) hours as needed for wheezing or shortness of breath. 01/09/20   Loletha Grayer, MD  allopurinol (ZYLOPRIM) 300 MG tablet Take 300 mg by mouth daily. 03/04/21   [provider]  AMBIEN 5 MG tablet Take 5 mg by mouth at bedtime as needed. 03/28/21   [provider]  amLODipine (NORVASC) 10 MG tablet Take 10 mg by mouth daily. 02/18/21   [provider]  aspirin EC 81 MG tablet Take 1 tablet (81 mg total) by mouth daily. Swallow whole. Patient not taking: No sig reported 01/09/20   Loletha Grayer, MD  atorvastatin (LIPITOR) 20 MG tablet Take 1 tablet (20 mg total) by mouth daily. Patient not taking: Reported on 08/28/2020 08/16/20   Kathie Dike, MD  cetirizine (ZYRTEC) 10 MG tablet  Take 10 mg by mouth daily. 03/04/21   [provider]  CLARITIN 10 MG tablet Take by mouth. 03/04/21   [provider]  colchicine 0.6 MG tablet Take 1 tablet (0.6 mg total) by mouth daily as needed (acute gout flare). 06/13/20   Fritzi Mandes, MD  dapagliflozin propanediol (FARXIGA) 10 MG TABS tablet Take 1 tablet (10 mg total) by mouth daily. Patient not taking: Reported on 08/28/2020 08/17/20   Kathie Dike, MD  dexamethasone (DECADRON) 4 MG tablet Take by mouth. 04/29/21   [provider]  diphenhydrAMINE (BENADRYL) 25 MG tablet Take 25 mg by mouth every 6 (six) hours as needed. 03/04/21   [provider]  docusate sodium (COLACE) 100 MG capsule Take 1 capsule (100 mg total) by mouth at bedtime. 01/09/20   Loletha Grayer, MD  ferrous sulfate 325 (65 FE) MG tablet Take 1 tablet (325 mg total) by mouth daily. Patient not taking: No sig reported 01/09/20   Loletha Grayer, MD  [facility] ALLERGY RELIEF 50 MCG/ACT nasal spray SMARTSIG:1 Both Nares Daily 03/04/21   [provider]  furosemide (LASIX) 40 MG tablet Take 80mg  po qam and 40mg  po qpm 08/16/20   Kathie Dike, MD  gentamicin (GARAMYCIN) 0.3 % ophthalmic solution 2 drops 3 (three) times daily. 03/04/21   [provider]  GNP ANTI-GAS 180 MG CAPS Take 1 capsule by mouth every 6 (six) hours as needed. 03/04/21   [provider]  GNP IRON 200 (65 Fe) MG TABS Take 1 tablet by mouth 3 (three) times daily. 03/04/21   [provider]  HUMALOG 100 UNIT/ML injection SMARTSIG:3 Unit(s) SUB-Q 03/28/21   [provider]  HUMALOG KWIKPEN 100 UNIT/ML KwikPen SMARTSIG:Unit(s) SUB-Q 3 Times Daily 03/04/21   [provider]  hydrALAZINE (APRESOLINE) 25 MG tablet Take 1 tablet (25 mg total) by mouth every 8 (eight) hours. Patient not taking: Reported on 08/28/2020 08/16/20   Kathie Dike, MD  Insulin Pen Needle 31G X 8 MM MISC 1 Dose by Does not apply route 3 (three) times  daily before meals. 08/28/20   Blake Divine, MD  LANTUS SOLOSTAR 100 UNIT/ML Solostar Pen Inject 10 Units into the skin at bedtime. 03/04/21   [provider]  LEVEMIR 100 UNIT/ML injection Inject 0.15 mLs (15 Units total) into the skin daily. 08/28/20   Blake Divine, MD  LEVEMIR FLEXTOUCH 100 UNIT/ML FlexTouch Pen Inject into the skin. 02/28/21   [provider]  lidocaine (LIDODERM) 5 % 2 patches daily. 02/18/21   [provider]  liver oil-zinc oxide (DESITIN) 40 % ointment Apply 1 application topically in the morning and at bedtime. Uses on buttock 01/09/20   Loletha Grayer, MD  magnesium oxide (MAG-OX) 400 MG tablet Take 1 tablet by mouth every morning. 03/04/21   [provider]  metoprolol succinate (TOPROL-XL) 25 MG 24 hr tablet Take 25 mg by mouth every morning. 03/04/21  [provider]  metoprolol tartrate (LOPRESSOR) 25 MG tablet Take 1 tablet (25 mg total) by mouth 2 (two) times daily. 06/13/20   Fritzi Mandes, MD  [facility] 17 GM/SCOOP powder Take by mouth. 03/04/21   [provider]  nitrofurantoin, macrocrystal-monohydrate, (MACROBID) 100 MG capsule Take 100 mg by mouth 2 (two) times daily. 04/25/21   [provider]  nystatin (MYCOSTATIN/NYSTOP) powder Apply topically 2 (two) times daily. Patient not taking: Reported on 08/28/2020 01/09/20   Loletha Grayer, MD  potassium chloride (KLOR-CON) 10 MEQ tablet Take 1 tablet (10 mEq total) by mouth daily. Patient not taking: Reported on 08/28/2020 06/13/20 06/13/21  Fritzi Mandes, MD  senna (SENOKOT) 8.6 MG TABS tablet SMARTSIG:2 Tablet(s) By Mouth Morning-Night 03/04/21   [provider]  torsemide (DEMADEX) 20 MG tablet Take by mouth 2 (two) times daily. 02/18/21   [provider]  traZODone (DESYREL) 50 MG tablet Take 1 tablet (50 mg total) by mouth at bedtime as needed for sleep. 01/09/20   Wieting, Richard, MD  TRELEGY ELLIPTA 100-62.5-25 MCG/ACT AEPB Inhale 1  puff into the lungs daily. 03/28/21   [provider]  triamcinolone cream (KENALOG) 0.1 % Apply 1 application topically 2 (two) times daily. 03/04/21   [provider]  ULORIC 40 MG tablet Take 20 mg by mouth daily. 03/04/21   [provider]  VOLTAREN 1 % GEL SMARTSIG:Sparingly Topical 4 Times Daily 03/28/21   [provider]  ZOLOFT 25 MG tablet Take 25 mg by mouth daily. 03/04/21   [provider]      VITAL SIGNS:  Blood pressure (!) 136/54, pulse 67, temperature 98.4 F (36.9 C), temperature source Oral, resp. rate 18, height 5\' 6"  (1.676 m), weight 113.7 kg, SpO2 93 %.  PHYSICAL EXAMINATION:  Physical Exam  GENERAL:  81 y.o.-year-old patient lying in the bed with no acute distress.  EYES: Pupils equal, round, reactive to light and accommodation. No scleral icterus. Extraocular muscles intact.  HEENT: Head atraumatic, normocephalic. Oropharynx and nasopharynx clear.  NECK:  Supple, no jugular venous distention. No thyroid enlargement, no tenderness.  LUNGS: Normal breath sounds bilaterally, no wheezing, rales,rhonchi or crepitation. No use of accessory muscles of respiration.  CARDIOVASCULAR: Regular rate and rhythm, S1, S2 normal. No murmurs, rubs, or gallops.  ABDOMEN: Soft, nondistended, nontender. Bowel sounds present. No organomegaly or mass.  EXTREMITIES: No pedal edema, cyanosis, or clubbing.  NEUROLOGIC: Cranial nerves II through XII are intact. Muscle strength 5/5 in all extremities. Sensation intact. Gait not checked.  PSYCHIATRIC: The patient is alert and oriented x 3.  Normal affect and good eye contact. SKIN: No obvious rash, lesion, or ulcer.   LABORATORY PANEL:   CBC Recent Labs  Lab 05/01/21 1701  WBC 6.6  HGB 11.5*  HCT 37.1  PLT 304   ------------------------------------------------------------------------------------------------------------------  Chemistries  Recent Labs  Lab 05/01/21 1701  NA 137  K 4.9   CL 103  CO2 25  GLUCOSE 372*  BUN 47*  CREATININE 1.44*  CALCIUM 7.7*  AST 22  ALT 18  ALKPHOS 87  BILITOT 0.3   ------------------------------------------------------------------------------------------------------------------  Cardiac Enzymes No results for input(s): TROPONINI in the last 168 hours. ------------------------------------------------------------------------------------------------------------------  RADIOLOGY:  DG Chest Portable 1 View  Result Date: 05/01/2021 CLINICAL DATA:  Shortness of breath, right lower quadrant abdominal pain and diarrhea. EXAM: PORTABLE CHEST 1 VIEW COMPARISON:  Chest radiograph dated August 25, 2020. FINDINGS: The heart size and mediastinal contours are within normal limits. Atherosclerotic calcification of  aortic arch. Coarse interstitial markings prominent in the bilateral upper lobes concerning for mild fibrosis. Prominence of the bronchial markings concerning for bronchitis. No focal consolidation or large pleural effusion. Degenerative changes of the thoracic spine and bilateral glenohumeral osteoarthritis. IMPRESSION: 1. Mild bronchial thickening with coarse interstitial lung markings, representing bronchitis and/or pulmonary vascular congestion. No focal consolidation or pleural effusion. 2.  Atherosclerotic calcification of aortic arch. Electronically Signed   By: Keane Police D.O.   On: 05/01/2021 16:19      IMPRESSION AND PLAN:  Active Problems:   Acute CHF (congestive heart failure) (HCC)    DVT prophylaxis: Lovenox***  Advanced Care Planning:  Code Status: full code***  Family Communication:  The plan of care was discussed in details with the patient (and family). I answered all questions. The patient agreed to proceed with the above mentioned plan. Further management will depend upon hospital course. Disposition Plan: Back to previous home environment Consults called: none***  All the records are reviewed and case discussed with  ED provider.  Status is: Inpatient {Inpatient:23812}           At the time of the admission, it appears that the appropriate admission status for this patient is inpatient.  This is judged to be reasonable and necessary in order to provide the required intensity of service to ensure the patient's safety given the presenting symptoms, physical exam findings and initial radiographic and laboratory data in the context of comorbid conditions.  The patient requires inpatient status due to high intensity of service, high risk of further deterioration and high frequency of surveillance required.  I certify that at the time of admission, it is my clinical judgment that the patient will require inpatient hospital care extending more than 2 midnights.                            Dispo: The patient is from: Home              Anticipated d/c is to: Home              Patient currently is not medically stable to d/c.              Difficult to place patient: No  TOTAL TIME TAKING CARE OF THIS PATIENT: *** minutes.     Christel Mormon M.D on 05/01/2021 at 9:04 PM  [facility]   From 7 PM-7 AM, contact night-coverage [email or web address]  CC: Primary care physician; Alfredia Client, MD

## 2021-05-01 NOTE — ED Provider Notes (Signed)
[facility] Provider Note    Event Date/Time   First MD Initiated Contact with Patient 05/01/21 1525     (approximate)   History   Diarrhea and Abdominal Pain   HPI  Karla Sanchez is a 81 y.o. female COPD, heart failure with an EF of 55 to 60%, diabetes, CKD who comes in with diarrhea, abdominal pain.  On review of records from [place] patient was admitted for being COVID-positive on 2/3 to 2/6.  She got 3 days of steroids and remdesivir.  She was discharged on 2/6 and then was readmitted to [facility] until 2/8 and given some IV diuresis the day of discharge she presented to the ER.   I reviewed patient's notes from [facility] and she had a CT PE that was negative for PE except for small pleural effusion and a CT abdomen that was also negative except for known  low ventral abdominal hernia.  She is got postsurgical changes related to prior right hemicolectomy.  No free air.  It appears that patient was discharged at 930 this morning from the ER at [facility]. Patient reports that she was on her way to spend the night here in [place] after being seen in the ER and started having diarrhea.  Patient reports multiple episodes with some generalized lower abdominal discomfort.  Denies any blood in her stool.  Review of records patient's from [facility] yesterday it appears that patient leaves AMA from multiple SNF so they usually refused to take her.  Patient was also given dose of fosfomycin for possible UTI  Physical Exam   Triage Vital Signs: ED Triage Vitals  Enc Vitals Group     BP 05/01/21 1524 (!) 148/55     Pulse Rate 05/01/21 1524 82     Resp 05/01/21 1524 18     Temp 05/01/21 1524 98.1 F (36.7 C)     Temp Source 05/01/21 1524 Oral     SpO2 05/01/21 1524 95 %     Weight 05/01/21 1521 250 lb 10.6 oz (113.7 kg)     Height 05/01/21 1521 5\' 6"  (1.676 m)     Head Circumference --      Peak Flow --      Pain Score 05/01/21 1521 9     Pain Loc --       Pain Edu? --      Excl. in [place]? --     Most recent vital signs: Vitals:   05/01/21 1524  BP: (!) 148/55  Pulse: 82  Resp: 18  Temp: 98.1 F (36.7 C)  SpO2: 95%     General: Awake, no distress. CV:  Good peripheral perfusion. Resp:  Normal effort.  Abd:  No distention.  No rebound or guarding, Other:  1+ edema in her bilateral legs Patient reports that she is wheelchair-bound at baseline but she is able to move all extremities   ED Results / Procedures / Treatments   Labs (all labs ordered are listed, but only abnormal results are displayed) Labs Reviewed  GASTROINTESTINAL PANEL BY PCR, STOOL (REPLACES STOOL CULTURE)  C DIFFICILE QUICK SCREEN W PCR REFLEX    LIPASE, BLOOD  COMPREHENSIVE METABOLIC PANEL  CBC  URINALYSIS, ROUTINE W REFLEX MICROSCOPIC  BRAIN NATRIURETIC PEPTIDE  TROPONIN I (HIGH SENSITIVITY)     EKG  My interpretation of EKG: Sinus rate of 83 without any ST elevations, T wave inversion in V2, right bundle branch block and left anterior fascicular block  RADIOLOGY I have reviewed the xray personally and agree with radiology read mild bronchial thickening that could be bronchitis versus pulmonary vascular congestion   PROCEDURES:  Critical Care performed: No  .1-3 Lead EKG Interpretation Performed by: Vanessa Tamarack, MD Authorized by: Vanessa Bancroft, MD     Interpretation: normal     ECG rate:  80   ECG rate assessment: normal     Rhythm: sinus rhythm     Ectopy: none     Conduction: normal     MEDICATIONS ORDERED IN ED: Medications  furosemide (LASIX) injection 40 mg (has no administration in time range)     IMPRESSION / MDM / ASSESSMENT AND PLAN / ED COURSE  I reviewed the triage vital signs and the nursing notes.                              Patient with multiple hospital admissions, hospital evaluations over the past few weeks who comes in with diarrhea.  Patient covered in stool and was helped to be cleaned off.  Patient  initially reported some lower abdominal tenderness and shortness of breath.  My plan was to get some CTs to rule out PE, obstruction, colitis etc. however patient just had CTs done this morning at 1 AM.  She got no rebound or guarding I have low suspicion for an acute change since this morning.  We did set up with some labs to evaluate for Electra abnormalities, AKI.  Given vitals are stable we will hold off on any fluids due to patient having history of CHF and little bit of swelling in her legs already noted.  On repeat evaluation patient is denying any abdominal pain at this time.  Suspect that some of her abdominal pain which is from the episodes of diarrhea.  We will see if we get a stool sample to send off for testing given multiple recent hospitalizations.  Patient would like to hold off on CT imaging given it was all reassuring this morning and I think this is reasonable given her abdomen is now soft and nontender.   Troponin was negative and unchanged on repeat UA has some trace leukocytes and some WBCs but nothing obvious and patient was just given a dose of fosfomycin BNP is slightly elevated at 241 Lipase slightly elevated but patient is not tender there CMP shows elevated sugar but normal anion gap.  Slight bump in her creatinine but downtrending from her admission where she was 1.7 CBC shows no evidence of new anemia  Chest x-ray is concerning for some pulmonary vascular congestion and she had a CT showing an effusion as well as some leg swelling I will discuss with the hospital team for admission for CHF exacerbation.  Patient will need help with placement due to issues of taking care of herself as well.  The patient is on the cardiac monitor to evaluate for evidence of arrhythmia and/or significant heart rate changes.   FINAL CLINICAL IMPRESSION(S) / ED DIAGNOSES   Final diagnoses:  Acute on chronic congestive heart failure, unspecified heart failure type ([place])     Rx / DC  Orders   ED Discharge Orders     None        Note:  This document was prepared using Dragon voice recognition software and may include unintentional dictation errors.   Vanessa [place], MD 05/01/21 2004

## 2021-05-01 NOTE — ED Notes (Signed)
Peri care performed, patient repositioned, linens changed, purewick replaced, barrier cream applied to red areas on buttock and around groin.

## 2021-05-02 DIAGNOSIS — N3000 Acute cystitis without hematuria: Secondary | ICD-10-CM

## 2021-05-02 DIAGNOSIS — A0839 Other viral enteritis: Secondary | ICD-10-CM

## 2021-05-02 DIAGNOSIS — I5031 Acute diastolic (congestive) heart failure: Secondary | ICD-10-CM

## 2021-05-02 DIAGNOSIS — J9611 Chronic respiratory failure with hypoxia: Secondary | ICD-10-CM | POA: Diagnosis not present

## 2021-05-02 DIAGNOSIS — N1831 Chronic kidney disease, stage 3a: Secondary | ICD-10-CM | POA: Diagnosis not present

## 2021-05-02 LAB — RESP PANEL BY RT-PCR (FLU A&B, COVID) ARPGX2
Influenza A by PCR: NEGATIVE
Influenza B by PCR: NEGATIVE
SARS Coronavirus 2 by RT PCR: POSITIVE — AB

## 2021-05-02 LAB — CBC
HCT: 37 % (ref 36.0–46.0)
Hemoglobin: 11.6 g/dL — ABNORMAL LOW (ref 12.0–15.0)
MCH: 26.8 pg (ref 26.0–34.0)
MCHC: 31.4 g/dL (ref 30.0–36.0)
MCV: 85.5 fL (ref 80.0–100.0)
Platelets: 298 10*3/uL (ref 150–400)
RBC: 4.33 MIL/uL (ref 3.87–5.11)
RDW: 16.4 % — ABNORMAL HIGH (ref 11.5–15.5)
WBC: 7.9 10*3/uL (ref 4.0–10.5)
nRBC: 0 % (ref 0.0–0.2)

## 2021-05-02 LAB — CBG MONITORING, ED
Glucose-Capillary: 170 mg/dL — ABNORMAL HIGH (ref 70–99)
Glucose-Capillary: 182 mg/dL — ABNORMAL HIGH (ref 70–99)

## 2021-05-02 LAB — GLUCOSE, CAPILLARY: Glucose-Capillary: 154 mg/dL — ABNORMAL HIGH (ref 70–99)

## 2021-05-02 LAB — BASIC METABOLIC PANEL
Anion gap: 6 (ref 5–15)
BUN: 43 mg/dL — ABNORMAL HIGH (ref 8–23)
CO2: 29 mmol/L (ref 22–32)
Calcium: 7.5 mg/dL — ABNORMAL LOW (ref 8.9–10.3)
Chloride: 104 mmol/L (ref 98–111)
Creatinine, Ser: 1.18 mg/dL — ABNORMAL HIGH (ref 0.44–1.00)
GFR, Estimated: 47 mL/min — ABNORMAL LOW (ref >60)
Glucose, Bld: 168 mg/dL — ABNORMAL HIGH (ref 70–99)
Potassium: 4.3 mmol/L (ref 3.5–5.1)
Sodium: 139 mmol/L (ref 135–145)

## 2021-05-02 MED ORDER — SODIUM CHLORIDE 0.9 % IV SOLN
1.0000 g | Freq: Every day | INTRAVENOUS | Status: DC
  Administered 2021-05-02: 1 g via INTRAVENOUS
  Filled 2021-05-02: qty 10

## 2021-05-02 MED ORDER — INSULIN ASPART 100 UNIT/ML IJ SOLN
0.0000 [IU] | Freq: Three times a day (TID) | INTRAMUSCULAR | Status: DC
  Administered 2021-05-02 (×3): 3 [IU] via SUBCUTANEOUS
  Administered 2021-05-03: 2 [IU] via SUBCUTANEOUS
  Administered 2021-05-03: 3 [IU] via SUBCUTANEOUS
  Administered 2021-05-04: 5 [IU] via SUBCUTANEOUS
  Administered 2021-05-04: 2 [IU] via SUBCUTANEOUS
  Administered 2021-05-05 – 2021-05-07 (×6): 3 [IU] via SUBCUTANEOUS
  Administered 2021-05-07: 5 [IU] via SUBCUTANEOUS
  Administered 2021-05-07 – 2021-05-09 (×5): 3 [IU] via SUBCUTANEOUS
  Administered 2021-05-09: 2 [IU] via SUBCUTANEOUS
  Administered 2021-05-09: 3 [IU] via SUBCUTANEOUS
  Administered 2021-05-10: 5 [IU] via SUBCUTANEOUS
  Administered 2021-05-10 – 2021-05-11 (×4): 3 [IU] via SUBCUTANEOUS
  Administered 2021-05-11: 2 [IU] via SUBCUTANEOUS
  Administered 2021-05-12: 3 [IU] via SUBCUTANEOUS
  Administered 2021-05-12: 2 [IU] via SUBCUTANEOUS
  Filled 2021-05-02 (×28): qty 1

## 2021-05-02 NOTE — Assessment & Plan Note (Addendum)
Patient came from nursing home, will transfer back to nursing home at discharge.

## 2021-05-02 NOTE — ED Notes (Signed)
Lab here to draw AM labs.  Pt refused labs at this time and requests that they come back at 8am for needed labwork.  (Per [facility]tech)

## 2021-05-02 NOTE — Assessment & Plan Note (Signed)
Echocardiogram performed in March 2022 showed ejection fraction 55 to 60% with grade 1 diastolic dysfunction.  Patient has significant short of breath, leg edema, paroxysmal nocturnal dyspnea consistent with acute on chronic diastolic congestive heart failure.  Continue IV Lasix 40 mg twice a day, monitor BMP and magnesium.

## 2021-05-02 NOTE — Progress Notes (Signed)
Progress Note   Patient: Karla Sanchez IHK:[id number] DOB: Feb 06, 1941 DOA: 05/01/2021     1 DOS: the patient was seen and examined on 05/02/2021   Brief hospital course: Renada Cronin is a 81 y.o. female with medical history significant for HFpEF, colon cancer status post left colectomy, COPD, type 2 diabetes mellitus, diastolic heart failure, gout and hypertension, who presented to the ER with acute onset of diarrhea and associated abdominal pain.  She also been complaining of short of breath and a paroxysmal active dyspnea.  She diagnosed with acute on chronic diastolic congestive heart failure, started on IV Lasix.  Assessment and Plan: Chronic kidney disease, stage 3a ([place]) Reviewed her previous chart, patient has a chronic kidney disease stage IIIa.  Renal function stable after diuretics.  Acute cystitis Patient has received a dose of fosfomycin, antibiotics completed.  Gastroenteritis due to COVID-19 virus Patient has nausea vomiting diarrhea most likely due to recent COVID.  C. difficile toxin is pending. Patient first COVID-positive was 7 days ago.  We will continue isolation for additional 3 days.  Acute CHF (congestive heart failure) (HCC) Echocardiogram performed in March 2022 showed ejection fraction 55 to 60% with grade 1 diastolic dysfunction.  Patient has significant short of breath, leg edema, paroxysmal nocturnal dyspnea consistent with acute on chronic diastolic congestive heart failure.  Continue IV Lasix 40 mg twice a day, monitor BMP and magnesium.  Physical deconditioning- (present on admission) Patient came from nursing home, will transfer back to nursing home at discharge.  Chronic respiratory failure with hypoxia ([place])- (present on admission) Continue oxygen treatment.  Hypertension- (present on admission) Continue home medicines.  Obesity, Class III, BMI 40-49.9 (morbid obesity) ([place])- (present on admission) Patient has morbid obesity with a  BMI of 48.7.  Continue to follow.  COPD (chronic obstructive pulmonary disease) ([place])- (present on admission) Patient does not have bronchospasm, continue to follow.     Subjective:  Patient still complaining significant short of breath with minimal exertion.  She also complaining of paroxysmal nocturnal dyspnea and orthopnea.  She has significant leg edema. She has no nausea vomiting today.  Diarrhea seem to be slowing down.  Physical Exam: Vitals:   05/02/21 1130 05/02/21 1230 05/02/21 1400 05/02/21 1505  BP: (!) 169/59 (!) 149/38 (!) 169/52 (!) 159/82  Pulse: (!) 51 64 (!) 58 (!) 56  Resp: 19 18 17 20   Temp:   98.1 F (36.7 C)   TempSrc:   Oral   SpO2: 98% 93% 93% 100%  Weight:      Height:       General exam: Appears calm and comfortable  Respiratory system: Decreased breath sounds with some crackles in the base. Respiratory effort normal. Cardiovascular system: S1 & S2 heard, RRR. No JVD, murmurs, rubs, gallops or clicks.  Gastrointestinal system: Abdomen is nondistended, soft and nontender. No organomegaly or masses felt. Normal bowel sounds heard. Central nervous system: Alert and oriented. No focal neurological deficits. Extremities: 2+ leg edema Skin: No rashes, lesions or ulcers Psychiatry: Judgement and insight appear normal. Mood & affect appropriate.   Data Reviewed: Reviewed labs including CBC and BMP.  Reviewed her prior echocardiogram in March.  Reviewed chest x-ray.  Family Communication:   Disposition: Status is: Inpatient Remains inpatient appropriate because: Severe artery disease, IV Lasix.          Planned Discharge Destination: Skilled nursing facility   Time spent: 35 minutes  Author: Sharen Hones, MD 05/02/2021 3:28 PM  For on call  review [email or web address].

## 2021-05-02 NOTE — Assessment & Plan Note (Signed)
Continue oxygen treatment.

## 2021-05-02 NOTE — NC FL2 (Signed)
[place] LEVEL OF CARE SCREENING TOOL     IDENTIFICATION  Patient Name: Karla Sanchez Birthdate: [birth year] Sex: female Admission Date (Current Location): 05/01/2021  [place] and [place] Number:  Engineering geologist and Address:  [facility], [address] [place], [place] [address]      Provider Number: [id number]  Attending Physician Name and Address:  Sharen Hones, MD  Relative Name and Phone Number:  [name] -nephew [phone]    Current Level of Care: Hospital Recommended Level of Care: [place] Prior Approval Number:    Date Approved/Denied:   PASRR Number: [id number]  Discharge Plan: SNF    Current Diagnoses: Patient Active Problem List   Diagnosis Date Noted   UTI (urinary tract infection) 03/30/2021   Acute CHF (congestive heart failure) ([place]) 08/13/2020   Acute on chronic diastolic heart failure ([place]) 04/25/2020   SOB (shortness of breath) 04/25/2020   Elevated troponin 04/25/2020   B12 deficiency    Short-term memory loss    Weakness    Fungal skin infection    Obesity (BMI 30-39.9)    Cellulitis of right leg 01/04/2020   COPD with acute exacerbation ([place]) 12/08/2019   Physical deconditioning 12/08/2019   Acute respiratory failure with hypoxia (HCC)    Iron deficiency anemia    Type 2 diabetes mellitus without complication, with long-term current use of insulin (HCC)    CHF exacerbation (HCC) 11/20/2019   Acute on chronic diastolic (congestive) heart failure (HCC) 11/19/2019   Acute pulmonary edema (HCC)    Acute on chronic diastolic CHF (congestive heart failure) ([place]) 10/22/2019   Cellulitis of right foot 10/22/2019   Non compliance with medical treatment    Chronic respiratory failure with hypoxia (HCC)    Hypertension    Chronic kidney disease, stage 3b (HCC)    Acute on chronic respiratory failure with hypoxia (HCC)    Diarrhea    COPD (chronic obstructive pulmonary  disease) ([place]) 10/03/2019   Obesity, Class III, BMI 40-49.9 (morbid obesity) ([place]) 10/03/2019   Chronic diastolic CHF (congestive heart failure) ([place]) 07/09/2019   DM2 (diabetes mellitus, type 2) ([place]) 07/09/2019   Peripheral edema 07/09/2019   Gout 07/09/2019   Obesity, diabetes, and hypertension syndrome ([place]) 07/09/2019   Anemia of chronic disease 07/09/2019    Orientation RESPIRATION BLADDER Height & Weight     Time, Self, Situation, Place  O2 ([place] 2L) Continent Weight: 113.7 kg Height:  5\' 6"  (167.6 cm)  BEHAVIORAL SYMPTOMS/MOOD NEUROLOGICAL BOWEL NUTRITION STATUS      Incontinent    AMBULATORY STATUS COMMUNICATION OF NEEDS Skin   Extensive Assist Verbally Other (Comment) (redness and irritation, groin and buttocks MSDA)                       Personal Care Assistance Level of Assistance  Bathing, Feeding, Dressing Bathing Assistance: Maximum assistance Feeding assistance: Limited assistance Dressing Assistance: Maximum assistance     Functional Limitations Info  Sight, Hearing, Speech Sight Info: Adequate Hearing Info: Adequate Speech Info: Adequate    SPECIAL CARE FACTORS FREQUENCY  PT (By licensed PT), OT (By licensed OT)     PT Frequency: 5 times per week OT Frequency: 5 times per week            Contractures Contractures Info: Not present    Additional Factors Info  Code Status, Allergies Code Status Info: DNR Allergies Info: Codeine, exenatide, levofloxacin, losartan, sulfa, tetracyclines, statins  Current Medications (05/02/2021):  This is the current hospital active medication list Current Facility-Administered Medications  Medication Dose Route Frequency Provider Last Rate Last Admin   acetaminophen (TYLENOL) tablet 650 mg  650 mg Oral Q6H PRN Mansy, Jan A, MD       Or   acetaminophen (TYLENOL) suppository 650 mg  650 mg Rectal Q6H PRN Mansy, Jan A, MD       enoxaparin (LOVENOX) injection 57.5 mg  0.5 mg/kg Subcutaneous Q24H  Mansy, Jan A, MD   57.5 mg at 05/01/21 2206   furosemide (LASIX) injection 40 mg  40 mg Intravenous Q12H Mansy, Jan A, MD   40 mg at 05/02/21 0758   insulin aspart (novoLOG) injection 0-15 Units  0-15 Units Subcutaneous TID WC Mansy, Jan A, MD   3 Units at 05/02/21 1144   ondansetron (ZOFRAN) tablet 4 mg  4 mg Oral Q6H PRN Mansy, Jan A, MD       Or   ondansetron [facility]) injection 4 mg  4 mg Intravenous Q6H PRN Mansy, Jan A, MD       polyethylene glycol (MIRALAX / GLYCOLAX) packet 17 g  17 g Oral Daily PRN Mansy, Jan A, MD       traZODone (DESYREL) tablet 25 mg  25 mg Oral QHS PRN Mansy, Arvella Merles, MD       Current Outpatient Medications  Medication Sig Dispense Refill   acetaminophen (TYLENOL) 325 MG tablet Take 650 mg by mouth every 6 (six) hours as needed for mild pain or fever.      albuterol (VENTOLIN HFA) 108 (90 Base) MCG/ACT inhaler Inhale 2 puffs into the lungs every 6 (six) hours as needed for wheezing or shortness of breath. 8 g 0   amLODipine (NORVASC) 10 MG tablet Take 10 mg by mouth daily.     aspirin EC 81 MG tablet Take 1 tablet (81 mg total) by mouth daily. Swallow whole. 30 tablet 0   cetirizine (ZYRTEC) 10 MG tablet Take 10 mg by mouth daily.     furosemide (LASIX) 40 MG tablet Take 80mg  po qam and 40mg  po qpm 60 tablet 2   metoprolol tartrate (LOPRESSOR) 25 MG tablet Take 0.5 tablets by mouth in the morning and at bedtime.     MIRALAX 17 GM/SCOOP powder Take by mouth.     nitrofurantoin, macrocrystal-monohydrate, (MACROBID) 100 MG capsule Take 100 mg by mouth 2 (two) times daily.     traZODone (DESYREL) 100 MG tablet Take 1 tablet by mouth at bedtime as needed.     TRELEGY ELLIPTA 100-62.5-25 MCG/ACT AEPB Inhale 1 puff into the lungs daily.       Discharge Medications: Please see discharge summary for a list of discharge medications.  Relevant Imaging Results:  Relevant Lab Results:   Additional Information SS# [id number]  Shelbie Hutching, RN

## 2021-05-02 NOTE — Assessment & Plan Note (Signed)
Patient does not have bronchospasm, continue to follow.

## 2021-05-02 NOTE — Assessment & Plan Note (Signed)
Reviewed her previous chart, patient has a chronic kidney disease stage IIIa.  Renal function stable after diuretics.

## 2021-05-02 NOTE — ED Notes (Signed)
Went in to answer call bell patient and done as the patient asked , and offered to checked patient to if she was wet patient refused

## 2021-05-02 NOTE — ED Notes (Signed)
85% oxygen saturation documented as erroneous entry.

## 2021-05-02 NOTE — Assessment & Plan Note (Signed)
Patient has received a dose of fosfomycin, antibiotics completed.

## 2021-05-02 NOTE — Hospital Course (Addendum)
Karla Sanchez is a 81 y.o. female with medical history significant for recent COVID-19 infection (positive test on 04/30/2021), colon cancer status post left colectomy, COPD, type 2 diabetes mellitus, chronic diastolic heart failure, gout and hypertension, who presented to the ER with acute onset of diarrhea and associated abdominal pain.  She also complained of shortness of breath and paroxysmal nocturnal dyspnea.   She was admitted to the hospital for gastroenteritis from COVID-19 infection and acute on chronic diastolic CHF complicated by acute hypoxic respiratory failure.  She was treated with IV Lasix and oxygen via nasal cannula.  She also received a dose of IV Rocephin for suspected UTI but urine culture was negative and UTI was felt to be unlikely.  She was evaluated by PT who recommended further rehabilitation at the skilled nursing facility.  Her condition has improved and she is deemed stable for discharge to SNF today.

## 2021-05-02 NOTE — Assessment & Plan Note (Signed)
Patient has morbid obesity with a BMI of 48.7.  Continue to follow.

## 2021-05-02 NOTE — ED Notes (Signed)
Pt de-sating while asleep. Pt placed on 2 liters of oxygen via nasal canula while sleeping. Sats 98-100% on 2 liters

## 2021-05-02 NOTE — Assessment & Plan Note (Signed)
Patient has nausea vomiting diarrhea most likely due to recent COVID.  C. difficile toxin is pending. Patient first COVID-positive was 7 days ago.  We will continue isolation for additional 3 days.

## 2021-05-02 NOTE — Assessment & Plan Note (Signed)
Continue home medicines.
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
## 2021-05-03 DIAGNOSIS — J9611 Chronic respiratory failure with hypoxia: Secondary | ICD-10-CM | POA: Diagnosis not present

## 2021-05-03 DIAGNOSIS — I5031 Acute diastolic (congestive) heart failure: Secondary | ICD-10-CM | POA: Diagnosis not present

## 2021-05-03 DIAGNOSIS — N3000 Acute cystitis without hematuria: Secondary | ICD-10-CM | POA: Diagnosis not present

## 2021-05-03 LAB — GLUCOSE, CAPILLARY
Glucose-Capillary: 143 mg/dL — ABNORMAL HIGH (ref 70–99)
Glucose-Capillary: 178 mg/dL — ABNORMAL HIGH (ref 70–99)
Glucose-Capillary: 117 mg/dL — ABNORMAL HIGH (ref 70–99)
Glucose-Capillary: 210 mg/dL — ABNORMAL HIGH (ref 70–99)

## 2021-05-03 LAB — BASIC METABOLIC PANEL
Anion gap: 8 (ref 5–15)
BUN: 34 mg/dL — ABNORMAL HIGH (ref 8–23)
CO2: 30 mmol/L (ref 22–32)
Calcium: 7.9 mg/dL — ABNORMAL LOW (ref 8.9–10.3)
Chloride: 105 mmol/L (ref 98–111)
Creatinine, Ser: 1.13 mg/dL — ABNORMAL HIGH (ref 0.44–1.00)
GFR, Estimated: 49 mL/min — ABNORMAL LOW (ref >60)
Glucose, Bld: 137 mg/dL — ABNORMAL HIGH (ref 70–99)
Potassium: 4.4 mmol/L (ref 3.5–5.1)
Sodium: 143 mmol/L (ref 135–145)

## 2021-05-03 LAB — URINE CULTURE: Culture: <10000 — AB

## 2021-05-03 LAB — MAGNESIUM: Magnesium: 1.6 mg/dL — ABNORMAL LOW (ref 1.7–2.4)

## 2021-05-03 MED ORDER — MAGNESIUM SULFATE 2 GM/50ML IV SOLN
2.0000 g | Freq: Once | INTRAVENOUS | Status: AC
  Administered 2021-05-03: 2 g via INTRAVENOUS
  Filled 2021-05-03: qty 50

## 2021-05-03 NOTE — TOC Transition Note (Signed)
Transition of Care [facility]) - CM/SW Discharge Note   Patient Details  Name: Karla Sanchez MRN: [id number] Date of Birth: January 23, 1941  Transition of Care [facility] CM/SW Contact:  Raina Mina, [place] Phone Number: 05/03/2021, 12:27 PM   Clinical Narrative:   Tammy with [facility] resources will review patient for placement.       Barriers to Discharge: Continued Medical Work up   Patient Goals and CMS Choice Patient states their goals for this hospitalization and ongoing recovery are:: Patient wants to go to short term rehab, says she can't take care of herself right now CMS Medicare.gov Compare Post Acute Care list provided to:: Patient Choice offered to / list presented to : Patient  Discharge Placement                       Discharge Plan and Services   Discharge Planning Services: CM Consult Post Acute Care Choice: [place]          DME Arranged: N/A DME Agency: NA                  Social Determinants of Health ([place]) Interventions     Readmission Risk Interventions Readmission Risk Prevention Plan 06/10/2020 10/24/2019  Transportation Screening Complete -  Medication Review Press photographer) Complete Complete  PCP or Specialist appointment within 3-5 days of discharge Complete -  [place] or Home Care Consult Complete Complete  SW Recovery Care/Counseling Consult Complete -  Palliative Care Screening Not Applicable -  [place] Complete Patient Refused  Some recent data might be hidden

## 2021-05-03 NOTE — Evaluation (Signed)
Occupational Therapy Evaluation Patient Details Name: Karla Sanchez MRN: [id number] DOB: 04-17-1940 Today's Date: 05/03/2021   History of Present Illness Pt is an 81 y.o. female with medical history significant for HFpEF, colon cancer status post left colectomy, COPD, type 2 diabetes mellitus, diastolic heart failure, gout and hypertension, who presented to the ER with acute onset of diarrhea and associated abdominal pain.  She also been complaining of short of breath and a paroxysmal active dyspnea.  She diagnosed with acute on chronic diastolic congestive heart failure. noted for recent hospitalizations ([name] discharged from there and pt drove to [facility], and a hospital in [place] prior to that per patient).   Clinical Impression   Pt seen for OT evaluation this date in setting of acute hospitalization d/t COVID. She presents to hospital with decreased fxl activity tolerance and strength. At baseline, she uses w/c for fxl transfers and does not complete fxl mobility. This date, she requires MAX A +2 for in-bed repositioning and requires MAX encouragement to participate. On ADL assessment, based on clinical observation, she requires SETUP for seated UV ADLs and MOD A for seated LB ADLs. She is left in bed with all needs met and in reach at end of session. Will continue to follow. Recommend STR f/u OT services to strengthen and improve ADL participation/independence.     Recommendations for follow up therapy are one component of a multi-disciplinary discharge planning process, led by the attending physician.  Recommendations may be updated based on patient status, additional functional criteria and insurance authorization.   Follow Up Recommendations  Skilled nursing-short term rehab (<3 hours/day)    Assistance Recommended at Discharge Intermittent Supervision/Assistance  Patient can return home with the following A little help with walking and/or transfers;A little help with  bathing/dressing/bathroom;Assistance with cooking/housework;Direct supervision/assist for medications management;Direct supervision/assist for financial management;Assist for transportation;Help with stairs or ramp for entrance    Functional Status Assessment  Patient has had a recent decline in their functional status and demonstrates the ability to make significant improvements in function in a reasonable and predictable amount of time.  Equipment Recommendations  Other (comment) (defer to next level of care)    Recommendations for Other Services       Precautions / Restrictions Precautions Precautions: Fall Restrictions Weight Bearing Restrictions: No      Mobility Bed Mobility Overal bed mobility: Needs Assistance             General bed mobility comments: long sitting in bed with PT when OT enters, requires 2+ MAX A for proplsion towards [facility] which is attempted in sitting using scooting technique and ultimately has to be completed in supine as pt does not appear to engage triceps to meaningfully contribute to scooting    Transfers                          Balance                                           ADL either performed or assessed with clinical judgement   ADL                                         General ADL Comments: SETUP for seated UB  ADLs, MOD A For seated LB ADLs.     Vision Patient Visual Report: No change from baseline       Perception     Praxis      Pertinent Vitals/Pain Pain Assessment Pain Assessment: Faces Faces Pain Scale: Hurts a little bit Pain Location: R posterior thigh Pain Descriptors / Indicators: Aching, Grimacing Pain Intervention(s): Limited activity within patient's tolerance, Monitored during session, Repositioned     Hand Dominance Left   Extremity/Trunk Assessment Upper Extremity Assessment Upper Extremity Assessment: Generalized weakness   Lower Extremity  Assessment Lower Extremity Assessment: Generalized weakness   Cervical / Trunk Assessment Cervical / Trunk Assessment: Kyphotic   Communication Communication Communication: No difficulties   Cognition Arousal/Alertness: Awake/alert Behavior During Therapy: WFL for tasks assessed/performed Overall Cognitive Status: Within Functional Limits for tasks assessed                                 General Comments: somewhat slow response time/processing time. She is oriented, but well known to this author and her cyclical hospitalizations would suggest decreased insight into deficits.     General Comments       Exercises Other Exercises Other Exercises: OT ed re: role, importance of attempting to mobilize and move/use extremities to prevent further atrophy   Shoulder Instructions      Home Living Family/patient expects to be discharged to:: Shelter/Homeless                                 Additional Comments: pt living in a hotel prior to recent hospitalizations. pt reported falling out of bed, and the hospital staff sending her to the hospital      Prior Functioning/Environment               Mobility Comments: able to stand pivot to WC/commode at baseline          OT Problem List: Decreased activity tolerance;Decreased strength;Cardiopulmonary status limiting activity;Obesity;Increased edema      OT Treatment/Interventions: Self-care/ADL training;Therapeutic exercise;DME and/or AE instruction    OT Goals(Current goals can be found in the care plan section) Acute Rehab OT Goals Patient Stated Goal: none stated OT Goal Formulation: With patient Time For Goal Achievement: 05/17/21 Potential to Achieve Goals: Fair ADL Goals Pt Will Transfer to Toilet: with supervision Pt Will Perform Toileting - Clothing Manipulation and hygiene: with supervision  OT Frequency: Min 2X/week    Co-evaluation              AM-PAC OT "6 Clicks" Daily  Activity     Outcome Measure Help from another person eating meals?: None Help from another person taking care of personal grooming?: A Little Help from another person toileting, which includes using toliet, bedpan, or urinal?: A Lot Help from another person bathing (including washing, rinsing, drying)?: A Lot Help from another person to put on and taking off regular upper body clothing?: A Little Help from another person to put on and taking off regular lower body clothing?: A Lot 6 Click Score: 16   End of Session Nurse Communication: Mobility status  Activity Tolerance: Other (comment) (self limiting) Patient left: in bed;with bed alarm set  OT Visit Diagnosis: Muscle weakness (generalized) (M62.81)                Time: 7939-0300 OT Time Calculation (min): 8 min Charges:  OT General Charges $OT Visit: 1 Visit OT Evaluation $OT Eval Moderate Complexity: [place], MS, OTR/L ascom [phone] 05/03/21, 4:32 PM

## 2021-05-03 NOTE — Progress Notes (Signed)
PROGRESS NOTE    Karla Sanchez  SAY:[id number] DOB: Aug 01, 1940 DOA: 05/01/2021 PCP: Alfredia Client, MD   Chief complaint.  Shortness of breath. Brief Narrative:  Karla Sanchez is a 81 y.o. female with medical history significant for HFpEF, colon cancer status post left colectomy, COPD, type 2 diabetes mellitus, diastolic heart failure, gout and hypertension, who presented to the ER with acute onset of diarrhea and associated abdominal pain.  She also been complaining of short of breath and a paroxysmal active dyspnea.  She diagnosed with acute on chronic diastolic congestive heart failure, started on IV Lasix.   Assessment & Plan:   Active Problems:   COPD (chronic obstructive pulmonary disease) (HCC)   Obesity, Class III, BMI 40-49.9 (morbid obesity) ([place])   Hypertension   Chronic respiratory failure with hypoxia (HCC)   Type 2 diabetes mellitus without complication, with long-term current use of insulin (HCC)   Physical deconditioning   Acute CHF (congestive heart failure) ([place])   Gastroenteritis due to COVID-19 virus   Acute cystitis   Chronic kidney disease, stage 3a (HCC)    Acute on chronic diastolic CHF (congestive heart failure) (HCC) Echocardiogram performed in March 2022 showed ejection fraction 55 to 60% with grade 1 diastolic dysfunction.  Receiving IV Lasix, condition is improving.  Continue diuretics for now.   Chronic kidney disease, stage 3a ([place]) Continue monitor renal function with continued diuretics.   Acute cystitis Patient has received a dose of fosfomycin, antibiotics completed.   Gastroenteritis due to COVID-19 virus Condition has been improving.  No additional diarrhea.   Physical deconditioning- (present on admission) Patient is evaluated by physical therapy, recommended nursing home placement.   Chronic respiratory failure with hypoxia ([place])- (present on admission) Continue oxygen treatment.   Hypertension- (present  on admission) Continue home medicines.   Obesity, Class III, BMI 40-49.9 (morbid obesity) ([place])- (present on admission) Patient has morbid obesity with a BMI of 48.7.     COPD (chronic obstructive pulmonary disease) ([place])- (present on admission) Stable.  Type 2 diabetes Continue sliding scale insulin.   DVT prophylaxis: Lovenox Code Status: DNR Family Communication:  Disposition Plan: Pending nursing home placement.   Status is: Inpatient Remains inpatient appropriate because: Severity of disease, IV Lasix.            I/O last 3 completed shifts: In: 100 [IV Piggyback:100] Out: 3300 [Urine:3300] Total I/O In: 109 [P.O.:720] Out: 700 [Urine:700]     Consultants:  None  Procedures: None  Antimicrobials: None  Subjective: Patient feels better today, she slept well last night.  She still has been short of breath with exertion. She has no cough or chest pain. Diarrhea resolved, she is tolerating diet without abdominal pain or nausea vomiting.  Objective: Vitals:   05/03/21 0500 05/03/21 0511 05/03/21 0818 05/03/21 1118  BP:  (!) 173/55 (!) 160/55 (!) 150/51  Pulse:  66 68 (!) 56  Resp:  18 19 18   Temp:  98 F (36.7 C) 97.9 F (36.6 C) 97.8 F (36.6 C)  TempSrc:  Oral    SpO2:  97% 96% 98%  Weight: 111.4 kg     Height:        Intake/Output Summary (Last 24 hours) at 05/03/2021 1306 Last data filed at 05/03/2021 1258 Gross per 24 hour  Intake 720 ml  Output 2500 ml  Net -1780 ml   Filed Weights   05/01/21 1521 05/03/21 0500  Weight: 113.7 kg 111.4 kg    Examination:  General exam: Appears calm and comfortable  Respiratory system: Decreased breathing sounds. respiratory effort normal. Cardiovascular system: S1 & S2 heard, RRR. No JVD, murmurs, rubs, gallops or clicks. 1+ pedal edema. Gastrointestinal system: Abdomen is nondistended, soft and nontender. No organomegaly or masses felt. Normal bowel sounds heard. Central nervous system: Alert  and oriented. No focal neurological deficits. Extremities: Symmetric 5 x 5 power. Skin: No rashes, lesions or ulcers Psychiatry: Judgement and insight appear normal. Mood & affect appropriate.     Data Reviewed: I have personally reviewed following labs and imaging studies  CBC: Recent Labs  Lab 05/01/21 1701 05/02/21 0816  WBC 6.6 7.9  HGB 11.5* 11.6*  HCT 37.1 37.0  MCV 85.5 85.5  PLT 304 322   Basic Metabolic Panel: Recent Labs  Lab 05/01/21 1701 05/02/21 0816 05/03/21 0547  NA 137 139 143  K 4.9 4.3 4.4  CL 103 104 105  CO2 25 29 30   GLUCOSE 372* 168* 137*  BUN 47* 43* 34*  CREATININE 1.44* 1.18* 1.13*  CALCIUM 7.7* 7.5* 7.9*  MG  --   --  1.6*   GFR: Estimated Creatinine Clearance: 50.2 mL/min (A) (by C-G formula based on SCr of 1.13 mg/dL (H)). Liver Function Tests: Recent Labs  Lab 05/01/21 1701  AST 22  ALT 18  ALKPHOS 87  BILITOT 0.3  PROT 5.8*  ALBUMIN 2.6*   Recent Labs  Lab 05/01/21 1701  LIPASE 64*   No results for input(s): AMMONIA in the last 168 hours. Coagulation Profile: No results for input(s): INR, PROTIME in the last 168 hours. Cardiac Enzymes: No results for input(s): CKTOTAL, CKMB, CKMBINDEX, TROPONINI in the last 168 hours. BNP (last 3 results) No results for input(s): PROBNP in the last 8760 hours. HbA1C: No results for input(s): HGBA1C in the last 72 hours. CBG: Recent Labs  Lab 05/02/21 0744 05/02/21 1132 05/02/21 1704 05/03/21 0820 05/03/21 1115  GLUCAP 170* 182* 154* 143* 178*   Lipid Profile: No results for input(s): CHOL, HDL, LDLCALC, TRIG, CHOLHDL, LDLDIRECT in the last 72 hours. Thyroid Function Tests: No results for input(s): TSH, T4TOTAL, FREET4, T3FREE, THYROIDAB in the last 72 hours. Anemia Panel: No results for input(s): VITAMINB12, FOLATE, FERRITIN, TIBC, IRON, RETICCTPCT in the last 72 hours. Sepsis Labs: No results for input(s): PROCALCITON, LATICACIDVEN in the last 168 hours.  Recent Results  (from the past 240 hour(s))  Urine Culture     Status: Abnormal   Collection Time: 05/01/21  6:23 PM   Specimen: Urine, Random  Result Value Ref Range Status   Specimen Description   Final    URINE, RANDOM Performed at [facility], [address] [place], [place] [address]    Special Requests   Final    NONE Performed at [facility], [address], [place], [place] [address]    Culture (A)  Final    <10,000 COLONIES/mL INSIGNIFICANT GROWTH Performed at [facility] [address] [place], [place] [address]    Report Status 05/03/2021 FINAL  Final  Resp Panel by RT-PCR (Flu A&B, Covid) Nasopharyngeal Swab     Status: Abnormal   Collection Time: 05/01/21 10:08 PM   Specimen: Nasopharyngeal Swab; Nasopharyngeal(NP) swabs in vial transport medium  Result Value Ref Range Status   SARS Coronavirus 2 by RT PCR POSITIVE (A) NEGATIVE Final    Comment: (NOTE) SARS-CoV-2 target nucleic acids are DETECTED.  The SARS-CoV-2 RNA is generally detectable in upper respiratory specimens during the acute phase  of infection. Positive results are indicative of the presence of the identified virus, but do not rule out bacterial infection or co-infection with other pathogens not detected by the test. Clinical correlation with patient history and other diagnostic information is necessary to determine patient infection status. The expected result is Negative.  Fact Sheet for Patients: EntrepreneurPulse.com.au  Fact Sheet for Healthcare Providers: IncredibleEmployment.be  This test is not yet approved or cleared by the [place] FDA and  has been authorized for detection and/or diagnosis of SARS-CoV-2 by FDA under an Emergency Use Authorization (EUA).  This EUA will remain in effect (meaning this test can be used) for the duration of  the COVID-19 declaration under Section 564(b)(1) of the A ct, 21 U.S.C. section 360bbb-3(b)(1),  unless the authorization is terminated or revoked sooner.     Influenza A by PCR NEGATIVE NEGATIVE Final   Influenza B by PCR NEGATIVE NEGATIVE Final    Comment: (NOTE) The Xpert Xpress SARS-CoV-2/FLU/RSV plus assay is intended as an aid in the diagnosis of influenza from Nasopharyngeal swab specimens and should not be used as a sole basis for treatment. Nasal washings and aspirates are unacceptable for Xpert Xpress SARS-CoV-2/FLU/RSV testing.  Fact Sheet for Patients: EntrepreneurPulse.com.au  Fact Sheet for Healthcare Providers: IncredibleEmployment.be  This test is not yet approved or cleared by the [place] FDA and has been authorized for detection and/or diagnosis of SARS-CoV-2 by FDA under an Emergency Use Authorization (EUA). This EUA will remain in effect (meaning this test can be used) for the duration of the COVID-19 declaration under Section 564(b)(1) of the Act, 21 U.S.C. section 360bbb-3(b)(1), unless the authorization is terminated or revoked.  Performed at [facility], [address] [place], [place] [address]          Radiology Studies: DG Chest Portable 1 View  Result Date: 05/01/2021 CLINICAL DATA:  Shortness of breath, right lower quadrant abdominal pain and diarrhea. EXAM: PORTABLE CHEST 1 VIEW COMPARISON:  Chest radiograph dated August 25, 2020. FINDINGS: The heart size and mediastinal contours are within normal limits. Atherosclerotic calcification of aortic arch. Coarse interstitial markings prominent in the bilateral upper lobes concerning for mild fibrosis. Prominence of the bronchial markings concerning for bronchitis. No focal consolidation or large pleural effusion. Degenerative changes of the thoracic spine and bilateral glenohumeral osteoarthritis. IMPRESSION: 1. Mild bronchial thickening with coarse interstitial lung markings, representing bronchitis and/or pulmonary vascular congestion. No focal  consolidation or pleural effusion. 2.  Atherosclerotic calcification of aortic arch. Electronically Signed   By: Keane Police D.O.   On: 05/01/2021 16:19        Scheduled Meds:  enoxaparin (LOVENOX) injection  0.5 mg/kg Subcutaneous Q24H   furosemide  40 mg Intravenous Q12H   insulin aspart  0-15 Units Subcutaneous TID WC   Continuous Infusions:   LOS: 2 days    Time spent: 28 minutes    Sharen Hones, MD [facility]   To contact the attending provider between 7A-7P or the covering provider during after hours 7P-7A, please log into the web site [email or web address] and access using universal [place] password for that web site. If you do not have the password, please call the hospital operator.  05/03/2021, 1:06 PM

## 2021-05-03 NOTE — Evaluation (Signed)
Physical Therapy Evaluation Patient Details Name: Karla Sanchez MRN: [id number] DOB: 03/04/1941 Today's Date: 05/03/2021  History of Present Illness  Pt is an 81 y.o. female with medical history significant for HFpEF, colon cancer status post left colectomy, COPD, type 2 diabetes mellitus, diastolic heart failure, gout and hypertension, who presented to the ER with acute onset of diarrhea and associated abdominal pain.  She also been complaining of short of breath and a paroxysmal active dyspnea.  She diagnosed with acute on chronic diastolic congestive heart failure. noted for recent hospitalizations ([name] discharged from there and pt drove to [facility], and a hospital in [place] prior to that per patient).   Clinical Impression  Patient alert, agreeable to PT with encouragement, stated she was very tired and has not slept in two days. Per pt due to recent hospitalizations she has not been back to the hotel that she was living in, in a while. Reported at least 2 falls, one out of the bed at the hotel, one out of her [name]. Normally the patient is able to stand pivot to [facility], non-ambulatory at baseline.  The patient was able to lift all extremities against gravity. Able to come up into long sitting from elevated HOB twice, with use of bed rails. With modA able to transition to EOB. Did not tolerate sitting for longer than 2 minutes, reported SOB and fatigue, and spontaneously return to supine, very light minA for RLE.  She was able to roll R and L with minA, reliance on bed rails for adjusting linens. 2+ assist to reposition in bed for comfort.  Overall the patient demonstrated deficits (see "PT Problem List") that impede the patient's functional abilities, safety, and mobility and would benefit from skilled PT intervention. Recommendation at this time is SNF, but anticipate pt would benefit from transition to LTC due to ongoing hospital admissions and unsafe living situations.           Recommendations for follow up therapy are one component of a multi-disciplinary discharge planning process, led by the attending physician.  Recommendations may be updated based on patient status, additional functional criteria and insurance authorization.  Follow Up Recommendations Skilled nursing-short term rehab (<3 hours/day)    Assistance Recommended at Discharge Frequent or constant Supervision/Assistance  Patient can return home with the following  Two people to help with walking and/or transfers;A lot of help with bathing/dressing/bathroom;Assist for transportation;Help with stairs or ramp for entrance;Assistance with cooking/housework    Equipment Recommendations Other (comment) (TBD at next venue of care)  Recommendations for Other Services       Functional Status Assessment Patient has had a recent decline in their functional status and demonstrates the ability to make significant improvements in function in a reasonable and predictable amount of time.     Precautions / Restrictions Precautions Precautions: Fall Restrictions Weight Bearing Restrictions: No      Mobility  Bed Mobility Overal bed mobility: Needs Assistance Bed Mobility: Supine to Sit, Sit to Supine, Rolling Rolling: Min assist   Supine to sit: Mod assist, HOB elevated Sit to supine: Min assist, HOB elevated   General bed mobility comments: able to roll with reliance on bed rails for linen change.    Transfers                   General transfer comment: pt declined, stated she was too short of breath and tired today    Ambulation/Gait  Stairs            Wheelchair Mobility    Modified Rankin (Stroke Patients Only)       Balance Overall balance assessment: Needs assistance Sitting-balance support: Feet unsupported, Bilateral upper extremity supported Sitting balance-Leahy Scale: Fair Sitting balance - Comments: pt able to come up into long sitting  in bed                                     Pertinent Vitals/Pain Pain Assessment Pain Assessment: Faces Faces Pain Scale: Hurts a little bit Pain Location: R posterior thigh Pain Descriptors / Indicators: Aching, Grimacing Pain Intervention(s): Limited activity within patient's tolerance, Monitored during session, Repositioned    Home Living                     Additional Comments: pt living in a hotel prior to recent hospitalizations. pt reported falling out of bed, and the hospital staff sending her to the hospital    Prior Function               Mobility Comments: able to stand pivot to WC/commode at baseline       Hand Dominance   Dominant Hand: Left    Extremity/Trunk Assessment   Upper Extremity Assessment Upper Extremity Assessment: Generalized weakness (able to lift extremities against gravity)    Lower Extremity Assessment Lower Extremity Assessment: Generalized weakness (able to lift extremities against gravity)    Cervical / Trunk Assessment Cervical / Trunk Assessment: Kyphotic  Communication   Communication: No difficulties  Cognition Arousal/Alertness: Awake/alert Behavior During Therapy: WFL for tasks assessed/performed Overall Cognitive Status: Within Functional Limits for tasks assessed                                          General Comments      Exercises     Assessment/Plan    PT Assessment Patient needs continued PT services  PT Problem List Decreased strength;Decreased mobility;Decreased range of motion;Decreased activity tolerance;Decreased balance;Decreased knowledge of use of DME;Cardiopulmonary status limiting activity       PT Treatment Interventions DME instruction;Therapeutic exercise;Gait training;Balance training;Stair training;Neuromuscular re-education;Functional mobility training;Patient/family education;Therapeutic activities    PT Goals (Current goals can be found in the  Care Plan section)  Acute Rehab PT Goals Patient Stated Goal: to breathe better, feel better PT Goal Formulation: With patient Time For Goal Achievement: 05/17/21 Potential to Achieve Goals: Fair    Frequency Min 2X/week     Co-evaluation               AM-PAC PT "6 Clicks" Mobility  Outcome Measure Help needed turning from your back to your side while in a flat bed without using bedrails?: A Lot Help needed moving from lying on your back to sitting on the side of a flat bed without using bedrails?: A Lot Help needed moving to and from a bed to a chair (including a wheelchair)?: A Lot Help needed standing up from a chair using your arms (e.g., wheelchair or bedside chair)?: A Lot Help needed to walk in hospital room?: Total Help needed climbing 3-5 steps with a railing? : Total 6 Click Score: 10    End of Session   Activity Tolerance: Other (comment);Patient limited by fatigue (pt appearing self limiting)  Patient left: in bed;with call bell/phone within reach;with bed alarm set Nurse Communication: Mobility status PT Visit Diagnosis: Other abnormalities of gait and mobility (R26.89);Difficulty in walking, not elsewhere classified (R26.2);Muscle weakness (generalized) (M62.81)    Time: 0230-1720 PT Time Calculation (min) (ACUTE ONLY): 24 min   Charges:   PT Evaluation $PT Eval Low Complexity: 1 Low PT Treatments $Therapeutic Activity: 8-22 mins       Lieutenant Diego PT, DPT 12:39 PM,05/03/21

## 2021-05-04 DIAGNOSIS — N3000 Acute cystitis without hematuria: Secondary | ICD-10-CM | POA: Diagnosis not present

## 2021-05-04 DIAGNOSIS — I5031 Acute diastolic (congestive) heart failure: Secondary | ICD-10-CM | POA: Diagnosis not present

## 2021-05-04 DIAGNOSIS — N1831 Chronic kidney disease, stage 3a: Secondary | ICD-10-CM | POA: Diagnosis not present

## 2021-05-04 LAB — BASIC METABOLIC PANEL
Anion gap: 8 (ref 5–15)
BUN: 30 mg/dL — ABNORMAL HIGH (ref 8–23)
CO2: 31 mmol/L (ref 22–32)
Calcium: 7.8 mg/dL — ABNORMAL LOW (ref 8.9–10.3)
Chloride: 102 mmol/L (ref 98–111)
Creatinine, Ser: 1.08 mg/dL — ABNORMAL HIGH (ref 0.44–1.00)
GFR, Estimated: 52 mL/min — ABNORMAL LOW (ref >60)
Glucose, Bld: 152 mg/dL — ABNORMAL HIGH (ref 70–99)
Potassium: 4.2 mmol/L (ref 3.5–5.1)
Sodium: 141 mmol/L (ref 135–145)

## 2021-05-04 LAB — GLUCOSE, CAPILLARY
Glucose-Capillary: 134 mg/dL — ABNORMAL HIGH (ref 70–99)
Glucose-Capillary: 177 mg/dL — ABNORMAL HIGH (ref 70–99)
Glucose-Capillary: 203 mg/dL — ABNORMAL HIGH (ref 70–99)
Glucose-Capillary: 190 mg/dL — ABNORMAL HIGH (ref 70–99)

## 2021-05-04 LAB — MAGNESIUM: Magnesium: 1.6 mg/dL — ABNORMAL LOW (ref 1.7–2.4)

## 2021-05-04 MED ORDER — FUROSEMIDE 10 MG/ML IJ SOLN
INTRAMUSCULAR | Status: AC
  Filled 2021-05-04: qty 2

## 2021-05-04 NOTE — Progress Notes (Signed)
PROGRESS NOTE    Karla Sanchez [facility]  FIE:[id number] DOB: Jan 26, 1941 DOA: 05/01/2021 PCP: Alfredia Client, MD    Brief Narrative:  Karla Sanchez is a 81 y.o. female with medical history significant for HFpEF, colon cancer status post left colectomy, COPD, type 2 diabetes mellitus, diastolic heart failure, gout and hypertension, who presented to the ER with acute onset of diarrhea and associated abdominal pain.  She also been complaining of short of breath and a paroxysmal active dyspnea.  She diagnosed with acute on chronic diastolic congestive heart failure, started on IV Lasix.   Assessment & Plan:   Active Problems:   COPD (chronic obstructive pulmonary disease) (HCC)   Obesity, Class III, BMI 40-49.9 (morbid obesity) ([place])   Hypertension   Chronic respiratory failure with hypoxia (HCC)   Type 2 diabetes mellitus without complication, with long-term current use of insulin (HCC)   Physical deconditioning   Acute CHF (congestive heart failure) ([place])   Gastroenteritis due to COVID-19 virus   Acute cystitis   Chronic kidney disease, stage 3a (HCC)   Acute on chronic diastolic CHF (congestive heart failure) (HCC) Echocardiogram performed in March 2022 showed ejection fraction 55 to 60% with grade 1 diastolic dysfunction.  Condition improving, renal function still getting better.  We will continue another day of IV Lasix.   Chronic kidney disease, stage 3a (HCC) Stable.   Acute cystitis Patient has received a dose of fosfomycin, antibiotics completed.   Gastroenteritis due to COVID-19 virus Condition has improved.  No additional diarrhea.   Physical deconditioning- (present on admission) Patient is evaluated by physical therapy, recommended nursing home placement.   Chronic respiratory failure with hypoxia ([place])- (present on admission) Continue oxygen treatment.   Hypertension- (present on admission) Continue home medicines.   Obesity, Class III, BMI  40-49.9 (morbid obesity) ([place])- (present on admission) Patient has morbid obesity with a BMI of 48.7.     COPD (chronic obstructive pulmonary disease) ([place])- (present on admission) Stable.   Type 2 diabetes Continue sliding scale insulin.     DVT prophylaxis: Lovenox Code Status: DNR Family Communication:  Disposition Plan: Pending nursing home placement.     Status is: Inpatient Remains inpatient appropriate because: Severity of disease, IV Lasix.            I/O last 3 completed shifts: In: 26 [P.O.:720] Out: 2850 [Urine:2850] Total I/O In: 0  Out: 450 [Urine:450]      Subjective: Patient slept well last night.  Short of breath much improved today. Denies any abdominal pain or nausea vomiting.  Objective: Vitals:   05/04/21 0000 05/04/21 0400 05/04/21 0500 05/04/21 0736  BP: (!) 189/75 (!) 179/71  (!) 155/74  Pulse: 68 62  65  Resp: 16 18  20   Temp: 98.4 F (36.9 C) 97.9 F (36.6 C)  98 F (36.7 C)  TempSrc: Oral Oral    SpO2: 98% 98%  96%  Weight:   111.3 kg   Height:        Intake/Output Summary (Last 24 hours) at 05/04/2021 1235 Last data filed at 05/04/2021 0800 Gross per 24 hour  Intake 240 ml  Output 800 ml  Net -560 ml   Filed Weights   05/01/21 1521 05/03/21 0500 05/04/21 0500  Weight: 113.7 kg 111.4 kg 111.3 kg    Examination:  General exam: Appears calm and comfortable  Respiratory system: Clear to auscultation. Respiratory effort normal. Cardiovascular system: S1 & S2 heard, RRR. No JVD, murmurs, rubs, gallops or clicks.  No pedal edema. Gastrointestinal system: Abdomen is nondistended, soft and nontender. No organomegaly or masses felt. Normal bowel sounds heard. Central nervous system: Alert and oriented. No focal neurological deficits. Extremities: Symmetric 5 x 5 power. Skin: No rashes, lesions or ulcers Psychiatry: Judgement and insight appear normal. Mood & affect appropriate.     Data Reviewed: I have personally  reviewed following labs and imaging studies  CBC: Recent Labs  Lab 05/01/21 1701 05/02/21 0816  WBC 6.6 7.9  HGB 11.5* 11.6*  HCT 37.1 37.0  MCV 85.5 85.5  PLT 304 983   Basic Metabolic Panel: Recent Labs  Lab 05/01/21 1701 05/02/21 0816 05/03/21 0547 05/04/21 0434  NA 137 139 143 141  K 4.9 4.3 4.4 4.2  CL 103 104 105 102  CO2 25 29 30 31   GLUCOSE 372* 168* 137* 152*  BUN 47* 43* 34* 30*  CREATININE 1.44* 1.18* 1.13* 1.08*  CALCIUM 7.7* 7.5* 7.9* 7.8*  MG  --   --  1.6* 1.6*   GFR: Estimated Creatinine Clearance: 52.5 mL/min (A) (by C-G formula based on SCr of 1.08 mg/dL (H)). Liver Function Tests: Recent Labs  Lab 05/01/21 1701  AST 22  ALT 18  ALKPHOS 87  BILITOT 0.3  PROT 5.8*  ALBUMIN 2.6*   Recent Labs  Lab 05/01/21 1701  LIPASE 64*   No results for input(s): AMMONIA in the last 168 hours. Coagulation Profile: No results for input(s): INR, PROTIME in the last 168 hours. Cardiac Enzymes: No results for input(s): CKTOTAL, CKMB, CKMBINDEX, TROPONINI in the last 168 hours. BNP (last 3 results) No results for input(s): PROBNP in the last 8760 hours. HbA1C: No results for input(s): HGBA1C in the last 72 hours. CBG: Recent Labs  Lab 05/03/21 1115 05/03/21 1647 05/03/21 2003 05/04/21 0737 05/04/21 1213  GLUCAP 178* 117* 210* 134* 177*   Lipid Profile: No results for input(s): CHOL, HDL, LDLCALC, TRIG, CHOLHDL, LDLDIRECT in the last 72 hours. Thyroid Function Tests: No results for input(s): TSH, T4TOTAL, FREET4, T3FREE, THYROIDAB in the last 72 hours. Anemia Panel: No results for input(s): VITAMINB12, FOLATE, FERRITIN, TIBC, IRON, RETICCTPCT in the last 72 hours. Sepsis Labs: No results for input(s): PROCALCITON, LATICACIDVEN in the last 168 hours.  Recent Results (from the past 240 hour(s))  Urine Culture     Status: Abnormal   Collection Time: 05/01/21  6:23 PM   Specimen: Urine, Random  Result Value Ref Range Status   Specimen  Description   Final    URINE, RANDOM Performed at [facility], [address], [place], [place] [address]    Special Requests   Final    NONE Performed at [facility], [address] [place], [place] [address]    Culture (A)  Final    <10,000 COLONIES/mL INSIGNIFICANT GROWTH Performed at [facility] [address], [place], [place] [address]    Report Status 05/03/2021 FINAL  Final  Resp Panel by RT-PCR (Flu A&B, Covid) Nasopharyngeal Swab     Status: Abnormal   Collection Time: 05/01/21 10:08 PM   Specimen: Nasopharyngeal Swab; Nasopharyngeal(NP) swabs in vial transport medium  Result Value Ref Range Status   SARS Coronavirus 2 by RT PCR POSITIVE (A) NEGATIVE Final    Comment: (NOTE) SARS-CoV-2 target nucleic acids are DETECTED.  The SARS-CoV-2 RNA is generally detectable in upper respiratory specimens during the acute phase of infection. Positive results are indicative of the presence of the identified virus, but do not rule out  bacterial infection or co-infection with other pathogens not detected by the test. Clinical correlation with patient history and other diagnostic information is necessary to determine patient infection status. The expected result is Negative.  Fact Sheet for Patients: EntrepreneurPulse.com.au  Fact Sheet for Healthcare Providers: IncredibleEmployment.be  This test is not yet approved or cleared by the [place] FDA and  has been authorized for detection and/or diagnosis of SARS-CoV-2 by FDA under an Emergency Use Authorization (EUA).  This EUA will remain in effect (meaning this test can be used) for the duration of  the COVID-19 declaration under Section 564(b)(1) of the A ct, 21 U.S.C. section 360bbb-3(b)(1), unless the authorization is terminated or revoked sooner.     Influenza A by PCR NEGATIVE NEGATIVE Final   Influenza B by PCR NEGATIVE NEGATIVE Final    Comment:  (NOTE) The Xpert Xpress SARS-CoV-2/FLU/RSV plus assay is intended as an aid in the diagnosis of influenza from Nasopharyngeal swab specimens and should not be used as a sole basis for treatment. Nasal washings and aspirates are unacceptable for Xpert Xpress SARS-CoV-2/FLU/RSV testing.  Fact Sheet for Patients: EntrepreneurPulse.com.au  Fact Sheet for Healthcare Providers: IncredibleEmployment.be  This test is not yet approved or cleared by the [place] FDA and has been authorized for detection and/or diagnosis of SARS-CoV-2 by FDA under an Emergency Use Authorization (EUA). This EUA will remain in effect (meaning this test can be used) for the duration of the COVID-19 declaration under Section 564(b)(1) of the Act, 21 U.S.C. section 360bbb-3(b)(1), unless the authorization is terminated or revoked.  Performed at [facility], [address] [place], [place] [address]          Radiology Studies: No results found.      Scheduled Meds:  enoxaparin (LOVENOX) injection  0.5 mg/kg Subcutaneous Q24H   furosemide  40 mg Intravenous Q12H   insulin aspart  0-15 Units Subcutaneous TID WC   Continuous Infusions:   LOS: 3 days    Time spent: 23 minutes    Sharen Hones, MD [facility]   To contact the attending provider between 7A-7P or the covering provider during after hours 7P-7A, please log into the web site [email or web address] and access using universal [place] password for that web site. If you do not have the password, please call the hospital operator.  05/04/2021, 12:35 PM

## 2021-05-05 DIAGNOSIS — N3000 Acute cystitis without hematuria: Secondary | ICD-10-CM | POA: Diagnosis not present

## 2021-05-05 DIAGNOSIS — J9611 Chronic respiratory failure with hypoxia: Secondary | ICD-10-CM | POA: Diagnosis not present

## 2021-05-05 DIAGNOSIS — I5031 Acute diastolic (congestive) heart failure: Secondary | ICD-10-CM | POA: Diagnosis not present

## 2021-05-05 LAB — CBC
HCT: 38.3 % (ref 36.0–46.0)
Hemoglobin: 11.5 g/dL — ABNORMAL LOW (ref 12.0–15.0)
MCH: 26.2 pg (ref 26.0–34.0)
MCHC: 30 g/dL (ref 30.0–36.0)
MCV: 87.2 fL (ref 80.0–100.0)
Platelets: 255 10*3/uL (ref 150–400)
RBC: 4.39 MIL/uL (ref 3.87–5.11)
RDW: 15.7 % — ABNORMAL HIGH (ref 11.5–15.5)
WBC: 8.6 10*3/uL (ref 4.0–10.5)
nRBC: 0 % (ref 0.0–0.2)

## 2021-05-05 LAB — BASIC METABOLIC PANEL
Anion gap: 10 (ref 5–15)
BUN: 27 mg/dL — ABNORMAL HIGH (ref 8–23)
CO2: 31 mmol/L (ref 22–32)
Calcium: 8.1 mg/dL — ABNORMAL LOW (ref 8.9–10.3)
Chloride: 100 mmol/L (ref 98–111)
Creatinine, Ser: 0.93 mg/dL (ref 0.44–1.00)
GFR, Estimated: >60 mL/min (ref >60)
Glucose, Bld: 151 mg/dL — ABNORMAL HIGH (ref 70–99)
Potassium: 4.1 mmol/L (ref 3.5–5.1)
Sodium: 141 mmol/L (ref 135–145)

## 2021-05-05 LAB — MAGNESIUM: Magnesium: 1.6 mg/dL — ABNORMAL LOW (ref 1.7–2.4)

## 2021-05-05 LAB — GLUCOSE, CAPILLARY
Glucose-Capillary: 159 mg/dL — ABNORMAL HIGH (ref 70–99)
Glucose-Capillary: 157 mg/dL — ABNORMAL HIGH (ref 70–99)
Glucose-Capillary: 171 mg/dL — ABNORMAL HIGH (ref 70–99)
Glucose-Capillary: 145 mg/dL — ABNORMAL HIGH (ref 70–99)

## 2021-05-05 MED ORDER — MAGNESIUM SULFATE 4 GM/100ML IV SOLN
4.0000 g | Freq: Once | INTRAVENOUS | Status: AC
  Administered 2021-05-05: 4 g via INTRAVENOUS
  Filled 2021-05-05: qty 100

## 2021-05-05 MED ORDER — SENNOSIDES-DOCUSATE SODIUM 8.6-50 MG PO TABS
2.0000 | ORAL_TABLET | Freq: Two times a day (BID) | ORAL | Status: DC
  Administered 2021-05-05 – 2021-05-11 (×8): 2 via ORAL
  Filled 2021-05-05 (×9): qty 2

## 2021-05-05 MED ORDER — LACTULOSE 10 GM/15ML PO SOLN
20.0000 g | Freq: Once | ORAL | Status: AC
  Administered 2021-05-05: 20 g via ORAL
  Filled 2021-05-05: qty 30

## 2021-05-05 NOTE — Plan of Care (Signed)

## 2021-05-05 NOTE — TOC Progression Note (Addendum)
Transition of Care [facility]) - Progression Note    Patient Details  Name: Sharilynn Cassity MRN: [id number] Date of Birth: 27-Apr-1940  Transition of Care [facility]) CM/SW [place], [place] Phone Number: 05/05/2021, 9:03 AM  Clinical Narrative:     Update: Patient reports she does not want any of her current bed offers, as she only wants [place] facilities and she has no bed offers from [place]. Patient drove from [facility] after being discharged to here, she reports she will just drive back to [facility] once discharged since she has no [place] bed offers.  Patient declines the 5 bed offers she currently has.      CSW notes patient does have bed offers, patient tested positive 2/8 at [facility], and 2/9 at [facility] for [place].   CSW has reached out to bed offers to inquire if 10 day quarantine is still protocol, at this time all facilities have indicated patient would need to stay until 2/18 and dc 2/19 on day 11 of quarantine.   Expected Discharge Plan: [place] Barriers to Discharge: Continued Medical Work up  Expected Discharge Plan and Services Expected Discharge Plan: [place]   Discharge Planning Services: CM Consult Post Acute Care Choice: [place] Living arrangements for the past 2 months: Hotel/Motel                 DME Arranged: N/A DME Agency: NA                   Social Determinants of Health (SDOH) Interventions    Readmission Risk Interventions Readmission Risk Prevention Plan 06/10/2020 10/24/2019  Transportation Screening Complete -  Medication Review Press photographer) Complete Complete  PCP or Specialist appointment within 3-5 days of discharge Complete -  [place] or Home Care Consult Complete Complete  SW Recovery Care/Counseling Consult Complete -  Palliative Care Screening Not Applicable -  [place] Complete Patient Refused  Some recent data might be hidden

## 2021-05-05 NOTE — Progress Notes (Addendum)
PROGRESS NOTE    Karla Sanchez  ZGY:[id number] DOB: 11-02-1940 DOA: 05/01/2021 PCP: Alfredia Client, MD   Chief complaint shortness of breath. Brief Narrative:   Karla Sanchez is a 81 y.o. female with medical history significant for HFpEF, colon cancer status post left colectomy, COPD, type 2 diabetes mellitus, diastolic heart failure, gout and hypertension, who presented to the ER with acute onset of diarrhea and associated abdominal pain.  She also been complaining of short of breath and a paroxysmal active dyspnea.  She diagnosed with acute on chronic diastolic congestive heart failure, started on IV Lasix.  Assessment & Plan:   Active Problems:   COPD (chronic obstructive pulmonary disease) (HCC)   Obesity, Class III, BMI 40-49.9 (morbid obesity) ([place])   Hypertension   Chronic respiratory failure with hypoxia (HCC)   Type 2 diabetes mellitus without complication, with long-term current use of insulin (HCC)   Physical deconditioning   Acute CHF (congestive heart failure) ([place])   Gastroenteritis due to COVID-19 virus   Acute cystitis   Chronic kidney disease, stage 3a (HCC)   Hypomagnesemia  Acute on chronic diastolic CHF (congestive heart failure) (HCC) Echocardiogram performed in March 2022 showed ejection fraction 55 to 60% with grade 1 diastolic dysfunction.   Patient is continued on IV Lasix, she has not made a significant amount of urine which was not recorded.  Renal function continues to improve, will require another day of IV Lasix.  Chronic kidney disease, stage 3a ([place]) Hypomagnesemia Patient has been receiving 2 g of magnesium sulfate daily, magnesium level still 1.6, will give 4 g of magnesium today.  Constipation  No bowel movement for the last 3 days, will give a dose of lactulose continue senna scheduled.   Acute cystitis Patient has received a dose of fosfomycin, antibiotics completed.   Gastroenteritis due to COVID-19  virus Condition has improved.  No additional diarrhea.   Physical deconditioning- (present on admission) Patient is evaluated by physical therapy, recommended nursing home placement.   Chronic respiratory failure with hypoxia ([place])- (present on admission) Continue oxygen treatment.   Hypertension- (present on admission) Continue home medicines.   Obesity, Class III, BMI 40-49.9 (morbid obesity) ([place])- (present on admission) Patient has morbid obesity with a BMI of 48.7.     COPD (chronic obstructive pulmonary disease) ([place])- (present on admission) Stable.   Type 2 diabetes uncontrolled with hyperglycemia. Continue sliding scale insulin. HbA1C 7.4    DVT prophylaxis: Lovenox Code Status: DNR Family Communication: Pending SNF. Disposition Plan:    Status is: Inpatient Remains inpatient appropriate because: IV Lasix for congestive heart failure, unsafe discharge.   I/O last 3 completed shifts: In: 720 [P.O.:720] Out: 800 [Urine:800] No intake/output data recorded.     Consultants:  None  Procedures: None  Antimicrobials: None  Subjective: Patient condition much improved today, she slept well last night.  Short of breath with exertion still, no paroxysmal active dyspnea. Leg edema resolved. Still constipated, no bowel movement for the last 3 days.  Objective: Vitals:   05/04/21 2000 05/05/21 0045 05/05/21 0300 05/05/21 0827  BP: (!) 149/81 138/76  (!) 162/52  Pulse: 63 68  62  Resp: 20 19  18   Temp: 98.1 F (36.7 C) 98.6 F (37 C)  97.7 F (36.5 C)  TempSrc: Oral Oral    SpO2: 94%   100%  Weight:   110.6 kg   Height:        Intake/Output Summary (Last 24 hours) at 05/05/2021 [phone]  Last data filed at 05/04/2021 1700 Gross per 24 hour  Intake 720 ml  Output --  Net 720 ml   Filed Weights   05/03/21 0500 05/04/21 0500 05/05/21 0300  Weight: 111.4 kg 111.3 kg 110.6 kg    Examination:  General exam: Appears calm and comfortable  Respiratory  system: Clear to auscultation. Respiratory effort normal. Cardiovascular system: S1 & S2 heard, RRR. No JVD, murmurs, rubs, gallops or clicks. No pedal edema. Gastrointestinal system: Abdomen is nondistended, soft and nontender. No organomegaly or masses felt. Normal bowel sounds heard. Central nervous system: Alert and oriented. No focal neurological deficits. Extremities: Symmetric 5 x 5 power. Skin: No rashes, lesions or ulcers Psychiatry: Judgement and insight appear normal. Mood & affect appropriate.     Data Reviewed: I have personally reviewed following labs and imaging studies  CBC: Recent Labs  Lab 05/01/21 1701 05/02/21 0816  WBC 6.6 7.9  HGB 11.5* 11.6*  HCT 37.1 37.0  MCV 85.5 85.5  PLT 304 161   Basic Metabolic Panel: Recent Labs  Lab 05/01/21 1701 05/02/21 0816 05/03/21 0547 05/04/21 0434 05/05/21 0529  NA 137 139 143 141 141  K 4.9 4.3 4.4 4.2 4.1  CL 103 104 105 102 100  CO2 25 29 30 31 31   GLUCOSE 372* 168* 137* 152* 151*  BUN 47* 43* 34* 30* 27*  CREATININE 1.44* 1.18* 1.13* 1.08* 0.93  CALCIUM 7.7* 7.5* 7.9* 7.8* 8.1*  MG  --   --  1.6* 1.6* 1.6*   GFR: Estimated Creatinine Clearance: 60.8 mL/min (by C-G formula based on SCr of 0.93 mg/dL). Liver Function Tests: Recent Labs  Lab 05/01/21 1701  AST 22  ALT 18  ALKPHOS 87  BILITOT 0.3  PROT 5.8*  ALBUMIN 2.6*   Recent Labs  Lab 05/01/21 1701  LIPASE 64*   No results for input(s): AMMONIA in the last 168 hours. Coagulation Profile: No results for input(s): INR, PROTIME in the last 168 hours. Cardiac Enzymes: No results for input(s): CKTOTAL, CKMB, CKMBINDEX, TROPONINI in the last 168 hours. BNP (last 3 results) No results for input(s): PROBNP in the last 8760 hours. HbA1C: No results for input(s): HGBA1C in the last 72 hours. CBG: Recent Labs  Lab 05/04/21 0737 05/04/21 1213 05/04/21 1609 05/04/21 2059 05/05/21 0825  GLUCAP 134* 177* 203* 190* 159*   Lipid Profile: No  results for input(s): CHOL, HDL, LDLCALC, TRIG, CHOLHDL, LDLDIRECT in the last 72 hours. Thyroid Function Tests: No results for input(s): TSH, T4TOTAL, FREET4, T3FREE, THYROIDAB in the last 72 hours. Anemia Panel: No results for input(s): VITAMINB12, FOLATE, FERRITIN, TIBC, IRON, RETICCTPCT in the last 72 hours. Sepsis Labs: No results for input(s): PROCALCITON, LATICACIDVEN in the last 168 hours.  Recent Results (from the past 240 hour(s))  Urine Culture     Status: Abnormal   Collection Time: 05/01/21  6:23 PM   Specimen: Urine, Random  Result Value Ref Range Status   Specimen Description   Final    URINE, RANDOM Performed at [facility], [address] [place], [place] [address]    Special Requests   Final    NONE Performed at [facility], [address] [place], [place] [address]    Culture (A)  Final    <10,000 COLONIES/mL INSIGNIFICANT GROWTH Performed at [facility] [address], [place], [place] [address]    Report Status 05/03/2021 FINAL  Final  Resp Panel by RT-PCR (Flu A&B, Covid) Nasopharyngeal Swab  Status: Abnormal   Collection Time: 05/01/21 10:08 PM   Specimen: Nasopharyngeal Swab; Nasopharyngeal(NP) swabs in vial transport medium  Result Value Ref Range Status   SARS Coronavirus 2 by RT PCR POSITIVE (A) NEGATIVE Final    Comment: (NOTE) SARS-CoV-2 target nucleic acids are DETECTED.  The SARS-CoV-2 RNA is generally detectable in upper respiratory specimens during the acute phase of infection. Positive results are indicative of the presence of the identified virus, but do not rule out bacterial infection or co-infection with other pathogens not detected by the test. Clinical correlation with patient history and other diagnostic information is necessary to determine patient infection status. The expected result is Negative.  Fact Sheet for Patients: EntrepreneurPulse.com.au  Fact Sheet for Healthcare  Providers: IncredibleEmployment.be  This test is not yet approved or cleared by the [place] FDA and  has been authorized for detection and/or diagnosis of SARS-CoV-2 by FDA under an Emergency Use Authorization (EUA).  This EUA will remain in effect (meaning this test can be used) for the duration of  the COVID-19 declaration under Section 564(b)(1) of the A ct, 21 U.S.C. section 360bbb-3(b)(1), unless the authorization is terminated or revoked sooner.     Influenza A by PCR NEGATIVE NEGATIVE Final   Influenza B by PCR NEGATIVE NEGATIVE Final    Comment: (NOTE) The Xpert Xpress SARS-CoV-2/FLU/RSV plus assay is intended as an aid in the diagnosis of influenza from Nasopharyngeal swab specimens and should not be used as a sole basis for treatment. Nasal washings and aspirates are unacceptable for Xpert Xpress SARS-CoV-2/FLU/RSV testing.  Fact Sheet for Patients: EntrepreneurPulse.com.au  Fact Sheet for Healthcare Providers: IncredibleEmployment.be  This test is not yet approved or cleared by the [place] FDA and has been authorized for detection and/or diagnosis of SARS-CoV-2 by FDA under an Emergency Use Authorization (EUA). This EUA will remain in effect (meaning this test can be used) for the duration of the COVID-19 declaration under Section 564(b)(1) of the Act, 21 U.S.C. section 360bbb-3(b)(1), unless the authorization is terminated or revoked.  Performed at [facility], [address], [place], [place] [address]          Radiology Studies: No results found.      Scheduled Meds:  enoxaparin (LOVENOX) injection  0.5 mg/kg Subcutaneous Q24H   furosemide       furosemide  40 mg Intravenous Q12H   insulin aspart  0-15 Units Subcutaneous TID WC   Continuous Infusions:  magnesium sulfate bolus IVPB       LOS: 4 days    Time spent: 27 minutes    Sharen Hones, MD [facility]   To contact the attending provider between 7A-7P or the covering provider during after hours 7P-7A, please log into the web site [email or web address] and access using universal [place] password for that web site. If you do not have the password, please call the hospital operator.  05/05/2021, 9:46 AM

## 2021-05-06 DIAGNOSIS — I5031 Acute diastolic (congestive) heart failure: Secondary | ICD-10-CM | POA: Diagnosis not present

## 2021-05-06 DIAGNOSIS — N1831 Chronic kidney disease, stage 3a: Secondary | ICD-10-CM | POA: Diagnosis not present

## 2021-05-06 DIAGNOSIS — N3000 Acute cystitis without hematuria: Secondary | ICD-10-CM | POA: Diagnosis not present

## 2021-05-06 LAB — GLUCOSE, CAPILLARY
Glucose-Capillary: 169 mg/dL — ABNORMAL HIGH (ref 70–99)
Glucose-Capillary: 178 mg/dL — ABNORMAL HIGH (ref 70–99)
Glucose-Capillary: 113 mg/dL — ABNORMAL HIGH (ref 70–99)
Glucose-Capillary: 203 mg/dL — ABNORMAL HIGH (ref 70–99)

## 2021-05-06 LAB — BASIC METABOLIC PANEL
Anion gap: 9 (ref 5–15)
BUN: 29 mg/dL — ABNORMAL HIGH (ref 8–23)
CO2: 30 mmol/L (ref 22–32)
Calcium: 8.2 mg/dL — ABNORMAL LOW (ref 8.9–10.3)
Chloride: 98 mmol/L (ref 98–111)
Creatinine, Ser: 1.1 mg/dL — ABNORMAL HIGH (ref 0.44–1.00)
GFR, Estimated: 51 mL/min — ABNORMAL LOW (ref >60)
Glucose, Bld: 220 mg/dL — ABNORMAL HIGH (ref 70–99)
Potassium: 4.4 mmol/L (ref 3.5–5.1)
Sodium: 137 mmol/L (ref 135–145)

## 2021-05-06 LAB — MAGNESIUM: Magnesium: 2.2 mg/dL (ref 1.7–2.4)

## 2021-05-06 MED ORDER — FUROSEMIDE 40 MG PO TABS
40.0000 mg | ORAL_TABLET | Freq: Every day | ORAL | Status: DC
  Administered 2021-05-07 – 2021-05-12 (×6): 40 mg via ORAL
  Filled 2021-05-06 (×6): qty 1

## 2021-05-06 NOTE — Progress Notes (Signed)
Occupational Therapy Treatment Patient Details Name: Kristien Salatino MRN: [id number] DOB: Mar 01, 1941 Today's Date: 05/06/2021   History of present illness Pt is an 81 y.o. female with medical history significant for HFpEF, colon cancer status post left colectomy, COPD, type 2 diabetes mellitus, diastolic heart failure, gout and hypertension, who presented to the ER with acute onset of diarrhea and associated abdominal pain.  She also been complaining of short of breath and a paroxysmal active dyspnea.  She diagnosed with acute on chronic diastolic congestive heart failure. noted for recent hospitalizations ([name] discharged from there and pt drove to [facility], and a hospital in [place] prior to that per patient).   OT comments  Pt seen for OT/PT co-treatment on this date. Upon arrival to room, pt awake and seated upright in bed. Pt initially declined to participate in OOB mobility, however was agreeable following MOD encouragement from this author. At start of session, pt's clothing and bed linens were observed to be soiled in urine. Pt performed bed mobility MOD-I, performed sit>stand transfer with MIN A, and walked ~5 steps bed>recliner with RW and MIN GUARD. Once seated, pt performed UB dressing with SET-UP assist and sit>stand LB bathing with MOD A. Pt participated in seated LE therapy exercises and x2 additional sit>stand transfers before reporting fatigue. Pt left sitting upright in recliner, with all needs within reach and in no acute distress. Pt is making good progress toward goals and continues to benefit from skilled OT services to maximize return to PLOF and minimize risk of future falls, injury, caregiver burden, and readmission. Will continue to follow POC. Discharge recommendation remains appropriate.     Recommendations for follow up therapy are one component of a multi-disciplinary discharge planning process, led by the attending physician.  Recommendations may be updated based on  patient status, additional functional criteria and insurance authorization.    Follow Up Recommendations  Skilled nursing-short term rehab (<3 hours/day)    Assistance Recommended at Discharge Intermittent Supervision/Assistance  Patient can return home with the following  A little help with walking and/or transfers;A lot of help with bathing/dressing/bathroom;Assistance with cooking/housework;Direct supervision/assist for medications management   Equipment Recommendations  Other (comment) (defer to next level of care)       Precautions / Restrictions Precautions Precautions: Fall Restrictions Weight Bearing Restrictions: No       Mobility Bed Mobility Overal bed mobility: Modified Independent       Supine to sit: Modified independent (Device/Increase time), HOB elevated     General bed mobility comments: with HOB elevated and use of bedrails, required no physical assistance    Transfers Overall transfer level: Needs assistance Equipment used: Rolling walker (2 wheels) Transfers: Sit to/from Stand Sit to Stand: Min assist, Min guard           General transfer comment: Initially required MIN A from bed, but progressed to MIN GUARD during x2 bouts from recliner     Balance Overall balance assessment: Needs assistance Sitting-balance support: Feet unsupported, Bilateral upper extremity supported Sitting balance-Leahy Scale: Good     Standing balance support: Bilateral upper extremity supported, During functional activity, Reliant on assistive device for balance Standing balance-Leahy Scale: Fair Standing balance comment: to take ~5 steps bed>recliner                           ADL either performed or assessed with clinical judgement   ADL Overall ADL's : Needs assistance/impaired  Grooming: Dance movement psychotherapist;Set up;Sitting       Lower Body Bathing: Moderate assistance;Sit to/from stand Lower Body Bathing Details (indicate cue type and reason):  Requires MOD A for thoroughness in setting of fatigue Upper Body Dressing : Set up;Sitting Upper Body Dressing Details (indicate cue type and reason): to don/doff hospital gown                          Cognition Arousal/Alertness: Awake/alert Behavior During Therapy: WFL for tasks assessed/performed Overall Cognitive Status: No family/caregiver present to determine baseline cognitive functioning                                 General Comments: Pt A&Ox4. Requires significant encouragement to attempt OOB mobility                   Pertinent Vitals/ Pain       Pain Assessment Pain Assessment: No/denies pain         Frequency  Min 2X/week        Progress Toward Goals  OT Goals(current goals can now be found in the care plan section)  Progress towards OT goals: Progressing toward goals  Acute Rehab OT Goals Patient Stated Goal: to return home OT Goal Formulation: With patient Time For Goal Achievement: 05/17/21 Potential to Achieve Goals: [place] Discharge plan remains appropriate;Frequency remains appropriate    Co-evaluation    PT/OT/SLP Co-Evaluation/Treatment: Yes Reason for Co-Treatment: For patient/therapist safety;To address functional/ADL transfers PT goals addressed during session: Mobility/safety with mobility;Balance OT goals addressed during session: ADL's and self-care      AM-PAC OT "6 Clicks" Daily Activity     Outcome Measure   Help from another person eating meals?: None Help from another person taking care of personal grooming?: A Little Help from another person toileting, which includes using toliet, bedpan, or urinal?: A Lot Help from another person bathing (including washing, rinsing, drying)?: A Lot Help from another person to put on and taking off regular upper body clothing?: A Little Help from another person to put on and taking off regular lower body clothing?: A Lot 6 Click Score: 16    End of  Session Equipment Utilized During Treatment: Gait belt;Rolling walker (2 wheels)  OT Visit Diagnosis: Muscle weakness (generalized) (M62.81)   Activity Tolerance Patient tolerated treatment well   Patient Left in chair;with call bell/phone within reach;with chair alarm set;with nursing/sitter in room   Nurse Communication Mobility status        Time: 1505-6979 OT Time Calculation (min): 23 min  Charges: OT General Charges $OT Visit: 1 Visit OT Treatments $Self Care/Home Management : 8-22 mins  Fredirick Maudlin, OTR/L [place]

## 2021-05-06 NOTE — Plan of Care (Signed)

## 2021-05-06 NOTE — Progress Notes (Signed)
Physical Therapy Treatment Patient Details Name: Karla Sanchez MRN: [id number] DOB: 1940-07-10 Today's Date: 05/06/2021   History of Present Illness Pt is an 81 y.o. female with medical history significant for HFpEF, colon cancer status post left colectomy, COPD, type 2 diabetes mellitus, diastolic heart failure, gout and hypertension, who presented to the ER with acute onset of diarrhea and associated abdominal pain.  She also been complaining of short of breath and a paroxysmal active dyspnea.  She diagnosed with acute on chronic diastolic congestive heart failure. noted for recent hospitalizations ([name] discharged from there and pt drove to [facility], and a hospital in [place] prior to that per patient).    PT Comments    Patient tolerated session well and was agreeable to co-treatment with OT. Patient demonstrated increased ability to perform bed and functional mobility in today's session. Patient reported no pain throughout session, however does fatigue rather quickly and requires frequent seated recovery breaks. Patient was able to demonstrate bed mobility at Mod I, requiring UE support from bed rail. Sit to stands throughout session progressed from [place] A to [place] with increased repetitions. Patient was also able to demonstrate 5 steps from EOB to recliner at [facility]. Therapeutic exercise in recliner focused on BLE strengthening. Patient continue to benefit from skilled physical therapy in order to optimize patient's return to PLOF. Continue to recommend STR following acute hospitalization. Patient left in recliner with all needs met and RN present.   Recommendations for follow up therapy are one component of a multi-disciplinary discharge planning process, led by the attending physician.  Recommendations may be updated based on patient status, additional functional criteria and insurance authorization.  Follow Up Recommendations  Skilled nursing-short term rehab (<3 hours/day)      Assistance Recommended at Discharge Frequent or constant Supervision/Assistance  Patient can return home with the following Two people to help with walking and/or transfers;A lot of help with bathing/dressing/bathroom;Assist for transportation;Help with stairs or ramp for entrance;Assistance with cooking/housework   Equipment Recommendations  None recommended by PT (defer to next level of care)    Recommendations for Other Services       Precautions / Restrictions Precautions Precautions: Fall Restrictions Weight Bearing Restrictions: No     Mobility  Bed Mobility Overal bed mobility: Modified Independent Bed Mobility: Supine to Sit, Sit to Supine, Rolling Rolling: Modified independent (Device/Increase time)   Supine to sit: Modified independent (Device/Increase time) Sit to supine: Modified independent (Device/Increase time)        Transfers Overall transfer level: Needs assistance Equipment used: Rolling walker (2 wheels) Transfers: Sit to/from Stand Sit to Stand: Min assist, Min guard                Ambulation/Gait Ambulation/Gait assistance: Counsellor (Feet): 5 Feet Assistive device: Rolling walker (2 wheels) Gait Pattern/deviations: Step-through pattern, Decreased step length - right, Decreased step length - left, Narrow base of support, Trunk flexed Gait velocity: decreased     General Gait Details: 5 steps from EOB to recliner, relient on AD for stability, patient fatigues quickly, no LOB noted   Stairs             Wheelchair Mobility    Modified Rankin (Stroke Patients Only)       Balance Overall balance assessment: Needs assistance Sitting-balance support: Feet unsupported, Bilateral upper extremity supported Sitting balance-Leahy Scale: Good     Standing balance support: Bilateral upper extremity supported, During functional activity, Reliant on assistive device for balance Standing  balance-Leahy Scale: Fair                               Cognition Arousal/Alertness: Awake/alert Behavior During Therapy: WFL for tasks assessed/performed Overall Cognitive Status: Within Functional Limits for tasks assessed                                 General Comments: somewhat slow response time/processing time. Oriented to self, location, and situation        Exercises General Exercises - Lower Extremity Long Arc Quad: Seated, 15 reps, AROM, Both (visual target from therapist's hand for increased ROM) Hip Flexion/Marching: Seated, 15 reps, AROM, Both (visual target from therapists hand) Other Exercises Other Exercises: x10 seated calf raises bilaterally in recliner Other Exercises: x4 sit to stands throughout session, initally required Min A however progressed to CGA with increased reps, required RW for stability    General Comments        Pertinent Vitals/Pain Pain Assessment Pain Assessment: No/denies pain Pain Intervention(s): Limited activity within patient's tolerance, Monitored during session, Repositioned    Home Living                          Prior Function            PT Goals (current goals can now be found in the care plan section) Acute Rehab PT Goals Patient Stated Goal: to breathe better, feel better PT Goal Formulation: With patient Time For Goal Achievement: 05/17/21 Potential to Achieve Goals: Fair Progress towards PT goals: Progressing toward goals    Frequency    Min 2X/week      PT Plan Current plan remains appropriate    Co-evaluation PT/OT/SLP Co-Evaluation/Treatment: Yes Reason for Co-Treatment: For patient/therapist safety;To address functional/ADL transfers PT goals addressed during session: Mobility/safety with mobility;Balance;Strengthening/ROM OT goals addressed during session: ADL's and self-care      AM-PAC PT "6 Clicks" Mobility   Outcome Measure    Help needed moving from lying on your back to sitting on the  side of a flat bed without using bedrails?: None Help needed moving to and from a bed to a chair (including a wheelchair)?: None Help needed standing up from a chair using your arms (e.g., wheelchair or bedside chair)?: A Little Help needed to walk in hospital room?: A Little Help needed climbing 3-5 steps with a railing? : A Little 6 Click Score: 17    End of Session Equipment Utilized During Treatment: Gait belt;Oxygen Activity Tolerance: Patient limited by fatigue Patient left: in chair;with call bell/phone within reach;with chair alarm set;with nursing/sitter in room Nurse Communication: Mobility status PT Visit Diagnosis: Other abnormalities of gait and mobility (R26.89);Difficulty in walking, not elsewhere classified (R26.2);Muscle weakness (generalized) (M62.81)     Time: 5883-2549 PT Time Calculation (min) (ACUTE ONLY): 23 min  Charges:  $Therapeutic Exercise: 8-22 mins                     Iva Boop, PT  05/06/21. 11:47 AM

## 2021-05-06 NOTE — Progress Notes (Signed)
PROGRESS NOTE    Karla Sanchez  FWY:[id number] DOB: November 25, 1940 DOA: 05/01/2021 PCP: Karla Client, MD   Chief complaint.  Shortness of breath. Brief Narrative:  Karla Sanchez is a 81 y.o. female with medical history significant for HFpEF, colon cancer status post left colectomy, COPD, type 2 diabetes mellitus, diastolic heart failure, gout and hypertension, who presented to the ER with acute onset of diarrhea and associated abdominal pain.  She also been complaining of short of breath and a paroxysmal active dyspnea.  She diagnosed with acute on chronic diastolic congestive heart failure, started on IV Lasix.   Assessment & Plan:   Active Problems:   COPD (chronic obstructive pulmonary disease) (HCC)   Obesity, Class III, BMI 40-49.9 (morbid obesity) ([place])   Hypertension   Chronic respiratory failure with hypoxia (HCC)   Type 2 diabetes mellitus without complication, with long-term current use of insulin (HCC)   Physical deconditioning   Acute CHF (congestive heart failure) ([place])   Gastroenteritis due to COVID-19 virus   Acute cystitis   Chronic kidney disease, stage 3a (HCC)   Hypomagnesemia  Acute on chronic diastolic CHF (congestive heart failure) ([place]) Acute hypoxemic respiratory failure.  Ruled in. Echocardiogram performed in March 2022 showed ejection fraction 55 to 60% with grade 1 diastolic dysfunction.   Patient had a diagnosis of chronic hypoxemic respiratory failure, but has not been using oxygen for the last 2 years.  She had a hypoxemia at the time admission, consistent with acute hypoxemic respiratory failure. Condition improving, will wean off oxygen. Volume status much improved, change Lasix to oral.   Chronic kidney disease, stage 3a (HCC) Hypomagnesemia Renal function still stable.  Magnesium normalized.   Constipation  Continue senna.   Acute cystitis Patient has received a dose of fosfomycin, antibiotics completed.    Gastroenteritis due to COVID-19 virus Condition has improved.  No additional diarrhea.   Physical deconditioning- (present on admission) Patient is evaluated by physical therapy, recommended nursing home placement.     Hypertension- (present on admission) Continue home medicines.   Obesity, Class III, BMI 40-49.9 (morbid obesity) ([place])- (present on admission) Patient has morbid obesity with a BMI of 48.7.     COPD (chronic obstructive pulmonary disease) ([place])- (present on admission) Stable.   Type 2 diabetes uncontrolled with hyperglycemia. Continue sliding scale insulin. HbA1C 7.4       DVT prophylaxis: Lovenox Code Status: DNR Family Communication: Pending SNF. Disposition Plan:      Status is: Inpatient Remains inpatient appropriate because: IV Lasix for congestive heart failure, unsafe discharge.       I/O last 3 completed shifts: In: 1086.8 [P.O.:990; IV Piggyback:96.8] Out: 900 [Urine:900] No intake/output data recorded.     Subjective: Patient feels much better.  Still short of breath with exertion, no paroxysmal nocturnal dyspnea. Leg edema has resolved. No abdominal pain or nausea vomiting, still constipated.  Objective: Vitals:   05/06/21 0040 05/06/21 0623 05/06/21 0758 05/06/21 1125  BP: 126/86 (!) 169/45 132/69 (!) 128/53  Pulse: 75 (!) 59 (!) 58 65  Resp: 16 17    Temp: 98.2 F (36.8 C) 98 F (36.7 C) 97.9 F (36.6 C) 97.6 F (36.4 C)  TempSrc: Oral Oral Oral Oral  SpO2: 93% 99% 98% 97%  Weight:      Height:        Intake/Output Summary (Last 24 hours) at 05/06/2021 1249 Last data filed at 05/06/2021 0700 Gross per 24 hour  Intake 336.84 ml  Output  700 ml  Net -363.16 ml   Filed Weights   05/03/21 0500 05/04/21 0500 05/05/21 0300  Weight: 111.4 kg 111.3 kg 110.6 kg    Examination:  General exam: Appears calm and comfortable  Respiratory system: Clear to auscultation. Respiratory effort normal. Cardiovascular system: S1 & S2  heard, RRR. No JVD, murmurs, rubs, gallops or clicks. No pedal edema. Gastrointestinal system: Abdomen is nondistended, soft and nontender. No organomegaly or masses felt. Normal bowel sounds heard. Central nervous system: Alert and oriented. No focal neurological deficits. Extremities: Symmetric 5 x 5 power. Skin: No rashes, lesions or ulcers Psychiatry: Judgement and insight appear normal. Mood & affect appropriate.     Data Reviewed: I have personally reviewed following labs and imaging studies  CBC: Recent Labs  Lab 05/01/21 1701 05/02/21 0816 05/05/21 2145  WBC 6.6 7.9 8.6  HGB 11.5* 11.6* 11.5*  HCT 37.1 37.0 38.3  MCV 85.5 85.5 87.2  PLT 304 298 191   Basic Metabolic Panel: Recent Labs  Lab 05/02/21 0816 05/03/21 0547 05/04/21 0434 05/05/21 0529 05/06/21 0901  NA 139 143 141 141 137  K 4.3 4.4 4.2 4.1 4.4  CL 104 105 102 100 98  CO2 29 30 31 31 30   GLUCOSE 168* 137* 152* 151* 220*  BUN 43* 34* 30* 27* 29*  CREATININE 1.18* 1.13* 1.08* 0.93 1.10*  CALCIUM 7.5* 7.9* 7.8* 8.1* 8.2*  MG  --  1.6* 1.6* 1.6* 2.2   GFR: Estimated Creatinine Clearance: 51.4 mL/min (A) (by C-G formula based on SCr of 1.1 mg/dL (H)). Liver Function Tests: Recent Labs  Lab 05/01/21 1701  AST 22  ALT 18  ALKPHOS 87  BILITOT 0.3  PROT 5.8*  ALBUMIN 2.6*   Recent Labs  Lab 05/01/21 1701  LIPASE 64*   No results for input(s): AMMONIA in the last 168 hours. Coagulation Profile: No results for input(s): INR, PROTIME in the last 168 hours. Cardiac Enzymes: No results for input(s): CKTOTAL, CKMB, CKMBINDEX, TROPONINI in the last 168 hours. BNP (last 3 results) No results for input(s): PROBNP in the last 8760 hours. HbA1C: No results for input(s): HGBA1C in the last 72 hours. CBG: Recent Labs  Lab 05/05/21 1210 05/05/21 1604 05/05/21 2059 05/06/21 0756 05/06/21 1122  GLUCAP 157* 171* 145* 169* 178*   Lipid Profile: No results for input(s): CHOL, HDL, LDLCALC, TRIG,  CHOLHDL, LDLDIRECT in the last 72 hours. Thyroid Function Tests: No results for input(s): TSH, T4TOTAL, FREET4, T3FREE, THYROIDAB in the last 72 hours. Anemia Panel: No results for input(s): VITAMINB12, FOLATE, FERRITIN, TIBC, IRON, RETICCTPCT in the last 72 hours. Sepsis Labs: No results for input(s): PROCALCITON, LATICACIDVEN in the last 168 hours.  Recent Results (from the past 240 hour(s))  Urine Culture     Status: Abnormal   Collection Time: 05/01/21  6:23 PM   Specimen: Urine, Random  Result Value Ref Range Status   Specimen Description   Final    URINE, RANDOM Performed at [facility], [address], [place], [place] [address]    Special Requests   Final    NONE Performed at [facility], [address] [place], [place] [address]    Culture (A)  Final    <10,000 COLONIES/mL INSIGNIFICANT GROWTH Performed at [facility] [address], [place], [place] [address]    Report Status 05/03/2021 FINAL  Final  Resp Panel by RT-PCR (Flu A&B, Covid) Nasopharyngeal Swab     Status: Abnormal   Collection Time:  05/01/21 10:08 PM   Specimen: Nasopharyngeal Swab; Nasopharyngeal(NP) swabs in vial transport medium  Result Value Ref Range Status   SARS Coronavirus 2 by RT PCR POSITIVE (A) NEGATIVE Final    Comment: (NOTE) SARS-CoV-2 target nucleic acids are DETECTED.  The SARS-CoV-2 RNA is generally detectable in upper respiratory specimens during the acute phase of infection. Positive results are indicative of the presence of the identified virus, but do not rule out bacterial infection or co-infection with other pathogens not detected by the test. Clinical correlation with patient history and other diagnostic information is necessary to determine patient infection status. The expected result is Negative.  Fact Sheet for Patients: EntrepreneurPulse.com.au  Fact Sheet for Healthcare  Providers: IncredibleEmployment.be  This test is not yet approved or cleared by the [place] FDA and  has been authorized for detection and/or diagnosis of SARS-CoV-2 by FDA under an Emergency Use Authorization (EUA).  This EUA will remain in effect (meaning this test can be used) for the duration of  the COVID-19 declaration under Section 564(b)(1) of the A ct, 21 U.S.C. section 360bbb-3(b)(1), unless the authorization is terminated or revoked sooner.     Influenza A by PCR NEGATIVE NEGATIVE Final   Influenza B by PCR NEGATIVE NEGATIVE Final    Comment: (NOTE) The Xpert Xpress SARS-CoV-2/FLU/RSV plus assay is intended as an aid in the diagnosis of influenza from Nasopharyngeal swab specimens and should not be used as a sole basis for treatment. Nasal washings and aspirates are unacceptable for Xpert Xpress SARS-CoV-2/FLU/RSV testing.  Fact Sheet for Patients: EntrepreneurPulse.com.au  Fact Sheet for Healthcare Providers: IncredibleEmployment.be  This test is not yet approved or cleared by the [place] FDA and has been authorized for detection and/or diagnosis of SARS-CoV-2 by FDA under an Emergency Use Authorization (EUA). This EUA will remain in effect (meaning this test can be used) for the duration of the COVID-19 declaration under Section 564(b)(1) of the Act, 21 U.S.C. section 360bbb-3(b)(1), unless the authorization is terminated or revoked.  Performed at [facility], [address], [place], [place] [address]          Radiology Studies: No results found.      Scheduled Meds:  enoxaparin (LOVENOX) injection  0.5 mg/kg Subcutaneous Q24H   [START ON 05/07/2021] furosemide  40 mg Oral Daily   insulin aspart  0-15 Units Subcutaneous TID WC   senna-docusate  2 tablet Oral BID   Continuous Infusions:   LOS: 5 days    Time spent: 28 minutes    Sharen Hones, MD [facility]   To contact the attending provider between 7A-7P or the covering provider during after hours 7P-7A, please log into the web site [email or web address] and access using universal [place] password for that web site. If you do not have the password, please call the hospital operator.  05/06/2021, 12:49 PM

## 2021-05-07 DIAGNOSIS — R5381 Other malaise: Secondary | ICD-10-CM | POA: Diagnosis not present

## 2021-05-07 DIAGNOSIS — I5031 Acute diastolic (congestive) heart failure: Secondary | ICD-10-CM | POA: Diagnosis not present

## 2021-05-07 DIAGNOSIS — N1831 Chronic kidney disease, stage 3a: Secondary | ICD-10-CM | POA: Diagnosis not present

## 2021-05-07 LAB — BASIC METABOLIC PANEL
Anion gap: 6 (ref 5–15)
BUN: 33 mg/dL — ABNORMAL HIGH (ref 8–23)
CO2: 30 mmol/L (ref 22–32)
Calcium: 8.2 mg/dL — ABNORMAL LOW (ref 8.9–10.3)
Chloride: 102 mmol/L (ref 98–111)
Creatinine, Ser: 1.39 mg/dL — ABNORMAL HIGH (ref 0.44–1.00)
GFR, Estimated: 38 mL/min — ABNORMAL LOW (ref >60)
Glucose, Bld: 203 mg/dL — ABNORMAL HIGH (ref 70–99)
Potassium: 4.7 mmol/L (ref 3.5–5.1)
Sodium: 138 mmol/L (ref 135–145)

## 2021-05-07 LAB — GLUCOSE, CAPILLARY
Glucose-Capillary: 157 mg/dL — ABNORMAL HIGH (ref 70–99)
Glucose-Capillary: 238 mg/dL — ABNORMAL HIGH (ref 70–99)
Glucose-Capillary: 170 mg/dL — ABNORMAL HIGH (ref 70–99)
Glucose-Capillary: 185 mg/dL — ABNORMAL HIGH (ref 70–99)

## 2021-05-07 LAB — MAGNESIUM: Magnesium: 2.2 mg/dL (ref 1.7–2.4)

## 2021-05-07 MED ORDER — HYDROCORTISONE (PERIANAL) 2.5 % EX CREA
TOPICAL_CREAM | Freq: Two times a day (BID) | CUTANEOUS | Status: DC
  Administered 2021-05-09: 1 via RECTAL
  Filled 2021-05-07 (×2): qty 28.35

## 2021-05-07 NOTE — Progress Notes (Signed)
Occupational Therapy Treatment Patient Details Name: Karla Sanchez MRN: [id number] DOB: 1941/02/03 Today's Date: 05/07/2021   History of present illness Pt is an 81 y.o. female with medical history significant for HFpEF, colon cancer status post left colectomy, COPD, type 2 diabetes mellitus, diastolic heart failure, gout and hypertension, who presented to the ER with acute onset of diarrhea and associated abdominal pain.  She also been complaining of short of breath and a paroxysmal active dyspnea.  She diagnosed with acute on chronic diastolic congestive heart failure. noted for recent hospitalizations ([name] discharged from there and pt drove to [facility], and a hospital in [place] prior to that per patient).   OT comments  Pt seen for OT treatment on this date. Upon arrival to room, pt awake and seated upright in bed. [name] linen appeared to be soiled in urine. Pt agreeable to OT tx. Pt performed bed mobility MOD-I. Once seated EOB, pt reported urge for having BM. Pt performed stand pivot transfer bed>BSC and sit>stand peri-care requiring MIN GUARD only d/t decreased balance and strength. Pt walked ~4 steps BSC>recliner with MIN GUARD. Pt reported that she only performs stand pivot transfers at baseline. Pt encouraged to wash LB to promote skin integrity, with pt requiring MOD A for sit>stand LB bathing d/t decreased activity tolerance. Pt would benefit from additional skilled OT services to maximize return to PLOF and minimize risk of future falls, injury, caregiver burden, and readmission. Pt is making good progress toward goals and is nearing her baseline. Based on progress, discharge recommendation updated to [place]; TOC informed.    Recommendations for follow up therapy are one component of a multi-disciplinary discharge planning process, led by the attending physician.  Recommendations may be updated based on patient status, additional functional criteria and insurance authorization.     Follow Up Recommendations  Home health OT    Assistance Recommended at Discharge Intermittent Supervision/Assistance  Patient can return home with the following  A little help with walking and/or transfers;A lot of help with bathing/dressing/bathroom;Assistance with cooking/housework;Direct supervision/assist for medications management   Equipment Recommendations  BSC/3in1       Precautions / Restrictions Precautions Precautions: Fall Restrictions Weight Bearing Restrictions: No       Mobility Bed Mobility Overal bed mobility: Modified Independent Bed Mobility: Supine to Sit     Supine to sit: Modified independent (Device/Increase time), HOB elevated          Transfers Overall transfer level: Needs assistance Equipment used: Rolling walker (2 wheels) Transfers: Sit to/from Stand Sit to Stand: Min guard                 Balance Overall balance assessment: Needs assistance Sitting-balance support: Bilateral upper extremity supported, Feet supported Sitting balance-Leahy Scale: Good     Standing balance support: Bilateral upper extremity supported, During functional activity, Reliant on assistive device for balance Standing balance-Leahy Scale: Fair Standing balance comment: to take ~4 steps BSC> recliner                           ADL either performed or assessed with clinical judgement   ADL Overall ADL's : Needs assistance/impaired     Grooming: Wash/dry face;Brushing hair;Set up;Sitting       Lower Body Bathing: Moderate assistance;Sit to/from stand Lower Body Bathing Details (indicate cue type and reason): Requires MOD A for thoroughness in setting of fatigue         Toilet Transfer: Min guard;Squat-pivot;BSC/3in1;Rolling  walker (2 wheels)   Toileting- Clothing Manipulation and Hygiene: Min guard;Sit to/from stand                Cognition Arousal/Alertness: Awake/alert Behavior During Therapy: WFL for tasks  assessed/performed Overall Cognitive Status: No family/caregiver present to determine baseline cognitive functioning                                 General Comments: Pt A&Ox4                   Pertinent Vitals/ Pain       Pain Assessment Pain Assessment: 0-10 Faces Pain Scale: Hurts a little bit Pain Location: groin Pain Descriptors / Indicators: Aching Pain Intervention(s): Monitored during session, Repositioned         Frequency  Min 2X/week        Progress Toward Goals  OT Goals(current goals can now be found in the care plan section)  Progress towards OT goals: Progressing toward goals  Acute Rehab OT Goals Patient Stated Goal: to regain strength OT Goal Formulation: With patient Time For Goal Achievement: 05/17/21 Potential to Achieve Goals: [place] Discharge plan needs to be updated;Frequency remains appropriate       AM-PAC OT "6 Clicks" Daily Activity     Outcome Measure   Help from another person eating meals?: None Help from another person taking care of personal grooming?: None Help from another person toileting, which includes using toliet, bedpan, or urinal?: A Lot Help from another person bathing (including washing, rinsing, drying)?: A Little Help from another person to put on and taking off regular upper body clothing?: None Help from another person to put on and taking off regular lower body clothing?: A Lot 6 Click Score: 19    End of Session Equipment Utilized During Treatment: Gait belt  OT Visit Diagnosis: Muscle weakness (generalized) (M62.81)   Activity Tolerance Patient tolerated treatment well   Patient Left in chair;with call bell/phone within reach;with chair alarm set   Nurse Communication Mobility status        Time: 4315-4008 OT Time Calculation (min): 26 min  Charges: OT Treatments $Self Care/Home Management : 23-37 mins  Fredirick Maudlin, OTR/L [place]

## 2021-05-07 NOTE — Progress Notes (Signed)
Progress Note    Karla Sanchez  OBS:[id number] DOB: January 02, 1941  DOA: 05/01/2021 PCP: Alfredia Client, MD      Brief Narrative:    Medical records reviewed and are as summarized below:  Karla Sanchez is a 81 y.o. female with medical history significant for HFpEF, colon cancer status post left colectomy, COPD, type 2 diabetes mellitus, diastolic heart failure, gout and hypertension, who presented to the ER with acute onset of diarrhea and associated abdominal pain.  She also complained of shortness of breath and paroxysmal nocturnal dyspnea.   She was admitted to the hospital for acute on chronic diastolic CHF.  She was treated with IV Lasix.      Assessment/Plan:   Active Problems:   COPD (chronic obstructive pulmonary disease) (HCC)   Obesity, Class III, BMI 40-49.9 (morbid obesity) ([place])   Hypertension   Chronic respiratory failure with hypoxia (HCC)   Type 2 diabetes mellitus without complication, with long-term current use of insulin (HCC)   Physical deconditioning   Acute CHF (congestive heart failure) ([place])   Gastroenteritis due to COVID-19 virus   Acute cystitis   Chronic kidney disease, stage 3a (HCC)   Hypomagnesemia    Body mass index is 38.18 kg/m.  (Obesity)    Acute on chronic diastolic CHF complicated by acute hypoxic respiratory failure: Improved.  She has been weaned off oxygen.  Continue oral Lasix.  Abnormal urinalysis with suspected acute UTI/cystitis: she was given 1 dose of ceftriaxone on 05/02/2021.  Urine culture did not show any significant growth.  Gastroenteritis due to COVID-19 infection:  GI symptoms have improved  COPD: Stable.  Continue bronchodilators  CKD stage IIIa: Creatinine is stable  Debility: PT recommends discharge to SNF.  Other comorbidities include type II DM with hyperglycemia, hypertension    Diet Order             Diet Carb Modified Fluid consistency: Thin; Room service  appropriate? Yes  Diet effective now                     Consultants: Non  Procedures: None    Medications:    enoxaparin (LOVENOX) injection  0.5 mg/kg Subcutaneous Q24H   furosemide  40 mg Oral Daily   insulin aspart  0-15 Units Subcutaneous TID WC   senna-docusate  2 tablet Oral BID   Continuous Infusions:   Anti-infectives (From admission, onward)    Start     Dose/Rate Route Frequency Ordered Stop   05/02/21 0130  cefTRIAXone (ROCEPHIN) 1 g in sodium chloride 0.9 % 100 mL IVPB  Status:  Discontinued        1 g 200 mL/hr over 30 Minutes Intravenous Daily 05/02/21 0115 05/02/21 0828              Family Communication/Anticipated D/C date and plan/Code Status   DVT prophylaxis:      Code Status: DNR  Family Communication: None Disposition Plan: Plan to discharge to SNF   Status is: Inpatient Remains inpatient appropriate because: Awaiting placement to SNF               Subjective:   Interval events noted  Objective:    Vitals:   05/07/21 0442 05/07/21 0804 05/07/21 0809 05/07/21 1129  BP: (!) 135/45 (!) 151/119 (!) 149/89 (!) 124/44  Pulse: 64 63  (!) 55  Resp: 16     Temp: 98.1 F (36.7 C) 98.7 F (37.1 C)  97.8  F (36.6 C)  TempSrc: Oral Oral  Oral  SpO2: 99% 100%  95%  Weight: 107.3 kg     Height:       No data found.   Intake/Output Summary (Last 24 hours) at 05/07/2021 1524 Last data filed at 05/06/2021 2200 Gross per 24 hour  Intake 240 ml  Output --  Net 240 ml   Filed Weights   05/04/21 0500 05/05/21 0300 05/07/21 0442  Weight: 111.3 kg 110.6 kg 107.3 kg    Exam:  GEN: NAD SKIN: Warm and dry EYES: No pallor or icterus ENT: MMM CV: RRR PULM: CTA B ABD: soft, obese, NT, +BS CNS: AAO x 3, non focal EXT: No edema or tenderness        Data Reviewed:   I have personally reviewed following labs and imaging studies:  Labs: Labs show the following:   Basic Metabolic Panel: Recent Labs   Lab 05/02/21 0816 05/03/21 0547 05/04/21 0434 05/05/21 0529 05/06/21 0901  NA 139 143 141 141 137  K 4.3 4.4 4.2 4.1 4.4  CL 104 105 102 100 98  CO2 29 30 31 31 30   GLUCOSE 168* 137* 152* 151* 220*  BUN 43* 34* 30* 27* 29*  CREATININE 1.18* 1.13* 1.08* 0.93 1.10*  CALCIUM 7.5* 7.9* 7.8* 8.1* 8.2*  MG  --  1.6* 1.6* 1.6* 2.2   GFR Estimated Creatinine Clearance: 50.5 mL/min (A) (by C-G formula based on SCr of 1.1 mg/dL (H)). Liver Function Tests: Recent Labs  Lab 05/01/21 1701  AST 22  ALT 18  ALKPHOS 87  BILITOT 0.3  PROT 5.8*  ALBUMIN 2.6*   Recent Labs  Lab 05/01/21 1701  LIPASE 64*   No results for input(s): AMMONIA in the last 168 hours. Coagulation profile No results for input(s): INR, PROTIME in the last 168 hours.  CBC: Recent Labs  Lab 05/01/21 1701 05/02/21 0816 05/05/21 2145  WBC 6.6 7.9 8.6  HGB 11.5* 11.6* 11.5*  HCT 37.1 37.0 38.3  MCV 85.5 85.5 87.2  PLT 304 298 255   Cardiac Enzymes: No results for input(s): CKTOTAL, CKMB, CKMBINDEX, TROPONINI in the last 168 hours. BNP (last 3 results) No results for input(s): PROBNP in the last 8760 hours. CBG: Recent Labs  Lab 05/06/21 1122 05/06/21 1647 05/06/21 2117 05/07/21 0805 05/07/21 1130  GLUCAP 178* 113* 203* 157* 238*   D-Dimer: No results for input(s): DDIMER in the last 72 hours. Hgb A1c: No results for input(s): HGBA1C in the last 72 hours. Lipid Profile: No results for input(s): CHOL, HDL, LDLCALC, TRIG, CHOLHDL, LDLDIRECT in the last 72 hours. Thyroid function studies: No results for input(s): TSH, T4TOTAL, T3FREE, THYROIDAB in the last 72 hours.  Invalid input(s): FREET3 Anemia work up: No results for input(s): VITAMINB12, FOLATE, FERRITIN, TIBC, IRON, RETICCTPCT in the last 72 hours. Sepsis Labs: Recent Labs  Lab 05/01/21 1701 05/02/21 0816 05/05/21 2145  WBC 6.6 7.9 8.6    Microbiology Recent Results (from the past 240 hour(s))  Urine Culture     Status:  Abnormal   Collection Time: 05/01/21  6:23 PM   Specimen: Urine, Random  Result Value Ref Range Status   Specimen Description   Final    URINE, RANDOM Performed at [facility], [address] [place], [place] [address]    Special Requests   Final    NONE Performed at [facility], [address], [place], [place] [address]    Culture (A)  Final    <  10,000 COLONIES/mL INSIGNIFICANT GROWTH Performed at [facility], [place] [address] [place], [place] [address]    Report Status 05/03/2021 FINAL  Final  Resp Panel by RT-PCR (Flu A&B, Covid) Nasopharyngeal Swab     Status: Abnormal   Collection Time: 05/01/21 10:08 PM   Specimen: Nasopharyngeal Swab; Nasopharyngeal(NP) swabs in vial transport medium  Result Value Ref Range Status   SARS Coronavirus 2 by RT PCR POSITIVE (A) NEGATIVE Final    Comment: (NOTE) SARS-CoV-2 target nucleic acids are DETECTED.  The SARS-CoV-2 RNA is generally detectable in upper respiratory specimens during the acute phase of infection. Positive results are indicative of the presence of the identified virus, but do not rule out bacterial infection or co-infection with other pathogens not detected by the test. Clinical correlation with patient history and other diagnostic information is necessary to determine patient infection status. The expected result is Negative.  Fact Sheet for Patients: EntrepreneurPulse.com.au  Fact Sheet for Healthcare Providers: IncredibleEmployment.be  This test is not yet approved or cleared by the [place] FDA and  has been authorized for detection and/or diagnosis of SARS-CoV-2 by FDA under an Emergency Use Authorization (EUA).  This EUA will remain in effect (meaning this test can be used) for the duration of  the COVID-19 declaration under Section 564(b)(1) of the A ct, 21 U.S.C. section 360bbb-3(b)(1), unless the authorization is terminated or revoked  sooner.     Influenza A by PCR NEGATIVE NEGATIVE Final   Influenza B by PCR NEGATIVE NEGATIVE Final    Comment: (NOTE) The Xpert Xpress SARS-CoV-2/FLU/RSV plus assay is intended as an aid in the diagnosis of influenza from Nasopharyngeal swab specimens and should not be used as a sole basis for treatment. Nasal washings and aspirates are unacceptable for Xpert Xpress SARS-CoV-2/FLU/RSV testing.  Fact Sheet for Patients: EntrepreneurPulse.com.au  Fact Sheet for Healthcare Providers: IncredibleEmployment.be  This test is not yet approved or cleared by the [place] FDA and has been authorized for detection and/or diagnosis of SARS-CoV-2 by FDA under an Emergency Use Authorization (EUA). This EUA will remain in effect (meaning this test can be used) for the duration of the COVID-19 declaration under Section 564(b)(1) of the Act, 21 U.S.C. section 360bbb-3(b)(1), unless the authorization is terminated or revoked.  Performed at [facility], [address] [place],  [address]     Procedures and diagnostic studies:  No results found.             LOS: 6 days   Lilia Letterman  [facility]   Pager on [email or web address]. If 7PM-7AM, please contact night-coverage at [email or web address]     05/07/2021, 3:24 PM

## 2021-05-07 NOTE — Progress Notes (Signed)
Physical Therapy Treatment Patient Details Name: Karla Sanchez MRN: [id number] DOB: May 15, 1940 Today's Date: 05/07/2021   History of Present Illness Pt is an 81 y.o. female with medical history significant for HFpEF, colon cancer status post left colectomy, COPD, type 2 diabetes mellitus, diastolic heart failure, gout and hypertension, who presented to the ER with acute onset of diarrhea and associated abdominal pain.  She also been complaining of short of breath and a paroxysmal active dyspnea.  She diagnosed with acute on chronic diastolic congestive heart failure. noted for recent hospitalizations ([name] discharged from there and pt drove to [facility], and a hospital in [place] prior to that per patient).    PT Comments    Patient was agreeable to treatment, however required frequent encouragement to complete each activity. Upon entering room patient was seated in recliner, with no reported pain. She stated that she did not get much sleep the past 2 nights and was very tired.  Seated therapeutic exercise required visual cueing for increased ROM to work on BLE strengthening. Sit to stand transfer from recliner to EOB required Min A to complete and RW. Patient reported she was to weak to complete movement. When patient was told she would have to complete movement to get to her bed, she completed with some assistance. Streaks were noted on recliner chuck pad. When cued to stand from EOB, patient was able to complete supervision with use of RW. Patient declined ambulate outside of 4 steps from recliner to bed as she is to tired, and is fearful of falling. Patient was able to complete sit to supine bed transfer Mod I. Patient is very self limiting and required a lot of encouragement to participate. Patient would continue to benefit from skilled physical therapy in order to optimize patient's return to PLOF. Continue to recommend STR upon discharge from acute hospitalization.    Recommendations for  follow up therapy are one component of a multi-disciplinary discharge planning process, led by the attending physician.  Recommendations may be updated based on patient status, additional functional criteria and insurance authorization.  Follow Up Recommendations  Skilled nursing-short term rehab (<3 hours/day)     Assistance Recommended at Discharge Frequent or constant Supervision/Assistance  Patient can return home with the following Two people to help with walking and/or transfers;A lot of help with bathing/dressing/bathroom;Assist for transportation;Help with stairs or ramp for entrance;Assistance with cooking/housework   Equipment Recommendations  None recommended by PT (defer to next level of care)    Recommendations for Other Services       Precautions / Restrictions Precautions Precautions: Fall Restrictions Weight Bearing Restrictions: No     Mobility  Bed Mobility Overal bed mobility: Modified Independent Bed Mobility: Sit to Supine       Sit to supine: Modified independent (Device/Increase time)   General bed mobility comments: with HOB flat and use of bedrails, required no physical assistance    Transfers Overall transfer level: Needs assistance Equipment used: Rolling walker (2 wheels) Transfers: Sit to/from Stand Sit to Stand: Supervision, Min assist           General transfer comment: Initially required Min A from recliner as patient stated she does not have the strength to complete the movement, sitting EOB patient was able to complete supervision    Ambulation/Gait Ambulation/Gait assistance: Supervision Gait Distance (Feet): 4 Feet (from recliner to EOB, when encouraged to walk further patient stated she can not do that, that she will fall, she delcined further walking) Assistive device:  Rolling walker (2 wheels) Gait Pattern/deviations: Step-through pattern, Decreased step length - right, Decreased step length - left, Narrow base of support,  Trunk flexed Gait velocity: decreased     General Gait Details: 4 steps from recliner to EOB, reliant on AD for stability, patient fatigues quickly, no LOB noted   Stairs             Wheelchair Mobility    Modified Rankin (Stroke Patients Only)       Balance Overall balance assessment: Needs assistance Sitting-balance support: Bilateral upper extremity supported, Feet supported Sitting balance-Leahy Scale: Good     Standing balance support: Bilateral upper extremity supported, During functional activity, Reliant on assistive device for balance Standing balance-Leahy Scale: Fair Standing balance comment: to take ~4 steps recliner> EOB                            Cognition Arousal/Alertness: Awake/alert Behavior During Therapy: WFL for tasks assessed/performed Overall Cognitive Status: No family/caregiver present to determine baseline cognitive functioning                                 General Comments: Pt A&Ox4. Requires significant encouragement to attempt seated exercises, very motivated to get back to bed        Exercises General Exercises - Lower Extremity Long Arc Quad: Seated, 15 reps, AROM, Both Hip Flexion/Marching: Seated, 15 reps, AROM, Both Other Exercises Other Exercises: x10 seated calf raises bilaterally in recliner    General Comments        Pertinent Vitals/Pain Pain Assessment Pain Assessment: No/denies pain Pain Intervention(s): Limited activity within patient's tolerance, Monitored during session, Repositioned    Home Living                          Prior Function            PT Goals (current goals can now be found in the care plan section) Acute Rehab PT Goals Patient Stated Goal: to breathe better, feel better PT Goal Formulation: With patient Time For Goal Achievement: 05/17/21 Potential to Achieve Goals: Fair Progress towards PT goals: Progressing toward goals    Frequency    Min  2X/week      PT Plan Current plan remains appropriate    Co-evaluation              AM-PAC PT "6 Clicks" Mobility   Outcome Measure  Help needed turning from your back to your side while in a flat bed without using bedrails?: A Little Help needed moving from lying on your back to sitting on the side of a flat bed without using bedrails?: A Little Help needed moving to and from a bed to a chair (including a wheelchair)?: None Help needed standing up from a chair using your arms (e.g., wheelchair or bedside chair)?: A Little Help needed to walk in hospital room?: A Little Help needed climbing 3-5 steps with a railing? : A Little 6 Click Score: 19    End of Session Equipment Utilized During Treatment: Gait belt Activity Tolerance: Patient limited by fatigue Patient left: with call bell/phone within reach;in bed;with bed alarm set Nurse Communication: Mobility status PT Visit Diagnosis: Other abnormalities of gait and mobility (R26.89);Difficulty in walking, not elsewhere classified (R26.2);Muscle weakness (generalized) (M62.81)     Time: 4098-1191 PT Time Calculation (min) (ACUTE  ONLY): 21 min  Charges:  $Therapeutic Exercise: 8-22 mins  Iva Boop, PT  05/07/21. 11:45 AM

## 2021-05-08 DIAGNOSIS — I5031 Acute diastolic (congestive) heart failure: Secondary | ICD-10-CM | POA: Diagnosis not present

## 2021-05-08 LAB — GLUCOSE, CAPILLARY
Glucose-Capillary: 152 mg/dL — ABNORMAL HIGH (ref 70–99)
Glucose-Capillary: 161 mg/dL — ABNORMAL HIGH (ref 70–99)
Glucose-Capillary: 190 mg/dL — ABNORMAL HIGH (ref 70–99)
Glucose-Capillary: 132 mg/dL — ABNORMAL HIGH (ref 70–99)

## 2021-05-08 NOTE — TOC Progression Note (Addendum)
Transition of Care [facility]) - Progression Note    Patient Details  Name: Karla Sanchez MRN: [id number] Date of Birth: 22-Apr-1940  Transition of Care [facility]) CM/SW [place], LCSW Phone Number: 05/08/2021, 12:29 PM  Clinical Narrative:   Only [facility] that has not responded is [facility]. Left voicemail for admissions coordinator asking her to review.  1:07 pm: [facility] admissions coordinator asked about Trelegy Ellipta which appears to be a home medication. She is checking the price with their pharmacy because this appears to be a very expensive medication.  Expected Discharge Plan: [place] Barriers to Discharge: Continued Medical Work up  Expected Discharge Plan and Services Expected Discharge Plan: [place]   Discharge Planning Services: CM Consult Post Acute Care Choice: [place] Living arrangements for the past 2 months: Hotel/Motel                 DME Arranged: N/A DME Agency: NA                   Social Determinants of Health (SDOH) Interventions    Readmission Risk Interventions Readmission Risk Prevention Plan 06/10/2020 10/24/2019  Transportation Screening Complete -  Medication Review Press [employer]) Complete Complete  PCP or Specialist appointment within 3-5 days of discharge Complete -  [place] or Home Care Consult Complete Complete  SW Recovery Care/Counseling Consult Complete -  Palliative Care Screening Not Applicable -  [place] Complete Patient Refused  Some recent data might be hidden

## 2021-05-08 NOTE — Progress Notes (Addendum)
Progress Note    Karla Sanchez  RCV:[id number] DOB: 1940-08-02  DOA: 05/01/2021 PCP: Alfredia Client, MD      Brief Narrative:    Medical records reviewed and are as summarized below:  Karla Sanchez is a 82 y.o. female with medical history significant for HFpEF, colon cancer status post left colectomy, COPD, type 2 diabetes mellitus, diastolic heart failure, gout and hypertension, who presented to the ER with acute onset of diarrhea and associated abdominal pain.  She also complained of shortness of breath and paroxysmal nocturnal dyspnea.   She was admitted to the hospital for acute on chronic diastolic CHF.  She was treated with IV Lasix.      Assessment/Plan:   Active Problems:   COPD (chronic obstructive pulmonary disease) (HCC)   Obesity, Class III, BMI 40-49.9 (morbid obesity) ([place])   Hypertension   Chronic respiratory failure with hypoxia (HCC)   Type 2 diabetes mellitus without complication, with long-term current use of insulin (HCC)   Physical deconditioning   Acute CHF (congestive heart failure) ([place])   Gastroenteritis due to COVID-19 virus   Acute cystitis   Chronic kidney disease, stage 3a (HCC)   Hypomagnesemia    Body mass index is 38.39 kg/m.  (Obesity)    Acute on chronic diastolic CHF complicated by acute hypoxic respiratory failure: Improved.  She is tolerating room air.  Continue Lasix  Abnormal urinalysis with suspected acute UTI/cystitis: she was given 1 dose of ceftriaxone on 05/02/2021.  Urine culture did not show any significant growth.  Gastroenteritis due to COVID-19 infection:  GI symptoms have improved  COPD: Stable.  Continue bronchodilators  CKD stage IIIa: Creatinine is stable  Debility: PT recommends discharge to SNF.  Other comorbidities include type II DM with hyperglycemia, hypertension  Medically stable for discharge but awaiting placement to SNF.  Initially, she did not want to go to any  SNF outside [place]. However, she has now decided to look at [facility] bed offers outside [place].  Follow-up with social worker to assist with disposition  Diet Order             Diet Carb Modified Fluid consistency: Thin; Room service appropriate? Yes  Diet effective now                     Consultants: None  Procedures: None    Medications:    enoxaparin (LOVENOX) injection  0.5 mg/kg Subcutaneous Q24H   furosemide  40 mg Oral Daily   hydrocortisone   Rectal BID   insulin aspart  0-15 Units Subcutaneous TID WC   senna-docusate  2 tablet Oral BID   Continuous Infusions:   Anti-infectives (From admission, onward)    Start     Dose/Rate Route Frequency Ordered Stop   05/02/21 0130  cefTRIAXone (ROCEPHIN) 1 g in sodium chloride 0.9 % 100 mL IVPB  Status:  Discontinued        1 g 200 mL/hr over 30 Minutes Intravenous Daily 05/02/21 0115 05/02/21 0828              Family Communication/Anticipated D/C date and plan/Code Status   DVT prophylaxis:      Code Status: DNR  Family Communication: None Disposition Plan: Plan to discharge to SNF   Status is: Inpatient Remains inpatient appropriate because: Awaiting placement to SNF               Subjective:   C/o fatigue from lack of sleep  Objective:    Vitals:   05/08/21 0507 05/08/21 0651 05/08/21 0725 05/08/21 1152  BP: (!) 162/82  (!) 175/63 92/66  Pulse: 77  68 (!) 52  Resp: 17     Temp: 98.7 F (37.1 C)  98.1 F (36.7 C)   TempSrc:   Oral   SpO2: 95%  94% 98%  Weight:  107.9 kg    Height:       No data found.   Intake/Output Summary (Last 24 hours) at 05/08/2021 1417 Last data filed at 05/08/2021 0700 Gross per 24 hour  Intake --  Output 600 ml  Net -600 ml   Filed Weights   05/05/21 0300 05/07/21 0442 05/08/21 0651  Weight: 110.6 kg 107.3 kg 107.9 kg    Exam:  GEN: NAD SKIN: No rash EYES: EOMI ENT: MMM CV: RRR PULM: CTA B ABD: soft, obese, NT, +BS CNS: AAO x  3, non focal EXT: No edema or tenderness       Data Reviewed:   I have personally reviewed following labs and imaging studies:  Labs: Labs show the following:   Basic Metabolic Panel: Recent Labs  Lab 05/03/21 0547 05/04/21 0434 05/05/21 0529 05/06/21 0901 05/07/21 2158  NA 143 141 141 137 138  K 4.4 4.2 4.1 4.4 4.7  CL 105 102 100 98 102  CO2 30 31 31 30 30   GLUCOSE 137* 152* 151* 220* 203*  BUN 34* 30* 27* 29* 33*  CREATININE 1.13* 1.08* 0.93 1.10* 1.39*  CALCIUM 7.9* 7.8* 8.1* 8.2* 8.2*  MG 1.6* 1.6* 1.6* 2.2 2.2   GFR Estimated Creatinine Clearance: 40.1 mL/min (A) (by C-G formula based on SCr of 1.39 mg/dL (H)). Liver Function Tests: Recent Labs  Lab 05/01/21 1701  AST 22  ALT 18  ALKPHOS 87  BILITOT 0.3  PROT 5.8*  ALBUMIN 2.6*   Recent Labs  Lab 05/01/21 1701  LIPASE 64*   No results for input(s): AMMONIA in the last 168 hours. Coagulation profile No results for input(s): INR, PROTIME in the last 168 hours.  CBC: Recent Labs  Lab 05/01/21 1701 05/02/21 0816 05/05/21 2145  WBC 6.6 7.9 8.6  HGB 11.5* 11.6* 11.5*  HCT 37.1 37.0 38.3  MCV 85.5 85.5 87.2  PLT 304 298 255   Cardiac Enzymes: No results for input(s): CKTOTAL, CKMB, CKMBINDEX, TROPONINI in the last 168 hours. BNP (last 3 results) No results for input(s): PROBNP in the last 8760 hours. CBG: Recent Labs  Lab 05/07/21 1130 05/07/21 1531 05/07/21 2023 05/08/21 0724 05/08/21 1154  GLUCAP 238* 170* 185* 152* 161*   D-Dimer: No results for input(s): DDIMER in the last 72 hours. Hgb A1c: No results for input(s): HGBA1C in the last 72 hours. Lipid Profile: No results for input(s): CHOL, HDL, LDLCALC, TRIG, CHOLHDL, LDLDIRECT in the last 72 hours. Thyroid function studies: No results for input(s): TSH, T4TOTAL, T3FREE, THYROIDAB in the last 72 hours.  Invalid input(s): FREET3 Anemia work up: No results for input(s): VITAMINB12, FOLATE, FERRITIN, TIBC, IRON, RETICCTPCT in  the last 72 hours. Sepsis Labs: Recent Labs  Lab 05/01/21 1701 05/02/21 0816 05/05/21 2145  WBC 6.6 7.9 8.6    Microbiology Recent Results (from the past 240 hour(s))  Urine Culture     Status: Abnormal   Collection Time: 05/01/21  6:23 PM   Specimen: Urine, Random  Result Value Ref Range Status   Specimen Description   Final    URINE, RANDOM Performed at [facility], [place]  [address] [place], [place] [address]    Special Requests   Final    NONE Performed at [facility], [address] [place], [place] [address]    Culture (A)  Final    <10,000 COLONIES/mL INSIGNIFICANT GROWTH Performed at [place] [address] [place], [place] [address]    Report Status 05/03/2021 FINAL  Final  Resp Panel by RT-PCR (Flu A&B, Covid) Nasopharyngeal Swab     Status: Abnormal   Collection Time: 05/01/21 10:08 PM   Specimen: Nasopharyngeal Swab; Nasopharyngeal(NP) swabs in vial transport medium  Result Value Ref Range Status   SARS Coronavirus 2 by RT PCR POSITIVE (A) NEGATIVE Final    Comment: (NOTE) SARS-CoV-2 target nucleic acids are DETECTED.  The SARS-CoV-2 RNA is generally detectable in upper respiratory specimens during the acute phase of infection. Positive results are indicative of the presence of the identified virus, but do not rule out bacterial infection or co-infection with other pathogens not detected by the test. Clinical correlation with patient history and other diagnostic information is necessary to determine patient infection status. The expected result is Negative.  Fact Sheet for Patients: EntrepreneurPulse.com.au  Fact Sheet for Healthcare Providers: IncredibleEmployment.be  This test is not yet approved or cleared by the [place] FDA and  has been authorized for detection and/or diagnosis of SARS-CoV-2 by FDA under an Emergency Use Authorization (EUA).  This EUA will remain in effect  (meaning this test can be used) for the duration of  the COVID-19 declaration under Section 564(b)(1) of the A ct, 21 U.S.C. section 360bbb-3(b)(1), unless the authorization is terminated or revoked sooner.     Influenza A by PCR NEGATIVE NEGATIVE Final   Influenza B by PCR NEGATIVE NEGATIVE Final    Comment: (NOTE) The Xpert Xpress SARS-CoV-2/FLU/RSV plus assay is intended as an aid in the diagnosis of influenza from Nasopharyngeal swab specimens and should not be used as a sole basis for treatment. Nasal washings and aspirates are unacceptable for Xpert Xpress SARS-CoV-2/FLU/RSV testing.  Fact Sheet for Patients: EntrepreneurPulse.com.au  Fact Sheet for Healthcare Providers: IncredibleEmployment.be  This test is not yet approved or cleared by the [place] FDA and has been authorized for detection and/or diagnosis of SARS-CoV-2 by FDA under an Emergency Use Authorization (EUA). This EUA will remain in effect (meaning this test can be used) for the duration of the COVID-19 declaration under Section 564(b)(1) of the Act, 21 U.S.C. section 360bbb-3(b)(1), unless the authorization is terminated or revoked.  Performed at [facility], [address] [place], [place] [address]     Procedures and diagnostic studies:  No results found.             LOS: 7 days   Monique Hefty  [facility]   Pager on [email or web address]. If 7PM-7AM, please contact night-coverage at [email or web address]     05/08/2021, 2:17 PM

## 2021-05-09 DIAGNOSIS — I5033 Acute on chronic diastolic (congestive) heart failure: Secondary | ICD-10-CM | POA: Diagnosis not present

## 2021-05-09 DIAGNOSIS — U071 COVID-19: Secondary | ICD-10-CM | POA: Diagnosis not present

## 2021-05-09 DIAGNOSIS — A0839 Other viral enteritis: Secondary | ICD-10-CM | POA: Diagnosis not present

## 2021-05-09 LAB — GLUCOSE, CAPILLARY
Glucose-Capillary: 159 mg/dL — ABNORMAL HIGH (ref 70–99)
Glucose-Capillary: 149 mg/dL — ABNORMAL HIGH (ref 70–99)
Glucose-Capillary: 164 mg/dL — ABNORMAL HIGH (ref 70–99)
Glucose-Capillary: 155 mg/dL — ABNORMAL HIGH (ref 70–99)

## 2021-05-09 MED ORDER — FUROSEMIDE 40 MG PO TABS: 40.0000 mg | ORAL_TABLET | Freq: Every day | ORAL | Status: AC

## 2021-05-09 MED ORDER — SPIRIVA HANDIHALER 18 MCG IN CAPS: 18.0000 ug | ORAL_CAPSULE | Freq: Every day | RESPIRATORY_TRACT | Status: DC

## 2021-05-09 MED ORDER — FLUTICASONE-SALMETEROL 100-50 MCG/ACT IN AEPB
1.0000 | INHALATION_SPRAY | Freq: Two times a day (BID) | RESPIRATORY_TRACT | Status: DC
Start: 2021-05-09 — End: 2021-05-12

## 2021-05-09 NOTE — Progress Notes (Signed)
Progress Note    Karla Sanchez  DXA:[id number] DOB: 12/23/1940  DOA: 05/01/2021 PCP: Alfredia Client, MD      Brief Narrative:    Medical records reviewed and are as summarized below:  Karla Sanchez is a 81 y.o. female with medical history significant for recent COVID-19 infection (positive test on 04/30/2021), colon cancer status post left colectomy, COPD, type 2 diabetes mellitus, chronic diastolic heart failure, gout and hypertension, who presented to the ER with acute onset of diarrhea and associated abdominal pain.  She also complained of shortness of breath and paroxysmal nocturnal dyspnea.   She was admitted to the hospital for gastroenteritis from COVID-19 infection and acute on chronic diastolic CHF complicated by acute hypoxic respiratory failure.  She was treated with IV Lasix and oxygen via nasal cannula.  She also received a dose of IV Rocephin for suspected UTI but urine culture was negative and UTI was felt to be unlikely.  She was evaluated by PT who recommended further rehabilitation at the skilled nursing facility.  Her condition has improved and she is deemed stable for discharge to SNF today.      Assessment/Plan:   Principal Problem:   Acute on chronic diastolic CHF (congestive heart failure) (HCC) Active Problems:   COPD (chronic obstructive pulmonary disease) (HCC)   Obesity, Class III, BMI 40-49.9 (morbid obesity) ([place])   Hypertension   Chronic respiratory failure with hypoxia (HCC)   Type 2 diabetes mellitus without complication, with long-term current use of insulin (HCC)   Physical deconditioning   Gastroenteritis due to COVID-19 virus   Acute cystitis   Chronic kidney disease, stage 3a (HCC)   Hypomagnesemia    Body mass index is 36.22 kg/m.  (Obesity)    Acute on chronic diastolic CHF complicated by acute hypoxic respiratory failure: Improved.  Continue Lasix  Abnormal urinalysis with suspected acute  UTI/cystitis: she was given 1 dose of ceftriaxone on 05/02/2021.  Urine culture did not show any significant growth.  Gastroenteritis due to COVID-19 infection:  GI symptoms have improved  COPD: Stable.  Continue bronchodilators  CKD stage IIIa: Creatinine is stable  Debility: PT recommends discharge to SNF.  Other comorbidities include type II DM with hyperglycemia, hypertension  Medically stable for discharge but awaiting placement to SNF.  Per Judson Roch, Education officer, museum, patient can go to SNF on 05/12/21    Diet Order             Diet - low sodium heart healthy           Diet Carb Modified Fluid consistency: Thin; Room service appropriate? Yes  Diet effective now                     Consultants: None  Procedures: None    Medications:    enoxaparin (LOVENOX) injection  0.5 mg/kg Subcutaneous Q24H   furosemide  40 mg Oral Daily   hydrocortisone   Rectal BID   insulin aspart  0-15 Units Subcutaneous TID WC   senna-docusate  2 tablet Oral BID   Continuous Infusions:   Anti-infectives (From admission, onward)    Start     Dose/Rate Route Frequency Ordered Stop   05/02/21 0130  cefTRIAXone (ROCEPHIN) 1 g in sodium chloride 0.9 % 100 mL IVPB  Status:  Discontinued        1 g 200 mL/hr over 30 Minutes Intravenous Daily 05/02/21 0115 05/02/21 0828  Family Communication/Anticipated D/C date and plan/Code Status   DVT prophylaxis:      Code Status: DNR  Family Communication: None Disposition Plan: Plan to discharge to SNF   Status is: Inpatient Remains inpatient appropriate because: Awaiting placement to SNF               Subjective:   Interval events noted. No shortness of breath or chest pain  Objective:    Vitals:   05/08/21 2350 05/09/21 0415 05/09/21 0828 05/09/21 1138  BP: (!) 190/58  (!) 166/67 (!) 136/58  Pulse: 69  62 (!) 55  Resp: 18  16 19   Temp: (!) 97.5 F (36.4 C)  98.4 F (36.9 C) 97.7 F (36.5 C)   TempSrc:    Oral  SpO2: 94%  98% 98%  Weight:  101.8 kg    Height:       No data found.   Intake/Output Summary (Last 24 hours) at 05/09/2021 1328 Last data filed at 05/08/2021 1644 Gross per 24 hour  Intake --  Output 600 ml  Net -600 ml   Filed Weights   05/07/21 0442 05/08/21 0651 05/09/21 0415  Weight: 107.3 kg 107.9 kg 101.8 kg    Exam:  GEN: NAD SKIN: Warm and dry EYES: No pallor or icterus ENT: MMM CV: RRR PULM: CTA B ABD: soft, obese, NT, +BS CNS: AAO x 3, non focal EXT: No edema or tenderness        Data Reviewed:   I have personally reviewed following labs and imaging studies:  Labs: Labs show the following:   Basic Metabolic Panel: Recent Labs  Lab 05/03/21 0547 05/04/21 0434 05/05/21 0529 05/06/21 0901 05/07/21 2158  NA 143 141 141 137 138  K 4.4 4.2 4.1 4.4 4.7  CL 105 102 100 98 102  CO2 30 31 31 30 30   GLUCOSE 137* 152* 151* 220* 203*  BUN 34* 30* 27* 29* 33*  CREATININE 1.13* 1.08* 0.93 1.10* 1.39*  CALCIUM 7.9* 7.8* 8.1* 8.2* 8.2*  MG 1.6* 1.6* 1.6* 2.2 2.2   GFR Estimated Creatinine Clearance: 38.9 mL/min (A) (by C-G formula based on SCr of 1.39 mg/dL (H)). Liver Function Tests: No results for input(s): AST, ALT, ALKPHOS, BILITOT, PROT, ALBUMIN in the last 168 hours.  No results for input(s): LIPASE, AMYLASE in the last 168 hours.  No results for input(s): AMMONIA in the last 168 hours. Coagulation profile No results for input(s): INR, PROTIME in the last 168 hours.  CBC: Recent Labs  Lab 05/05/21 2145  WBC 8.6  HGB 11.5*  HCT 38.3  MCV 87.2  PLT 255   Cardiac Enzymes: No results for input(s): CKTOTAL, CKMB, CKMBINDEX, TROPONINI in the last 168 hours. BNP (last 3 results) No results for input(s): PROBNP in the last 8760 hours. CBG: Recent Labs  Lab 05/08/21 1154 05/08/21 1640 05/08/21 2019 05/09/21 0827 05/09/21 1135  GLUCAP 161* 190* 132* 159* 149*   D-Dimer: No results for input(s): DDIMER in the  last 72 hours. Hgb A1c: No results for input(s): HGBA1C in the last 72 hours. Lipid Profile: No results for input(s): CHOL, HDL, LDLCALC, TRIG, CHOLHDL, LDLDIRECT in the last 72 hours. Thyroid function studies: No results for input(s): TSH, T4TOTAL, T3FREE, THYROIDAB in the last 72 hours.  Invalid input(s): FREET3 Anemia work up: No results for input(s): VITAMINB12, FOLATE, FERRITIN, TIBC, IRON, RETICCTPCT in the last 72 hours. Sepsis Labs: Recent Labs  Lab 05/05/21 2145  WBC 8.6    Microbiology Recent Results (  from the past 240 hour(s))  Urine Culture     Status: Abnormal   Collection Time: 05/01/21  6:23 PM   Specimen: Urine, Random  Result Value Ref Range Status   Specimen Description   Final    URINE, RANDOM Performed at [facility], [address] [place], [place] [address]    Special Requests   Final    NONE Performed at [facility], [address] [place], [place] [address]    Culture (A)  Final    <10,000 COLONIES/mL INSIGNIFICANT GROWTH Performed at [facility] [address] [place], [place] [address]    Report Status 05/03/2021 FINAL  Final  Resp Panel by RT-PCR (Flu A&B, Covid) Nasopharyngeal Swab     Status: Abnormal   Collection Time: 05/01/21 10:08 PM   Specimen: Nasopharyngeal Swab; Nasopharyngeal(NP) swabs in vial transport medium  Result Value Ref Range Status   SARS Coronavirus 2 by RT PCR POSITIVE (A) NEGATIVE Final    Comment: (NOTE) SARS-CoV-2 target nucleic acids are DETECTED.  The SARS-CoV-2 RNA is generally detectable in upper respiratory specimens during the acute phase of infection. Positive results are indicative of the presence of the identified virus, but do not rule out bacterial infection or co-infection with other pathogens not detected by the test. Clinical correlation with patient history and other diagnostic information is necessary to determine patient infection status. The expected result is  Negative.  Fact Sheet for Patients: EntrepreneurPulse.com.au  Fact Sheet for Healthcare Providers: IncredibleEmployment.be  This test is not yet approved or cleared by the [place] FDA and  has been authorized for detection and/or diagnosis of SARS-CoV-2 by FDA under an Emergency Use Authorization (EUA).  This EUA will remain in effect (meaning this test can be used) for the duration of  the COVID-19 declaration under Section 564(b)(1) of the A ct, 21 U.S.C. section 360bbb-3(b)(1), unless the authorization is terminated or revoked sooner.     Influenza A by PCR NEGATIVE NEGATIVE Final   Influenza B by PCR NEGATIVE NEGATIVE Final    Comment: (NOTE) The Xpert Xpress SARS-CoV-2/FLU/RSV plus assay is intended as an aid in the diagnosis of influenza from Nasopharyngeal swab specimens and should not be used as a sole basis for treatment. Nasal washings and aspirates are unacceptable for Xpert Xpress SARS-CoV-2/FLU/RSV testing.  Fact Sheet for Patients: EntrepreneurPulse.com.au  Fact Sheet for Healthcare Providers: IncredibleEmployment.be  This test is not yet approved or cleared by the [place] FDA and has been authorized for detection and/or diagnosis of SARS-CoV-2 by FDA under an Emergency Use Authorization (EUA). This EUA will remain in effect (meaning this test can be used) for the duration of the COVID-19 declaration under Section 564(b)(1) of the Act, 21 U.S.C. section 360bbb-3(b)(1), unless the authorization is terminated or revoked.  Performed at [facility], [address] [place], [place] [address]     Procedures and diagnostic studies:  No results found.             LOS: 8 days   Karla Sanchez  [facility]   Pager on [email or web address]. If 7PM-7AM, please contact night-coverage at [email or web address]     05/09/2021, 1:28 PM

## 2021-05-09 NOTE — TOC Progression Note (Addendum)
Transition of Care [facility]) - Progression Note    Patient Details  Name: Karla Sanchez MRN: [id number] Date of Birth: [birth year]  Transition [facility] CM/SW [place], LCSW Phone Number: 05/09/2021, 12:04 PM  Clinical Narrative:   [facility] is able to offer a bed but cannot accept until Monday which will be day 11 from positive COVID test. Tried calling patient in the room to notify but no answer. Will try again later.  12:42 pm: Patient has accepted bed offer.  Expected Discharge Plan: [place] Barriers to Discharge: Continued Medical Work up  Expected Discharge Plan and Services Expected Discharge Plan: [place]   Discharge Planning Services: CM Consult Post Acute Care Choice: [place] Living arrangements for the past 2 months: Hotel/Motel Expected Discharge Date: 05/09/21               DME Arranged: N/A DME Agency: NA                   Social Determinants of Health (SDOH) Interventions    Readmission Risk Interventions Readmission Risk Prevention Plan 06/10/2020 10/24/2019  Transportation Screening Complete -  Medication Review Press photographer) Complete Complete  PCP or Specialist appointment within 3-5 days of discharge Complete -  [place] or Home Care Consult Complete Complete  SW Recovery Care/Counseling Consult Complete -  Palliative Care Screening Not Applicable -  [place] Complete Patient Refused  Some recent data might be hidden

## 2021-05-09 NOTE — Progress Notes (Signed)
Occupational Therapy Treatment Patient Details Name: Karla Sanchez MRN: [id number] DOB: Dec 21, 1940 Today's Date: 05/09/2021   History of present illness Pt is an 81 y.o. female with medical history significant for HFpEF, colon cancer status post left colectomy, COPD, type 2 diabetes mellitus, diastolic heart failure, gout and hypertension, who presented to the ER with acute onset of diarrhea and associated abdominal pain.  She also been complaining of short of breath and a paroxysmal active dyspnea.  She diagnosed with acute on chronic diastolic congestive heart failure. noted for recent hospitalizations ([name] discharged from there and pt drove to [facility], and a hospital in [place] prior to that per patient).   OT comments  Ms. Karla Sanchez presents today with generalized weakness, limited endurance, impaired balance, mild pain, and fatigue. She is able to engage in grooming, upper body dressing, weight-shifting and therex in sitting, standing balance tolerance, with SUPV-Min A. Pt able to tolerate very limited standing, citing pain in LE/feet. She is A&O x 4, states she is eager to DC to SNF setting to work on improving her strength and endurance. Pt reports that she had transferred alone this AM from bed to recliner. Educated pt on location of call bells and requested that she contact staff and have someone present in room before she attempts any further transfers. Pt verbalized agreement. Continue to offer OT in acute setting upon DC to SNF.   Recommendations for follow up therapy are one component of a multi-disciplinary discharge planning process, led by the attending physician.  Recommendations may be updated based on patient status, additional functional criteria and insurance authorization.    Follow Up Recommendations  Skilled nursing-short term rehab (<3 hours/day)    Assistance Recommended at Discharge Frequent or constant Supervision/Assistance  Patient can return home with the  following  A lot of help with walking and/or transfers;A little help with bathing/dressing/bathroom;Assistance with cooking/housework;Direct supervision/assist for medications management;Assist for transportation;Help with stairs or ramp for entrance   Equipment Recommendations       Recommendations for Other Services      Precautions / Restrictions Precautions Precautions: Fall Precaution Comments: airborne and contact precautions Restrictions Weight Bearing Restrictions: No       Mobility Bed Mobility               General bed mobility comments: Pt received/left in recliner. Reports that she transferred earlier today to recliner by herself, no staff in room    Transfers                         Balance Overall balance assessment: Needs assistance Sitting-balance support: No upper extremity supported, Feet supported Sitting balance-Leahy Scale: Good     Standing balance support: Bilateral upper extremity supported, Reliant on assistive device for balance Standing balance-Leahy Scale: Fair Standing balance comment: Pt reports standing/transfers very difficult for her, that gout "has ruined all the bones in my feet"                           ADL either performed or assessed with clinical judgement   ADL Overall ADL's : Needs assistance/impaired Eating/Feeding: Independent;Set up   Grooming: Wash/dry hands;Wash/dry face;Brushing hair;Set up;Sitting;Modified independent           Upper Body Dressing : Set up;Minimal assistance;Sitting Upper Body Dressing Details (indicate cue type and reason): don clean gown  Extremity/Trunk Assessment Upper Extremity Assessment Upper Extremity Assessment: Generalized weakness   Lower Extremity Assessment Lower Extremity Assessment: Generalized weakness        Vision Patient Visual Report: No change from baseline     Perception     Praxis      Cognition  Arousal/Alertness: Awake/alert Behavior During Therapy: WFL for tasks assessed/performed Overall Cognitive Status: No family/caregiver present to determine baseline cognitive functioning                                 General Comments: Pt A&Ox4        Exercises Other Exercises Other Exercises: seated therex, grooming, standing balance tolerance, repositioning, educ re: DC recs, home modifications    Shoulder Instructions       General Comments      Pertinent Vitals/ Pain       Pain Assessment Pain Assessment: 0-10 Faces Pain Scale: Hurts a little bit Pain Location: buttocks Pain Descriptors / Indicators: Discomfort Pain Intervention(s): Repositioned, Monitored during session  Home Living                                          Prior Functioning/Environment              Frequency           Progress Toward Goals  OT Goals(current goals can now be found in the care plan section)  Progress towards OT goals: Progressing toward goals  Acute Rehab OT Goals Patient Stated Goal: to feel better OT Goal Formulation: With patient Time For Goal Achievement: 05/17/21 Potential to Achieve Goals: Good  Plan Discharge plan remains appropriate;Frequency remains appropriate    Co-evaluation                 AM-PAC OT "6 Clicks" Daily Activity     Outcome Measure   Help from another person eating meals?: None Help from another person taking care of personal grooming?: A Little Help from another person toileting, which includes using toliet, bedpan, or urinal?: A Lot Help from another person bathing (including washing, rinsing, drying)?: A Little Help from another person to put on and taking off regular upper body clothing?: A Little Help from another person to put on and taking off regular lower body clothing?: A Lot 6 Click Score: 17    End of Session    OT Visit Diagnosis: Muscle weakness (generalized) (M62.81)   Activity  Tolerance Patient tolerated treatment well   Patient Left in chair;with call bell/phone within reach;with chair alarm set   Nurse Communication          Time: 0962-8366 OT Time Calculation (min): 20 min  Charges: OT General Charges $OT Visit: 1 Visit OT Treatments $Self Care/Home Management : 8-22 mins  Josiah Lobo, PhD, MS, OTR/L 05/09/21, 1:47 PM

## 2021-05-09 NOTE — Progress Notes (Signed)
Patient refused vital signs at this time 

## 2021-05-09 NOTE — Progress Notes (Signed)
PT Cancellation Note  Patient Details Name: Karla Sanchez MRN: [id number] DOB: 07-08-1940   Cancelled Treatment:    Reason Eval/Treat Not Completed: Fatigue/lethargy limiting ability to participate.  Chart reviewed.  Pt resting in bed upon PT arrival.  Pt reports being exhausted and recently returned from sitting in recliner to laying down in bed and needed to sleep at this time d/t not sleeping well last couple nights.  Therapist attempted to encourage pt to participate in therapy but pt continued to refuse and requesting to rest/sleep instead.  Will re-attempt PT session at a later date/time.  Leitha Bleak, PT 05/09/21, 10:39 AM

## 2021-05-09 NOTE — Discharge Summary (Addendum)
Physician Discharge Summary  Karla Sanchez [facility] DDU:[id number] DOB: 1941-03-21 DOA: 05/01/2021  PCP: Alfredia Client, MD  Admit date: 05/01/2021 Discharge date: 05/09/2021  Discharge disposition: SNF   Recommendations for Outpatient Follow-Up:   Follow-up with physician at the nursing home within 3 days of discharge Follow-up with PCP for routine health maintenance    Discharge Diagnosis:   Principal Problem:   Acute on chronic diastolic CHF (congestive heart failure) (HCC) Active Problems:   COPD (chronic obstructive pulmonary disease) (HCC)   Obesity, Class III, BMI 40-49.9 (morbid obesity) ([place])   Hypertension   Chronic respiratory failure with hypoxia ([place])   Type 2 diabetes mellitus without complication, with long-term current use of insulin (HCC)   Physical deconditioning   Gastroenteritis due to COVID-19 virus   Acute cystitis   Chronic kidney disease, stage 3a ([place])   Hypomagnesemia    Discharge Condition: Stable.  Diet recommendation:  Diet Order             Diet - low sodium heart healthy           Diet Carb Modified Fluid consistency: Thin; Room service appropriate? Yes  Diet effective now                     Code Status: DNR     Hospital Course:   Ms. Karla Sanchez is a 81 y.o. female with medical history significant for recent COVID-19 infection (positive test on 04/30/2021), colon cancer status post left colectomy, COPD, type 2 diabetes mellitus, chronic diastolic heart failure, gout and hypertension, who presented to the ER with acute onset of diarrhea and associated abdominal pain.  She also complained of shortness of breath and paroxysmal nocturnal dyspnea.   She was admitted to the hospital for gastroenteritis from COVID-19 infection and acute on chronic diastolic CHF complicated by acute hypoxic respiratory failure.  She was treated with IV Lasix and oxygen via nasal cannula.  She also received a dose of IV Rocephin  for suspected UTI but urine culture was negative and UTI was felt to be unlikely.  She was evaluated by PT who recommended further rehabilitation at the skilled nursing facility.  Her condition has improved and she is deemed stable for discharge to SNF today.      Discharge Exam:    Vitals:   05/08/21 2350 05/09/21 0415 05/09/21 0828 05/09/21 1138  BP: (!) 190/58  (!) 166/67 (!) 136/58  Pulse: 69  62 (!) 55  Resp: 18  16 19   Temp: (!) 97.5 F (36.4 C)  98.4 F (36.9 C) 97.7 F (36.5 C)  TempSrc:    Oral  SpO2: 94%  98% 98%  Weight:  101.8 kg    Height:         GEN: NAD SKIN: Warm and dry EYES: No pallor or icterus ENT: MMM CV: RRR PULM: CTA B ABD: soft, obese, NT, +BS CNS: AAO x 3, non focal EXT: No edema or tenderness   The results of significant diagnostics from this hospitalization (including imaging, microbiology, ancillary and laboratory) are listed below for reference.     Procedures and Diagnostic Studies:   DG Chest Portable 1 View  Result Date: 05/01/2021 CLINICAL DATA:  Shortness of breath, right lower quadrant abdominal pain and diarrhea. EXAM: PORTABLE CHEST 1 VIEW COMPARISON:  Chest radiograph dated August 25, 2020. FINDINGS: The heart size and mediastinal contours are within normal limits. Atherosclerotic calcification of aortic arch. Coarse interstitial markings prominent in  the bilateral upper lobes concerning for mild fibrosis. Prominence of the bronchial markings concerning for bronchitis. No focal consolidation or large pleural effusion. Degenerative changes of the thoracic spine and bilateral glenohumeral osteoarthritis. IMPRESSION: 1. Mild bronchial thickening with coarse interstitial lung markings, representing bronchitis and/or pulmonary vascular congestion. No focal consolidation or pleural effusion. 2.  Atherosclerotic calcification of aortic arch. Electronically Signed   By: Keane Police D.O.   On: 05/01/2021 16:19     Labs:   Basic Metabolic  Panel: Recent Labs  Lab 05/03/21 0547 05/04/21 0434 05/05/21 0529 05/06/21 0901 05/07/21 2158  NA 143 141 141 137 138  K 4.4 4.2 4.1 4.4 4.7  CL 105 102 100 98 102  CO2 30 31 31 30 30   GLUCOSE 137* 152* 151* 220* 203*  BUN 34* 30* 27* 29* 33*  CREATININE 1.13* 1.08* 0.93 1.10* 1.39*  CALCIUM 7.9* 7.8* 8.1* 8.2* 8.2*  MG 1.6* 1.6* 1.6* 2.2 2.2   GFR Estimated Creatinine Clearance: 38.9 mL/min (A) (by C-G formula based on SCr of 1.39 mg/dL (H)). Liver Function Tests: No results for input(s): AST, ALT, ALKPHOS, BILITOT, PROT, ALBUMIN in the last 168 hours. No results for input(s): LIPASE, AMYLASE in the last 168 hours. No results for input(s): AMMONIA in the last 168 hours. Coagulation profile No results for input(s): INR, PROTIME in the last 168 hours.  CBC: Recent Labs  Lab 05/05/21 2145  WBC 8.6  HGB 11.5*  HCT 38.3  MCV 87.2  PLT 255   Cardiac Enzymes: No results for input(s): CKTOTAL, CKMB, CKMBINDEX, TROPONINI in the last 168 hours. BNP: Invalid input(s): POCBNP CBG: Recent Labs  Lab 05/08/21 0724 05/08/21 1154 05/08/21 1640 05/08/21 2019 05/09/21 0827  GLUCAP 152* 161* 190* 132* 159*   D-Dimer No results for input(s): DDIMER in the last 72 hours. Hgb A1c No results for input(s): HGBA1C in the last 72 hours. Lipid Profile No results for input(s): CHOL, HDL, LDLCALC, TRIG, CHOLHDL, LDLDIRECT in the last 72 hours. Thyroid function studies No results for input(s): TSH, T4TOTAL, T3FREE, THYROIDAB in the last 72 hours.  Invalid input(s): FREET3 Anemia work up No results for input(s): VITAMINB12, FOLATE, FERRITIN, TIBC, IRON, RETICCTPCT in the last 72 hours. Microbiology Recent Results (from the past 240 hour(s))  Urine Culture     Status: Abnormal   Collection Time: 05/01/21  6:23 PM   Specimen: Urine, Random  Result Value Ref Range Status   Specimen Description   Final    URINE, RANDOM Performed at [facility], [address],  [place], [place] [address]    Special Requests   Final    NONE Performed at [facility], [address] [place], [place] [address]    Culture (A)  Final    <10,000 COLONIES/mL INSIGNIFICANT GROWTH Performed at [facility] [address] [place], [place] [address]    Report Status 05/03/2021 FINAL  Final  Resp Panel by RT-PCR (Flu A&B, Covid) Nasopharyngeal Swab     Status: Abnormal   Collection Time: 05/01/21 10:08 PM   Specimen: Nasopharyngeal Swab; Nasopharyngeal(NP) swabs in vial transport medium  Result Value Ref Range Status   SARS Coronavirus 2 by RT PCR POSITIVE (A) NEGATIVE Final    Comment: (NOTE) SARS-CoV-2 target nucleic acids are DETECTED.  The SARS-CoV-2 RNA is generally detectable in upper respiratory specimens during the acute phase of infection. Positive results are indicative of the presence of the identified virus, but do not rule out bacterial infection  or co-infection with other pathogens not detected by the test. Clinical correlation with patient history and other diagnostic information is necessary to determine patient infection status. The expected result is Negative.  Fact Sheet for Patients: EntrepreneurPulse.com.au  Fact Sheet for Healthcare Providers: IncredibleEmployment.be  This test is not yet approved or cleared by the [place] FDA and  has been authorized for detection and/or diagnosis of SARS-CoV-2 by FDA under an Emergency Use Authorization (EUA).  This EUA will remain in effect (meaning this test can be used) for the duration of  the COVID-19 declaration under Section 564(b)(1) of the A ct, 21 U.S.C. section 360bbb-3(b)(1), unless the authorization is terminated or revoked sooner.     Influenza A by PCR NEGATIVE NEGATIVE Final   Influenza B by PCR NEGATIVE NEGATIVE Final    Comment: (NOTE) The Xpert Xpress SARS-CoV-2/FLU/RSV plus assay is intended as an aid in the diagnosis of  influenza from Nasopharyngeal swab specimens and should not be used as a sole basis for treatment. Nasal washings and aspirates are unacceptable for Xpert Xpress SARS-CoV-2/FLU/RSV testing.  Fact Sheet for Patients: EntrepreneurPulse.com.au  Fact Sheet for Healthcare Providers: IncredibleEmployment.be  This test is not yet approved or cleared by the [place] FDA and has been authorized for detection and/or diagnosis of SARS-CoV-2 by FDA under an Emergency Use Authorization (EUA). This EUA will remain in effect (meaning this test can be used) for the duration of the COVID-19 declaration under Section 564(b)(1) of the Act, 21 U.S.C. section 360bbb-3(b)(1), unless the authorization is terminated or revoked.  Performed at [facility], [address], [place], [place] [address]      Discharge Instructions:   Discharge Instructions     Diet - low sodium heart healthy   Complete by: As directed    Increase activity slowly   Complete by: As directed       Allergies as of 05/09/2021       Reactions   Codeine Nausea Only, Other (See Comments), Swelling   Other Reaction: nausea and vomiting Other reaction(s): NAUSEA   Other Other (See Comments)   Other reaction(s): SWELLING Other reaction(s): MUSCLE PAIN Muscle aches Other reaction(s): MUSCLE PAIN Muscle aches Other reaction(s): MUSCLE PAIN Muscle aches   Exenatide Nausea And Vomiting   Levofloxacin Other (See Comments)   Other Reaction: sloughing of buccal mucosa Other reaction(s): OTHER Lost skin in mouth Patient states she can take Cipro without difficulty and has taken it many times in the past Other Reaction: sloughing of buccal mucosa   Losartan Other (See Comments)   Weight gain Weight gain   Sulfa Antibiotics    Tetracyclines & Related Other (See Comments)   Statins Other (See Comments)        Medication List     STOP taking these medications     nitrofurantoin (macrocrystal-monohydrate) 100 MG capsule Commonly known as: MACROBID   Trelegy Ellipta 100-62.5-25 MCG/ACT Aepb Generic drug: Fluticasone-Umeclidin-Vilant       TAKE these medications    acetaminophen 325 MG tablet Commonly known as: TYLENOL Take 650 mg by mouth every 6 (six) hours as needed for mild pain or fever.   albuterol 108 (90 Base) MCG/ACT inhaler Commonly known as: VENTOLIN HFA Inhale 2 puffs into the lungs every 6 (six) hours as needed for wheezing or shortness of breath.   amLODipine 10 MG tablet Commonly known as: NORVASC Take 10 mg by mouth daily.   aspirin EC 81 MG tablet Take 1 tablet (81 mg total)  by mouth daily. Swallow whole.   cetirizine 10 MG tablet Commonly known as: ZYRTEC Take 10 mg by mouth daily.   fluticasone-salmeterol 100-50 MCG/ACT Aepb Commonly known as: ADVAIR Inhale 1 puff into the lungs 2 (two) times daily.   furosemide 40 MG tablet Commonly known as: LASIX Take 1 tablet (40 mg total) by mouth daily. Take 80mg  po qam and 40mg  po qpm What changed:  how much to take how to take this when to take this   metoprolol tartrate 25 MG tablet Commonly known as: LOPRESSOR Take 0.5 tablets by mouth in the morning and at bedtime.   MiraLax 17 GM/SCOOP powder Generic drug: polyethylene glycol powder Take by mouth.   Spiriva HandiHaler 18 MCG inhalation capsule Generic drug: tiotropium Place 1 capsule (18 mcg total) into inhaler and inhale daily.   traZODone 100 MG tablet Commonly known as: DESYREL Take 1 tablet by mouth at bedtime as needed.           If you experience worsening of your admission symptoms, develop shortness of breath, life threatening emergency, suicidal or homicidal thoughts you must seek medical attention immediately by calling 911 or calling your MD immediately  if symptoms less severe.   You must read complete instructions/literature along with all the possible adverse reactions/side  effects for all the medicines you take and that have been prescribed to you. Take any new medicines after you have completely understood and accept all the possible adverse reactions/side effects.    Please note   You were cared for by a hospitalist during your hospital stay. If you have any questions about your discharge medications or the care you received while you were in the hospital after you are discharged, you can call the unit and asked to speak with the hospitalist on call if the hospitalist that took care of you is not available. Once you are discharged, your primary care physician will handle any further medical issues. Please note that NO REFILLS for any discharge medications will be authorized once you are discharged, as it is imperative that you return to your primary care physician (or establish a relationship with a primary care physician if you do not have one) for your aftercare needs so that they can reassess your need for medications and monitor your lab values.       Time coordinating discharge: Greater than 30 minutes  Signed:  Annie Saephan  [facility] 05/09/2021, 11:45 AM   Pager on [email or web address]. If 7PM-7AM, please contact night-coverage at [email or web address]

## 2021-05-10 LAB — GLUCOSE, CAPILLARY
Glucose-Capillary: 151 mg/dL — ABNORMAL HIGH (ref 70–99)
Glucose-Capillary: 205 mg/dL — ABNORMAL HIGH (ref 70–99)
Glucose-Capillary: 160 mg/dL — ABNORMAL HIGH (ref 70–99)
Glucose-Capillary: 207 mg/dL — ABNORMAL HIGH (ref 70–99)

## 2021-05-10 MED ORDER — ENOXAPARIN SODIUM 60 MG/0.6ML IJ SOSY
0.5000 mg/kg | PREFILLED_SYRINGE | INTRAMUSCULAR | Status: DC
  Filled 2021-05-10: qty 0.6

## 2021-05-10 NOTE — Progress Notes (Signed)
Progress Note    Karla Sanchez  KPV:[id number] DOB: May 24, 1940  DOA: 05/01/2021 PCP: Karla Client, MD      Brief Narrative:    Medical records reviewed and are as summarized below:  Karla Sanchez is a 81 y.o. female with medical history significant for recent COVID-19 infection (positive test on 04/30/2021), colon cancer status post left colectomy, COPD, type 2 diabetes mellitus, chronic diastolic heart failure, gout and hypertension, who presented to the ER with acute onset of diarrhea and associated abdominal pain.  She also complained of shortness of breath and paroxysmal nocturnal dyspnea.   She was admitted to the hospital for gastroenteritis from COVID-19 infection and acute on chronic diastolic CHF complicated by acute hypoxic respiratory failure.  She was treated with IV Lasix and oxygen via nasal cannula.  She also received a dose of IV Rocephin for suspected UTI but urine culture was negative and UTI was felt to be unlikely.  She was evaluated by PT who recommended further rehabilitation at the skilled nursing facility.  Her condition has improved and she is deemed stable for discharge to SNF today.      Assessment/Plan:   Principal Problem:   Acute on chronic diastolic CHF (congestive heart failure) (HCC) Active Problems:   COPD (chronic obstructive pulmonary disease) (HCC)   Obesity, Class III, BMI 40-49.9 (morbid obesity) ([place])   Hypertension   Chronic respiratory failure with hypoxia (HCC)   Type 2 diabetes mellitus without complication, with long-term current use of insulin (HCC)   Physical deconditioning   Gastroenteritis due to COVID-19 virus   Acute cystitis   Chronic kidney disease, stage 3a (HCC)   Hypomagnesemia    Body mass index is 36.15 kg/m.  (Obesity)    Acute on chronic diastolic CHF complicated by acute hypoxic respiratory failure: Improved.  Continue Lasix  Abnormal urinalysis with suspected acute  UTI/cystitis: she was given 1 dose of ceftriaxone on 05/02/2021.  Urine culture did not show any significant growth.  Gastroenteritis due to COVID-19 infection:  GI symptoms have improved  COPD: Stable.  Continue bronchodilators  CKD stage IIIa: Creatinine is stable  Debility: PT recommends discharge to SNF.  Other comorbidities include type II DM with hyperglycemia, hypertension  Patient has been informed that she may be able to go to [facility] on Monday, 05/13/2021.   Diet Order             Diet - low sodium heart healthy           Diet Carb Modified Fluid consistency: Thin; Room service appropriate? Yes  Diet effective now                     Consultants: None  Procedures: None    Medications:    enoxaparin (LOVENOX) injection  0.5 mg/kg Subcutaneous Q24H   furosemide  40 mg Oral Daily   hydrocortisone   Rectal BID   insulin aspart  0-15 Units Subcutaneous TID WC   senna-docusate  2 tablet Oral BID   Continuous Infusions:   Anti-infectives (From admission, onward)    Start     Dose/Rate Route Frequency Ordered Stop   05/02/21 0130  cefTRIAXone (ROCEPHIN) 1 g in sodium chloride 0.9 % 100 mL IVPB  Status:  Discontinued        1 g 200 mL/hr over 30 Minutes Intravenous Daily 05/02/21 0115 05/02/21 0828  Family Communication/Anticipated D/C date and plan/Code Status   DVT prophylaxis:      Code Status: DNR  Family Communication: None Disposition Plan: Plan to discharge to SNF   Status is: Inpatient Remains inpatient appropriate because: Awaiting placement to SNF               Subjective:   No complaints.  No cough, shortness of breath or chest pain  Objective:    Vitals:   05/09/21 2355 05/10/21 0500 05/10/21 0831 05/10/21 1148  BP: (!) 155/56  (!) 166/85 (!) 138/51  Pulse: 71  69 (!) 52  Resp: 20  20 17   Temp: 98.3 F (36.8 C)  97.7 F (36.5 C) 97.9 F (36.6 C)  TempSrc: Oral      SpO2: 93%  94% 97%  Weight:  101.6 kg    Height:       No data found.   Intake/Output Summary (Last 24 hours) at 05/10/2021 1231 Last data filed at 05/10/2021 0930 Gross per 24 hour  Intake 360 ml  Output --  Net 360 ml   Filed Weights   05/08/21 0651 05/09/21 0415 05/10/21 0500  Weight: 107.9 kg 101.8 kg 101.6 kg    Exam:  GEN: NAD SKIN: Warm and dry EYES: EOMI ENT: MMM CV: RRR PULM: CTA B ABD: soft, obese, NT, +BS CNS: AAO x 3, non focal EXT: No edema or tenderness        Data Reviewed:   I have personally reviewed following labs and imaging studies:  Labs: Labs show the following:   Basic Metabolic Panel: Recent Labs  Lab 05/04/21 0434 05/05/21 0529 05/06/21 0901 05/07/21 2158  NA 141 141 137 138  K 4.2 4.1 4.4 4.7  CL 102 100 98 102  CO2 31 31 30 30   GLUCOSE 152* 151* 220* 203*  BUN 30* 27* 29* 33*  CREATININE 1.08* 0.93 1.10* 1.39*  CALCIUM 7.8* 8.1* 8.2* 8.2*  MG 1.6* 1.6* 2.2 2.2   GFR Estimated Creatinine Clearance: 38.8 mL/min (A) (by C-G formula based on SCr of 1.39 mg/dL (H)). Liver Function Tests: No results for input(s): AST, ALT, ALKPHOS, BILITOT, PROT, ALBUMIN in the last 168 hours.  No results for input(s): LIPASE, AMYLASE in the last 168 hours.  No results for input(s): AMMONIA in the last 168 hours. Coagulation profile No results for input(s): INR, PROTIME in the last 168 hours.  CBC: Recent Labs  Lab 05/05/21 2145  WBC 8.6  HGB 11.5*  HCT 38.3  MCV 87.2  PLT 255   Cardiac Enzymes: No results for input(s): CKTOTAL, CKMB, CKMBINDEX, TROPONINI in the last 168 hours. BNP (last 3 results) No results for input(s): PROBNP in the last 8760 hours. CBG: Recent Labs  Lab 05/09/21 1135 05/09/21 1700 05/09/21 2041 05/10/21 0832 05/10/21 1149  GLUCAP 149* 164* 155* 151* 205*   D-Dimer: No results for input(s): DDIMER in the last 72 hours. Hgb A1c: No results for input(s): HGBA1C in the last 72 hours. Lipid  Profile: No results for input(s): CHOL, HDL, LDLCALC, TRIG, CHOLHDL, LDLDIRECT in the last 72 hours. Thyroid function studies: No results for input(s): TSH, T4TOTAL, T3FREE, THYROIDAB in the last 72 hours.  Invalid input(s): FREET3 Anemia work up: No results for input(s): VITAMINB12, FOLATE, FERRITIN, TIBC, IRON, RETICCTPCT in the last 72 hours. Sepsis Labs: Recent Labs  Lab 05/05/21 2145  WBC 8.6    Microbiology Recent Results (from the past 240 hour(s))  Urine Culture     Status: Abnormal  Collection Time: 05/01/21  6:23 PM   Specimen: Urine, Random  Result Value Ref Range Status   Specimen Description   Final    URINE, RANDOM Performed at [facility], [address], [place], [place] [address]    Special Requests   Final    NONE Performed at [facility], [address] [place], [place] [address]    Culture (A)  Final    <10,000 COLONIES/mL INSIGNIFICANT GROWTH Performed at [facility] [address], [place], [place] [address]    Report Status 05/03/2021 FINAL  Final  Resp Panel by RT-PCR (Flu A&B, Covid) Nasopharyngeal Swab     Status: Abnormal   Collection Time: 05/01/21 10:08 PM   Specimen: Nasopharyngeal Swab; Nasopharyngeal(NP) swabs in vial transport medium  Result Value Ref Range Status   SARS Coronavirus 2 by RT PCR POSITIVE (A) NEGATIVE Final    Comment: (NOTE) SARS-CoV-2 target nucleic acids are DETECTED.  The SARS-CoV-2 RNA is generally detectable in upper respiratory specimens during the acute phase of infection. Positive results are indicative of the presence of the identified virus, but do not rule out bacterial infection or co-infection with other pathogens not detected by the test. Clinical correlation with patient history and other diagnostic information is necessary to determine patient infection status. The expected result is Negative.  Fact Sheet for Patients: EntrepreneurPulse.com.au  Fact  Sheet for Healthcare Providers: IncredibleEmployment.be  This test is not yet approved or cleared by the [place] FDA and  has been authorized for detection and/or diagnosis of SARS-CoV-2 by FDA under an Emergency Use Authorization (EUA).  This EUA will remain in effect (meaning this test can be used) for the duration of  the COVID-19 declaration under Section 564(b)(1) of the A ct, 21 U.S.C. section 360bbb-3(b)(1), unless the authorization is terminated or revoked sooner.     Influenza A by PCR NEGATIVE NEGATIVE Final   Influenza B by PCR NEGATIVE NEGATIVE Final    Comment: (NOTE) The Xpert Xpress SARS-CoV-2/FLU/RSV plus assay is intended as an aid in the diagnosis of influenza from Nasopharyngeal swab specimens and should not be used as a sole basis for treatment. Nasal washings and aspirates are unacceptable for Xpert Xpress SARS-CoV-2/FLU/RSV testing.  Fact Sheet for Patients: EntrepreneurPulse.com.au  Fact Sheet for Healthcare Providers: IncredibleEmployment.be  This test is not yet approved or cleared by the [place] FDA and has been authorized for detection and/or diagnosis of SARS-CoV-2 by FDA under an Emergency Use Authorization (EUA). This EUA will remain in effect (meaning this test can be used) for the duration of the COVID-19 declaration under Section 564(b)(1) of the Act, 21 U.S.C. section 360bbb-3(b)(1), unless the authorization is terminated or revoked.  Performed at [facility], [address] [place], [place] [address]     Procedures and diagnostic studies:  No results found.             LOS: 9 days   Lalanya Rufener  [employer], advertising on [email or web address]. If 7PM-7AM, please contact night-coverage at [email or web address]     05/10/2021, 12:31 PM

## 2021-05-11 LAB — GLUCOSE, CAPILLARY
Glucose-Capillary: 138 mg/dL — ABNORMAL HIGH (ref 70–99)
Glucose-Capillary: 176 mg/dL — ABNORMAL HIGH (ref 70–99)
Glucose-Capillary: 163 mg/dL — ABNORMAL HIGH (ref 70–99)
Glucose-Capillary: 188 mg/dL — ABNORMAL HIGH (ref 70–99)

## 2021-05-11 NOTE — Plan of Care (Signed)

## 2021-05-11 NOTE — Progress Notes (Signed)
Notified by tech pt intermittently refusing vital signs. Otherwise appears stable and asymptomatic.

## 2021-05-11 NOTE — Progress Notes (Signed)
Pt refused Lovenox see eMAR. Pt states that she will refuse 4 am vitals tonight as well. States that she wants to sleep and not be waken up at that time. Advised her that we still will check on her through out the nice as this is apart of promoting safety. Provider Foust aware.

## 2021-05-11 NOTE — Progress Notes (Signed)
Pt refused 4 am vital signs , states that she does not want them taken now. Provider B.Randol Kern notified.

## 2021-05-11 NOTE — Progress Notes (Signed)
Progress Note    Karla Sanchez  WJX:[id number] DOB: 06-21-1940  DOA: 05/01/2021 PCP: Alfredia Client, MD      Brief Narrative:    Medical records reviewed and are as summarized below:  Karla Sanchez is a 81 y.o. female with medical history significant for recent COVID-19 infection (positive test on 04/30/2021), colon cancer status post left colectomy, COPD, type 2 diabetes mellitus, chronic diastolic heart failure, gout and hypertension, who presented to the ER with acute onset of diarrhea and associated abdominal pain.  She also complained of shortness of breath and paroxysmal nocturnal dyspnea.   She was admitted to the hospital for gastroenteritis from COVID-19 infection and acute on chronic diastolic CHF complicated by acute hypoxic respiratory failure.  She was treated with IV Lasix and oxygen via nasal cannula.  She also received a dose of IV Rocephin for suspected UTI but urine culture was negative and UTI was felt to be unlikely.  She was evaluated by PT who recommended further rehabilitation at the skilled nursing facility.  Her condition has improved and she is deemed stable for discharge to SNF today.      Assessment/Plan:   Principal Problem:   Acute on chronic diastolic CHF (congestive heart failure) (HCC) Active Problems:   COPD (chronic obstructive pulmonary disease) (HCC)   Obesity, Class III, BMI 40-49.9 (morbid obesity) ([place])   Hypertension   Chronic respiratory failure with hypoxia (HCC)   Type 2 diabetes mellitus without complication, with long-term current use of insulin (HCC)   Physical deconditioning   Gastroenteritis due to COVID-19 virus   Acute cystitis   Chronic kidney disease, stage 3a (HCC)   Hypomagnesemia    Body mass index is 37.15 kg/m.  (Obesity)   Acute on chronic diastolic CHF complicated by acute hypoxic respiratory failure: Improved.  Continue Lasix  Abnormal urinalysis with suspected acute  UTI/cystitis: she was given 1 dose of ceftriaxone on 05/02/2021.  Urine culture did not show any significant growth.  Gastroenteritis due to COVID-19 infection:  GI symptoms have improved  COPD: Stable.  Continue bronchodilators  CKD stage IIIa: Creatinine is stable  Debility: PT recommends discharge to SNF.  Other comorbidities include type II DM with hyperglycemia, hypertension  Plan to discharge to SNF tomorrow   Diet Order             Diet - low sodium heart healthy           Diet Carb Modified Fluid consistency: Thin; Room service appropriate? Yes  Diet effective now                     Consultants: None  Procedures: None    Medications:    enoxaparin (LOVENOX) injection  0.5 mg/kg Subcutaneous Q24H   furosemide  40 mg Oral Daily   hydrocortisone   Rectal BID   insulin aspart  0-15 Units Subcutaneous TID WC   senna-docusate  2 tablet Oral BID   Continuous Infusions:   Anti-infectives (From admission, onward)    Start     Dose/Rate Route Frequency Ordered Stop   05/02/21 0130  cefTRIAXone (ROCEPHIN) 1 g in sodium chloride 0.9 % 100 mL IVPB  Status:  Discontinued        1 g 200 mL/hr over 30 Minutes Intravenous Daily 05/02/21 0115 05/02/21 0828              Family Communication/Anticipated D/C date and plan/Code Status   DVT prophylaxis:  Code Status: DNR  Family Communication: None Disposition Plan: Plan to discharge to SNF   Status is: Inpatient Remains inpatient appropriate because: Awaiting placement to SNF               Subjective:   Interval events noted.  She has been refusing care at times.  No shortness of breath or chest pain.  Objective:    Vitals:   05/10/21 1148 05/10/21 1646 05/10/21 1949 05/11/21 0500  BP: (!) 138/51 (!) 151/48 (!) 121/43   Pulse: (!) 52 65 64   Resp: 17 18 16    Temp: 97.9 F (36.6 C) 97.9 F (36.6 C) 97.9 F (36.6 C)   TempSrc:   Oral   SpO2: 97% 98% 98%   Weight:     104.4 kg  Height:       No data found.   Intake/Output Summary (Last 24 hours) at 05/11/2021 1500 Last data filed at 05/11/2021 1200 Gross per 24 hour  Intake 720 ml  Output 500 ml  Net 220 ml   Filed Weights   05/09/21 0415 05/10/21 0500 05/11/21 0500  Weight: 101.8 kg 101.6 kg 104.4 kg    Exam:  GEN: NAD SKIN: Warm and dry EYES: EOMI ENT: MMM CV: RRR PULM: CTA B ABD: soft, obese, NT, +BS CNS: AAO x 3, non focal EXT: No edema or tenderness        Data Reviewed:   I have personally reviewed following labs and imaging studies:  Labs: Labs show the following:   Basic Metabolic Panel: Recent Labs  Lab 05/05/21 0529 05/06/21 0901 05/07/21 2158  NA 141 137 138  K 4.1 4.4 4.7  CL 100 98 102  CO2 31 30 30   GLUCOSE 151* 220* 203*  BUN 27* 29* 33*  CREATININE 0.93 1.10* 1.39*  CALCIUM 8.1* 8.2* 8.2*  MG 1.6* 2.2 2.2   GFR Estimated Creatinine Clearance: 39.4 mL/min (A) (by C-G formula based on SCr of 1.39 mg/dL (H)). Liver Function Tests: No results for input(s): AST, ALT, ALKPHOS, BILITOT, PROT, ALBUMIN in the last 168 hours.  No results for input(s): LIPASE, AMYLASE in the last 168 hours.  No results for input(s): AMMONIA in the last 168 hours. Coagulation profile No results for input(s): INR, PROTIME in the last 168 hours.  CBC: Recent Labs  Lab 05/05/21 2145  WBC 8.6  HGB 11.5*  HCT 38.3  MCV 87.2  PLT 255   Cardiac Enzymes: No results for input(s): CKTOTAL, CKMB, CKMBINDEX, TROPONINI in the last 168 hours. BNP (last 3 results) No results for input(s): PROBNP in the last 8760 hours. CBG: Recent Labs  Lab 05/10/21 1149 05/10/21 1644 05/10/21 1933 05/11/21 0819 05/11/21 1158  GLUCAP 205* 160* 207* 138* 176*   D-Dimer: No results for input(s): DDIMER in the last 72 hours. Hgb A1c: No results for input(s): HGBA1C in the last 72 hours. Lipid Profile: No results for input(s): CHOL, HDL, LDLCALC, TRIG, CHOLHDL, LDLDIRECT in the last  72 hours. Thyroid function studies: No results for input(s): TSH, T4TOTAL, T3FREE, THYROIDAB in the last 72 hours.  Invalid input(s): FREET3 Anemia work up: No results for input(s): VITAMINB12, FOLATE, FERRITIN, TIBC, IRON, RETICCTPCT in the last 72 hours. Sepsis Labs: Recent Labs  Lab 05/05/21 2145  WBC 8.6    Microbiology Recent Results (from the past 240 hour(s))  Urine Culture     Status: Abnormal   Collection Time: 05/01/21  6:23 PM   Specimen: Urine, Random  Result Value Ref Range  Status   Specimen Description   Final    URINE, RANDOM Performed at [facility], [address] [place], [place] [address]    Special Requests   Final    NONE Performed at [facility], [address] [place], [place] [address]    Culture (A)  Final    <10,000 COLONIES/mL INSIGNIFICANT GROWTH Performed at [facility] [address], [place], [place] [address]    Report Status 05/03/2021 FINAL  Final  Resp Panel by RT-PCR (Flu A&B, Covid) Nasopharyngeal Swab     Status: Abnormal   Collection Time: 05/01/21 10:08 PM   Specimen: Nasopharyngeal Swab; Nasopharyngeal(NP) swabs in vial transport medium  Result Value Ref Range Status   SARS Coronavirus 2 by RT PCR POSITIVE (A) NEGATIVE Final    Comment: (NOTE) SARS-CoV-2 target nucleic acids are DETECTED.  The SARS-CoV-2 RNA is generally detectable in upper respiratory specimens during the acute phase of infection. Positive results are indicative of the presence of the identified virus, but do not rule out bacterial infection or co-infection with other pathogens not detected by the test. Clinical correlation with patient history and other diagnostic information is necessary to determine patient infection status. The expected result is Negative.  Fact Sheet for Patients: EntrepreneurPulse.com.au  Fact Sheet for Healthcare Providers: IncredibleEmployment.be  This test is not  yet approved or cleared by the [place] FDA and  has been authorized for detection and/or diagnosis of SARS-CoV-2 by FDA under an Emergency Use Authorization (EUA).  This EUA will remain in effect (meaning this test can be used) for the duration of  the COVID-19 declaration under Section 564(b)(1) of the A ct, 21 U.S.C. section 360bbb-3(b)(1), unless the authorization is terminated or revoked sooner.     Influenza A by PCR NEGATIVE NEGATIVE Final   Influenza B by PCR NEGATIVE NEGATIVE Final    Comment: (NOTE) The Xpert Xpress SARS-CoV-2/FLU/RSV plus assay is intended as an aid in the diagnosis of influenza from Nasopharyngeal swab specimens and should not be used as a sole basis for treatment. Nasal washings and aspirates are unacceptable for Xpert Xpress SARS-CoV-2/FLU/RSV testing.  Fact Sheet for Patients: EntrepreneurPulse.com.au  Fact Sheet for Healthcare Providers: IncredibleEmployment.be  This test is not yet approved or cleared by the [place] FDA and has been authorized for detection and/or diagnosis of SARS-CoV-2 by FDA under an Emergency Use Authorization (EUA). This EUA will remain in effect (meaning this test can be used) for the duration of the COVID-19 declaration under Section 564(b)(1) of the Act, 21 U.S.C. section 360bbb-3(b)(1), unless the authorization is terminated or revoked.  Performed at [facility], [address] [place], [place] [address]     Procedures and diagnostic studies:  No results found.             LOS: 10 days   Zylie Mumaw  [employer]   Pager on [email or web address]. If 7PM-7AM, please contact night-coverage at [email or web address]     05/11/2021, 3:00 PM

## 2021-05-12 MED ORDER — NYSTATIN 100000 UNIT/GM EX POWD
Freq: Two times a day (BID) | CUTANEOUS | Status: DC
  Filled 2021-05-12: qty 15

## 2021-05-12 MED ORDER — COLCHICINE 0.6 MG PO TABS: 0.6000 mg | ORAL_TABLET | Freq: Every day | ORAL | Status: DC

## 2021-05-12 MED ORDER — NYSTATIN 100000 UNIT/GM EX POWD: Freq: Two times a day (BID) | CUTANEOUS | 0 refills | Status: AC

## 2021-05-12 MED ORDER — COLCHICINE 0.6 MG PO TABS
0.6000 mg | ORAL_TABLET | Freq: Every day | ORAL | Status: DC
  Administered 2021-05-12: 0.6 mg via ORAL
  Filled 2021-05-12: qty 1

## 2021-05-12 MED ORDER — COLCHICINE 0.6 MG PO TABS: 0.6000 mg | ORAL_TABLET | Freq: Every day | ORAL | 0 refills | Status: AC

## 2021-05-12 MED ORDER — NYSTATIN 100000 UNIT/GM EX POWD: Freq: Two times a day (BID) | CUTANEOUS | 0 refills | Status: DC

## 2021-05-12 NOTE — Progress Notes (Addendum)
10900 patient alert able to make all needs known urinating on self refusing purwick 1200 report called to [place] at [facility], patient $650.00 brought up from locker money was counted with patient then patient placed money in her purse. 1300 patient asking that RN go to her [name] that is parked here patient informed that I could not patient also asking that her [name] be moved to [employer] I informed patient that she would have to have family/friends or tow do that if [employer] allowed for her to keep the [place] there.  1430 patient refusing to go to [facility] when EMTs arrive patient wanting to leave in her own [name] Dr changed orders for patient discharge. Patient needed to be pushed to [facility] in ER parking lot from staff because patient states she can not walk, or patient wants staff to drive her [name] to the door. Patient informed that we could not drive her [name] and that staff would take her to her [name] in the ER parking lot

## 2021-05-12 NOTE — Progress Notes (Signed)
Patient refused to go with EMS at time of transport to white [employer] and reports wanting to be wheeled down to her car so she can drive to [place].   White Larey Dresser has been updated.  Lind[place], Melbeta

## 2021-05-12 NOTE — Care Management Important Message (Signed)
Important Message  Patient Details  Name: Karla Sanchez MRN: [id number] Date of Birth: 1940-12-11   Medicare Important Message Given:  Other (see comment)  Attempted to reach patient via room phone due to isolation status to review Medicare IM.  Unable to reach patient at this time.  Also attempted to reach at cell phone listed [phone]), though unable to go through.     Dannette Barbara 05/12/2021, 11:16 AM

## 2021-05-12 NOTE — Plan of Care (Signed)
°  Problem: Education: Goal: Ability to demonstrate management of disease process will improve Outcome: Progressing Goal: Ability to verbalize understanding of medication therapies will improve Outcome: Progressing Goal: Individualized Educational Video(s) Outcome: Progressing   Problem: Activity: Goal: Capacity to carry out activities will improve Outcome: Progressing   Problem: Cardiac: Goal: Ability to achieve and maintain adequate cardiopulmonary perfusion will improve Outcome: Progressing   Problem: Education: Goal: Knowledge of General Education information will improve Description: Including pain rating scale, medication(s)/side effects and non-pharmacologic comfort measures Outcome: Progressing   Problem: Health Behavior/Discharge Planning: Goal: Ability to manage health-related needs will improve Outcome: Progressing   Problem: Clinical Measurements: Goal: Ability to maintain clinical measurements within normal limits will improve Outcome: Progressing Goal: Will remain free from infection Outcome: Progressing Goal: Diagnostic test results will improve Outcome: Progressing Goal: Respiratory complications will improve Outcome: Progressing Goal: Cardiovascular complication will be avoided Outcome: Progressing   Problem: Activity: Goal: Risk for activity intolerance will decrease Outcome: Progressing   Problem: Nutrition: Goal: Adequate nutrition will be maintained Outcome: Progressing   Problem: Coping: Goal: Level of anxiety will decrease Outcome: Progressing   Problem: Elimination: Goal: Will not experience complications related to bowel motility Outcome: Progressing Goal: Will not experience complications related to urinary retention Outcome: Progressing   Problem: Pain Managment: Goal: General experience of comfort will improve Outcome: Progressing

## 2021-05-12 NOTE — Plan of Care (Signed)

## 2021-05-12 NOTE — TOC Transition Note (Signed)
Transition of Care [facility]) - CM/SW Discharge Note   Patient Details  Name: Karla Sanchez MRN: [id number] Date of Birth: 1940-05-22  Transition of Care [facility]) CM/SW Contact:  Alberteen Sam, LCSW Phone Number: 05/12/2021, 11:49 AM   Clinical Narrative:     Patient will DC to:[facility] Anticipated DC date: 05/12/21 Transport by: Johnanna Schneiders  Per MD patient ready for DC to [facility] . RN, patient, patient's family, and facility notified of DC. Discharge Summary sent to facility. RN given number for report  [phone] Room 111P. DC packet on chart. Ambulance transport requested for patient.  CSW signing off.  Pricilla Riffle, LCSW    Final next level of care: Skilled Nursing Facility Barriers to Discharge: No Barriers Identified   Patient Goals and CMS Choice Patient states their goals for this hospitalization and ongoing recovery are:: to go home CMS Medicare.gov Compare Post Acute Care list provided to:: Patient Choice offered to / list presented to : Patient  Discharge Placement              Patient chooses bed at: [facility] Patient to be transferred to facility by: [facility]   Patient and family notified of of transfer: 05/12/21  Discharge Plan and Services   Discharge Planning Services: CM Consult Post Acute Care Choice: [place]          DME Arranged: N/A DME Agency: NA                  Social Determinants of Health ([place]) Interventions     Readmission Risk Interventions Readmission Risk Prevention Plan 06/10/2020 10/24/2019  Transportation Screening Complete -  Medication Review Press photographer) Complete Complete  PCP or Specialist appointment within 3-5 days of discharge Complete -  [place] or Home Care Consult Complete Complete  SW Recovery Care/Counseling Consult Complete -  Palliative Care Screening Not Applicable -  [place] Complete Patient Refused  Some recent data might be hidden

## 2021-05-12 NOTE — Discharge Summary (Addendum)
Physician Discharge Summary  Ryland Smoots [facility] ZOX:[id number] DOB: 1940/12/05 DOA: 05/01/2021  PCP: Alfredia Client, MD  Admit date: 05/01/2021 Discharge date: 05/12/2021  Discharge disposition: SNF   Recommendations for Outpatient Follow-Up:   Follow up with physician at the nursing home within 3 days of discharge Follow-up with PCP for routine healthcare maintenance  Discharge Diagnosis:   Principal Problem:   Acute on chronic diastolic CHF (congestive heart failure) ([place]) Active Problems:   COPD (chronic obstructive pulmonary disease) (HCC)   Obesity, Class III, BMI 40-49.9 (morbid obesity) ([place])   Hypertension   Chronic respiratory failure with hypoxia ([place])   Type 2 diabetes mellitus without complication, with long-term current use of insulin ([place])   Physical deconditioning   Gastroenteritis due to COVID-19 virus   Acute cystitis   Chronic kidney disease, stage 3a ([place])   Hypomagnesemia    Discharge Condition: Stable.  Diet recommendation:  Diet Order             Diet - low sodium heart healthy           Diet Carb Modified Fluid consistency: Thin; Room service appropriate? Yes  Diet effective now                     Code Status: DNR     Hospital Course:   Ms. Lexandra Rettke is a 81 y.o. female with medical history significant for recent COVID-19 infection (positive test on 04/30/2021), colon cancer status post left colectomy, COPD, type 2 diabetes mellitus, chronic diastolic heart failure, gout and hypertension, who presented to the ER with acute onset of diarrhea and associated abdominal pain.  She also complained of shortness of breath and paroxysmal nocturnal dyspnea.    She was admitted to the hospital for gastroenteritis from COVID-19 infection and acute on chronic diastolic CHF complicated by acute hypoxic respiratory failure.  She was treated with IV Lasix and oxygen via nasal cannula.  She also received a dose of IV Rocephin  for suspected UTI but urine culture was negative and UTI was felt to be unlikely.  She was evaluated by PT who recommended further rehabilitation at the skilled nursing facility.  She reported pain and swelling of the right middle finger (on the day of discharge) which she attributed to gout flare and requested something for this.  Colchicine was prescribed.  She also reported redness around her right breast and nystatin powder was prescribed for candidal intertrigo under right breast.  Her condition has improved and she is deemed stable for discharge to SNF today. Of note, she was previously taking Trelegy Ellipta but this has been replaced with Spiriva and Advair.  She can go back on Trelegy Ellipta after discharge from the nursing home.     ADDENDUM  Patient refused to go to the nursing home when EMS came to pick her up.  She prefers to drive herself to [place] in her [name].  Prescription for Spiriva and Advair have been discontinued.  She will go back on Trelegy Ellipta.  Disposition has been changed from SNF to home.    Discharge Exam:    Vitals:   05/11/21 0500 05/12/21 0822 05/12/21 1115 05/12/21 1401  BP:  (!) 148/128 (!) 154/52 (!) 145/60  Pulse:  66 (!) 58 70  Resp:  19 18 18   Temp:  97.7 F (36.5 C) 97.8 F (36.6 C) 97.8 F (36.6 C)  TempSrc:    Oral  SpO2:  95% 95% 96%  Weight: 104.4 kg     Height:         GEN: NAD SKIN: Warm and dry EYES: No pallor or icterus ENT: MMM CV: RRR PULM: CTA B ABD: soft, obese, NT, +BS CNS: AAO x 3, non focal EXT: No edema or tenderness   The results of significant diagnostics from this hospitalization (including imaging, microbiology, ancillary and laboratory) are listed below for reference.     Procedures and Diagnostic Studies:   DG Chest Portable 1 View  Result Date: 05/01/2021 CLINICAL DATA:  Shortness of breath, right lower quadrant abdominal pain and diarrhea. EXAM: PORTABLE CHEST 1 VIEW COMPARISON:  Chest radiograph dated  August 25, 2020. FINDINGS: The heart size and mediastinal contours are within normal limits. Atherosclerotic calcification of aortic arch. Coarse interstitial markings prominent in the bilateral upper lobes concerning for mild fibrosis. Prominence of the bronchial markings concerning for bronchitis. No focal consolidation or large pleural effusion. Degenerative changes of the thoracic spine and bilateral glenohumeral osteoarthritis. IMPRESSION: 1. Mild bronchial thickening with coarse interstitial lung markings, representing bronchitis and/or pulmonary vascular congestion. No focal consolidation or pleural effusion. 2.  Atherosclerotic calcification of aortic arch. Electronically Signed   By: Keane Police D.O.   On: 05/01/2021 16:19     Labs:   Basic Metabolic Panel: Recent Labs  Lab 05/06/21 0901 05/07/21 2158  NA 137 138  K 4.4 4.7  CL 98 102  CO2 30 30  GLUCOSE 220* 203*  BUN 29* 33*  CREATININE 1.10* 1.39*  CALCIUM 8.2* 8.2*  MG 2.2 2.2   GFR Estimated Creatinine Clearance: 39.4 mL/min (A) (by C-G formula based on SCr of 1.39 mg/dL (H)). Liver Function Tests: No results for input(s): AST, ALT, ALKPHOS, BILITOT, PROT, ALBUMIN in the last 168 hours. No results for input(s): LIPASE, AMYLASE in the last 168 hours. No results for input(s): AMMONIA in the last 168 hours. Coagulation profile No results for input(s): INR, PROTIME in the last 168 hours.  CBC: Recent Labs  Lab 05/05/21 2145  WBC 8.6  HGB 11.5*  HCT 38.3  MCV 87.2  PLT 255   Cardiac Enzymes: No results for input(s): CKTOTAL, CKMB, CKMBINDEX, TROPONINI in the last 168 hours. BNP: Invalid input(s): POCBNP CBG: Recent Labs  Lab 05/10/21 1933 05/11/21 0819 05/11/21 1158 05/11/21 1732 05/11/21 2106  GLUCAP 207* 138* 176* 163* 188*   D-Dimer No results for input(s): DDIMER in the last 72 hours. Hgb A1c No results for input(s): HGBA1C in the last 72 hours. Lipid Profile No results for input(s): CHOL, HDL,  LDLCALC, TRIG, CHOLHDL, LDLDIRECT in the last 72 hours. Thyroid function studies No results for input(s): TSH, T4TOTAL, T3FREE, THYROIDAB in the last 72 hours.  Invalid input(s): FREET3 Anemia work up No results for input(s): VITAMINB12, FOLATE, FERRITIN, TIBC, IRON, RETICCTPCT in the last 72 hours. Microbiology No results found for this or any previous visit (from the past 240 hour(s)).   Discharge Instructions:   Discharge Instructions     Diet - low sodium heart healthy   Complete by: As directed    Increase activity slowly   Complete by: As directed       Allergies as of 05/12/2021       Reactions   Codeine Nausea Only, Other (See Comments), Swelling   Other Reaction: nausea and vomiting Other reaction(s): NAUSEA   Other Other (See Comments)   Other reaction(s): SWELLING Other reaction(s): MUSCLE PAIN Muscle aches Other reaction(s): MUSCLE PAIN Muscle aches Other reaction(s): MUSCLE PAIN  Muscle aches   Exenatide Nausea And Vomiting   Levofloxacin Other (See Comments)   Other Reaction: sloughing of buccal mucosa Other reaction(s): OTHER Lost skin in mouth Patient states she can take Cipro without difficulty and has taken it many times in the past Other Reaction: sloughing of buccal mucosa   Losartan Other (See Comments)   Weight gain Weight gain   Sulfa Antibiotics    Tetracyclines & Related Other (See Comments)   Statins Other (See Comments)        Medication List     STOP taking these medications    nitrofurantoin (macrocrystal-monohydrate) 100 MG capsule Commonly known as: MACROBID       TAKE these medications    acetaminophen 325 MG tablet Commonly known as: TYLENOL Take 650 mg by mouth every 6 (six) hours as needed for mild pain or fever.   albuterol 108 (90 Base) MCG/ACT inhaler Commonly known as: VENTOLIN HFA Inhale 2 puffs into the lungs every 6 (six) hours as needed for wheezing or shortness of breath.   amLODipine 10 MG  tablet Commonly known as: NORVASC Take 10 mg by mouth daily.   aspirin EC 81 MG tablet Take 1 tablet (81 mg total) by mouth daily. Swallow whole.   cetirizine 10 MG tablet Commonly known as: ZYRTEC Take 10 mg by mouth daily.   colchicine 0.6 MG tablet Take 1 tablet (0.6 mg total) by mouth daily. Start taking on: May 13, 2021   furosemide 40 MG tablet Commonly known as: LASIX Take 1 tablet (40 mg total) by mouth daily. Take 80mg  po qam and 40mg  po qpm What changed:  how much to take how to take this when to take this   metoprolol tartrate 25 MG tablet Commonly known as: LOPRESSOR Take 0.5 tablets by mouth in the morning and at bedtime.   MiraLax 17 GM/SCOOP powder Generic drug: polyethylene glycol powder Take by mouth.   nystatin powder Commonly known as: MYCOSTATIN/NYSTOP Apply topically 2 (two) times daily for 5 days. Apply to area under right breast   traZODone 100 MG tablet Commonly known as: DESYREL Take 1 tablet by mouth at bedtime as needed.   Trelegy Ellipta 100-62.5-25 MCG/ACT Aepb Generic drug: Fluticasone-Umeclidin-Vilant Inhale 1 puff into the lungs daily.        Contact information for after-discharge care     Destination     [facility] [place] [facility] .   Service: Skilled Nursing Contact information: [address] [place] [phone]                       If you experience worsening of your admission symptoms, develop shortness of breath, life threatening emergency, suicidal or homicidal thoughts you must seek medical attention immediately by calling 911 or calling your MD immediately  if symptoms less severe.   You must read complete instructions/literature along with all the possible adverse reactions/side effects for all the medicines you take and that have been prescribed to you. Take any new medicines after you have completely understood and accept all the possible adverse  reactions/side effects.    Please note   You were cared for by a hospitalist during your hospital stay. If you have any questions about your discharge medications or the care you received while you were in the hospital after you are discharged, you can call the unit and asked to speak with the hospitalist on call if the hospitalist that took care of  you is not available. Once you are discharged, your primary care physician will handle any further medical issues. Please note that NO REFILLS for any discharge medications will be authorized once you are discharged, as it is imperative that you return to your primary care physician (or establish a relationship with a primary care physician if you do not have one) for your aftercare needs so that they can reassess your need for medications and monitor your lab values.       Time coordinating discharge: Greater than 30 minutes  Signed:  Kaitrin Seybold  [facility] 05/12/2021, 2:32 PM   Pager on [email or web address]. If 7PM-7AM, please contact night-coverage at [email or web address]

## 2021-05-12 NOTE — Plan of Care (Signed)
Problem: Education: Goal: Ability to demonstrate management of disease process will improve 05/12/2021 1453 by Neomia Glass, RN Outcome: Adequate for Discharge 05/12/2021 1058 by Neomia Glass, RN Outcome: Progressing Goal: Ability to verbalize understanding of medication therapies will improve 05/12/2021 1453 by Neomia Glass, RN Outcome: Adequate for Discharge 05/12/2021 1058 by Neomia Glass, RN Outcome: Progressing Goal: Individualized Educational Video(s) 05/12/2021 1453 by Neomia Glass, RN Outcome: Adequate for Discharge 05/12/2021 1058 by Neomia Glass, RN Outcome: Progressing   Problem: Activity: Goal: Capacity to carry out activities will improve 05/12/2021 1453 by Neomia Glass, RN Outcome: Adequate for Discharge 05/12/2021 1058 by Neomia Glass, RN Outcome: Progressing   Problem: Cardiac: Goal: Ability to achieve and maintain adequate cardiopulmonary perfusion will improve 05/12/2021 1453 by Neomia Glass, RN Outcome: Adequate for Discharge 05/12/2021 1058 by Neomia Glass, RN Outcome: Progressing   Problem: Education: Goal: Knowledge of General Education information will improve Description: Including pain rating scale, medication(s)/side effects and non-pharmacologic comfort measures 05/12/2021 1453 by Neomia Glass, RN Outcome: Adequate for Discharge 05/12/2021 1058 by Neomia Glass, RN Outcome: Progressing   Problem: Health Behavior/Discharge Planning: Goal: Ability to manage health-related needs will improve 05/12/2021 1453 by Neomia Glass, RN Outcome: Adequate for Discharge 05/12/2021 1058 by Neomia Glass, RN Outcome: Progressing   Problem: Clinical Measurements: Goal: Ability to maintain clinical measurements within normal limits will improve 05/12/2021 1453 by Neomia Glass, RN Outcome: Adequate for Discharge 05/12/2021 1058 by Neomia Glass, RN Outcome: Progressing Goal: Will remain free from infection 05/12/2021 1453 by  Neomia Glass, RN Outcome: Adequate for Discharge 05/12/2021 1058 by Neomia Glass, RN Outcome: Progressing Goal: Diagnostic test results will improve 05/12/2021 1453 by Neomia Glass, RN Outcome: Adequate for Discharge 05/12/2021 1058 by Neomia Glass, RN Outcome: Progressing Goal: Respiratory complications will improve 05/12/2021 1453 by Neomia Glass, RN Outcome: Adequate for Discharge 05/12/2021 1058 by Neomia Glass, RN Outcome: Progressing Goal: Cardiovascular complication will be avoided 05/12/2021 1453 by Neomia Glass, RN Outcome: Adequate for Discharge 05/12/2021 1058 by Neomia Glass, RN Outcome: Progressing   Problem: Activity: Goal: Risk for activity intolerance will decrease 05/12/2021 1453 by Neomia Glass, RN Outcome: Adequate for Discharge 05/12/2021 1058 by Neomia Glass, RN Outcome: Progressing   Problem: Nutrition: Goal: Adequate nutrition will be maintained 05/12/2021 1453 by Neomia Glass, RN Outcome: Adequate for Discharge 05/12/2021 1058 by Neomia Glass, RN Outcome: Progressing   Problem: Coping: Goal: Level of anxiety will decrease 05/12/2021 1453 by Neomia Glass, RN Outcome: Adequate for Discharge 05/12/2021 1058 by Neomia Glass, RN Outcome: Progressing   Problem: Elimination: Goal: Will not experience complications related to bowel motility 05/12/2021 1453 by Neomia Glass, RN Outcome: Adequate for Discharge 05/12/2021 1058 by Neomia Glass, RN Outcome: Progressing Goal: Will not experience complications related to urinary retention 05/12/2021 1453 by Neomia Glass, RN Outcome: Adequate for Discharge 05/12/2021 1058 by Neomia Glass, RN Outcome: Progressing   Problem: Pain Managment: Goal: General experience of comfort will improve 05/12/2021 1453 by Neomia Glass, RN Outcome: Adequate for Discharge 05/12/2021 1058 by Neomia Glass, RN Outcome: Progressing   Problem: Safety: Goal: Ability to remain free  from injury will improve 05/12/2021 1453 by Neomia Glass, RN Outcome: Adequate for Discharge 05/12/2021 1058 by Neomia Glass, RN Outcome: Not Progressing Note: Patient keeps transferring self with out calling for help  Problem: Skin Integrity: Goal: Risk for impaired skin integrity will decrease 05/12/2021 1453 by Neomia Glass, RN Outcome: Adequate for Discharge 05/12/2021 1058 by Neomia Glass, RN Outcome: Not Progressing Note: Patient urinates on self and doesn't tell staff

## 2021-05-12 NOTE — Progress Notes (Deleted)
PT Cancellation Note  Patient Details Name: Karla Sanchez MRN: [id number] DOB: July 18, 1940   Cancelled Treatment:    Reason Eval/Treat Not Completed: Patient at procedure or test/unavailable Due to patient being being off the floor, will hold on PT at this time, and will re-attempt at a later time/date as available. Thank you!   Iva Boop, PT  05/12/21. 10:37 AM

## 2021-05-14 ENCOUNTER — Encounter: Payer: Self-pay | Admitting: Pharmacist

## 2021-05-14 ENCOUNTER — Telehealth: Payer: Self-pay | Admitting: Family

## 2021-05-14 ENCOUNTER — Ambulatory Visit: Payer: Federal, State, Local not specified - PPO | Admitting: Family

## 2021-05-14 NOTE — Progress Notes (Incomplete)
Karla Sanchez - PHARMACIST COUNSELING NOTE  Guideline-Directed Medical Therapy/Evidence Based Medicine  ACE/ARB/ARNI: {AR_ARNI/ACEi/ARB:25599} Beta Blocker: Metoprolol tartrate 12.5 mg twice daily Aldosterone Antagonist: {AR_Mineralocorticoid receptor antagonists:25602} Diuretic: Furosemide 40 mg daily SGLT2i: {AR_SGLT2i:25603}  Adherence Assessment  Do you ever forget to take your medication? [] Yes [] No  Do you ever skip doses due to side effects? [] Yes [] No  Do you have trouble affording your medicines? [] Yes [] No  Are you ever unable to pick up your medication due to transportation difficulties? [] Yes [] No  Do you ever stop taking your medications because you don't believe they are helping? [] Yes [] No  Do you check your weight daily? [] Yes [] No   Adherence strategy: ***  Barriers to obtaining medications: ***  Vital signs: HR ***, BP ***, weight (pounds) *** ECHO: Date ***, EF ***, notes *** Cath: Date ***, EF ***, notes ***  BMP Latest Ref Rng & Units 05/07/2021 05/06/2021 05/05/2021  Glucose 70 - 99 mg/dL 203(H) 220(H) 151(H)  BUN 8 - 23 mg/dL 33(H) 29(H) 27(H)  Creatinine 0.44 - 1.00 mg/dL 1.39(H) 1.10(H) 0.93  Sodium 135 - 145 mmol/L 138 137 141  Potassium 3.5 - 5.1 mmol/L 4.7 4.4 4.1  Chloride 98 - 111 mmol/L 102 98 100  CO2 22 - 32 mmol/L 30 30 31   Calcium 8.9 - 10.3 mg/dL 8.2(L) 8.2(L) 8.1(L)    Past Medical History:  Diagnosis Date   CHF (congestive heart failure) (HCC)    Colon cancer (HCC)    s/p right and left colectomy 2020   COPD (chronic obstructive pulmonary disease) (HCC)    Diabetes mellitus without complication (HCC)    Diastolic heart failure (HCC)    Gout    Hypertension     ASSESSMENT *** year old {Gender Description:[id number]} who presents to the HF clinic ***  Recent ED Visit (past 6 months): Date - ***, CC - ***  PLAN    Time spent: *** minutes  Karla Sanchez PharmD,  BCPS 05/14/2021 8:28 AM   Current Outpatient Medications:    acetaminophen (TYLENOL) 325 MG tablet, Take 650 mg by mouth every 6 (six) hours as needed for mild pain or fever. , Disp: , Rfl:    albuterol (VENTOLIN HFA) 108 (90 Base) MCG/ACT inhaler, Inhale 2 puffs into the lungs every 6 (six) hours as needed for wheezing or shortness of breath., Disp: 8 g, Rfl: 0   amLODipine (NORVASC) 10 MG tablet, Take 10 mg by mouth daily., Disp: , Rfl:    aspirin EC 81 MG tablet, Take 1 tablet (81 mg total) by mouth daily. Swallow whole., Disp: 30 tablet, Rfl: 0   cetirizine (ZYRTEC) 10 MG tablet, Take 10 mg by mouth daily., Disp: , Rfl:    colchicine 0.6 MG tablet, Take 1 tablet (0.6 mg total) by mouth daily., Disp: 30 tablet, Rfl: 0   furosemide (LASIX) 40 MG tablet, Take 1 tablet (40 mg total) by mouth daily. Take 80mg  po qam and 40mg  po qpm, Disp: , Rfl:    metoprolol tartrate (LOPRESSOR) 25 MG tablet, Take 0.5 tablets by mouth in the morning and at bedtime., Disp: , Rfl:    MIRALAX 17 GM/SCOOP powder, Take by mouth., Disp: , Rfl:    nystatin (MYCOSTATIN/NYSTOP) powder, Apply topically 2 (two) times daily for 5 days. Apply to area under right breast, Disp: 15 g, Rfl: 0   traZODone (DESYREL) 100 MG tablet, Take 1 tablet by mouth at bedtime as needed., Disp: , Rfl:  TRELEGY ELLIPTA 100-62.5-25 MCG/ACT AEPB, Inhale 1 puff into the lungs daily., Disp: , Rfl:   MEDICATION ADHERENCES TIPS AND STRATEGIES Taking medication as prescribed improves patient outcomes in heart failure (reduces hospitalizations, improves symptoms, increases survival) Side effects of medications can be managed by decreasing doses, switching agents, stopping drugs, or adding additional therapy. Please let someone in the [facility] know if you have having bothersome side effects so we can modify your regimen. Do not alter your medication regimen without talking to [place].  Medication reminders can help patients remember to take  drugs on time. If you are missing or forgetting doses you can try linking behaviors, using pill boxes, or an electronic reminder like an alarm on your phone or an app. Some people can also get automated phone calls as medication reminders.

## 2021-05-14 NOTE — Telephone Encounter (Signed)
Patient did not show for her [facility] appointment on 05/14/21. Will attempt to reschedule.

## 2021-05-16 ENCOUNTER — Telehealth: Payer: Self-pay | Admitting: Family

## 2021-05-16 NOTE — Telephone Encounter (Signed)
Unable to reach patient in attempt to reschedule patients no show appointment with the [facility] on 2/22.   Karla Sanchez, NT

## 2021-09-02 ENCOUNTER — Other Ambulatory Visit: Payer: Self-pay

## 2021-09-02 ENCOUNTER — Encounter: Payer: Self-pay | Admitting: *Deleted

## 2021-09-02 ENCOUNTER — Emergency Department: Payer: Medicare Other

## 2021-09-02 DIAGNOSIS — Z85038 Personal history of other malignant neoplasm of large intestine: Secondary | ICD-10-CM | POA: Diagnosis not present

## 2021-09-02 DIAGNOSIS — N1832 Chronic kidney disease, stage 3b: Secondary | ICD-10-CM | POA: Diagnosis not present

## 2021-09-02 DIAGNOSIS — N179 Acute kidney failure, unspecified: Secondary | ICD-10-CM | POA: Diagnosis not present

## 2021-09-02 DIAGNOSIS — E669 Obesity, unspecified: Secondary | ICD-10-CM | POA: Insufficient documentation

## 2021-09-02 DIAGNOSIS — I130 Hypertensive heart and chronic kidney disease with heart failure and stage 1 through stage 4 chronic kidney disease, or unspecified chronic kidney disease: Secondary | ICD-10-CM | POA: Insufficient documentation

## 2021-09-02 DIAGNOSIS — J449 Chronic obstructive pulmonary disease, unspecified: Secondary | ICD-10-CM | POA: Insufficient documentation

## 2021-09-02 DIAGNOSIS — Z79899 Other long term (current) drug therapy: Secondary | ICD-10-CM | POA: Insufficient documentation

## 2021-09-02 DIAGNOSIS — Z6835 Body mass index (BMI) 35.0-35.9, adult: Secondary | ICD-10-CM | POA: Insufficient documentation

## 2021-09-02 DIAGNOSIS — R0602 Shortness of breath: Secondary | ICD-10-CM | POA: Diagnosis present

## 2021-09-02 DIAGNOSIS — N390 Urinary tract infection, site not specified: Secondary | ICD-10-CM | POA: Diagnosis not present

## 2021-09-02 DIAGNOSIS — I5032 Chronic diastolic (congestive) heart failure: Secondary | ICD-10-CM | POA: Diagnosis not present

## 2021-09-02 DIAGNOSIS — R109 Unspecified abdominal pain: Secondary | ICD-10-CM | POA: Insufficient documentation

## 2021-09-02 DIAGNOSIS — E875 Hyperkalemia: Secondary | ICD-10-CM | POA: Diagnosis not present

## 2021-09-02 DIAGNOSIS — E1122 Type 2 diabetes mellitus with diabetic chronic kidney disease: Secondary | ICD-10-CM | POA: Insufficient documentation

## 2021-09-02 LAB — BASIC METABOLIC PANEL
Anion gap: 6 (ref 5–15)
BUN: 39 mg/dL — ABNORMAL HIGH (ref 8–23)
CO2: 23 mmol/L (ref 22–32)
Calcium: 7.9 mg/dL — ABNORMAL LOW (ref 8.9–10.3)
Chloride: 112 mmol/L — ABNORMAL HIGH (ref 98–111)
Creatinine, Ser: 1.65 mg/dL — ABNORMAL HIGH (ref 0.44–1.00)
GFR, Estimated: 31 mL/min — ABNORMAL LOW (ref >60)
Glucose, Bld: 206 mg/dL — ABNORMAL HIGH (ref 70–99)
Potassium: 5.7 mmol/L — ABNORMAL HIGH (ref 3.5–5.1)
Sodium: 141 mmol/L (ref 135–145)

## 2021-09-02 LAB — CBC
HCT: 37.2 % (ref 36.0–46.0)
Hemoglobin: 11.4 g/dL — ABNORMAL LOW (ref 12.0–15.0)
MCH: 25.6 pg — ABNORMAL LOW (ref 26.0–34.0)
MCHC: 30.6 g/dL (ref 30.0–36.0)
MCV: 83.6 fL (ref 80.0–100.0)
Platelets: 332 10*3/uL (ref 150–400)
RBC: 4.45 MIL/uL (ref 3.87–5.11)
RDW: 15.5 % (ref 11.5–15.5)
WBC: 9.6 10*3/uL (ref 4.0–10.5)
nRBC: 0 % (ref 0.0–0.2)

## 2021-09-02 LAB — TROPONIN I (HIGH SENSITIVITY): Troponin I (High Sensitivity): 8 ng/L (ref <18)

## 2021-09-02 NOTE — ED Triage Notes (Signed)
Pt has sob. Sx began while driving.  No chest pain.  No n/v.  Pt c/o feeling thirsty.    Pt alert. Speech clear.

## 2021-09-03 ENCOUNTER — Observation Stay
Admission: EM | Admit: 2021-09-03 | Discharge: 2021-09-04 | Disposition: A | Discharge status: Home / Self Care | Payer: Medicare Other | Attending: Family Medicine | Admitting: Family Medicine

## 2021-09-03 ENCOUNTER — Emergency Department: Payer: Medicare Other

## 2021-09-03 DIAGNOSIS — N179 Acute kidney failure, unspecified: Secondary | ICD-10-CM | POA: Diagnosis not present

## 2021-09-03 DIAGNOSIS — N1832 Acute kidney failure, unspecified: Secondary | ICD-10-CM | POA: Diagnosis present

## 2021-09-03 DIAGNOSIS — N3000 Acute cystitis without hematuria: Secondary | ICD-10-CM

## 2021-09-03 DIAGNOSIS — I5032 Chronic diastolic (congestive) heart failure: Secondary | ICD-10-CM | POA: Diagnosis present

## 2021-09-03 DIAGNOSIS — R109 Unspecified abdominal pain: Secondary | ICD-10-CM

## 2021-09-03 DIAGNOSIS — E669 Obesity, unspecified: Secondary | ICD-10-CM | POA: Diagnosis present

## 2021-09-03 DIAGNOSIS — I10 Essential (primary) hypertension: Secondary | ICD-10-CM | POA: Diagnosis present

## 2021-09-03 DIAGNOSIS — E875 Hyperkalemia: Secondary | ICD-10-CM | POA: Diagnosis not present

## 2021-09-03 DIAGNOSIS — E1129 Type 2 diabetes mellitus with other diabetic kidney complication: Secondary | ICD-10-CM | POA: Diagnosis present

## 2021-09-03 DIAGNOSIS — J449 Chronic obstructive pulmonary disease, unspecified: Secondary | ICD-10-CM | POA: Diagnosis present

## 2021-09-03 DIAGNOSIS — N390 Urinary tract infection, site not specified: Secondary | ICD-10-CM | POA: Diagnosis present

## 2021-09-03 LAB — COMPREHENSIVE METABOLIC PANEL
ALT: 16 U/L (ref 0–44)
AST: 14 U/L — ABNORMAL LOW (ref 15–41)
Albumin: 3 g/dL — ABNORMAL LOW (ref 3.5–5.0)
Alkaline Phosphatase: 80 U/L (ref 38–126)
Anion gap: 7 (ref 5–15)
BUN: 39 mg/dL — ABNORMAL HIGH (ref 8–23)
CO2: 24 mmol/L (ref 22–32)
Calcium: 7.8 mg/dL — ABNORMAL LOW (ref 8.9–10.3)
Chloride: 110 mmol/L (ref 98–111)
Creatinine, Ser: 1.6 mg/dL — ABNORMAL HIGH (ref 0.44–1.00)
GFR, Estimated: 32 mL/min — ABNORMAL LOW (ref >60)
Glucose, Bld: 127 mg/dL — ABNORMAL HIGH (ref 70–99)
Potassium: 5.3 mmol/L — ABNORMAL HIGH (ref 3.5–5.1)
Sodium: 141 mmol/L (ref 135–145)
Total Bilirubin: 0.5 mg/dL (ref 0.3–1.2)
Total Protein: 6.2 g/dL — ABNORMAL LOW (ref 6.5–8.1)

## 2021-09-03 LAB — URINALYSIS, ROUTINE W REFLEX MICROSCOPIC
Bilirubin Urine: NEGATIVE
Glucose, UA: 50 mg/dL — AB
Hgb urine dipstick: NEGATIVE
Ketones, ur: NEGATIVE mg/dL
Leukocytes,Ua: NEGATIVE
Nitrite: NEGATIVE
Protein, ur: >=300 mg/dL — AB
Specific Gravity, Urine: 1.013 (ref 1.005–1.030)
WBC, UA: >50 WBC/hpf — ABNORMAL HIGH (ref 0–5)
pH: 6 (ref 5.0–8.0)

## 2021-09-03 LAB — GLUCOSE, CAPILLARY
Glucose-Capillary: 111 mg/dL — ABNORMAL HIGH (ref 70–99)
Glucose-Capillary: 164 mg/dL — ABNORMAL HIGH (ref 70–99)

## 2021-09-03 LAB — BRAIN NATRIURETIC PEPTIDE: B Natriuretic Peptide: 63.7 pg/mL (ref 0.0–100.0)

## 2021-09-03 LAB — POTASSIUM: Potassium: 4.8 mmol/L (ref 3.5–5.1)

## 2021-09-03 LAB — TROPONIN I (HIGH SENSITIVITY): Troponin I (High Sensitivity): 8 ng/L (ref <18)

## 2021-09-03 MED ORDER — HYDRALAZINE HCL 20 MG/ML IJ SOLN: 5.0000 mg | INTRAMUSCULAR | Status: DC | PRN

## 2021-09-03 MED ORDER — TRAZODONE HCL 100 MG PO TABS: 100.0000 mg | ORAL_TABLET | Freq: Every evening | ORAL | Status: DC | PRN

## 2021-09-03 MED ORDER — FLUTICASONE FUROATE-VILANTEROL 100-25 MCG/ACT IN AEPB: 1.0000 | INHALATION_SPRAY | Freq: Every day | RESPIRATORY_TRACT | Status: DC

## 2021-09-03 MED ORDER — POLYETHYLENE GLYCOL 3350 17 G PO PACK
17.0000 g | PACK | Freq: Every day | ORAL | Status: DC | PRN
Start: 2021-09-03 — End: 2021-09-04

## 2021-09-03 MED ORDER — ONDANSETRON HCL 4 MG/2ML IJ SOLN: 4.0000 mg | Freq: Three times a day (TID) | INTRAMUSCULAR | Status: DC | PRN

## 2021-09-03 MED ORDER — INSULIN GLARGINE-YFGN 100 UNIT/ML ~~LOC~~ SOLN
7.0000 [IU] | Freq: Every day | SUBCUTANEOUS | Status: DC
  Administered 2021-09-03: 7 [IU] via SUBCUTANEOUS
  Filled 2021-09-03 (×2): qty 0.07

## 2021-09-03 MED ORDER — INSULIN ASPART 100 UNIT/ML IJ SOLN: 0.0000 [IU] | Freq: Three times a day (TID) | INTRAMUSCULAR | Status: DC

## 2021-09-03 MED ORDER — LOKELMA 10 G PO PACK: 10.0000 g | PACK | Freq: Three times a day (TID) | ORAL | 0 refills | Status: AC

## 2021-09-03 MED ORDER — ENOXAPARIN SODIUM 60 MG/0.6ML IJ SOSY: 0.5000 mg/kg | PREFILLED_SYRINGE | INTRAMUSCULAR | Status: DC

## 2021-09-03 MED ORDER — INSULIN ASPART 100 UNIT/ML IJ SOLN: 0.0000 [IU] | Freq: Every day | INTRAMUSCULAR | Status: DC

## 2021-09-03 MED ORDER — SODIUM ZIRCONIUM CYCLOSILICATE 10 G PO PACK
10.0000 g | PACK | Freq: Once | ORAL | Status: AC
  Administered 2021-09-03: 10 g via ORAL
  Filled 2021-09-03: qty 1

## 2021-09-03 MED ORDER — ACETAMINOPHEN 325 MG PO TABS: 650.0000 mg | ORAL_TABLET | Freq: Four times a day (QID) | ORAL | Status: DC | PRN

## 2021-09-03 MED ORDER — FOSFOMYCIN TROMETHAMINE 3 G PO PACK: 3.0000 g | PACK | Freq: Once | ORAL | Status: DC

## 2021-09-03 MED ORDER — UMECLIDINIUM BROMIDE 62.5 MCG/ACT IN AEPB: 1.0000 | INHALATION_SPRAY | Freq: Every day | RESPIRATORY_TRACT | Status: DC

## 2021-09-03 MED ORDER — ORAL CARE MOUTH RINSE: 15.0000 mL | OROMUCOSAL | Status: DC | PRN

## 2021-09-03 MED ORDER — LACTATED RINGERS IV BOLUS: 1000.0000 mL | Freq: Once | INTRAVENOUS | Status: DC

## 2021-09-03 MED ORDER — COLCHICINE 0.6 MG PO TABS: 0.6000 mg | ORAL_TABLET | Freq: Every day | ORAL | Status: DC | PRN

## 2021-09-03 MED ORDER — ALBUTEROL SULFATE (2.5 MG/3ML) 0.083% IN NEBU: 3.0000 mL | INHALATION_SOLUTION | Freq: Four times a day (QID) | RESPIRATORY_TRACT | Status: DC | PRN

## 2021-09-03 NOTE — Progress Notes (Signed)
Morton Amy, on-call for attending, notified of pt's med refusal (Lovenox) '@2134'$ . No orders received. Will continue to monitor.

## 2021-09-03 NOTE — ED Provider Notes (Addendum)
[facility] Provider Note    Event Date/Time   First MD Initiated Contact with Patient 09/03/21 0157     (approximate)   History   Shortness of Breath   HPI  Karla Sanchez is a 81 y.o. female who presents to the ED for evaluation of Shortness of Breath   I reviewed medical DC summary from yesterday, 6/13.  She was admitted for 6 days for acute cystitis.  She is a history of multidrug-resistant E. coli recurrent UTIs.  DM, HTN, obesity and CHF.  Based off of urine cultures, she was discharged with 3 doses of fosfomycin every 72 hours with the final/third dose on 6/16.  Finish fluconazole while hospitalized.  Discharged home.  Patient presents to the ED for evaluation of right-sided abdominal pain.  She was noted to be triage for evaluation of dyspnea, but she only mentions pain to me.  She reports she has had subacute right-sided abdominal pain and always had negative imaging, but reports it seemed to get worse when she was driving home today from the hospital so she presents to our ED for evaluation.  Physical Exam   Triage Vital Signs: ED Triage Vitals  Enc Vitals Group     BP 09/02/21 2140 (!) 154/71     Pulse Rate 09/02/21 2140 74     Resp 09/02/21 2140 18     Temp 09/02/21 2140 98.7 F (37.1 C)     Temp Source 09/02/21 2140 Oral     SpO2 09/02/21 2140 92 %     Weight 09/02/21 2141 220 lb (99.8 kg)     Height 09/02/21 2141 '5\' 6"'$  (1.676 m)     Head Circumference --      Peak Flow --      Pain Score 09/02/21 2141 8     Pain Loc --      Pain Edu? --      Excl. in [place]? --     Most recent vital signs: Vitals:   09/03/21 0223 09/03/21 0454  BP: (!) 159/58 113/81  Pulse: 73 67  Resp: 16   Temp:    SpO2: 96% 94%    General: Awake, no distress.  Laying flat when I first into the room, sits up by herself and is pleasant and conversational in full sentences. CV:  Good peripheral perfusion.  Resp:  Normal effort.  Clear  throughout Abd:  No distention.  Mild and poorly localizing tenderness throughout the right-sided abdomen and flank.  No overlying skin changes or signs of trauma.  No peritoneal features.  Abdomen otherwise benign. MSK:  No deformity noted.  Neuro:  No focal deficits appreciated. Other:     ED Results / Procedures / Treatments   Labs (all labs ordered are listed, but only abnormal results are displayed) Labs Reviewed  BASIC METABOLIC PANEL - Abnormal; Notable for the following components:      Result Value   Potassium 5.7 (*)    Chloride 112 (*)    Glucose, Bld 206 (*)    BUN 39 (*)    Creatinine, Ser 1.65 (*)    Calcium 7.9 (*)    GFR, Estimated 31 (*)    All other components within normal limits  CBC - Abnormal; Notable for the following components:   Hemoglobin 11.4 (*)    MCH 25.6 (*)    All other components within normal limits  COMPREHENSIVE METABOLIC PANEL - Abnormal; Notable for the following components:   Potassium  5.3 (*)    Glucose, Bld 127 (*)    BUN 39 (*)    Creatinine, Ser 1.60 (*)    Calcium 7.8 (*)    Total Protein 6.2 (*)    Albumin 3.0 (*)    AST 14 (*)    GFR, Estimated 32 (*)    All other components within normal limits  BRAIN NATRIURETIC PEPTIDE  URINALYSIS, ROUTINE W REFLEX MICROSCOPIC  TROPONIN I (HIGH SENSITIVITY)  TROPONIN I (HIGH SENSITIVITY)    EKG Sinus rhythm, rate of 73 bpm.  Leftward axis.  First-degree AV block and right bundle branch block.  No STEMI. Similar to previous EKG from February.  RADIOLOGY CXR interpreted by me without evidence of acute cardiopulmonary pathology. CT abdomen/pelvis interpreted by me without evidence of acute intra-abdominal pathology.  Official radiology report(s): CT ABDOMEN PELVIS WO CONTRAST  Result Date: 09/03/2021 CLINICAL DATA:  Right right side abdominal pain EXAM: CT ABDOMEN AND PELVIS WITHOUT CONTRAST TECHNIQUE: Multidetector CT imaging of the abdomen and pelvis was performed following the  standard protocol without IV contrast. RADIATION DOSE REDUCTION: This exam was performed according to the departmental dose-optimization program which includes automated exposure control, adjustment of the mA and/or kV according to patient size and/or use of iterative reconstruction technique. COMPARISON:  None Available. FINDINGS: Lower chest: No acute abnormality Hepatobiliary: Small gallstones in the gallbladder. No focal hepatic abnormality or biliary ductal dilatation. Pancreas: No focal abnormality or ductal dilatation. Spleen: Normal size. 7 cm cyst in the spleen is stable since prior study. Adrenals/Urinary Tract: Bilateral renal low-density lesions, likely small cysts. Scattered small high-density lesions also within the kidneys, favor hemorrhagic/proteinaceous cysts. No stones or hydronephrosis. Adrenal glands and urinary bladder unremarkable. Stomach/Bowel: Stomach is decompressed, unremarkable. Stable duodenal diverticulum without inflammatory change. Small bowel is decompressed. Moderate stool throughout the colon. Vascular/Lymphatic: Aortic atherosclerosis. No evidence of aneurysm or adenopathy. Reproductive: Stable left adnexal cyst measuring 3.4 cm. Uterus and right adnexa unremarkable. Other: No free fluid or free air. Musculoskeletal: No acute bony abnormality. Mild L1 compression fracture is stable. IMPRESSION: No acute findings in the abdomen or pelvis. Bilateral renal cysts.  Stable splenic cyst. Aortic atherosclerosis. Electronically Signed   By: Rolm Baptise M.D.   On: 09/03/2021 03:13   DG Chest 1 View  Result Date: 09/02/2021 CLINICAL DATA:  Shortness of breath EXAM: CHEST  1 VIEW COMPARISON:  05/01/2021 FINDINGS: The heart size and mediastinal contours are within normal limits. Aortic atherosclerosis. Both lungs are clear. The visualized skeletal structures are unremarkable. IMPRESSION: No active disease. Electronically Signed   By: Donavan Foil M.D.   On: 09/02/2021 22:23     PROCEDURES and INTERVENTIONS:  .1-3 Lead EKG Interpretation  Performed by: Vladimir Crofts, MD Authorized by: Vladimir Crofts, MD     Interpretation: normal     ECG rate:  70   ECG rate assessment: normal     Rhythm: sinus rhythm     Ectopy: none     Conduction: normal     Medications  lactated ringers bolus 1,000 mL (has no administration in time range)  ondansetron (ZOFRAN) injection 4 mg (has no administration in time range)  sodium zirconium cyclosilicate (LOKELMA) packet 10 g (10 g Oral Given 09/03/21 0430)     IMPRESSION / MDM / ASSESSMENT AND PLAN / ED COURSE  I reviewed the triage vital signs and the nursing notes.  Differential diagnosis includes, but is not limited to, CHF exacerbation, constipation, AKI, hyperkalemia, ureteral stone, diverticulitis  {Patient presents  with symptoms of an acute illness or injury that is potentially life-threatening.  Patient presents to the ED a few hours after being discharged from another facility with complaints of abdominal pain, with evidence of mild hyperkalemia suitable for outpatient management.  Of note, she was triaged with complaints of dyspnea, but denies this with me and tells me of right-sided abdominal pain.  She has benign abdominal examination without peritoneal features.  Looks systemically well.  No signs of volume overload or respiratory pathology.  Clear lungs.  Blood work shows hyperkalemia and CKD around baseline.  No leukocytosis or signs of sepsis, such as from an untreated urologic infection.  Her CXR is clear, BNP is low and I doubt significant CHF exacerbation.  2 troponins are negative and I doubt ACS.  Repeat of her metabolic panel without intervention demonstrates improvement of her mild hyperkalemia.  We will provide her with [facility] over the next couple days and have her follow-up closely with her PCP for recheck.  We discussed return precautions.  I considered observation admission for this patient, but ultimately  we settled upon outpatient management and close follow-up.  As below, we tried to discharge the patient but ultimately she has an unsafe discharge plan.  I consult with medicine who agrees to admit for worsening renal dysfunction and her presenting complaints.  Clinical Course as of 09/03/21 0705  Wed Sep 03, 2021  8381 Repeat metabolic panel with improving potassium without intervention yet.  We will provide Lokelma.  CT scan, BNP and CXR reassuring. [DS]  0424 Reassessed.  Feeling better.  We discussed plan of care.  Shared decision-making yields plan to pursue outpatient management with [facility] and follow-up closely with her PCP for recheck. [DS]  0600 Nursing staff try to get the patient out to her [name] to get her home.  The [name] was apparently quite filthy, covered in urine, feces and trash.  Patient was unable to even get in the [place] and it was apparently incompatible with use. She is brought back to her room and I called her nephew who indicates that he has not seen the patient in quite a long time, months-years and is not available to help her out.  [DS]  0630 I consulted medicine who agrees to admit. [DS]    Clinical Course User Index [DS] Vladimir Crofts, MD     FINAL CLINICAL IMPRESSION(S) / ED DIAGNOSES   Final diagnoses:  Hyperkalemia  Abdominal pain, unspecified abdominal location     Rx / DC Orders   ED Discharge Orders          Ordered    sodium zirconium cyclosilicate (LOKELMA) 10 g PACK packet  3 times daily        09/03/21 0425             Note:  This document was prepared using Dragon voice recognition software and may include unintentional dictation errors.   Vladimir Crofts, MD 09/03/21 [place], [place], MD 09/03/21 [phone]

## 2021-09-03 NOTE — H&P (Signed)
History and Physical    Leafy Motsinger RXV:[id number] DOB: January 24, 1941 DOA: 09/03/2021  Referring MD/NP/PA:   PCP: Alfredia Client, MD   Patient coming from:  The patient is coming from home.    Chief Complaint: Abdominal pain  HPI: Karla Sanchez is a 81 y.o. female with medical history significant of hypertension, diabetes mellitus, COPD, gout, colon cancer (s/p of colectomy), CHF, obesity with a BMI 35.51, CKD-3B, recent admission due to MDR E. coli induced UTI, who presents with abdominal pain.  Patient was recently hospitalized from 6/7 - 6/13 due to MDR E. coli induced UTI. Pt was treated with IV meropenem in the hospital and discharged on q72h  of fosfomycin, last dose will be on 6/16. Urine culture on 6/8 was positive for o MDR E. Coli. Patient had also finished a course of Diflucan for vaginal Candida infection.  Today, patient denies dysuria, burning on urination or increased urinary frequency.  She complains of abdominal pain, which is located in the right side of abdomen and epigastric area.  Initially mild to moderate, sharp, nonradiating.  No nausea vomiting diarrhea.  No fever or chills.  She states that her abdominal has resolved when I saw patient in ED.  Patient does not have chest pain, cough, shortness breath.  Data Reviewed and ED Course: pt was found to have WBC 9.6, troponin level 8, 8, BNP 63.7, potassium 5.7--> 5.3 --> 4.8, worsening renal function, temperature normal, blood pressure 113/81, heart rate 74, RR 18, oxygen saturation 96% on room air.  Chest x-ray negative.  CT abdomen/pelvis is negative for acute intra-abdominal issues.  Patient is placed on MedSurg bed for observation.   EKG: I have personally reviewed.  Sinus rhythm, QTc 491, bifascicular block, poor R wave progression   Review of Systems:   General: no fevers, chills, no body weight gain, has fatigue HEENT: no blurry vision, hearing changes or sore throat Respiratory: no  dyspnea, coughing, wheezing CV: no chest pain, no palpitations GI: no nausea, vomiting, has abdominal pain, no diarrhea, constipation GU: no dysuria, burning on urination, increased urinary frequency, hematuria  Ext: has trace leg edema Neuro: no unilateral weakness, numbness, or tingling, no vision change or hearing loss Skin: no rash, no skin tear. MSK: No muscle spasm, no deformity, no limitation of range of movement in spin Heme: No easy bruising.  Travel history: No recent long distant travel.   Allergy:  Allergies  Allergen Reactions   Codeine Nausea Only, Other (See Comments) and Swelling    Other Reaction: nausea and vomiting Other reaction(s): NAUSEA    Other Other (See Comments)    Other reaction(s): SWELLING  Other reaction(s): MUSCLE PAIN Muscle aches Other reaction(s): MUSCLE PAIN Muscle aches Other reaction(s): MUSCLE PAIN Muscle aches    Exenatide Nausea And Vomiting   Levofloxacin Other (See Comments)    Other Reaction: sloughing of buccal mucosa Other reaction(s): OTHER Lost skin in mouth Patient states she can take Cipro without difficulty and has taken it many times in the past Other Reaction: sloughing of buccal mucosa    Losartan Other (See Comments)    Weight gain Weight gain    Sulfa Antibiotics    Tetracyclines & Related Other (See Comments)   Statins Other (See Comments)    Past Medical History:  Diagnosis Date   CHF (congestive heart failure) (HCC)    Colon cancer (HCC)    s/p right and left colectomy 2020   COPD (chronic obstructive pulmonary disease) ([place])  Diabetes mellitus without complication ([place])    Diastolic heart failure ([place])    Gout    Hypertension     Past Surgical History:  Procedure Laterality Date   COLON SURGERY     TUBAL LIGATION  1970    Social History:  reports that she has never smoked. She has never used smokeless tobacco. She reports that she does not currently use alcohol. She reports that she does not  use drugs.  Family History:  Family History  Problem Relation Age of Onset   Lung cancer Father      Prior to Admission medications   Medication Sig Start Date End Date Taking? Authorizing Provider  sodium zirconium cyclosilicate (LOKELMA) 10 g PACK packet Take 10 g by mouth 3 (three) times daily for 1 day. 09/03/21 09/04/21 Yes Vladimir Crofts, MD  acetaminophen (TYLENOL) 325 MG tablet Take 650 mg by mouth every 6 (six) hours as needed for mild pain or fever.     [provider]  albuterol (VENTOLIN HFA) 108 (90 Base) MCG/ACT inhaler Inhale 2 puffs into the lungs every 6 (six) hours as needed for wheezing or shortness of breath. 01/09/20   Loletha Grayer, MD  amLODipine (NORVASC) 10 MG tablet Take 10 mg by mouth daily. 02/18/21   [provider]  aspirin EC 81 MG tablet Take 1 tablet (81 mg total) by mouth daily. Swallow whole. 01/09/20   Loletha Grayer, MD  cetirizine (ZYRTEC) 10 MG tablet Take 10 mg by mouth daily. 03/04/21   [provider]  colchicine 0.6 MG tablet Take 1 tablet (0.6 mg total) by mouth daily. 05/13/21   Jennye Boroughs, MD  furosemide (LASIX) 40 MG tablet Take 1 tablet (40 mg total) by mouth daily. Take '80mg'$  po qam and '40mg'$  po qpm 05/09/21   Jennye Boroughs, MD  metoprolol tartrate (LOPRESSOR) 25 MG tablet Take 0.5 tablets by mouth in the morning and at bedtime. 04/22/21 04/22/22  [provider]  MIRALAX 17 GM/SCOOP powder Take by mouth. 03/04/21   [provider]  traZODone (DESYREL) 100 MG tablet Take 1 tablet by mouth at bedtime as needed. 02/28/21 05/29/21  [provider]  TRELEGY ELLIPTA 100-62.5-25 MCG/ACT AEPB Inhale 1 puff into the lungs daily. 03/28/21   [provider]    Physical Exam: Vitals:   09/03/21 0454 09/03/21 0825 09/03/21 1026 09/03/21 1551  BP: 113/81 110/78 (!) 181/54 (!) 156/61  Pulse: 67 70 73 (!) 55  Resp:  '16 18 16  '$ Temp:  98 F (36.7 C) (!) 97.4 F (36.3 C) 98.1 F (36.7 C)   TempSrc:  Oral Oral Oral  SpO2: 94% 95% 94% 96%  Weight:      Height:       General: Not in acute distress HEENT:       Eyes: PERRL, EOMI, no scleral icterus.       ENT: No discharge from the ears and nose, no pharynx injection, no tonsillar enlargement.        Neck: No JVD, no bruit, no mass felt. Heme: No neck lymph node enlargement. Cardiac: S1/S2, RRR, No murmurs, No gallops or rubs. Respiratory: No rales, wheezing, rhonchi or rubs. GI: Soft, nondistended, nontender, no rebound pain, no organomegaly, BS present. GU: No hematuria Ext: has trace leg edema bilaterally. 1+DP/PT pulse bilaterally. Musculoskeletal: No joint deformities, No joint redness or warmth, no limitation of ROM in spin. Skin: No rashes.  Neuro: Alert, oriented X3, cranial nerves II-XII grossly intact, moves all extremities  normally.  Psych: Patient is not psychotic, no suicidal or hemocidal ideation.  Labs on Admission: I have personally reviewed following labs and imaging studies  CBC: Recent Labs  Lab 09/02/21 2143  WBC 9.6  HGB 11.4*  HCT 37.2  MCV 83.6  PLT 673   Basic Metabolic Panel: Recent Labs  Lab 09/02/21 2143 09/03/21 0216 09/03/21 1117  NA 141 141  --   K 5.7* 5.3* 4.8  CL 112* 110  --   CO2 23 24  --   GLUCOSE 206* 127*  --   BUN 39* 39*  --   CREATININE 1.65* 1.60*  --   CALCIUM 7.9* 7.8*  --    GFR: Estimated Creatinine Clearance: 32.9 mL/min (A) (by C-G formula based on SCr of 1.6 mg/dL (H)). Liver Function Tests: Recent Labs  Lab 09/03/21 0216  AST 14*  ALT 16  ALKPHOS 80  BILITOT 0.5  PROT 6.2*  ALBUMIN 3.0*   No results for input(s): "LIPASE", "AMYLASE" in the last 168 hours. No results for input(s): "AMMONIA" in the last 168 hours. Coagulation Profile: No results for input(s): "INR", "PROTIME" in the last 168 hours. Cardiac Enzymes: No results for input(s): "CKTOTAL", "CKMB", "CKMBINDEX", "TROPONINI" in the last 168 hours. BNP (last 3 results) No results  for input(s): "PROBNP" in the last 8760 hours. HbA1C: No results for input(s): "HGBA1C" in the last 72 hours. CBG: No results for input(s): "GLUCAP" in the last 168 hours. Lipid Profile: No results for input(s): "CHOL", "HDL", "LDLCALC", "TRIG", "CHOLHDL", "LDLDIRECT" in the last 72 hours. Thyroid Function Tests: No results for input(s): "TSH", "T4TOTAL", "FREET4", "T3FREE", "THYROIDAB" in the last 72 hours. Anemia Panel: No results for input(s): "VITAMINB12", "FOLATE", "FERRITIN", "TIBC", "IRON", "RETICCTPCT" in the last 72 hours. Urine analysis:    Component Value Date/Time   COLORURINE YELLOW (A) 05/01/2021 1823   APPEARANCEUR HAZY (A) 05/01/2021 1823   LABSPEC 1.018 05/01/2021 1823   PHURINE 5.0 05/01/2021 1823   GLUCOSEU 150 (A) 05/01/2021 1823   HGBUR SMALL (A) 05/01/2021 1823   BILIRUBINUR NEGATIVE 05/01/2021 1823   KETONESUR NEGATIVE 05/01/2021 1823   PROTEINUR >=300 (A) 05/01/2021 1823   NITRITE NEGATIVE 05/01/2021 1823   LEUKOCYTESUR TRACE (A) 05/01/2021 1823   Sepsis Labs: '@LABRCNTIP'$ (procalcitonin:4,lacticidven:4) )No results found for this or any previous visit (from the past 240 hour(s)).   Radiological Exams on Admission: CT ABDOMEN PELVIS WO CONTRAST  Result Date: 09/03/2021 CLINICAL DATA:  Right right side abdominal pain EXAM: CT ABDOMEN AND PELVIS WITHOUT CONTRAST TECHNIQUE: Multidetector CT imaging of the abdomen and pelvis was performed following the standard protocol without IV contrast. RADIATION DOSE REDUCTION: This exam was performed according to the departmental dose-optimization program which includes automated exposure control, adjustment of the mA and/or kV according to patient size and/or use of iterative reconstruction technique. COMPARISON:  None Available. FINDINGS: Lower chest: No acute abnormality Hepatobiliary: Small gallstones in the gallbladder. No focal hepatic abnormality or biliary ductal dilatation. Pancreas: No focal abnormality or ductal  dilatation. Spleen: Normal size. 7 cm cyst in the spleen is stable since prior study. Adrenals/Urinary Tract: Bilateral renal low-density lesions, likely small cysts. Scattered small high-density lesions also within the kidneys, favor hemorrhagic/proteinaceous cysts. No stones or hydronephrosis. Adrenal glands and urinary bladder unremarkable. Stomach/Bowel: Stomach is decompressed, unremarkable. Stable duodenal diverticulum without inflammatory change. Small bowel is decompressed. Moderate stool throughout the colon. Vascular/Lymphatic: Aortic atherosclerosis. No evidence of aneurysm or adenopathy. Reproductive: Stable left adnexal cyst measuring 3.4 cm. Uterus and right adnexa  unremarkable. Other: No free fluid or free air. Musculoskeletal: No acute bony abnormality. Mild L1 compression fracture is stable. IMPRESSION: No acute findings in the abdomen or pelvis. Bilateral renal cysts.  Stable splenic cyst. Aortic atherosclerosis. Electronically Signed   By: Rolm Baptise M.D.   On: 09/03/2021 03:13   DG Chest 1 View  Result Date: 09/02/2021 CLINICAL DATA:  Shortness of breath EXAM: CHEST  1 VIEW COMPARISON:  05/01/2021 FINDINGS: The heart size and mediastinal contours are within normal limits. Aortic atherosclerosis. Both lungs are clear. The visualized skeletal structures are unremarkable. IMPRESSION: No active disease. Electronically Signed   By: Donavan Foil M.D.   On: 09/02/2021 22:23      Assessment/Plan Principal Problem:   Hyperkalemia Active Problems:   Acute renal failure superimposed on stage 3b chronic kidney disease (HCC)   UTI (urinary tract infection)   Chronic diastolic CHF (congestive heart failure) (HCC)   COPD (chronic obstructive pulmonary disease) (HCC)   Type II diabetes mellitus with renal manifestations (HCC)   HTN (hypertension)   Obesity (BMI 35.0-39.9 without comorbidity)   Principal Problem:   Hyperkalemia Active Problems:   Acute renal failure superimposed on  stage 3b chronic kidney disease (HCC)   UTI (urinary tract infection)   Chronic diastolic CHF (congestive heart failure) (HCC)   COPD (chronic obstructive pulmonary disease) (HCC)   Type II diabetes mellitus with renal manifestations (HCC)   HTN (hypertension)   Obesity (BMI 35.0-39.9 without comorbidity)   Assessment and Plan: * Hyperkalemia Potassium 5.7, 5.3.  Patient was treated with 10 g of leukoma.  Potassium decreased to 4.8.  -Placed on MedSurg bed for position -IV fluid: 1 L LR -Leukoma 10 g was given  Acute renal failure superimposed on stage 3b chronic kidney disease ([place]) Baseline creatinine 0.93 on 05/05/2021.  Her creatinine was up to 1.65 on 09/02/2021 which was likely due to UTI.  Today patient's creatinine is 1.60, BUN 39, GFR 32, worsening than baseline, stable from recent creatinine level.  -Hold diuretics and Lasix -IV fluid: Received 1 L LR  UTI (urinary tract infection) Patient does not have symptoms of  UTI anymore. -Will let patient to complete last dose of fosfomycin on 6/16 -Repeat urinalysis today  Chronic diastolic CHF (congestive heart failure) ([place]) 2D echo on 06/10/2020 showed EF of 55-60%.  Patient has trace leg edema, no pulm edema by chest x-ray, BNP normal 63.7.  CHF seem to be compensated. -Hold Lasix due to worsening renal function  Obesity (BMI 35.0-39.9 without comorbidity)  BMI= 35.51   and BW= 99.8 -Diet and exercise.   -Encouraged to lose weight.   HTN (hypertension) Patient's not taking home amlodipine, metoprolol, currently.  Blood pressure normal, 113/81 - IV hydralazine as needed --Hold Lasix as above  Type II diabetes mellitus with renal manifestations (HCC) Recent A1c 7.4, poorly controlled.  Patient taking Levemir 15 units daily -Sliding scale insulin -Glargine insulin 7 units daily  COPD (chronic obstructive pulmonary disease) (HCC) Stable - Bronchodilators             DVT ppx: SQ Lovenox  Code Status:  Full code per pt  Family Communication: not done, no family member is at bed side.  I offered to call her family, but the patient said I do not need to call her family.  Disposition Plan:  Anticipate discharge back to previous environment  Consults called:  none  Admission status and Level of care: Telemetry Medical:   for obs  Severity of Illness:  The appropriate patient status for this patient is OBSERVATION. Observation status is judged to be reasonable and necessary in order to provide the required intensity of service to ensure the patient's safety. The patient's presenting symptoms, physical exam findings, and initial radiographic and laboratory data in the context of their medical condition is felt to place them at decreased risk for further clinical deterioration. Furthermore, it is anticipated that the patient will be medically stable for discharge from the hospital within 2 midnights of admission.        Date of Service 09/03/2021    Ivor Costa [facility]   If 7PM-7AM, please contact night-coverage [email or web address] 09/03/2021, 5:30 PM

## 2021-09-03 NOTE — Assessment & Plan Note (Addendum)
Recent A1c 7.4, poorly controlled.  Patient taking Levemir 15 units daily -Sliding scale insulin -Glargine insulin 7 units daily

## 2021-09-03 NOTE — ED Notes (Signed)
Pt refused IV  

## 2021-09-03 NOTE — Assessment & Plan Note (Signed)
Baseline creatinine 0.93 on 05/05/2021.  Her creatinine was up to 1.65 on 09/02/2021 which was likely due to UTI.  Today patient's creatinine is 1.60, BUN 39, GFR 32, worsening than baseline, stable from recent creatinine level.  -Hold diuretics and Lasix -IV fluid: Received 1 L LR

## 2021-09-03 NOTE — Assessment & Plan Note (Signed)
Patient's not taking home amlodipine, metoprolol, currently.  Blood pressure normal, 113/81 - IV hydralazine as needed --Hold Lasix as above

## 2021-09-03 NOTE — Assessment & Plan Note (Signed)
  BMI= 35.51   and BW= 99.8 -Diet and exercise.   -Encouraged to lose weight.

## 2021-09-03 NOTE — Assessment & Plan Note (Signed)
Potassium 5.7, 5.3.  Patient was treated with 10 g of leukoma.  Potassium decreased to 4.8.  -Placed on MedSurg bed for position -IV fluid: 1 L LR -Leukoma 10 g was given

## 2021-09-03 NOTE — Discharge Instructions (Signed)
Please pick up the [facility] packets at the pharmacy.  Take 3 times throughout the day and follow-up with your PCP for potassium recheck.  Continue all of your other medications

## 2021-09-03 NOTE — Progress Notes (Signed)
Admission profile updated. ?

## 2021-09-03 NOTE — ED Notes (Signed)
Pt states she will not stay in the hospital, requested a phone to call a garage to start her car and she is leaving.
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
## 2021-09-03 NOTE — Assessment & Plan Note (Signed)
Patient does not have symptoms of  UTI anymore. -Will let patient to complete last dose of fosfomycin on 6/16 -Repeat urinalysis today

## 2021-09-03 NOTE — Assessment & Plan Note (Signed)
2D echo on 06/10/2020 showed EF of 55-60%.  Patient has trace leg edema, no pulm edema by chest x-ray, BNP normal 63.7.  CHF seem to be compensated. -Hold Lasix due to worsening renal function

## 2021-09-03 NOTE — ED Notes (Signed)
Informed RN bed assigned 

## 2021-09-03 NOTE — Assessment & Plan Note (Signed)
Stable -Bronchodilators 

## 2021-09-04 DIAGNOSIS — I5032 Chronic diastolic (congestive) heart failure: Secondary | ICD-10-CM | POA: Diagnosis not present

## 2021-09-04 DIAGNOSIS — E669 Obesity, unspecified: Secondary | ICD-10-CM

## 2021-09-04 DIAGNOSIS — J449 Chronic obstructive pulmonary disease, unspecified: Secondary | ICD-10-CM | POA: Diagnosis not present

## 2021-09-04 DIAGNOSIS — E875 Hyperkalemia: Secondary | ICD-10-CM | POA: Diagnosis not present

## 2021-09-04 DIAGNOSIS — N179 Acute kidney failure, unspecified: Secondary | ICD-10-CM | POA: Diagnosis not present

## 2021-09-04 LAB — BASIC METABOLIC PANEL WITH GFR
Anion gap: 4 — ABNORMAL LOW (ref 5–15)
BUN: 33 mg/dL — ABNORMAL HIGH (ref 8–23)
CO2: 24 mmol/L (ref 22–32)
Calcium: 8.5 mg/dL — ABNORMAL LOW (ref 8.9–10.3)
Chloride: 115 mmol/L — ABNORMAL HIGH (ref 98–111)
Creatinine, Ser: 1.3 mg/dL — ABNORMAL HIGH (ref 0.44–1.00)
GFR, Estimated: 41 mL/min — ABNORMAL LOW
Glucose, Bld: 114 mg/dL — ABNORMAL HIGH (ref 70–99)
Potassium: 5.3 mmol/L — ABNORMAL HIGH (ref 3.5–5.1)
Sodium: 143 mmol/L (ref 135–145)

## 2021-09-04 LAB — GLUCOSE, CAPILLARY
Glucose-Capillary: 114 mg/dL — ABNORMAL HIGH (ref 70–99)
Glucose-Capillary: 123 mg/dL — ABNORMAL HIGH (ref 70–99)

## 2021-09-04 MED ORDER — SODIUM ZIRCONIUM CYCLOSILICATE 10 G PO PACK
10.0000 g | PACK | Freq: Every day | ORAL | Status: DC
  Administered 2021-09-04: 10 g via ORAL
  Filled 2021-09-04: qty 1

## 2021-09-04 MED ORDER — FOSFOMYCIN TROMETHAMINE 3 G PO PACK: 3.0000 g | PACK | Freq: Once | ORAL | 0 refills | Status: AC

## 2021-09-04 NOTE — Progress Notes (Addendum)
Pt presently refusing vitals and blood draw stating she "doesn't want to be bothered until 8:05." Will continue to monitor.

## 2021-09-04 NOTE — Discharge Summary (Signed)
Physician Discharge Summary   Patient: Karla Sanchez MRN: [id number] DOB: 07/01/1940  Admit date:     09/03/2021  Discharge date: 09/04/21  Discharge Physician: Patrecia Pour   PCP: Alfredia Client, MD   Recommendations at discharge:  Follow up with PCP's office as scheduled with repeat BMP. Discharged after improvement in renal function and hyperkalemia to continue lokelma and home lasix.  Discharge Diagnoses: Principal Problem:   Hyperkalemia Active Problems:   Acute renal failure superimposed on stage 3b chronic kidney disease (HCC)   UTI (urinary tract infection)   Chronic diastolic CHF (congestive heart failure) (HCC)   COPD (chronic obstructive pulmonary disease) (HCC)   Type II diabetes mellitus with renal manifestations (HCC)   HTN (hypertension)   Obesity (BMI 35.0-39.9 without comorbidity)  Hospital Course: Karla Sanchez is a 81 y.o. female with medical history significant of hypertension, diabetes mellitus, COPD, gout, colon cancer (s/p of colectomy), CHF, obesity with a BMI 35.51, CKD-3B, recent admission due to MDR E. coli induced UTI, who presents with abdominal pain.   Patient was recently hospitalized from 6/7 - 6/13 due to MDR E. coli induced UTI. Pt was treated with IV meropenem in the hospital and discharged on q72h  of fosfomycin, last dose will be on 6/16. Urine culture on 6/8 was positive for o MDR E. Coli. Patient had also finished a course of Diflucan for vaginal Candida infection.  Today, patient denies dysuria, burning on urination or increased urinary frequency.  She complains of abdominal pain, which is located in the right side of abdomen and epigastric area.  Initially mild to moderate, sharp, nonradiating.  No nausea vomiting diarrhea.  No fever or chills.  She states that her abdominal has resolved when I saw patient in ED.  Patient does not have chest pain, cough, shortness breath.   Data Reviewed and ED Course: pt was found to  have WBC 9.6, troponin level 8, 8, BNP 63.7, potassium 5.7--> 5.3 --> 4.8, worsening renal function, temperature normal, blood pressure 113/81, heart rate 74, RR 18, oxygen saturation 96% on room air.  Chest x-ray negative.  CT abdomen/pelvis is negative for acute intra-abdominal issues.  Patient is placed on MedSurg bed for observation.  With lokelma, potassium improved. IV fluids have similarly returned creatinine to/lower than her baseline  Assessment and Plan: Hyperkalemia: Related to decreased renal clearance and diet heavy in fruits/juices. This appears to be a chronic problem based on old records. Improved with lokelma which we will continue.  Acute renal failure superimposed on stage 3b chronic kidney disease: Improved with holding diuretic and giving 1L IVF.   UTI (urinary tract infection) Patient does not have symptoms of  UTI anymore. -Will let patient to complete last dose of fosfomycin on 7/84  Chronic diastolic CHF (congestive heart failure) ([place]) 2D echo on 06/10/2020 showed EF of 55-60%.  Patient has trace leg edema, no pulm edema by chest x-ray, BNP normal 63.7.  CHF seem to be compensated. - Ok to continue lasix now that her po intake is normalized. Appears euvolemic on day of discharge.  Obesity: Body mass index is 35.51 kg/m.   HTN (hypertension) - No changes to home regimen at discharge.  Type II diabetes mellitus with renal manifestations (HCC) Recent A1c 7.4%, continue home medications pending PCP follow up  COPD (chronic obstructive pulmonary disease) ([place]) Stable - Bronchodilators  Consultants: Social work Procedures performed: None  Disposition: Home Diet recommendation:  Discharge Diet Orders (From admission, onward)  Start     Ordered   09/04/21 0000  Diet - low sodium heart healthy        09/04/21 1236           Cardiac diet DISCHARGE MEDICATION: Allergies as of 09/04/2021       Reactions   Codeine Nausea Only, Other (See Comments),  Swelling   Other Reaction: nausea and vomiting Other reaction(s): NAUSEA   Other Other (See Comments)   Other reaction(s): SWELLING Other reaction(s): MUSCLE PAIN Muscle aches Other reaction(s): MUSCLE PAIN Muscle aches Other reaction(s): MUSCLE PAIN Muscle aches   Exenatide Nausea And Vomiting   Levofloxacin Other (See Comments)   Other Reaction: sloughing of buccal mucosa Other reaction(s): OTHER Lost skin in mouth Patient states she can take Cipro without difficulty and has taken it many times in the past Other Reaction: sloughing of buccal mucosa   Losartan Other (See Comments)   Weight gain Weight gain   Sulfa Antibiotics    Tetracyclines & Related Other (See Comments)   Statins Other (See Comments)        Medication List     TAKE these medications    acetaminophen 325 MG tablet Commonly known as: TYLENOL Take 650 mg by mouth every 6 (six) hours as needed for mild pain or fever.   albuterol 108 (90 Base) MCG/ACT inhaler Commonly known as: VENTOLIN HFA Inhale 2 puffs into the lungs every 6 (six) hours as needed for wheezing or shortness of breath.   colchicine 0.6 MG tablet Take 1 tablet (0.6 mg total) by mouth daily.   fosfomycin 3 g Pack Commonly known as: MONUROL Take 3 g by mouth once for 1 dose. Start taking on: September 05, 2021   furosemide 40 MG tablet Commonly known as: LASIX Take 1 tablet (40 mg total) by mouth daily. Take '80mg'$  po qam and '40mg'$  po qpm   Levemir FlexTouch 100 UNIT/ML FlexPen Generic drug: insulin detemir Inject 15 Units into the skin at bedtime.   Lokelma 10 g Pack packet Generic drug: sodium zirconium cyclosilicate Take 10 g by mouth 3 (three) times daily for 1 day.   MiraLax 17 GM/SCOOP powder Generic drug: polyethylene glycol powder Take by mouth.   traZODone 100 MG tablet Commonly known as: DESYREL Take 1 tablet by mouth at bedtime as needed.   Trelegy Ellipta 100-62.5-25 MCG/ACT Aepb Generic drug:  Fluticasone-Umeclidin-Vilant Inhale 1 puff into the lungs daily.        Follow-up Information     Alfredia Client, MD. Schedule an appointment as soon as possible for a visit on 09/17/2021.   Specialty: Family Medicine Why: appointment @ 2:20 PM w/ [place] information: [address] [place] [address] [phone]         [facility] .   Specialty: Emergency Medicine Why: As needed, If symptoms worsen Contact information: [place] [id number] [address] [place] [phone]               Discharge Exam: Filed Weights   09/02/21 2141  Weight: 99.8 kg  BP (!) 155/87 (BP Location: Left Arm)   Pulse (!) 56   Temp 98.3 F (36.8 C)   Resp 18   Ht '5\' 6"'$  (1.676 m)   Wt 99.8 kg   SpO2 94%   BMI 35.51 kg/m   Pleasant, elderly female in no distress sitting in recliner. Clear, nonlabored RRR with trace dependent edema sitting in  chair, no JVD.  No focal deficits noted.  Condition at discharge: stable  The results of significant diagnostics from this hospitalization (including imaging, microbiology, ancillary and laboratory) are listed below for reference.   Imaging Studies: CT ABDOMEN PELVIS WO CONTRAST  Result Date: 09/03/2021 CLINICAL DATA:  Right right side abdominal pain EXAM: CT ABDOMEN AND PELVIS WITHOUT CONTRAST TECHNIQUE: Multidetector CT imaging of the abdomen and pelvis was performed following the standard protocol without IV contrast. RADIATION DOSE REDUCTION: This exam was performed according to the departmental dose-optimization program which includes automated exposure control, adjustment of the mA and/or kV according to patient size and/or use of iterative reconstruction technique. COMPARISON:  None Available. FINDINGS: Lower chest: No acute abnormality Hepatobiliary: Small gallstones in the gallbladder. No focal hepatic abnormality  or biliary ductal dilatation. Pancreas: No focal abnormality or ductal dilatation. Spleen: Normal size. 7 cm cyst in the spleen is stable since prior study. Adrenals/Urinary Tract: Bilateral renal low-density lesions, likely small cysts. Scattered small high-density lesions also within the kidneys, favor hemorrhagic/proteinaceous cysts. No stones or hydronephrosis. Adrenal glands and urinary bladder unremarkable. Stomach/Bowel: Stomach is decompressed, unremarkable. Stable duodenal diverticulum without inflammatory change. Small bowel is decompressed. Moderate stool throughout the colon. Vascular/Lymphatic: Aortic atherosclerosis. No evidence of aneurysm or adenopathy. Reproductive: Stable left adnexal cyst measuring 3.4 cm. Uterus and right adnexa unremarkable. Other: No free fluid or free air. Musculoskeletal: No acute bony abnormality. Mild L1 compression fracture is stable. IMPRESSION: No acute findings in the abdomen or pelvis. Bilateral renal cysts.  Stable splenic cyst. Aortic atherosclerosis. Electronically Signed   By: Rolm Baptise M.D.   On: 09/03/2021 03:13   DG Chest 1 View  Result Date: 09/02/2021 CLINICAL DATA:  Shortness of breath EXAM: CHEST  1 VIEW COMPARISON:  05/01/2021 FINDINGS: The heart size and mediastinal contours are within normal limits. Aortic atherosclerosis. Both lungs are clear. The visualized skeletal structures are unremarkable. IMPRESSION: No active disease. Electronically Signed   By: Donavan Foil M.D.   On: 09/02/2021 22:23    Microbiology: Results for orders placed or performed during the hospital encounter of 05/01/21  Urine Culture     Status: Abnormal   Collection Time: 05/01/21  6:23 PM   Specimen: Urine, Random  Result Value Ref Range Status   Specimen Description   Final    URINE, RANDOM Performed at [facility], [address] [place], [place] [address]    Special Requests   Final    NONE Performed at [facility], [address] [place], [place] [address]    Culture (A)  Final    <10,000 COLONIES/mL INSIGNIFICANT GROWTH Performed at [facility] [address], [place],  [address]    Report Status 05/03/2021 FINAL  Final  Resp Panel by RT-PCR (Flu A&B, Covid) Nasopharyngeal Swab     Status: Abnormal   Collection Time: 05/01/21 10:08 PM   Specimen: Nasopharyngeal Swab; Nasopharyngeal(NP) swabs in vial transport medium  Result Value Ref Range Status   SARS Coronavirus 2 by RT PCR POSITIVE (A) NEGATIVE Final    Comment: (NOTE) SARS-CoV-2 target nucleic acids are DETECTED.  The SARS-CoV-2 RNA is generally detectable in upper respiratory specimens during the acute phase of infection. Positive results are indicative of the presence of the identified virus, but do not rule out bacterial infection or co-infection with other pathogens not detected by the test. Clinical correlation with patient history and other diagnostic information is necessary to determine patient infection status. The  expected result is Negative.  Fact Sheet for Patients: EntrepreneurPulse.com.au  Fact Sheet for Healthcare Providers: IncredibleEmployment.be  This test is not yet approved or cleared by the [place] FDA and  has been authorized for detection and/or diagnosis of SARS-CoV-2 by FDA under an Emergency Use Authorization (EUA).  This EUA will remain in effect (meaning this test can be used) for the duration of  the COVID-19 declaration under Section 564(b)(1) of the A ct, 21 U.S.C. section 360bbb-3(b)(1), unless the authorization is terminated or revoked sooner.     Influenza A by PCR NEGATIVE NEGATIVE Final   Influenza B by PCR NEGATIVE NEGATIVE Final    Comment: (NOTE) The Xpert Xpress SARS-CoV-2/FLU/RSV plus assay is intended as an aid in the diagnosis of influenza from Nasopharyngeal swab specimens and should not be used as a sole basis for treatment. Nasal washings  and aspirates are unacceptable for Xpert Xpress SARS-CoV-2/FLU/RSV testing.  Fact Sheet for Patients: EntrepreneurPulse.com.au  Fact Sheet for Healthcare Providers: IncredibleEmployment.be  This test is not yet approved or cleared by the [place] FDA and has been authorized for detection and/or diagnosis of SARS-CoV-2 by FDA under an Emergency Use Authorization (EUA). This EUA will remain in effect (meaning this test can be used) for the duration of the COVID-19 declaration under Section 564(b)(1) of the Act, 21 U.S.C. section 360bbb-3(b)(1), unless the authorization is terminated or revoked.  Performed at [facility], [address] [place], [place] [address]     Labs: CBC: Recent Labs  Lab 09/02/21 2143  WBC 9.6  HGB 11.4*  HCT 37.2  MCV 83.6  PLT 253   Basic Metabolic Panel: Recent Labs  Lab 09/02/21 2143 09/03/21 0216 09/03/21 1117 09/04/21 0838  NA 141 141  --  143  K 5.7* 5.3* 4.8 5.3*  CL 112* 110  --  115*  CO2 23 24  --  24  GLUCOSE 206* 127*  --  114*  BUN 39* 39*  --  33*  CREATININE 1.65* 1.60*  --  1.30*  CALCIUM 7.9* 7.8*  --  8.5*   Liver Function Tests: Recent Labs  Lab 09/03/21 0216  AST 14*  ALT 16  ALKPHOS 80  BILITOT 0.5  PROT 6.2*  ALBUMIN 3.0*   CBG: Recent Labs  Lab 09/03/21 1734 09/03/21 2100 09/04/21 0808 09/04/21 1159  GLUCAP 111* 164* 114* 123*    Discharge time spent: greater than 30 minutes.  Signed: Patrecia Pour, MD [facility] 09/04/2021

## 2021-09-04 NOTE — TOC Initial Note (Signed)
Transition of Care [facility]) - Initial/Assessment Note    Patient Details  Name: Karla Sanchez MRN: [id number] Date of Birth: [birth year]  Transition of [facility]) CM/SW Contact:    Beverly Sessions, RN Phone Number: 09/04/2021, 1:15 PM  Clinical Narrative:                  Admitted for: hyperkalemia Admitted from: car/ hotel Patient states that her car is in the parking lot, and she does confirm it is in poor condition.   Patient states that she received a monthly check for 2030.  She states that after she leaves the hospital her intentions are to drive to a local hotel   PCP: [place]: [facility] Current home health/prior home health/DME: [facility]  Patient states that she has an appointment in [place] on June 20th to get her electric door on her [name] fixed that way she will be able to use her electric WC.   PT and OT has signed off as patient is at her baseline Patient alert and oriented during assessment          Patient Goals and CMS Choice        Expected Discharge Plan and Services           Expected Discharge Date: 09/04/21                                    Prior Living Arrangements/Services                       Activities of Daily Living Home Assistive Devices/Equipment: Transport planner, Wheelchair ADL Screening (condition at time of admission) Patient's cognitive ability adequate to safely complete daily activities?: Yes Is the patient deaf or have difficulty hearing?: No Does the patient have difficulty seeing, even when wearing glasses/contacts?: No Does the patient have difficulty concentrating, remembering, or making decisions?: Yes Patient able to express need for assistance with ADLs?: Yes Does the patient have difficulty dressing or bathing?: Yes Independently performs ADLs?: No Communication: Independent Dressing (OT): Independent Grooming: Independent Feeding: Independent Bathing: Needs assistance Is this a  change from baseline?: Change from baseline, expected to last >3 days Toileting: Needs assistance Is this a change from baseline?: Pre-admission baseline In/Out Bed: Independent with device (comment) Walks in Home: Independent with device (comment) (electric scooter) Does the patient have difficulty walking or climbing stairs?: Yes Weakness of Legs: Both Weakness of Arms/Hands: None  Permission Sought/Granted                  Emotional Assessment              Admission diagnosis:  Hyperkalemia [E87.5] AKI (acute kidney injury) ([place]) [N17.9] Abdominal pain, unspecified abdominal location [R10.9] Patient Active Problem List   Diagnosis Date Noted   AKI (acute kidney injury) ([place]) 09/03/2021   Type II diabetes mellitus with renal manifestations ([place]) 09/03/2021   Hyperkalemia 09/03/2021   Acute renal failure superimposed on stage 3b chronic kidney disease ([place]) 09/03/2021   HTN (hypertension) 09/03/2021   Obesity (BMI 35.0-39.9 without comorbidity) 09/03/2021   Hypomagnesemia 05/05/2021   Gastroenteritis due to COVID-19 virus 05/02/2021   Acute cystitis 05/02/2021   Chronic kidney disease, stage 3a ([place]) 05/02/2021   UTI (urinary tract infection) 03/30/2021   Acute on chronic diastolic heart failure ([place]) 04/25/2020   SOB (shortness of breath) 04/25/2020  Elevated troponin 04/25/2020   B12 deficiency    Short-term memory loss    Weakness    Fungal skin infection    Cellulitis of right leg 01/04/2020   COPD with acute exacerbation ([place]) 12/08/2019   Physical deconditioning 12/08/2019   Acute respiratory failure with hypoxia (HCC)    Iron deficiency anemia    Type 2 diabetes mellitus without complication, with long-term current use of insulin (HCC)    CHF exacerbation ([place]) 11/20/2019   Acute on chronic diastolic (congestive) heart failure ([place]) 11/19/2019   Acute pulmonary edema (HCC)    Acute on chronic diastolic CHF (congestive heart failure) ([place]) 10/22/2019    Cellulitis of right foot 10/22/2019   Non compliance with medical treatment    Chronic respiratory failure with hypoxia ([place])    Hypertension    Acute on chronic respiratory failure with hypoxia (HCC)    Diarrhea    COPD (chronic obstructive pulmonary disease) ([place]) 10/03/2019   Obesity, Class III, BMI 40-49.9 (morbid obesity) ([place]) 10/03/2019   Chronic diastolic CHF (congestive heart failure) ([place]) 07/09/2019   DM2 (diabetes mellitus, type 2) ([place]) 07/09/2019   Peripheral edema 07/09/2019   Gout 07/09/2019   Obesity, diabetes, and hypertension syndrome ([place]) 07/09/2019   Anemia of chronic disease 07/09/2019   PCP:  Alfredia Client, MD Pharmacy:   [facility] [place], [place] - [address] [place] [address] Phone: [phone] Fax: [phone]  [facility] [place], [place] - [address] [place] [address] Phone: [phone] Fax: [phone]     Social Determinants of Health (SDOH) Interventions    Readmission Risk Interventions    06/10/2020    1:59 PM 10/24/2019    2:36 PM  Readmission Risk Prevention Plan  Transportation Screening Complete   Medication Review (RN Care Manager) Complete Complete  PCP or Specialist appointment within 3-5 days of discharge Complete   HRI or Home Care Consult Complete Complete  SW Recovery Care/Counseling Consult Complete   Palliative Care Screening Not Applicable   Skilled Nursing Facility Complete Patient Refused

## 2021-09-04 NOTE — Progress Notes (Signed)
PT Cancellation Note  Patient Details Name: Karla Sanchez MRN: [id number] DOB: May 10, 1940   Cancelled Treatment:    Reason Eval/Treat Not Completed: Other (comment). Per RN pt able to stand pivot to and from the bed today with RW and without assistance. Per chart review, pt stand pivots at baseline normally to and from her WC. Pt appears to be at baseline level of functioning for mobility, PT to sign off due to no acute needs.    Lieutenant Diego PT, DPT 12:04 PM,09/04/21

## 2021-09-04 NOTE — Progress Notes (Signed)
OT Cancellation Note  Patient Details Name: Veneda Kirksey MRN: [id number] DOB: 09/08/1940   Cancelled Treatment:    Reason Eval/Treat Not Completed: OT screened, no needs identified, will sign off. Order received, chart reviewed, consulted with nursing staff and physical therapy. Pt appears to be at her baseline level of functional mobility, no new needs identified, will sign off.  Josiah Lobo, PhD, MS, OTR/L 09/04/21, 12:44 PM

## 2021-09-08 ENCOUNTER — Emergency Department
Admission: EM | Admit: 2021-09-08 | Discharge: 2021-09-09 | Disposition: A | Discharge status: Home / Self Care | Payer: Medicare Other | Attending: Emergency Medicine | Admitting: Emergency Medicine

## 2021-09-08 ENCOUNTER — Other Ambulatory Visit: Payer: Self-pay

## 2021-09-08 ENCOUNTER — Emergency Department: Payer: Medicare Other

## 2021-09-08 ENCOUNTER — Encounter: Payer: Self-pay | Admitting: *Deleted

## 2021-09-08 DIAGNOSIS — I110 Hypertensive heart disease with heart failure: Secondary | ICD-10-CM | POA: Diagnosis not present

## 2021-09-08 DIAGNOSIS — R531 Weakness: Secondary | ICD-10-CM | POA: Insufficient documentation

## 2021-09-08 DIAGNOSIS — E119 Type 2 diabetes mellitus without complications: Secondary | ICD-10-CM | POA: Diagnosis not present

## 2021-09-08 DIAGNOSIS — R1030 Lower abdominal pain, unspecified: Secondary | ICD-10-CM | POA: Diagnosis present

## 2021-09-08 DIAGNOSIS — J449 Chronic obstructive pulmonary disease, unspecified: Secondary | ICD-10-CM | POA: Diagnosis not present

## 2021-09-08 DIAGNOSIS — I509 Heart failure, unspecified: Secondary | ICD-10-CM | POA: Diagnosis not present

## 2021-09-08 LAB — CBC
HCT: 36 % (ref 36.0–46.0)
Hemoglobin: 11 g/dL — ABNORMAL LOW (ref 12.0–15.0)
MCH: 25.3 pg — ABNORMAL LOW (ref 26.0–34.0)
MCHC: 30.6 g/dL (ref 30.0–36.0)
MCV: 82.9 fL (ref 80.0–100.0)
Platelets: 283 10*3/uL (ref 150–400)
RBC: 4.34 MIL/uL (ref 3.87–5.11)
RDW: 16.1 % — ABNORMAL HIGH (ref 11.5–15.5)
WBC: 8 10*3/uL (ref 4.0–10.5)
nRBC: 0 % (ref 0.0–0.2)

## 2021-09-08 LAB — BASIC METABOLIC PANEL
Anion gap: 5 (ref 5–15)
BUN: 43 mg/dL — ABNORMAL HIGH (ref 8–23)
CO2: 22 mmol/L (ref 22–32)
Calcium: 8.2 mg/dL — ABNORMAL LOW (ref 8.9–10.3)
Chloride: 115 mmol/L — ABNORMAL HIGH (ref 98–111)
Creatinine, Ser: 1.56 mg/dL — ABNORMAL HIGH (ref 0.44–1.00)
GFR, Estimated: 33 mL/min — ABNORMAL LOW (ref >60)
Glucose, Bld: 225 mg/dL — ABNORMAL HIGH (ref 70–99)
Potassium: 5.4 mmol/L — ABNORMAL HIGH (ref 3.5–5.1)
Sodium: 142 mmol/L (ref 135–145)

## 2021-09-08 LAB — TROPONIN I (HIGH SENSITIVITY): Troponin I (High Sensitivity): 10 ng/L (ref <18)

## 2021-09-08 MED ORDER — INSULIN DETEMIR 100 UNIT/ML ~~LOC~~ SOLN
15.0000 [IU] | Freq: Two times a day (BID) | SUBCUTANEOUS | Status: DC
  Filled 2021-09-08: qty 0.15

## 2021-09-08 MED ORDER — NYSTATIN 100000 UNIT/GM EX POWD
Freq: Two times a day (BID) | CUTANEOUS | Status: DC
  Filled 2021-09-08: qty 15

## 2021-09-08 MED ORDER — TRAZODONE HCL 100 MG PO TABS
100.0000 mg | ORAL_TABLET | Freq: Every day | ORAL | Status: DC
  Filled 2021-09-08: qty 1

## 2021-09-08 MED ORDER — ACETAMINOPHEN 325 MG PO TABS
650.0000 mg | ORAL_TABLET | Freq: Four times a day (QID) | ORAL | Status: DC | PRN
Start: 2021-09-08 — End: 2021-09-09

## 2021-09-08 MED ORDER — FUROSEMIDE 40 MG PO TABS
40.0000 mg | ORAL_TABLET | Freq: Every day | ORAL | Status: DC
  Filled 2021-09-08: qty 1

## 2021-09-08 MED ORDER — INSULIN DETEMIR 100 UNIT/ML FLEXPEN
15.0000 [IU] | PEN_INJECTOR | Freq: Every day | SUBCUTANEOUS | Status: DC
Start: 2021-09-08 — End: 2021-09-08

## 2021-09-08 NOTE — ED Notes (Addendum)
ED Provider at bedside \  Pt has been in/out of hospital , recently admitted at [facility], pt states lives in her car . Wants to stay here until she feels better

## 2021-09-08 NOTE — Discharge Instructions (Signed)
Please follow-up with your doctor in the next 1 to 2 days for recheck/reevaluation.  Please return to the emergency department for any symptoms personally concerning to yourself.

## 2021-09-08 NOTE — ED Provider Notes (Addendum)
[facility] Provider Note    Event Date/Time   First MD Initiated Contact with Patient 09/08/21 1927     (approximate)  History   Chief Complaint: Chest Pain  HPI  Karla Sanchez is a 81 y.o. female with a past medical history of CHF, COPD, diabetes, hypertension, presents to the emergency department for groin and rectal pain.  According to the patient and record review patient was discharged here on 09/04/2021 after an admission for renal insufficiency ultimately discharged, and checked into [facility] 09/05/2021.  At that time patient was admitted to the hospital for ongoing yeast infection to her groin and buttocks but no other acute findings.  I reviewed our work-up in the hospital from this past week after the renal insufficiency improved there is no other acute findings.  Patient is wheelchair-bound, states she has nowhere to go.  She has no housing.  Patient is unable to get from the wheelchair to the bed without significant assistance.  Even during my evaluation is unable to sit up in the bed without significant assistance.  Physical Exam   Triage Vital Signs: ED Triage Vitals [09/08/21 1543]  Enc Vitals Group     BP (!) 156/106     Pulse Rate 66     Resp 20     Temp 98.7 F (37.1 C)     Temp Source Oral     SpO2 95 %     Weight 220 lb (99.8 kg)     Height '5\' 6"'$  (1.676 m)     Head Circumference      Peak Flow      Pain Score 8     Pain Loc      Pain Edu?      Excl. in [place]?     Most recent vital signs: Vitals:   09/08/21 1543 09/08/21 1945  BP: (!) 156/106 (!) 160/84  Pulse: 66 62  Resp: 20 17  Temp: 98.7 F (37.1 C) 98.4 F (36.9 C)  SpO2: 95% 96%    General: Awake, no distress.  CV:  Good peripheral perfusion.  Regular rate and rhythm  Resp:  Normal effort.  Equal breath sounds bilaterally.  Abd:  No distention.  Soft, nontender.  No rebound or guarding.  Patient does have mild erythema to her groin and buttocks most consistent with  Candida but no ulcerations noted.    ED Results / Procedures / Treatments   EKG  EKG viewed and interpreted by myself shows a normal sinus rhythm at 66 bpm with a widened QRS, left axis deviation, slight PR prolongation otherwise normal intervals nonspecific ST changes.  RADIOLOGY  I have interpreted the chest x-ray images no acute abnormality seen on my evaluation. Radiology is read the x-ray as mild bronchitic changes.   MEDICATIONS ORDERED IN ED: Medications - No data to display   IMPRESSION / MDM / [place] / ED COURSE  I reviewed the triage vital signs and the nursing notes.  Patient's presentation is most consistent with acute presentation with potential threat to life or bodily function.  Patient presents to the emergency department for evaluation.  Patient has multiple complaints but her major complaint is pain to her groin and buttocks.  In speaking to the patient at length it appears to be the main issue as she has nowhere to go.  She has no housing.  She lives in a [name] that is wheelchair accessible.  Patient is wheelchair-bound but is unable to  get into bed without significant assistance unable to roll over without significant assistance.  When asked why the patient came directly from [facility] ED here she states I do not know, that she had nowhere else to go.  Does not appear to be any acute medical component to why she is here or so she is unable to care for herself.  Patient has had significant recent medical work-up here in [facility] including CT imaging abdomen/pelvis that was normal.  Patient's lab work today shows a normal CBC, mild hyperkalemia however largely unchanged from historical values, renal insufficiency unchanged from historical values.  Nothing necessitating acute admission to the hospital.  Troponin is negative although patient denies any chest pain throughout my evaluation.  As the patient is unable to even sit up in bed without significant assistance I do not  believe the patient would be safe to discharge home in her current state.  We will have social work and physical therapy see in the morning.  Patient has no other complaints.  Patient now states she does not want to be placed into a nursing facility or rehab facility.  States she wishes to leave.  States she has the money to stay in a hotel and is able to drive herself.  Patient states she drove herself here and she has no problem driving herself to a hotel.  As the patient does not wish to be placed into a nursing facility we will discharge the patient from the emergency department per her wishes.  FINAL CLINICAL IMPRESSION(S) / ED DIAGNOSES   Weakness Note:  This document was prepared using Dragon voice recognition software and may include unintentional dictation errors.   Harvest Dark, MD 09/08/21 1956    Harvest Dark, MD 09/08/21 2200

## 2021-09-08 NOTE — ED Triage Notes (Signed)
Pt reports chest pain.  Pt reports right sided chest pain radiating into right side of abdomen.  No sob.  No n/v/d.  Pt alert.  Speech clear.

## 2021-09-08 NOTE — ED Provider Triage Note (Signed)
Emergency Medicine Provider Triage Evaluation Note  Baelyn Doring , a 81 y.o. female  was evaluated in triage.  Pt complains of complaints of shortness of breath and palpitations.  Patient is stating that she drove herself all the way from [place].  States she was discharged from [facility] few days ago.  Feeling worse..  Review of Systems  Positive: Palpitations, shortness of breath Negative: Fever chills  Physical Exam  There were no vitals taken for this visit. Gen:   Awake, no distress   Resp:  Normal effort  MSK:   Moves extremities without difficulty  Other:    Medical Decision Making  Medically screening exam initiated at 3:29 PM.  Appropriate orders placed.  Angely Dietz Napakiak was informed that the remainder of the evaluation will be completed by another provider, this initial triage assessment does not replace that evaluation, and the importance of remaining in the ED until their evaluation is complete.  Patient appears to be disheveled and does smell of urine   Versie Starks, PA-C 09/08/21 1530

## 2021-09-08 NOTE — ED Notes (Addendum)
Lab unable to obtain blood draw on patient, This RN asked lab to send another phlebotomist to draw blood.

## 2021-09-08 NOTE — ED Notes (Signed)
ED Provider at bedside. 

## 2021-09-09 DIAGNOSIS — R1030 Lower abdominal pain, unspecified: Secondary | ICD-10-CM | POA: Diagnosis not present

## 2021-09-09 LAB — CBG MONITORING, ED: Glucose-Capillary: 113 mg/dL — ABNORMAL HIGH (ref 70–99)

## 2021-09-09 NOTE — ED Notes (Signed)
Pt DC to home. Dc instructions reviewed. Pt refused ordered medications despite education on insulin and lasix. Pt assisted out of dept via wheelchair by Lorriane Shire, Camera operator.

## 2021-10-01 ENCOUNTER — Emergency Department: Payer: Medicare Other

## 2021-10-01 ENCOUNTER — Other Ambulatory Visit: Payer: Self-pay

## 2021-10-01 DIAGNOSIS — N189 Chronic kidney disease, unspecified: Secondary | ICD-10-CM | POA: Diagnosis not present

## 2021-10-01 DIAGNOSIS — M25562 Pain in left knee: Secondary | ICD-10-CM | POA: Diagnosis not present

## 2021-10-01 DIAGNOSIS — E1122 Type 2 diabetes mellitus with diabetic chronic kidney disease: Secondary | ICD-10-CM | POA: Insufficient documentation

## 2021-10-01 DIAGNOSIS — J449 Chronic obstructive pulmonary disease, unspecified: Secondary | ICD-10-CM | POA: Insufficient documentation

## 2021-10-01 DIAGNOSIS — I509 Heart failure, unspecified: Secondary | ICD-10-CM | POA: Insufficient documentation

## 2021-10-01 DIAGNOSIS — M533 Sacrococcygeal disorders, not elsewhere classified: Secondary | ICD-10-CM | POA: Diagnosis present

## 2021-10-01 DIAGNOSIS — N390 Urinary tract infection, site not specified: Secondary | ICD-10-CM | POA: Insufficient documentation

## 2021-10-01 DIAGNOSIS — G8929 Other chronic pain: Secondary | ICD-10-CM | POA: Diagnosis not present

## 2021-10-01 NOTE — ED Triage Notes (Signed)
Pt arrived POV alert and oriented. Pt states she was short of breath in the heat but it has improved some in the air conditioner. Pt is wearing paper scrubs from another facility stating it is the only thing she has to wear. Pt also soiled with urine and states she is incontinent but not wearing a brief , however she brought briefs with her.

## 2021-10-02 ENCOUNTER — Emergency Department
Admission: EM | Admit: 2021-10-02 | Discharge: 2021-10-02 | Disposition: A | Discharge status: Home / Self Care | Payer: Medicare Other | Attending: Emergency Medicine | Admitting: Emergency Medicine

## 2021-10-02 ENCOUNTER — Other Ambulatory Visit: Payer: Self-pay

## 2021-10-02 DIAGNOSIS — G8929 Other chronic pain: Secondary | ICD-10-CM

## 2021-10-02 DIAGNOSIS — M533 Sacrococcygeal disorders, not elsewhere classified: Secondary | ICD-10-CM | POA: Diagnosis not present

## 2021-10-02 DIAGNOSIS — N390 Urinary tract infection, site not specified: Secondary | ICD-10-CM

## 2021-10-02 LAB — BASIC METABOLIC PANEL
Anion gap: 6 (ref 5–15)
BUN: 36 mg/dL — ABNORMAL HIGH (ref 8–23)
CO2: 24 mmol/L (ref 22–32)
Calcium: 8.5 mg/dL — ABNORMAL LOW (ref 8.9–10.3)
Chloride: 110 mmol/L (ref 98–111)
Creatinine, Ser: 1.47 mg/dL — ABNORMAL HIGH (ref 0.44–1.00)
GFR, Estimated: 36 mL/min — ABNORMAL LOW (ref >60)
Glucose, Bld: 153 mg/dL — ABNORMAL HIGH (ref 70–99)
Potassium: 4.8 mmol/L (ref 3.5–5.1)
Sodium: 140 mmol/L (ref 135–145)

## 2021-10-02 LAB — CBC
HCT: 36.5 % (ref 36.0–46.0)
Hemoglobin: 11.2 g/dL — ABNORMAL LOW (ref 12.0–15.0)
MCH: 26 pg (ref 26.0–34.0)
MCHC: 30.7 g/dL (ref 30.0–36.0)
MCV: 84.7 fL (ref 80.0–100.0)
Platelets: 282 10*3/uL (ref 150–400)
RBC: 4.31 MIL/uL (ref 3.87–5.11)
RDW: 16 % — ABNORMAL HIGH (ref 11.5–15.5)
WBC: 10.1 10*3/uL (ref 4.0–10.5)
nRBC: 0 % (ref 0.0–0.2)

## 2021-10-02 LAB — URINALYSIS, ROUTINE W REFLEX MICROSCOPIC
Bilirubin Urine: NEGATIVE
Glucose, UA: NEGATIVE mg/dL
Hgb urine dipstick: NEGATIVE
Ketones, ur: NEGATIVE mg/dL
Nitrite: NEGATIVE
Protein, ur: >=300 mg/dL — AB
Specific Gravity, Urine: 1.009 (ref 1.005–1.030)
WBC, UA: >50 WBC/hpf — ABNORMAL HIGH (ref 0–5)
pH: 6 (ref 5.0–8.0)

## 2021-10-02 LAB — CBG MONITORING, ED: Glucose-Capillary: 172 mg/dL — ABNORMAL HIGH (ref 70–99)

## 2021-10-02 MED ORDER — TRAMADOL HCL 50 MG PO TABS: 50.0000 mg | ORAL_TABLET | Freq: Three times a day (TID) | ORAL | 0 refills | Status: AC | PRN

## 2021-10-02 MED ORDER — FOSFOMYCIN TROMETHAMINE 3 G PO PACK
3.0000 g | PACK | ORAL | Status: AC
  Administered 2021-10-02: 3 g via ORAL
  Filled 2021-10-02: qty 3

## 2021-10-02 MED ORDER — ACETAMINOPHEN 325 MG PO TABS
650.0000 mg | ORAL_TABLET | Freq: Once | ORAL | Status: AC
  Administered 2021-10-02: 650 mg via ORAL
  Filled 2021-10-02: qty 2

## 2021-10-02 NOTE — Discharge Instructions (Addendum)
No driving while taking tramadol.  Only use as prescribed.  You were treated for urinary tract infection using a medicine that is given as a single dose here at the ER.  If you notice that you have symptoms such as fever, chills, back or flank pain, weakness, worsening symptoms, or feel your infection has not improved please return to the emergency room right away.  Follow-up closely with your physician.  Do not drive within 8 hours of use of tramadol which I have prescribed for you on a short-term basis for your chronic sacral and left knee pain.  Do not mix this with other medications such as trazodone.

## 2021-10-02 NOTE — ED Provider Notes (Signed)
[facility] Provider Note    Event Date/Time   First MD Initiated Contact with Patient 10/02/21 [phone]     (approximate)   History   Shortness of Breath  HPI  Karla Sanchez is a 81 y.o. female who on review of records from outside hospital system female homeless and wheelchair-bound with PMHx of DM, CKD, HFpEF, and COPD.     Home patient reports that she felt short of breath yesterday in the heat.  She drove here in her car.  Now she reports she feels fine her breathing is normal, she reports she actually started to feel like her breathing got better as soon as she got into the ER and into the air conditioning  No chest pain no trouble breathing now.  Denies wheezing.  Reports she feels much better.  Currently reports pain around her buttock and tailbone as well as pain in her left knee.  Reports both of these occurred starting about a little over a month ago or so when she was hospitalized and had a fall.  She has had x-rays and a CT scan been told she has no fractures and has been having to struggle with pain in this area for over a month now  She lives in [place] and a home now, but advises that she is thinking of moving to [place].  No symptoms of pain or burning with urination. (Noted history of prior UTIs)    Physical Exam   Triage Vital Signs: ED Triage Vitals  Enc Vitals Group     BP 10/01/21 2134 (!) 144/95     Pulse Rate 10/01/21 2134 68     Resp 10/01/21 2134 19     Temp 10/01/21 2134 97.8 F (36.6 C)     Temp Source 10/01/21 2134 Oral     SpO2 10/01/21 2134 95 %     Weight 10/01/21 2136 220 lb (99.8 kg)     Height 10/01/21 2136 '5\' 6"'$  (1.676 m)     Head Circumference --      Peak Flow --      Pain Score 10/01/21 2135 9     Pain Loc --      Pain Edu? --      Excl. in [place]? --     Most recent vital signs: Vitals:   10/02/21 0831 10/02/21 1111  BP: (!) 157/63 (!) 165/55  Pulse: (!) 52 (!) 54  Resp: 17 16  Temp: 98 F (36.7  C)   SpO2: 97% 99%     General: Awake, no distress.  Very pleasant.  Normal work of breathing. CV:  Good peripheral perfusion.  Normal heart sounds Resp:  Normal effort.  Clear bilaterally.  Normal work of breathing.  Patient reports no difficulty breathing. Abd:  No distention.  Soft nontender Other:  Is left knee good range of motion reports mild pain through range of motion there is no trauma or injury to noted no obvious large effusion or warmth.  She reports areas been sore for over a month since falling on it but had a CT scan and x-rays done already. Patient no thoracic or lumbar tenderness.  Reports point tenderness over the distal sacrum.  Reports this is also been present for over a month stems from a previous fall while at hospital in [facility]   ED Results / Procedures / Treatments   Labs (all labs ordered are listed, but only abnormal results are displayed) Labs Reviewed  CBC -  Abnormal; Notable for the following components:      Result Value   Hemoglobin 11.2 (*)    RDW 16.0 (*)    All other components within normal limits  BASIC METABOLIC PANEL - Abnormal; Notable for the following components:   Glucose, Bld 153 (*)    BUN 36 (*)    Creatinine, Ser 1.47 (*)    Calcium 8.5 (*)    GFR, Estimated 36 (*)    All other components within normal limits  URINALYSIS, ROUTINE W REFLEX MICROSCOPIC - Abnormal; Notable for the following components:   Color, Urine YELLOW (*)    APPearance CLOUDY (*)    Protein, ur >=300 (*)    Leukocytes,Ua LARGE (*)    WBC, UA >50 (*)    Bacteria, UA FEW (*)    All other components within normal limits  CBG MONITORING, ED - Abnormal; Notable for the following components:   Glucose-Capillary 172 (*)    All other components within normal limits  URINE CULTURE     EKG  Interpreted by me at 802 heart rate 55 QRS 160 QTc 430 Right bundle branch block.  Mild ST segment inversions noted in inferior and slight lateral precordial distribution,  however compared with previous EKG this appears to be pre-existing.  No acute changes noted   RADIOLOGY DG Chest 2 View  Result Date: 10/01/2021 CLINICAL DATA:  Dyspnea EXAM: CHEST - 2 VIEW COMPARISON:  09/08/2021 FINDINGS: The heart size and mediastinal contours are within normal limits. Both lungs are clear. The visualized skeletal structures are unremarkable. IMPRESSION: No active cardiopulmonary disease. Electronically Signed   By: Fidela Salisbury M.D.   On: 10/01/2021 22:18       PROCEDURES:  Critical Care performed: No  Procedures   MEDICATIONS ORDERED IN ED: Medications  fosfomycin (MONUROL) packet 3 g (has no administration in time range)  acetaminophen (TYLENOL) tablet 650 mg (650 mg Oral Given 10/02/21 0820)     IMPRESSION / MDM / ASSESSMENT AND PLAN / ED COURSE  I reviewed the triage vital signs and the nursing notes.                              Differential diagnosis includes, but is not limited to, heat exposure, COPD, CHF, ACS etc.  She does not have any persistent symptoms as I am seeing her.  She has no tachycardia no hypoxia, no evidence that would support ACS by the clinical history provided and does not have chest pain.  Her EKG appears to be at baseline.  Her work of breathing is normalized now after being at the hospital in an air conditioning.  She does have a odor to her of possible urinary tract infection, and on her labs today is suspicious for urinary tract infection though she denies dysuria or obvious symptoms of UTI she does carry a history of frequent urinary tract infections.  She shows no evidence of urosepsis.  Will treat with single dose fosfomycin, recommend she follow-up with her primary care physician.  She advises she does have a car, she now has housing, is currently living in [place].  Patient's presentation is most consistent with acute illness / injury with system symptoms.   Clinical Course as of 10/02/21 1134  Thu Oct 02, 2021  8242 CBC  and metabolic panel reviewed.  Negative for acute abnormality.  Labs appear in keeping with previous including chronic renal disease [MQ]  Clinical Course User Index [MQ] Delman Kitten, MD   Labs demonstrate a mild chronic anemia.   ----------------------------------------- 11:34 AM on 10/02/2021 ----------------------------------------- Discussed with patient.  Updated on plan of care, will treat with fosfomycin for bacteria in urine, sent for culture.  Further antibiotic tailoring if needed.  Patient will follow-up with her primary care doctor.  She does have a history on review of her previous hospital visits from outside hospitals as well of having multiple social issues, but at this point she reports that she is driving her own car, she has money for gas and tends to get gas at the gas station just outside the hospital entrance, she is planning to travel back to her home where she is no longer homeless but living in a home in [place] where have sent a prescription for tramadol for her.  She is comfortable with follow-up with her primary care doctor.  All of her symptomatology has resolved.  She appears appropriate for discharge at this time  Return precautions and treatment recommendations and follow-up discussed with the patient who is agreeable with the plan.    FINAL CLINICAL IMPRESSION(S) / ED DIAGNOSES   Final diagnoses:  Chronic pain of left knee  Chronic sacroiliac joint pain  Lower urinary tract infection, acute     Rx / DC Orders   ED Discharge Orders          Ordered    traMADol (ULTRAM) 50 MG tablet  Every 8 hours PRN        10/02/21 1133             Note:  This document was prepared using Dragon voice recognition software and may include unintentional dictation errors.   Delman Kitten, MD 10/02/21 1135

## 2021-10-05 LAB — URINE CULTURE: Culture: >=100000 — AB

## 2021-10-06 ENCOUNTER — Encounter (HOSPITAL_COMMUNITY): Payer: Self-pay | Admitting: Emergency Medicine

## 2021-10-06 ENCOUNTER — Emergency Department (HOSPITAL_COMMUNITY): Payer: Medicare Other

## 2021-10-06 ENCOUNTER — Emergency Department (HOSPITAL_COMMUNITY)
Admission: EM | Admit: 2021-10-06 | Discharge: 2021-10-07 | Disposition: A | Discharge status: Home / Self Care | Payer: Medicare Other | Attending: Emergency Medicine | Admitting: Emergency Medicine

## 2021-10-06 ENCOUNTER — Other Ambulatory Visit: Payer: Self-pay

## 2021-10-06 DIAGNOSIS — M25562 Pain in left knee: Secondary | ICD-10-CM | POA: Diagnosis not present

## 2021-10-06 DIAGNOSIS — Z794 Long term (current) use of insulin: Secondary | ICD-10-CM | POA: Diagnosis not present

## 2021-10-06 DIAGNOSIS — J449 Chronic obstructive pulmonary disease, unspecified: Secondary | ICD-10-CM | POA: Diagnosis not present

## 2021-10-06 DIAGNOSIS — I130 Hypertensive heart and chronic kidney disease with heart failure and stage 1 through stage 4 chronic kidney disease, or unspecified chronic kidney disease: Secondary | ICD-10-CM | POA: Diagnosis not present

## 2021-10-06 DIAGNOSIS — N1832 Chronic kidney disease, stage 3b: Secondary | ICD-10-CM | POA: Insufficient documentation

## 2021-10-06 DIAGNOSIS — M7989 Other specified soft tissue disorders: Secondary | ICD-10-CM | POA: Insufficient documentation

## 2021-10-06 DIAGNOSIS — M25471 Effusion, right ankle: Secondary | ICD-10-CM

## 2021-10-06 DIAGNOSIS — E1122 Type 2 diabetes mellitus with diabetic chronic kidney disease: Secondary | ICD-10-CM | POA: Diagnosis not present

## 2021-10-06 DIAGNOSIS — I5030 Unspecified diastolic (congestive) heart failure: Secondary | ICD-10-CM | POA: Insufficient documentation

## 2021-10-06 DIAGNOSIS — Z5900 Homelessness unspecified: Secondary | ICD-10-CM | POA: Diagnosis not present

## 2021-10-06 DIAGNOSIS — Z85038 Personal history of other malignant neoplasm of large intestine: Secondary | ICD-10-CM | POA: Diagnosis not present

## 2021-10-06 DIAGNOSIS — G8929 Other chronic pain: Secondary | ICD-10-CM | POA: Diagnosis not present

## 2021-10-06 LAB — COMPREHENSIVE METABOLIC PANEL
ALT: 14 U/L (ref 0–44)
AST: 14 U/L — ABNORMAL LOW (ref 15–41)
Albumin: 2.5 g/dL — ABNORMAL LOW (ref 3.5–5.0)
Alkaline Phosphatase: 67 U/L (ref 38–126)
Anion gap: 7 (ref 5–15)
BUN: 54 mg/dL — ABNORMAL HIGH (ref 8–23)
CO2: 23 mmol/L (ref 22–32)
Calcium: 8 mg/dL — ABNORMAL LOW (ref 8.9–10.3)
Chloride: 108 mmol/L (ref 98–111)
Creatinine, Ser: 1.91 mg/dL — ABNORMAL HIGH (ref 0.44–1.00)
GFR, Estimated: 26 mL/min — ABNORMAL LOW (ref >60)
Glucose, Bld: 170 mg/dL — ABNORMAL HIGH (ref 70–99)
Potassium: 4.8 mmol/L (ref 3.5–5.1)
Sodium: 138 mmol/L (ref 135–145)
Total Bilirubin: 0.1 mg/dL — ABNORMAL LOW (ref 0.3–1.2)
Total Protein: 5.8 g/dL — ABNORMAL LOW (ref 6.5–8.1)

## 2021-10-06 LAB — CBC WITH DIFFERENTIAL/PLATELET
Abs Immature Granulocytes: 0.04 10*3/uL (ref 0.00–0.07)
Basophils Absolute: 0 10*3/uL (ref 0.0–0.1)
Basophils Relative: 0 %
Eosinophils Absolute: 0.1 10*3/uL (ref 0.0–0.5)
Eosinophils Relative: 2 %
HCT: 30 % — ABNORMAL LOW (ref 36.0–46.0)
Hemoglobin: 9.5 g/dL — ABNORMAL LOW (ref 12.0–15.0)
Immature Granulocytes: 1 %
Lymphocytes Relative: 27 %
Lymphs Abs: 2.4 10*3/uL (ref 0.7–4.0)
MCH: 26.9 pg (ref 26.0–34.0)
MCHC: 31.7 g/dL (ref 30.0–36.0)
MCV: 85 fL (ref 80.0–100.0)
Monocytes Absolute: 0.8 10*3/uL (ref 0.1–1.0)
Monocytes Relative: 9 %
Neutro Abs: 5.4 10*3/uL (ref 1.7–7.7)
Neutrophils Relative %: 61 %
Platelets: 256 10*3/uL (ref 150–400)
RBC: 3.53 MIL/uL — ABNORMAL LOW (ref 3.87–5.11)
RDW: 15.9 % — ABNORMAL HIGH (ref 11.5–15.5)
WBC: 8.8 10*3/uL (ref 4.0–10.5)
nRBC: 0 % (ref 0.0–0.2)
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
## 2021-10-07 DIAGNOSIS — M7989 Other specified soft tissue disorders: Secondary | ICD-10-CM | POA: Diagnosis not present

## 2021-10-07 LAB — URINALYSIS, ROUTINE W REFLEX MICROSCOPIC
Bilirubin Urine: NEGATIVE
Glucose, UA: 50 mg/dL — AB
Hgb urine dipstick: NEGATIVE
Ketones, ur: NEGATIVE mg/dL
Nitrite: NEGATIVE
Protein, ur: 100 mg/dL — AB
Specific Gravity, Urine: 1.012 (ref 1.005–1.030)
pH: 5 (ref 5.0–8.0)

## 2021-10-07 LAB — RAPID URINE DRUG SCREEN, HOSP PERFORMED
Amphetamines: NOT DETECTED
Barbiturates: NOT DETECTED
Benzodiazepines: NOT DETECTED
Cocaine: NOT DETECTED
Opiates: NOT DETECTED
Tetrahydrocannabinol: NOT DETECTED

## 2021-10-07 MED ORDER — ACETAMINOPHEN 325 MG PO TABS
650.0000 mg | ORAL_TABLET | Freq: Four times a day (QID) | ORAL | Status: DC | PRN
Start: 2021-10-07 — End: 2021-10-07

## 2021-10-07 MED ORDER — TRAZODONE HCL 50 MG PO TABS
100.0000 mg | ORAL_TABLET | Freq: Every evening | ORAL | Status: DC | PRN
Start: 2021-10-07 — End: 2021-10-07

## 2021-10-07 MED ORDER — FUROSEMIDE 40 MG PO TABS
40.0000 mg | ORAL_TABLET | Freq: Two times a day (BID) | ORAL | Status: DC
  Administered 2021-10-07: 40 mg via ORAL
  Filled 2021-10-07: qty 1

## 2021-10-07 MED ORDER — INSULIN DETEMIR 100 UNIT/ML ~~LOC~~ SOLN
15.0000 [IU] | Freq: Every day | SUBCUTANEOUS | Status: DC
  Filled 2021-10-07 (×2): qty 0.15

## 2021-10-07 MED ORDER — TRAMADOL HCL 50 MG PO TABS: 50.0000 mg | ORAL_TABLET | Freq: Four times a day (QID) | ORAL | Status: DC | PRN

## 2021-10-07 NOTE — ED Notes (Signed)
TOC consulted for SNF placement. CSW noted per chart review that pt does not meet SNF criteria as she will not have a three midnight stay as an inpatient admitted to the hospital.   CSW met with pt to speak about this. CSW explained that pt could private pay for SNF. Pt states she does not have the funds for this.   Pt stated that she has an apartment in [place] that will be ready in 3 weeks for her to move in. Pt states that she plans to stay in a hotel until it is ready.  CSW updated RN and MD. TOC signing off.

## 2021-10-07 NOTE — Discharge Instructions (Signed)
Follow up as needed

## 2021-10-07 NOTE — ED Provider Notes (Addendum)
[facility] EMERGENCY DEPARTMENT Provider Note   CSN: [id number] Arrival date & time: 10/06/21  1847     History  Chief Complaint  Patient presents with   Homeless    Karla Sanchez is a 81 y.o. female.  Patient brought in by state troopers.  Patient was found driving erratically on [address].  Patient apparently did not have a license.  Patient states that she is homeless.  Patient was wearing a gown soiled with urine and feces patient has a 50 year old sister in the area that at home apparently records show the patient has been seen down in [facility] several times seen July 13 for chronic pain left knee at [facility].  Seen June 19 for weakness at [facility].  So frequently seen there.  Known to have acute on chronic congestive heart failure as well.  Patient also seen at [facility] on July 15 July 14 and then was seen twice on July 13 once at [facility] and the other day at [place].  Patient also complaining of some swelling to the right ankle.  Patient has been on colchicine in the past for gout.  Patient currently very sleepy now but patient was awake and alert earlier.  Says that she has been sleeping in her vehicle.  And that she is homeless.  Past medical history significant for diabetes hypertension COPD diastolic heart failure history of colon cancer status post right and left colectomy is in 2020.  Past surgical history only significant for tubal ligation patient is not a smoker.       Home Medications Prior to Admission medications   Medication Sig Start Date End Date Taking? Authorizing Provider  acetaminophen (TYLENOL) 325 MG tablet Take 650 mg by mouth every 6 (six) hours as needed for mild pain or fever.     [provider]  albuterol (VENTOLIN HFA) 108 (90 Base) MCG/ACT inhaler Inhale 2 puffs into the lungs every 6 (six) hours as needed for wheezing or shortness of breath. Patient not taking: Reported on 09/03/2021 01/09/20   Loletha Grayer, MD  colchicine  0.6 MG tablet Take 1 tablet (0.6 mg total) by mouth daily. 05/13/21   Jennye Boroughs, MD  furosemide (LASIX) 40 MG tablet Take 1 tablet (40 mg total) by mouth daily. Take '80mg'$  po qam and '40mg'$  po qpm 05/09/21   Jennye Boroughs, MD  insulin detemir (LEVEMIR FLEXTOUCH) 100 UNIT/ML FlexPen Inject 15 Units into the skin at bedtime. 05/19/21   [provider]  [facility] 17 GM/SCOOP powder Take by mouth. 03/04/21   [provider]  traMADol (ULTRAM) 50 MG tablet Take 1 tablet (50 mg total) by mouth every 8 (eight) hours as needed. 10/02/21   Delman Kitten, MD  traZODone (DESYREL) 100 MG tablet Take 1 tablet by mouth at bedtime as needed. 02/28/21 05/29/21  [provider]  TRELEGY ELLIPTA 100-62.5-25 MCG/ACT AEPB Inhale 1 puff into the lungs daily. Patient not taking: Reported on 09/03/2021 03/28/21   [provider]      Allergies    Codeine, Other, Exenatide, Levofloxacin, Losartan, Sulfa antibiotics, Tetracyclines & related, and Statins    Review of Systems   Review of Systems  Constitutional:  Negative for chills and fever.  HENT:  Negative for ear pain and sore throat.   Eyes:  Negative for pain and visual disturbance.  Respiratory:  Negative for cough and shortness of breath.   Cardiovascular:  Negative for chest pain and palpitations.  Gastrointestinal:  Negative for abdominal pain  and vomiting.  Genitourinary:  Negative for dysuria and hematuria.  Musculoskeletal:  Positive for joint swelling. Negative for arthralgias and back pain.  Skin:  Negative for color change and rash.  Neurological:  Negative for seizures and syncope.  All other systems reviewed and are negative.   Physical Exam Updated Vital Signs BP (!) 145/63   Pulse 62   Temp 98.8 F (37.1 C) (Oral)   Resp 18   Ht 1.676 m ('5\' 6"'$ )   Wt 99.8 kg   SpO2 93%   BMI 35.51 kg/m  Physical Exam Vitals and nursing note reviewed.  Constitutional:      General: She is not in acute distress.     Appearance: Normal appearance. She is well-developed.  HENT:     Head: Normocephalic and atraumatic.  Eyes:     Extraocular Movements: Extraocular movements intact.     Conjunctiva/sclera: Conjunctivae normal.     Pupils: Pupils are equal, round, and reactive to light.  Cardiovascular:     Rate and Rhythm: Normal rate and regular rhythm.     Heart sounds: No murmur heard. Pulmonary:     Effort: Pulmonary effort is normal. No respiratory distress.     Breath sounds: Normal breath sounds. No wheezing, rhonchi or rales.  Abdominal:     Palpations: Abdomen is soft.     Tenderness: There is no abdominal tenderness.  Musculoskeletal:        General: No swelling.     Cervical back: Neck supple.     Right lower leg: Edema present.     Comments: Swelling and erythema to the right ankle.  Slight increased warmth.  Good cap refill to both toes.  Slight edema to both lower extremities.  Left knee without any obvious deformity no erythema no effusion.  Nontender to palpation.  Skin:    General: Skin is warm and dry.     Capillary Refill: Capillary refill takes less than 2 seconds.  Neurological:     General: No focal deficit present.     Mental Status: She is alert and oriented to person, place, and time.     Cranial Nerves: No cranial nerve deficit.     Sensory: No sensory deficit.     Motor: No weakness.  Psychiatric:        Mood and Affect: Mood normal.     ED Results / Procedures / Treatments   Labs (all labs ordered are listed, but only abnormal results are displayed) Labs Reviewed  CBC WITH DIFFERENTIAL/PLATELET - Abnormal; Notable for the following components:      Result Value   RBC 3.53 (*)    Hemoglobin 9.5 (*)    HCT 30.0 (*)    RDW 15.9 (*)    All other components within normal limits  COMPREHENSIVE METABOLIC PANEL - Abnormal; Notable for the following components:   Glucose, Bld 170 (*)    BUN 54 (*)    Creatinine, Ser 1.91 (*)    Calcium 8.0 (*)    Total Protein 5.8  (*)    Albumin 2.5 (*)    AST 14 (*)    Total Bilirubin 0.1 (*)    GFR, Estimated 26 (*)    All other components within normal limits  URINALYSIS, ROUTINE W REFLEX MICROSCOPIC  RAPID URINE DRUG SCREEN, HOSP PERFORMED    EKG None  Radiology DG Knee Complete 4 Views Left  Result Date: 10/07/2021 CLINICAL DATA:  Chronic left knee pain. EXAM: LEFT KNEE - COMPLETE 4+  VIEW COMPARISON:  None Available. FINDINGS: The bones are under mineralized. No fracture or dislocation. Moderate tricompartmental osteoarthritis. Moderate knee joint effusion. Generalized soft tissue edema. There are vascular calcifications. IMPRESSION: 1. Moderate tricompartmental osteoarthritis and moderate knee joint effusion. No acute osseous abnormality. 2. Generalized soft tissue edema. Electronically Signed   By: Keith Rake M.D.   On: 10/07/2021 00:20   DG Ankle Complete Right  Result Date: 10/07/2021 CLINICAL DATA:  Right ankle swelling. EXAM: RIGHT ANKLE - COMPLETE 3+ VIEW COMPARISON:  Foot radiograph 03/30/2021 FINDINGS: The bones are under mineralized. No fracture. Normal alignment. The ankle mortise is preserved. Mild enthesopathic change about the medial malleolus. Minimal tibial talar spurring. No erosion, periosteal reaction, or bone destruction. Prominent vascular calcifications. Diffuse soft tissue edema. IMPRESSION: 1. Diffuse soft tissue edema. No acute osseous abnormality. 2. Osteopenia/osteoporosis. Electronically Signed   By: Keith Rake M.D.   On: 10/07/2021 00:18   DG Chest Port 1 View  Result Date: 10/07/2021 CLINICAL DATA:  Homeless. EXAM: PORTABLE CHEST 1 VIEW COMPARISON:  Radiograph 10/01/2021 FINDINGS: Patient is leaning to the left, moderate rotation. Stable heart size and mediastinal contours. Aortic atherosclerosis. No focal airspace disease, pleural effusion, or pneumothorax. No acute osseous findings. IMPRESSION: No acute chest findings. Electronically Signed   By: Keith Rake M.D.    On: 10/07/2021 00:17    Procedures Procedures    Medications Ordered in ED Medications  acetaminophen (TYLENOL) tablet 650 mg (has no administration in time range)  insulin detemir (LEVEMIR) injection 15 Units (has no administration in time range)  traZODone (DESYREL) tablet 100 mg (has no administration in time range)  furosemide (LASIX) tablet 40 mg (has no administration in time range)    ED Course/ Medical Decision Making/ A&P                           Medical Decision Making Amount and/or Complexity of Data Reviewed Labs: ordered. Radiology: ordered.  Risk OTC drugs. Prescription drug management.   Patient's labs CBC no leukocytosis hemoglobin down a little bit compared to recent ones at 9.5.  Platelets are normal.  Complete metabolic panel glucose 937 potassium good sodium good GFR little worse than baseline at 26.  Patient usually in the 32s.  Liver function test without significant abnormalities.  Renal insufficiency is a little bit worse than baseline.  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  So 26 a little bit worse.  This may need to be followed.  Chest x-ray and x-ray of the left knee and right ankle are pending.  Results urine chest x-ray no acute findings.  X-rays of the left knee seem to be consistent with chronic changes and findings as per history.  No acute fracture.  X-ray of the right ankle does show soft tissue swelling.  Could be secondary to gouty arthritis.   No acute indication for admission.  Swelling to the right ankle could be secondary to gout.  Patient's blood sugars reasonable here.  We will treat her with hydrocodone codon's as needed as needed for the gout pain.  Will not start her on prednisone at this time.  We will put in social worker consult for placement.    Final Clinical Impression(s) / ED Diagnoses Final diagnoses:  Chronic pain of left knee  Right ankle swelling  Stage 3b chronic kidney disease ([place])    Rx /  DC Orders ED Discharge Orders     None  Fredia Sorrow, MD 10/07/21 2641    Fredia Sorrow, MD 10/07/21 Laureen Abrahams

## 2021-11-02 ENCOUNTER — Other Ambulatory Visit: Payer: Self-pay

## 2021-11-02 DIAGNOSIS — G8929 Other chronic pain: Secondary | ICD-10-CM | POA: Diagnosis not present

## 2021-11-02 DIAGNOSIS — I509 Heart failure, unspecified: Secondary | ICD-10-CM | POA: Insufficient documentation

## 2021-11-02 DIAGNOSIS — I110 Hypertensive heart disease with heart failure: Secondary | ICD-10-CM | POA: Insufficient documentation

## 2021-11-02 DIAGNOSIS — M25562 Pain in left knee: Secondary | ICD-10-CM | POA: Diagnosis present

## 2021-11-02 DIAGNOSIS — K5900 Constipation, unspecified: Secondary | ICD-10-CM | POA: Diagnosis not present

## 2021-11-02 DIAGNOSIS — J449 Chronic obstructive pulmonary disease, unspecified: Secondary | ICD-10-CM | POA: Insufficient documentation

## 2021-11-02 DIAGNOSIS — E119 Type 2 diabetes mellitus without complications: Secondary | ICD-10-CM | POA: Insufficient documentation

## 2021-11-02 NOTE — ED Triage Notes (Signed)
Pt assisted out of car in parking lot with max assist x2 person. Pt states she has been having continued left knee pain after a fall months ago. Pt states she also has hemmorrhoids that are causing her pain that she would like to "have checked" as well.

## 2021-11-03 ENCOUNTER — Emergency Department
Admission: EM | Admit: 2021-11-03 | Discharge: 2021-11-03 | Disposition: A | Discharge status: Home / Self Care | Payer: Medicare Other | Attending: Emergency Medicine | Admitting: Emergency Medicine

## 2021-11-03 DIAGNOSIS — M25562 Pain in left knee: Secondary | ICD-10-CM | POA: Diagnosis not present

## 2021-11-03 DIAGNOSIS — G8929 Other chronic pain: Secondary | ICD-10-CM

## 2021-11-03 MED ORDER — POLYETHYLENE GLYCOL 3350 17 GM/SCOOP PO POWD: 17.0000 g | Freq: Every day | ORAL | 0 refills | Status: AC

## 2021-11-03 MED ORDER — OXYCODONE-ACETAMINOPHEN 5-325 MG PO TABS
1.0000 | ORAL_TABLET | Freq: Once | ORAL | Status: AC
  Administered 2021-11-03: 1 via ORAL
  Filled 2021-11-03: qty 1

## 2021-11-03 NOTE — ED Provider Notes (Signed)
[facility] Provider Note    Event Date/Time   First MD Initiated Contact with Patient 11/03/21 0149     (approximate)  History   Chief Complaint: Knee Pain and Rectal Pain  HPI  Karla Sanchez is a 81 y.o. female with a past medical history of CHF, COPD, diabetes, hypertension, presents to the emergency department for evaluation.  According to the patient she just left [facility] emergency department and came back to [place].  She states she tried to go to the pharmacy but they were already closed so she came here because she was having increased pain in her left knee.  Patient was hoping to get pain medication for her left knee pain and then will go to the pharmacy tomorrow to pick up her prescription.  She also states she has felt constipated over the last 2 to 3 days.  No abdominal pain.  Patient wishes to be discharged back to her [name] after she gets pain medication.  Physical Exam   Triage Vital Signs: ED Triage Vitals  Enc Vitals Group     BP 11/02/21 2337 (!) 152/97     Pulse Rate 11/02/21 2348 68     Resp 11/02/21 2336 16     Temp 11/02/21 2336 98.8 F (37.1 C)     Temp Source 11/02/21 2336 Oral     SpO2 11/02/21 2348 96 %     Weight 11/02/21 2336 218 lb 4.1 oz (99 kg)     Height 11/02/21 2336 '5\' 6"'$  (1.676 m)     Head Circumference --      Peak Flow --      Pain Score 11/02/21 2336 10     Pain Loc --      Pain Edu? --      Excl. in [place]? --     Most recent vital signs: Vitals:   11/02/21 2337 11/02/21 2348  BP: (!) 152/97   Pulse:  68  Resp:    Temp:    SpO2:  96%    General: Awake, no distress.  CV:  Good peripheral perfusion.  Regular rate and rhythm  Resp:  Normal effort.  Equal breath sounds bilaterally.  Abd:  No distention.  Soft, nontender.  Obese.    ED Results / Procedures / Treatments    MEDICATIONS ORDERED IN ED: Medications  oxyCODONE-acetaminophen (PERCOCET/ROXICET) 5-325 MG per tablet 1 tablet (has no  administration in time range)     IMPRESSION / MDM / ASSESSMENT AND PLAN / ED COURSE  I reviewed the triage vital signs and the nursing notes.  Patient's presentation is most consistent with exacerbation of chronic illness.  Patient presents to the emergency department for continued left knee pain after a fall several months ago.  Patient was seen at [facility] emergency department in [place] over the last 2 days.  Patient states they called in pain medication for her but she was unable to get to the pharmacy in time so she came to the emergency department.  Here patient appears at her baseline.  She is well-known to the emergency department and myself.  She has no acute complaints.  Patient wishes to be discharged back to her [name].  We will dose 1 pain pill I will prescribe the patient MiraLAX to her pharmacy and she will follow-up with her doctor.  Patient agreeable to plan.  FINAL CLINICAL IMPRESSION(S) / ED DIAGNOSES   Chronic left knee pain Constipation  Rx / DC Orders  MiraLAX  Note:  This document was prepared using Dragon voice recognition software and may include unintentional dictation errors.   Harvest Dark, MD 11/03/21 [phone]

## 2022-06-03 IMAGING — DX DG FOOT 2V*R*
2 series · 2 of 2 positions shown · non-contrast
Comparison: None.

CLINICAL DATA: Gout, pain

EXAM:
RIGHT FOOT - 2 VIEW; LEFT FOOT - 2 VIEW

[foot ap]
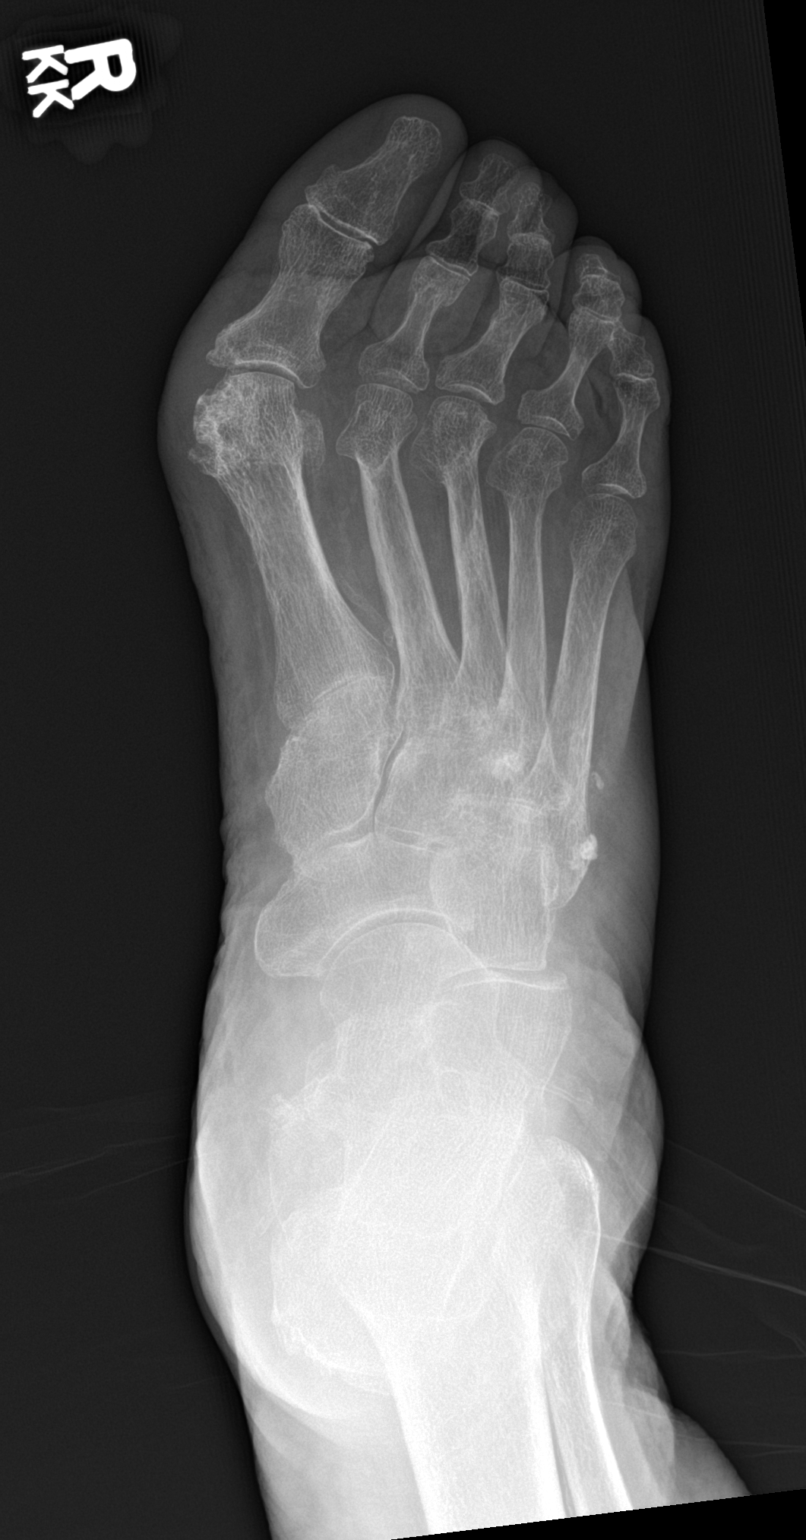

[foot lat]
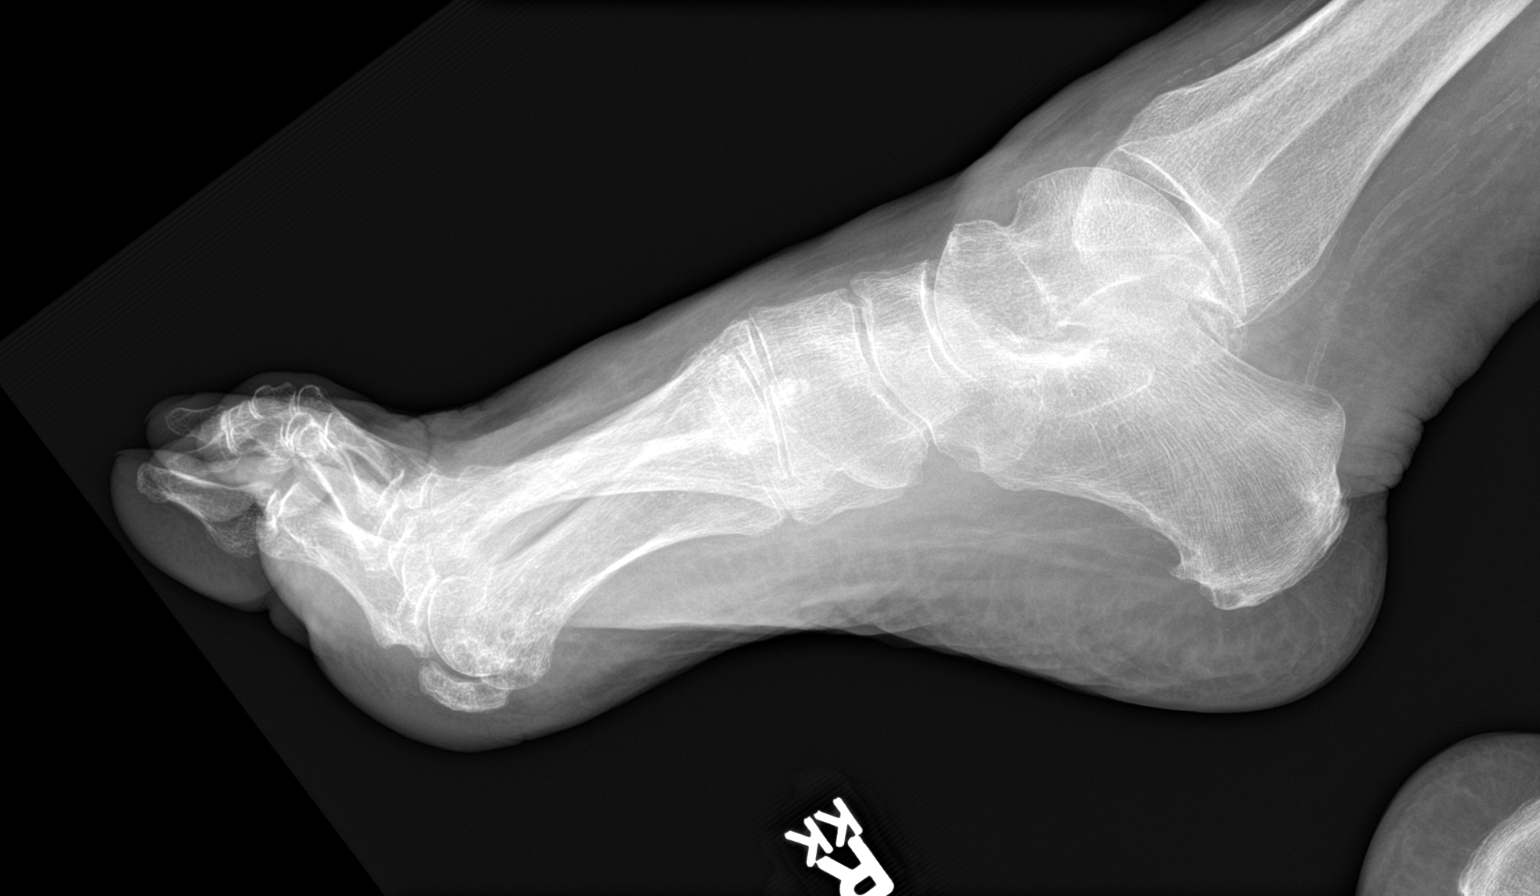

[2 of 2 positions shown; findings below may reference images not displayed]

FINDINGS: No fracture or dislocation of the bilateral feet. There is bony
erosion about the bilateral first metatarsophalangeal joints, worse
on the left. Mild bilateral first metatarsophalangeal and midfoot
arthrosis. Joint spaces are otherwise preserved. Diffuse soft tissue
edema about the feet.
IMPRESSION: 1. No fracture or dislocation of the bilateral feet.
2. Bony erosion about the bilateral first metatarsophalangeal
joints, worse on the left, in keeping with gouty arthropathy.
3. Mild bilateral first metatarsophalangeal and midfoot arthrosis.
Joint spaces are otherwise preserved.
4. Diffuse soft tissue edema about the feet.

## 2022-06-03 IMAGING — DX DG FOOT 2V*L*
2 series · 2 of 2 positions shown · non-contrast
Comparison: None.

CLINICAL DATA: Gout, pain

EXAM:
RIGHT FOOT - 2 VIEW; LEFT FOOT - 2 VIEW

[foot ap]
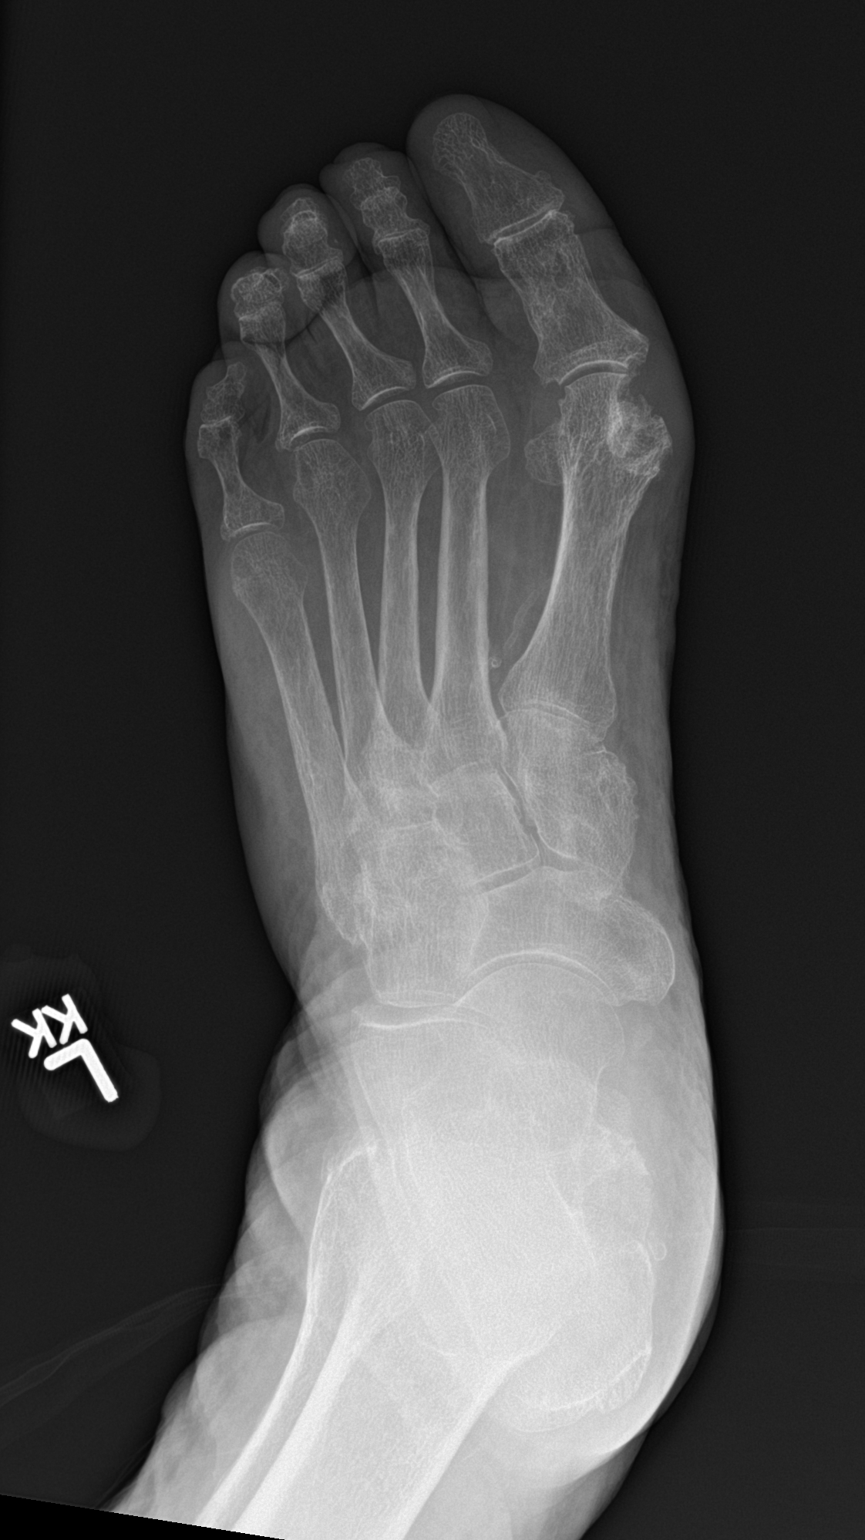

[foot lat]
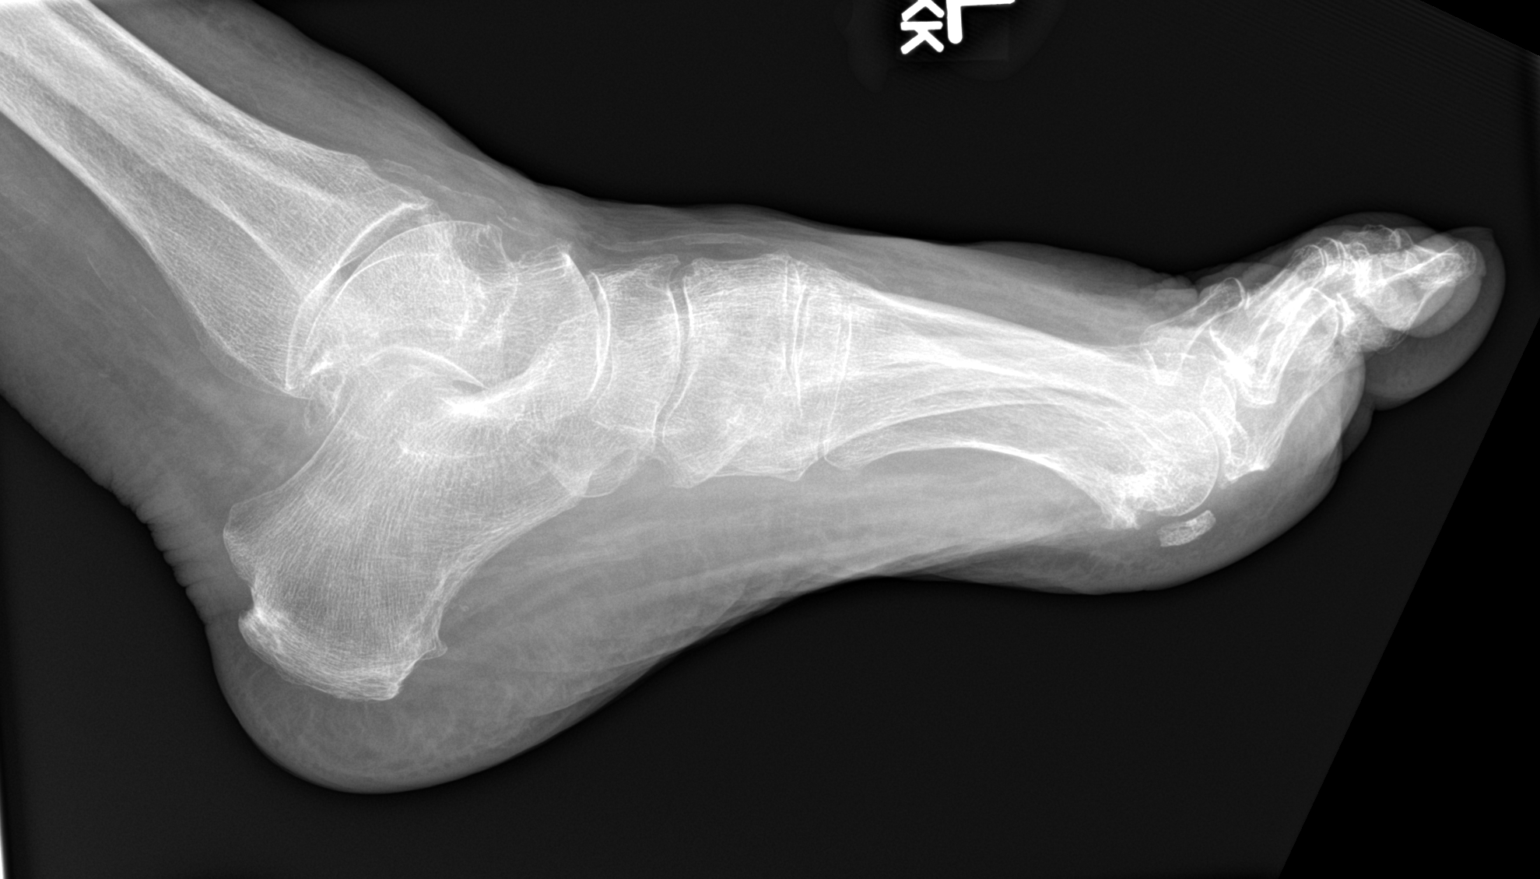

[2 of 2 positions shown; findings below may reference images not displayed]

FINDINGS: No fracture or dislocation of the bilateral feet. There is bony
erosion about the bilateral first metatarsophalangeal joints, worse
on the left. Mild bilateral first metatarsophalangeal and midfoot
arthrosis. Joint spaces are otherwise preserved. Diffuse soft tissue
edema about the feet.
IMPRESSION: 1. No fracture or dislocation of the bilateral feet.
2. Bony erosion about the bilateral first metatarsophalangeal
joints, worse on the left, in keeping with gouty arthropathy.
3. Mild bilateral first metatarsophalangeal and midfoot arthrosis.
Joint spaces are otherwise preserved.
4. Diffuse soft tissue edema about the feet.
